# Patient Record
Sex: Female | Born: 1935 | Race: Black or African American | Hispanic: No | Smoking: Never smoker
Health system: Southern US, Community
[De-identification: ages and names within clinical notes are randomized; demographics above are authoritative.]

## PROBLEM LIST (undated history)

## (undated) DIAGNOSIS — I10 Essential (primary) hypertension: Secondary | ICD-10-CM

## (undated) DIAGNOSIS — B9681 Helicobacter pylori [H. pylori] as the cause of diseases classified elsewhere: Secondary | ICD-10-CM

## (undated) DIAGNOSIS — R569 Unspecified convulsions: Secondary | ICD-10-CM

## (undated) DIAGNOSIS — S72002A Fracture of unspecified part of neck of left femur, initial encounter for closed fracture: Secondary | ICD-10-CM

## (undated) DIAGNOSIS — K2970 Gastritis, unspecified, without bleeding: Secondary | ICD-10-CM

## (undated) DIAGNOSIS — K219 Gastro-esophageal reflux disease without esophagitis: Secondary | ICD-10-CM

## (undated) DIAGNOSIS — K5790 Diverticulosis of intestine, part unspecified, without perforation or abscess without bleeding: Secondary | ICD-10-CM

## (undated) DIAGNOSIS — G2581 Restless legs syndrome: Secondary | ICD-10-CM

## (undated) DIAGNOSIS — E785 Hyperlipidemia, unspecified: Secondary | ICD-10-CM

## (undated) DIAGNOSIS — Q394 Esophageal web: Secondary | ICD-10-CM

## (undated) HISTORY — DX: Essential (primary) hypertension: I10

## (undated) HISTORY — DX: Gastro-esophageal reflux disease without esophagitis: K21.9

## (undated) HISTORY — DX: Unspecified convulsions: R56.9

## (undated) HISTORY — DX: Diverticulosis of intestine, part unspecified, without perforation or abscess without bleeding: K57.90

## (undated) HISTORY — DX: Restless legs syndrome: G25.81

## (undated) HISTORY — DX: Hyperlipidemia, unspecified: E78.5

## (undated) HISTORY — DX: Esophageal web: Q39.4

## (undated) HISTORY — PX: ABDOMINAL HYSTERECTOMY: SHX81

## (undated) HISTORY — DX: Gastritis, unspecified, without bleeding: K29.70

## (undated) HISTORY — DX: Helicobacter pylori (H. pylori) as the cause of diseases classified elsewhere: B96.81

---

## 1964-10-08 HISTORY — PX: VESICOVAGINAL FISTULA CLOSURE W/ TAH: SUR271

## 2001-03-03 ENCOUNTER — Encounter: Payer: Self-pay | Admitting: Family Medicine

## 2001-03-03 ENCOUNTER — Ambulatory Visit (HOSPITAL_COMMUNITY): Admission: RE | Admit: 2001-03-03 | Discharge: 2001-03-03 | Payer: Self-pay | Admitting: Family Medicine

## 2001-03-06 ENCOUNTER — Other Ambulatory Visit: Admission: RE | Admit: 2001-03-06 | Discharge: 2001-03-06 | Payer: Self-pay | Admitting: Obstetrics and Gynecology

## 2002-03-06 ENCOUNTER — Encounter: Payer: Self-pay | Admitting: Internal Medicine

## 2002-03-06 ENCOUNTER — Ambulatory Visit (HOSPITAL_COMMUNITY): Admission: RE | Admit: 2002-03-06 | Discharge: 2002-03-06 | Payer: Self-pay | Admitting: Internal Medicine

## 2003-03-12 ENCOUNTER — Ambulatory Visit (HOSPITAL_COMMUNITY): Admission: RE | Admit: 2003-03-12 | Discharge: 2003-03-12 | Payer: Self-pay | Admitting: Internal Medicine

## 2003-03-12 ENCOUNTER — Encounter: Payer: Self-pay | Admitting: Internal Medicine

## 2004-02-02 ENCOUNTER — Ambulatory Visit (HOSPITAL_COMMUNITY): Admission: RE | Admit: 2004-02-02 | Discharge: 2004-02-02 | Payer: Self-pay | Admitting: Internal Medicine

## 2004-03-13 ENCOUNTER — Ambulatory Visit (HOSPITAL_COMMUNITY): Admission: RE | Admit: 2004-03-13 | Discharge: 2004-03-13 | Payer: Self-pay | Admitting: Internal Medicine

## 2005-04-20 ENCOUNTER — Ambulatory Visit (HOSPITAL_COMMUNITY): Admission: RE | Admit: 2005-04-20 | Discharge: 2005-04-20 | Payer: Self-pay | Admitting: Internal Medicine

## 2005-09-06 ENCOUNTER — Ambulatory Visit (HOSPITAL_COMMUNITY): Admission: RE | Admit: 2005-09-06 | Discharge: 2005-09-06 | Payer: Self-pay | Admitting: Internal Medicine

## 2006-04-26 ENCOUNTER — Ambulatory Visit (HOSPITAL_COMMUNITY): Admission: RE | Admit: 2006-04-26 | Discharge: 2006-04-26 | Payer: Self-pay | Admitting: Internal Medicine

## 2007-05-02 ENCOUNTER — Ambulatory Visit (HOSPITAL_COMMUNITY): Admission: RE | Admit: 2007-05-02 | Discharge: 2007-05-02 | Payer: Self-pay | Admitting: Internal Medicine

## 2007-10-09 ENCOUNTER — Encounter: Payer: Self-pay | Admitting: Family Medicine

## 2008-05-07 ENCOUNTER — Ambulatory Visit (HOSPITAL_COMMUNITY): Admission: RE | Admit: 2008-05-07 | Discharge: 2008-05-07 | Payer: Self-pay | Admitting: Internal Medicine

## 2008-10-08 DIAGNOSIS — R569 Unspecified convulsions: Secondary | ICD-10-CM

## 2008-10-08 DIAGNOSIS — B9681 Helicobacter pylori [H. pylori] as the cause of diseases classified elsewhere: Secondary | ICD-10-CM

## 2008-10-08 DIAGNOSIS — Q394 Esophageal web: Secondary | ICD-10-CM

## 2008-10-08 DIAGNOSIS — K2970 Gastritis, unspecified, without bleeding: Secondary | ICD-10-CM

## 2008-10-08 HISTORY — DX: Gastritis, unspecified, without bleeding: K29.70

## 2008-10-08 HISTORY — DX: Helicobacter pylori (H. pylori) as the cause of diseases classified elsewhere: B96.81

## 2008-10-08 HISTORY — DX: Unspecified convulsions: R56.9

## 2008-10-08 HISTORY — DX: Esophageal web: Q39.4

## 2008-10-13 ENCOUNTER — Emergency Department (HOSPITAL_COMMUNITY): Admission: EM | Admit: 2008-10-13 | Discharge: 2008-10-13 | Payer: Self-pay | Admitting: Emergency Medicine

## 2008-11-05 ENCOUNTER — Encounter (INDEPENDENT_AMBULATORY_CARE_PROVIDER_SITE_OTHER): Payer: Self-pay | Admitting: Internal Medicine

## 2008-11-05 ENCOUNTER — Ambulatory Visit (HOSPITAL_COMMUNITY): Admission: RE | Admit: 2008-11-05 | Discharge: 2008-11-05 | Payer: Self-pay | Admitting: Internal Medicine

## 2008-11-05 ENCOUNTER — Ambulatory Visit: Payer: Self-pay | Admitting: Cardiology

## 2008-11-08 ENCOUNTER — Ambulatory Visit (HOSPITAL_COMMUNITY): Admission: RE | Admit: 2008-11-08 | Discharge: 2008-11-08 | Payer: Self-pay | Admitting: Internal Medicine

## 2008-11-10 ENCOUNTER — Ambulatory Visit (HOSPITAL_COMMUNITY): Admission: RE | Admit: 2008-11-10 | Discharge: 2008-11-10 | Payer: Self-pay | Admitting: Preventative Medicine

## 2009-01-05 ENCOUNTER — Emergency Department (HOSPITAL_COMMUNITY): Admission: EM | Admit: 2009-01-05 | Discharge: 2009-01-05 | Payer: Self-pay | Admitting: Emergency Medicine

## 2009-01-24 ENCOUNTER — Emergency Department (HOSPITAL_COMMUNITY): Admission: EM | Admit: 2009-01-24 | Discharge: 2009-01-24 | Payer: Self-pay | Admitting: Emergency Medicine

## 2009-03-09 ENCOUNTER — Emergency Department (HOSPITAL_COMMUNITY): Admission: EM | Admit: 2009-03-09 | Discharge: 2009-03-09 | Payer: Self-pay | Admitting: Emergency Medicine

## 2009-03-10 ENCOUNTER — Encounter (INDEPENDENT_AMBULATORY_CARE_PROVIDER_SITE_OTHER): Payer: Self-pay | Admitting: *Deleted

## 2009-05-03 ENCOUNTER — Ambulatory Visit: Payer: Self-pay | Admitting: Gastroenterology

## 2009-05-08 HISTORY — PX: UPPER GASTROINTESTINAL ENDOSCOPY: SHX188

## 2009-05-13 ENCOUNTER — Encounter: Payer: Self-pay | Admitting: Gastroenterology

## 2009-05-13 ENCOUNTER — Ambulatory Visit: Payer: Self-pay | Admitting: Gastroenterology

## 2009-05-13 ENCOUNTER — Ambulatory Visit (HOSPITAL_COMMUNITY): Admission: RE | Admit: 2009-05-13 | Discharge: 2009-05-13 | Payer: Self-pay | Admitting: Gastroenterology

## 2009-05-25 ENCOUNTER — Encounter: Payer: Self-pay | Admitting: Family Medicine

## 2009-05-26 ENCOUNTER — Telehealth: Payer: Self-pay | Admitting: Gastroenterology

## 2009-06-01 ENCOUNTER — Encounter: Payer: Self-pay | Admitting: Family Medicine

## 2009-06-01 ENCOUNTER — Ambulatory Visit (HOSPITAL_COMMUNITY): Admission: RE | Admit: 2009-06-01 | Discharge: 2009-06-01 | Payer: Self-pay | Admitting: Internal Medicine

## 2009-06-06 ENCOUNTER — Telehealth (INDEPENDENT_AMBULATORY_CARE_PROVIDER_SITE_OTHER): Payer: Self-pay

## 2009-06-09 ENCOUNTER — Ambulatory Visit: Payer: Self-pay | Admitting: Family Medicine

## 2009-06-09 DIAGNOSIS — R5383 Other fatigue: Secondary | ICD-10-CM

## 2009-06-09 DIAGNOSIS — F418 Other specified anxiety disorders: Secondary | ICD-10-CM | POA: Insufficient documentation

## 2009-06-09 DIAGNOSIS — R5381 Other malaise: Secondary | ICD-10-CM | POA: Insufficient documentation

## 2009-06-09 DIAGNOSIS — E785 Hyperlipidemia, unspecified: Secondary | ICD-10-CM | POA: Insufficient documentation

## 2009-06-09 DIAGNOSIS — I10 Essential (primary) hypertension: Secondary | ICD-10-CM | POA: Insufficient documentation

## 2009-06-11 DIAGNOSIS — G40909 Epilepsy, unspecified, not intractable, without status epilepticus: Secondary | ICD-10-CM | POA: Insufficient documentation

## 2009-06-14 ENCOUNTER — Encounter: Payer: Self-pay | Admitting: Family Medicine

## 2009-06-14 LAB — CONVERTED CEMR LAB
ALT: 15 units/L (ref 0–35)
AST: 21 units/L (ref 0–37)
Albumin: 4.2 g/dL (ref 3.5–5.2)
BUN: 14 mg/dL (ref 6–23)
Basophils Absolute: 0 10*3/uL (ref 0.0–0.1)
Basophils Relative: 1 % (ref 0–1)
Bilirubin, Direct: 0.1 mg/dL (ref 0.0–0.3)
Creatinine, Ser: 0.67 mg/dL (ref 0.40–1.20)
Glucose, Bld: 93 mg/dL (ref 70–99)
HDL: 57 mg/dL (ref >39)
Indirect Bilirubin: 0.3 mg/dL (ref 0.0–0.9)
LDL Cholesterol: 135 mg/dL — ABNORMAL HIGH (ref 0–99)
Lymphs Abs: 2 10*3/uL (ref 0.7–4.0)
MCHC: 32 g/dL (ref 30.0–36.0)
Platelets: 210 10*3/uL (ref 150–400)
Potassium: 4.5 meq/L (ref 3.5–5.3)
RBC: 4.44 M/uL (ref 3.87–5.11)
RDW: 15 % (ref 11.5–15.5)
Total Bilirubin: 0.4 mg/dL (ref 0.3–1.2)
Total CHOL/HDL Ratio: 3.6
WBC: 5.1 10*3/uL (ref 4.0–10.5)

## 2009-06-16 ENCOUNTER — Telehealth: Payer: Self-pay | Admitting: Family Medicine

## 2009-07-05 ENCOUNTER — Encounter: Payer: Self-pay | Admitting: Family Medicine

## 2009-07-07 ENCOUNTER — Encounter: Payer: Self-pay | Admitting: Family Medicine

## 2009-07-08 HISTORY — PX: COLONOSCOPY: SHX174

## 2009-07-13 ENCOUNTER — Ambulatory Visit: Payer: Self-pay | Admitting: Gastroenterology

## 2009-07-13 DIAGNOSIS — K5909 Other constipation: Secondary | ICD-10-CM | POA: Insufficient documentation

## 2009-07-13 DIAGNOSIS — K2990 Gastroduodenitis, unspecified, without bleeding: Secondary | ICD-10-CM

## 2009-07-13 DIAGNOSIS — K2970 Gastritis, unspecified, without bleeding: Secondary | ICD-10-CM | POA: Insufficient documentation

## 2009-07-20 ENCOUNTER — Ambulatory Visit: Payer: Self-pay | Admitting: Family Medicine

## 2009-07-20 ENCOUNTER — Encounter: Payer: Self-pay | Admitting: Family Medicine

## 2009-07-20 ENCOUNTER — Other Ambulatory Visit: Admission: RE | Admit: 2009-07-20 | Discharge: 2009-07-20 | Payer: Self-pay | Admitting: Family Medicine

## 2009-07-21 ENCOUNTER — Encounter: Payer: Self-pay | Admitting: Family Medicine

## 2009-07-25 ENCOUNTER — Ambulatory Visit (HOSPITAL_COMMUNITY): Admission: RE | Admit: 2009-07-25 | Discharge: 2009-07-25 | Payer: Self-pay | Admitting: Gastroenterology

## 2009-07-25 ENCOUNTER — Ambulatory Visit: Payer: Self-pay | Admitting: Gastroenterology

## 2009-07-25 ENCOUNTER — Encounter: Payer: Self-pay | Admitting: Gastroenterology

## 2009-07-27 ENCOUNTER — Ambulatory Visit (HOSPITAL_COMMUNITY): Admission: RE | Admit: 2009-07-27 | Discharge: 2009-07-27 | Payer: Self-pay | Admitting: Family Medicine

## 2009-08-10 ENCOUNTER — Encounter: Payer: Self-pay | Admitting: Family Medicine

## 2009-10-21 ENCOUNTER — Ambulatory Visit: Payer: Self-pay | Admitting: Family Medicine

## 2009-10-21 DIAGNOSIS — L259 Unspecified contact dermatitis, unspecified cause: Secondary | ICD-10-CM | POA: Insufficient documentation

## 2009-11-23 ENCOUNTER — Ambulatory Visit: Payer: Self-pay | Admitting: Family Medicine

## 2009-11-28 ENCOUNTER — Encounter: Payer: Self-pay | Admitting: Family Medicine

## 2009-11-28 LAB — CONVERTED CEMR LAB
Albumin: 4.3 g/dL (ref 3.5–5.2)
Alkaline Phosphatase: 55 units/L (ref 39–117)
BUN: 16 mg/dL (ref 6–23)
Bilirubin, Direct: 0.1 mg/dL (ref 0.0–0.3)
CO2: 29 meq/L (ref 19–32)
LDL Cholesterol: 77 mg/dL (ref 0–99)
Potassium: 3.8 meq/L (ref 3.5–5.3)
Total Bilirubin: 0.4 mg/dL (ref 0.3–1.2)
Total CHOL/HDL Ratio: 2.5
Triglycerides: 38 mg/dL (ref <150)

## 2009-12-01 ENCOUNTER — Telehealth: Payer: Self-pay | Admitting: Family Medicine

## 2010-01-02 ENCOUNTER — Telehealth: Payer: Self-pay | Admitting: Family Medicine

## 2010-01-11 ENCOUNTER — Ambulatory Visit: Payer: Self-pay | Admitting: Gastroenterology

## 2010-01-13 DIAGNOSIS — K219 Gastro-esophageal reflux disease without esophagitis: Secondary | ICD-10-CM | POA: Insufficient documentation

## 2010-03-10 ENCOUNTER — Telehealth: Payer: Self-pay | Admitting: Family Medicine

## 2010-03-29 ENCOUNTER — Ambulatory Visit: Payer: Self-pay | Admitting: Family Medicine

## 2010-06-02 ENCOUNTER — Ambulatory Visit (HOSPITAL_COMMUNITY)
Admission: RE | Admit: 2010-06-02 | Discharge: 2010-06-02 | Payer: Self-pay | Source: Home / Self Care | Admitting: *Deleted

## 2010-06-05 ENCOUNTER — Telehealth: Payer: Self-pay | Admitting: Family Medicine

## 2010-06-06 ENCOUNTER — Telehealth: Payer: Self-pay | Admitting: Family Medicine

## 2010-06-14 ENCOUNTER — Ambulatory Visit: Payer: Self-pay | Admitting: Family Medicine

## 2010-06-22 ENCOUNTER — Ambulatory Visit: Payer: Self-pay | Admitting: Family Medicine

## 2010-07-05 ENCOUNTER — Telehealth: Payer: Self-pay | Admitting: Family Medicine

## 2010-08-01 ENCOUNTER — Telehealth: Payer: Self-pay | Admitting: Family Medicine

## 2010-08-02 ENCOUNTER — Telehealth: Payer: Self-pay | Admitting: Family Medicine

## 2010-08-03 ENCOUNTER — Telehealth: Payer: Self-pay | Admitting: Family Medicine

## 2010-08-03 ENCOUNTER — Ambulatory Visit: Payer: Self-pay | Admitting: Family Medicine

## 2010-08-03 DIAGNOSIS — F429 Obsessive-compulsive disorder, unspecified: Secondary | ICD-10-CM | POA: Insufficient documentation

## 2010-08-03 DIAGNOSIS — G4700 Insomnia, unspecified: Secondary | ICD-10-CM | POA: Insufficient documentation

## 2010-08-23 ENCOUNTER — Ambulatory Visit (HOSPITAL_COMMUNITY): Payer: Self-pay | Admitting: Psychiatry

## 2010-08-28 ENCOUNTER — Telehealth: Payer: Self-pay | Admitting: Family Medicine

## 2010-08-29 ENCOUNTER — Ambulatory Visit: Payer: Self-pay | Admitting: Family Medicine

## 2010-08-29 ENCOUNTER — Telehealth: Payer: Self-pay | Admitting: Family Medicine

## 2010-09-06 ENCOUNTER — Ambulatory Visit (HOSPITAL_COMMUNITY): Payer: Self-pay | Admitting: Psychiatry

## 2010-09-20 ENCOUNTER — Ambulatory Visit (HOSPITAL_COMMUNITY): Payer: Self-pay | Admitting: Psychiatry

## 2010-10-06 ENCOUNTER — Ambulatory Visit (HOSPITAL_COMMUNITY): Payer: Self-pay | Admitting: Psychiatry

## 2010-10-20 ENCOUNTER — Ambulatory Visit (HOSPITAL_COMMUNITY)
Admission: RE | Admit: 2010-10-20 | Discharge: 2010-10-20 | Payer: Self-pay | Source: Home / Self Care | Attending: Psychiatry | Admitting: Psychiatry

## 2010-10-25 ENCOUNTER — Telehealth: Payer: Self-pay | Admitting: Family Medicine

## 2010-10-25 ENCOUNTER — Ambulatory Visit
Admission: RE | Admit: 2010-10-25 | Discharge: 2010-10-25 | Payer: Self-pay | Source: Home / Self Care | Attending: Family Medicine | Admitting: Family Medicine

## 2010-10-30 ENCOUNTER — Encounter: Payer: Self-pay | Admitting: General Surgery

## 2010-11-03 ENCOUNTER — Ambulatory Visit (HOSPITAL_COMMUNITY)
Admission: RE | Admit: 2010-11-03 | Discharge: 2010-11-03 | Payer: Self-pay | Source: Home / Self Care | Attending: Psychiatry | Admitting: Psychiatry

## 2010-11-03 LAB — CONVERTED CEMR LAB
AST: 24 units/L (ref 0–37)
Alkaline Phosphatase: 67 units/L (ref 39–117)
Calcium: 9.4 mg/dL (ref 8.4–10.5)
Cholesterol: 146 mg/dL (ref 0–200)
Eosinophils Absolute: 0 10*3/uL (ref 0.0–0.7)
Hemoglobin: 12.8 g/dL (ref 12.0–15.0)
Indirect Bilirubin: 0.4 mg/dL (ref 0.0–0.9)
Lymphocytes Relative: 25 % (ref 12–46)
Lymphs Abs: 1.7 10*3/uL (ref 0.7–4.0)
MCHC: 32.6 g/dL (ref 30.0–36.0)
Monocytes Relative: 8 % (ref 3–12)
RDW: 14.4 % (ref 11.5–15.5)
Total CHOL/HDL Ratio: 2.2
WBC: 6.9 10*3/uL (ref 4.0–10.5)

## 2010-11-05 DIAGNOSIS — M25569 Pain in unspecified knee: Secondary | ICD-10-CM | POA: Insufficient documentation

## 2010-11-07 ENCOUNTER — Ambulatory Visit (HOSPITAL_COMMUNITY): Admit: 2010-11-07 | Payer: Self-pay | Admitting: Psychiatry

## 2010-11-07 NOTE — Letter (Signed)
Summary: consults  consults   Imported By: Lind Guest 02/22/2010 16:35:33  _____________________________________________________________________  External Attachment:    Type:   Image     Comment:   External Document

## 2010-11-07 NOTE — Progress Notes (Signed)
Summary: MED  Phone Note Call from Patient   Summary of Call: HAS THROWED HER [facility] MEDICINE IN THE [place] CALL BACK AT [phone] Initial call taken by: Lind Guest,  August 02, 2010 8:20 AM  Follow-up for Phone Call        patient states that when she called [place] she also called the drug store and they sent her some more medication out. Follow-up by: Curtis Sites,  August 02, 2010 11:26 AM  Additional Follow-up for Phone Call Additional follow up Details #1::        noted Additional Follow-up by: Adella Hare LPN,  August 02, 2010 11:32 AM

## 2010-11-07 NOTE — Assessment & Plan Note (Signed)
Summary: bp ck  Nurse Visit   Vital Signs:  Patient profile:   77 year old female Menstrual status:  hysterectomy Weight:      174.75 pounds BP sitting:   124 / 80  (left arm)  Vitals Entered By: Adella Hare LPN (August 29, 2010 9:27 AM)  Allergies: No Known Drug Allergies pt reassured of normal BP

## 2010-11-07 NOTE — Progress Notes (Signed)
Summary: refill  Phone Note Call from Patient   Summary of Call: needs a refill on xanax [facility] [phone] Initial call taken by: Rudene Anda,  June 05, 2010 8:35 AM  Follow-up for Phone Call        Rx Called In Follow-up by: Adella Hare LPN,  June 05, 2010 8:45 AM    Prescriptions: ALPRAZOLAM 0.25 MG TABS (ALPRAZOLAM) once daily as needed  #30 x 1   Entered by:   Adella Hare LPN   Authorized by:   Syliva Overman MD   Signed by:   Adella Hare LPN on 45/40/9811   Method used:   Printed then faxed to ...       [facility] (retail)       [address]       [place], [place]  [address]       Ph: [phone]       Fax: [phone]   RxID:   [id number]

## 2010-11-07 NOTE — Progress Notes (Signed)
Summary: lovastatin  Phone Note Call from Patient   Summary of Call: patient would like her Lovastatin 40 mg called into [place], she will be out of this medication today.  thanks Initial call taken by: Curtis Sites,  August 28, 2010 2:39 PM  Follow-up for Phone Call        Rx Called In Follow-up by: Adella Hare LPN,  August 29, 2010 2:44 PM

## 2010-11-07 NOTE — Assessment & Plan Note (Signed)
Summary: RASH   Vital Signs:  Patient profile:   77 year old female Menstrual status:  hysterectomy Height:      69 inches Weight:      180.50 pounds BMI:     26.75 O2 Sat:      97 % Pulse rate:   94 / minute Pulse rhythm:   regular Resp:     16 per minute BP sitting:   120 / 80 Cuff size:   regular  Vitals Entered By: Everitt Amber (October 21, 2009 9:50 AM)  Nutrition Counseling: Patient's BMI is greater than 25 and therefore counseled on weight management options. CC: noticed a red dry rash lastweek under her left arm, not itching but looks like its spreading   Primary Care Provider:  Lodema Hong, M.D.  CC:  noticed a red dry rash lastweek under her left arm and not itching but looks like its spreading.  History of Present Illness: Non  puritic rash under left arm for the pasrt 1 week. No change in deodorant or soap. No other skin problem noted No fever or chills. otherwise she has no complaints and reports that she has been doing really well.  Denies sinus pressure, nasal congestion , ear pain or sore throat. Denies chest congestion, or cough productive of sputum. Denies chest pain, palpitations, PND, orthopnea or leg swelling. Denies abdominal pain, nausea, vomitting, diarrhea or constipation. Denies change in bowel movements or bloody stool. Denies dysuria , frequency, incontinence or hesitancy. Reports chronic  joint pain, and  reduced mobility. Denies headaches, vertigo, seizures. Denies depression, anxiety or insomnia.improved on meds.    Current Medications (verified): 1)  Alprazolam 0.25 Mg Tabs (Alprazolam) .... Once Daily As Needed 2)  Diltiazem Hcl 120 Mg Tabs (Diltiazem Hcl) .... Once Daily 3)  Aspirin 81 Mg Tbec (Aspirin) .... Once Daily 4)  Vitamin D 50,000 Units .... Q Week 5)  Levetiracetam 500 Mg Tabs (Levetiracetam) .... Two Times A Day 6)  Omeprazole 20 Mg Tbec (Omeprazole) .... One By Mouth Daily 7)  Fluoxetine Hcl 10 Mg Caps (Fluoxetine Hcl) ....  Take 1 Capsule By Mouth Once A Day 8)  Lovastatin 40 Mg Tabs (Lovastatin) .... Take Two Tabs By Mouth Qhs 9)  Oscal 500/200 D-3 500-200 Mg-Unit Tabs (Calcium-Vitamin D) .... One Tab By Mouth Tid 10)  Multivitamins  Tabs (Multiple Vitamin) .... One Tab By Mouth Qd  Allergies (verified): No Known Drug Allergies  Review of Systems      See HPI MS:  Complains of joint pain and stiffness; ambulates with a cane. Neuro:  Denies falling down, headaches, seizures, and sensation of room spinning. Heme:  Denies abnormal bruising and bleeding. Allergy:  Denies hives or rash and sneezing.  Physical Exam  General:  Well-developed,well-nourished,in no acute distress; alert,appropriate and cooperative throughout examination HEENT: No facial asymmetry,  EOMI, No sinus tenderness, TM's Clear, oropharynx  pink and moist.   Chest: Clear to auscultation bilaterally.  CVS: S1, S2, No murmurs, No S3.   Abd: Soft, Nontender.  MS: decreased ROM spine, hips, shoulders and knees. Ambulates with cane Ext: No edema.   CNS: CN 2-12 intact,decreased  powe and  tonein lower ext  Skin: Intact,macular scaly rash in left axilla, hypopigmented Psych: Good eye contact, normal affect.  Memory intact, not anxious or depressed appearing.    Impression & Recommendations:  Problem # 1:  DERMATITIS (ICD-692.9) Assessment Comment Only betamethasone/clotrimazole twice daily, and fluconazole  and terbinaine prescribed  Problem # 2:  SEIZURE DISORDER (  ICD-780.39) Assessment: Improved  Her updated medication list for this problem includes:    Levetiracetam 500 Mg Tabs (Levetiracetam) .Marland Kitchen..Marland Kitchen Two times a day  Problem # 3:  DEPRESSION (ICD-311) Assessment: Improved  Her updated medication list for this problem includes:    Alprazolam 0.25 Mg Tabs (Alprazolam) ..... Once daily as needed    Fluoxetine Hcl 10 Mg Caps (Fluoxetine hcl) .Marland Kitchen... Take 1 capsule by mouth once a day  Problem # 4:  ESSENTIAL HYPERTENSION  (ICD-401.9) Assessment: Improved  Her updated medication list for this problem includes:    Diltiazem Hcl 120 Mg Tabs (Diltiazem hcl) ..... Once daily  BP today: 120/80 Prior BP: 140/80 (07/20/2009)  Labs Reviewed: K+: 4.5 (06/09/2009) Creat: : 0.67 (06/09/2009)   Chol: 205 (06/09/2009)   HDL: 57 (06/09/2009)   LDL: 135 (06/09/2009)   TG: 67 (06/09/2009)  Complete Medication List: 1)  Alprazolam 0.25 Mg Tabs (Alprazolam) .... Once daily as needed 2)  Diltiazem Hcl 120 Mg Tabs (Diltiazem hcl) .... Once daily 3)  Aspirin 81 Mg Tbec (Aspirin) .... Once daily 4)  Vitamin D 50,000 Units  .... Q week 5)  Levetiracetam 500 Mg Tabs (Levetiracetam) .... Two times a day 6)  Omeprazole 20 Mg Tbec (Omeprazole) .... One by mouth daily 7)  Fluoxetine Hcl 10 Mg Caps (Fluoxetine hcl) .... Take 1 capsule by mouth once a day 8)  Lovastatin 40 Mg Tabs (Lovastatin) .... Take two tabs by mouth qhs 9)  Oscal 500/200 D-3 500-200 Mg-unit Tabs (Calcium-vitamin d) .... One tab by mouth tid 10)  Multivitamins Tabs (Multiple vitamin) .... One tab by mouth qd 11)  Clotrimazole-betamethasone 1-0.05 % Crea (Clotrimazole-betamethasone) .... Apply to rash in left armpit twice daily for 2 weeks , then as needed 12)  Terbinafine Hcl 250 Mg Tabs (Terbinafine hcl) .... Take 1 tablet by mouth once a day 13)  Fluconazole 150 Mg Tabs (Fluconazole) .... Take 1 tablet by mouth once a day  Patient Instructions: 1)  F/U as before. 2)  You are being treated for ecczema and skin infection. Pls continue to wash with Lever 2000, andonly use the prescribed cream on the rash. 3)  Tablets are also sent in.  Prescriptions: FLUOXETINE HCL 10 MG CAPS (FLUOXETINE HCL) Take 1 capsule by mouth once a day  #30 x 3   Entered by:   Everitt Amber   Authorized by:   Syliva Overman MD   Signed by:   Everitt Amber on 10/21/2009   Method used:   Electronically to        [facility] (retail)       [address] [place], [place]  [address]       Ph: [phone]       Fax: [phone]   RxID:   [id number] FLUCONAZOLE 150 MG TABS (FLUCONAZOLE) Take 1 tablet by mouth once a day  #3 x 0   Entered by:   Everitt Amber   Authorized by:   Syliva Overman MD   Signed by:   Everitt Amber on 10/21/2009   Method used:   Electronically to        [facility] (retail)       [address] [place], [place]  [address]       Ph: [phone]       Fax:  [phone]   RxID:   [id number] TERBINAFINE HCL 250 MG TABS (TERBINAFINE HCL) Take 1 tablet by mouth once a day  #14 x 0   Entered by:   Everitt Amber   Authorized by:   Syliva Overman MD   Signed by:   Everitt Amber on 10/21/2009   Method used:   Electronically to        [facility] (retail)       [address]       [place], [place]  [address]       Ph: [phone]       Fax: [phone]   RxID:   [id number] CLOTRIMAZOLE-BETAMETHASONE 1-0.05 % CREA (CLOTRIMAZOLE-BETAMETHASONE) apply to rash in left armpit twice daily for 2 weeks , then as needed  #45gm x 0   Entered by:   Everitt Amber   Authorized by:   Syliva Overman MD   Signed by:   Everitt Amber on 10/21/2009   Method used:   Electronically to        [facility] (retail)       [address] [place], [place]  [address]       Ph: [phone]       Fax: [phone]   RxID:   [id number]

## 2010-11-07 NOTE — Assessment & Plan Note (Signed)
Summary: Constipation, Gastritis   Visit Type:  Follow-up Visit Primary Care Provider:  Lodema Hong, M.D.   Chief Complaint:  follow up- doing ok.  History of Present Illness: Taking Miralax and fiber. BMs: just about every day or every other day. Pt can eat anything she wants.   Current Medications (verified): 1)  Alprazolam 0.25 Mg Tabs (Alprazolam) .... Once Daily As Needed 2)  Diltiazem Hcl 120 Mg Tabs (Diltiazem Hcl) .... Once Daily 3)  Aspirin 81 Mg Tbec (Aspirin) .... Once Daily 4)  Vitamin D 50,000 Units .... Q Week 5)  Levetiracetam 500 Mg Tabs (Levetiracetam) .... Two Times A Day 6)  Omeprazole 20 Mg Tbec (Omeprazole) .... One By Mouth Daily 7)  Fluoxetine Hcl 10 Mg Caps (Fluoxetine Hcl) .... Take 1 Capsule By Mouth Once A Day 8)  Lovastatin 40 Mg Tabs (Lovastatin) .... Take Two Tabs By Mouth Qhs 9)  Oscal 500/200 D-3 500-200 Mg-Unit Tabs (Calcium-Vitamin D) .... One Tab By Mouth Tid 10)  Multivitamins  Tabs (Multiple Vitamin) .... One Tab By Mouth Qd  Allergies (verified): No Known Drug Allergies  Past History:  Past Medical History: Last updated: 07/13/2009 Hyperlipidemia   approx 2001 Hypertension  proir to 1990 Seizures: since 2010 after falling in January, was running into the house to turn off te stove, fell and hit head since  then she developed seizures, followed by Dr. Gerilyn Pilgrim for this Restless legs syndrome  since 1999 GERD EGD/dilation to 16 mm AUG 2010 H. PYLORI GASTRITIS 2010, RX: ABO for 10 days  Vital Signs:  Patient profile:   77 year old female Menstrual status:  hysterectomy Height:      69 inches Weight:      179 pounds BMI:     26.53 Temp:     98.0 degrees F oral Pulse rate:   72 / minute BP sitting:   138 / 78  (left arm) Cuff size:   regular  Vitals Entered By: Hendricks Limes LPN (January 11, 1609 2:44 PM)  Physical Exam  General:  Well developed, well nourished, no acute distress. Head:  Normocephalic and atraumatic. Lungs:  Clear  throughout to auscultation. Heart:  Regular rate and rhythm; no murmurs. Abdomen:  Soft, nontender and nondistended. Normal bowel sounds.  Impression & Recommendations:  Problem # 1:  CONSTIPATION (ICD-564.00) Assessment Improved  CONTINUE FIBER AND WATER. OPV in 12 mos.  Orders: Est. Patient Level II ([address])  Problem # 2:  GASTRITIS (ICD-535.50) Assessment: Improved Continue OMP.  CC: PCP

## 2010-11-07 NOTE — Progress Notes (Signed)
Summary: MEDICATION  Phone Note Call from Patient   Summary of Call: PATIENT CALLED IN REGARDING MEDICATION STATES THAT MEDICATION IS NOT READY PHARMACY IS [facility] NEED TO TAKE IT TONIGHT BEFORE BED TIME.Marland KitchenMEDICATION IS LOVASTATIN 40MG  TABS Initial call taken by: Eugenio Hoes,  August 28, 2010 3:26 PM  Follow-up for Phone Call        Rx Called In Follow-up by: Adella Hare LPN,  August 29, 2010 2:44 PM

## 2010-11-07 NOTE — Letter (Signed)
Summary: labs  labs   Imported By: Lind Guest 02/22/2010 16:36:21  _____________________________________________________________________  External Attachment:    Type:   Image     Comment:   External Document

## 2010-11-07 NOTE — Progress Notes (Signed)
Summary: MUTLI VITAMIN  Phone Note Call from Patient   Summary of Call: NEEDS HER MULITI VITAMIN SENT TO [facility] Initial call taken by: Lind Guest,  August 29, 2010 11:06 AM    Prescriptions: MULTIVITAMINS  TABS (MULTIPLE VITAMIN) one tab by mouth once daily.  #30 x 1   Entered by:   Adella Hare LPN   Authorized by:   Syliva Overman MD   Signed by:   Adella Hare LPN on 19/14/7829   Method used:   Electronically to        [facility] (retail)       [address] [place], [place]  [address]       Ph: [phone]       Fax: [phone]   RxID:   [id number]

## 2010-11-07 NOTE — Progress Notes (Signed)
Summary: MEDICINE  Phone Note Call from Patient   Summary of Call: TAKING PROZAC AND SICK READ THE PAPER FROM THE RX WHICH SAID WEAK CONFUSED  UNSTEADY  MEMORY LOSS DO NOTHING SHOULD SHE KEEP TAKING THEM OR WHAT APPT 11.2.11 CALL BACK AT [phone] Initial call taken by: Lind Guest,  August 01, 2010 2:40 PM  Follow-up for Phone Call        needs sooner appt to eval confusion this week work in the fluoxetine is not new, pls sched and let her know Follow-up by: Syliva Overman MD,  August 02, 2010 12:58 PM  Additional Follow-up for Phone Call Additional follow up Details #1::        SHE HAS TO CALL HER DAUGHTER AND WILL CALL BACK Additional Follow-up by: Lind Guest,  August 02, 2010 4:30 PM    Additional Follow-up for Phone Call Additional follow up Details #2::    noted Follow-up by: Syliva Overman MD,  August 02, 2010 4:32 PM

## 2010-11-07 NOTE — Progress Notes (Signed)
Summary: MEDS  Phone Note Call from Patient   Summary of Call: BEEN WITHOUT MEDS FOR 2 DAYS [facility]. XAXNAX AND BP PILLS [phone] Initial call taken by: Lind Guest,  January 02, 2010 8:56 AM  Follow-up for Phone Call        Sent to [facility] Follow-up by: Everitt Amber LPN,  January 02, 2010 9:34 AM    Prescriptions: DILTIAZEM HCL 120 MG TABS (DILTIAZEM HCL) once daily  #30 x 4   Entered by:   Everitt Amber LPN   Authorized by:   Syliva Overman MD   Signed by:   Everitt Amber LPN on 66/03/3015   Method used:   Printed then faxed to ...       [facility] (retail)       [address]       [place], [place]  [address]       Ph: [phone]       Fax: [phone]   RxID:   [id number] ALPRAZOLAM 0.25 MG TABS (ALPRAZOLAM) once daily as needed  #30 x 4   Entered by:   Everitt Amber LPN   Authorized by:   Syliva Overman MD   Signed by:   Everitt Amber LPN on 62/69/4854   Method used:   Printed then faxed to ...       [facility] (retail)       [address]       [place], [place]  [address]       Ph: [phone]       Fax: [phone]   RxID:   [id number]

## 2010-11-07 NOTE — Assessment & Plan Note (Signed)
Summary: office visit   Vital Signs:  Patient profile:   77 year old female Menstrual status:  hysterectomy Height:      69 inches Weight:      180.13 pounds BMI:     26.70 O2 Sat:      96 % Pulse rate:   73 / minute Pulse rhythm:   regular Resp:     16 per minute BP sitting:   120 / 74  (left arm) Cuff size:   regular  Vitals Entered By: Everitt Amber LPN (March 29, 2010 9:21 AM)  Nutrition Counseling: Patient's BMI is greater than 25 and therefore counseled on weight management options. CC: Follow up chronic problems   Primary Care Provider:  Lodema Hong, M.D.   CC:  Follow up chronic problems.  History of Present Illness: Reports  that she has been doing well. Denies recent fever or chills. Denies sinus pressure, nasal congestion , ear pain or sore throat. Denies chest congestion, or cough productive of sputum. Denies chest pain, palpitations, PND, orthopnea or leg swelling. Denies abdominal pain, nausea, vomitting, diarrhea or constipation. Denies change in bowel movements or bloody stool. Denies dysuria , frequency, incontinence or hesitancy. Chronic  joint pain, and  reduced mobility. Denies headaches, vertigo, seizures. Denies depression, anxiety or insomnia.Reports improvement on meds Denies  rash, lesions, or itch.     Current Medications (verified): 1)  Alprazolam 0.25 Mg Tabs (Alprazolam) .... Once Daily As Needed 2)  Diltiazem Hcl 120 Mg Tabs (Diltiazem Hcl) .... Once Daily 3)  Aspirin 81 Mg Tbec (Aspirin) .... Once Daily 4)  Vitamin D 50,000 Units .... Q Week 5)  Levetiracetam 500 Mg Tabs (Levetiracetam) .... Two Times A Day 6)  Omeprazole 20 Mg Tbec (Omeprazole) .... One By Mouth Daily 7)  Fluoxetine Hcl 10 Mg Caps (Fluoxetine Hcl) .... Take 1 Capsule By Mouth Once A Day 8)  Lovastatin 40 Mg Tabs (Lovastatin) .... Take Two Tabs By Mouth Qhs 9)  Multivitamins  Tabs (Multiple Vitamin) .... One Tab By Mouth Qd 10)  Oscal 500/200 D-3 500-200 Mg-Unit Tabs  (Calcium-Vitamin D) .... Take 1 Tablet By Mouth Three Times A Day  Allergies (verified): No Known Drug Allergies  Review of Systems      See HPI Eyes:  Denies blurring and discharge. Endo:  Denies cold intolerance, excessive hunger, excessive thirst, excessive urination, heat intolerance, polyuria, and weight change. Heme:  Denies abnormal bruising and enlarge lymph nodes. Allergy:  Denies hives or rash and itching eyes.  Physical Exam  General:  Well-developed,well-nourished,in no acute distress; alert,appropriate and cooperative throughout examination HEENT: No facial asymmetry,  EOMI, No sinus tenderness, TM's Clear, oropharynx  pink and moist.   Chest: Clear to auscultation bilaterally.  CVS: S1, S2, No murmurs, No S3.   Abd: Soft, Nontender.  MS: decreased  ROM spine, hips, shoulders and knees.  Ext: No edema.   CNS: CN 2-12 intact, power tone and sensation normal throughout.   Skin: Intact, no visible lesions or rashes.  Psych: Good eye contact, normal affect.  Memory intact, not anxious or depressed appearing.    Impression & Recommendations:  Problem # 1:  GERD (ICD-530.81) Assessment Comment Only  Her updated medication list for this problem includes:    Omeprazole 20 Mg Tbec (Omeprazole) ..... One by mouth daily  Problem # 2:  DEPRESSION (ICD-311) Assessment: Improved  Her updated medication list for this problem includes:    Alprazolam 0.25 Mg Tabs (Alprazolam) ..... Once daily as needed  Fluoxetine Hcl 10 Mg Caps (Fluoxetine hcl) .Marland Kitchen... Take 1 capsule by mouth once a day  Problem # 3:  ESSENTIAL HYPERTENSION (ICD-401.9) Assessment: Improved  Her updated medication list for this problem includes:    Diltiazem Hcl 120 Mg Tabs (Diltiazem hcl) ..... Once daily  Orders: T-Basic Metabolic Panel [phone])  BP today: 120/74 Prior BP: 138/78 (01/11/2010)  Labs Reviewed: K+: 3.8 (11/28/2009) Creat: : 0.85 (11/28/2009)   Chol: 143 (11/28/2009)   HDL: 58  (11/28/2009)   LDL: 77 (11/28/2009)   TG: 38 (11/28/2009)  Problem # 4:  HYPERLIPIDEMIA (ICD-272.4) Assessment: Improved  Her updated medication list for this problem includes:    Lovastatin 40 Mg Tabs (Lovastatin) .Marland Kitchen... Take two tabs by mouth qhs  Orders: T-Hepatic Function [id number]) T-Lipid Profile [phone])  Labs Reviewed: SGOT: 21 (11/28/2009)   SGPT: 16 (11/28/2009)   HDL:58 (11/28/2009), 57 (06/09/2009)  LDL:77 (11/28/2009), 135 (52/84/1324)  Chol:143 (11/28/2009), 205 (06/09/2009)  Trig:38 (11/28/2009), 67 (06/09/2009)  Complete Medication List: 1)  Alprazolam 0.25 Mg Tabs (Alprazolam) .... Once daily as needed 2)  Diltiazem Hcl 120 Mg Tabs (Diltiazem hcl) .... Once daily 3)  Aspirin 81 Mg Tbec (Aspirin) .... Once daily 4)  Vitamin D 50,000 Units  .... Q week 5)  Levetiracetam 500 Mg Tabs (Levetiracetam) .... Two times a day 6)  Omeprazole 20 Mg Tbec (Omeprazole) .... One by mouth daily 7)  Fluoxetine Hcl 10 Mg Caps (Fluoxetine hcl) .... Take 1 capsule by mouth once a day 8)  Lovastatin 40 Mg Tabs (Lovastatin) .... Take two tabs by mouth qhs 9)  Multivitamins Tabs (Multiple vitamin) .... One tab by mouth qd 10)  Oscal 500/200 D-3 500-200 Mg-unit Tabs (Calcium-vitamin d) .... Take 1 tablet by mouth three times a day  Other Orders: T-CBC w/Diff ([id number]) T-TSH ([id number])  Patient Instructions: 1)  Please schedule a follow-up appointment in 4 months.Rectal at that visit 2)  BMP prior to visit, ICD-9: 3)  Hepatic Panel prior to visit, ICD-9: 4)  Lipid Panel prior to visit, ICD-9:    fasting in 4 months 5)  TSH prior to visit, ICD-9: 6)  CBC w/ Diff prior to visit, ICD-9: 7)  [place] due in August pls sched in July. 8)  I am glad thT YOU ARE FEELING BETTER, NO MED CHANGES, KEEP WELL AND KEEP COOL. 9)  pLS CONSIDER SERIOUSLY GETTING THE FLU VACCINE IN THE fALL Prescriptions: LOVASTATIN 40 MG TABS (LOVASTATIN) Take two tabs by mouth qhs  #60 x 4   Entered by:    Everitt Amber LPN   Authorized by:   Syliva Overman MD   Signed by:   Everitt Amber LPN on 95/63/8756   Method used:   Electronically to        [facility] (retail)       [address] [place], [place]  [address]       Ph: [phone]       Fax: [phone]   RxID:   [id number] MULTIVITAMINS  TABS (MULTIPLE VITAMIN) one tab by mouth qd  #30 Each x 4   Entered by:   Everitt Amber LPN   Authorized by:   Syliva Overman MD   Signed by:   Everitt Amber LPN on 37/62/8315   Method used:   Electronically to        [facility] (retail)       [address]  [place], [place]  [address]       Ph: [phone]       Fax: [phone]   RxID:   [id number] FLUOXETINE HCL 10 MG CAPS (FLUOXETINE HCL) Take 1 capsule by mouth once a day  #30 x 4   Entered by:   Everitt Amber LPN   Authorized by:   Syliva Overman MD   Signed by:   Everitt Amber LPN on 09/32/3557   Method used:   Electronically to        [facility] (retail)       [address] [place], [place]  [address]       Ph: [phone]       Fax: [phone]   RxID:   [id number]

## 2010-11-07 NOTE — Progress Notes (Signed)
Summary: refillk  Phone Note Call from Patient   Summary of Call: needs to get a refill on mult vit. [facility] [phone] Initial call taken by: Rudene Anda,  March 10, 2010 9:17 AM    Prescriptions: MULTIVITAMINS  TABS (MULTIPLE VITAMIN) one tab by mouth qd  #30 x 5   Entered by:   Everitt Amber LPN   Authorized by:   Syliva Overman MD   Signed by:   Everitt Amber LPN on 45/40/9811   Method used:   Electronically to        [facility] (retail)       [address] [place], [place]  [address]       Ph: [phone]       Fax: [phone]   RxID:   [id number]

## 2010-11-07 NOTE — Assessment & Plan Note (Signed)
Summary: OV   Vital Signs:  Patient profile:   77 year old female Menstrual status:  hysterectomy Height:      69 inches Weight:      179.25 pounds BMI:     26.57 O2 Sat:      97 % on Room air Pulse rate:   64 / minute Pulse rhythm:   regular Resp:     16 per minute BP sitting:   130 / 80  (left arm)  Vitals Entered By: Mauricia Area CMA (August 03, 2010 10:08 AM)  Nutrition Counseling: Patient's BMI is greater than 25 and therefore counseled on weight management options.  O2 Flow:  Room air CC: Follow up. Feels tired and weak, believes her prozac is making her sick.   Primary Care Provider:  Lodema Cruz, M.D.   CC:  Follow up. Feels tired and weak and believes her prozac is making her sick..  History of Present Illness: Pt in today reportiong weakness, confusion, memory loss, poor sleep and difficulty with concentration, progessively worsening. She is also unsterady when she walks at times.These are all  printed as potential adverse effects  of r fluoxetine and she has concerns as to whehther she should d/c the drug. State she cries every day thanking God, she someimes feels sad, but not often, in other words her tears are reportedly of joy. She does have an OCD d/o which has her taking trash out constantly States the whole complex where she lives says she is crazy. She reports poor sleep.  Current Medications (verified): 1)  Alprazolam 0.25 Mg Tabs (Alprazolam) .... Once Daily As Needed 2)  Diltiazem Hcl 120 Mg Tabs (Diltiazem Hcl) .... Once Daily 3)  Aspirin 81 Mg Tbec (Aspirin) .... Once Daily 4)  Vitamin D 50,000 Units .... Q Week 5)  Levetiracetam 500 Mg Tabs (Levetiracetam) .... Two Times A Day 6)  Omeprazole 20 Mg Tbec (Omeprazole) .... One By Mouth Daily 7)  Fluoxetine Hcl 10 Mg Caps (Fluoxetine Hcl) .... Take 1 Capsule By Mouth Once A Day 8)  Lovastatin 40 Mg Tabs (Lovastatin) .... Take Two Tabs By Mouth At Bedtime. 9)  Multivitamins  Tabs (Multiple Vitamin) ....  One Tab By Mouth Once Daily. 10)  Oyster Shell Calcium/d 500-125 Mg-Unit Tabs (Calcium-Vitamin D) .Marland Kitchen.. 1 Tab By Mouth Three Times A Day  Allergies (verified): No Known Drug Allergies  Review of Systems      See HPI General:  Complains of fatigue, malaise, sleep disorder, and weakness. Eyes:  Denies discharge. ENT:  Denies nasal congestion, postnasal drainage, and sinus pressure. CV:  Denies chest pain or discomfort, palpitations, and swelling of feet. Resp:  Denies cough and sputum productive. GI:  Denies abdominal pain, constipation, diarrhea, nausea, and vomiting. GU:  Denies dysuria and urinary frequency. MS:  Complains of joint pain and stiffness. Neuro:  Complains of poor balance. Psych:  Complains of anxiety, depression, mental problems, and unusual visions or sounds. Endo:  Denies cold intolerance, excessive hunger, excessive thirst, and excessive urination. Heme:  Denies abnormal bruising and bleeding. Allergy:  Denies hives or rash and itching eyes.  Physical Exam  General:  Well-developed,well-nourished,in no acute distress; alert,appropriate and cooperative throughout examination HEENT: No facial asymmetry,  EOMI, No sinus tenderness, TM's Clear, oropharynx  pink and moist.   Chest: Clear to auscultation bilaterally.  CVS: S1, S2, No murmurs, No S3.   Abd: Soft, Nontender.  MS: Adequate ROM spine, hips, shoulders and knees.  Ext: No edema.   CNS:  CN 2-12 intact, power tone and sensation normal throughout.   Skin: Intact, no visible lesions or rashes.  Psych: Good eye contact, flat affect.  Memory loss, both anxious and  depressed appearing.    Impression & Recommendations:  Problem # 1:  OBSESSIVE-COMPULSIVE DISORDER (ICD-300.3) Assessment Deteriorated  Orders: Psychiatric Referral (Psych)  Problem # 2:  INSOMNIA (ICD-780.52) Assessment: Deteriorated  Her updated medication list for this problem includes:    Temazepam 15 Mg Caps (Temazepam) .Marland Kitchen... Take 1  capsule by mouth at bedtime for sleep  Orders: Medicare Electronic Prescription [phone])  Discussed sleep hygiene.   Problem # 3:  GERD (ICD-530.81) Assessment: Unchanged  Her updated medication list for this problem includes:    Omeprazole 20 Mg Tbec (Omeprazole) ..... One by mouth daily  Problem # 4:  DEPRESSION (ICD-311) Assessment: Deteriorated  The following medications were removed from the medication list:    Alprazolam 0.25 Mg Tabs (Alprazolam) ..... Once daily as needed Her updated medication list for this problem includes:    Fluoxetine Hcl 10 Mg Caps (Fluoxetine hcl) .Marland Kitchen... Take 1 capsule by mouth once a day  Orders: Psychiatric Referral (Psych)  Problem # 5:  ESSENTIAL HYPERTENSION (ICD-401.9) Assessment: Unchanged  Her updated medication list for this problem includes:    Diltiazem Hcl 120 Mg Tabs (Diltiazem hcl) ..... Once daily  BP today: 130/80 Prior BP: 144/90 (06/22/2010)  Labs Reviewed: K+: 3.8 (11/28/2009) Creat: : 0.85 (11/28/2009)   Chol: 143 (11/28/2009)   HDL: 58 (11/28/2009)   LDL: 77 (11/28/2009)   TG: 38 (11/28/2009)  Complete Medication List: 1)  Diltiazem Hcl 120 Mg Tabs (Diltiazem hcl) .... Once daily 2)  Aspirin 81 Mg Tbec (Aspirin) .... Once daily 3)  Vitamin D 50,000 Units  .... Q week 4)  Levetiracetam 500 Mg Tabs (Levetiracetam) .... Two times a day 5)  Omeprazole 20 Mg Tbec (Omeprazole) .... One by mouth daily 6)  Fluoxetine Hcl 10 Mg Caps (Fluoxetine hcl) .... Take 1 capsule by mouth once a day 7)  Lovastatin 40 Mg Tabs (Lovastatin) .... Take two tabs by mouth at bedtime. 8)  Multivitamins Tabs (Multiple vitamin) .... One tab by mouth once daily. 9)  Oyster Shell Calcium/d 500-125 Mg-unit Tabs (Calcium-vitamin d) .Marland Kitchen.. 1 tab by mouth three times a day 10)  Temazepam 15 Mg Caps (Temazepam) .... Take 1 capsule by mouth at bedtime for sleep  Patient Instructions: 1)  Please schedule a follow-up appointment in 2.5 months. 2)  I will  refer you to behavior health for assistance with your nerves as soon as possible , I will request a Wednesday appt , since your daughter is available that day. 3)  Continue fluoxetine. 4)  Stop xanax/alprazolam, instead start tamazepam for sleep. Prescriptions: FLUOXETINE HCL 10 MG CAPS (FLUOXETINE HCL) Take 1 capsule by mouth once a day  #30 x 4   Entered by:   Mauricia Area CMA   Authorized by:   Syliva Overman MD   Signed by:   Mauricia Area CMA on 08/03/2010   Method used:   Printed then faxed to ...       [facility] (retail)       [address]       [place], [place]  [address]       Ph: [phone]       Fax: [phone]   RxID:   [id number] LEVETIRACETAM 500 MG TABS (LEVETIRACETAM) two times a day  #  60 x 4   Entered by:   Mauricia Area CMA   Authorized by:   Syliva Overman MD   Signed by:   Mauricia Area CMA on 08/03/2010   Method used:   Printed then faxed to ...       [facility] (retail)       [address]       [place], [place]  [address]       Ph: [phone]       Fax: [phone]   RxID:   [id number] TEMAZEPAM 15 MG CAPS (TEMAZEPAM) Take 1 capsule by mouth at bedtime for sleep  #30 x 2   Entered and Authorized by:   Syliva Overman MD   Signed by:   Syliva Overman MD on 08/03/2010   Method used:   Printed then faxed to ...       [facility] (retail)       [address]       [place], [place]  [address]       Ph: [phone]       Fax: [phone]   RxID:   [id number]    Orders Added: 1)  Est. Patient Level IV [10175] 2)  Medicare Electronic Prescription [G8553] 3)  Psychiatric Referral [Psych]

## 2010-11-07 NOTE — Progress Notes (Signed)
Summary: refill  Phone Note Call from Patient   Summary of Call: pts blood pressure meds doesn't have a refill on it and she needs one. [facility]  [phone] Initial call taken by: Rudene Anda,  June 06, 2010 1:40 PM    Prescriptions: DILTIAZEM HCL 120 MG TABS (DILTIAZEM HCL) once daily  #30 x 2   Entered by:   Everitt Amber LPN   Authorized by:   Syliva Overman MD   Signed by:   Everitt Amber LPN on 95/62/1308   Method used:   Electronically to        [facility] (retail)       [address] [place], [place]  [address]       Ph: [phone]       Fax: [phone]   RxID:   [id number]

## 2010-11-07 NOTE — Progress Notes (Signed)
Summary: referral  Phone Note Call from Patient   Summary of Call: pt has appt with dr. Harlon Flor on 01/04/2010 2:30. pt notified  Initial call taken by: Rudene Anda,  December 01, 2009 2:18 PM

## 2010-11-07 NOTE — Letter (Signed)
Summary: medical release  medical release   Imported By: Lind Guest 02/22/2010 16:35:59  _____________________________________________________________________  External Attachment:    Type:   Image     Comment:   External Document

## 2010-11-07 NOTE — Assessment & Plan Note (Signed)
Summary: office visit   Vital Signs:  Patient profile:   77 year old female Menstrual status:  hysterectomy Height:      69 inches Weight:      176 pounds BMI:     26.08 O2 Sat:      97 % Pulse rate:   91 / minute Pulse rhythm:   regular Resp:     16 per minute BP sitting:   120 / 78  (left arm) Cuff size:   large  Vitals Entered By: Everitt Amber LPN (November 23, 2009 9:15 AM)  Nutrition Counseling: Patient's BMI is greater than 25 and therefore counseled on weight management options. CC: Follow up chronic problems Is Patient Diabetic? No Pain Assessment Patient in pain? no        Primary Care Provider:  Lodema Hong, M.D.  CC:  Follow up chronic problems.  History of Present Illness: Reports  that she is doing  doing well. Denies recent fever or chills. Denies sinus pressure, nasal congestion , ear pain or sore throat. Denies chest congestion, or cough productive of sputum. Denies chest pain, palpitations, PND, orthopnea or leg swelling. Denies abdominal pain, nausea, vomitting, diarrhea or constipation. Denies change in bowel movements or bloody stool. Denies dysuria , frequency, incontinence or hesitancy.  Denies headaches, vertigo, seizures. Denies depression, anxiety or insomnia. Denies  rash, lesions, or itch.Recent rash is improved and cleared up.     Current Medications (verified): 1)  Alprazolam 0.25 Mg Tabs (Alprazolam) .... Once Daily As Needed 2)  Diltiazem Hcl 120 Mg Tabs (Diltiazem Hcl) .... Once Daily 3)  Aspirin 81 Mg Tbec (Aspirin) .... Once Daily 4)  Vitamin D 50,000 Units .... Q Week 5)  Levetiracetam 500 Mg Tabs (Levetiracetam) .... Two Times A Day 6)  Omeprazole 20 Mg Tbec (Omeprazole) .... One By Mouth Daily 7)  Fluoxetine Hcl 10 Mg Caps (Fluoxetine Hcl) .... Take 1 Capsule By Mouth Once A Day 8)  Lovastatin 40 Mg Tabs (Lovastatin) .... Take Two Tabs By Mouth Qhs 9)  Oscal 500/200 D-3 500-200 Mg-Unit Tabs (Calcium-Vitamin D) .... One Tab By  Mouth Tid 10)  Multivitamins  Tabs (Multiple Vitamin) .... One Tab By Mouth Qd 11)  Clotrimazole-Betamethasone 1-0.05 % Crea (Clotrimazole-Betamethasone) .... Apply To Rash in Left Armpit Twice Daily For 2 Weeks , Then As Needed 12)  Terbinafine Hcl 250 Mg Tabs (Terbinafine Hcl) .... Take 1 Tablet By Mouth Once A Day 13)  Fluconazole 150 Mg Tabs (Fluconazole) .... Take 1 Tablet By Mouth Once A Day  Allergies (verified): No Known Drug Allergies  Review of Systems      See HPI Eyes:  Complains of vision loss-both eyes; pt needs appt for eye exam. Neuro:  Complains of poor balance; ambulates with a cane reportedly because of seizure history. Endo:  Denies cold intolerance, excessive hunger, excessive thirst, excessive urination, heat intolerance, polyuria, and weight change. Heme:  Denies abnormal bruising and bleeding. Allergy:  Denies hives or rash, seasonal allergies, and sneezing.  Physical Exam  General:  Well-developed,well-nourished,in no acute distress; alert,appropriate and cooperative throughout examination HEENT: No facial asymmetry,  EOMI, No sinus tenderness, TM's Clear, oropharynx  pink and moist.   Chest: Clear to auscultation bilaterally.  CVS: S1, S2, No murmurs, No S3.   Abd: Soft, Nontender.  MS: decreased ROM spine, hips, shoulders and knees. Ambulates with cane Ext: No edema.   CNS: CN 2-12 intact,decreased  powe and  tonein lower ext  Skin: Intact,no rash visible Psych: Good  eye contact, normal affect.  Memory intact, not anxious or depressed appearing.    Impression & Recommendations:  Problem # 1:  EXAMINATION OF EYES AND VISION (ICD-V72.0) Assessment Comment Only  Orders: Ophthalmology Referral (Ophthalmology)  Problem # 2:  DERMATITIS (ICD-692.9) Assessment: Improved  Problem # 3:  CONSTIPATION (ICD-564.00) Assessment: Improved uses miralax on avg every 3 days  Problem # 4:  ESSENTIAL HYPERTENSION (ICD-401.9) Assessment: Unchanged  Her updated  medication list for this problem includes:    Diltiazem Hcl 120 Mg Tabs (Diltiazem hcl) ..... Once daily  BP today: 120/78 Prior BP: 120/80 (10/21/2009)  Labs Reviewed: K+: 4.5 (06/09/2009) Creat: : 0.67 (06/09/2009)   Chol: 205 (06/09/2009)   HDL: 57 (06/09/2009)   LDL: 135 (06/09/2009)   TG: 67 (06/09/2009)  Problem # 5:  HYPERLIPIDEMIA (ICD-272.4) Assessment: Comment Only  Her updated medication list for this problem includes:    Lovastatin 40 Mg Tabs (Lovastatin) .Marland Kitchen... Take two tabs by mouth qhs  Labs Reviewed: SGOT: 21 (06/09/2009)   SGPT: 15 (06/09/2009)   HDL:57 (06/09/2009)  LDL:135 (06/09/2009)  Chol:205 (06/09/2009)  Trig:67 (06/09/2009)  Complete Medication List: 1)  Alprazolam 0.25 Mg Tabs (Alprazolam) .... Once daily as needed 2)  Diltiazem Hcl 120 Mg Tabs (Diltiazem hcl) .... Once daily 3)  Aspirin 81 Mg Tbec (Aspirin) .... Once daily 4)  Vitamin D 50,000 Units  .... Q week 5)  Levetiracetam 500 Mg Tabs (Levetiracetam) .... Two times a day 6)  Omeprazole 20 Mg Tbec (Omeprazole) .... One by mouth daily 7)  Fluoxetine Hcl 10 Mg Caps (Fluoxetine hcl) .... Take 1 capsule by mouth once a day 8)  Lovastatin 40 Mg Tabs (Lovastatin) .... Take two tabs by mouth qhs 9)  Oscal 500/200 D-3 500-200 Mg-unit Tabs (Calcium-vitamin d) .... One tab by mouth tid 10)  Multivitamins Tabs (Multiple vitamin) .... One tab by mouth qd 11)  Clotrimazole-betamethasone 1-0.05 % Crea (Clotrimazole-betamethasone) .... Apply to rash in left armpit twice daily for 2 weeks , then as needed 12)  Terbinafine Hcl 250 Mg Tabs (Terbinafine hcl) .... Take 1 tablet by mouth once a day 13)  Fluconazole 150 Mg Tabs (Fluconazole) .... Take 1 tablet by mouth once a day  Patient Instructions: 1)  Please schedule a follow-up appointment in 4 months. 2)  You are doing very well. 3)  No med changes at this time. 4)  I will refer you for an eye exam Prescriptions: LOVASTATIN 40 MG TABS (LOVASTATIN) Take two  tabs by mouth qhs  #60 x 4   Entered by:   Everitt Amber LPN   Authorized by:   Syliva Overman MD   Signed by:   Everitt Amber LPN on 16/07/9603   Method used:   Electronically to        [facility] (retail)       [address]       [place], [place]  [address]       Ph: [phone]       Fax: [phone]   RxID:   [id number]

## 2010-11-07 NOTE — Assessment & Plan Note (Signed)
Summary: office visit   Vital Signs:  Patient profile:   77 year old female Menstrual status:  hysterectomy Height:      69 inches Weight:      179.50 pounds BMI:     26.60 O2 Sat:      96 % Pulse rate:   74 / minute Resp:     16 per minute BP sitting:   144 / 90 Cuff size:   regular  Vitals Entered By: Everitt Amber LPN (June 22, 2010 1:53 PM) CC: has a sore knot under her left breast    Primary Provider:  Lodema Hong, M.D.   CC:  has a sore knot under her left breast .  History of Present Illness: Pt presents today for f/u of infected sebacous cyst.  She has completed antibiotic prescription.  She is uncertain if the area is getting better or not. No drainage.  Is cleaning the area every day with peroxide.  No fever or chills.  She has no other complaints or concerns today. Declines flu vac.   Current Medications (verified): 1)  Alprazolam 0.25 Mg Tabs (Alprazolam) .... Once Daily As Needed 2)  Diltiazem Hcl 120 Mg Tabs (Diltiazem Hcl) .... Once Daily 3)  Aspirin 81 Mg Tbec (Aspirin) .... Once Daily 4)  Vitamin D 50,000 Units .... Q Week 5)  Levetiracetam 500 Mg Tabs (Levetiracetam) .... Two Times A Day 6)  Omeprazole 20 Mg Tbec (Omeprazole) .... One By Mouth Daily 7)  Fluoxetine Hcl 10 Mg Caps (Fluoxetine Hcl) .... Take 1 Capsule By Mouth Once A Day 8)  Lovastatin 40 Mg Tabs (Lovastatin) .... Take Two Tabs By Mouth Qhs 9)  Multivitamins  Tabs (Multiple Vitamin) .... One Tab By Mouth Qd 10)  Oscal 500/200 D-3 500-200 Mg-Unit Tabs (Calcium-Vitamin D) .... Take 1 Tablet By Mouth Three Times A Day  Allergies (verified): No Known Drug Allergies  Review of Systems General:  Denies chills and fever.  Physical Exam  General:  Well-developed,well-nourished,in no acute distress; alert,appropriate and cooperative throughout examination Head:  Normocephalic and atraumatic without obvious abnormalities. No apparent alopecia or balding. Skin:  Lt inferior breast approx 6 mm  area of erythema with central opening.  White matter noted, and comedomal pore was easily expressed.  No purulent mucus or drainage.   Impression & Recommendations:  Problem # 1:  SEBACEOUS CYST, INFECTED (ICD-706.2) Assessment Improved  Complete Medication List: 1)  Alprazolam 0.25 Mg Tabs (Alprazolam) .... Once daily as needed 2)  Diltiazem Hcl 120 Mg Tabs (Diltiazem hcl) .... Once daily 3)  Aspirin 81 Mg Tbec (Aspirin) .... Once daily 4)  Vitamin D 50,000 Units  .... Q week 5)  Levetiracetam 500 Mg Tabs (Levetiracetam) .... Two times a day 6)  Omeprazole 20 Mg Tbec (Omeprazole) .... One by mouth daily 7)  Fluoxetine Hcl 10 Mg Caps (Fluoxetine hcl) .... Take 1 capsule by mouth once a day 8)  Lovastatin 40 Mg Tabs (Lovastatin) .... Take two tabs by mouth qhs 9)  Multivitamins Tabs (Multiple vitamin) .... One tab by mouth qd 10)  Oscal 500/200 D-3 500-200 Mg-unit Tabs (Calcium-vitamin d) .... Take 1 tablet by mouth three times a day  Patient Instructions: 1)  Keep your next appt with DrSimpson. 2)  Wash the area of the infected cyst on your Left breast once a day. 3)  Apply antibiotic ointment and band aid until healed. Prescriptions: OMEPRAZOLE 20 MG TBEC (OMEPRAZOLE) one by mouth daily  #30 x 3   Entered by:  Everitt Amber LPN   Authorized by:   Syliva Overman MD   Signed by:   Everitt Amber LPN on 40/98/1191   Method used:   Printed then faxed to ...       [facility] (retail)       [address]       [place], [place]  [address]       Ph: [phone]       Fax: [phone]   RxID:   [id number] OSCAL 500/200 D-3 500-200 MG-UNIT TABS (CALCIUM-VITAMIN D) Take 1 tablet by mouth three times a day  #90 x 3   Entered by:   Everitt Amber LPN   Authorized by:   Syliva Overman MD   Signed by:   Everitt Amber LPN on 44/12/4740   Method used:   Printed then faxed to ...       [facility] (retail)       [address]        [place], [place]  [address]       Ph: [phone]       Fax: [phone]   RxID:   [id number] DILTIAZEM HCL 120 MG TABS (DILTIAZEM HCL) once daily  #30 x 3   Entered by:   Everitt Amber LPN   Authorized by:   Syliva Overman MD   Signed by:   Everitt Amber LPN on 02/54/2706   Method used:   Printed then faxed to ...       [facility] (retail)       [address]       [place], [place]  [address]       Ph: [phone]       Fax: [phone]   RxID:   [id number] ALPRAZOLAM 0.25 MG TABS (ALPRAZOLAM) once daily as needed  #30 x 3   Entered by:   Everitt Amber LPN   Authorized by:   Syliva Overman MD   Signed by:   Everitt Amber LPN on 29/93/7169   Method used:   Printed then faxed to ...       [facility] (retail)       [address]       [place], [place]  [address]       Ph: [phone]       Fax: [phone]   RxID:   [id number]

## 2010-11-07 NOTE — Assessment & Plan Note (Signed)
Summary: OV   Vital Signs:  Patient profile:   77 year old female Menstrual status:  hysterectomy Height:      69 inches Weight:      179.75 pounds BMI:     26.64 O2 Sat:      97 % Pulse rate:   66 / minute Pulse rhythm:   regular Resp:     16 per minute BP sitting:   138 / 70  (left arm) Cuff size:   regular  Vitals Entered By: Everitt Amber LPN (June 14, 2010 10:48 AM)  Nutrition Counseling: Patient's BMI is greater than 25 and therefore counseled on weight management options. CC: Follow up chronic problems   Primary Care Provider:  Lodema Hong, M.D.   CC:  Follow up chronic problems.  History of Present Illness: Reports  that they are doing well. she was doing fairly well until several evenings ago when at her dumpster she was stung by a bewe. The daughter states she was unable to detect significant swelling or redness in the area, her mother continues to repeatedly go to the dumpster, she has an obssesion with emptying trash. She has alos noted a bump under her left breast which has been present for several days.  Denies recent fever or chills. Denies sinus pressure, nasal congestion , ear pain or sore throat. Denies chest congestion, or cough productive of sputum. Denies chest pain, palpitations, PND, orthopnea or leg swelling. Denies abdominal pain, nausea, vomitting, diarrhea or constipation. Denies change in bowel movements or bloody stool. Denies dysuria , frequency, incontinence or hesitancy. Denies  joint pain, swelling, or reduced mobility. Denies headaches, vertigo, seizures. Denies depression, anxiety or insomnia.    Current Medications (verified): 1)  Alprazolam 0.25 Mg Tabs (Alprazolam) .... Once Daily As Needed 2)  Diltiazem Hcl 120 Mg Tabs (Diltiazem Hcl) .... Once Daily 3)  Aspirin 81 Mg Tbec (Aspirin) .... Once Daily 4)  Vitamin D 50,000 Units .... Q Week 5)  Levetiracetam 500 Mg Tabs (Levetiracetam) .... Two Times A Day 6)  Omeprazole 20 Mg Tbec  (Omeprazole) .... One By Mouth Daily 7)  Fluoxetine Hcl 10 Mg Caps (Fluoxetine Hcl) .... Take 1 Capsule By Mouth Once A Day 8)  Lovastatin 40 Mg Tabs (Lovastatin) .... Take Two Tabs By Mouth Qhs 9)  Multivitamins  Tabs (Multiple Vitamin) .... One Tab By Mouth Qd 10)  Oscal 500/200 D-3 500-200 Mg-Unit Tabs (Calcium-Vitamin D) .... Take 1 Tablet By Mouth Three Times A Day  Allergies (verified): No Known Drug Allergies  Review of Systems      See HPI Eyes:  Denies discharge and red eye. MS:  Complains of joint pain and stiffness. Endo:  Denies excessive thirst and excessive urination. Heme:  Denies abnormal bruising and bleeding. Allergy:  Denies hives or rash and itching eyes.  Physical Exam  General:  Well-developed,well-nourished,in no acute distress; alert,appropriate and cooperative throughout examination HEENT: No facial asymmetry,  EOMI, No sinus tenderness, TM's Clear, oropharynx  pink and moist.   Chest: Clear to auscultation bilaterally.  CVS: S1, S2, No murmurs, No S3.   Abd: Soft, Nontender.  MS: decreased  ROM spine, hips, shoulders and knees.  Ext: No edema.   CNS: CN 2-12 intact, power tone and sensation normal throughout.   Skin:infected cyst which is draining under left breast Psych: Good eye contact, normal affect.  Memory intact, not anxious or depressed appearing.    Impression & Recommendations:  Problem # 1:  SEBACEOUS CYST, INFECTED (ICD-706.2)  Assessment Comment Only keflex prescribed  Problem # 2:  GERD (ICD-530.81) Assessment: Unchanged  Her updated medication list for this problem includes:    Omeprazole 20 Mg Tbec (Omeprazole) ..... One by mouth daily  Problem # 3:  SEIZURE DISORDER (ICD-780.39) Assessment: Improved  Her updated medication list for this problem includes:    Levetiracetam 500 Mg Tabs (Levetiracetam) .Marland Kitchen..Marland Kitchen Two times a day followed by neurology, no recent seoizures  Problem # 4:  DEPRESSION (ICD-311) Assessment:  Improved  Her updated medication list for this problem includes:    Alprazolam 0.25 Mg Tabs (Alprazolam) ..... Once daily as needed    Fluoxetine Hcl 10 Mg Caps (Fluoxetine hcl) .Marland Kitchen... Take 1 capsule by mouth once a day  Problem # 5:  ESSENTIAL HYPERTENSION (ICD-401.9) Assessment: Deteriorated  Her updated medication list for this problem includes:    Diltiazem Hcl 120 Mg Tabs (Diltiazem hcl) ..... Once daily  BP today: 138/70 Prior BP: 120/74 (03/29/2010)  Labs Reviewed: K+: 3.8 (11/28/2009) Creat: : 0.85 (11/28/2009)   Chol: 143 (11/28/2009)   HDL: 58 (11/28/2009)   LDL: 77 (11/28/2009)   TG: 38 (11/28/2009)  Complete Medication List: 1)  Alprazolam 0.25 Mg Tabs (Alprazolam) .... Once daily as needed 2)  Diltiazem Hcl 120 Mg Tabs (Diltiazem hcl) .... Once daily 3)  Aspirin 81 Mg Tbec (Aspirin) .... Once daily 4)  Vitamin D 50,000 Units  .... Q week 5)  Levetiracetam 500 Mg Tabs (Levetiracetam) .... Two times a day 6)  Omeprazole 20 Mg Tbec (Omeprazole) .... One by mouth daily 7)  Fluoxetine Hcl 10 Mg Caps (Fluoxetine hcl) .... Take 1 capsule by mouth once a day 8)  Lovastatin 40 Mg Tabs (Lovastatin) .... Take two tabs by mouth qhs 9)  Multivitamins Tabs (Multiple vitamin) .... One tab by mouth qd 10)  Oscal 500/200 D-3 500-200 Mg-unit Tabs (Calcium-vitamin d) .... Take 1 tablet by mouth three times a day 11)  Keflex 500 Mg Caps (Cephalexin) .... Take 1 capsule by mouth two times a day  Patient Instructions: 1)  Please  keep appt as before. 2)  You are being treaed for an infected cyst on your left breast. 3)  Pls keep clean salt water or peroxide 4)  cover with dry gauze and take all antibiotics prescribed. 5)  Pls call if not better next week. 6)  PLs do not go the dumpster   Prescriptions: OSCAL 500/200 D-3 500-200 MG-UNIT TABS (CALCIUM-VITAMIN D) Take 1 tablet by mouth three times a day  #90 x 3   Entered by:   Everitt Amber LPN   Authorized by:   Syliva Overman MD    Signed by:   Everitt Amber LPN on 16/07/9603   Method used:   Printed then faxed to ...       [facility]* (retail)       [address]       [place], [place]  [address]       Ph: [phone]       Fax: [phone]   RxID:   [id number] ALPRAZOLAM 0.25 MG TABS (ALPRAZOLAM) once daily as needed  #30 x 3   Entered by:   Everitt Amber LPN   Authorized by:   Syliva Overman MD   Signed by:   Everitt Amber LPN on 08/04/2535   Method used:   Printed then faxed to ...       [facility]* (retail)  [address]       [place], [place]  [address]       Ph: [phone]       Fax: [phone]   RxID:   [id number] OMEPRAZOLE 20 MG TBEC (OMEPRAZOLE) one by mouth daily  #30 x 4   Entered by:   Everitt Amber LPN   Authorized by:   Syliva Overman MD   Signed by:   Everitt Amber LPN on 16/10/930   Method used:   Printed then faxed to ...       [facility] (retail)       [address]       [place], [place]  [address]       Ph: [phone]       Fax: [phone]   RxID:   [id number] DILTIAZEM HCL 120 MG TABS (DILTIAZEM HCL) once daily  #30 x 3   Entered by:   Everitt Amber LPN   Authorized by:   Syliva Overman MD   Signed by:   Everitt Amber LPN on 54/62/7035   Method used:   Printed then faxed to ...       [facility] (retail)       [address]       [place], [place]  [address]       Ph: [phone]       Fax: [phone]   RxID:   [id number] KEFLEX 500 MG CAPS (CEPHALEXIN) Take 1 capsule by mouth two times a day  #14 x 0   Entered and Authorized by:   Syliva Overman MD   Signed by:   Syliva Overman MD on 06/14/2010   Method used:   Electronically to        [facility] (retail)       [address]       [place], [place]  [address]       Ph: [phone]       Fax: [phone]   RxID:    [id number]

## 2010-11-07 NOTE — Progress Notes (Signed)
Summary: refill  Phone Note Call from Patient   Summary of Call: needs a refill on blood pressure meds. [phone] Initial call taken by: Rudene Anda,  July 05, 2010 9:22 AM    Prescriptions: DILTIAZEM HCL 120 MG TABS (DILTIAZEM HCL) once daily  #30 x 0   Entered by:   Adella Hare LPN   Authorized by:   Syliva Overman MD   Signed by:   Adella Hare LPN on 45/40/9811   Method used:   Electronically to        [facility] (retail)       [address] [place], [place]  [address]       Ph: [phone]       Fax: [phone]   RxID:   [id number]

## 2010-11-07 NOTE — Progress Notes (Signed)
Summary: MEDICINES  Phone Note Call from Patient   Summary of Call: HER MEDICINES WERE NOT AT [facility]. Initial call taken by: Lind Guest,  August 03, 2010 12:04 PM  Follow-up for Phone Call        scripts were recieved per pharmacist, called patient, no answer Follow-up by: Adella Hare LPN,  August 03, 2010 3:52 PM

## 2010-11-07 NOTE — Progress Notes (Signed)
Summary: BP  Phone Note Call from Patient   Summary of Call: WANTS YOU TO KNOW HER BP IS 165/73 CK AT [facility]. ABOUT 15 MINS. AGO CALL BACK AT [phone] Initial call taken by: Lind Guest,  August 28, 2010 2:49 PM  Follow-up for Phone Call        nurse visit for bP check in am, if she develops headache or light headed , go to the ed pls give appt for nurse visit in am bP check as work in [employer]  Follow-up by: Syliva Overman MD,  August 28, 2010 3:28 PM  Additional Follow-up for Phone Call Additional follow up Details #1::        comin in the morning at 10:00 Additional Follow-up by: Lind Guest,  August 28, 2010 4:40 PM

## 2010-11-09 NOTE — Progress Notes (Signed)
Summary: bp medication  Phone Note Call from Patient   Summary of Call: patient called in and wanted to make sure she was suppose to start her new bp medication tonight.  I advised the patient to start taking the medication tonight per Asher Muir. Initial call taken by: Curtis Sites,  October 25, 2010 2:14 PM  Follow-up for Phone Call        this is accurate Follow-up by: Adella Hare LPN,  October 25, 2010 2:30 PM

## 2010-11-15 ENCOUNTER — Encounter (INDEPENDENT_AMBULATORY_CARE_PROVIDER_SITE_OTHER): Payer: 59 | Admitting: Psychiatry

## 2010-11-15 DIAGNOSIS — F329 Major depressive disorder, single episode, unspecified: Secondary | ICD-10-CM

## 2010-11-15 DIAGNOSIS — F411 Generalized anxiety disorder: Secondary | ICD-10-CM

## 2010-11-15 DIAGNOSIS — F3289 Other specified depressive episodes: Secondary | ICD-10-CM

## 2010-11-15 NOTE — Assessment & Plan Note (Signed)
Summary: f up   Vital Signs:  Patient profile:   77 year old female Menstrual status:  hysterectomy Height:      69 inches Weight:      176.75 pounds BMI:     26.20 O2 Sat:      98 % Pulse rate:   61 / minute Pulse rhythm:   regular Resp:     16 per minute BP sitting:   156 / 84  (left arm) Cuff size:   regular  Vitals Entered By: Everitt Amber LPN (October 25, 2010 10:35 AM)  Nutrition Counseling: Patient's BMI is greater than 25 and therefore counseled on weight management options. CC: Follow up, has been having pain in her left knee area, wants labs to check for diabetes   Primary Care Provider:  Lodema Hong, M.D.   CC:  Follow up, has been having pain in her left knee area, and wants labs to check for diabetes.  History of Present Illness: Reports  that tshe has been doing fairly well. Denies recent fever or chills. Denies sinus pressure, nasal congestion , ear pain or sore throat. Denies chest congestion, or cough productive of sputum. Denies chest pain, palpitations, PND, orthopnea or leg swelling. Denies abdominal pain, nausea, vomitting, diarrhea or constipation. Denies change in bowel movements or bloody stool. Denies dysuria , frequency, incontinence or hesitancy.  Denies headaches, vertigo, seizures.  Denies  rash, lesions, or itch.     Current Medications (verified): 1)  Diltiazem Hcl 120 Mg Tabs (Diltiazem Hcl) .... Once Daily 2)  Aspirin 81 Mg Tbec (Aspirin) .... Once Daily 3)  Vitamin D 50,000 Units .... Q Week 4)  Levetiracetam 500 Mg Tabs (Levetiracetam) .... Two Times A Day 5)  Omeprazole 20 Mg Tbec (Omeprazole) .... One By Mouth Daily 6)  Fluoxetine Hcl 10 Mg Caps (Fluoxetine Hcl) .... Take 1 Capsule By Mouth Once A Day 7)  Lovastatin 40 Mg Tabs (Lovastatin) .... Take Two Tabs By Mouth At Bedtime. 8)  Multivitamins  Tabs (Multiple Vitamin) .... One Tab By Mouth Once Daily. 9)  Oyster Shell Calcium/d 500-125 Mg-Unit Tabs (Calcium-Vitamin D) .Marland Kitchen.. 1 Tab  By Mouth Three Times A Day  Allergies (verified): No Known Drug Allergies  Review of Systems      See HPI General:  Complains of fatigue and sleep disorder. Eyes:  Complains of vision loss-both eyes. MS:  Complains of joint pain and stiffness; 2 week h/o left knee pain and stiffness,no recent trauma. Psych:  Complains of anxiety, depression, and mental problems; denies suicidal thoughts/plans, thoughts of violence, and unusual visions or sounds; well controlled on meds. Endo:  Denies cold intolerance, excessive hunger, excessive thirst, excessive urination, and heat intolerance. Heme:  Denies abnormal bruising and bleeding. Allergy:  Denies hives or rash and itching eyes.  Physical Exam  General:  Well-developed,well-nourished,in no acute distress; alert,appropriate and cooperative throughout examination HEENT: No facial asymmetry,  EOMI, No sinus tenderness, TM's Clear, oropharynx  pink and moist.   Chest: Clear to auscultation bilaterally.  CVS: S1, S2, No murmurs, No S3.   Abd: Soft, Nontender.  MS: Adequate ROM spine, hips, shoulders and reduced in left knee.  Ext: No edema.   CNS: CN 2-12 intact, power tone and sensation normal throughout.   Skin: Intact, no visible lesions or rashes.  Psych: Good eye contact, flat affect.  Memory loss, both anxious and  depressed appearing.    Impression & Recommendations:  Problem # 1:  OBSESSIVE-COMPULSIVE DISORDER (ICD-300.3) Assessment Improved  Problem #  2:  INSOMNIA (ICD-780.52) Assessment: Comment Only  The following medications were removed from the medication list:    Temazepam 15 Mg Caps (Temazepam) .Marland Kitchen... Take 1 capsule by mouth at bedtime for sleep  Discussed sleep hygiene.   Problem # 3:  DEPRESSION (ICD-311) Assessment: Improved  Her updated medication list for this problem includes:    Fluoxetine Hcl 10 Mg Caps (Fluoxetine hcl) .Marland Kitchen... Take 1 capsule by mouth once a day  Problem # 4:  ESSENTIAL HYPERTENSION  (ICD-401.9) Assessment: Deteriorated  Her updated medication list for this problem includes:    Diltiazem Hcl 120 Mg Tabs (Diltiazem hcl) ..... Once daily    Clonidine Hcl 0.1 Mg Tabs (Clonidine hcl) .Marland Kitchen... Take 1 tab by mouth at bedtime  Orders: Medicare Electronic Prescription [phone]) T-Basic Metabolic Panel [phone])  BP today: 156/84 Prior BP: 124/80 (08/29/2010)  Labs Reviewed: K+: 3.8 (11/28/2009) Creat: : 0.85 (11/28/2009)   Chol: 143 (11/28/2009)   HDL: 58 (11/28/2009)   LDL: 77 (11/28/2009)   TG: 38 (11/28/2009)  Problem # 5:  KNEE PAIN, LEFT (ICD-719.46) Assessment: Deteriorated  Her updated medication list for this problem includes:    Aspirin 81 Mg Tbec (Aspirin) ..... Once daily  Discussed strengthening exercises, use of ice or heat, and medications.   Problem # 6:  HYPERLIPIDEMIA (ICD-272.4) Assessment: Improved  Her updated medication list for this problem includes:    Lovastatin 40 Mg Tabs (Lovastatin) .Marland Kitchen... Take two tabs by mouth at bedtime.  Orders: T-Hepatic Function [phone] [id number]) T-Lipid Profile [phone])  Labs Reviewed: SGOT: 21 (11/28/2009)   SGPT: 16 (11/28/2009)   HDL:58 (11/28/2009), 57 (06/09/2009)  LDL:77 (11/28/2009), 135 (47/42/5956)  Chol:143 (11/28/2009), 205 (06/09/2009)  Trig:38 (11/28/2009), 67 (06/09/2009)  Complete Medication List: 1)  Diltiazem Hcl 120 Mg Tabs (Diltiazem hcl) .... Once daily 2)  Aspirin 81 Mg Tbec (Aspirin) .... Once daily 3)  Vitamin D 50,000 Units  .... Q week 4)  Levetiracetam 500 Mg Tabs (Levetiracetam) .... Two times a day 5)  Omeprazole 20 Mg Tbec (Omeprazole) .... One by mouth daily 6)  Fluoxetine Hcl 10 Mg Caps (Fluoxetine hcl) .... Take 1 capsule by mouth once a day 7)  Lovastatin 40 Mg Tabs (Lovastatin) .... Take two tabs by mouth at bedtime. 8)  Multivitamins Tabs (Multiple vitamin) .... One tab by mouth once daily. 9)  Oyster Shell Calcium/d 500-125 Mg-unit Tabs (Calcium-vitamin d) .Marland Kitchen.. 1 tab by  mouth three times a day 10)  Clonidine Hcl 0.1 Mg Tabs (Clonidine hcl) .... Take 1 tab by mouth at bedtime  Other Orders: T-CBC w/Diff ([id number]) T-TSH ([id number])  Patient Instructions: 1)  Please schedule a follow-up appointment in  4 to 6 weeks. 2)  your BP is high, you need to start a new med for this, take at bedtime. 3)  BMP prior to visit, ICD-9: 4)  Hepatic Panel prior to visit, ICD-9: 5)  Lipid Panel prior to visit, ICD-9:  fasting asap 6)  TSH prior to visit, ICD-9: 7)  CBC w/ Diff prior to visit, ICD-9: Prescriptions: OYSTER SHELL CALCIUM/D 500-125 MG-UNIT TABS (CALCIUM-VITAMIN D) 1 tab by mouth three times a day  #90 x 3   Entered by:   Adella Hare LPN   Authorized by:   Syliva Overman MD   Signed by:   Adella Hare LPN on 30/16/0109   Method used:   Electronically to        [facility]* (retail)       [address]  [place], [place]  [address]       Ph: [phone]       Fax: [phone]   RxID:   [id number] MULTIVITAMINS  TABS (MULTIPLE VITAMIN) one tab by mouth once daily.  #30 Each x 3   Entered by:   Adella Hare LPN   Authorized by:   Syliva Overman MD   Signed by:   Adella Hare LPN on 27/25/3664   Method used:   Electronically to        [facility] (retail)       [address]       [place], [place]  [address]       Ph: [phone]       Fax: [phone]   RxID:   [id number] LOVASTATIN 40 MG TABS (LOVASTATIN) Take two tabs by mouth at bedtime.  #60 Each x 3   Entered by:   Adella Hare LPN   Authorized by:   Syliva Overman MD   Signed by:   Adella Hare LPN on 35/57/3220   Method used:   Electronically to        [facility] (retail)       [address] [place], [place]  [address]       Ph: [phone]       Fax: [phone]   RxID:   [id number] OMEPRAZOLE 20 MG TBEC (OMEPRAZOLE) one by mouth daily  #30  x 3   Entered by:   Adella Hare LPN   Authorized by:   Syliva Overman MD   Signed by:   Adella Hare LPN on 00/93/8182   Method used:   Electronically to        [facility] (retail)       [address] [place], [place]  [address]       Ph: [phone]       Fax: [phone]   RxID:   [id number] DILTIAZEM HCL 120 MG TABS (DILTIAZEM HCL) once daily  #30 x 3   Entered by:   Adella Hare LPN   Authorized by:   Syliva Overman MD   Signed by:   Adella Hare LPN on 40/05/6760   Method used:   Electronically to        [facility] (retail)       [address] [place], [place]  [address]       Ph: [phone]       Fax: [phone]   RxID:   [id number] CLONIDINE HCL 0.1 MG TABS (CLONIDINE HCL) Take 1 tab by mouth at bedtime  #30 x 2   Entered and Authorized by:   Syliva Overman MD   Signed by:   Syliva Overman MD on 10/25/2010   Method used:   Electronically to        [facility] (retail)       [address]       [place], [place]  [address]       Ph: [phone]       Fax: [phone]   RxID:   [id number]    Orders Added:  1)  Est. Patient Level IV [[address]] 2)  Medicare Electronic Prescription [G8553] 3)  T-Basic Metabolic Panel [80048-22910] 4)  T-Hepatic Function [80076-22960] 5)  T-Lipid Profile [80061-22930] 6)  T-CBC w/Diff [60454-09811] 7)  T-TSH [[id number]]

## 2010-11-21 ENCOUNTER — Ambulatory Visit (INDEPENDENT_AMBULATORY_CARE_PROVIDER_SITE_OTHER): Payer: BC Managed Care – PPO | Admitting: Psychiatry

## 2010-11-21 DIAGNOSIS — F329 Major depressive disorder, single episode, unspecified: Secondary | ICD-10-CM

## 2010-11-21 DIAGNOSIS — F3289 Other specified depressive episodes: Secondary | ICD-10-CM

## 2010-11-22 ENCOUNTER — Ambulatory Visit: Payer: Self-pay | Admitting: Family Medicine

## 2010-11-29 ENCOUNTER — Encounter: Payer: Self-pay | Admitting: Family Medicine

## 2010-11-29 ENCOUNTER — Ambulatory Visit (INDEPENDENT_AMBULATORY_CARE_PROVIDER_SITE_OTHER): Payer: Medicare Other | Admitting: Family Medicine

## 2010-11-29 DIAGNOSIS — I10 Essential (primary) hypertension: Secondary | ICD-10-CM

## 2010-11-29 DIAGNOSIS — F3289 Other specified depressive episodes: Secondary | ICD-10-CM

## 2010-11-29 DIAGNOSIS — G4700 Insomnia, unspecified: Secondary | ICD-10-CM

## 2010-11-29 DIAGNOSIS — F329 Major depressive disorder, single episode, unspecified: Secondary | ICD-10-CM

## 2010-11-29 DIAGNOSIS — R569 Unspecified convulsions: Secondary | ICD-10-CM

## 2010-12-01 ENCOUNTER — Encounter: Payer: Self-pay | Admitting: Family Medicine

## 2010-12-01 LAB — CONVERTED CEMR LAB: Vit D, 25-Hydroxy: 62 ng/mL (ref 30–89)

## 2010-12-05 NOTE — Assessment & Plan Note (Signed)
Summary: F UP   Vital Signs:  Patient profile:   77 year old female Menstrual status:  hysterectomy Height:      69 inches Weight:      178.25 pounds BMI:     26.42 O2 Sat:      97 % Pulse rate:   61 / minute Pulse rhythm:   regular Resp:     16 per minute BP sitting:   130 / 74  (right arm) Cuff size:   regular  Vitals Entered By: Everitt Amber LPN (November 29, 2010 10:10 AM)  Nutrition Counseling: Patient's BMI is greater than 25 and therefore counseled on weight management options. CC: Follow up chronic problems, and to review labs   Primary Care Provider:  Lodema Hong, M.D.   CC:  Follow up chronic problems and and to review labs.  History of Present Illness: Reports  that she is doing fairly well, she has no new concerns. She has been to behavioral health and finds this useful Denies recent fever or chills. Denies sinus pressure, nasal congestion , ear pain or sore throat. Denies chest congestion, or cough productive of sputum. Denies chest pain, palpitations, PND, orthopnea or leg swelling. Denies abdominal pain, nausea, vomitting, diarrhea or constipation. Denies change in bowel movements or bloody stool. Denies dysuria , frequency, incontinence or hesitancy.  Denies headaches, vertigo, seizures.  Denies  rash, lesions, or itch.     Current Medications (verified): 1)  Diltiazem Hcl 120 Mg Tabs (Diltiazem Hcl) .... Once Daily 2)  Aspirin 81 Mg Tbec (Aspirin) .... Once Daily 3)  Vitamin D 50,000 Units .... Q Week 4)  Levetiracetam 500 Mg Tabs (Levetiracetam) .... Two Times A Day 5)  Omeprazole 20 Mg Tbec (Omeprazole) .... One By Mouth Daily 6)  Fluoxetine Hcl 10 Mg Caps (Fluoxetine Hcl) .... Take 1 Capsule By Mouth Once A Day 7)  Lovastatin 40 Mg Tabs (Lovastatin) .... Take Two Tabs By Mouth At Bedtime. 8)  Multivitamins  Tabs (Multiple Vitamin) .... One Tab By Mouth Once Daily. 9)  Oyster Shell Calcium/d 500-125 Mg-Unit Tabs (Calcium-Vitamin D) .Marland Kitchen.. 1 Tab By  Mouth Three Times A Day 10)  Clonidine Hcl 0.1 Mg Tabs (Clonidine Hcl) .... Take 1 Tab By Mouth At Bedtime  Allergies (verified): No Known Drug Allergies  Review of Systems      See HPI General:  Complains of fatigue. Eyes:  Denies discharge and eye pain. MS:  Complains of joint pain, low back pain, mid back pain, and stiffness. Neuro:  Complains of memory loss. Psych:  Complains of anxiety, depression, and mental problems; denies suicidal thoughts/plans, thoughts of violence, and unusual visions or sounds. Endo:  Denies cold intolerance, excessive hunger, excessive thirst, and heat intolerance. Heme:  Denies abnormal bruising and bleeding. Allergy:  Denies hives or rash and itching eyes.  Physical Exam  General:  Well-developed,well-nourished,in no acute distress; alert,appropriate and cooperative throughout examination HEENT: No facial asymmetry,  EOMI, No sinus tenderness, TM's Clear, oropharynx  pink and moist.   Chest: Clear to auscultation bilaterally.  CVS: S1, S2, No murmurs, No S3.   Abd: Soft, Nontender.  JS:[id number]  ROM spine, hips, shoulders and reduced in left knee.  Ext: No edema.   CNS: CN 2-12 intact, power tone and sensation normal throughout.   Skin: Intact, no visible lesions or rashes.  Psych: Good eye contact, normal  affect.  Memory loss, not anxious or  depressed appearing.    Impression & Recommendations:  Problem # 1:  OBSESSIVE-COMPULSIVE DISORDER (ICD-300.3) Assessment Improved pt encouraged to continue with therapist  Problem # 2:  SEIZURE DISORDER (ICD-780.39) Assessment: Unchanged  Her updated medication list for this problem includes:    Levetiracetam 500 Mg Tabs (Levetiracetam) .Marland Kitchen..Marland Kitchen Two times a day followed by neurology, no recent seizure activity  Problem # 3:  HYPERLIPIDEMIA (ICD-272.4) Assessment: Unchanged  Her updated medication list for this problem includes:    Lovastatin 40 Mg Tabs (Lovastatin) .Marland Kitchen... Take two tabs by mouth  at bedtime.  Labs Reviewed: SGOT: 24 (11/03/2010)   SGPT: 16 (11/03/2010)   HDL:65 (11/03/2010), 58 (11/28/2009)  LDL:73 (11/03/2010), 77 (11/28/2009)  Chol:146 (11/03/2010), 143 (11/28/2009)  Trig:38 (11/03/2010), 38 (11/28/2009)  Problem # 4:  ESSENTIAL HYPERTENSION (ICD-401.9) Assessment: Improved  Her updated medication list for this problem includes:    Diltiazem Hcl 120 Mg Tabs (Diltiazem hcl) ..... Once daily    Clonidine Hcl 0.1 Mg Tabs (Clonidine hcl) .Marland Kitchen... Take 1 tab by mouth at bedtime  Orders: Medicare Electronic Prescription ([phone])  BP today: 130/74 Prior BP: 156/84 (10/25/2010)  Labs Reviewed: K+: 3.9 (11/03/2010) Creat: : 0.78 (11/03/2010)   Chol: 146 (11/03/2010)   HDL: 65 (11/03/2010)   LDL: 73 (11/03/2010)   TG: 38 (11/03/2010)  Complete Medication List: 1)  Diltiazem Hcl 120 Mg Tabs (Diltiazem hcl) .... Once daily 2)  Aspirin 81 Mg Tbec (Aspirin) .... Once daily 3)  Levetiracetam 500 Mg Tabs (Levetiracetam) .... Two times a day 4)  Omeprazole 20 Mg Tbec (Omeprazole) .... One by mouth daily 5)  Fluoxetine Hcl 10 Mg Caps (Fluoxetine hcl) .... Take 1 capsule by mouth once a day 6)  Lovastatin 40 Mg Tabs (Lovastatin) .... Take two tabs by mouth at bedtime. 7)  Multivitamins Tabs (Multiple vitamin) .... One tab by mouth once daily. 8)  Oyster Shell Calcium/d 500-125 Mg-unit Tabs (Calcium-vitamin d) .Marland Kitchen.. 1 tab by mouth three times a day 9)  Clonidine Hcl 0.1 Mg Tabs (Clonidine hcl) .... Take 1 tab by mouth at bedtime  Other Orders: T-Vitamin D (25-Hydroxy) ([id number])  Patient Instructions: 1)  Please schedule a follow-up appointment in 4.5 months. 2)  Your labs are excellent , and so is your blood pressure, no med changes 3)  Vit D,  today Prescriptions: CLONIDINE HCL 0.1 MG TABS (CLONIDINE HCL) Take 1 tab by mouth at bedtime  #30 x 3   Entered by:   Adella Hare LPN   Authorized by:   Syliva Overman MD   Signed by:   Adella Hare LPN on 95/62/1308    Method used:   Electronically to        [facility] (retail)       [address] [place], [place]  [address]       Ph: [phone]       Fax: [phone]   RxID:   [id number]    Orders Added: 1)  Est. Patient Level IV [38756] 2)  Medicare Electronic Prescription [G8553] 3)  T-Vitamin D (25-Hydroxy) [phone]

## 2010-12-05 NOTE — Letter (Signed)
Summary: D/C MEDICINE  D/C MEDICINE   Imported By: Lind Guest 12/01/2010 15:55:45  _____________________________________________________________________  External Attachment:    Type:   Image     Comment:   External Document

## 2010-12-08 ENCOUNTER — Encounter (INDEPENDENT_AMBULATORY_CARE_PROVIDER_SITE_OTHER): Payer: Medicare Other | Admitting: Psychiatry

## 2010-12-08 DIAGNOSIS — F411 Generalized anxiety disorder: Secondary | ICD-10-CM

## 2010-12-08 DIAGNOSIS — F329 Major depressive disorder, single episode, unspecified: Secondary | ICD-10-CM

## 2010-12-08 DIAGNOSIS — F3289 Other specified depressive episodes: Secondary | ICD-10-CM

## 2010-12-29 NOTE — Progress Notes (Signed)
Alejandra Cruz, KNOUFF                ACCOUNT NO.:  [id number]  MEDICAL RECORD NO.:  [id number]           PATIENT TYPE:  A  LOCATION:  [facility]                           FACILITY:  [facility]  PHYSICIAN:  Mareo Portilla T. Avo Schlachter, M.D.   DATE OF BIRTH:  06/25/1933                                PROGRESS NOTE   HISTORY OF PRESENT ILLNESS: The patient is 77 year old African American retired female who was referred from primary care doctor, Dr. Lodema Hong for evaluation and treatment.  The patient has also seeing a therapist for past 3 months for chronic anxiety and depression.  The patient endorsed increased anxiety a few months ago with increased crying spells, social isolation and having hallucinations and paranoia.  She was started on Prozac by Dr. Lodema Hong which gradually had helped her a lot.  She is also seeing a therapist on a regular basis to deal with her chronic depression and feels therapy is working.  The patient is reluctant to change her medications or add any medication.  She is even thinking if some of the medication can be cut down.  She admitted her chronic issues are that one of her sons who lives in [place] does not talk to her.  She did not provide much detail about it, but reported sometimes she feels that he calls and talks to her.  The patient has reported no side effects of current medication.  She has been sleeping good.  Recently, there are no crying spells, anxiety or paranoia.  However, she feels sometimes unsafe living by herself, and at times when the wind blows, she feels somebody is knocking at the door.  She has enrolled herself in elderly program where every Wednesday police call to check her about her status.  She is thinking to inform the police to have more surveillance in her neighbor. She denies any recent paranoia, hallucinations, crying spells or anger.  PAST PSYCHIATRIC HISTORY: No inpatient psychiatric history or previous history of suicidal attempt or severe anger.   She just started taking Prozac in October by Dr. Lodema Hong 10 mg a day.  No history of psychosis or paranoid behavior in the past which requires inpatient treatment.  FAMILY HISTORY: The patient denies any history of family psychiatric illness.  PSYCHOSOCIAL HISTORY: The patient was born in [place] [facility] [place] and raised in [place].  She has four children from different relationship. She has been married once.  However, her marriage lasted only for 3 months.  She has nine grand children and seven great-grandchildren. Most of her family lives in [place].  She has one son who lives in [place], [place] of her other children lives in [place] in [place]. The patient denies any history of physical, sexual mental or verbal abuse by anyone.  MEDICAL HISTORY: 1. History of hypertension. 2. GERD. 3. Seizure disorder.  She sees Dr. Lodema Hong.  CURRENT MEDICATIONS: 1. Vitamin D 5000 units daily. 2. Clonidine 0.1 mg at bedtime. 3. Lovastatin 40 mg 2 at bedtime. 4. Diltiazem 120 mg daily. 5. Oyster shell daily. 6. Omeprazole 20 mg daily. 7. Keppra 500 mg twice a day. 8.  Aspirin daily. 9. Prozac 10 mg daily. 10.Multivitamin daily.  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  MENTAL STATUS EXAM: The patient is elderly woman but very pleasant, cooperative and maintaining good eye contact.  Her speech is soft and slow, but clear and coherent.  She denies any auditory hallucinations, suicidal thinking or homicidal thinking.  Her thought processes are logical, linear and goal-directed.  There is some poverty of thought content, but there were no paranoia or delusions noted at this time.  She is alert and oriented x3.  Her insight, judgment, impulse control were okay.  DIAGNOSIS: AXIS I:  Depressive disorder NOS. AXIS II:  Deferred. AXIS III:  See medical  history. AXIS IV:  Mild. AXIS V:  65.  PLAN: At this time, the patient is taking the Prozac 10 mg daily and seeking therapy from the counselor in this office and had improved in past few weeks.  The patient does not want to change any medication or add any medication and would like to continue the current regime and therapy.  I have explained the risks and benefits of medication in detail.  She will continue to follow up with Rema Fendt, our therapist for counseling, and seeking medication refills from Dr. Lodema Hong.  I have recommended her call us if she needs any help which she acknowledged.  We will not schedule any appointment for medication management at this time     [facility] T. Lolly Mustache, M.D.     STA/MEDQ  D:  11/21/2010  T:  11/21/2010  Job:  [id number]  Electronically Signed by Kathryne Sharper M.D. on 11/22/2010 06:07:51 PM

## 2011-01-15 LAB — COMPREHENSIVE METABOLIC PANEL
ALT: 15 U/L (ref 0–35)
Albumin: 3.7 g/dL (ref 3.5–5.2)
Alkaline Phosphatase: 74 U/L (ref 39–117)
Calcium: 8.3 mg/dL — ABNORMAL LOW (ref 8.4–10.5)
Creatinine, Ser: 0.73 mg/dL (ref 0.4–1.2)
GFR calc non Af Amer: >60 mL/min (ref >60)
Potassium: 3.2 mEq/L — ABNORMAL LOW (ref 3.5–5.1)
Sodium: 140 mEq/L (ref 135–145)
Total Protein: 7 g/dL (ref 6.0–8.3)

## 2011-01-15 LAB — CBC
HCT: 35 % — ABNORMAL LOW (ref 36.0–46.0)
MCV: 90 fL (ref 78.0–100.0)
RDW: 14.1 % (ref 11.5–15.5)
WBC: 4.9 10*3/uL (ref 4.0–10.5)

## 2011-01-15 LAB — DIFFERENTIAL
Basophils Absolute: 0 10*3/uL (ref 0.0–0.1)
Eosinophils Relative: 1 % (ref 0–5)
Neutrophils Relative %: 54 % (ref 43–77)

## 2011-01-17 ENCOUNTER — Encounter (INDEPENDENT_AMBULATORY_CARE_PROVIDER_SITE_OTHER): Payer: BC Managed Care – PPO | Admitting: Psychiatry

## 2011-01-17 DIAGNOSIS — F329 Major depressive disorder, single episode, unspecified: Secondary | ICD-10-CM

## 2011-01-17 DIAGNOSIS — F411 Generalized anxiety disorder: Secondary | ICD-10-CM

## 2011-01-17 DIAGNOSIS — F3289 Other specified depressive episodes: Secondary | ICD-10-CM

## 2011-01-19 ENCOUNTER — Encounter (HOSPITAL_COMMUNITY): Payer: BC Managed Care – PPO | Admitting: Psychiatry

## 2011-01-26 ENCOUNTER — Other Ambulatory Visit: Payer: Self-pay | Admitting: Family Medicine

## 2011-01-31 ENCOUNTER — Ambulatory Visit (INDEPENDENT_AMBULATORY_CARE_PROVIDER_SITE_OTHER): Payer: BC Managed Care – PPO | Admitting: Gastroenterology

## 2011-01-31 ENCOUNTER — Encounter: Payer: Self-pay | Admitting: Gastroenterology

## 2011-01-31 DIAGNOSIS — R1310 Dysphagia, unspecified: Secondary | ICD-10-CM

## 2011-01-31 DIAGNOSIS — Z860100 Personal history of colon polyps, unspecified: Secondary | ICD-10-CM

## 2011-01-31 DIAGNOSIS — K5900 Constipation, unspecified: Secondary | ICD-10-CM

## 2011-01-31 DIAGNOSIS — K219 Gastro-esophageal reflux disease without esophagitis: Secondary | ICD-10-CM

## 2011-01-31 NOTE — Assessment & Plan Note (Signed)
RESOLVED. EGD PRN.

## 2011-01-31 NOTE — Progress Notes (Signed)
  Subjective:    Patient ID: Alejandra Cruz, female    DOB: 1933-01-12, 77 y.o.   MRN: [id number] Pcp: SIMPSON, M.D.  HPI  No rectal bleeding. BMs: 3x/week, st 3x/day. Takes Miralax 3x/week. No diarrhea, problems swallowing, or heartburn/indigestion. Appetite pretty good. Doesn't like to cook as much as she used to.  Past Medical History  Diagnosis Date  . Hyperlipidemia   . Hypertension   . Seizures january 2010    Pt was running into the house to turn off the stove , fell and hit her head,. Since then she has developed seizure disorder   . Seizures     Dr. Gerilyn Pilgrim   . Restless legs syndrome   . GERD (gastroesophageal reflux disease)   . Gastritis, Helicobacter pylori 2010    Rx. ABO for 10 days   . Esophageal web 2010    DILATION 16 MM  . Diverticulosis OCT 2010 TCS    SIGMOID COLON     Review of Systems Apr 2011: 179 lbs     Objective:   Physical Exam  Vitals reviewed. Constitutional: She appears well-developed and well-nourished.  HENT:  Head: Normocephalic and atraumatic.  Cardiovascular: Normal rate and regular rhythm.   Pulmonary/Chest: Effort normal and breath sounds normal.  Abdominal: Soft. Bowel sounds are normal.  Neurological: She is alert.  Skin: Skin is warm and dry.  Psychiatric: Thought content normal.          Assessment & Plan:

## 2011-01-31 NOTE — Assessment & Plan Note (Signed)
CONTROLLED. CONTINUE OMP.

## 2011-01-31 NOTE — Progress Notes (Signed)
Cc to PCP 

## 2011-01-31 NOTE — Assessment & Plan Note (Signed)
MULTIPLE SIMPLE ADENOMAS removed. Discussed with pt and her daughter. All first degree relatives needs TCS at age 75. SCREENING TCS in 10-15 years if the benefits outweigh the risks.

## 2011-01-31 NOTE — Progress Notes (Signed)
Reminder in epic for pt to have a TCS in 10-15 yrs

## 2011-01-31 NOTE — Assessment & Plan Note (Signed)
RESOLVED. Continue MLX. OPV PRN.

## 2011-02-05 ENCOUNTER — Telehealth: Payer: Self-pay | Admitting: Family Medicine

## 2011-02-05 MED ORDER — PROMETHAZINE HCL 12.5 MG PO TABS: 12.5000 mg | ORAL_TABLET | Freq: Three times a day (TID) | ORAL | Status: AC | PRN

## 2011-02-05 MED ORDER — DIPHENOXYLATE-ATROPINE 2.5-0.025 MG PO TABS: 1.0000 | ORAL_TABLET | Freq: Four times a day (QID) | ORAL | Status: DC | PRN

## 2011-02-05 NOTE — Telephone Encounter (Signed)
meds sent, patient aware 

## 2011-02-05 NOTE — Telephone Encounter (Signed)
Advise and erx lomotil 1 four times daily for diareah as needed and phenergan 12.5 mg one every 8 hours as needed for nausea and vomiting #20 no refills on either, she may use otc lomotil instead of the imodium whichever she prefers. Advise the BRAT diet and small amt of fluid often to prevent dehydration pls  OK to offer zofran 4mg  IM in the office as a nurse visit also if able

## 2011-02-16 ENCOUNTER — Encounter (INDEPENDENT_AMBULATORY_CARE_PROVIDER_SITE_OTHER): Payer: Medicare Other | Admitting: Psychiatry

## 2011-02-16 DIAGNOSIS — F3289 Other specified depressive episodes: Secondary | ICD-10-CM

## 2011-02-16 DIAGNOSIS — F329 Major depressive disorder, single episode, unspecified: Secondary | ICD-10-CM

## 2011-02-16 DIAGNOSIS — F411 Generalized anxiety disorder: Secondary | ICD-10-CM

## 2011-02-20 NOTE — Op Note (Signed)
Alejandra Cruz, Alejandra Cruz                ACCOUNT NO.:  [id number]   MEDICAL RECORD NO.:  [id number]          PATIENT TYPE:  AMB   LOCATION:  DAY                           FACILITY:  [facility]   PHYSICIAN:  Kassie Mends, M.D.      DATE OF BIRTH:  03/15/1933   DATE OF PROCEDURE:  05/13/2009  DATE OF DISCHARGE:  05/13/2009                                PROCEDURE NOTE   REFERRING Carolyn Sylvia:  Tesfaye D. Felecia Shelling, MD   PROCEDURES:  Esophagogastroduodenoscopy with Savary dilation and cold  forceps biopsy of the gastric mucosa.   INDICATION FOR EXAM:  Ms. Renner is a 77 year old female who presents  with solid and pill dysphagia.  She also had weight loss.   FINDINGS:  1. Possible proximal cervical web.  No stricture, mass, erosion, or      ulceration seen in the distal esophagus.  Retroflexed view of the      GE junction shows a widely patent GE junction.  2. Patchy erythema from mid body of the stomach to the pylorus.      Biopsies obtained via cold forceps to evaluate for H. pylori      gastritis.  3. Normal duodenal bulb, ampulla and second portion of the duodenum.   DIAGNOSES:  1. 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  2. Mild gastritis.   RECOMMENDATIONS:  1. Will call Ms. Fuston with the results of her biopsies.  2. She already has a followup appointment to see me in 2 months.  We      will reassess the swallowing at that time.  If she continues to      have swallowing difficulty, then we will obtain a barium swallow      for evidence to evaluate for esophageal motility disorder or Zenker      diverticulum.  3. She may resume her previous diet.  She is given information on      gastritis.  4. No aspirin, NSAIDs or anticoagulation for 7 days.   MEDICATIONS:  1. Demerol 50 mg IV.  2. Versed 3 mg IV.   PROCEDURE TECHNIQUE:  Physical exam was performed.  Informed consent was  obtained from the patient after explaining the benefits, risks and  alternatives to the procedure.  The patient was connected to  the monitor  and placed in left lateral position.  Continuous oxygen was provided by  nasal cannula and IV medicine administered through an indwelling  cannula.  After administration of sedation, the patient's esophagus was  intubated and the scope was advanced under direct visualization to the  second portion of the duodenum.  The scope was removed slowly by  carefully examining the color, texture, anatomy and integrity of the  mucosa on the way out.  Prior to withdrawal of the scope, the Savary  guidewire was introduced.  The esophagus was dilated from 12.8 mm to 16  mm using the Savary dilators over the guidewire.  The guidewires was  passed with mild resistance.  The patient was recovered in endoscopy and  discharged home in satisfactory condition.   PATH:  H. PYLORI GASTRITIS. ABO  BID FOR 10 DAYS.      Kassie Mends, M.D.  Electronically Signed     SM/MEDQ  D:  05/13/2009  T:  05/14/2009  Job:  [id number]   cc:   Tesfaye D. Felecia Shelling, MD  Fax: [phone]

## 2011-02-27 ENCOUNTER — Other Ambulatory Visit: Payer: Self-pay | Admitting: Family Medicine

## 2011-03-08 ENCOUNTER — Other Ambulatory Visit: Payer: Self-pay | Admitting: Family Medicine

## 2011-03-19 ENCOUNTER — Telehealth: Payer: Self-pay | Admitting: Family Medicine

## 2011-03-19 ENCOUNTER — Other Ambulatory Visit: Payer: Self-pay

## 2011-03-19 ENCOUNTER — Other Ambulatory Visit: Payer: Self-pay | Admitting: Family Medicine

## 2011-03-19 MED ORDER — CALCIUM CARB-CHOLECALCIFEROL 500-125 MG-UNIT PO TABS: 1.0000 | ORAL_TABLET | Freq: Three times a day (TID) | ORAL | Status: DC

## 2011-03-19 NOTE — Telephone Encounter (Signed)
Med refilled at [facility]

## 2011-03-30 ENCOUNTER — Other Ambulatory Visit: Payer: Self-pay | Admitting: Family Medicine

## 2011-03-30 NOTE — Telephone Encounter (Signed)
Patient has meds, they had refills on them

## 2011-04-03 ENCOUNTER — Encounter: Payer: Self-pay | Admitting: Family Medicine

## 2011-04-04 ENCOUNTER — Encounter: Payer: Self-pay | Admitting: Family Medicine

## 2011-04-04 ENCOUNTER — Ambulatory Visit (INDEPENDENT_AMBULATORY_CARE_PROVIDER_SITE_OTHER): Payer: BC Managed Care – PPO | Admitting: Family Medicine

## 2011-04-04 VITALS — BP 130/80 | HR 54 | Resp 16 | Ht 67.0 in | Wt 170.1 lb

## 2011-04-04 DIAGNOSIS — F329 Major depressive disorder, single episode, unspecified: Secondary | ICD-10-CM

## 2011-04-04 DIAGNOSIS — R5381 Other malaise: Secondary | ICD-10-CM

## 2011-04-04 DIAGNOSIS — I10 Essential (primary) hypertension: Secondary | ICD-10-CM

## 2011-04-04 DIAGNOSIS — F3289 Other specified depressive episodes: Secondary | ICD-10-CM

## 2011-04-04 DIAGNOSIS — R569 Unspecified convulsions: Secondary | ICD-10-CM

## 2011-04-04 DIAGNOSIS — R5383 Other fatigue: Secondary | ICD-10-CM

## 2011-04-04 DIAGNOSIS — E785 Hyperlipidemia, unspecified: Secondary | ICD-10-CM

## 2011-04-04 MED ORDER — LOVASTATIN 40 MG PO TABS: ORAL_TABLET | ORAL | Status: DC

## 2011-04-04 MED ORDER — CLONIDINE HCL 0.1 MG PO TABS: 0.1000 mg | ORAL_TABLET | Freq: Every day | ORAL | Status: DC

## 2011-04-04 MED ORDER — FLUOXETINE HCL 10 MG PO CAPS: ORAL_CAPSULE | ORAL | Status: DC

## 2011-04-04 NOTE — Patient Instructions (Addendum)
F/u in 4 months  Stop the vitamin  D when you finish the current  Dose  Fasting chem 7, lipid and hepatic today  pls consider tetanus and pneumonia vaccines

## 2011-04-05 LAB — LIPID PANEL
LDL Cholesterol: 76 mg/dL (ref 0–99)
Triglycerides: 59 mg/dL (ref <150)

## 2011-04-05 LAB — BASIC METABOLIC PANEL
BUN: 17 mg/dL (ref 6–23)
Chloride: 101 mEq/L (ref 96–112)
Creat: 0.66 mg/dL (ref 0.50–1.10)
Glucose, Bld: 94 mg/dL (ref 70–99)

## 2011-04-05 LAB — HEPATIC FUNCTION PANEL
Alkaline Phosphatase: 61 U/L (ref 39–117)
Bilirubin, Direct: 0.1 mg/dL (ref 0.0–0.3)
Indirect Bilirubin: 0.4 mg/dL (ref 0.0–0.9)
Total Bilirubin: 0.5 mg/dL (ref 0.3–1.2)
Total Protein: 6.9 g/dL (ref 6.0–8.3)

## 2011-04-08 NOTE — Assessment & Plan Note (Signed)
Improved and controlled on medication, no change 

## 2011-04-08 NOTE — Assessment & Plan Note (Signed)
Controlled, no change in medication  

## 2011-04-08 NOTE — Progress Notes (Signed)
  Subjective:    Patient ID: Alejandra Cruz, female    DOB: Jun 02, 1933, 77 y.o.   MRN: [id number]  HPI The PT is here for follow up and re-evaluation of chronic medical conditions, medication management and review of recent lab and radiology data.  Preventive health is updated, specifically  Cancer screening,  Questions or concerns regarding consultations or procedures which the PT has had in the interim are  addressed. The PT denies any adverse reactions to current medications since the last visit.  There are no new concerns.  There are no specific complaints       Review of Systems Denies recent fever or chills. Denies sinus pressure, nasal congestion, ear pain or sore throat. Denies chest congestion, productive cough or wheezing. Denies chest pains, palpitations, paroxysmal nocturnal dyspnea, orthopnea and leg swelling Denies abdominal pain, nausea, vomiting,diarrhea or constipation.  Denies dysuria, frequency, hesitancy or incontinence. Chronic joint pain,and limitation in mobility. Denies headaches, seizure, numbness, or tingling. Denies depression, anxiety or insomnia. Denies skin break down or rash.        Objective:   Physical Exam Patient alert and oriented and in no Cardiopulmonary distress.  HEENT: No facial asymmetry, EOMI, no sinus tenderness, TM's clear, Oropharynx pink and moist.  Neck supple no adenopathy.  Chest: Clear to auscultation bilaterally.  CVS: S1, S2 no murmurs, no S3.  ABD: Soft non tender. Bowel sounds normal.  Ext: No edema  MS: decreased  ROM spine, shoulders, hips and knees.  Skin: Intact, no ulcerations or rash noted.  Psych: Good eye contact, flat affect. Memory loss, mild, not anxious or depressed appearing.  CNS: CN 2-12 intact, power, tone and sensation normal throughout.        Assessment & Plan:

## 2011-04-08 NOTE — Assessment & Plan Note (Signed)
Controlled, no recent seizure activity reportedly, followed by neurology

## 2011-04-09 ENCOUNTER — Telehealth: Payer: Self-pay | Admitting: Family Medicine

## 2011-04-09 MED ORDER — OYSTER SHELL CALCIUM/D 500-200 MG-UNIT PO TABS: 1.0000 | ORAL_TABLET | Freq: Three times a day (TID) | ORAL | Status: DC

## 2011-04-09 NOTE — Telephone Encounter (Signed)
Med sent as requested 

## 2011-04-10 ENCOUNTER — Telehealth: Payer: Self-pay | Admitting: Family Medicine

## 2011-04-10 NOTE — Telephone Encounter (Signed)
Advise healthy drinks are water and milk , and eat fruit, I am not sure whatr is in energy drinks and do not know why she should need them

## 2011-04-10 NOTE — Telephone Encounter (Signed)
Patient aware.

## 2011-04-12 ENCOUNTER — Telehealth: Payer: Self-pay | Admitting: Family Medicine

## 2011-04-12 MED ORDER — CLONIDINE HCL 0.1 MG PO TABS: 0.1000 mg | ORAL_TABLET | Freq: Every day | ORAL | Status: DC

## 2011-04-12 NOTE — Telephone Encounter (Signed)
Med refilled as requested

## 2011-04-21 ENCOUNTER — Other Ambulatory Visit: Payer: Self-pay | Admitting: Family Medicine

## 2011-04-21 DIAGNOSIS — Z9289 Personal history of other medical treatment: Secondary | ICD-10-CM

## 2011-06-07 ENCOUNTER — Ambulatory Visit (HOSPITAL_COMMUNITY): Payer: Medicare Other

## 2011-06-08 ENCOUNTER — Ambulatory Visit (HOSPITAL_COMMUNITY)
Admission: RE | Admit: 2011-06-08 | Discharge: 2011-06-08 | Disposition: A | Discharge status: Home / Self Care | Payer: Medicare Other | Source: Ambulatory Visit | Attending: Family Medicine | Admitting: Family Medicine

## 2011-06-08 DIAGNOSIS — Z1231 Encounter for screening mammogram for malignant neoplasm of breast: Secondary | ICD-10-CM | POA: Insufficient documentation

## 2011-06-08 DIAGNOSIS — Z9289 Personal history of other medical treatment: Secondary | ICD-10-CM

## 2011-06-28 ENCOUNTER — Other Ambulatory Visit: Payer: Self-pay | Admitting: Family Medicine

## 2011-07-16 ENCOUNTER — Other Ambulatory Visit: Payer: Self-pay | Admitting: Family Medicine

## 2011-08-03 ENCOUNTER — Encounter: Payer: Self-pay | Admitting: Family Medicine

## 2011-08-04 ENCOUNTER — Other Ambulatory Visit: Payer: Self-pay | Admitting: Family Medicine

## 2011-08-08 ENCOUNTER — Ambulatory Visit (INDEPENDENT_AMBULATORY_CARE_PROVIDER_SITE_OTHER): Payer: Medicare Other | Admitting: Family Medicine

## 2011-08-08 ENCOUNTER — Encounter: Payer: Self-pay | Admitting: Family Medicine

## 2011-08-08 DIAGNOSIS — R5381 Other malaise: Secondary | ICD-10-CM

## 2011-08-08 DIAGNOSIS — F3289 Other specified depressive episodes: Secondary | ICD-10-CM

## 2011-08-08 DIAGNOSIS — R569 Unspecified convulsions: Secondary | ICD-10-CM

## 2011-08-08 DIAGNOSIS — E785 Hyperlipidemia, unspecified: Secondary | ICD-10-CM

## 2011-08-08 DIAGNOSIS — I10 Essential (primary) hypertension: Secondary | ICD-10-CM

## 2011-08-08 DIAGNOSIS — M79609 Pain in unspecified limb: Secondary | ICD-10-CM

## 2011-08-08 DIAGNOSIS — F329 Major depressive disorder, single episode, unspecified: Secondary | ICD-10-CM

## 2011-08-08 DIAGNOSIS — K219 Gastro-esophageal reflux disease without esophagitis: Secondary | ICD-10-CM

## 2011-08-08 DIAGNOSIS — Z23 Encounter for immunization: Secondary | ICD-10-CM

## 2011-08-08 DIAGNOSIS — M79601 Pain in right arm: Secondary | ICD-10-CM | POA: Insufficient documentation

## 2011-08-08 MED ORDER — LEVETIRACETAM 500 MG PO TABS: ORAL_TABLET | ORAL | Status: DC

## 2011-08-08 MED ORDER — FLUOXETINE HCL 10 MG PO CAPS: ORAL_CAPSULE | ORAL | Status: DC

## 2011-08-08 MED ORDER — KETOROLAC TROMETHAMINE 60 MG/2ML IM SOLN
60.0000 mg | Freq: Once | INTRAMUSCULAR | Status: AC
  Administered 2011-08-08: 60 mg via INTRAMUSCULAR

## 2011-08-08 MED ORDER — CLONIDINE HCL 0.1 MG PO TABS: 0.1000 mg | ORAL_TABLET | Freq: Every day | ORAL | Status: DC

## 2011-08-08 MED ORDER — OYSTER SHELL CALCIUM/D 500-200 MG-UNIT PO TABS: ORAL_TABLET | ORAL | Status: DC

## 2011-08-08 NOTE — Assessment & Plan Note (Signed)
Controlled, no change in medication  

## 2011-08-08 NOTE — Patient Instructions (Addendum)
F/u mid Feb  Labs to be drawn first week in February fasting  Cbc, chem 7 , lipid, hepatic and tSH  Blood pressure is excellent.  Toradol injection today for right arm pain  tdaP today  You do need a pneumonia vaccine and flu vaccine, pls reconsider  Please be careful not to fall again. De clutter your house and remove rugs

## 2011-08-08 NOTE — Assessment & Plan Note (Signed)
S/p trauma, no visible bruise, toradol admnistered

## 2011-08-08 NOTE — Assessment & Plan Note (Signed)
Stable and followed by neurology 

## 2011-08-08 NOTE — Progress Notes (Signed)
  Subjective:    Patient ID: Alejandra Cruz, female    DOB: 1932-10-13, 77 y.o.   MRN: [id number]  HPI The PT is here for follow up and re-evaluation of chronic medical conditions, medication management and review of any available recent lab and radiology data.  Preventive health is updated, specifically  Cancer screening and Immunization.   Questions or concerns regarding consultations or procedures which the PT has had in the interim are  addressed. The PT denies any adverse reactions to current medications since the last visit.  Tripped on a rug in her home 2 weeks ago, sustained a bruise, still c/o right arm pain. Denies head trauma or laceration. Rug has been removed      Review of Systems See HPI Denies recent fever or chills. Denies sinus pressure, nasal congestion, ear pain or sore throat. Denies chest congestion, productive cough or wheezing. Denies chest pains, palpitations and leg swelling Denies abdominal pain, nausea, vomiting,diarrhea or constipation.   Denies dysuria, frequency, hesitancy or incontinence. Denies headaches, seizures, numbness, or tingling. Denies uncontrolled  Depression or  anxiety does have mild insomnia. Denies skin break down or rash.        Objective:   Physical Exam Patient alert and oriented and in no cardiopulmonary distress.  HEENT: No facial asymmetry, EOMI, no sinus tenderness,  oropharynx pink and moist.  Neck decreased ROM no adenopathy.  Chest: Clear to auscultation bilaterally.  CVS: S1, S2 no murmurs, no S3.  ABD: Soft non tender. Bowel sounds normal.  Ext: No edema  MS: Decreased  ROM spine, shoulders, hips and knees.Tender over right arm  Skin: Intact, no ulcerations or rash noted.  Psych: Good eye contact, flat  affect. Memory impaired  not anxious or depressed appearing.  CNS: CN 2-12 intact, power, tone and sensation normal throughout.        Assessment & Plan:

## 2011-08-14 ENCOUNTER — Telehealth: Payer: Self-pay | Admitting: Family Medicine

## 2011-08-17 NOTE — Telephone Encounter (Signed)
Already filled

## 2011-08-21 ENCOUNTER — Telehealth: Payer: Self-pay | Admitting: Family Medicine

## 2011-08-21 MED ORDER — OYSTER SHELL CALCIUM/D 500-200 MG-UNIT PO TABS: ORAL_TABLET | ORAL | Status: DC

## 2011-08-21 NOTE — Telephone Encounter (Signed)
Was sent on 10/31. Resent again

## 2011-10-25 ENCOUNTER — Other Ambulatory Visit: Payer: Self-pay | Admitting: Family Medicine

## 2011-11-01 ENCOUNTER — Other Ambulatory Visit: Payer: Self-pay | Admitting: Family Medicine

## 2011-11-16 LAB — BASIC METABOLIC PANEL
BUN: 27 mg/dL — ABNORMAL HIGH (ref 6–23)
CO2: 30 mEq/L (ref 19–32)
Chloride: 103 mEq/L (ref 96–112)
Glucose, Bld: 85 mg/dL (ref 70–99)
Potassium: 4.3 mEq/L (ref 3.5–5.3)
Sodium: 142 mEq/L (ref 135–145)

## 2011-11-16 LAB — HEPATIC FUNCTION PANEL
ALT: 16 U/L (ref 0–35)
Bilirubin, Direct: 0.1 mg/dL (ref 0.0–0.3)
Indirect Bilirubin: 0.4 mg/dL (ref 0.0–0.9)
Total Bilirubin: 0.5 mg/dL (ref 0.3–1.2)

## 2011-11-16 LAB — CBC WITH DIFFERENTIAL/PLATELET
Basophils Relative: 1 % (ref 0–1)
HCT: 39.4 % (ref 36.0–46.0)
Hemoglobin: 12.6 g/dL (ref 12.0–15.0)
Lymphocytes Relative: 41 % (ref 12–46)
Lymphs Abs: 1.9 10*3/uL (ref 0.7–4.0)
MCHC: 32 g/dL (ref 30.0–36.0)
Monocytes Absolute: 0.5 10*3/uL (ref 0.1–1.0)
Monocytes Relative: 11 % (ref 3–12)
Neutro Abs: 2.2 10*3/uL (ref 1.7–7.7)
Neutrophils Relative %: 47 % (ref 43–77)
RBC: 4.28 MIL/uL (ref 3.87–5.11)

## 2011-11-16 LAB — LIPID PANEL
Cholesterol: 147 mg/dL (ref 0–200)
LDL Cholesterol: 82 mg/dL (ref 0–99)
Total CHOL/HDL Ratio: 2.5 Ratio
VLDL: 5 mg/dL (ref 0–40)

## 2011-11-16 LAB — TSH: TSH: 1.041 u[IU]/mL (ref 0.350–4.500)

## 2011-11-28 ENCOUNTER — Encounter: Payer: Self-pay | Admitting: Family Medicine

## 2011-11-28 ENCOUNTER — Ambulatory Visit (INDEPENDENT_AMBULATORY_CARE_PROVIDER_SITE_OTHER): Payer: Medicare Other | Admitting: Family Medicine

## 2011-11-28 VITALS — BP 136/74 | HR 61 | Resp 18 | Ht 67.0 in | Wt 172.0 lb

## 2011-11-28 DIAGNOSIS — R0989 Other specified symptoms and signs involving the circulatory and respiratory systems: Secondary | ICD-10-CM

## 2011-11-28 DIAGNOSIS — G4700 Insomnia, unspecified: Secondary | ICD-10-CM

## 2011-11-28 DIAGNOSIS — K219 Gastro-esophageal reflux disease without esophagitis: Secondary | ICD-10-CM

## 2011-11-28 DIAGNOSIS — F329 Major depressive disorder, single episode, unspecified: Secondary | ICD-10-CM

## 2011-11-28 DIAGNOSIS — R569 Unspecified convulsions: Secondary | ICD-10-CM

## 2011-11-28 DIAGNOSIS — E785 Hyperlipidemia, unspecified: Secondary | ICD-10-CM

## 2011-11-28 DIAGNOSIS — F3289 Other specified depressive episodes: Secondary | ICD-10-CM

## 2011-11-28 DIAGNOSIS — I10 Essential (primary) hypertension: Secondary | ICD-10-CM

## 2011-11-28 MED ORDER — DILTIAZEM HCL ER COATED BEADS 120 MG PO CP24: 120.0000 mg | ORAL_CAPSULE | Freq: Every day | ORAL | Status: DC

## 2011-11-28 MED ORDER — LOVASTATIN 40 MG PO TABS: 40.0000 mg | ORAL_TABLET | Freq: Every day | ORAL | Status: DC

## 2011-11-28 MED ORDER — OMEPRAZOLE 20 MG PO CPDR: 20.0000 mg | DELAYED_RELEASE_CAPSULE | Freq: Every day | ORAL | Status: DC

## 2011-11-28 MED ORDER — ONE-DAILY MULTI VITAMINS PO TABS: 1.0000 | ORAL_TABLET | Freq: Every day | ORAL | Status: DC

## 2011-11-28 NOTE — Patient Instructions (Addendum)
F/u in 4.5 month  You are being referred for a carotid doppler, to make sure no blockage in your neck arteries.  I am going to let dr Gerilyn Pilgrim know you have had 2 spells of nearly passing out since December Labs are excellent , no med change.  Pls reconsider the pneumoniaa vaccine

## 2011-12-04 ENCOUNTER — Other Ambulatory Visit: Payer: Self-pay | Admitting: Family Medicine

## 2011-12-04 ENCOUNTER — Ambulatory Visit (HOSPITAL_COMMUNITY)
Admission: RE | Admit: 2011-12-04 | Discharge: 2011-12-04 | Disposition: A | Discharge status: Home / Self Care | Payer: Medicare Other | Source: Ambulatory Visit | Attending: Family Medicine | Admitting: Family Medicine

## 2011-12-04 DIAGNOSIS — R55 Syncope and collapse: Secondary | ICD-10-CM | POA: Insufficient documentation

## 2011-12-04 DIAGNOSIS — R0989 Other specified symptoms and signs involving the circulatory and respiratory systems: Secondary | ICD-10-CM

## 2011-12-04 DIAGNOSIS — I10 Essential (primary) hypertension: Secondary | ICD-10-CM | POA: Insufficient documentation

## 2011-12-08 NOTE — Assessment & Plan Note (Deleted)
Sleep hygiene reviewed, continue med 

## 2011-12-08 NOTE — Assessment & Plan Note (Signed)
Controlled, no change in medication  

## 2011-12-08 NOTE — Progress Notes (Signed)
  Subjective:    Patient ID: Alejandra Cruz, female    DOB: 1933-01-13, 78 y.o.   MRN: [id number]  HPI The PT is here for follow up and re-evaluation of chronic medical conditions, medication management and review of any available recent lab and radiology data.  Preventive health is updated, specifically  Cancer screening and Immunization.   Questions or concerns regarding consultations or procedures which the PT has had in the interim are  addressed. The PT denies any adverse reactions to current medications since the last visit.  Reports 2 recent episodes of near syncope, denies incontinence or jerking accompanying the episodes     Review of Systems See HPI Denies recent fever or chills. Denies sinus pressure, nasal congestion, ear pain or sore throat. Denies chest congestion, productive cough or wheezing. Denies chest pains, palpitations and leg swelling Denies abdominal pain, nausea, vomiting,diarrhea or constipation.   Denies dysuria, frequency, hesitancy or incontinence. Denies joint pain, swelling and limitation in mobility. Denies headaches, seizures, numbness, or tingling. Denies uncontrolled  depression, anxiety or insomnia. Denies skin break down or rash.        Objective:   Physical Exam Patient alert and oriented and in no cardiopulmonary distress.  HEENT: No facial asymmetry, EOMI, no sinus tenderness,  oropharynx pink and moist.  Neck decreased ROM, no adenopathy.bruits  Chest: Clear to auscultation bilaterally.  CVS: S1, S2 no murmurs, no S3.  ABD: Soft non tender. Bowel sounds normal.  Ext: No edema  JY:NWGNFAOZH ROM spine, shoulders, hips and knees.  Skin: Intact, no ulcerations or rash noted.  Psych: Good eye contact, blunted  affect. Memory loss, not anxious or depressed appearing.  CNS: CN 2-12 intact, power, tone and sensation normal throughout.        Assessment & Plan:

## 2011-12-08 NOTE — Assessment & Plan Note (Signed)
Reports recent near syncopal episodes, unsure if these are seizure equivalents, will defer to neurologist who she regularly  sees

## 2011-12-08 NOTE — Assessment & Plan Note (Signed)
Recent near syncope, check doppler

## 2011-12-19 ENCOUNTER — Ambulatory Visit (INDEPENDENT_AMBULATORY_CARE_PROVIDER_SITE_OTHER): Payer: Medicare Other | Admitting: Family Medicine

## 2011-12-19 ENCOUNTER — Encounter: Payer: Self-pay | Admitting: Family Medicine

## 2011-12-19 VITALS — BP 140/84 | HR 68 | Resp 16 | Ht 67.0 in | Wt 168.0 lb

## 2011-12-19 DIAGNOSIS — E785 Hyperlipidemia, unspecified: Secondary | ICD-10-CM

## 2011-12-19 DIAGNOSIS — R569 Unspecified convulsions: Secondary | ICD-10-CM

## 2011-12-19 DIAGNOSIS — F3289 Other specified depressive episodes: Secondary | ICD-10-CM

## 2011-12-19 DIAGNOSIS — F329 Major depressive disorder, single episode, unspecified: Secondary | ICD-10-CM

## 2011-12-19 DIAGNOSIS — B369 Superficial mycosis, unspecified: Secondary | ICD-10-CM

## 2011-12-19 DIAGNOSIS — I10 Essential (primary) hypertension: Secondary | ICD-10-CM

## 2011-12-19 DIAGNOSIS — IMO0002 Reserved for concepts with insufficient information to code with codable children: Secondary | ICD-10-CM | POA: Insufficient documentation

## 2011-12-19 MED ORDER — CEPHALEXIN 500 MG PO CAPS: 500.0000 mg | ORAL_CAPSULE | Freq: Two times a day (BID) | ORAL | Status: AC

## 2011-12-19 MED ORDER — CLOTRIMAZOLE-BETAMETHASONE 1-0.05 % EX CREA: TOPICAL_CREAM | Freq: Two times a day (BID) | CUTANEOUS | Status: DC

## 2011-12-19 NOTE — Patient Instructions (Signed)
F/u as before.  1 week of antibiotic is prescribed for the cyst in your right groin which you noticed 1 week ago. Stop covering with band aid please, and keep clean and dry.  A medicated cream is prescribed for twice daily use to the rash on your neck for 1 week , then as needed

## 2011-12-23 NOTE — Assessment & Plan Note (Signed)
Controlled, no change in medication  

## 2011-12-23 NOTE — Progress Notes (Signed)
  Subjective:    Patient ID: Alejandra Cruz, female    DOB: 12-06-1932, 78 y.o.   MRN: [id number]  HPI Pt in with a primary concern of a tender lesion in right groin which she noticed less than 1 week ago, no drainage form it, no fever or chills. She is also concerned about a pruritic rash on her abdomen which is spreading, has been there for about 2 weeks. Denies recent seizures, no falls appetitie is good Review of Systems See HPI Denies recent fever or chills. Denies sinus pressure, nasal congestion, ear pain or sore throat. Denies chest congestion, productive cough or wheezing. Denies chest pains, palpitations and leg swelling Denies abdominal pain, nausea, vomiting,diarrhea or constipation.   Denies dysuria, frequency, hesitancy or incontinence. Chronic  joint pain,  and limitation in mobility. Denies headaches, seizures, numbness, or tingling. Denies uncontrolled depression, anxiety or insomnia. .        Objective:   Physical Exam Patient alert and oriented and in no cardiopulmonary distress.  HEENT: No facial asymmetry, EOMI, no sinus tenderness,  oropharynx pink and moist.  Neck supple no adenopathy.  Chest: Clear to auscultation bilaterally.  CVS: S1, S2 no murmurs, no S3.  ABD: Soft non tender. Bowel sounds normal.  Ext: No edema  MS: Decreased ROM spine, shoulders, hips and knees.  Skin: hyperpigmented cystic lesion in right inguinal area, also hyperpigmented macular rash in right groin  Psych: Good eye contact,flaty  affect. Memory impaired not anxious or depressed appearing.  CNS: CN 2-12 intact, power, tone and sensation normal throughout.        Assessment & Plan:

## 2011-12-23 NOTE — Assessment & Plan Note (Signed)
Stable , followed by neurology, no recent seizure activity

## 2011-12-23 NOTE — Assessment & Plan Note (Signed)
MildlyTender right inguinal cyst, antibiotic prescribed

## 2011-12-23 NOTE — Assessment & Plan Note (Signed)
Fungal and yeast infection on trunk med prescribed for this

## 2011-12-28 ENCOUNTER — Other Ambulatory Visit: Payer: Self-pay | Admitting: Family Medicine

## 2011-12-31 ENCOUNTER — Telehealth: Payer: Self-pay | Admitting: Family Medicine

## 2011-12-31 NOTE — Telephone Encounter (Signed)
Concern addressed

## 2012-01-23 ENCOUNTER — Other Ambulatory Visit: Payer: Self-pay | Admitting: Family Medicine

## 2012-01-23 ENCOUNTER — Telehealth: Payer: Self-pay | Admitting: Family Medicine

## 2012-01-23 NOTE — Telephone Encounter (Signed)
Sent in today 

## 2012-03-29 ENCOUNTER — Other Ambulatory Visit: Payer: Self-pay | Admitting: Family Medicine

## 2012-04-04 ENCOUNTER — Emergency Department (HOSPITAL_COMMUNITY): Payer: Medicare Other

## 2012-04-04 ENCOUNTER — Inpatient Hospital Stay (HOSPITAL_COMMUNITY)
Admission: EM | Admit: 2012-04-04 | Discharge: 2012-04-08 | DRG: 481 | Disposition: A | Discharge status: Skilled Nursing Facility | Payer: Medicare Other | Attending: Orthopedic Surgery | Admitting: Orthopedic Surgery

## 2012-04-04 ENCOUNTER — Encounter (HOSPITAL_COMMUNITY): Payer: Self-pay | Admitting: *Deleted

## 2012-04-04 DIAGNOSIS — I10 Essential (primary) hypertension: Secondary | ICD-10-CM | POA: Diagnosis present

## 2012-04-04 DIAGNOSIS — S72001A Fracture of unspecified part of neck of right femur, initial encounter for closed fracture: Secondary | ICD-10-CM

## 2012-04-04 DIAGNOSIS — S72109A Unspecified trochanteric fracture of unspecified femur, initial encounter for closed fracture: Principal | ICD-10-CM | POA: Diagnosis present

## 2012-04-04 DIAGNOSIS — Z7982 Long term (current) use of aspirin: Secondary | ICD-10-CM

## 2012-04-04 DIAGNOSIS — W19XXXA Unspecified fall, initial encounter: Secondary | ICD-10-CM | POA: Diagnosis present

## 2012-04-04 DIAGNOSIS — G40909 Epilepsy, unspecified, not intractable, without status epilepticus: Secondary | ICD-10-CM | POA: Diagnosis present

## 2012-04-04 DIAGNOSIS — E785 Hyperlipidemia, unspecified: Secondary | ICD-10-CM | POA: Diagnosis present

## 2012-04-04 DIAGNOSIS — Y92009 Unspecified place in unspecified non-institutional (private) residence as the place of occurrence of the external cause: Secondary | ICD-10-CM

## 2012-04-04 DIAGNOSIS — Z79899 Other long term (current) drug therapy: Secondary | ICD-10-CM

## 2012-04-04 DIAGNOSIS — K219 Gastro-esophageal reflux disease without esophagitis: Secondary | ICD-10-CM | POA: Diagnosis present

## 2012-04-04 DIAGNOSIS — D62 Acute posthemorrhagic anemia: Secondary | ICD-10-CM | POA: Diagnosis not present

## 2012-04-04 DIAGNOSIS — S72009A Fracture of unspecified part of neck of unspecified femur, initial encounter for closed fracture: Secondary | ICD-10-CM

## 2012-04-04 DIAGNOSIS — G2581 Restless legs syndrome: Secondary | ICD-10-CM | POA: Diagnosis present

## 2012-04-04 LAB — URINALYSIS, ROUTINE W REFLEX MICROSCOPIC
Glucose, UA: NEGATIVE mg/dL
Leukocytes, UA: NEGATIVE
Nitrite: NEGATIVE
Specific Gravity, Urine: 1.01 (ref 1.005–1.030)
pH: 8.5 — ABNORMAL HIGH (ref 5.0–8.0)

## 2012-04-04 LAB — COMPREHENSIVE METABOLIC PANEL
AST: 23 U/L (ref 0–37)
Albumin: 3.6 g/dL (ref 3.5–5.2)
Alkaline Phosphatase: 69 U/L (ref 39–117)
Chloride: 104 mEq/L (ref 96–112)
Potassium: 4.1 mEq/L (ref 3.5–5.1)
Total Bilirubin: 0.4 mg/dL (ref 0.3–1.2)

## 2012-04-04 LAB — CBC
MCHC: 33.4 g/dL (ref 30.0–36.0)
Platelets: 188 10*3/uL (ref 150–400)
RDW: 14.2 % (ref 11.5–15.5)

## 2012-04-04 LAB — DIFFERENTIAL
Basophils Absolute: 0 10*3/uL (ref 0.0–0.1)
Basophils Relative: 0 % (ref 0–1)
Neutro Abs: 4.7 10*3/uL (ref 1.7–7.7)
Neutrophils Relative %: 66 % (ref 43–77)

## 2012-04-04 LAB — PROTIME-INR: INR: 1.18 (ref 0.00–1.49)

## 2012-04-04 MED ORDER — CALCIUM CARBONATE-VITAMIN D 500-200 MG-UNIT PO TABS
1.0000 | ORAL_TABLET | Freq: Every day | ORAL | Status: DC
  Administered 2012-04-05 – 2012-04-08 (×4): 1 via ORAL
  Filled 2012-04-04 (×6): qty 1

## 2012-04-04 MED ORDER — HYDROCODONE-ACETAMINOPHEN 5-325 MG PO TABS
1.0000 | ORAL_TABLET | Freq: Four times a day (QID) | ORAL | Status: DC | PRN
  Administered 2012-04-04: 1 via ORAL
  Filled 2012-04-04: qty 1

## 2012-04-04 MED ORDER — POTASSIUM CHLORIDE IN NACL 20-0.9 MEQ/L-% IV SOLN
INTRAVENOUS | Status: DC
  Administered 2012-04-04: 22:00:00 via INTRAVENOUS
  Filled 2012-04-04 (×4): qty 1000

## 2012-04-04 MED ORDER — ENOXAPARIN SODIUM 30 MG/0.3ML ~~LOC~~ SOLN
30.0000 mg | Freq: Two times a day (BID) | SUBCUTANEOUS | Status: DC
  Administered 2012-04-04: 30 mg via SUBCUTANEOUS
  Filled 2012-04-04: qty 0.3

## 2012-04-04 MED ORDER — DOCUSATE SODIUM 100 MG PO CAPS
100.0000 mg | ORAL_CAPSULE | Freq: Two times a day (BID) | ORAL | Status: DC
  Administered 2012-04-04 – 2012-04-05 (×2): 100 mg via ORAL
  Filled 2012-04-04 (×6): qty 1

## 2012-04-04 MED ORDER — MORPHINE SULFATE 2 MG/ML IJ SOLN: 0.5000 mg | INTRAMUSCULAR | Status: DC | PRN

## 2012-04-04 MED ORDER — MORPHINE SULFATE 2 MG/ML IJ SOLN
0.5000 mg | INTRAMUSCULAR | Status: DC | PRN
  Administered 2012-04-04: 0.5 mg via INTRAVENOUS
  Filled 2012-04-04: qty 1

## 2012-04-04 MED ORDER — PANTOPRAZOLE SODIUM 40 MG PO TBEC
40.0000 mg | DELAYED_RELEASE_TABLET | Freq: Every day | ORAL | Status: DC
  Administered 2012-04-04 – 2012-04-08 (×4): 40 mg via ORAL
  Filled 2012-04-04 (×6): qty 1

## 2012-04-04 MED ORDER — SIMVASTATIN 10 MG PO TABS: 5.0000 mg | ORAL_TABLET | Freq: Every day | ORAL | Status: DC

## 2012-04-04 MED ORDER — CLONIDINE HCL 0.1 MG PO TABS
0.1000 mg | ORAL_TABLET | Freq: Every day | ORAL | Status: DC
  Administered 2012-04-06 – 2012-04-08 (×3): 0.1 mg via ORAL
  Filled 2012-04-04 (×6): qty 1

## 2012-04-04 MED ORDER — CEFAZOLIN SODIUM-DEXTROSE 2-3 GM-% IV SOLR
2.0000 g | INTRAVENOUS | Status: AC
  Administered 2012-04-05: 2 g via INTRAVENOUS
  Filled 2012-04-04: qty 50

## 2012-04-04 MED ORDER — FLUOXETINE HCL 10 MG PO CAPS
10.0000 mg | ORAL_CAPSULE | Freq: Every day | ORAL | Status: DC
  Administered 2012-04-05 – 2012-04-08 (×4): 10 mg via ORAL
  Filled 2012-04-04 (×5): qty 1

## 2012-04-04 MED ORDER — SODIUM CHLORIDE 0.9 % IJ SOLN
INTRAMUSCULAR | Status: AC
  Filled 2012-04-04: qty 3

## 2012-04-04 MED ORDER — DILTIAZEM HCL ER COATED BEADS 120 MG PO CP24: 120.0000 mg | ORAL_CAPSULE | ORAL | Status: DC

## 2012-04-04 MED ORDER — METHOCARBAMOL 500 MG PO TABS: 500.0000 mg | ORAL_TABLET | Freq: Four times a day (QID) | ORAL | Status: DC | PRN

## 2012-04-04 MED ORDER — LEVETIRACETAM 500 MG PO TABS
500.0000 mg | ORAL_TABLET | Freq: Two times a day (BID) | ORAL | Status: DC
  Administered 2012-04-04 – 2012-04-08 (×8): 500 mg via ORAL
  Filled 2012-04-04 (×12): qty 1

## 2012-04-04 MED ORDER — SIMVASTATIN 20 MG PO TABS: 40.0000 mg | ORAL_TABLET | Freq: Every day | ORAL | Status: DC

## 2012-04-04 MED ORDER — ATORVASTATIN CALCIUM 10 MG PO TABS
5.0000 mg | ORAL_TABLET | Freq: Every day | ORAL | Status: DC
  Administered 2012-04-04 – 2012-04-07 (×3): 5 mg via ORAL
  Filled 2012-04-04 (×2): qty 0.5
  Filled 2012-04-04 (×3): qty 1

## 2012-04-04 MED ORDER — HYDROCODONE-ACETAMINOPHEN 5-325 MG PO TABS: 1.0000 | ORAL_TABLET | Freq: Four times a day (QID) | ORAL | Status: DC | PRN

## 2012-04-04 MED ORDER — METHOCARBAMOL 100 MG/ML IJ SOLN
500.0000 mg | Freq: Four times a day (QID) | INTRAVENOUS | Status: DC | PRN
  Filled 2012-04-04: qty 5

## 2012-04-04 MED ORDER — PANTOPRAZOLE SODIUM 40 MG PO TBEC: 40.0000 mg | DELAYED_RELEASE_TABLET | Freq: Every day | ORAL | Status: DC

## 2012-04-04 MED ORDER — CHLORHEXIDINE GLUCONATE 4 % EX LIQD
60.0000 mL | Freq: Once | CUTANEOUS | Status: DC
  Filled 2012-04-04: qty 60

## 2012-04-04 MED ORDER — CEFAZOLIN SODIUM-DEXTROSE 2-3 GM-% IV SOLR
2.0000 g | INTRAVENOUS | Status: DC
  Filled 2012-04-04: qty 50

## 2012-04-04 MED ORDER — DILTIAZEM HCL ER COATED BEADS 120 MG PO CP24
120.0000 mg | ORAL_CAPSULE | Freq: Every day | ORAL | Status: DC
  Administered 2012-04-06 – 2012-04-08 (×3): 120 mg via ORAL
  Filled 2012-04-04 (×6): qty 1

## 2012-04-04 NOTE — Progress Notes (Signed)
Subjective: Hip fracture    Objective: BP 136/62  Pulse 62  Temp 98.7 F (37.1 C) (Oral)  Resp 20  SpO2 100%   Vital signs in last 24 hours: Basename 04/04/12 1849  HGB 11.6*    Basename 04/04/12 1849  WBC 7.2  RBC 3.88  HCT 34.7*  PLT 188    Basename 04/04/12 1849  NA 143  K 4.1  CL 104  CO2 27  BUN 19  CREATININE 0.88  GLUCOSE 102*  CALCIUM 9.3    Basename 04/04/12 1849  LABPT --  INR 1.18     Assessment/Plan: Severe comminuted proximal femur/hip fracture (right)  Plan OTIF TOMORROW   Fuller Canada 04/04/2012, 8:47 PM

## 2012-04-04 NOTE — ED Provider Notes (Addendum)
History     CSN: [id number]  Arrival date & time 04/04/12  1645   First MD Initiated Contact with Patient 04/04/12 1655      Chief Complaint  Patient presents with  . Leg Pain  . Fall    (Consider location/radiation/quality/duration/timing/severity/associated sxs/prior treatment) HPI  Patient fell getting out of chair today. She has right lower extremity pain. She denies any other injury. EMS was called because she was unable to get up. EMS reports that they were able to get her up onto the stretcher in 5 minutes. She states that she caught her right leg caught underneath the chair and fell onto the right hip. She denies any other injury. She denies any numbness or tingling.  Past Medical History  Diagnosis Date  . Hyperlipidemia   . Hypertension   . Seizures january 2010    Pt was running into the house to turn off the stove , fell and hit her head,. Since then she has developed seizure disorder   . Seizures     Dr. Gerilyn Pilgrim   . Restless legs syndrome   . GERD (gastroesophageal reflux disease)   . Gastritis, Helicobacter pylori 2010    Rx. ABO for 10 days   . Esophageal web 2010    DILATION 16 MM  . Diverticulosis OCT 2010 TCS    SIGMOID COLON    Past Surgical History  Procedure Date  . Vesicovaginal fistula closure w/ tah 1966    bleeding   . Colonoscopy OCT 2010    TORTUOUS, Sml IH, Tics, MULTIPLE SIMPLE ADENOMAS (<6MM)  . Upper gastrointestinal endoscopy AUG 2010    SAVARY  . Abdominal hysterectomy     Family History  Problem Relation Age of Onset  . Cancer Mother     bladder   . Diabetes Mother   . Diabetes Father   . Emphysema Father   . GER disease Brother   . Colon cancer Neg Hx   . Colon polyps Neg Hx     History  Substance Use Topics  . Smoking status: Never Smoker   . Smokeless tobacco: Never Used  . Alcohol Use: No    OB History    Grav Para Term Preterm Abortions TAB SAB Ect Mult Living                  Review of Systems  All  other systems reviewed and are negative.    Allergies  Review of patient's allergies indicates no known allergies.  Home Medications   Current Outpatient Rx  Name Route Sig Dispense Refill  . ASPIRIN 81 MG PO TABS Oral Take 81 mg by mouth daily.      . OYSTER SHELL CALCIUM/D 500-200 MG-UNIT PO TABS  TAKE (1) TABLET BY MOUTH (3) TIMES DAILY WITH MEALS. 90 each 3  . CARDIZEM CD 120 MG PO CP24  TAKE ONE CAPSULE DAILY FOR BLOOD PRESSURE. 30 each 3  . CLONIDINE HCL 0.1 MG PO TABS  TAKE (1) TABLET BY MOUTH AT BEDTIME. 30 tablet 3  . CLOTRIMAZOLE-BETAMETHASONE 1-0.05 % EX CREA Topical Apply topically 2 (two) times daily. 45 g 1  . LEVETIRACETAM 500 MG PO TABS  Take 500 mg by mouth 2 (two) times daily. 60 tablet 4  . LOVASTATIN 40 MG PO TABS Oral Take 1 tablet (40 mg total) by mouth at bedtime. 60 tablet 3  . ONE-DAILY MULTI VITAMINS PO TABS Oral Take 1 tablet by mouth daily. 30 tablet 3  . PRILOSEC  20 MG PO CPDR  TAKE (1) CAPSULE BY MOUTH DAILY. 30 each 3  . PROZAC 10 MG PO CAPS  TAKE 1 CAPSULE BY MOUTH ONCE A DAY IN THE MORNING AS DIRECTED. 30 each 3    BP 136/62  Pulse 62  Temp 98.7 F (37.1 C) (Oral)  Resp 20  SpO2 100%  Physical Exam  Nursing note and vitals reviewed. Constitutional: She appears well-developed and well-nourished.  HENT:  Head: Normocephalic and atraumatic.  Eyes: Conjunctivae and EOM are normal. Pupils are equal, round, and reactive to light.  Neck: Normal range of motion. Neck supple.  Cardiovascular: Normal rate, regular rhythm, normal heart sounds and intact distal pulses.   Pulmonary/Chest: Effort normal and breath sounds normal.  Abdominal: Soft. Bowel sounds are normal.  Musculoskeletal: Normal range of motion.       Right lower extremity is shortened. There is some tenderness laterally over the hip. There is no tenderness appreciated over the knee or lower extremity. Dorsal pedalis pulses are 2+. She is intact neurologically. There is no sensation or  strength loss in the knee or ankle or toes appear  Neurological: She is alert.  Skin: Skin is warm and dry.  Psychiatric: She has a normal mood and affect. Thought content normal.    ED Course  Procedures (including critical care time)   Labs Reviewed  CBC  DIFFERENTIAL  COMPREHENSIVE METABOLIC PANEL  PROTIME-INR  APTT  TYPE AND SCREEN  URINALYSIS, ROUTINE W REFLEX MICROSCOPIC  URINE CULTURE   Dg Chest 1 View  04/04/2012  *RADIOLOGY REPORT*  Clinical Data: History of fall.  Confusion.  CHEST - 1 VIEW  Comparison: Chest x-K[facility] [place] 10/13/2008.  Findings: Lung volumes are normal.  No consolidative airspace disease.  No pleural effusions.  No pneumothorax.  No pulmonary nodule or mass noted.  Pulmonary vasculature and the cardiomediastinal silhouette are within normal limits. Atherosclerotic calcifications are noted within the arch of the aorta.  Degenerative changes of osteoarthritis are incidentally noted in the glenohumeral joints bilaterally.  IMPRESSION: 1. No radiographic evidence of acute cardiopulmonary disease. 2.  Atherosclerosis.  Original Report Authenticated By: Florencia Reasons, M.D.   Dg Hip Complete Right  04/04/2012  *RADIOLOGY REPORT*  Clinical Data: , fall  RIGHT HIP - COMPLETE 2+ VIEW  Comparison: None  Findings: Osseous demineralization. Symmetric mildly narrowed hip joints bilaterally. Displaced intertrochanteric fracture right femur with varus angulation. A large long oblique fragment and fluid which includes the lesser trochanter is displaced medially. No femoral head dislocation. Pelvis appears intact.  IMPRESSION: Displaced and angulated intertrochanteric fracture right femur as above.  Original Report Authenticated By: Lollie Marrow, M.D.   Dg Femur Right  04/04/2012  *RADIOLOGY REPORT*  Clinical Data: Hip pain, fall  RIGHT FEMUR - 2 VIEW  Comparison: Right hip radiographs 04/04/2012  Findings: Displaced intertrochanteric fracture proximal right femur, reported  separately. Remainder of right femur appears intact. Joint space narrowing right knee. No additional femoral fracture or dislocation is identified distally. Osseous demineralization.  IMPRESSION: Displaced intertrochanteric fracture right femur, please see dedicated right hip radiographs report. No additional right femoral abnormalities identified.  Original Report Authenticated By: Lollie Marrow, M.D.   Dg Tibia/fibula Right  04/04/2012  *RADIOLOGY REPORT*  Clinical Data: History of fall.  RIGHT TIBIA AND FIBULA - 2 VIEW  Comparison: No priors.  Findings: Two views of the right tibia and fibula demonstrate no acute displaced fracture.  Overlying soft tissues are unremarkable.  IMPRESSION: 1.  Negative for acute  displaced fracture.  Original Report Authenticated By: Florencia Reasons, M.D.     No diagnosis found.   Date: 04/04/2012  Rate: 67  Rhythm: normal sinus rhythm  QRS Axis: normal  Intervals: normal  ST/T Wave abnormalities: normal  Conduction Disutrbances: none  Narrative Interpretation: unremarkable      MDM   Right intertroch fracture seen on hip film.   Patient denies pain at this time.  Dr. Romeo Apple paged. Dr. Romeo Apple plans admission   Hilario Quarry, MD 04/04/12 1941  Hilario Quarry, MD 04/04/12 [id number]

## 2012-04-04 NOTE — ED Notes (Signed)
Pt states she fell while getting up from a chair. States pain to right lower leg "below my knee", per pt. NAD.

## 2012-04-04 NOTE — ED Notes (Signed)
Resting quietly, no complaints of pain.  Family at bedside.

## 2012-04-05 ENCOUNTER — Encounter (HOSPITAL_COMMUNITY): Payer: Self-pay | Admitting: Anesthesiology

## 2012-04-05 ENCOUNTER — Encounter (HOSPITAL_COMMUNITY)
Admission: EM | Disposition: A | Discharge status: Skilled Nursing Facility | Payer: Self-pay | Source: Home / Self Care | Attending: Orthopedic Surgery

## 2012-04-05 ENCOUNTER — Inpatient Hospital Stay (HOSPITAL_COMMUNITY): Payer: Medicare Other

## 2012-04-05 ENCOUNTER — Inpatient Hospital Stay (HOSPITAL_COMMUNITY): Payer: Medicare Other | Admitting: Anesthesiology

## 2012-04-05 HISTORY — PX: ORIF HIP FRACTURE: SHX2125

## 2012-04-05 LAB — PREPARE RBC (CROSSMATCH)

## 2012-04-05 LAB — ABO/RH: ABO/RH(D): O POS

## 2012-04-05 LAB — SURGICAL PCR SCREEN: Staphylococcus aureus: NEGATIVE

## 2012-04-05 SURGERY — OPEN REDUCTION INTERNAL FIXATION HIP
Anesthesia: General | Site: Hip | Laterality: Right | Wound class: Clean

## 2012-04-05 MED ORDER — ACETAMINOPHEN 10 MG/ML IV SOLN
INTRAVENOUS | Status: AC
  Administered 2012-04-05: 1000 mg via INTRAVENOUS
  Filled 2012-04-05: qty 100

## 2012-04-05 MED ORDER — ONDANSETRON HCL 4 MG PO TABS: 4.0000 mg | ORAL_TABLET | Freq: Four times a day (QID) | ORAL | Status: DC | PRN

## 2012-04-05 MED ORDER — BUPIVACAINE-EPINEPHRINE PF 0.5-1:200000 % IJ SOLN
INTRAMUSCULAR | Status: DC | PRN
  Administered 2012-04-05: 60 mL

## 2012-04-05 MED ORDER — ACETAMINOPHEN 650 MG RE SUPP: 650.0000 mg | Freq: Four times a day (QID) | RECTAL | Status: DC | PRN

## 2012-04-05 MED ORDER — MENTHOL 3 MG MT LOZG: 1.0000 | LOZENGE | OROMUCOSAL | Status: DC | PRN

## 2012-04-05 MED ORDER — PROPOFOL 10 MG/ML IV EMUL
INTRAVENOUS | Status: DC | PRN
  Administered 2012-04-05: 50 ug/kg/min via INTRAVENOUS

## 2012-04-05 MED ORDER — LIDOCAINE HCL (PF) 1 % IJ SOLN
INTRAMUSCULAR | Status: AC
  Filled 2012-04-05: qty 5

## 2012-04-05 MED ORDER — LIDOCAINE HCL (CARDIAC) 10 MG/ML IV SOLN
INTRAVENOUS | Status: DC | PRN
  Administered 2012-04-05: 50 mg via INTRAVENOUS

## 2012-04-05 MED ORDER — ONDANSETRON HCL 4 MG/2ML IJ SOLN
INTRAMUSCULAR | Status: AC
  Administered 2012-04-05: 4 mg via INTRAVENOUS
  Filled 2012-04-05: qty 2

## 2012-04-05 MED ORDER — METHOCARBAMOL 500 MG PO TABS
500.0000 mg | ORAL_TABLET | Freq: Four times a day (QID) | ORAL | Status: DC
  Administered 2012-04-05 – 2012-04-08 (×10): 500 mg via ORAL
  Filled 2012-04-05 (×10): qty 1

## 2012-04-05 MED ORDER — ONDANSETRON HCL 4 MG/2ML IJ SOLN
4.0000 mg | Freq: Once | INTRAMUSCULAR | Status: AC
  Administered 2012-04-05: 4 mg via INTRAVENOUS

## 2012-04-05 MED ORDER — FENTANYL CITRATE 0.05 MG/ML IJ SOLN
INTRAMUSCULAR | Status: AC
  Filled 2012-04-05: qty 2

## 2012-04-05 MED ORDER — SUCCINYLCHOLINE CHLORIDE 20 MG/ML IJ SOLN
INTRAMUSCULAR | Status: AC
  Filled 2012-04-05: qty 1

## 2012-04-05 MED ORDER — SUCCINYLCHOLINE CHLORIDE 20 MG/ML IJ SOLN
INTRAMUSCULAR | Status: DC | PRN
  Administered 2012-04-05: 100 mg via INTRAVENOUS

## 2012-04-05 MED ORDER — CEFAZOLIN SODIUM-DEXTROSE 2-3 GM-% IV SOLR
INTRAVENOUS | Status: AC
  Filled 2012-04-05: qty 50

## 2012-04-05 MED ORDER — METHOCARBAMOL 100 MG/ML IJ SOLN
500.0000 mg | Freq: Four times a day (QID) | INTRAVENOUS | Status: DC
  Filled 2012-04-05 (×16): qty 5

## 2012-04-05 MED ORDER — ACETAMINOPHEN 10 MG/ML IV SOLN
1000.0000 mg | Freq: Once | INTRAVENOUS | Status: AC
  Administered 2012-04-05: 1000 mg via INTRAVENOUS

## 2012-04-05 MED ORDER — NALOXONE HCL 0.4 MG/ML IJ SOLN
INTRAMUSCULAR | Status: DC | PRN
  Administered 2012-04-05 (×2): .04 ug via INTRAVENOUS

## 2012-04-05 MED ORDER — FENTANYL CITRATE 0.05 MG/ML IJ SOLN
INTRAMUSCULAR | Status: DC | PRN
  Administered 2012-04-05: 25 ug via INTRAVENOUS
  Administered 2012-04-05: 50 ug via INTRAVENOUS
  Administered 2012-04-05 (×2): 25 ug via INTRAVENOUS
  Administered 2012-04-05 (×3): 50 ug via INTRAVENOUS
  Administered 2012-04-05: 25 ug via INTRAVENOUS

## 2012-04-05 MED ORDER — ACETAMINOPHEN 325 MG PO TABS: 650.0000 mg | ORAL_TABLET | Freq: Four times a day (QID) | ORAL | Status: DC | PRN

## 2012-04-05 MED ORDER — DOCUSATE SODIUM 100 MG PO CAPS
100.0000 mg | ORAL_CAPSULE | Freq: Two times a day (BID) | ORAL | Status: DC
  Administered 2012-04-06 – 2012-04-08 (×5): 100 mg via ORAL
  Filled 2012-04-05 (×5): qty 1

## 2012-04-05 MED ORDER — BUPIVACAINE-EPINEPHRINE PF 0.5-1:200000 % IJ SOLN
INTRAMUSCULAR | Status: AC
  Filled 2012-04-05: qty 20

## 2012-04-05 MED ORDER — TRAMADOL HCL 50 MG PO TABS: 50.0000 mg | ORAL_TABLET | Freq: Once | ORAL | Status: DC

## 2012-04-05 MED ORDER — CEFAZOLIN SODIUM-DEXTROSE 2-3 GM-% IV SOLR
2.0000 g | Freq: Four times a day (QID) | INTRAVENOUS | Status: AC
  Administered 2012-04-06: 2 g via INTRAVENOUS
  Filled 2012-04-05 (×2): qty 50

## 2012-04-05 MED ORDER — PROPOFOL 10 MG/ML IV EMUL
INTRAVENOUS | Status: DC | PRN
  Administered 2012-04-05: 100 mg via INTRAVENOUS

## 2012-04-05 MED ORDER — CELECOXIB 100 MG PO CAPS: 400.0000 mg | ORAL_CAPSULE | Freq: Once | ORAL | Status: DC

## 2012-04-05 MED ORDER — METOCLOPRAMIDE HCL 10 MG PO TABS: 5.0000 mg | ORAL_TABLET | Freq: Three times a day (TID) | ORAL | Status: DC | PRN

## 2012-04-05 MED ORDER — ARTIFICIAL TEARS OP OINT
TOPICAL_OINTMENT | OPHTHALMIC | Status: AC
  Filled 2012-04-05: qty 3.5

## 2012-04-05 MED ORDER — TRAMADOL HCL 50 MG PO TABS
50.0000 mg | ORAL_TABLET | Freq: Four times a day (QID) | ORAL | Status: DC
  Administered 2012-04-05 – 2012-04-08 (×8): 50 mg via ORAL
  Filled 2012-04-05 (×9): qty 1

## 2012-04-05 MED ORDER — EPHEDRINE SULFATE 50 MG/ML IJ SOLN
INTRAMUSCULAR | Status: DC | PRN
  Administered 2012-04-05 (×4): 10 mg via INTRAVENOUS

## 2012-04-05 MED ORDER — METOCLOPRAMIDE HCL 5 MG/ML IJ SOLN: 5.0000 mg | Freq: Three times a day (TID) | INTRAMUSCULAR | Status: DC | PRN

## 2012-04-05 MED ORDER — PROPOFOL 10 MG/ML IV EMUL: INTRAVENOUS | Status: DC | PRN

## 2012-04-05 MED ORDER — LACTATED RINGERS IV SOLN
INTRAVENOUS | Status: DC | PRN
  Administered 2012-04-05 (×2): via INTRAVENOUS

## 2012-04-05 MED ORDER — ONDANSETRON HCL 4 MG/2ML IJ SOLN
INTRAMUSCULAR | Status: DC | PRN
  Administered 2012-04-05: 4 mg via INTRAVENOUS

## 2012-04-05 MED ORDER — METHOCARBAMOL 100 MG/ML IJ SOLN
500.0000 mg | Freq: Once | INTRAVENOUS | Status: AC
  Administered 2012-04-05: 500 mg via INTRAVENOUS
  Filled 2012-04-05: qty 5

## 2012-04-05 MED ORDER — SENNOSIDES-DOCUSATE SODIUM 8.6-50 MG PO TABS: 1.0000 | ORAL_TABLET | Freq: Every evening | ORAL | Status: DC | PRN

## 2012-04-05 MED ORDER — PROPOFOL 10 MG/ML IV EMUL
INTRAVENOUS | Status: AC
  Filled 2012-04-05: qty 20

## 2012-04-05 MED ORDER — NALOXONE HCL 0.4 MG/ML IJ SOLN
INTRAMUSCULAR | Status: AC
  Filled 2012-04-05: qty 1

## 2012-04-05 MED ORDER — GLYCOPYRROLATE 0.2 MG/ML IJ SOLN
INTRAMUSCULAR | Status: AC
  Filled 2012-04-05: qty 1

## 2012-04-05 MED ORDER — MIDAZOLAM HCL 5 MG/5ML IJ SOLN
INTRAMUSCULAR | Status: DC | PRN
  Administered 2012-04-05: 2 mg via INTRAVENOUS

## 2012-04-05 MED ORDER — BISACODYL 10 MG RE SUPP: 10.0000 mg | Freq: Every day | RECTAL | Status: DC | PRN

## 2012-04-05 MED ORDER — MIDAZOLAM HCL 2 MG/2ML IJ SOLN
INTRAMUSCULAR | Status: AC
  Filled 2012-04-05: qty 2

## 2012-04-05 MED ORDER — FLUMAZENIL 0.5 MG/5ML IV SOLN
INTRAVENOUS | Status: AC
  Filled 2012-04-05: qty 5

## 2012-04-05 MED ORDER — FLUMAZENIL 0.5 MG/5ML IV SOLN
INTRAVENOUS | Status: DC | PRN
  Administered 2012-04-05: 0.2 mg via INTRAVENOUS

## 2012-04-05 MED ORDER — CEFAZOLIN SODIUM 1-5 GM-% IV SOLN
INTRAVENOUS | Status: DC | PRN
  Administered 2012-04-05: 2 g via INTRAVENOUS

## 2012-04-05 MED ORDER — FLEET ENEMA 7-19 GM/118ML RE ENEM: 1.0000 | ENEMA | Freq: Once | RECTAL | Status: AC | PRN

## 2012-04-05 MED ORDER — CEFAZOLIN SODIUM 1 G IJ SOLR
INTRAMUSCULAR | Status: AC
  Filled 2012-04-05: qty 20

## 2012-04-05 MED ORDER — ENOXAPARIN SODIUM 40 MG/0.4ML ~~LOC~~ SOLN
40.0000 mg | SUBCUTANEOUS | Status: DC
  Filled 2012-04-05: qty 0.4

## 2012-04-05 MED ORDER — ACETAMINOPHEN 10 MG/ML IV SOLN
INTRAVENOUS | Status: AC
  Filled 2012-04-05: qty 200

## 2012-04-05 MED ORDER — CHLORHEXIDINE GLUCONATE 4 % EX LIQD
60.0000 mL | Freq: Once | CUTANEOUS | Status: AC
  Administered 2012-04-05: 4 via TOPICAL
  Filled 2012-04-05: qty 60

## 2012-04-05 MED ORDER — EPHEDRINE SULFATE 50 MG/ML IJ SOLN
INTRAMUSCULAR | Status: AC
  Filled 2012-04-05: qty 1

## 2012-04-05 MED ORDER — ALUM & MAG HYDROXIDE-SIMETH 200-200-20 MG/5ML PO SUSP: 30.0000 mL | ORAL | Status: DC | PRN

## 2012-04-05 MED ORDER — ACETAMINOPHEN 10 MG/ML IV SOLN
1000.0000 mg | Freq: Four times a day (QID) | INTRAVENOUS | Status: AC
  Administered 2012-04-05 – 2012-04-06 (×3): 1000 mg via INTRAVENOUS
  Filled 2012-04-05 (×3): qty 100

## 2012-04-05 MED ORDER — MORPHINE SULFATE 4 MG/ML IJ SOLN
4.0000 mg | INTRAMUSCULAR | Status: DC | PRN
  Filled 2012-04-05: qty 1

## 2012-04-05 MED ORDER — SODIUM CHLORIDE 0.9 % IR SOLN
Status: DC | PRN
  Administered 2012-04-05 (×2): 1000 mL

## 2012-04-05 MED ORDER — POTASSIUM CHLORIDE IN NACL 20-0.9 MEQ/L-% IV SOLN
INTRAVENOUS | Status: DC
  Administered 2012-04-05 – 2012-04-07 (×3): via INTRAVENOUS

## 2012-04-05 MED ORDER — PHENOL 1.4 % MT LIQD: 1.0000 | OROMUCOSAL | Status: DC | PRN

## 2012-04-05 MED ORDER — ONDANSETRON HCL 4 MG/2ML IJ SOLN: 4.0000 mg | Freq: Four times a day (QID) | INTRAMUSCULAR | Status: DC | PRN

## 2012-04-05 MED ORDER — ONDANSETRON HCL 4 MG/2ML IJ SOLN
INTRAMUSCULAR | Status: AC
  Filled 2012-04-05: qty 2

## 2012-04-05 MED ORDER — GLYCOPYRROLATE 0.2 MG/ML IJ SOLN
INTRAMUSCULAR | Status: DC | PRN
  Administered 2012-04-05: 0.2 mg via INTRAVENOUS

## 2012-04-05 SURGICAL SUPPLY — 65 items
BAG HAMPER (MISCELLANEOUS) ×3 IMPLANT
BANDAGE GAUZE ELAST BULKY 4 IN (GAUZE/BANDAGES/DRESSINGS) IMPLANT
BIT DRILL 6.5 DISP STRL (BIT) ×3 IMPLANT
BIT DRILL AO GAMMA 4.2X180 (BIT) ×3 IMPLANT
BIT DRILL AO GAMMA 4.2X300 (BIT) ×3 IMPLANT
BLADE SURG SZ10 CARB STEEL (BLADE) ×6 IMPLANT
BLADE SURG SZ20 CARB STEEL (BLADE) ×3 IMPLANT
CHLORAPREP W/TINT 26ML (MISCELLANEOUS) ×3 IMPLANT
CLOTH BEACON ORANGE TIMEOUT ST (SAFETY) ×3 IMPLANT
COVER LIGHT HANDLE STERIS (MISCELLANEOUS) ×6 IMPLANT
COVER MAYO STAND XLG (DRAPE) ×3 IMPLANT
DECANTER SPIKE VIAL GLASS SM (MISCELLANEOUS) ×6 IMPLANT
DRAPE STERI IOBAN 125X83 (DRAPES) ×3 IMPLANT
DRILL OMEGA BONE (BIT) ×3 IMPLANT
DRSG MEPILEX BORDER 4X12 (GAUZE/BANDAGES/DRESSINGS) ×3 IMPLANT
DRSG MEPILEX BORDER 4X8 (GAUZE/BANDAGES/DRESSINGS) ×3 IMPLANT
ELECT REM PT RETURN 9FT ADLT (ELECTROSURGICAL) ×3
ELECTRODE REM PT RTRN 9FT ADLT (ELECTROSURGICAL) ×2 IMPLANT
GAUZE XEROFORM 5X9 LF (GAUZE/BANDAGES/DRESSINGS) IMPLANT
GLOVE BIOGEL PI IND STRL 7.0 (GLOVE) ×6 IMPLANT
GLOVE BIOGEL PI INDICATOR 7.0 (GLOVE) ×3
GLOVE ECLIPSE 6.5 STRL STRAW (GLOVE) ×3 IMPLANT
GLOVE EXAM NITRILE MD LF STRL (GLOVE) ×3 IMPLANT
GLOVE OPTIFIT SS 6.5 STRL BRWN (GLOVE) ×3 IMPLANT
GLOVE SKINSENSE NS SZ8.0 LF (GLOVE) ×2
GLOVE SKINSENSE STRL SZ8.0 LF (GLOVE) ×4 IMPLANT
GLOVE SS N UNI LF 8.5 STRL (GLOVE) ×6 IMPLANT
GOWN STRL REIN XL XLG (GOWN DISPOSABLE) ×12 IMPLANT
GUIDE PIN CALIBRATED (PIN) ×3 IMPLANT
GUIDEROD T2 3X1000 (ROD) ×3 IMPLANT
INST SET MAJOR BONE (KITS) ×3 IMPLANT
K-WIRE  3.2X450M STR (WIRE) ×1
K-WIRE 3.2X450M STR (WIRE) ×2
KIT BLADEGUARD II DBL (SET/KITS/TRAYS/PACK) ×3 IMPLANT
KIT ROOM TURNOVER AP CYSTO (KITS) ×3 IMPLANT
KWIRE 3.2X450M STR (WIRE) ×2 IMPLANT
MANIFOLD NEPTUNE II (INSTRUMENTS) ×3 IMPLANT
MARKER SKIN DUAL TIP RULER LAB (MISCELLANEOUS) ×3 IMPLANT
NAIL GAMMA 3  125DEG 11X380MM (Nail) ×3 IMPLANT
NEEDLE HYPO 21X1.5 SAFETY (NEEDLE) ×6 IMPLANT
NEEDLE SPNL 18GX3.5 QUINCKE PK (NEEDLE) ×3 IMPLANT
NS IRRIG 1000ML POUR BTL (IV SOLUTION) ×6 IMPLANT
PACK BASIC III (CUSTOM PROCEDURE TRAY) ×1
PACK SRG BSC III STRL LF ECLPS (CUSTOM PROCEDURE TRAY) ×2 IMPLANT
PAD ABD 5X9 TENDERSORB (GAUZE/BANDAGES/DRESSINGS) IMPLANT
PAD ARMBOARD 7.5X6 YLW CONV (MISCELLANEOUS) ×3 IMPLANT
PENCIL HANDSWITCHING (ELECTRODE) ×3 IMPLANT
SCREW LAG GAMMA 3 TI 10.5X80MM (Screw) ×3 IMPLANT
SCREW LOCKING T2 F/T  5MMX45MM (Screw) ×2 IMPLANT
SCREW LOCKING T2 F/T 5MMX45MM (Screw) ×4 IMPLANT
SET BASIN LINEN APH (SET/KITS/TRAYS/PACK) ×3 IMPLANT
SPONGE GAUZE 4X4 12PLY (GAUZE/BANDAGES/DRESSINGS) IMPLANT
SPONGE LAP 18X18 X RAY DECT (DISPOSABLE) ×6 IMPLANT
STAPLER VISISTAT 35W (STAPLE) ×6 IMPLANT
SUT BRALON NAB BRD #1 30IN (SUTURE) ×3 IMPLANT
SUT ETHILON 3 0 FSL (SUTURE) ×3 IMPLANT
SUT MNCRL 0 VIOLET CTX 36 (SUTURE) ×4 IMPLANT
SUT MON AB 0 CT1 (SUTURE) IMPLANT
SUT MON AB 2-0 CT1 36 (SUTURE) ×6 IMPLANT
SUT MONOCRYL 0 CTX 36 (SUTURE) ×2
SYR 30ML LL (SYRINGE) ×3 IMPLANT
SYR BULB IRRIGATION 50ML (SYRINGE) ×6 IMPLANT
TAPE MEDIFIX FOAM 3 (GAUZE/BANDAGES/DRESSINGS) IMPLANT
YANKAUER SUCT 12FT TUBE ARGYLE (SUCTIONS) ×3 IMPLANT
YANKAUER SUCT BULB TIP 10FT TU (MISCELLANEOUS) IMPLANT

## 2012-04-05 NOTE — Anesthesia Postprocedure Evaluation (Signed)
  Anesthesia Post-op Note  Patient: Alejandra Cruz  Procedure(s) Performed: Procedure(s) (LRB): OPEN REDUCTION INTERNAL FIXATION HIP (Right)  Patient Location: PACU  Anesthesia Type: General  Level of Consciousness: sedated and patient cooperative  Airway and Oxygen Therapy: Patient Spontanous Breathing and Patient connected to face mask oxygen  Post-op Pain: none  Post-op Assessment: Post-op Vital signs reviewed, Patient's Cardiovascular Status Stable, Respiratory Function Stable, Patent Airway and No signs of Nausea or vomiting  Post-op Vital Signs: Reviewed and stable  Complications: No apparent anesthesia complications

## 2012-04-05 NOTE — Anesthesia Preprocedure Evaluation (Addendum)
Anesthesia Evaluation  Patient identified by MRN, date of birth, ID band Patient awake    Reviewed: Allergy & Precautions, H&P , NPO status , Patient's Chart, lab work & pertinent test results  History of Anesthesia Complications Negative for: history of anesthetic complications  Airway Mallampati: II TM Distance: >3 FB Neck ROM: full    Dental  (+) Edentulous Upper and Edentulous Lower   Pulmonary neg pulmonary ROS,          Cardiovascular hypertension, Pt. on medications     Neuro/Psych Seizures -, Well Controlled,  Depression    GI/Hepatic Neg liver ROS, GERD-  Controlled,Sips with meds at 0950 this am; Last food 10:30pm on 6/28   Endo/Other    Renal/GU      Musculoskeletal   Abdominal   Peds  Hematology   Anesthesia Other Findings   Reproductive/Obstetrics                          Anesthesia Physical Anesthesia Plan  ASA: II and Emergent  Anesthesia Plan: Spinal   Post-op Pain Management:    Induction:   Airway Management Planned: Simple Face Mask  Additional Equipment:   Intra-op Plan:   Post-operative Plan:   Informed Consent: I have reviewed the patients History and Physical, chart, labs and discussed the procedure including the risks, benefits and alternatives for the proposed anesthesia with the patient or authorized representative who has indicated his/her understanding and acceptance.   Dental advisory given  Plan Discussed with: Anesthesiologist and Surgeon  Anesthesia Plan Comments:         Anesthesia Quick Evaluation

## 2012-04-05 NOTE — Brief Op Note (Signed)
04/04/2012 - 04/05/2012  2:43 PM  PATIENT:  Alejandra Cruz  78 y.o. female  PRE-OPERATIVE DIAGNOSIS:  right PERITROCHANTERIC subtrochanteric fracture  POST-OPERATIVE DIAGNOSIS:  right PERITROCHANTERIC subtrochanteric fracture  PROCEDURE:  Procedure(s) (LRB): OPEN REDUCTION INTERNAL FIXATION HIP (Right)/ IM NAIL (GAMMA) RIGHT HIP   SURGEON:  Surgeon(s) and Role:    * Vickki Hearing, MD - Primary  PHYSICIAN ASSISTANT:   ASSISTANTS: CYNTHIA WRENN   ANESTHESIA:   general  EBL:  Total I/O In: 1300 [I.V.:1300] Out: 400 [Urine:100; Blood:300]  BLOOD ADMINISTERED:300 CC PRBC  DRAINS: none   LOCAL MEDICATIONS USED:  MARCAINE   , Amount: 60 ml and OTHER WITH EPI   SPECIMEN:  No Specimen  DISPOSITION OF SPECIMEN:  N/A  COUNTS:  YES  TOURNIQUET:  * No tourniquets in log *  DICTATION: .Dragon Dictation  PLAN OF CARE: Admit to inpatient   PATIENT DISPOSITION:  PACU - hemodynamically stable.   Delay start of Pharmacological VTE agent (>24hrs) due to surgical blood loss or risk of bleeding: yes

## 2012-04-05 NOTE — H&P (Signed)
Alejandra Cruz is an 78 y.o. female.   Chief Complaint: right hip pain  HPI:  Leg Pain  The incident occurred 12 to 24 hours ago. The incident occurred at home. The injury mechanism was a fall. The pain is present in the right hip and right thigh. The quality of the pain is described as aching and stabbing. The pain is at a severity of 10/10. The pain is severe. The pain has been constant since onset. Associated symptoms include an inability to bear weight and a loss of motion. Pertinent negatives include no loss of sensation, muscle weakness, numbness or tingling.  Fall Pertinent negatives include no fever, headaches, numbness or tingling.    Past Medical History  Diagnosis Date  . Hyperlipidemia   . Hypertension   . Seizures january 2010    Pt was running into the house to turn off the stove , fell and hit her head,. Since then she has developed seizure disorder   . Seizures     Dr. Gerilyn Pilgrim   . Restless legs syndrome   . GERD (gastroesophageal reflux disease)   . Gastritis, Helicobacter pylori 2010    Rx. ABO for 10 days   . Esophageal web 2010    DILATION 16 MM  . Diverticulosis OCT 2010 TCS    SIGMOID COLON    Past Surgical History  Procedure Date  . Vesicovaginal fistula closure w/ tah 1966    bleeding   . Colonoscopy OCT 2010    TORTUOUS, Sml IH, Tics, MULTIPLE SIMPLE ADENOMAS (<6MM)  . Upper gastrointestinal endoscopy AUG 2010    SAVARY  . Abdominal hysterectomy     Family History  Problem Relation Age of Onset  . Cancer Mother     bladder   . Diabetes Mother   . Diabetes Father   . Emphysema Father   . GER disease Brother   . Colon cancer Neg Hx   . Colon polyps Neg Hx    Social History:  reports that she has never smoked. She has never used smokeless tobacco. She reports that she does not drink alcohol or use illicit drugs.  Allergies: No Known Allergies  Medications Prior to Admission  Medication Sig Dispense Refill  . aspirin 81 MG tablet Take 81 mg  by mouth every morning.       . calcium-vitamin D (OSCAL WITH D) 500-200 MG-UNIT TABS TAKE (1) TABLET BY MOUTH (3) TIMES DAILY WITH MEALS.  90 each  3  . cloNIDine (CATAPRES) 0.1 MG tablet TAKE (1) TABLET BY MOUTH AT BEDTIME.  30 tablet  3  . diltiazem (CARDIZEM CD) 120 MG 24 hr capsule Take 120 mg by mouth every morning.       . levETIRAcetam (KEPPRA) 500 MG tablet Take 500 mg by mouth 2 (two) times daily.  60 tablet  4  . lovastatin (MEVACOR) 40 MG tablet Take 80 mg by mouth at bedtime.      . Multiple Vitamin (MULTIVITAMIN) tablet Take 1 tablet by mouth every morning.      Marland Kitchen omeprazole (PRILOSEC) 20 MG capsule       . PROZAC 10 MG capsule TAKE 1 CAPSULE BY MOUTH ONCE A DAY IN THE MORNING AS DIRECTED.  30 each  3    Results for orders placed during the hospital encounter of 04/04/12 (from the past 48 hour(s))  URINALYSIS, ROUTINE W REFLEX MICROSCOPIC     Status: Abnormal   Collection Time   04/04/12  6:16 PM  Component Value Range Comment   Color, Urine AMBER (*) YELLOW BIOCHEMICALS MAY BE AFFECTED BY COLOR   APPearance CLEAR  CLEAR    Specific Gravity, Urine 1.010  1.005 - 1.030    pH 8.5 (*) 5.0 - 8.0    Glucose, UA NEGATIVE  NEGATIVE mg/dL    Hgb urine dipstick NEGATIVE  NEGATIVE    Bilirubin Urine NEGATIVE  NEGATIVE    Ketones, ur TRACE (*) NEGATIVE mg/dL    Protein, ur NEGATIVE  NEGATIVE mg/dL    Urobilinogen, UA 1.0  0.0 - 1.0 mg/dL    Nitrite NEGATIVE  NEGATIVE    Leukocytes, UA NEGATIVE  NEGATIVE MICROSCOPIC NOT DONE ON URINES WITH NEGATIVE PROTEIN, BLOOD, LEUKOCYTES, NITRITE, OR GLUCOSE <1000 mg/dL.  CBC     Status: Abnormal   Collection Time   04/04/12  6:49 PM      Component Value Range Comment   WBC 7.2  4.0 - 10.5 K/uL    RBC 3.88  3.87 - 5.11 MIL/uL    Hemoglobin 11.6 (*) 12.0 - 15.0 g/dL    HCT 65.7 (*) 84.6 - 46.0 %    MCV 89.4  78.0 - 100.0 fL    MCH 29.9  26.0 - 34.0 pg    MCHC 33.4  30.0 - 36.0 g/dL    RDW 96.2  95.2 - 84.1 %    Platelets 188  150 - 400  K/uL   DIFFERENTIAL     Status: Normal   Collection Time   04/04/12  6:49 PM      Component Value Range Comment   Neutrophils Relative 66  43 - 77 %    Neutro Abs 4.7  1.7 - 7.7 K/uL    Lymphocytes Relative 25  12 - 46 %    Lymphs Abs 1.8  0.7 - 4.0 K/uL    Monocytes Relative 8  3 - 12 %    Monocytes Absolute 0.6  0.1 - 1.0 K/uL    Eosinophils Relative 0  0 - 5 %    Eosinophils Absolute 0.0  0.0 - 0.7 K/uL    Basophils Relative 0  0 - 1 %    Basophils Absolute 0.0  0.0 - 0.1 K/uL   COMPREHENSIVE METABOLIC PANEL     Status: Abnormal   Collection Time   04/04/12  6:49 PM      Component Value Range Comment   Sodium 143  135 - 145 mEq/L    Potassium 4.1  3.5 - 5.1 mEq/L    Chloride 104  96 - 112 mEq/L    CO2 27  19 - 32 mEq/L    Glucose, Bld 102 (*) 70 - 99 mg/dL    BUN 19  6 - 23 mg/dL    Creatinine, Ser 3.24  0.50 - 1.10 mg/dL    Calcium 9.3  8.4 - 40.1 mg/dL    Total Protein 6.8  6.0 - 8.3 g/dL    Albumin 3.6  3.5 - 5.2 g/dL    AST 23  0 - 37 U/L    ALT 13  0 - 35 U/L    Alkaline Phosphatase 69  39 - 117 U/L    Total Bilirubin 0.4  0.3 - 1.2 mg/dL    GFR calc non Af Amer 62 (*) >90 mL/min    GFR calc Af Amer 72 (*) >90 mL/min   PROTIME-INR     Status: Abnormal   Collection Time   04/04/12  6:49 PM  Component Value Range Comment   Prothrombin Time 15.3 (*) 11.6 - 15.2 seconds    INR 1.18  0.00 - 1.49   APTT     Status: Normal   Collection Time   04/04/12  6:49 PM      Component Value Range Comment   aPTT 34  24 - 37 seconds   TYPE AND SCREEN     Status: Normal   Collection Time   04/04/12  6:50 PM      Component Value Range Comment   ABO/RH(D) O POS      Antibody Screen NEG      Sample Expiration 04/07/2012     CALCIUM     Status: Normal   Collection Time   04/04/12  9:25 PM      Component Value Range Comment   Calcium 8.8  8.4 - 10.5 mg/dL   SURGICAL PCR SCREEN     Status: Normal   Collection Time   04/05/12 12:20 AM      Component Value Range Comment   MRSA,  PCR NEGATIVE  NEGATIVE    Staphylococcus aureus NEGATIVE  NEGATIVE    Dg Chest 1 View  04/04/2012  *RADIOLOGY REPORT*  Clinical Data: History of fall.  Confusion.  CHEST - 1 VIEW  Comparison: Chest x-ray 10/13/2008.  Findings: Lung volumes are normal.  No consolidative airspace disease.  No pleural effusions.  No pneumothorax.  No pulmonary nodule or mass noted.  Pulmonary vasculature and the cardiomediastinal silhouette are within normal limits. Atherosclerotic calcifications are noted within the arch of the aorta.  Degenerative changes of osteoarthritis are incidentally noted in the glenohumeral joints bilaterally.  IMPRESSION: 1. No radiographic evidence of acute cardiopulmonary disease. 2.  Atherosclerosis.  Original Report Authenticated By: Florencia Reasons, M.D.   Dg Hip Complete Right  04/04/2012  *RADIOLOGY REPORT*  Clinical Data: , fall  RIGHT HIP - COMPLETE 2+ VIEW  Comparison: None  Findings: Osseous demineralization. Symmetric mildly narrowed hip joints bilaterally. Displaced intertrochanteric fracture right femur with varus angulation. A large long oblique fragment and fluid which includes the lesser trochanter is displaced medially. No femoral head dislocation. Pelvis appears intact.  IMPRESSION: Displaced and angulated intertrochanteric fracture right femur as above.  Original Report Authenticated By: Lollie Marrow, M.D.   Dg Femur Right  04/04/2012  *RADIOLOGY REPORT*  Clinical Data: Hip pain, fall  RIGHT FEMUR - 2 VIEW  Comparison: Right hip radiographs 04/04/2012  Findings: Displaced intertrochanteric fracture proximal right femur, reported separately. Remainder of right femur appears intact. Joint space narrowing right knee. No additional femoral fracture or dislocation is identified distally. Osseous demineralization.  IMPRESSION: Displaced intertrochanteric fracture right femur, please see dedicated right hip radiographs report. No additional right femoral abnormalities identified.   Original Report Authenticated By: Lollie Marrow, M.D.   Dg Tibia/fibula Right  04/04/2012  *RADIOLOGY REPORT*  Clinical Data: History of fall.  RIGHT TIBIA AND FIBULA - 2 VIEW  Comparison: No priors.  Findings: Two views of the right tibia and fibula demonstrate no acute displaced fracture.  Overlying soft tissues are unremarkable.  IMPRESSION: 1.  Negative for acute displaced fracture.  Original Report Authenticated By: Florencia Reasons, M.D.    Review of Systems  Constitutional: Negative for fever.  Eyes: Negative for discharge.  Respiratory: Negative for cough.   Cardiovascular: Negative for chest pain.  Gastrointestinal: Negative for melena.  Genitourinary: Negative for dysuria.  Musculoskeletal: Positive for myalgias, joint pain and falls.  Skin: Negative for  rash.  Neurological: Positive for dizziness and focal weakness. Negative for tingling, seizures, numbness and headaches.       Patient reports no witnessed seizures   Endo/Heme/Allergies: Does not bruise/bleed easily.  Psychiatric/Behavioral: Negative for depression and memory loss. The patient does not have insomnia.     Blood pressure 103/64, pulse 59, temperature 98.1 F (36.7 C), temperature source Oral, resp. rate 18, height 5\' 9"  (1.753 m), weight 80.377 kg (177 lb 3.2 oz), SpO2 99.00%. Physical Exam  Constitutional: She is oriented to person, place, and time. She appears well-developed and well-nourished.  HENT:  Head: Normocephalic and atraumatic.       Larey Seat a few weeks back scar on forehead   Eyes: Pupils are equal, round, and reactive to light.  Neck: Normal range of motion.  Cardiovascular: Normal rate and intact distal pulses.   Respiratory: Effort normal.  GI: Soft. She exhibits no distension.  Musculoskeletal:       Right shoulder: Normal.       Left shoulder: Normal.       Right elbow: Normal.      Left elbow: Normal.       Right hip: She exhibits decreased range of motion, decreased strength,  tenderness, bony tenderness, swelling, crepitus and deformity.       Left hip: Normal. She exhibits normal range of motion, normal strength, no tenderness, no bony tenderness, no swelling, no crepitus and no deformity.       Right knee: Normal.       Left knee: Normal.       Right ankle: Normal.       Left ankle: Normal.       Right upper arm: Normal.       Left upper arm: Normal.       Right forearm: Normal.       Left forearm: Normal.       Right upper leg: Normal.       Left upper leg: Normal.       Right lower leg: Normal.       Left lower leg: Normal.       Right foot: Normal.       Left foot: Normal.  Neurological: She is alert and oriented to person, place, and time. She has normal reflexes. She displays normal reflexes. No cranial nerve deficit. She exhibits normal muscle tone. Coordination normal.  Skin: Skin is dry.  Psychiatric: She has a normal mood and affect. Her behavior is normal. Judgment and thought content normal.     Assessment/Plan Peritrochanteric fracture right hip severely comminuted   OTIF RIGHT HIP   RISKS OF SURGERY EXPLAINED + SEVERITY OF INJURY, POTENTIAL PROBLEMS NON HEALING, LLD, PE, DVT, BLEEDING  OTHER PROBLEMS AS WELL WHICH CAN NOT BE ANTICIPATED   Fuller Canada 04/05/2012, 10:52 AM

## 2012-04-05 NOTE — Anesthesia Procedure Notes (Addendum)
Spinal  Start time: 04/05/2012 11:55 AM Staffing CRNA/Resident: ANDRAZA, Omarr Hann L Preanesthetic Checklist Completed: patient identified, site marked, surgical consent, pre-op evaluation, timeout performed, IV checked, risks and benefits discussed and monitors and equipment checked Spinal Block Patient position: right lateral decubitus Prep: Betadine Patient monitoring: cardiac monitor, heart rate, continuous pulse ox and blood pressure Approach: right paramedian Location: L3-4 Injection technique: single-shot Needle Needle type: Spinocan  Needle gauge: 22 G Additional Notes Attempt x3 to place spinal without success.  Patient tolerated well.  Procedure Name: Intubation Date/Time: 04/05/2012 12:18 PM Performed by: Carolyne Littles, Anacristina Steffek L Pre-anesthesia Checklist: Patient identified, Patient being monitored, Timeout performed, Emergency Drugs available and Suction available Patient Re-evaluated:Patient Re-evaluated prior to inductionOxygen Delivery Method: Circle System Utilized Preoxygenation: Pre-oxygenation with 100% oxygen Intubation Type: IV induction, Cricoid Pressure applied and Rapid sequence Laryngoscope Size: 3 and Perfect Grade View: Grade I Tube type: Oral Tube size: 7.0 mm Number of attempts: 1 Airway Equipment and Method: stylet Placement Confirmation: ETT inserted through vocal cords under direct vision,  positive ETCO2 and breath sounds checked- equal and bilateral Secured at: 21 cm Tube secured with: Tape Dental Injury: Teeth and Oropharynx as per pre-operative assessment

## 2012-04-05 NOTE — Transfer of Care (Signed)
Immediate Anesthesia Transfer of Care Note  Patient: Alejandra Cruz  Procedure(s) Performed: Procedure(s) (LRB): OPEN REDUCTION INTERNAL FIXATION HIP (Right)  Patient Location: PACU  Anesthesia Type: General  Level of Consciousness: sedated and patient cooperative  Airway & Oxygen Therapy: Patient Spontanous Breathing and Patient connected to face mask oxygen  Post-op Assessment: Report given to PACU RN and Post -op Vital signs reviewed and stable  Post vital signs: Reviewed and stable  Complications: No apparent anesthesia complications

## 2012-04-05 NOTE — Clinical Social Work Psychosocial (Addendum)
     Clinical Social Work Department BRIEF PSYCHOSOCIAL ASSESSMENT 04/07/2012  Patient:  Alejandra Cruz, Alejandra Cruz     Account Number:  [id number]     Admit date:  04/04/2012  Clinical Social Worker:  Burnard Hawthorne  Date/Time:  04/05/2012 02:34 PM  Referred by:  Physician  Date Referred:  04/05/2012 Referred for  SNF Placement   Other Referral:   Interview type:  Family Other interview type:   Patient was in surgery    PSYCHOSOCIAL DATA Living Status:  ALONE Admitted from facility:   Level of care:   Primary support name:  Philomena Doheny  [phone] (c) Primary support relationship to patient:  CHILD, ADULT Degree of support available:   Strong, Supportive family    [id number] [place]  (c) [phone]    CURRENT CONCERNS Current Concerns  Post-Acute Placement   Other Concerns:   SNF placement will most likely be indicated;  PT will need to see and make recommendation    SOCIAL WORK ASSESSMENT / PLAN CSW refered to assist with patient with SNF placement. Patient was in surgery during this social worker's visit- however, her son and daughter were in room and were agreeable to discuss possible options for after care. Patient lives alone but family (children, grandchildren etc) are contantly in and out of the home.  Patient was fairly independent prior to hospitalization and is normally alert and oriented per daughter.  Briefly outlined possible d/c options including short term SNF vs home with home health and DME. Patient will require PT eval and recommendations and then CSW will follow up with patient and family regarding choice.   Assessment/plan status:  Psychosocial Support/Ongoing Assessment of Needs Other assessment/ plan:   SNF vs home with HH.  At this time-- daughter and son both feel that short term SNF will probably be indicated but will still need to talk to patient.   Information/referral to community resources:   SNF bed list provided  Home Health/DME option discussed  included process for provision of services    PATIENTS/FAMILYS RESPONSE TO PLAN OF CARE: Daughter and son were both appreciative of information provided and should patient need SNF placement- would like to request the [facility].  Family lives in [place], [address] and [address].  Once patient is consulted, then can initate bed search if she agrees to SNF placement. There is no current HCPOA.

## 2012-04-06 LAB — BASIC METABOLIC PANEL
CO2: 29 mEq/L (ref 19–32)
Chloride: 107 mEq/L (ref 96–112)
Sodium: 141 mEq/L (ref 135–145)

## 2012-04-06 LAB — CBC
Platelets: 119 10*3/uL — ABNORMAL LOW (ref 150–400)
RBC: 2.36 MIL/uL — ABNORMAL LOW (ref 3.87–5.11)
WBC: 9.5 10*3/uL (ref 4.0–10.5)

## 2012-04-06 LAB — URINE CULTURE

## 2012-04-06 MED ORDER — SODIUM CHLORIDE 0.9 % IJ SOLN
INTRAMUSCULAR | Status: AC
  Administered 2012-04-06: 10 mL
  Filled 2012-04-06: qty 3

## 2012-04-06 NOTE — Progress Notes (Signed)
Clinical Social Worker met with pt and pt's daughter to follow up regarding discharge plans.  Pt and pt's daughter would prefer short term rehab at discharge.  Pt and family identified [facility] as their first choice, but were agreeable to CSW faxing pt's information out to all facilities in [place].  [place] number received and FL2 complete and left for physicians signature.  Weekday CSW will follow up with bed offers, and will have additional information once physical therapy recommendations are made.    Tamala Julian, MSW, LCSW Clinical Social Worker Weekend Coverage

## 2012-04-06 NOTE — Addendum Note (Signed)
Addendum  created 04/06/12 1620 by Marolyn Hammock, CRNA   Modules edited:Notes Section

## 2012-04-06 NOTE — Progress Notes (Signed)
Subjective: 1 Day Post-Op Procedure(s) (LRB): OPEN REDUCTION INTERNAL FIXATION HIP (Right) Patient reports pain as mild.    Objective: Vital signs in last 24 hours: Temp:  [97 F (36.1 C)-98.1 F (36.7 C)] 97.8 F (36.6 C) (06/30 0531) Pulse Rate:  [59-85] 85  (06/30 0531) Resp:  [12-18] 18  (06/30 0531) BP: (103-132)/(45-72) 110/64 mmHg (06/30 0531) SpO2:  [0 %-100 %] 100 % (06/30 0531)  Intake/Output from previous day: 06/29 0701 - 06/30 0700 In: 2678.3 [I.V.:2678.3] Out: 1380 [Urine:1080; Blood:300] Intake/Output this shift:     Basename 04/06/12 0538 04/04/12 1849  HGB 7.0* 11.6*    Basename 04/06/12 0538 04/04/12 1849  WBC 9.5 7.2  RBC 2.36* 3.88  HCT 21.6* 34.7*  PLT 119* 188    Basename 04/06/12 0538 04/04/12 2125 04/04/12 1849  NA 141 -- 143  K 4.0 -- 4.1  CL 107 -- 104  CO2 29 -- 27  BUN 19 -- 19  CREATININE 0.72 -- 0.88  GLUCOSE 109* -- 102*  CALCIUM 7.9* 8.8 --    Basename 04/04/12 1849  LABPT --  INR 1.18    Neurologically intact Neurovascular intact Sensation intact distally Intact pulses distally Dorsiflexion/Plantar flexion intact Incision: dressing C/D/I  Assessment/Plan: 1 Day Post-Op Procedure(s) (LRB): OPEN REDUCTION INTERNAL FIXATION HIP (Right) Advance diet Transfuse 2 units rbc She has acute blood loss anemia from surgery  She will need transfusion 2 units  We can advance her diet as stated.  Start therapy tomorrow weightbearing as tolerated. Hold Lovenox secondary to hemoglobin of 7  Alejandra Cruz 04/06/2012, 7:51 AM

## 2012-04-06 NOTE — Op Note (Signed)
Details of procedure   date of procedure 04/05/2012  History 78 roll female fell at home fractured her right proximal femur/hip. She was admitted stabilized and prepped for surgery.  Indication for procedure comminuted proximal femur/hip fracture/peritrochanteric fracture right hip  Preop diagnosis peritrochanteric fracture right hip  Postop diagnosis same  Procedure open treatment internal fixation right peritrochanteric hip fracture with intramedullary nail device.  Surgeon Romeo Apple  Assisted by Cecile Sheerer  Anesthesia Gen. after failed attempt at spinal anesthetic  Blood loss estimated 300 cc  Operative findings after traction and closed reduction the fracture appeared to be a peritrochanteric femur fracture with 3 major fracture fragments and apex anterior angulation was reduced by traction.  The right hip was marked as a surgical site after confirmation with the patient and x-rays and physical examination and history. The patient was taken to the operating room and given 2 g of IV Ancef and attempted spinal anesthetic was made. This was unsuccessful the patient was intubated. On the fracture table the well left leg was placed on a padded well leg holder and the right leg was placed in traction device. The limb was then manipulated with traction and rotation until a stable reduction was obtained. This was confirmed by AP and lateral C-arm x-rays. The limb was then prepped and draped sterilely in the timeout was completed.  The incision was made over the greater trochanter extended proximally. Subcutaneous fat was divided in line with the skin incision. The fascia was then split in line with the skin incision exposing the greater trochanter. A curved awl was then placed in the proximal trochanter and confirmed to be in position with AP and lateral films. A guidewire was then passed down to the knee joint and was confirmed by x-ray. Serial reaming starting with a size 10, progressing to  an 11 and a 12 were then passed over the guidewire. Proximal portion was overreamed with the gamma nail proximal reamer. The guidewire was measured to be 380 mm. 125 380 mm nail was passed over the guidewire the guidewire was removed. Adjustments were made on the nail to get in proper position and this was confirmed by x-ray.  A lateral incision was made and taken down to bone a cannula was placed and per technique a guide pin was placed in the Center of the femoral head on AP and lateral x-ray. This was measured to be 80 mm. Triple reamer set for 80 mm and passed over the guidewire. The lag screw was placed over the guidewire the guidewire was removed. The set screw was placed proximally. It was set in dynamic mode.  We then turned our attention to the distal locking screws. One incision was made over the distal of the right leg in line with the sliding screw holes. Using the freehand technique 2 screws were placed in the locking distal holes and this was confirmed by x-ray. We then confirmed rotational position of the limb area  All wounds were then irrigated and closed as follows. The proximal wound was closed with 1 Bralon in the fascial layer and then 0 and 2-0 Monocryl in layered fashion. A lateral incision was closed with 2-0 Monocryl and staples. The distal incision was closed with 0 Monocryl and 2-0 Monocryl and staples. 60 cc of Marcaine with epinephrine was divided between the 3 wounds to assist with postop anesthesia  Sterile dressings were applied  The patient was taken off the fracture table and taken to the recovery room in stable condition  The postoperative plan is to use Lovenox and early mobilization along with mechanical compression devices for DVT prevention  Weightbearing status as tolerated

## 2012-04-06 NOTE — Anesthesia Postprocedure Evaluation (Signed)
  Anesthesia Post-op Note  Patient: Alejandra Cruz  Procedure(s) Performed: Procedure(s) (LRB): OPEN REDUCTION INTERNAL FIXATION HIP (Right)  Patient Location: Room 301  Anesthesia Type: General  Level of Consciousness: awake, alert , oriented and patient cooperative  Airway and Oxygen Therapy: Patient Spontanous Breathing and Patient connected to nasal cannula oxygen  Post-op Pain: mild  Post-op Assessment: Post-op Vital signs reviewed, Patient's Cardiovascular Status Stable, Respiratory Function Stable, Patent Airway and No signs of Nausea or vomiting  Post-op Vital Signs: Reviewed and stable  Complications: No apparent anesthesia complications

## 2012-04-07 LAB — TYPE AND SCREEN
ABO/RH(D): O POS
Antibody Screen: NEGATIVE
Unit division: 0
Unit division: 0

## 2012-04-07 LAB — BASIC METABOLIC PANEL
Chloride: 104 mEq/L (ref 96–112)
Creatinine, Ser: 0.63 mg/dL (ref 0.50–1.10)
GFR calc Af Amer: >90 mL/min (ref >90)
Potassium: 3.7 mEq/L (ref 3.5–5.1)
Sodium: 139 mEq/L (ref 135–145)

## 2012-04-07 LAB — CBC
Platelets: 102 10*3/uL — ABNORMAL LOW (ref 150–400)
RDW: 15.3 % (ref 11.5–15.5)
WBC: 9.9 10*3/uL (ref 4.0–10.5)

## 2012-04-07 NOTE — Progress Notes (Signed)
Subjective: 2 Days Post-Op Procedure(s) (LRB): OPEN REDUCTION INTERNAL FIXATION HIP (Right) Patient reports pain as mild.    Objective: Vital signs in last 24 hours: Temp:  [98 F (36.7 C)-100.5 F (38.1 C)] 100.5 F (38.1 C) (07/01 0407) Pulse Rate:  [85-103] 85  (07/01 0407) Resp:  [14-18] 18  (07/01 0407) BP: (93-125)/(58-77) 105/63 mmHg (07/01 0407) SpO2:  [95 %-100 %] 97 % (07/01 0407)  Intake/Output from previous day: 06/30 0701 - 07/01 0700 In: 1150 [P.O.:150; Blood:700; IV Piggyback:300] Out: 475 [Urine:475] Intake/Output this shift:     Basename 04/07/12 0507 04/06/12 0538 04/04/12 1849  HGB 8.2* 7.0* 11.6*    Basename 04/07/12 0507 04/06/12 0538  WBC 9.9 9.5  RBC 2.77* 2.36*  HCT 24.4* 21.6*  PLT 102* 119*    Basename 04/07/12 0507 04/06/12 0538  NA 139 141  K 3.7 4.0  CL 104 107  CO2 30 29  BUN 11 19  CREATININE 0.63 0.72  GLUCOSE 105* 109*  CALCIUM 8.0* 7.9*    Basename 04/04/12 1849  LABPT --  INR 1.18    Neurologically intact Neurovascular intact Sensation intact distally Intact pulses distally Dorsiflexion/Plantar flexion intact Incision: dressing C/D/I  Assessment/Plan: 2 Days Post-Op Procedure(s) (LRB): OPEN REDUCTION INTERNAL FIXATION HIP (Right) Advance diet Up with therapy D/C IV fluids  Fuller Canada 04/07/2012, 9:05 AM

## 2012-04-07 NOTE — Care Management Note (Signed)
    Page 1 of 1   04/08/2012     9:26:28 AM   CARE MANAGEMENT NOTE 04/08/2012  Patient:  Alejandra Cruz, Alejandra Cruz   Account Number:  [id number]  Date Initiated:  04/07/2012  Documentation initiated by:  Sharrie Rothman  Subjective/Objective Assessment:   Pt admitted from home with right hip fracture. Pt s/p OTIF. Pt is agreeable for SNF.     Action/Plan:   CSW is aware and will start [facility] bed search.   Anticipated DC Date:  04/08/2012   Anticipated DC Plan:  SKILLED NURSING FACILITY  In-house referral  Clinical Social Worker      DC Planning Services  CM consult      Choice offered to / List presented to:             Status of service:  Completed, signed off Medicare Important Message given?   (If response is "NO", the following Medicare IM given date fields will be blank) Date Medicare IM given:   Date Additional Medicare IM given:    Discharge Disposition:  SKILLED NURSING FACILITY  Per UR Regulation:    If discussed at Long Length of Stay Meetings, dates discussed:    Comments:  04/08/12 0925 Arlyss Queen, RN BSN CM Pt discharged to [facility] today. CSW will make discharge arrangements to the facility.  04/07/12 1028 Arlyss Queen, RN BSN CM

## 2012-04-07 NOTE — Clinical Social Work Note (Signed)
Spoke w son and gave him bed offer from [facility], [place] Placement Handbook and contact information for [place].  Advised son that family would need to sign admission paperwork for facility.  Son agreed that he and his sister would contact facility.    Clovis Cao Clinical Social Worker [phone])

## 2012-04-07 NOTE — Progress Notes (Signed)
UR chart review completed.  

## 2012-04-07 NOTE — Evaluation (Signed)
Physical Therapy Evaluation Patient Details Name: Alejandra Cruz MRN: [id number] DOB: [birth year] Today's Date: 04/07/2012 Time: 0825-0908 PT Time Calculation (min): 43 min  PT Assessment / Plan / Recommendation Clinical Impression  Pt with limitied mobility who will need skilled therapy in acute as well as SNF at discharge to maximize functional ability    PT Assessment  Patient needs continued PT services    Follow Up Recommendations  Skilled nursing facility    Barriers to Discharge  mobility      Equipment Recommendations  Defer to next venue    Recommendations for Other Services OT consult   Frequency Min 5X/week    Precautions / Restrictions Precautions Precautions: Fall Restrictions Weight Bearing Restrictions: Yes RLE Weight Bearing: Weight bearing as tolerated     Mobility  Bed Mobility Bed Mobility: Supine to Sit;Sitting - Scoot to [place] of Bed;Scooting to HOB Supine to Sit: 2: Max assist Sitting - Scoot to [place] of Bed: 2: Max assist Scooting to [facility]: 2: Max assist Transfers Transfers: Sit to Stand;Stand to [employer] Transfers Sit to Stand: 2: Max assist Stand to Sit: 4: Min assist Stand Pivot Transfers: 2: Max assist Ambulation/Gait Ambulation/Gait Assistance: Not tested (comment) Ambulation/Gait Assistance Details: Pt unable to stand I at this point will try ambulation later.    Exercises General Exercises - Lower Extremity Ankle Circles/Pumps: 10 reps;AAROM Quad Sets: AAROM;10 reps Gluteal Sets: AAROM;10 reps Hip ABduction/ADduction: AAROM;10 reps   PT Diagnosis: Difficulty walking;Generalized weakness  PT Problem List: Decreased strength;Decreased range of motion;Decreased activity tolerance;Decreased balance;Decreased mobility;Decreased safety awareness;Decreased coordination PT Treatment Interventions: Gait training;Therapeutic activities;Therapeutic exercise;Patient/family education   PT Goals Acute Rehab PT Goals PT Goal Formulation: With  patient Potential to Achieve Goals: Good Pt will go Supine/Side to Sit: with mod assist PT Goal: Supine/Side to Sit - Progress: Goal set today Pt will Sit at Edge of Bed: Independently;with no upper extremity support PT Goal: Sit at Edge Of Bed - Progress: Goal set today Pt will go Sit to Supine/Side: with mod assist PT Goal: Sit to Supine/Side - Progress: Goal set today Pt will go Sit to Stand: with mod assist PT Goal: Sit to Stand - Progress: Goal set today Pt will go Stand to Sit: with min assist PT Goal: Stand to Sit - Progress: Goal set today Pt will Transfer Bed to Chair/Chair to Bed: with mod assist PT Transfer Goal: Bed to Chair/Chair to Bed - Progress: Goal set today Pt will Ambulate: 1 - 15 feet;with rolling walker;with mod assist PT Goal: Ambulate - Progress: Goal set today Pt will Perform Home Exercise Program: with min assist PT Goal: Perform Home Exercise Program - Progress: Goal set today  Visit Information  Last PT Received On: 04/07/12 Assistance Needed: +1    Subjective Data  Subjective: Pt states that she usually uses a quad cane.  Pt does not have a walker Patient Stated Goal: To walk again   Prior Functioning  Home Living Lives With: Alone Available Help at Discharge: Available PRN/intermittently Type of Home: Apartment Home Access: Level entry Home Layout: One level Bathroom Shower/Tub: Engineer, manufacturing systems: Standard Home Adaptive Equipment: Quad cane Prior Function Level of Independence: Independent with assistive device(s) Able to Take Stairs?: No Driving: No Vocation: Retired Musician: No difficulties Dominant Hand: Right    Cognition  Overall Cognitive Status: Appears within functional limits for tasks assessed/performed Arousal/Alertness: Awake/alert Orientation Level: Oriented X4 / Intact Behavior During Session: [facility] for tasks performed    Extremity/Trunk  Assessment Right Lower Extremity Assessment RLE  ROM/Strength/Tone: Deficits RLE ROM/Strength/Tone Deficits: strength 2-/5; ROM limited by pain Left Lower Extremity Assessment LLE ROM/Strength/Tone: Deficits LLE ROM/Strength/Tone Deficits: limited due to pain/sitffness;  ROM wnl; strength 2+/5   Balance    End of Session PT - End of Session Equipment Utilized During Treatment: Gait belt Activity Tolerance: Patient tolerated treatment well Patient left: in bed;with call bell/phone within reach  GP     Lindaann Gradilla,CINDY 04/07/2012, 9:12 AM

## 2012-04-07 NOTE — Clinical Social Work Placement (Signed)
    Clinical Social Work Department CLINICAL SOCIAL WORK PLACEMENT NOTE 04/08/2012  Patient:  Alejandra Cruz, Alejandra Cruz  Account Number:  [id number] Admit date:  04/04/2012  Clinical Social Worker:  Santa Genera, CLINICAL SOCIAL WORKER  Date/time:  04/07/2012 02:30 PM  Clinical Social Work is seeking post-discharge placement for this patient at the following level of care:   SKILLED NURSING   (*CSW will update this form in Epic as items are completed)   04/07/2012  Patient/family provided with [facility] Department of Clinical Social Work's list of facilities offering this level of care within the geographic area requested by the patient (or if unable, by the patient's family).  04/07/2012  Patient/family informed of their freedom to choose among providers that offer the needed level of care, that participate in Medicare, Medicaid or managed care program needed by the patient, have an available bed and are willing to accept the patient.  04/07/2012  Patient/family informed of [facility]' ownership interest in [facility], as well as of the fact that they are under no obligation to receive care at this facility.  PASARR submitted to EDS on 04/06/2012 PASARR number received from EDS on 04/06/2012  FL2 transmitted to all facilities in geographic area requested by pt/family on  04/06/2012 FL2 transmitted to all facilities within larger geographic area on   Patient informed that his/her managed care company has contracts with or will negotiate with  certain facilities, including the following:   NA.  Family advised to discuss financial arrangements w [facility], as physician has stated that patient may need 3 - 4 months of rehab at [facility] before returning home.     Patient/family informed of bed offers received:  04/07/2012 Patient chooses bed at [facility] Physician recommends and patient chooses bed at    Patient to be transferred to [facility] on  04/08/2012 Patient to  be transferred to facility by [facility] staff via tunnel  The following physician request were entered in Epic:   Additional Comments: Family informed by VM to [facility].  Facility informed by VM to [facility].  Clovis Cao Clinical Social Worker [phone])

## 2012-04-07 NOTE — Clinical Social Work Note (Signed)
Kerri from [facility] confirmed that they will take patient at discharge.  Family and patient informed.    Clovis Cao Clinical Social Worker [phone])

## 2012-04-07 NOTE — Evaluation (Signed)
Occupational Therapy Evaluation Patient Details Name: Alejandra Cruz MRN: [id number] DOB: [birth year] Today's Date: 04/07/2012 Time: 9629-5284 OT Time Calculation (min): 35 min  OT Assessment / Plan / Recommendation Clinical Impression  Patient is a 78 y/o female presenting to acute OT with deficits below. Patient will benefit from acute OT to increase functiona performance with BADL.    OT Assessment  Patient needs continued OT Services    Follow Up Recommendations  Skilled nursing facility    Barriers to Discharge Decreased caregiver support     Equipment Recommendations  Defer to next venue       Frequency  Min 1X/week    Precautions / Restrictions Precautions Precautions: Posterior Hip;Fall   Pertinent Vitals/Pain Patient reports no pain upon arrival. Patient did experience pain with movement and bed mobility.    ADL  Grooming: Simulated;Minimal assistance Where Assessed - Grooming: Supported sitting Upper Body Bathing: Simulated;Minimal assistance Where Assessed - Upper Body Bathing: Supported sitting Lower Body Bathing: Simulated;+1 Total assistance Lower Body Bathing: Patient Percentage: 20% Where Assessed - Lower Body Bathing: Supported sit to stand Upper Body Dressing: Performed;Minimal assistance Where Assessed - Upper Body Dressing: Supported sitting Lower Body Dressing: Simulated;+2 Total assistance Lower Body Dressing: Patient Percentage: 0% Where Assessed - Lower Body Dressing: Supported sit to Pharmacist, hospital: Performed;+2 Total assistance (to recliner) Toilet Transfer: Patient Percentage: 10% Toilet Transfer Method: Sit to stand;Stand pivot Toileting - Architect and Hygiene: Simulated;+2 Total assistance Toileting - Architect and Hygiene: Patient Percentage: 0% Where Assessed - Toileting Clothing Manipulation and Hygiene: Standing Transfers/Ambulation Related to ADLs: Patient transfers at a Total Assist x2 level with RW.    OT Diagnosis: Generalized weakness;Acute pain  OT Problem List: Decreased strength;Decreased activity tolerance;Impaired balance (sitting and/or standing);Decreased safety awareness;Decreased knowledge of use of DME or AE;Decreased knowledge of precautions;Pain OT Treatment Interventions: Self-care/ADL training;Therapeutic exercise;Energy conservation;DME and/or AE instruction;Therapeutic activities;Balance training;Patient/family education   OT Goals Acute Rehab OT Goals OT Goal Formulation: With patient Time For Goal Achievement: 04/21/12 Potential to Achieve Goals: Fair ADL Goals Pt Will Perform Lower Body Bathing: with max assist;Sit to stand from bed;Supported;with adaptive equipment ADL Goal: Lower Body Bathing - Progress: Goal set today Pt Will Perform Lower Body Dressing: with max assist;Sit to stand from bed;Supported;with adaptive equipment ADL Goal: Lower Body Dressing - Progress: Goal set today Pt Will Transfer to Toilet: with max assist;Stand pivot transfer;with DME;3-in-1;Maintaining hip precautions;Grab bars ADL Goal: Toilet Transfer - Progress: Goal set today Pt Will Perform Toileting - Clothing Manipulation: with max assist;Sitting on 3-in-1 or toilet ADL Goal: Toileting - Clothing Manipulation - Progress: Goal set today Pt Will Perform Toileting - Hygiene: with max assist;Sit to stand from 3-in-1/toilet ADL Goal: Toileting - Hygiene - Progress: Goal set today Pt Will Perform Tub/Shower Transfer: Tub transfer;with max assist;Stand pivot transfer;Transfer tub bench;Grab bars;Maintaining hip precautions ADL Goal: Tub/Shower Transfer - Progress: Goal set today  Visit Information  Last OT Received On: 04/07/12 Assistance Needed: +2 PT/OT Co-Evaluation/Treatment: Yes    Subjective Data  Subjective: "I have no pain." Patient Stated Goal: To get stronger.   Prior Functioning  Home Living Lives With: Alone Available Help at Discharge: Family;Available  PRN/intermittently Type of Home: Apartment Home Access: Level entry Home Layout: One level Bathroom Shower/Tub: Engineer, manufacturing systems: Standard Home Adaptive Equipment: Quad cane Prior Function Level of Independence: Independent with assistive device(s) Able to Take Stairs?: No Driving: No Vocation: Retired Musician: No difficulties Dominant Hand: Right  Cognition  Overall Cognitive Status: Appears within functional limits for tasks assessed/performed Arousal/Alertness: Awake/alert Orientation Level: Appears intact for tasks assessed Behavior During Session: [facility] for tasks performed       Mobility Bed Mobility Bed Mobility: Rolling Left;Sitting - Scoot to Edge of Bed;Left Sidelying to Sit Rolling Left: 1: +1 Total assist;With rail Left Sidelying to Sit: 1: +1 Total assist;With rails;HOB elevated Sitting - Scoot to Edge of Bed: 1: +1 Total assist;With rail Transfers Transfers: Sit to Stand;Stand to Sit Sit to Stand: From bed;1: +2 Total assist Sit to Stand: Patient Percentage: 10% Stand to Sit: To chair/3-in-1;1: +2 Total assist Stand to Sit: Patient Percentage: 10% Details for Transfer Assistance: Patient required physical and verbal cues for hand placement and sequencing. Patient had difficulty weightbearing on right LE. Encouraged patient to weightbear with left LE.         End of Session OT - End of Session Equipment Utilized During Treatment: Gait belt Activity Tolerance: Patient limited by pain;Patient limited by fatigue Patient left: in chair;with call bell/phone within reach;with family/visitor present   Alejandra Cruz, Alejandra Cruz March 04/07/2012, 3:12 PM

## 2012-04-08 ENCOUNTER — Encounter (HOSPITAL_COMMUNITY): Payer: Self-pay | Admitting: Orthopedic Surgery

## 2012-04-08 ENCOUNTER — Inpatient Hospital Stay
Admission: RE | Admit: 2012-04-08 | Discharge: 2012-07-17 | Disposition: A | Discharge status: Home / Self Care | Payer: BC Managed Care – PPO | Source: Ambulatory Visit | Attending: Internal Medicine | Admitting: Internal Medicine

## 2012-04-08 DIAGNOSIS — I82409 Acute embolism and thrombosis of unspecified deep veins of unspecified lower extremity: Principal | ICD-10-CM

## 2012-04-08 DIAGNOSIS — S72009A Fracture of unspecified part of neck of unspecified femur, initial encounter for closed fracture: Secondary | ICD-10-CM

## 2012-04-08 DIAGNOSIS — T148XXA Other injury of unspecified body region, initial encounter: Secondary | ICD-10-CM

## 2012-04-08 DIAGNOSIS — M869 Osteomyelitis, unspecified: Secondary | ICD-10-CM

## 2012-04-08 LAB — CBC
HCT: 23.9 % — ABNORMAL LOW (ref 36.0–46.0)
Hemoglobin: 8.1 g/dL — ABNORMAL LOW (ref 12.0–15.0)
RDW: 15.2 % (ref 11.5–15.5)
WBC: 8.9 10*3/uL (ref 4.0–10.5)

## 2012-04-08 MED ORDER — TRAMADOL HCL 50 MG PO TABS: 50.0000 mg | ORAL_TABLET | Freq: Four times a day (QID) | ORAL | Status: AC

## 2012-04-08 MED ORDER — DILTIAZEM HCL ER COATED BEADS 120 MG PO CP24: 120.0000 mg | ORAL_CAPSULE | Freq: Every day | ORAL | Status: DC

## 2012-04-08 MED ORDER — LOVASTATIN 40 MG PO TABS: 40.0000 mg | ORAL_TABLET | Freq: Every day | ORAL | Status: DC

## 2012-04-08 MED ORDER — OMEPRAZOLE 20 MG PO CPDR: 20.0000 mg | DELAYED_RELEASE_CAPSULE | Freq: Every day | ORAL | Status: DC

## 2012-04-08 MED ORDER — ONE-DAILY MULTI VITAMINS PO TABS: 1.0000 | ORAL_TABLET | Freq: Every day | ORAL | Status: DC

## 2012-04-08 NOTE — Progress Notes (Signed)
UR chart review completed.  

## 2012-04-08 NOTE — Discharge Summary (Signed)
Physician Discharge Summary  Patient ID: Alejandra Cruz MRN: [id number] DOB/AGE: 05-17-1933 78 y.o.  Admit date: 04/04/2012 Discharge date: 04/08/2012  Admission Diagnoses: RIGHT HIP FRACTURE PERITROCHANTERIC   Discharge Diagnoses: SAME  POST OP ACUTE BLOOD LOSS ANEMIA  Active Problems:  * No active hospital problems. *    Discharged Condition: good  Hospital Course: UNREMARKABLE, PROGRESSED NORMALLY. REQUIRED 2 U OF PRBCS FOR HG OF 7.0 (PREOP 11.6). HG 8.0.  HOLD LOVENEOX   Consults: None  Significant Diagnostic Studies: labs:  CBC    Component Value Date/Time   WBC 8.9 04/08/2012 0527   RBC 2.70* 04/08/2012 0527   HGB 8.1* 04/08/2012 0527   HCT 23.9* 04/08/2012 0527   PLT 115* 04/08/2012 0527   MCV 88.5 04/08/2012 0527   MCH 30.0 04/08/2012 0527   MCHC 33.9 04/08/2012 0527   RDW 15.2 04/08/2012 0527   LYMPHSABS 1.8 04/04/2012 1849   MONOABS 0.6 04/04/2012 1849   EOSABS 0.0 04/04/2012 1849   BASOSABS 0.0 04/04/2012 1849     BMET    Component Value Date/Time   NA 139 04/07/2012 0507   K 3.7 04/07/2012 0507   CL 104 04/07/2012 0507   CO2 30 04/07/2012 0507   GLUCOSE 105* 04/07/2012 0507   BUN 11 04/07/2012 0507   CREATININE 0.63 04/07/2012 0507   CREATININE 0.77 11/15/2011 0907   CALCIUM 8.0* 04/07/2012 0507   GFRNONAA 85* 04/07/2012 0507   GFRAA >90 04/07/2012 0507      Treatments: surgery: OTIF RIGHT HIP LONG GAMMA NAIL   Discharge Exam: Blood pressure 158/81, pulse 93, temperature 99.2 F (37.3 C), temperature source Oral, resp. rate 20, height 5\' 9"  (1.753 m), weight 80.377 kg (177 lb 3.2 oz), SpO2 97.00%. Incision/Wound:CLEAN  Disposition: [facility]   Discharge Orders    Future Orders Please Complete By Expires   Diet - low sodium heart healthy      Call MD / Call 911      Comments:   If you experience chest pain or shortness of breath, CALL 911 and be transported to the hospital emergency room.  If you develope a fever above 101 F, pus (white drainage) or increased drainage  or redness at the wound, or calf pain, call your surgeon's office.   Constipation Prevention      Comments:   Drink plenty of fluids.  Prune juice may be helpful.  You may use a stool softener, such as Colace (over the counter) 100 mg twice a day.  Use MiraLax (over the counter) for constipation as needed.   Increase activity slowly as tolerated      Discharge instructions      Comments:   Orthopaedic Instructions for Gamma Nail   Right hip fracture   S/P LONG gamma nail   Weight bearing as tolerated   Remove staples in 8 days on July 10  F/U in 1 month for x-rays   HOLD LOVENOX 2ND TO HG 8.0     Medication List  As of 04/08/2012  8:11 AM   TAKE these medications         aspirin 81 MG tablet   Take 81 mg by mouth every morning.      calcium-vitamin D 500-200 MG-UNIT Tabs   Commonly known as: OSCAL WITH D   TAKE (1) TABLET BY MOUTH (3) TIMES DAILY WITH MEALS.      cloNIDine 0.1 MG tablet   Commonly known as: CATAPRES   TAKE (1) TABLET BY MOUTH  AT BEDTIME.      CARDIZEM CD 120 MG 24 hr capsule   Generic drug: diltiazem   Take 120 mg by mouth every morning.      diltiazem 120 MG 24 hr capsule   Commonly known as: CARDIZEM CD   Take 1 capsule (120 mg total) by mouth daily.      levETIRAcetam 500 MG tablet   Commonly known as: KEPPRA   Take 500 mg by mouth 2 (two) times daily.      lovastatin 40 MG tablet   Commonly known as: MEVACOR   Take 80 mg by mouth at bedtime.      lovastatin 40 MG tablet   Commonly known as: MEVACOR   Take 1 tablet (40 mg total) by mouth at bedtime.      multivitamin tablet   Take 1 tablet by mouth every morning.      multivitamin tablet   Take 1 tablet by mouth daily.      PRILOSEC 20 MG capsule   Generic drug: omeprazole      omeprazole 20 MG capsule   Commonly known as: PRILOSEC   Take 1 capsule (20 mg total) by mouth daily.      PROZAC 10 MG capsule   Generic drug: FLUoxetine   TAKE 1 CAPSULE BY MOUTH ONCE A DAY IN THE  MORNING AS DIRECTED.      traMADol 50 MG tablet   Commonly known as: ULTRAM   Take 1 tablet (50 mg total) by mouth every 6 (six) hours.           Follow-up Information    Follow up with Fuller Canada, MD. (1 MONTH )    Contact information:   [address] [place] [address] [phone]          Signed: Fuller Canada 04/08/2012, 8:11 AM

## 2012-04-08 NOTE — Progress Notes (Signed)
Report [facility]  

## 2012-04-08 NOTE — Clinical Social Work Note (Signed)
[facility] notified that patient is ready for discharge, left VM for Jeri Lager at [facility].  Called family and alerted them to discharge this AM.  Reviewed FL2 w RN Chales Abrahams and updated.  Prepared discharge packet.  CSW signing off unless further needs arise.  Clovis Cao Clinical Social Worker [phone])

## 2012-04-09 LAB — VITAMIN D 1,25 DIHYDROXY
Vitamin D 1, 25 (OH)2 Total: 34 pg/mL (ref 18–72)
Vitamin D3 1, 25 (OH)2: 21 pg/mL

## 2012-04-10 ENCOUNTER — Other Ambulatory Visit (HOSPITAL_BASED_OUTPATIENT_CLINIC_OR_DEPARTMENT_OTHER): Payer: Self-pay | Admitting: Internal Medicine

## 2012-04-10 ENCOUNTER — Ambulatory Visit (HOSPITAL_COMMUNITY)
Admission: RE | Admit: 2012-04-10 | Discharge: 2012-04-10 | Disposition: A | Discharge status: Home / Self Care | Payer: Medicare Other | Source: Ambulatory Visit | Attending: Internal Medicine | Admitting: Internal Medicine

## 2012-04-10 ENCOUNTER — Inpatient Hospital Stay (HOSPITAL_COMMUNITY)
Admission: RE | Admit: 2012-04-10 | Discharge: 2012-04-10 | Disposition: A | Discharge status: Home / Self Care | Payer: Medicare Other | Source: Ambulatory Visit | Attending: Internal Medicine | Admitting: Internal Medicine

## 2012-04-10 DIAGNOSIS — M25559 Pain in unspecified hip: Secondary | ICD-10-CM | POA: Insufficient documentation

## 2012-04-14 ENCOUNTER — Ambulatory Visit (HOSPITAL_COMMUNITY): Payer: Medicare Other | Attending: Internal Medicine

## 2012-04-14 DIAGNOSIS — M7989 Other specified soft tissue disorders: Secondary | ICD-10-CM | POA: Insufficient documentation

## 2012-04-14 DIAGNOSIS — Z9889 Other specified postprocedural states: Secondary | ICD-10-CM | POA: Insufficient documentation

## 2012-04-14 DIAGNOSIS — I824Y9 Acute embolism and thrombosis of unspecified deep veins of unspecified proximal lower extremity: Secondary | ICD-10-CM | POA: Insufficient documentation

## 2012-04-23 ENCOUNTER — Ambulatory Visit: Payer: BC Managed Care – PPO | Admitting: Family Medicine

## 2012-05-13 ENCOUNTER — Ambulatory Visit (HOSPITAL_COMMUNITY): Payer: Medicare Other | Attending: Internal Medicine

## 2012-05-13 DIAGNOSIS — Z4789 Encounter for other orthopedic aftercare: Secondary | ICD-10-CM | POA: Insufficient documentation

## 2012-05-13 DIAGNOSIS — M25559 Pain in unspecified hip: Secondary | ICD-10-CM | POA: Insufficient documentation

## 2012-05-14 ENCOUNTER — Encounter: Payer: Self-pay | Admitting: Orthopedic Surgery

## 2012-05-14 ENCOUNTER — Ambulatory Visit (INDEPENDENT_AMBULATORY_CARE_PROVIDER_SITE_OTHER): Payer: Medicare Other | Admitting: Orthopedic Surgery

## 2012-05-14 VITALS — BP 100/60 | Ht 69.0 in | Wt 177.0 lb

## 2012-05-14 DIAGNOSIS — S72143A Displaced intertrochanteric fracture of unspecified femur, initial encounter for closed fracture: Secondary | ICD-10-CM

## 2012-05-14 DIAGNOSIS — S72141A Displaced intertrochanteric fracture of right femur, initial encounter for closed fracture: Secondary | ICD-10-CM

## 2012-05-14 NOTE — Progress Notes (Signed)
Patient ID: Alejandra Cruz, female   DOB: 1933/03/12, 78 y.o.   MRN: [id number] Chief Complaint  Patient presents with  . Follow-up    follow up right hip gamma nail, DOS 04/05/12    BP 100/60  Ht 5\' 9"  (1.753 m)  Wt 177 lb (80.287 kg)  BMI 26.14 kg/m2  date of procedure 04/05/2012  History 78 yo female fell at home fractured her right proximal femur/hip. She was admitted stabilized and prepped for surgery.  Indication for procedure comminuted proximal femur/hip fracture/peritrochanteric fracture right hip  Preop diagnosis peritrochanteric fracture right hip  Postop diagnosis same  Procedure open treatment internal fixation right peritrochanteric hip fracture with intramedullary nail device.  Developed bed sore and heel sore   xrays show Stable fixation with partial fracture, healing.  Recommend x-ray in 6 weeks at the hospital. A followup visit in 7 weeks.

## 2012-07-01 ENCOUNTER — Ambulatory Visit (HOSPITAL_COMMUNITY)
Admit: 2012-07-01 | Discharge: 2012-07-01 | Disposition: A | Discharge status: Home / Self Care | Payer: Medicare Other | Source: Skilled Nursing Facility | Attending: Orthopedic Surgery | Admitting: Orthopedic Surgery

## 2012-07-01 ENCOUNTER — Ambulatory Visit (HOSPITAL_COMMUNITY): Payer: Medicare Other

## 2012-07-01 DIAGNOSIS — Z9889 Other specified postprocedural states: Secondary | ICD-10-CM | POA: Insufficient documentation

## 2012-07-01 DIAGNOSIS — M25559 Pain in unspecified hip: Secondary | ICD-10-CM | POA: Insufficient documentation

## 2012-07-02 ENCOUNTER — Ambulatory Visit (HOSPITAL_COMMUNITY)
Admit: 2012-07-02 | Discharge: 2012-07-02 | Disposition: A | Discharge status: Home / Self Care | Payer: Medicare Other | Source: Skilled Nursing Facility | Attending: Internal Medicine | Admitting: Internal Medicine

## 2012-07-02 ENCOUNTER — Encounter: Payer: Self-pay | Admitting: Orthopedic Surgery

## 2012-07-02 ENCOUNTER — Ambulatory Visit (INDEPENDENT_AMBULATORY_CARE_PROVIDER_SITE_OTHER): Payer: Medicare Other | Admitting: Orthopedic Surgery

## 2012-07-02 VITALS — Ht 69.0 in | Wt 177.0 lb

## 2012-07-02 DIAGNOSIS — S72141A Displaced intertrochanteric fracture of right femur, initial encounter for closed fracture: Secondary | ICD-10-CM | POA: Insufficient documentation

## 2012-07-02 DIAGNOSIS — M25579 Pain in unspecified ankle and joints of unspecified foot: Secondary | ICD-10-CM | POA: Insufficient documentation

## 2012-07-02 DIAGNOSIS — S72143A Displaced intertrochanteric fracture of unspecified femur, initial encounter for closed fracture: Secondary | ICD-10-CM

## 2012-07-02 NOTE — Progress Notes (Signed)
Patient ID: Alejandra Cruz, female   DOB: 05-31-1933, 78 y.o.   MRN: [id number] Chief Complaint  Patient presents with  . Follow-up    7 week recheck on right hip. Xrays were done at [facility].    Status post intertrochanteric fracture right hip treated with gamma nail  Date of surgery April 05 2012 Complaints: She developed a sacral pressure sore as well as a right heel pressure sore which are healing according to the patient's daughter  Ambulatory status weightbearing as tolerated with walker  X-ray shows healing of the fracture   Plan continue progressive activities as tolerated  Follow up as needed

## 2012-07-02 NOTE — Patient Instructions (Addendum)
activities as tolerated 

## 2012-07-25 ENCOUNTER — Encounter: Payer: Self-pay | Admitting: Family Medicine

## 2012-07-25 ENCOUNTER — Ambulatory Visit (INDEPENDENT_AMBULATORY_CARE_PROVIDER_SITE_OTHER): Payer: Medicare Other | Admitting: Family Medicine

## 2012-07-25 VITALS — BP 132/76 | HR 78 | Resp 18 | Ht 67.0 in | Wt 179.0 lb

## 2012-07-25 DIAGNOSIS — R569 Unspecified convulsions: Secondary | ICD-10-CM

## 2012-07-25 DIAGNOSIS — D649 Anemia, unspecified: Secondary | ICD-10-CM

## 2012-07-25 DIAGNOSIS — I10 Essential (primary) hypertension: Secondary | ICD-10-CM

## 2012-07-25 DIAGNOSIS — F3289 Other specified depressive episodes: Secondary | ICD-10-CM

## 2012-07-25 DIAGNOSIS — K219 Gastro-esophageal reflux disease without esophagitis: Secondary | ICD-10-CM

## 2012-07-25 DIAGNOSIS — F329 Major depressive disorder, single episode, unspecified: Secondary | ICD-10-CM

## 2012-07-25 DIAGNOSIS — E785 Hyperlipidemia, unspecified: Secondary | ICD-10-CM

## 2012-07-25 DIAGNOSIS — Z23 Encounter for immunization: Secondary | ICD-10-CM

## 2012-07-25 DIAGNOSIS — R5383 Other fatigue: Secondary | ICD-10-CM

## 2012-07-25 DIAGNOSIS — S72143A Displaced intertrochanteric fracture of unspecified femur, initial encounter for closed fracture: Secondary | ICD-10-CM

## 2012-07-25 DIAGNOSIS — S72141A Displaced intertrochanteric fracture of right femur, initial encounter for closed fracture: Secondary | ICD-10-CM

## 2012-07-25 DIAGNOSIS — R5381 Other malaise: Secondary | ICD-10-CM

## 2012-07-25 LAB — CBC
HCT: 37.9 % (ref 36.0–46.0)
MCHC: 33.2 g/dL (ref 30.0–36.0)
MCV: 83.7 fL (ref 78.0–100.0)
RDW: 17.3 % — ABNORMAL HIGH (ref 11.5–15.5)

## 2012-07-25 NOTE — Assessment & Plan Note (Signed)
Overall fairly well controlled, however, pt currently overwhelmed because of the fact that she is deemed no longer safe to live independently and would be better in an assisted living environ. She does understand the need, her daughter with her states she was already wandering outside since d/c home this past week

## 2012-07-25 NOTE — Progress Notes (Signed)
  Subjective:    Patient ID: Alejandra Cruz, female    DOB: [birth year], 78 y.o.   MRN: [id number]  HPI Pt in today for follow up. She unfortunately fractured her hip in June, but fortunately has recieved well following surgical repair, and skilled nursing facility f/u care for approx 3 month Has been home for 1 week. Already daughter has found her out in the yard, not deemed safe for her, and is more than ever convinced of the need for her Mom to be in a supervised living situation, and will look further into this. The conversation had already been started with both the pt and other siblings. Pt gets depressed appearing and teary when this is discussed, but is understanding of the situation   Review of Systems See HPI Denies recent fever or chills. Denies sinus pressure, nasal congestion, ear pain or sore throat. Denies chest congestion, productive cough or wheezing. Denies chest pains, palpitations and leg swelling Denies abdominal pain, nausea, vomiting,diarrhea or constipation.   Denies dysuria, frequency, hesitancy or incontinence. C/o  limitation in mobility.ambulates with a walker Denies headaches, seizures, numbness, or tingling. Denies uncontrolled depression, anxiety or insomnia. Denies skin break down or rash.        Objective:   Physical Exam Patient alert and oriented and in no cardiopulmonary distress.  HEENT: No facial asymmetry, EOMI, no sinus tenderness,  oropharynx pink and moist.  Neck supple no adenopathy.  Chest: Clear to auscultation bilaterally.  CVS: S1, S2 no murmurs, no S3.  ABD: Soft non tender. Bowel sounds normal.  Ext: No edema  MS: decreased  ROM spine, shoulders, hips and knees.  Skin: Intact, no ulcerations or rash noted.  Psych: Good eye contact, normal affect. Memory impaired not anxious mildly depressed appearing.  CNS: CN 2-12 intact, power, tone and sensation normal throughout.        Assessment & Plan:

## 2012-07-25 NOTE — Assessment & Plan Note (Signed)
Controlled, no change in medication DASH diet encouraged, as a part of hypertension management. The importance of attaining a healthy weight is also discussed.

## 2012-07-25 NOTE — Assessment & Plan Note (Signed)
Updated lab today, though pt had ensure his am. Hyperlipidemia:Low fat diet discussed and encouraged.

## 2012-07-25 NOTE — Assessment & Plan Note (Signed)
Ambulating with a  Walker home following 3 month SNF stay

## 2012-07-25 NOTE — Assessment & Plan Note (Signed)
Controlled, no change in medication  

## 2012-07-25 NOTE — Patient Instructions (Addendum)
Annual wellness with accompanying family member in end December or early January  Please call if you need me before  You need the shingles vaccine, check at your pharmacy and plan to get this in the near future if possible  Flu and pneumonia vaccines today   Lipid, cmp, cbc, iron anmd ferritin levels today  Please make every effort to keep safe in your home, and I really do recommend living in a long term facility, where your safety , is more assured. Family will still be as involved with you , love and care for you

## 2012-07-26 LAB — COMPREHENSIVE METABOLIC PANEL
ALT: 12 U/L (ref 0–35)
AST: 18 U/L (ref 0–37)
Calcium: 8.9 mg/dL (ref 8.4–10.5)
Chloride: 105 mEq/L (ref 96–112)
Creat: 0.71 mg/dL (ref 0.50–1.10)
Sodium: 143 mEq/L (ref 135–145)

## 2012-07-26 LAB — LIPID PANEL
LDL Cholesterol: 87 mg/dL (ref 0–99)
Triglycerides: 40 mg/dL (ref <150)
VLDL: 8 mg/dL (ref 0–40)

## 2012-07-26 LAB — IRON: Iron: 49 ug/dL (ref 42–145)

## 2012-08-01 ENCOUNTER — Telehealth: Payer: Self-pay | Admitting: Family Medicine

## 2012-08-01 ENCOUNTER — Encounter: Payer: Self-pay | Admitting: Family Medicine

## 2012-08-01 MED ORDER — CEPHALEXIN 500 MG PO CAPS: 500.0000 mg | ORAL_CAPSULE | Freq: Two times a day (BID) | ORAL | Status: AC

## 2012-08-01 NOTE — Telephone Encounter (Signed)
Order written for shoe

## 2012-08-01 NOTE — Telephone Encounter (Signed)
Car[facility] at [facility] called and states that the patient has had a wound on her right foot and wanted to know what you (Dr Lodema Hong) were going to do about it since she just came in for OV. I looked in the note and this wasn't addressed in the chart. PT has already called and got an order for a post op shoe so they could ambulate her since she couldn't wear any of her shoes due to the dressings. The [facility] nurse is going to continue to use silver on it until she hears back from [place]

## 2012-08-01 NOTE — Telephone Encounter (Signed)
Called and left message with [facility] to call me back in reference to pt wound, I see no recent documentation and PCP is not available

## 2012-08-01 NOTE — Telephone Encounter (Addendum)
Spoke with [facility] Carrie 500 [phone]. Patient had a sacral wound while she was in the nursing home and is now pinpoint size and she's just placing hydrocolloid dressing on it. She has stage II pressure ulcer on her right heel however it does have foul odor with minimal drainage. She denies any surrounding cellulitis of the foot. Her pain control is good. She is doing silver gel and calcium alginate dressings on it. I will add cephalexin and she will need to come in to have her foot evaluated

## 2012-08-01 NOTE — Telephone Encounter (Signed)
Enrique Sack from PT states they are having a hard time ambulating patient because her apt is small so they have to go outside and she has no shoe to fit over her right foot dressings. Wants order for post op shoe sent to [facility]

## 2012-08-04 ENCOUNTER — Telehealth: Payer: Self-pay | Admitting: Family Medicine

## 2012-08-04 ENCOUNTER — Other Ambulatory Visit: Payer: Self-pay | Admitting: Family Medicine

## 2012-08-04 ENCOUNTER — Ambulatory Visit: Payer: Medicare Other | Admitting: Family Medicine

## 2012-08-04 DIAGNOSIS — L97409 Non-pressure chronic ulcer of unspecified heel and midfoot with unspecified severity: Secondary | ICD-10-CM

## 2012-08-04 DIAGNOSIS — L98429 Non-pressure chronic ulcer of back with unspecified severity: Secondary | ICD-10-CM

## 2012-08-04 NOTE — Telephone Encounter (Signed)
Daughter is aware of the apointment for 10.30.13 @ 9:15 wound center in [place]

## 2012-08-04 NOTE — Telephone Encounter (Signed)
I spoke with pt's daughter Philomena Doheny. Please make appt for her mother to go to the wound center as soon as possible some time this week, re the heel wound and sacral ulcer, referral is entered, and daughter can be contacted at [phone]. I have decided against seeing her for what sounds like a significant wound, unable to get in today Please let me know if wound center cannot see her this week, i will s[pk with Dr Tanda Rockers

## 2012-08-04 NOTE — Telephone Encounter (Signed)
Please add pt to my schedule this pm if someone can bring her, if not today then another day this week pls re foot ulcer

## 2012-08-05 ENCOUNTER — Telehealth: Payer: Self-pay | Admitting: Family Medicine

## 2012-08-06 NOTE — Telephone Encounter (Signed)
Pt was referred to wound center. appt was made and pt is aware

## 2012-08-06 NOTE — Telephone Encounter (Signed)
Noted  

## 2012-08-11 ENCOUNTER — Other Ambulatory Visit: Payer: Self-pay | Admitting: Family Medicine

## 2012-08-11 ENCOUNTER — Telehealth: Payer: Self-pay | Admitting: Family Medicine

## 2012-08-11 NOTE — Telephone Encounter (Signed)
meds refilled that were sent over e-rx.

## 2012-08-15 ENCOUNTER — Other Ambulatory Visit: Payer: Self-pay | Admitting: Family Medicine

## 2012-08-18 ENCOUNTER — Telehealth: Payer: Self-pay | Admitting: Family Medicine

## 2012-08-18 ENCOUNTER — Other Ambulatory Visit: Payer: Self-pay | Admitting: Family Medicine

## 2012-08-18 ENCOUNTER — Other Ambulatory Visit: Payer: Self-pay

## 2012-08-18 MED ORDER — LOVASTATIN 40 MG PO TABS: 40.0000 mg | ORAL_TABLET | Freq: Every day | ORAL | Status: DC

## 2012-08-18 MED ORDER — CLONIDINE HCL 0.1 MG PO TABS: 0.1000 mg | ORAL_TABLET | Freq: Every day | ORAL | Status: DC

## 2012-08-18 NOTE — Telephone Encounter (Signed)
meds ordered

## 2012-08-25 ENCOUNTER — Telehealth: Payer: Self-pay | Admitting: Family Medicine

## 2012-08-25 NOTE — Telephone Encounter (Signed)
Patient stated that Dr. Romeo Apple instructed her to take medicine for 5 months.  She states that she started the medicine in July.  Just wanted to clarify that she should take it for that long.  I told her to continue the medicine as prescribed but if she has any questions to contact Dr. Romeo Apple.

## 2012-09-01 ENCOUNTER — Telehealth: Payer: Self-pay

## 2012-09-01 NOTE — Telephone Encounter (Signed)
OT called in to let us know pt fell fri but was not injured. Also pt reports being depressed and crying episodes when she's alone. Already on depression med but may need to be changed. Gave verbal order for social worker consult for them to evaluate and explore her options (family wants to put her in facility)

## 2012-09-08 ENCOUNTER — Other Ambulatory Visit: Payer: Self-pay

## 2012-09-08 MED ORDER — FLUOXETINE HCL 20 MG PO CAPS: 20.0000 mg | ORAL_CAPSULE | Freq: Every day | ORAL | Status: DC

## 2012-09-08 NOTE — Telephone Encounter (Signed)
Patient aware and med sent  

## 2012-09-08 NOTE — Telephone Encounter (Signed)
Please call pt and let her know I am aware that she has been feeling sad, remind her of her Dec 27 appt. Please let her know I would like to increase the fluoxetine from 10mg  to 20mg  daily till she comes in, if she agrees please enter and send in the change in dose to her pharmacyalso let them know Please advise her to STOP the 10mg  tabs, may be too confusing to explain on the phone

## 2012-09-16 ENCOUNTER — Telehealth: Payer: Self-pay | Admitting: Family Medicine

## 2012-09-16 NOTE — Telephone Encounter (Signed)
Called number back no answer

## 2012-09-17 NOTE — Telephone Encounter (Signed)
Spoke with pt and order extended.

## 2012-10-03 ENCOUNTER — Ambulatory Visit (INDEPENDENT_AMBULATORY_CARE_PROVIDER_SITE_OTHER): Payer: Medicare Other | Admitting: Family Medicine

## 2012-10-03 ENCOUNTER — Encounter: Payer: Self-pay | Admitting: Family Medicine

## 2012-10-03 VITALS — BP 120/70 | HR 75 | Resp 16 | Ht 67.0 in | Wt 187.0 lb

## 2012-10-03 DIAGNOSIS — F3289 Other specified depressive episodes: Secondary | ICD-10-CM

## 2012-10-03 DIAGNOSIS — I10 Essential (primary) hypertension: Secondary | ICD-10-CM

## 2012-10-03 DIAGNOSIS — Z86010 Personal history of colonic polyps: Secondary | ICD-10-CM

## 2012-10-03 DIAGNOSIS — R569 Unspecified convulsions: Secondary | ICD-10-CM

## 2012-10-03 DIAGNOSIS — E785 Hyperlipidemia, unspecified: Secondary | ICD-10-CM

## 2012-10-03 DIAGNOSIS — I82439 Acute embolism and thrombosis of unspecified popliteal vein: Secondary | ICD-10-CM

## 2012-10-03 DIAGNOSIS — K219 Gastro-esophageal reflux disease without esophagitis: Secondary | ICD-10-CM

## 2012-10-03 DIAGNOSIS — S0990XA Unspecified injury of head, initial encounter: Secondary | ICD-10-CM | POA: Insufficient documentation

## 2012-10-03 DIAGNOSIS — I824Y9 Acute embolism and thrombosis of unspecified deep veins of unspecified proximal lower extremity: Secondary | ICD-10-CM

## 2012-10-03 DIAGNOSIS — F329 Major depressive disorder, single episode, unspecified: Secondary | ICD-10-CM

## 2012-10-03 NOTE — Patient Instructions (Addendum)
F/u in 6 to 8 weeks. Please call if you need me before.  Stop xaralto, omeprazole, clonidine and omeprazole. Start baby aspirin 81mg  one daily Finish the iron tablets that you have , take one daily.  After you have finished the iron tablets , take one multivitamin one daily  You need to go to bed at  a set time , turn off all light and sound and lie in bed until you fall asleep  You are at high risk for falls having fallen several times in the past,and your gait is not steady. It is very  Important for you to limit walking to daytime, use sufficient lighting if you have to get up at night, and ensure that your home environment is safe, not much clutter, and no scatter rugs.  You did extremely well on memory evaluation  Please have your son sign so that if he calls with questions about your health we can speak with him.  You are encouraged to determine a health care power of attorney.  You will get information as to how to access labs and radiology reports  On the internet and you are encouraged to do so, my chart.  Wrap on the leg was too tight needs to be loose, just for protection  You are referred for a head scan

## 2012-10-03 NOTE — Progress Notes (Signed)
  Subjective:    Patient ID: Alejandra Cruz, female    DOB: 04/22/1933, 78 y.o.   MRN: [id number]  HPI Pt in with her son for follow up. Decision taken , based on specific requests from her son, not to pursue formally the annual wellness visit, but instead expressed concerns were addressed. Review of current med done. Several discontinued due to lack of perceived need. Question as to best living situation are a major issues, question asked re possible concussion injury due to repeated fall history.requests head CT scan states he has a photo of a cut in mid forehead sustained in the summer months. Concern voiced over ulcer pt obtained during nursing facility stay in the Summer, now comes with obviously tightly wrapped right foot where she has healing ulcer on heel. Has question as to possible dementia, pt has mini mental status at visit and scores very well. She does have OCD disorder however, and is known to have behavioral issues in terms of wandering issues outside of the house and also despite repeated warning to take things easy , will get up and move around at risk of falls. This has been an old issue and will not lessen in the near forseeable future. I use this fact to state my view that a controlled living environment is likely best for his Mother, he is still in contemplative mood about this. End of life issues  were briefly touched on, current health care power of attorney is his sister living in the area, mentioned ressuccitative efforts desired in the event of an arrest , however this was not expanded on at visit Review of Systems See HPI Denies recent fever or chills. Denies sinus pressure, nasal congestion, ear pain or sore throat. Denies chest congestion, productive cough or wheezing. Denies chest pains, palpitations and leg swelling Denies abdominal pain, nausea, vomiting,diarrhea or constipation.   Denies dysuria, frequency, hesitancy or incontinence. Unsteady gait with high fall  risk Denies headaches, seizures, numbness, or tingling. Denies depression, anxiety or insomnia.       Objective:   Physical Exam Patient alert and oriented and in no cardiopulmonary distress.  HEENT: No facial asymmetry, EOMI, no sinus tenderness,  oropharynx pink and moist.  Neck decre ased though adeqaute ROM no adenopathy.  Chest: Clear to auscultation bilaterally.  CVS: S1, S2 no murmurs, no S3.  ABD: Soft non tender. Bowel sounds normal.  Ext:  Edema of right leg where bandage has been applied too tightly  WU:[id number] ROM spine, shoulders, hips and knees.  Skin: nickel sized ulcer on left heel, no erythema or obvious drainage, examined by nurse, and re dressed  Psych: Good eye contact, normal affect. Memory intact not anxious or depressed appearing.  CNS: CN 2-12 intact, power,  normal throughout.       Assessment & Plan:

## 2012-10-05 DIAGNOSIS — I82439 Acute embolism and thrombosis of unspecified popliteal vein: Secondary | ICD-10-CM | POA: Insufficient documentation

## 2012-10-05 NOTE — Assessment & Plan Note (Signed)
H/o repeated falls, with known head trauma, scan of brain to assess for subdural bleed or intracranial pathology. Dementia screen showed only mild memory loss, with negative mini cog test

## 2012-10-05 NOTE — Assessment & Plan Note (Signed)
Over corrected, clonidine discontinued

## 2012-10-05 NOTE — Assessment & Plan Note (Signed)
D/c xaralto effective 10/03/2012

## 2012-10-05 NOTE — Assessment & Plan Note (Signed)
Controlled, no change in medication Hyperlipidemia:Low fat diet discussed and encouraged.  \ 

## 2012-10-05 NOTE — Assessment & Plan Note (Signed)
Denies symptoms, will discontinue omeprazole and see if becomes symptomatic

## 2012-10-05 NOTE — Assessment & Plan Note (Signed)
Controlled, no change in medication  

## 2012-10-05 NOTE — Assessment & Plan Note (Signed)
Followed by GI, Dr Fields 

## 2012-10-05 NOTE — Assessment & Plan Note (Signed)
Controlled, no change in medication Followed by neurology 

## 2012-10-13 ENCOUNTER — Telehealth: Payer: Self-pay

## 2012-10-13 ENCOUNTER — Other Ambulatory Visit: Payer: Self-pay | Admitting: Family Medicine

## 2012-10-13 MED ORDER — POLYETHYLENE GLYCOL 3350 17 GM/SCOOP PO POWD: ORAL | Status: DC

## 2012-10-13 NOTE — Telephone Encounter (Signed)
Wants to know if she can get a rx for miralax sent in. She used to get it from another Dr and she doesn't see them anymore. Wants to know if you can refill it for her to [place]

## 2012-10-13 NOTE — Telephone Encounter (Signed)
Med is sent in 

## 2012-10-13 NOTE — Telephone Encounter (Signed)
Spoke with patient and she states without the medicine she has a hard time moving her bowels.  States that she does not take daily but every other day.

## 2012-10-13 NOTE — Telephone Encounter (Signed)
pls verify and document that she c/o constipation or irregular bowel movements without the medication. IF she does have this complaint, let me know so I can send in. At last visit, a lot of time was spent simplifying and reducing med burden, so I need to be sure she needs this pls

## 2012-10-14 ENCOUNTER — Ambulatory Visit (HOSPITAL_COMMUNITY): Payer: Medicare Other

## 2012-10-14 DIAGNOSIS — I10 Essential (primary) hypertension: Secondary | ICD-10-CM

## 2012-10-14 DIAGNOSIS — L89609 Pressure ulcer of unspecified heel, unspecified stage: Secondary | ICD-10-CM

## 2012-10-14 DIAGNOSIS — S7290XD Unspecified fracture of unspecified femur, subsequent encounter for closed fracture with routine healing: Secondary | ICD-10-CM

## 2012-10-14 DIAGNOSIS — L8993 Pressure ulcer of unspecified site, stage 3: Secondary | ICD-10-CM

## 2012-10-14 DIAGNOSIS — R9389 Abnormal findings on diagnostic imaging of other specified body structures: Secondary | ICD-10-CM

## 2012-10-29 ENCOUNTER — Telehealth: Payer: Self-pay | Admitting: Family Medicine

## 2012-10-29 NOTE — Telephone Encounter (Signed)
Pt aware she is to take 1 of the 81 mg daily

## 2012-11-03 ENCOUNTER — Telehealth: Payer: Self-pay | Admitting: Family Medicine

## 2012-11-03 MED ORDER — DILTIAZEM HCL ER COATED BEADS 120 MG PO CP24: ORAL_CAPSULE | ORAL | Status: DC

## 2012-11-03 NOTE — Telephone Encounter (Signed)
Med refilled.

## 2012-11-25 ENCOUNTER — Other Ambulatory Visit: Payer: Self-pay | Admitting: Family Medicine

## 2012-11-26 ENCOUNTER — Encounter: Payer: Self-pay | Admitting: Family Medicine

## 2012-11-26 ENCOUNTER — Ambulatory Visit (INDEPENDENT_AMBULATORY_CARE_PROVIDER_SITE_OTHER): Payer: Medicare Other | Admitting: Family Medicine

## 2012-11-26 ENCOUNTER — Other Ambulatory Visit: Payer: Self-pay

## 2012-11-26 VITALS — BP 130/76 | HR 86 | Resp 18 | Ht 67.0 in | Wt 181.0 lb

## 2012-11-26 DIAGNOSIS — F3289 Other specified depressive episodes: Secondary | ICD-10-CM

## 2012-11-26 DIAGNOSIS — I10 Essential (primary) hypertension: Secondary | ICD-10-CM

## 2012-11-26 DIAGNOSIS — R569 Unspecified convulsions: Secondary | ICD-10-CM

## 2012-11-26 DIAGNOSIS — E785 Hyperlipidemia, unspecified: Secondary | ICD-10-CM

## 2012-11-26 DIAGNOSIS — B351 Tinea unguium: Secondary | ICD-10-CM | POA: Insufficient documentation

## 2012-11-26 DIAGNOSIS — F329 Major depressive disorder, single episode, unspecified: Secondary | ICD-10-CM

## 2012-11-26 MED ORDER — LOVASTATIN 40 MG PO TABS: ORAL_TABLET | ORAL | Status: DC

## 2012-11-26 MED ORDER — TERBINAFINE HCL 250 MG PO TABS: 250.0000 mg | ORAL_TABLET | Freq: Every day | ORAL | Status: DC

## 2012-11-26 NOTE — Patient Instructions (Addendum)
F/u in 3.5 month, please call if you need me before  New medication for fungal toenail infection.  Keep skin dry and clean.  Happy you are doing well.  Fasting lipid, cmp end April

## 2012-11-30 NOTE — Assessment & Plan Note (Signed)
Stable an controlled. Hyperlipidemia:Low fat diet discussed and encouraged.

## 2012-11-30 NOTE — Assessment & Plan Note (Signed)
Controlled, no change in medication  

## 2012-11-30 NOTE — Progress Notes (Signed)
  Subjective:    Patient ID: Alejandra Cruz, female    DOB: [birth year], 79 y.o.   MRN: [id number]  HPI The PT is here for follow up and re-evaluation of chronic medical conditions, medication management and review of any available recent lab and radiology data.  Preventive health is updated, specifically  Cancer screening and Immunization.   Questions or concerns regarding consultations or procedures which the PT has had in the interim are  Addressed.Contnues to be followed by th wound center for ulcer on heel which is improving staeadily The PT denies any adverse reactions to current medications since the last visit.  There are no new concerns. Daughter states that at times, her Mom still goes outside unattended which is an issue There are no specific complaints       Review of Systems See HPI Denies recent fever or chills. Denies sinus pressure, nasal congestion, ear pain or sore throat. Denies chest congestion, productive cough or wheezing. Denies chest pains, palpitations and leg swelling Denies abdominal pain, nausea, vomiting,diarrhea or constipation.   Denies dysuria, frequency, hesitancy or incontinence. Chronic  joint pain, and limitation in mobility. Denies headaches, seizures, numbness, or tingling. Denies uncontrolled  depression, anxiety or insomnia.         Objective:   Physical Exam  Patient alert and oriented and in no cardiopulmonary distress.  HEENT: No facial asymmetry, EOMI, no sinus tenderness,  oropharynx pink and moist.  Neck adequate ROM no adenopathy.  Chest: Clear to auscultation bilaterally.  CVS: S1, S2 no murmurs, no S3.  ABD: Soft non tender. Bowel sounds normal.  Ext: No edema  MS: decreased  ROM spine, shoulders, hips and knees.  Skin:severe onychomycosis and tine pedis. Healing hee ulcer, diameter appox 1.5 cm Psych: Good eye contact, blunted  affect. Memory intact not anxious or depressed appearing.  CNS: CN 2-12 intact, power, tone  and sensation normal throughout.       Assessment & Plan:

## 2012-11-30 NOTE — Assessment & Plan Note (Signed)
Controlled, no change in medication DASH diet and commitment to daily physical activity for a minimum of 30 minutes discussed and encouraged, as a part of hypertension management. The importance of attaining a healthy weight is also discussed.  

## 2012-11-30 NOTE — Assessment & Plan Note (Signed)
Oral med for 4 month

## 2012-11-30 NOTE — Assessment & Plan Note (Signed)
Controlled , treated by neurology 

## 2012-12-04 ENCOUNTER — Other Ambulatory Visit: Payer: Self-pay

## 2012-12-04 MED ORDER — FLUOXETINE HCL 20 MG PO CAPS: 20.0000 mg | ORAL_CAPSULE | Freq: Every day | ORAL | Status: DC

## 2012-12-11 ENCOUNTER — Telehealth: Payer: Self-pay | Admitting: Family Medicine

## 2012-12-11 ENCOUNTER — Other Ambulatory Visit: Payer: Self-pay

## 2012-12-11 MED ORDER — FLUOXETINE HCL 20 MG PO CAPS: 20.0000 mg | ORAL_CAPSULE | Freq: Every day | ORAL | Status: DC

## 2012-12-11 NOTE — Telephone Encounter (Signed)
Med resent 

## 2012-12-17 ENCOUNTER — Telehealth: Payer: Self-pay | Admitting: Family Medicine

## 2012-12-17 MED ORDER — LOVASTATIN 40 MG PO TABS: ORAL_TABLET | ORAL | Status: DC

## 2012-12-17 NOTE — Telephone Encounter (Signed)
Refill sent in

## 2012-12-24 ENCOUNTER — Telehealth: Payer: Self-pay | Admitting: Family Medicine

## 2012-12-25 NOTE — Telephone Encounter (Signed)
noted 

## 2013-01-07 ENCOUNTER — Telehealth: Payer: Self-pay | Admitting: Family Medicine

## 2013-01-07 MED ORDER — DILTIAZEM HCL ER COATED BEADS 120 MG PO CP24: ORAL_CAPSULE | ORAL | Status: DC

## 2013-01-07 NOTE — Telephone Encounter (Signed)
Med sent.

## 2013-01-20 ENCOUNTER — Other Ambulatory Visit: Payer: Self-pay | Admitting: Family Medicine

## 2013-01-20 ENCOUNTER — Telehealth: Payer: Self-pay | Admitting: Family Medicine

## 2013-01-20 MED ORDER — LEVETIRACETAM 500 MG PO TABS: ORAL_TABLET | ORAL | Status: DC

## 2013-01-20 NOTE — Telephone Encounter (Signed)
Med refilled.

## 2013-02-09 ENCOUNTER — Telehealth: Payer: Self-pay | Admitting: Family Medicine

## 2013-02-09 NOTE — Telephone Encounter (Signed)
Patient aware.

## 2013-02-16 LAB — COMPREHENSIVE METABOLIC PANEL
ALT: 12 U/L (ref 0–35)
AST: 18 U/L (ref 0–37)
Albumin: 4 g/dL (ref 3.5–5.2)
Alkaline Phosphatase: 104 U/L (ref 39–117)
Potassium: 4.6 mEq/L (ref 3.5–5.3)
Sodium: 140 mEq/L (ref 135–145)
Total Bilirubin: 0.5 mg/dL (ref 0.3–1.2)
Total Protein: 7.2 g/dL (ref 6.0–8.3)

## 2013-02-16 LAB — LIPID PANEL
HDL: 58 mg/dL (ref >39)
LDL Cholesterol: 70 mg/dL (ref 0–99)
Total CHOL/HDL Ratio: 2.3 Ratio

## 2013-03-11 ENCOUNTER — Ambulatory Visit (INDEPENDENT_AMBULATORY_CARE_PROVIDER_SITE_OTHER): Payer: Medicare Other | Admitting: Family Medicine

## 2013-03-11 ENCOUNTER — Other Ambulatory Visit: Payer: Self-pay | Admitting: Family Medicine

## 2013-03-11 ENCOUNTER — Other Ambulatory Visit: Payer: Self-pay

## 2013-03-11 ENCOUNTER — Encounter: Payer: Self-pay | Admitting: Family Medicine

## 2013-03-11 VITALS — BP 122/78 | HR 69 | Resp 16 | Ht 67.0 in | Wt 178.1 lb

## 2013-03-11 DIAGNOSIS — I10 Essential (primary) hypertension: Secondary | ICD-10-CM

## 2013-03-11 DIAGNOSIS — Z9181 History of falling: Secondary | ICD-10-CM

## 2013-03-11 DIAGNOSIS — R296 Repeated falls: Secondary | ICD-10-CM

## 2013-03-11 DIAGNOSIS — K5900 Constipation, unspecified: Secondary | ICD-10-CM

## 2013-03-11 DIAGNOSIS — F329 Major depressive disorder, single episode, unspecified: Secondary | ICD-10-CM

## 2013-03-11 DIAGNOSIS — E785 Hyperlipidemia, unspecified: Secondary | ICD-10-CM

## 2013-03-11 DIAGNOSIS — B351 Tinea unguium: Secondary | ICD-10-CM

## 2013-03-11 DIAGNOSIS — R569 Unspecified convulsions: Secondary | ICD-10-CM

## 2013-03-11 DIAGNOSIS — R0989 Other specified symptoms and signs involving the circulatory and respiratory systems: Secondary | ICD-10-CM

## 2013-03-11 DIAGNOSIS — F3289 Other specified depressive episodes: Secondary | ICD-10-CM

## 2013-03-11 MED ORDER — FLUOXETINE HCL 20 MG PO CAPS: 20.0000 mg | ORAL_CAPSULE | Freq: Every day | ORAL | Status: DC

## 2013-03-11 MED ORDER — LEVETIRACETAM 500 MG PO TABS: ORAL_TABLET | ORAL | Status: DC

## 2013-03-11 NOTE — Patient Instructions (Addendum)
F/u in October, call if you need me before.  Labs and blood pressure are excellent   You need to be safe from falling  Please look into senior citizens group

## 2013-03-11 NOTE — Progress Notes (Signed)
  Subjective:    Patient ID: Alejandra Cruz, female    DOB: 08-Jun-1933, 79 y.o.   MRN: [id number]  HPI The PT is here for follow up and re-evaluation of chronic medical conditions, medication management and review of any available recent lab and radiology data.  Preventive health is updated, specifically Immunization.   The PT denies any adverse reactions to current medications since the last visit.  Daughter states she is just learning that her Mom recently fell while trying to pick up something on the floor, bruising of the right knee is the only injury noted    Review of Systems    See HPI Denies recent fever or chills. Denies sinus pressure, nasal congestion, ear pain or sore throat. Denies chest congestion, productive cough or wheezing. Denies chest pains, palpitations and leg swelling Denies abdominal pain, nausea, vomiting,diarrhea or constipation.   Denies dysuria, frequency, hesitancy or incontinence. Chronic  joint pain,  and limitation in mobility. Denies headaches, seizures, numbness, or tingling. Denies uncontrolled  depression, anxiety or insomnia.Needs to return to seniors gr[facility] Denies skin break down or rash.     Objective:   Physical Exam  Patient alert and oriented and in no cardiopulmonary distress.  HEENT: No facial asymmetry, EOMI, no sinus tenderness,  oropharynx pink and moist.  Neck decreased ROM, no adenopathy.  Chest: Clear to auscultation bilaterally.  CVS: S1, S2 no murmurs, no S3.  ABD: Soft non tender. Bowel sounds normal.  Ext: No edema  MS: decreased  ROM spine, shoulders, hips and knees.  Skin: Intact, no ulcerations or rash noted.  Psych: Good eye contact, normal affect. Memory mildly impaired  not anxious or depressed appearing.  CNS: CN 2-12 intact, power,  normal throughout.       Assessment & Plan:

## 2013-03-15 DIAGNOSIS — R296 Repeated falls: Secondary | ICD-10-CM | POA: Insufficient documentation

## 2013-03-15 NOTE — Assessment & Plan Note (Signed)
Controlled, no change in medication High fiber iet rich in fruit and vegetable encouraged

## 2013-03-15 NOTE — Assessment & Plan Note (Signed)
Pt to complete 4 month course of oral med

## 2013-03-15 NOTE — Assessment & Plan Note (Signed)
Stable on kepra, no recent seizure history, followed by neurology

## 2013-03-15 NOTE — Assessment & Plan Note (Signed)
Controlled, no change in medication  

## 2013-03-15 NOTE — Assessment & Plan Note (Addendum)
Continued fall despite multiple warnings, while bending to pick up object. Safety again discussed with pt and her daughter, pt states she will stop trying to pick up objects from the floor as directed

## 2013-05-15 ENCOUNTER — Other Ambulatory Visit: Payer: Self-pay | Admitting: Family Medicine

## 2013-05-18 ENCOUNTER — Telehealth: Payer: Self-pay | Admitting: Family Medicine

## 2013-05-18 NOTE — Telephone Encounter (Signed)
meds refilled 8/8.  Patient should check with pharmacy.

## 2013-06-15 ENCOUNTER — Other Ambulatory Visit: Payer: Self-pay

## 2013-06-15 MED ORDER — LOVASTATIN 40 MG PO TABS: ORAL_TABLET | ORAL | Status: DC

## 2013-06-15 MED ORDER — FLUOXETINE HCL 20 MG PO CAPS: 20.0000 mg | ORAL_CAPSULE | Freq: Every day | ORAL | Status: DC

## 2013-06-23 ENCOUNTER — Other Ambulatory Visit: Payer: Self-pay

## 2013-06-23 ENCOUNTER — Telehealth: Payer: Self-pay | Admitting: Family Medicine

## 2013-06-23 MED ORDER — POLYETHYLENE GLYCOL 3350 17 GM/SCOOP PO POWD: ORAL | Status: DC

## 2013-06-23 NOTE — Telephone Encounter (Signed)
miralax refilled with note for pharmacy to deliver

## 2013-07-21 ENCOUNTER — Telehealth: Payer: Self-pay | Admitting: Family Medicine

## 2013-07-22 NOTE — Telephone Encounter (Signed)
Called and spoke with daughter who voiced that there were no concerns that she knew of.

## 2013-07-29 ENCOUNTER — Ambulatory Visit: Payer: BC Managed Care – PPO | Admitting: Family Medicine

## 2013-07-30 ENCOUNTER — Ambulatory Visit (INDEPENDENT_AMBULATORY_CARE_PROVIDER_SITE_OTHER): Payer: Medicare Other | Admitting: Family Medicine

## 2013-07-30 ENCOUNTER — Encounter: Payer: Self-pay | Admitting: Family Medicine

## 2013-07-30 ENCOUNTER — Encounter (INDEPENDENT_AMBULATORY_CARE_PROVIDER_SITE_OTHER): Payer: Self-pay

## 2013-07-30 ENCOUNTER — Ambulatory Visit: Payer: BC Managed Care – PPO | Admitting: Family Medicine

## 2013-07-30 VITALS — BP 120/80 | HR 82 | Resp 16 | Ht 67.0 in | Wt 174.4 lb

## 2013-07-30 DIAGNOSIS — F429 Obsessive-compulsive disorder, unspecified: Secondary | ICD-10-CM

## 2013-07-30 DIAGNOSIS — M25519 Pain in unspecified shoulder: Secondary | ICD-10-CM

## 2013-07-30 DIAGNOSIS — Z13820 Encounter for screening for osteoporosis: Secondary | ICD-10-CM

## 2013-07-30 DIAGNOSIS — F329 Major depressive disorder, single episode, unspecified: Secondary | ICD-10-CM

## 2013-07-30 DIAGNOSIS — E785 Hyperlipidemia, unspecified: Secondary | ICD-10-CM

## 2013-07-30 DIAGNOSIS — I10 Essential (primary) hypertension: Secondary | ICD-10-CM

## 2013-07-30 DIAGNOSIS — R5381 Other malaise: Secondary | ICD-10-CM

## 2013-07-30 DIAGNOSIS — F3289 Other specified depressive episodes: Secondary | ICD-10-CM

## 2013-07-30 DIAGNOSIS — Z23 Encounter for immunization: Secondary | ICD-10-CM

## 2013-07-30 MED ORDER — LEVETIRACETAM 500 MG PO TABS: ORAL_TABLET | ORAL | Status: DC

## 2013-07-30 MED ORDER — PREDNISONE 5 MG PO TABS: 5.0000 mg | ORAL_TABLET | Freq: Two times a day (BID) | ORAL | Status: AC

## 2013-07-30 MED ORDER — METHYLPREDNISOLONE ACETATE 80 MG/ML IJ SUSP
80.0000 mg | Freq: Once | INTRAMUSCULAR | Status: AC
  Administered 2013-07-30: 80 mg via INTRAMUSCULAR

## 2013-07-30 MED ORDER — DILTIAZEM HCL ER COATED BEADS 120 MG PO CP24: ORAL_CAPSULE | ORAL | Status: DC

## 2013-07-30 MED ORDER — KETOROLAC TROMETHAMINE 60 MG/2ML IM SOLN
60.0000 mg | Freq: Once | INTRAMUSCULAR | Status: AC
  Administered 2013-07-30: 60 mg via INTRAMUSCULAR

## 2013-07-30 NOTE — Patient Instructions (Addendum)
F/U with rectal in 4.5 month, call if you need me before  Flu vaccine today  Fasting lipid, cmp, CBC, vit D, CBC TSH as soon as possible   You are referred for dexa   Pls sched your mammogram, past due  Toradol 60mg  , and depo medrol 80 mg IM today for shoulder pain and prednisone sent in for 5 days  Daughter Philomena Doheny [phone] cell will call back re  Shoulder if pain and debility persist  Microwave when no one there and cook when people are present  Look into senior citizens activities , this will be god for you

## 2013-07-30 NOTE — Progress Notes (Signed)
  Subjective:    Patient ID: Alejandra Cruz, female    DOB: 03-17-1933, 79 y.o.   MRN: [id number]  HPI The PT is here for follow up and re-evaluation of chronic medical conditions, medication management and review of any available recent lab and radiology data.  Preventive health is updated, specifically  Cancer screening and Immunization.   The PT denies any adverse reactions to current medications since the last visit.  1 week h/o increased bilateral shoulder pain with limitation in mobility, left greater than , no recent trauma to the shoulders Still not going out during the daytime to group activities like the senior citizens group. No falls since her last visit. Needs dexa      Review of Systems See HPI Denies recent fever or chills. Denies sinus pressure, nasal congestion, ear pain or sore throat. Denies chest congestion, productive cough or wheezing. Denies chest pains, palpitations and leg swelling Denies abdominal pain, nausea, vomiting,diarrhea or constipation.   Denies dysuria, frequency, hesitancy or incontinence.  Denies headaches, seizures, numbness, or tingling. Denies uncontrolled  depression, anxiety or insomnia. Denies skin break down or rash.        Objective:   Physical Exam  Patient alert and oriented and in no cardiopulmonary distress.  HEENT: No facial asymmetry, EOMI, no sinus tenderness,  oropharynx pink and moist.  Neck decreased ROM , bilateral trapezius spasm no adenopathy.  Chest: Clear to auscultation bilaterally.  CVS: S1, S2 no murmurs, no S3.  ABD: Soft non tender. Bowel sounds normal.  Ext: No edema  FI:EPPIRJJOA ROM spine, shoulders, hips and knees.  Skin: Intact, no ulcerations or rash noted.  Psych: Good eye contact, blunted  affect. Memory intact not anxious or depressed appearing.  CNS: CN 2-12 intact, power, tone and sensation normal throughout.       Assessment & Plan:

## 2013-08-13 ENCOUNTER — Telehealth: Payer: Self-pay | Admitting: Family Medicine

## 2013-08-13 NOTE — Telephone Encounter (Signed)
No answer agian

## 2013-08-13 NOTE — Telephone Encounter (Signed)
Spoke with patient and she stated the bone denisty test that is scheduled for 11.7.2014 at 3:30 she needs to have it rescheduled so I did for 11.20.2014 at 3:30 spoke with her daughter and she stated that she did not know if her mother would be doing that and she would schedule this on her time. I have tried calling patient back several times and it has been busy or party not answering

## 2013-08-14 ENCOUNTER — Other Ambulatory Visit (HOSPITAL_COMMUNITY): Payer: Medicare Other

## 2013-08-14 NOTE — Telephone Encounter (Signed)
Patient stated that this appointment is fine

## 2013-08-27 ENCOUNTER — Ambulatory Visit (HOSPITAL_COMMUNITY)
Admission: RE | Admit: 2013-08-27 | Discharge: 2013-08-27 | Disposition: A | Discharge status: Home / Self Care | Payer: Medicare Other | Source: Ambulatory Visit | Attending: Family Medicine | Admitting: Family Medicine

## 2013-08-27 DIAGNOSIS — M069 Rheumatoid arthritis, unspecified: Secondary | ICD-10-CM | POA: Insufficient documentation

## 2013-08-27 DIAGNOSIS — Z780 Asymptomatic menopausal state: Secondary | ICD-10-CM | POA: Insufficient documentation

## 2013-08-27 DIAGNOSIS — M899 Disorder of bone, unspecified: Secondary | ICD-10-CM | POA: Insufficient documentation

## 2013-08-27 DIAGNOSIS — Z13820 Encounter for screening for osteoporosis: Secondary | ICD-10-CM

## 2013-08-30 ENCOUNTER — Other Ambulatory Visit: Payer: Self-pay | Admitting: Family Medicine

## 2013-08-31 ENCOUNTER — Other Ambulatory Visit: Payer: Self-pay

## 2013-08-31 MED ORDER — ALENDRONATE SODIUM 70 MG PO TABS: 70.0000 mg | ORAL_TABLET | ORAL | Status: DC

## 2013-09-07 ENCOUNTER — Telehealth: Payer: Self-pay | Admitting: Family Medicine

## 2013-09-07 NOTE — Telephone Encounter (Signed)
Please review all the things suggested for management of constipation with her, available on the computer pt ed screen, these are all oTc. Also ask how often she is having a BM, if more often than every 3 days this is NOT constipation, if stool hard, stool softener needed Encourage her top gibve med a bit n moore time. There is the alternative of annual Iv med thru endo, let her know this also

## 2013-09-07 NOTE — Telephone Encounter (Signed)
Please advise 

## 2013-09-08 NOTE — Telephone Encounter (Signed)
Spoke with patient as well as daughter and notified them of the proper way to take the bone builder also the importance of taking miralax daily and adequate water consumption.  Patient states that she will continue med.

## 2013-09-14 ENCOUNTER — Encounter: Payer: Self-pay | Admitting: Gastroenterology

## 2013-09-14 ENCOUNTER — Telehealth: Payer: Self-pay | Admitting: Family Medicine

## 2013-09-14 DIAGNOSIS — K5900 Constipation, unspecified: Secondary | ICD-10-CM

## 2013-09-14 NOTE — Telephone Encounter (Signed)
Please advise.  Gave advise for constipation management in previous call. Told to use stool softener twice daily.  Increase fiber.  Take Miralax daily.  Also to only take laxatives sparingly

## 2013-09-14 NOTE — Telephone Encounter (Signed)
pls call and let pt know she is being referred to her gI dOc Fields for help with the problem, I have entered the  referral

## 2013-09-14 NOTE — Telephone Encounter (Signed)
pls review that she is doing what is necessary, fiber and styool softener daily with laxativwe every 3 days is no BM. I will refer her to [facility] specialist for evaluatuion of the constipation pls let her know

## 2013-09-15 NOTE — Telephone Encounter (Signed)
Patient is aware also left message for her daughter that this was going to be done and if she has any questions to please call back

## 2013-09-26 NOTE — Assessment & Plan Note (Signed)
Controlled, no change in medication Hyperlipidemia:Low fat diet discussed and encouraged.  Updated lab for next visit

## 2013-09-26 NOTE — Assessment & Plan Note (Signed)
Stable on current medication.  

## 2013-09-26 NOTE — Assessment & Plan Note (Signed)
Controlled, no change in medication  

## 2013-09-26 NOTE — Assessment & Plan Note (Signed)
Controlled, no change in medication DASH diet and commitment to daily physical activity for a minimum of 30 minutes discussed and encouraged, as a part of hypertension management. The importance of attaining a healthy weight is also discussed.  

## 2013-09-26 NOTE — Assessment & Plan Note (Addendum)
Increased and uncontrolled pain x 1 week with limitation in movement.  anti inflammatories, if no improvement , needs ortho eval

## 2013-09-30 NOTE — Telephone Encounter (Signed)
Patient aware.

## 2013-10-15 ENCOUNTER — Ambulatory Visit: Payer: Medicare Other | Admitting: Gastroenterology

## 2013-10-19 ENCOUNTER — Telehealth: Payer: Self-pay | Admitting: Family Medicine

## 2013-10-19 NOTE — Telephone Encounter (Signed)
Pls ciontact pt , she needs fasting lipid, cmp , none since 02/2013. Lovastatin dose needs to be reduce to 20mg  due to potential interaction with her blood pressure med, pls explain and send in new lower dose. I will d/c current 40mg  dose, thanks Msg in from her pharmacy  about this let her know

## 2013-10-21 ENCOUNTER — Other Ambulatory Visit: Payer: Self-pay

## 2013-10-21 DIAGNOSIS — I10 Essential (primary) hypertension: Secondary | ICD-10-CM

## 2013-10-21 DIAGNOSIS — E785 Hyperlipidemia, unspecified: Secondary | ICD-10-CM

## 2013-10-21 MED ORDER — LOVASTATIN 20 MG PO TABS: 20.0000 mg | ORAL_TABLET | Freq: Every day | ORAL | Status: DC

## 2013-10-21 NOTE — Telephone Encounter (Signed)
Patient and son aware and will mail lab order and sent in new lower dose of lovastatin to her pharmacy

## 2013-10-21 NOTE — Telephone Encounter (Signed)
Called patient-line busy.

## 2013-11-06 ENCOUNTER — Other Ambulatory Visit: Payer: Self-pay | Admitting: Family Medicine

## 2013-11-06 DIAGNOSIS — Z139 Encounter for screening, unspecified: Secondary | ICD-10-CM

## 2013-11-06 LAB — LIPID PANEL
Cholesterol: 138 mg/dL (ref 0–200)
HDL: 51 mg/dL (ref >39)
LDL Cholesterol: 79 mg/dL (ref 0–99)
Total CHOL/HDL Ratio: 2.7 Ratio
Triglycerides: 40 mg/dL (ref <150)
VLDL: 8 mg/dL (ref 0–40)

## 2013-11-06 LAB — COMPREHENSIVE METABOLIC PANEL
ALT: 28 U/L (ref 0–35)
AST: 21 U/L (ref 0–37)
Albumin: 3.6 g/dL (ref 3.5–5.2)
Alkaline Phosphatase: 73 U/L (ref 39–117)
BUN: 15 mg/dL (ref 6–23)
CO2: 31 mEq/L (ref 19–32)
Calcium: 8.8 mg/dL (ref 8.4–10.5)
Chloride: 103 mEq/L (ref 96–112)
Creat: 0.67 mg/dL (ref 0.50–1.10)
Glucose, Bld: 85 mg/dL (ref 70–99)
Potassium: 4.2 mEq/L (ref 3.5–5.3)
Sodium: 141 mEq/L (ref 135–145)
Total Bilirubin: 0.4 mg/dL (ref 0.2–1.2)
Total Protein: 6.5 g/dL (ref 6.0–8.3)

## 2013-11-06 LAB — CBC
HCT: 37.2 % (ref 36.0–46.0)
Hemoglobin: 12.4 g/dL (ref 12.0–15.0)
MCH: 29.9 pg (ref 26.0–34.0)
MCHC: 33.3 g/dL (ref 30.0–36.0)
MCV: 89.6 fL (ref 78.0–100.0)
Platelets: 260 10*3/uL (ref 150–400)
RBC: 4.15 MIL/uL (ref 3.87–5.11)
RDW: 14.3 % (ref 11.5–15.5)
WBC: 4.6 10*3/uL (ref 4.0–10.5)

## 2013-11-07 LAB — VITAMIN D 25 HYDROXY (VIT D DEFICIENCY, FRACTURES): Vit D, 25-Hydroxy: 47 ng/mL (ref 30–89)

## 2013-11-07 LAB — TSH: TSH: 0.975 u[IU]/mL (ref 0.350–4.500)

## 2013-11-13 ENCOUNTER — Ambulatory Visit (HOSPITAL_COMMUNITY): Payer: Medicare Other

## 2013-11-14 ENCOUNTER — Other Ambulatory Visit: Payer: Self-pay | Admitting: Family Medicine

## 2013-12-16 ENCOUNTER — Other Ambulatory Visit: Payer: Self-pay | Admitting: Family Medicine

## 2013-12-17 ENCOUNTER — Encounter (INDEPENDENT_AMBULATORY_CARE_PROVIDER_SITE_OTHER): Payer: Self-pay

## 2013-12-17 ENCOUNTER — Ambulatory Visit (INDEPENDENT_AMBULATORY_CARE_PROVIDER_SITE_OTHER): Payer: Medicare Other | Admitting: Family Medicine

## 2013-12-17 ENCOUNTER — Encounter: Payer: Self-pay | Admitting: Family Medicine

## 2013-12-17 VITALS — BP 128/82 | HR 80 | Resp 16 | Ht 67.0 in | Wt 176.1 lb

## 2013-12-17 DIAGNOSIS — Z9181 History of falling: Secondary | ICD-10-CM

## 2013-12-17 DIAGNOSIS — F429 Obsessive-compulsive disorder, unspecified: Secondary | ICD-10-CM

## 2013-12-17 DIAGNOSIS — I10 Essential (primary) hypertension: Secondary | ICD-10-CM

## 2013-12-17 DIAGNOSIS — R569 Unspecified convulsions: Secondary | ICD-10-CM

## 2013-12-17 DIAGNOSIS — E785 Hyperlipidemia, unspecified: Secondary | ICD-10-CM

## 2013-12-17 DIAGNOSIS — R296 Repeated falls: Secondary | ICD-10-CM

## 2013-12-17 MED ORDER — FLUOXETINE HCL 20 MG PO CAPS: ORAL_CAPSULE | ORAL | Status: DC

## 2013-12-17 NOTE — Patient Instructions (Signed)
Annual physical exam in 4 month, call if you need me before.  Labs in January perfect, no med changes.  Blood pressure is at goal  Please reschedule mammogram   Fasting lipid, cmp in 4 month  Before next visit  Fall Prevention and Home Safety Falls cause injuries and can affect all age groups. It is possible to prevent falls.  HOW TO PREVENT FALLS  Wear shoes with rubber soles that do not have an opening for your toes.  Keep the inside and outside of your house well lit.  Use night lights throughout your home.  Remove clutter from floors.  Clean up floor spills.  Remove throw rugs or fasten them to the floor with carpet tape.  Do not place electrical cords across pathways.  Put grab bars by your tub, shower, and toilet. Do not use towel bars as grab bars.  Put handrails on both sides of the stairway. Fix loose handrails.  Do not climb on stools or stepladders, if possible.  Do not wax your floors.  Repair uneven or unsafe sidewalks, walkways, or stairs.  Keep items you use a lot within reach.  Be aware of pets.  Keep emergency numbers next to the telephone.  Put smoke detectors in your home and near bedrooms. Ask your doctor what other things you can do to prevent falls. Document Released: 07/21/2009 Document Revised: 03/25/2012 Document Reviewed: 12/25/2011 [facility] Patient Information 2014 [place], [place].

## 2013-12-20 NOTE — Assessment & Plan Note (Signed)
Non recent seizure activity , managed by neurology

## 2013-12-20 NOTE — Assessment & Plan Note (Signed)
Controlled, no change in medication DASH diet and commitment to daily physical activity for a minimum of 30 minutes discussed and encouraged, as a part of hypertension management. The importance of attaining a healthy weight is also discussed.  

## 2013-12-20 NOTE — Assessment & Plan Note (Signed)
Fall precautions discussed and info provided also

## 2013-12-20 NOTE — Progress Notes (Signed)
   Subjective:    Patient ID: Alejandra Cruz, female    DOB: 11-Jan-1933, 80 y.o.   MRN: [id number]  HPI The PT is here for follow up and re-evaluation of chronic medical conditions, medication management and review of any available recent lab and radiology data.  Preventive health is updated, specifically  Cancer screening and Immunization. Needs mammogram and will schedule The PT denies any adverse reactions to current medications since the last visit.  There are no new concerns. Denies any falls and reports she has been doing well There are no specific complaints       Review of Systems See HPI Denies recent fever or chills. Denies sinus pressure, nasal congestion, ear pain or sore throat. Denies chest congestion, productive cough or wheezing. Denies chest pains, palpitations and leg swelling Denies abdominal pain, nausea, vomiting,diarrhea or constipation.   Denies dysuria, frequency, hesitancy or incontinence. Chronic  joint pain, and limitation in mobility.Ambulates with a walker Denies headaches, seizures, numbness, or tingling. Denies depression, anxiety or insomnia. Denies skin break down or rash.         Objective:   Physical Exam BP 128/82  Pulse 80  Resp 16  Ht 5\' 7"  (1.702 m)  Wt 176 lb 1.9 oz (79.888 kg)  BMI 27.58 kg/m2  SpO2 97% Patient alert and oriented and in no cardiopulmonary distress.  HEENT: No facial asymmetry, EOMI, no sinus tenderness,  oropharynx pink and moist.  Neck supple no adenopathy.  Chest: Clear to auscultation bilaterally.  CVS: S1, S2 no murmurs, no S3.  ABD: Soft non tender. Bowel sounds normal.  Ext: No edema  MS: decreased ROM spine, shoulders, hips and knees.  Skin: Intact, no ulcerations or rash noted.  Psych: Good eye contact, normal affect. Memory intact not anxious or depressed appearing.  CNS: CN 2-12 intact, power, tone and sensation normal throughout.        Assessment & Plan:  ESSENTIAL  HYPERTENSION Controlled, no change in medication DASH diet and commitment to daily physical activity for a minimum of 30 minutes discussed and encouraged, as a part of hypertension management. The importance of attaining a healthy weight is also discussed.   Recurrent falls Fall precautions discussed and info provided also  HYPERLIPIDEMIA Controlled, no change in medication Hyperlipidemia:Low fat diet discussed and encouraged.    OBSESSIVE-COMPULSIVE DISORDER Stable , continue fluoxetine  SEIZURE DISORDER Non recent seizure activity , managed by neurology

## 2013-12-20 NOTE — Assessment & Plan Note (Signed)
Stable, continue fluoxetine 

## 2013-12-20 NOTE — Assessment & Plan Note (Signed)
Controlled, no change in medication Hyperlipidemia:Low fat diet discussed and encouraged.  \ 

## 2014-01-02 ENCOUNTER — Encounter (HOSPITAL_COMMUNITY): Payer: Self-pay | Admitting: Emergency Medicine

## 2014-01-02 ENCOUNTER — Emergency Department (HOSPITAL_COMMUNITY): Payer: Medicare Other

## 2014-01-02 ENCOUNTER — Inpatient Hospital Stay (HOSPITAL_COMMUNITY)
Admission: EM | Admit: 2014-01-02 | Discharge: 2014-01-04 | DRG: 603 | Disposition: A | Discharge status: Home Health | Payer: Medicare Other | Attending: Internal Medicine | Admitting: Internal Medicine

## 2014-01-02 DIAGNOSIS — L02419 Cutaneous abscess of limb, unspecified: Principal | ICD-10-CM | POA: Diagnosis present

## 2014-01-02 DIAGNOSIS — Z7982 Long term (current) use of aspirin: Secondary | ICD-10-CM

## 2014-01-02 DIAGNOSIS — G40909 Epilepsy, unspecified, not intractable, without status epilepticus: Secondary | ICD-10-CM | POA: Diagnosis present

## 2014-01-02 DIAGNOSIS — Z86718 Personal history of other venous thrombosis and embolism: Secondary | ICD-10-CM

## 2014-01-02 DIAGNOSIS — G2581 Restless legs syndrome: Secondary | ICD-10-CM | POA: Diagnosis present

## 2014-01-02 DIAGNOSIS — I82439 Acute embolism and thrombosis of unspecified popliteal vein: Secondary | ICD-10-CM

## 2014-01-02 DIAGNOSIS — L03115 Cellulitis of right lower limb: Secondary | ICD-10-CM | POA: Diagnosis present

## 2014-01-02 DIAGNOSIS — I824Y9 Acute embolism and thrombosis of unspecified deep veins of unspecified proximal lower extremity: Secondary | ICD-10-CM

## 2014-01-02 DIAGNOSIS — L03119 Cellulitis of unspecified part of limb: Secondary | ICD-10-CM | POA: Diagnosis present

## 2014-01-02 DIAGNOSIS — E785 Hyperlipidemia, unspecified: Secondary | ICD-10-CM | POA: Diagnosis present

## 2014-01-02 DIAGNOSIS — I10 Essential (primary) hypertension: Secondary | ICD-10-CM | POA: Diagnosis present

## 2014-01-02 DIAGNOSIS — K219 Gastro-esophageal reflux disease without esophagitis: Secondary | ICD-10-CM | POA: Diagnosis present

## 2014-01-02 DIAGNOSIS — Z833 Family history of diabetes mellitus: Secondary | ICD-10-CM

## 2014-01-02 LAB — CBC WITH DIFFERENTIAL/PLATELET
BASOS ABS: 0 10*3/uL (ref 0.0–0.1)
BASOS PCT: 0 % (ref 0–1)
Eosinophils Absolute: 0.1 10*3/uL (ref 0.0–0.7)
Eosinophils Relative: 1 % (ref 0–5)
HCT: 36.1 % (ref 36.0–46.0)
HEMOGLOBIN: 11.9 g/dL — AB (ref 12.0–15.0)
Lymphocytes Relative: 39 % (ref 12–46)
Lymphs Abs: 1.8 10*3/uL (ref 0.7–4.0)
MCH: 30.4 pg (ref 26.0–34.0)
MCHC: 33 g/dL (ref 30.0–36.0)
MCV: 92.1 fL (ref 78.0–100.0)
Monocytes Absolute: 0.6 10*3/uL (ref 0.1–1.0)
Monocytes Relative: 12 % (ref 3–12)
NEUTROS PCT: 48 % (ref 43–77)
Neutro Abs: 2.2 10*3/uL (ref 1.7–7.7)
Platelets: 153 10*3/uL (ref 150–400)
RBC: 3.92 MIL/uL (ref 3.87–5.11)
RDW: 15.3 % (ref 11.5–15.5)
WBC: 4.6 10*3/uL (ref 4.0–10.5)

## 2014-01-02 LAB — COMPREHENSIVE METABOLIC PANEL
ALBUMIN: 3.5 g/dL (ref 3.5–5.2)
ALT: 24 U/L (ref 0–35)
AST: 34 U/L (ref 0–37)
Alkaline Phosphatase: 87 U/L (ref 39–117)
BUN: 18 mg/dL (ref 6–23)
CO2: 32 mEq/L (ref 19–32)
Calcium: 9.1 mg/dL (ref 8.4–10.5)
Chloride: 100 mEq/L (ref 96–112)
Creatinine, Ser: 0.57 mg/dL (ref 0.50–1.10)
GFR calc Af Amer: >90 mL/min (ref >90)
GFR calc non Af Amer: 87 mL/min — ABNORMAL LOW (ref >90)
Glucose, Bld: 90 mg/dL (ref 70–99)
POTASSIUM: 4.7 meq/L (ref 3.7–5.3)
Sodium: 141 mEq/L (ref 137–147)
Total Bilirubin: 0.3 mg/dL (ref 0.3–1.2)
Total Protein: 7.2 g/dL (ref 6.0–8.3)

## 2014-01-02 LAB — D-DIMER, QUANTITATIVE (NOT AT ARMC): D DIMER QUANT: 1.94 ug{FEU}/mL — AB (ref 0.00–0.48)

## 2014-01-02 MED ORDER — PIPERACILLIN-TAZOBACTAM 3.375 G IVPB
3.3750 g | Freq: Once | INTRAVENOUS | Status: AC
  Administered 2014-01-02: 3.375 g via INTRAVENOUS
  Filled 2014-01-02: qty 50

## 2014-01-02 MED ORDER — PIPERACILLIN-TAZOBACTAM 3.375 G IVPB
3.3750 g | Freq: Three times a day (TID) | INTRAVENOUS | Status: DC
  Administered 2014-01-02 – 2014-01-04 (×5): 3.375 g via INTRAVENOUS
  Filled 2014-01-02 (×9): qty 50

## 2014-01-02 MED ORDER — ACETAMINOPHEN 325 MG PO TABS: 650.0000 mg | ORAL_TABLET | Freq: Four times a day (QID) | ORAL | Status: DC | PRN

## 2014-01-02 MED ORDER — SODIUM CHLORIDE 0.9 % IJ SOLN: 3.0000 mL | INTRAMUSCULAR | Status: DC | PRN

## 2014-01-02 MED ORDER — OYSTER SHELL CALCIUM/D 500-200 MG-UNIT PO TABS
1.0000 | ORAL_TABLET | Freq: Every day | ORAL | Status: DC
  Administered 2014-01-03 – 2014-01-04 (×2): 1 via ORAL
  Filled 2014-01-02 (×7): qty 1

## 2014-01-02 MED ORDER — ADULT MULTIVITAMIN W/MINERALS CH
1.0000 | ORAL_TABLET | Freq: Every day | ORAL | Status: DC
  Administered 2014-01-03 – 2014-01-04 (×2): 1 via ORAL
  Filled 2014-01-02 (×2): qty 1

## 2014-01-02 MED ORDER — SODIUM CHLORIDE 0.9 % IJ SOLN
3.0000 mL | Freq: Two times a day (BID) | INTRAMUSCULAR | Status: DC
  Administered 2014-01-02 – 2014-01-04 (×3): 3 mL via INTRAVENOUS

## 2014-01-02 MED ORDER — VANCOMYCIN HCL IN DEXTROSE 1-5 GM/200ML-% IV SOLN
1000.0000 mg | Freq: Two times a day (BID) | INTRAVENOUS | Status: DC
  Administered 2014-01-03 – 2014-01-04 (×3): 1000 mg via INTRAVENOUS
  Filled 2014-01-02 (×5): qty 200

## 2014-01-02 MED ORDER — ENOXAPARIN SODIUM 80 MG/0.8ML ~~LOC~~ SOLN
80.0000 mg | Freq: Once | SUBCUTANEOUS | Status: AC
  Administered 2014-01-02: 80 mg via SUBCUTANEOUS
  Filled 2014-01-02: qty 0.8

## 2014-01-02 MED ORDER — FERROUS SULFATE 325 (65 FE) MG PO TBEC
325.0000 mg | DELAYED_RELEASE_TABLET | Freq: Every day | ORAL | Status: DC
Start: 2014-01-03 — End: 2014-01-03
  Filled 2014-01-02 (×3): qty 1

## 2014-01-02 MED ORDER — ASPIRIN 81 MG PO TABS: 81.0000 mg | ORAL_TABLET | Freq: Every day | ORAL | Status: DC

## 2014-01-02 MED ORDER — LEVETIRACETAM 500 MG PO TABS
500.0000 mg | ORAL_TABLET | Freq: Two times a day (BID) | ORAL | Status: DC
  Administered 2014-01-02 – 2014-01-04 (×4): 500 mg via ORAL
  Filled 2014-01-02 (×8): qty 1

## 2014-01-02 MED ORDER — PIPERACILLIN-TAZOBACTAM 3.375 G IVPB
INTRAVENOUS | Status: AC
  Filled 2014-01-02: qty 100

## 2014-01-02 MED ORDER — DILTIAZEM HCL ER COATED BEADS 120 MG PO CP24
120.0000 mg | ORAL_CAPSULE | Freq: Every day | ORAL | Status: DC
  Administered 2014-01-03 – 2014-01-04 (×2): 120 mg via ORAL
  Filled 2014-01-02 (×4): qty 1

## 2014-01-02 MED ORDER — SIMVASTATIN 20 MG PO TABS
20.0000 mg | ORAL_TABLET | Freq: Every day | ORAL | Status: DC
  Administered 2014-01-02: 20 mg via ORAL
  Filled 2014-01-02 (×3): qty 1

## 2014-01-02 MED ORDER — VANCOMYCIN HCL IN DEXTROSE 1-5 GM/200ML-% IV SOLN
INTRAVENOUS | Status: AC
  Filled 2014-01-02: qty 200

## 2014-01-02 MED ORDER — ACETAMINOPHEN 650 MG RE SUPP: 650.0000 mg | Freq: Four times a day (QID) | RECTAL | Status: DC | PRN

## 2014-01-02 MED ORDER — SODIUM CHLORIDE 0.9 % IV SOLN: 250.0000 mL | INTRAVENOUS | Status: DC | PRN

## 2014-01-02 MED ORDER — VANCOMYCIN HCL IN DEXTROSE 1-5 GM/200ML-% IV SOLN
1000.0000 mg | Freq: Once | INTRAVENOUS | Status: AC
  Administered 2014-01-02: 1000 mg via INTRAVENOUS
  Filled 2014-01-02: qty 200

## 2014-01-02 MED ORDER — ONE-DAILY MULTI VITAMINS PO TABS: 1.0000 | ORAL_TABLET | Freq: Every day | ORAL | Status: DC

## 2014-01-02 MED ORDER — ASPIRIN EC 81 MG PO TBEC
81.0000 mg | DELAYED_RELEASE_TABLET | Freq: Every day | ORAL | Status: DC
  Administered 2014-01-03 – 2014-01-04 (×2): 81 mg via ORAL
  Filled 2014-01-02 (×5): qty 1

## 2014-01-02 MED ORDER — ENOXAPARIN SODIUM 80 MG/0.8ML ~~LOC~~ SOLN
1.0000 mg/kg | Freq: Two times a day (BID) | SUBCUTANEOUS | Status: DC
  Administered 2014-01-03: 80 mg via SUBCUTANEOUS
  Filled 2014-01-02 (×2): qty 0.8

## 2014-01-02 MED ORDER — FLUOXETINE HCL 20 MG PO CAPS
20.0000 mg | ORAL_CAPSULE | Freq: Every day | ORAL | Status: DC
  Administered 2014-01-03 – 2014-01-04 (×2): 20 mg via ORAL
  Filled 2014-01-02 (×5): qty 1

## 2014-01-02 NOTE — H&P (Signed)
[facility] History and Physical  Alejandra BruinJoyce A Pile WUJ:[id number] DOB: 12/17/1932 DOA: 01/02/2014  Referring physician: Devoria AlbeIva Knapp, MD PCP: Syliva OvermanMargaret Simpson, MD   Chief Complaint: Swelling of the Right Leg  HPI: Alejandra Cruz is a 80 y.o. female presents to the hospital for swelling and redness of the right leg. Patient states that she was doing fine at her baseline thinks she may have hit her shin on the bed about a week ago and now comes in with increased swelling and also with increased redness of her right leg. She has not had fevers has felt hot. She denies any chills. She has not had any syncope. She denies any h/o diabetes. She has had a prior history of DVT. She is currently not on anticoagulation. She has had a history of bed sores in the past. She denies any recent prolonged plane or car rides.   Review of Systems:  Constitutional:  No weight loss, night sweats, Fevers, chills, fatigue.  HEENT:  No headaches, Difficulty swallowing,Tooth/dental problems,Sore throat,  No sneezing, itching, ear ache, nasal congestion, post nasal drip,  Cardio-vascular:  No chest pain, Orthopnea, PND, ++swelling in lower extremities, anasarca, dizziness, palpitations  GI:  No heartburn, indigestion, abdominal pain, nausea, vomiting, diarrhea, change in bowel habits, loss of appetite  Resp:  No shortness of breath with exertion or at rest. No excess mucus, no productive cough, No non-productive cough, No coughing up of blood.No change in color of mucus.No wheezing.No chest wall deformity  Skin:  ++ erythema RLE.  GU:  no dysuria, change in color of urine, no urgency or frequency. No flank pain.  Musculoskeletal:  No joint pain or swelling. No decreased range of motion. No back pain.  Psych:  No change in mood or affect. No depression or anxiety. No memory loss.   Past Medical History  Diagnosis Date  . Hyperlipidemia   . Hypertension   . Seizures january 2010    Pt was running into the  house to turn off the stove , fell and hit her head,. Since then she has developed seizure disorder   . Seizures     Dr. Gerilyn [place]   . Restless legs syndrome   . GERD (gastroesophageal reflux disease)   . Gastritis, Helicobacter pylori 2010    Rx. ABO for 10 days   . Esophageal web 2010    DILATION 16 MM  . Diverticulosis OCT 2010 TCS    SIGMOID COLON   Past Surgical History  Procedure Laterality Date  . Vesicovaginal fistula closure w/ tah  1966    bleeding   . Colonoscopy  OCT 2010    TORTUOUS, Sml IH, Tics, MULTIPLE SIMPLE ADENOMAS (<6MM)  . Upper gastrointestinal endoscopy  AUG 2010    SAVARY  . Abdominal hysterectomy    . Orif hip fracture  04/05/2012    Procedure: OPEN REDUCTION INTERNAL FIXATION HIP;  Surgeon: Vickki HearingStanley E Harrison, MD;  Location: [facility];  Service: Orthopedics;  Laterality: Right;   Social History:  reports that she has never smoked. She has never used smokeless tobacco. She reports that she does not drink alcohol or use illicit drugs.  No Known Allergies  Family History  Problem Relation Age of Onset  . Cancer Mother     bladder   . Diabetes Mother   . Diabetes Father   . Emphysema Father   . GER disease Brother   . Colon cancer Neg Hx   . Colon polyps Neg Hx  Prior to Admission medications   Medication Sig Start Date End Date Taking? Authorizing Provider  aspirin 81 MG tablet Take 81 mg by mouth daily.   Yes Historical Provider, MD  calcium-vitamin D (OSCAL WITH D) 500-200 MG-UNIT TABS TAKE (1) TABLET BY MOUTH (3) TIMES DAILY WITH MEALS. 01/23/12  Yes Kerri Perches, MD  diltiazem (CARDIZEM CD) 120 MG 24 hr capsule TAKE ONE CAPSULE DAILY FOR BLOOD PRESSURE. 07/30/13  Yes Kerri Perches, MD  ferrous sulfate 325 (65 FE) MG EC tablet Take 325 mg by mouth daily with breakfast.   Yes Historical Provider, MD  FLUoxetine (PROZAC) 20 MG capsule TAKE 1 CAPSULE BY MOUTH ONCE A DAY. 12/17/13  Yes Kerri Perches, MD  levETIRAcetam (KEPPRA) 500 MG  tablet TAKE 1 TABLET BY MOUTH TWICE A DAY AS DIRECTED. 07/30/13  Yes Kerri Perches, MD  lovastatin (MEVACOR) 20 MG tablet Take 1 tablet (20 mg total) by mouth at bedtime. 10/21/13  Yes Kerri Perches, MD  Multiple Vitamin (MULTIVITAMIN) tablet Take 1 tablet by mouth daily.   Yes Historical Provider, MD   Physical Exam: Filed Vitals:   01/02/14 1805  BP: 152/86  Pulse: 58  Temp: 98 F (36.7 C)  Resp: 16    BP 152/86  Pulse 58  Temp(Src) 98 F (36.7 C) (Oral)  Resp 16  Ht 5\' 7"  (1.702 m)  Wt 79.833 kg (176 lb)  BMI 27.56 kg/m2  SpO2 100%  General:  Appears calm and comfortable Eyes: PERRL, normal lids, irises & conjunctiva ENT: grossly normal hearing, lips & tongue Neck: no LAD, masses or thyromegaly Cardiovascular: RRR, no m/r/g. ++RLE Edema and erythema Respiratory: CTA bilaterally, no w/r/r. Normal respiratory effort. Abdomen: soft, ntnd Skin: ++ redness and induration RLE Musculoskeletal: grossly normal tone BUE/BLE Psychiatric: grossly normal mood and affect, speech fluent and appropriate Neurologic: grossly non-focal.          Labs on Admission:  Basic Metabolic Panel:  Recent Labs Lab 01/02/14 1500  NA 141  K 4.7  CL 100  CO2 32  GLUCOSE 90  BUN 18  CREATININE 0.57  CALCIUM 9.1   Liver Function Tests:  Recent Labs Lab 01/02/14 1500  AST 34  ALT 24  ALKPHOS 87  BILITOT 0.3  PROT 7.2  ALBUMIN 3.5   No results found for this basename: LIPASE, AMYLASE,  in the last 168 hours No results found for this basename: AMMONIA,  in the last 168 hours CBC:  Recent Labs Lab 01/02/14 1500  WBC 4.6  NEUTROABS 2.2  HGB 11.9*  HCT 36.1  MCV 92.1  PLT 153   Cardiac Enzymes: No results found for this basename: CKTOTAL, CKMB, CKMBINDEX, TROPONINI,  in the last 168 hours  BNP (last 3 results) No results found for this basename: PROBNP,  in the last 8760 hours CBG: No results found for this basename: GLUCAP,  in the last 168  hours  Radiological Exams on Admission: Dg Tibia/fibula Right  01/02/2014   CLINICAL DATA:  Right leg and foot swelling.  EXAM: RIGHT TIBIA AND FIBULA - 2 VIEW  COMPARISON:  None.  FINDINGS: No fracture. No bone lesion. Bones are demineralized. Knee and ankle joints are normally aligned. There is diffuse subcutaneous soft tissue edema.  IMPRESSION: Diffuse soft tissue edema.  No fracture or bone lesion.   Electronically Signed   By: Amie Portland M.D.   On: 01/02/2014 16:06   Dg Foot Complete Right  01/02/2014   CLINICAL DATA:  Right leg and foot swelling.  EXAM: RIGHT FOOT COMPLETE - 3+ VIEW  COMPARISON:  None.  FINDINGS: No fracture. Joints are normally aligned. Bones are diffusely demineralized. There is a marginal erosion along the medial aspect of the distal great toe proximal phalanx. There are plantar and dorsal calcaneal spurs. Soft tissue edema is noted most evident in the ankle.  IMPRESSION: No fracture or dislocation.  No acute osseous abnormality.   Electronically Signed   By: Amie Portland M.D.   On: 01/02/2014 16:07     Assessment/Plan Principal Problem:   Cellulitis of leg without foot, right Active Problems:   ESSENTIAL HYPERTENSION   DVT of popliteal vein   Cellulitis of leg   1. Cellulitis of Right Leg -admit to floor for antibiotics -no blood cultures drawn in ED prior to abx -started on vancocin and zosyn  2. History of DVT -Ultrasound has been ordered to look for DVT -will start on lovenox therapeutic dosage  3. Hypertension -currently controlled -monitor closely   Code Status: full code (must indicate code status--if unknown or must be presumed, indicate so) Family Communication: daughter present in room (indicate person spoken with, if applicable, with phone number if by telephone) Disposition Plan: home (indicate anticipated LOS)  Time spent:  [facility] Pager [phone]

## 2014-01-02 NOTE — ED Notes (Signed)
Complain of pain and swelling in right lower leg

## 2014-01-02 NOTE — ED Provider Notes (Signed)
CSN: [id number]     Arrival date & time 01/02/14  1304 History  This chart was scribed for Ward GivensIva L Theo Reither, MD by Quintella ReichertMatthew Underwood, ED scribe.  This patient was seen in room APA14/APA14 and the patient's care was started at 2:35 PM.   Chief Complaint  Patient presents with  . Leg Swelling    The history is provided by the patient. No language interpreter was used.    HPI Comments: Alejandra Cruz is a 80 y.o. female who presents to the Emergency Department complaining of right lower leg swelling that began yesterday.  Pt's relative last saw her leg one week ago and states the leg did not appear swollen at all at that time.  Pt denies pain to the leg.  She denies fever, chills, CP, or SOB.  She denies prior h/o leg swelling.  She denies recent travel although she fractured a hip 2 years ago this July and now uses a walker although family states she does a lot of walking.  She denies any other h/o hospitalization.  She denies h/o DM.  She notes that she had a bed sore on her right ankle [facility] which was treated and has been healed for some time.  Pt lives at home with her son. Daughter is concerned that patient may have fallen and hurt her leg. She states the patient wouldn't tell her if she had fallen.  PCP is Syliva OvermanMargaret Simpson, MD    Past Medical History  Diagnosis Date  . Hyperlipidemia   . Hypertension   . Seizures january 2010    Pt was running into the house to turn off the stove , fell and hit her head,. Since then she has developed seizure disorder   . Seizures     Dr. Gerilyn [place]   . Restless legs syndrome   . GERD (gastroesophageal reflux disease)   . Gastritis, Helicobacter pylori 2010    Rx. ABO for 10 days   . Esophageal web 2010    DILATION 16 MM  . Diverticulosis OCT 2010 TCS    SIGMOID COLON    Past Surgical History  Procedure Laterality Date  . Vesicovaginal fistula closure w/ tah  1966    bleeding   . Colonoscopy  OCT 2010    TORTUOUS, Sml IH,  Tics, MULTIPLE SIMPLE ADENOMAS (<6MM)  . Upper gastrointestinal endoscopy  AUG 2010    SAVARY  . Abdominal hysterectomy    . Orif hip fracture  04/05/2012    Procedure: OPEN REDUCTION INTERNAL FIXATION HIP;  Surgeon: Vickki HearingStanley E Harrison, MD;  Location: [facility];  Service: Orthopedics;  Laterality: Right;    Family History  Problem Relation Age of Onset  . Cancer Mother     bladder   . Diabetes Mother   . Diabetes Father   . Emphysema Father   . GER disease Brother   . Colon cancer Neg Hx   . Colon polyps Neg Hx     History  Substance Use Topics  . Smoking status: Never Smoker   . Smokeless tobacco: Never Used  . Alcohol Use: No  lives at home Lives with her son Uses a walker, per daughter she is very ambulatory  OB History   Grav Para Term Preterm Abortions TAB SAB Ect Mult Living                   Review of Systems  Constitutional: Negative for fever and chills.  Respiratory:  Negative for shortness of breath.   Cardiovascular: Positive for leg swelling (right leg). Negative for chest pain.  All other systems reviewed and are negative.      Allergies  Review of patient's allergies indicates no known allergies.  Home Medications   Current Outpatient Rx  Name  Route  Sig  Dispense  Refill  . aspirin 81 MG tablet   Oral   Take 81 mg by mouth daily.         . calcium-vitamin D (OSCAL WITH D) 500-200 MG-UNIT TABS      TAKE (1) TABLET BY MOUTH (3) TIMES DAILY WITH MEALS.   90 each   3   . diltiazem (CARDIZEM CD) 120 MG 24 hr capsule      TAKE ONE CAPSULE DAILY FOR BLOOD PRESSURE.   30 capsule   5   . ferrous sulfate 325 (65 FE) MG EC tablet   Oral   Take 325 mg by mouth daily with breakfast.         . FLUoxetine (PROZAC) 20 MG capsule      TAKE 1 CAPSULE BY MOUTH ONCE A DAY.   30 capsule   4   . levETIRAcetam (KEPPRA) 500 MG tablet      TAKE 1 TABLET BY MOUTH TWICE A DAY AS DIRECTED.   60 tablet   5   . lovastatin (MEVACOR) 20 MG tablet    Oral   Take 1 tablet (20 mg total) by mouth at bedtime.   30 tablet   3     D/c lovastatin 40mg    . Multiple Vitamin (MULTIVITAMIN) tablet   Oral   Take 1 tablet by mouth daily.          BP 137/57  Pulse 60  Temp(Src) 97.9 F (36.6 C) (Oral)  Resp 12  SpO2 100%  Vital signs normal    Physical Exam  Nursing note and vitals reviewed. Constitutional: She is oriented to person, place, and time. She appears well-developed and well-nourished.  Non-toxic appearance. She does not appear ill. No distress.  HENT:  Head: Normocephalic and atraumatic.  Right Ear: External ear normal.  Left Ear: External ear normal.  Nose: Nose normal. No mucosal edema or rhinorrhea.  Mouth/Throat: Oropharynx is clear and moist and mucous membranes are normal. No dental abscesses or uvula swelling.  Eyes: Conjunctivae and EOM are normal. Pupils are equal, round, and reactive to light.  Neck: Normal range of motion and full passive range of motion without pain. Neck supple.  Cardiovascular: Normal rate, regular rhythm and normal heart sounds.  Exam reveals no gallop and no friction rub.   No murmur heard. Pulmonary/Chest: Effort normal and breath sounds normal. No respiratory distress. She has no wheezes. She has no rhonchi. She has no rales. She exhibits no tenderness and no crepitus.  Abdominal: Soft. Normal appearance and bowel sounds are normal. She exhibits no distension. There is no tenderness. There is no rebound and no guarding.  Musculoskeletal: Normal range of motion. She exhibits edema and tenderness.  Moves all extremities well.  Right leg diffusely swollen, with redness of the skin, warmth, and pitting edema  Neurological: She is alert and oriented to person, place, and time. She has normal strength. No cranial nerve deficit.  Skin: Skin is warm, dry and intact. No rash noted. No pallor.  No wounds on right foot, including sole  Psychiatric: She has a normal mood and affect. Her speech is  normal and behavior is normal. Her  mood appears not anxious.      ED Course  Procedures (including critical care time)  Medications  FLUoxetine (PROZAC) capsule 20 mg (not administered)  simvastatin (ZOCOR) tablet 20 mg (not administered)  diltiazem (CARDIZEM CD) 24 hr capsule 120 mg (not administered)  levETIRAcetam (KEPPRA) tablet 500 mg (not administered)  ferrous sulfate EC tablet 325 mg (not administered)  calcium-vitamin D (OSCAL WITH D) 500-200 MG-UNIT per tablet 1 tablet (not administered)  sodium chloride 0.9 % injection 3 mL (not administered)  sodium chloride 0.9 % injection 3 mL (not administered)  0.9 %  sodium chloride infusion (not administered)  acetaminophen (TYLENOL) tablet 650 mg (not administered)    Or  acetaminophen (TYLENOL) suppository 650 mg (not administered)  enoxaparin (LOVENOX) injection 1 mg/kg (not administered)  aspirin EC tablet 81 mg (not administered)  multivitamin with minerals tablet 1 tablet (not administered)  vancomycin (VANCOCIN) IVPB 1000 mg/200 mL premix (0 mg Intravenous Stopped 01/02/14 1739)  piperacillin-tazobactam (ZOSYN) IVPB 3.375 g (0 g Intravenous Stopped 01/02/14 1600)  enoxaparin (LOVENOX) injection 80 mg (80 mg Subcutaneous Given 01/02/14 1634)     DIAGNOSTIC STUDIES: Oxygen Saturation is 100% on room air, normal by my interpretation.    COORDINATION OF CARE: 2:44 PM-Discussed treatment plan which includes IV vancomycin, Zosyn, imaging, labs, and likely admission with pt at bedside and pt agreed to plan. Patient started on antibiotics for possible cellulitis.  4:18 PM-Informed pt that labs and imaging are unconcerning apart from elevated D-Dimer.  Discussed treatment plan which includes admission with pt and family at bedside and pt agreed to plan.  Patient was started on Lovenox. Unfortunately on the weekend we are unable to get Doppler ultrasounds except in the morning. They've already gone for the day.  16:28 Dr Welton Flakes, will  look at patient and decide if she needs to be admitted overnight.   17: 45 Patient states she's been admitted by Dr. Welton Flakes.     Results for orders placed during the hospital encounter of 01/02/14  CBC WITH DIFFERENTIAL      Result Value Ref Range   WBC 4.6  4.0 - 10.5 K/uL   RBC 3.92  3.87 - 5.11 MIL/uL   Hemoglobin 11.9 (*) 12.0 - 15.0 g/dL   HCT 04.5  40.9 - 81.1 %   MCV 92.1  78.0 - 100.0 fL   MCH 30.4  26.0 - 34.0 pg   MCHC 33.0  30.0 - 36.0 g/dL   RDW 91.4  78.2 - 95.6 %   Platelets 153  150 - 400 K/uL   Neutrophils Relative % 48  43 - 77 %   Neutro Abs 2.2  1.7 - 7.7 K/uL   Lymphocytes Relative 39  12 - 46 %   Lymphs Abs 1.8  0.7 - 4.0 K/uL   Monocytes Relative 12  3 - 12 %   Monocytes Absolute 0.6  0.1 - 1.0 K/uL   Eosinophils Relative 1  0 - 5 %   Eosinophils Absolute 0.1  0.0 - 0.7 K/uL   Basophils Relative 0  0 - 1 %   Basophils Absolute 0.0  0.0 - 0.1 K/uL  COMPREHENSIVE METABOLIC PANEL      Result Value Ref Range   Sodium 141  137 - 147 mEq/L   Potassium 4.7  3.7 - 5.3 mEq/L   Chloride 100  96 - 112 mEq/L   CO2 32  19 - 32 mEq/L   Glucose, Bld 90  70 - 99 mg/dL  BUN 18  6 - 23 mg/dL   Creatinine, Ser 1.61  0.50 - 1.10 mg/dL   Calcium 9.1  8.4 - 09.6 mg/dL   Total Protein 7.2  6.0 - 8.3 g/dL   Albumin 3.5  3.5 - 5.2 g/dL   AST 34  0 - 37 U/L   ALT 24  0 - 35 U/L   Alkaline Phosphatase 87  39 - 117 U/L   Total Bilirubin 0.3  0.3 - 1.2 mg/dL   GFR calc non Af Amer 87 (*) >90 mL/min   GFR calc Af Amer >90  >90 mL/min  D-DIMER, QUANTITATIVE      Result Value Ref Range   D-Dimer, Quant 1.94 (*) 0.00 - 0.48 ug/mL-FEU   Laboratory interpretation all normal except elevated d-dimer   Dg Tibia/fibula Right  01/02/2014   CLINICAL DATA:  Right leg and foot swelling.  EXAM: RIGHT TIBIA AND FIBULA - 2 VIEW  COMPARISON:  None.  FINDINGS: No fracture. No bone lesion. Bones are demineralized. Knee and ankle joints are normally aligned. There is diffuse subcutaneous  soft tissue edema.  IMPRESSION: Diffuse soft tissue edema.  No fracture or bone lesion.   Electronically Signed   By: Amie Portland M.D.   On: 01/02/2014 16:06    Dg Foot Complete Right  01/02/2014   CLINICAL DATA:  Right leg and foot swelling.  EXAM: RIGHT FOOT COMPLETE - 3+ VIEW  COMPARISON:  None.  FINDINGS: No fracture. Joints are normally aligned. Bones are diffusely demineralized. There is a marginal erosion along the medial aspect of the distal great toe proximal phalanx. There are plantar and dorsal calcaneal spurs. Soft tissue edema is noted most evident in the ankle.  IMPRESSION: No fracture or dislocation.  No acute osseous abnormality.   Electronically Signed   By: Amie Portland M.D.   On: 01/02/2014 16:07      MDM  patient presents with acute swelling with redness and warmth of her right lower extremity. She was started on treatment for possible cellulitis, however her d-dimer was elevated. There was a concern for DVT. Patient had no chest pain or shortness of breath so CT angiogram of the chest was not warranted. She was started on Lovenox as a precaution. She is being admitted for IV antibiotics and she will get Doppler ultrasound in the morning to see if she has DVT.    Final diagnoses:  Cellulitis of right lower leg   Plan admission  Devoria Albe, MD, FACEP   I personally performed the services described in this documentation, which was scribed in my presence. The recorded information has been reviewed and considered.  Devoria Albe, MD, FACEP    Ward Givens, MD 01/02/14 [phone]

## 2014-01-02 NOTE — Progress Notes (Signed)
ANTIBIOTIC CONSULT NOTE - INITIAL  Pharmacy Consult for Vancomycin and Zosyn Indication: cellulitis  No Known Allergies  Patient Measurements: Height: 5\' 7"  (170.2 cm) Weight: 176 lb (79.833 kg) IBW/kg (Calculated) : 61.6  Vital Signs: Temp: 98 F (36.7 C) (03/28 1805) Temp src: Oral (03/28 1805) BP: 152/86 mmHg (03/28 1805) Pulse Rate: 58 (03/28 1805) Intake/Output from previous day:   Intake/Output from this shift:    Labs:  Recent Labs  01/02/14 1500  WBC 4.6  HGB 11.9*  PLT 153  CREATININE 0.57   Estimated Creatinine Clearance: 63 ml/min (by C-G formula based on Cr of 0.57). No results found for this basename: VANCOTROUGH, VANCOPEAK, VANCORANDOM, GENTTROUGH, GENTPEAK, GENTRANDOM, TOBRATROUGH, TOBRAPEAK, TOBRARND, AMIKACINPEAK, AMIKACINTROU, AMIKACIN,  in the last 72 hours   Microbiology: No results found for this or any previous visit (from the past 720 hour(s)).  Medical History: Past Medical History  Diagnosis Date  . Hyperlipidemia   . Hypertension   . Seizures january 2010    Pt was running into the house to turn off the stove , fell and hit her head,. Since then she has developed seizure disorder   . Seizures     Dr. Gerilyn [place]   . Restless legs syndrome   . GERD (gastroesophageal reflux disease)   . Gastritis, Helicobacter pylori 2010    Rx. ABO for 10 days   . Esophageal web 2010    DILATION 16 MM  . Diverticulosis OCT 2010 TCS    SIGMOID COLON    Medications:  Scheduled:  . aspirin EC  81 mg Oral Daily  . calcium-vitamin D  1 tablet Oral Daily  . [START ON 01/03/2014] diltiazem  120 mg Oral Daily  . enoxaparin (LOVENOX) injection  1 mg/kg Subcutaneous Q12H  . [START ON 01/03/2014] ferrous sulfate  325 mg Oral Q breakfast  . FLUoxetine  20 mg Oral Daily  . levETIRAcetam  500 mg Oral BID  . multivitamin with minerals  1 tablet Oral Daily  . piperacillin-tazobactam (ZOSYN)  IV  3.375 g Intravenous Q8H  . simvastatin  20 mg Oral q1800  .  sodium chloride  3 mL Intravenous Q12H  . [START ON 01/03/2014] vancomycin  1,000 mg Intravenous Q12H   Assessment: 80yo female admitted with LE cellulitis.  Renal fxn is OK.   Estimated Creatinine Clearance: 63 ml/min (by C-G formula based on Cr of 0.57).  Vancomycin 3/28 >> Zosyn 3/28 >>  Goal of Therapy:  Vancomycin trough level 10-15 mcg/ml  Plan:  Vancomycin 1gm IV q12hrs Check trough at steady state Zosyn 3.375gm IV q8h, each dose over 4 hrs Monitor labs, renal fxn, and cultures  Valrie HartHall, Ikhlas Albo A 01/02/2014,6:15 PM

## 2014-01-03 ENCOUNTER — Observation Stay (HOSPITAL_COMMUNITY): Payer: Medicare Other

## 2014-01-03 DIAGNOSIS — L02419 Cutaneous abscess of limb, unspecified: Principal | ICD-10-CM

## 2014-01-03 DIAGNOSIS — I10 Essential (primary) hypertension: Secondary | ICD-10-CM

## 2014-01-03 DIAGNOSIS — L03119 Cellulitis of unspecified part of limb: Principal | ICD-10-CM

## 2014-01-03 LAB — COMPREHENSIVE METABOLIC PANEL
ALK PHOS: 69 U/L (ref 39–117)
ALT: 22 U/L (ref 0–35)
AST: 28 U/L (ref 0–37)
Albumin: 3 g/dL — ABNORMAL LOW (ref 3.5–5.2)
BUN: 15 mg/dL (ref 6–23)
CO2: 32 mEq/L (ref 19–32)
Calcium: 8.8 mg/dL (ref 8.4–10.5)
Chloride: 102 mEq/L (ref 96–112)
Creatinine, Ser: 0.83 mg/dL (ref 0.50–1.10)
GFR calc Af Amer: 76 mL/min — ABNORMAL LOW (ref >90)
GFR, EST NON AFRICAN AMERICAN: 66 mL/min — AB (ref >90)
Glucose, Bld: 104 mg/dL — ABNORMAL HIGH (ref 70–99)
Potassium: 3.8 mEq/L (ref 3.7–5.3)
Sodium: 141 mEq/L (ref 137–147)
Total Bilirubin: 0.5 mg/dL (ref 0.3–1.2)
Total Protein: 6.5 g/dL (ref 6.0–8.3)

## 2014-01-03 LAB — CBC
HCT: 33.6 % — ABNORMAL LOW (ref 36.0–46.0)
Hemoglobin: 11.1 g/dL — ABNORMAL LOW (ref 12.0–15.0)
MCH: 30.9 pg (ref 26.0–34.0)
MCHC: 33 g/dL (ref 30.0–36.0)
MCV: 93.6 fL (ref 78.0–100.0)
PLATELETS: 135 10*3/uL — AB (ref 150–400)
RBC: 3.59 MIL/uL — ABNORMAL LOW (ref 3.87–5.11)
RDW: 15.2 % (ref 11.5–15.5)
WBC: 4.2 10*3/uL (ref 4.0–10.5)

## 2014-01-03 LAB — PRO B NATRIURETIC PEPTIDE: Pro B Natriuretic peptide (BNP): 398.2 pg/mL (ref 0–450)

## 2014-01-03 LAB — PROTIME-INR
INR: 1.15 (ref 0.00–1.49)
PROTHROMBIN TIME: 14.5 s (ref 11.6–15.2)

## 2014-01-03 LAB — APTT: APTT: 37 s (ref 24–37)

## 2014-01-03 MED ORDER — FERROUS SULFATE 325 (65 FE) MG PO TABS
325.0000 mg | ORAL_TABLET | Freq: Every day | ORAL | Status: DC
  Administered 2014-01-03 – 2014-01-04 (×2): 325 mg via ORAL
  Filled 2014-01-03 (×2): qty 1

## 2014-01-03 MED ORDER — ENOXAPARIN SODIUM 40 MG/0.4ML ~~LOC~~ SOLN
40.0000 mg | SUBCUTANEOUS | Status: DC
  Administered 2014-01-03 – 2014-01-04 (×2): 40 mg via SUBCUTANEOUS
  Filled 2014-01-03 (×2): qty 0.4

## 2014-01-03 MED ORDER — ATORVASTATIN CALCIUM 10 MG PO TABS
10.0000 mg | ORAL_TABLET | Freq: Every day | ORAL | Status: DC
  Administered 2014-01-03: 10 mg via ORAL
  Filled 2014-01-03: qty 1

## 2014-01-03 NOTE — Progress Notes (Signed)
[facility] PROGRESS NOTE  Alejandra BruinJoyce A Cruz ZOX:[id number] DOB: 1932/11/03 DOA: 01/02/2014 PCP: Syliva OvermanMargaret Simpson, MD  Assessment/Plan: 1. Cellulitis of right lower extremity. Patient is currently on vancomycin and Zosyn. She is afebrile. We'll continue for one more day and likely transition to by mouth antibiotics tomorrow. Keep lower extremity elevated. 2. History of DVT. Lower extremity venous Dopplers do not indicate any underlying DVT at this time. Continue with prophylactic dose Lovenox. 3. Hypertension. Continue outpatient regimen 4. History of seizures. Continue Keppra  Code Status: full code Family Communication: Discussed with daughter over the phone Disposition Plan: discharge home once improved   Consultants:    Procedures:    Antibiotics:  Vancomycin 3/28  Zosyn 3/28  HPI/Subjective: No new complaints  Objective: Filed Vitals:   01/03/14 1300  BP: 132/72  Pulse: 62  Temp: 98.1 F (36.7 C)  Resp: 16    Intake/Output Summary (Last 24 hours) at 01/03/14 1442 Last data filed at 01/03/14 1300  Gross per 24 hour  Intake    120 ml  Output    500 ml  Net   -380 ml   Filed Weights   01/02/14 1736 01/03/14 0412  Weight: 79.833 kg (176 lb) 81.4 kg (179 lb 7.3 oz)    Exam:   General:  NAD  Cardiovascular: S1, S2 RRR  Respiratory: CTA B  Abdomen: soft, nt,nd, bs+  Musculoskeletal: RLE edema, nontender, no significant erythema.    Data Reviewed: Basic Metabolic Panel:  Recent Labs Lab 01/02/14 1500 01/03/14 0516  NA 141 141  K 4.7 3.8  CL 100 102  CO2 32 32  GLUCOSE 90 104*  BUN 18 15  CREATININE 0.57 0.83  CALCIUM 9.1 8.8   Liver Function Tests:  Recent Labs Lab 01/02/14 1500 01/03/14 0516  AST 34 28  ALT 24 22  ALKPHOS 87 69  BILITOT 0.3 0.5  PROT 7.2 6.5  ALBUMIN 3.5 3.0*   No results found for this basename: LIPASE, AMYLASE,  in the last 168 hours No results found for this basename: AMMONIA,  in the last 168  hours CBC:  Recent Labs Lab 01/02/14 1500 01/03/14 0516  WBC 4.6 4.2  NEUTROABS 2.2  --   HGB 11.9* 11.1*  HCT 36.1 33.6*  MCV 92.1 93.6  PLT 153 135*   Cardiac Enzymes: No results found for this basename: CKTOTAL, CKMB, CKMBINDEX, TROPONINI,  in the last 168 hours BNP (last 3 results)  Recent Labs  01/03/14 1052  PROBNP 398.2   CBG: No results found for this basename: GLUCAP,  in the last 168 hours  No results found for this or any previous visit (from the past 240 hour(s)).   Studies: Dg Tibia/fibula Right  01/02/2014   CLINICAL DATA:  Right leg and foot swelling.  EXAM: RIGHT TIBIA AND FIBULA - 2 VIEW  COMPARISON:  None.  FINDINGS: No fracture. No bone lesion. Bones are demineralized. Knee and ankle joints are normally aligned. There is diffuse subcutaneous soft tissue edema.  IMPRESSION: Diffuse soft tissue edema.  No fracture or bone lesion.   Electronically Signed   By: Amie Portlandavid  Ormond M.D.   On: 01/02/2014 16:06   [place] Venous Img Lower Unilateral Right  01/03/2014   CLINICAL DATA:  Right leg swelling and positive D-dimer.  EXAM: RIGHT LOWER EXTREMITY VENOUS DOPPLER ULTRASOUND  TECHNIQUE: Gray-scale sonography with graded compression, as well as color Doppler and duplex ultrasound, were performed to evaluate the deep venous system from the level of the common  femoral vein through the popliteal and proximal calf veins. Spectral Doppler was utilized to evaluate flow at rest and with distal augmentation maneuvers.  COMPARISON:  04/14/2012  FINDINGS: Thrombus within deep veins:  None visualized.  Normal compressibility, augmentation and color Doppler flow in the right common femoral vein, right femoral vein and right popliteal vein. The visualized right calf veins are patent. Visualized great saphenous vein is patent.  Other findings:  Right calf edema.  IMPRESSION: Negative for right lower extremity DVT.   Electronically Signed   By: Richarda Overlie M.D.   On: 01/03/2014 10:27   Dg Foot  Complete Right  01/02/2014   CLINICAL DATA:  Right leg and foot swelling.  EXAM: RIGHT FOOT COMPLETE - 3+ VIEW  COMPARISON:  None.  FINDINGS: No fracture. Joints are normally aligned. Bones are diffusely demineralized. There is a marginal erosion along the medial aspect of the distal great toe proximal phalanx. There are plantar and dorsal calcaneal spurs. Soft tissue edema is noted most evident in the ankle.  IMPRESSION: No fracture or dislocation.  No acute osseous abnormality.   Electronically Signed   By: Amie Portland M.D.   On: 01/02/2014 16:07    Scheduled Meds: . aspirin EC  81 mg Oral Daily  . atorvastatin  10 mg Oral q1800  . calcium-vitamin D  1 tablet Oral Daily  . diltiazem  120 mg Oral Daily  . enoxaparin (LOVENOX) injection  40 mg Subcutaneous Q24H  . ferrous sulfate  325 mg Oral Q breakfast  . FLUoxetine  20 mg Oral Daily  . levETIRAcetam  500 mg Oral BID  . multivitamin with minerals  1 tablet Oral Daily  . piperacillin-tazobactam (ZOSYN)  IV  3.375 g Intravenous Q8H  . sodium chloride  3 mL Intravenous Q12H  . vancomycin  1,000 mg Intravenous Q12H   Continuous Infusions:   Principal Problem:   Cellulitis of leg without foot, right Active Problems:   ESSENTIAL HYPERTENSION   DVT of popliteal vein   Cellulitis of leg    Time spent:    Caidin Heidenreich  [facility] Pager [phone]. If 7PM-7AM, please contact night-coverage at [email or web address], password [facility] 01/03/2014, 2:42 PM  LOS: 1 day

## 2014-01-04 LAB — CBC
HCT: 33.3 % — ABNORMAL LOW (ref 36.0–46.0)
HEMOGLOBIN: 11.2 g/dL — AB (ref 12.0–15.0)
MCH: 31.2 pg (ref 26.0–34.0)
MCHC: 33.6 g/dL (ref 30.0–36.0)
MCV: 92.8 fL (ref 78.0–100.0)
PLATELETS: 127 10*3/uL — AB (ref 150–400)
RBC: 3.59 MIL/uL — ABNORMAL LOW (ref 3.87–5.11)
RDW: 15.4 % (ref 11.5–15.5)
WBC: 4.6 10*3/uL (ref 4.0–10.5)

## 2014-01-04 LAB — BASIC METABOLIC PANEL
BUN: 17 mg/dL (ref 6–23)
CO2: 32 mEq/L (ref 19–32)
Calcium: 8.2 mg/dL — ABNORMAL LOW (ref 8.4–10.5)
Chloride: 105 mEq/L (ref 96–112)
Creatinine, Ser: 0.77 mg/dL (ref 0.50–1.10)
GFR, EST NON AFRICAN AMERICAN: 78 mL/min — AB (ref >90)
GLUCOSE: 91 mg/dL (ref 70–99)
POTASSIUM: 3.9 meq/L (ref 3.7–5.3)
Sodium: 144 mEq/L (ref 137–147)

## 2014-01-04 MED ORDER — SULFAMETHOXAZOLE-TMP DS 800-160 MG PO TABS: 1.0000 | ORAL_TABLET | Freq: Two times a day (BID) | ORAL | Status: DC

## 2014-01-04 NOTE — Care Management Note (Signed)
    Page 1 of 2   01/04/2014     1:12:43 PM   CARE MANAGEMENT NOTE 01/04/2014  Patient:  Alejandra Cruz,Alejandra Cruz   Account Number:  [id number]  Date Initiated:  01/04/2014  Documentation initiated by:  Sharrie RothmanBLACKWELL,Refael Fulop C  Subjective/Objective Assessment:   Pt admitted from home with cellulitis. Pt lives alone but currently has Cruz son that is staying with her and her daughter is in the home every day. Pt has Cruz walker for home use but states that she is fairly independent with ADL's. Family does     Action/Plan:   the cooking and cleaning. Pt also has Cruz bath seat and 3N1. Pt would like HH RN at discharge and chooses [facility] (per pts choice). Alroy BailiffLinda Lothian is aware and will collect the pts information from the chart. HH services to start within 48 hours   Anticipated DC Date:  01/04/2014   Anticipated DC Plan:  HOME W HOME HEALTH SERVICES      DC Planning Services  CM consult      [facility]   Choice offered to / List presented to:  C-1 Patient        HH arranged  HH-1 RN      [facility]   Status of service:  Completed, signed off Medicare Important Message given?  NA - LOS <3 / Initial given by admissions (If response is "NO", the following Medicare IM given date fields will be blank) Date Medicare IM given:   Date Additional Medicare IM given:    Discharge Disposition:  HOME W HOME HEALTH SERVICES  Per UR Regulation:    If discussed at Long Length of Stay Meetings, dates discussed:    Comments:  01/04/14 1310 Arlyss Queenammy Mihir Flanigan, RN BSN CM of discharge. Pt and pts nurse aware of discharge arrangements.

## 2014-01-04 NOTE — Progress Notes (Signed)
Instructions reviewed with patients grandson. Understanding verbalized. Patient being taken out via wheelchair for discharge home.

## 2014-01-04 NOTE — Discharge Instructions (Signed)
Cellulitis Cellulitis is an infection of the skin and the tissue beneath it. The infected area is usually red and tender. Cellulitis occurs most often in the arms and lower legs.  CAUSES  Cellulitis is caused by bacteria that enter the skin through cracks or cuts in the skin. The most common types of bacteria that cause cellulitis are Staphylococcus and Streptococcus. SYMPTOMS   Redness and warmth.  Swelling.  Tenderness or pain.  Fever. DIAGNOSIS  Your caregiver can usually determine what is wrong based on a physical exam. Blood tests may also be done. TREATMENT  Treatment usually involves taking an antibiotic medicine. HOME CARE INSTRUCTIONS   Take your antibiotics as directed. Finish them even if you start to feel better.  Keep the infected arm or leg elevated to reduce swelling.  Apply a warm cloth to the affected area up to 4 times per day to relieve pain.  Only take over-the-counter or prescription medicines for pain, discomfort, or fever as directed by your caregiver.  Keep all follow-up appointments as directed by your caregiver. SEEK MEDICAL CARE IF:   You notice red streaks coming from the infected area.  Your red area gets larger or turns dark in color.  Your bone or joint underneath the infected area becomes painful after the skin has healed.  Your infection returns in the same area or another area.  You notice a swollen bump in the infected area.  You develop new symptoms. SEEK IMMEDIATE MEDICAL CARE IF:   You have a fever.  You feel very sleepy.  You develop vomiting or diarrhea.  You have a general ill feeling (malaise) with muscle aches and pains. MAKE SURE YOU:   Understand these instructions.  Will watch your condition.  Will get help right away if you are not doing well or get worse. Document Released: 07/04/2005 Document Revised: 03/25/2012 Document Reviewed: 12/10/2011 Exit[employer] Patient Information 2014 [employer].  

## 2014-01-04 NOTE — Discharge Summary (Signed)
Physician Discharge Summary  Alejandra Cruz:[id number] DOB: Jun 14, 1933 DOA: 01/02/2014  PCP: Syliva Overman, MD  Admit date: 01/02/2014 Discharge date: 01/04/2014  Time spent: 40 minutes  Recommendations for Outpatient Follow-up:  1. Followup primary care physician one week  Discharge Diagnoses:  Principal Problem:   Cellulitis of leg without foot, right Active Problems:   ESSENTIAL HYPERTENSION   Cellulitis of leg   Discharge Condition: Improved  Diet recommendation: Low salt  Filed Weights   01/02/14 1736 01/03/14 0412 01/04/14 0529  Weight: 79.833 kg (176 lb) 81.4 kg (179 lb 7.3 oz) 82.3 kg (181 lb 7 oz)    History of present illness:  Alejandra Cruz is a 80 y.o. female presents to the hospital for swelling and redness of the right leg. Patient states that she was doing fine at her baseline thinks she may have hit her shin on the bed about a week ago and now comes in with increased swelling and also with increased redness of her right leg. She has not had fevers has felt hot. She denies any chills. She has not had any syncope. She denies any h/o diabetes. She has had a prior history of DVT. She is currently not on anticoagulation. She has had a history of bed sores in the past. She denies any recent prolonged plane or car rides.   Hospital Course:  This patient was admitted to the hospital with right lower shin cellulitis. Venous Dopplers were negative for underlying DVT. She was started on vancomycin and Zosyn. Erythema and swelling have started to improve. She is not febrile and has a normal WBC count. Antibiotics have been transitioned to Bactrim. She's been advised to keep her leg elevated. She is being discharged from the hospital today.  Procedures:    Consultations:    Discharge Exam: Filed Vitals:   01/04/14 0529  BP: 128/65  Pulse: 53  Temp: 98.3 F (36.8 C)  Resp: 18    General: NAd Cardiovascular: S1, S2 RRR Respiratory: cta b Extremities:  Right lower extremity still edematous, but improving. No erythema or warmth  Discharge Instructions  Discharge Orders   Future Appointments Provider Department Dept Phone   04/29/2014 10:00 AM Kerri Perches, MD [facility] [phone]   Patient should bring all necessary paperwork to be completed.  Arrive 15 minutes prior to the appointment.   Future Orders Complete By Expires   Call MD for:  redness, tenderness, or signs of infection (pain, swelling, redness, odor or green/yellow discharge around incision site)  As directed    Call MD for:  severe uncontrolled pain  As directed    Call MD for:  temperature >100.4  As directed    Diet - low sodium heart healthy  As directed    Face-to-face encounter (required for Medicare/Medicaid patients)  As directed    Comments:     I Yasmine Kilbourne certify that this patient is under my care and that I, or a nurse practitioner or physician's assistant working with me, had a face-to-face encounter that meets the physician face-to-face encounter requirements with this patient on 01/04/2014. The encounter with the patient was in whole, or in part for the following medical condition(s) which is the primary reason for home health care (List medical condition): admitted with cellulitis, would benefit from home health RN   Questions:     The encounter with the patient was in whole, or in part, for the following medical condition, which is the primary reason for home health  care:  cellulitis   I certify that, based on my findings, the following services are medically necessary home health services:  Nursing   My clinical findings support the need for the above services:  Unable to leave home safely without assistance and/or assistive device   Further, I certify that my clinical findings support that this patient is homebound due to:  Unable to leave home safely without assistance   Reason for Medically Necessary Home Health Services:  Skilled  Nursing- Skilled Assessment/Observation   Home Health  As directed    Questions:     To provide the following care/treatments:  RN   Increase activity slowly  As directed        Medication List         aspirin 81 MG tablet  Take 81 mg by mouth daily.     calcium-vitamin D 500-200 MG-UNIT Tabs  Commonly known as:  OSCAL WITH D  TAKE (1) TABLET BY MOUTH (3) TIMES DAILY WITH MEALS.     diltiazem 120 MG 24 hr capsule  Commonly known as:  CARDIZEM CD  TAKE ONE CAPSULE DAILY FOR BLOOD PRESSURE.     ferrous sulfate 325 (65 FE) MG EC tablet  Take 325 mg by mouth daily with breakfast.     FLUoxetine 20 MG capsule  Commonly known as:  PROZAC  TAKE 1 CAPSULE BY MOUTH ONCE A DAY.     levETIRAcetam 500 MG tablet  Commonly known as:  KEPPRA  TAKE 1 TABLET BY MOUTH TWICE A DAY AS DIRECTED.     lovastatin 20 MG tablet  Commonly known as:  MEVACOR  Take 1 tablet (20 mg total) by mouth at bedtime.     multivitamin tablet  Take 1 tablet by mouth daily.     sulfamethoxazole-trimethoprim 800-160 MG per tablet  Commonly known as:  BACTRIM DS  Take 1 tablet by mouth 2 (two) times daily.       No Known Allergies     Follow-up Information   Follow up with [facility].   Contact information:   [address] [place] [address] [phone]       Follow up with Syliva Overman, MD. Schedule an appointment as soon as possible for a visit in 1 week.   Specialty:  Family Medicine   Contact information:   [address], [address] [place] [address] [phone]        The results of significant diagnostics from this hospitalization (including imaging, microbiology, ancillary and laboratory) are listed below for reference.    Significant Diagnostic Studies: Dg Tibia/fibula Right  01/02/2014   CLINICAL DATA:  Right leg and foot swelling.  EXAM: RIGHT TIBIA AND FIBULA - 2 VIEW  COMPARISON:  None.  FINDINGS: No fracture. No bone lesion. Bones are  demineralized. Knee and ankle joints are normally aligned. There is diffuse subcutaneous soft tissue edema.  IMPRESSION: Diffuse soft tissue edema.  No fracture or bone lesion.   Electronically Signed   By: Amie Portland M.D.   On: 01/02/2014 16:06   [place] Venous Img Lower Unilateral Right  01/03/2014   CLINICAL DATA:  Right leg swelling and positive D-dimer.  EXAM: RIGHT LOWER EXTREMITY VENOUS DOPPLER ULTRASOUND  TECHNIQUE: Gray-scale sonography with graded compression, as well as color Doppler and duplex ultrasound, were performed to evaluate the deep venous system from the level of the common femoral vein through the popliteal and proximal calf veins. Spectral Doppler was utilized to  evaluate flow at rest and with distal augmentation maneuvers.  COMPARISON:  04/14/2012  FINDINGS: Thrombus within deep veins:  None visualized.  Normal compressibility, augmentation and color Doppler flow in the right common femoral vein, right femoral vein and right popliteal vein. The visualized right calf veins are patent. Visualized great saphenous vein is patent.  Other findings:  Right calf edema.  IMPRESSION: Negative for right lower extremity DVT.   Electronically Signed   By: Richarda OverlieAdam  Henn M.D.   On: 01/03/2014 10:27   Dg Foot Complete Right  01/02/2014   CLINICAL DATA:  Right leg and foot swelling.  EXAM: RIGHT FOOT COMPLETE - 3+ VIEW  COMPARISON:  None.  FINDINGS: No fracture. Joints are normally aligned. Bones are diffusely demineralized. There is a marginal erosion along the medial aspect of the distal great toe proximal phalanx. There are plantar and dorsal calcaneal spurs. Soft tissue edema is noted most evident in the ankle.  IMPRESSION: No fracture or dislocation.  No acute osseous abnormality.   Electronically Signed   By: Amie Portlandavid  Ormond M.D.   On: 01/02/2014 16:07    Microbiology: No results found for this or any previous visit (from the past 240 hour(s)).   Labs: Basic Metabolic Panel:  Recent Labs Lab  01/02/14 1500 01/03/14 0516 01/04/14 0454  NA 141 141 144  K 4.7 3.8 3.9  CL 100 102 105  CO2 32 32 32  GLUCOSE 90 104* 91  BUN 18 15 17   CREATININE 0.57 0.83 0.77  CALCIUM 9.1 8.8 8.2*   Liver Function Tests:  Recent Labs Lab 01/02/14 1500 01/03/14 0516  AST 34 28  ALT 24 22  ALKPHOS 87 69  BILITOT 0.3 0.5  PROT 7.2 6.5  ALBUMIN 3.5 3.0*   No results found for this basename: LIPASE, AMYLASE,  in the last 168 hours No results found for this basename: AMMONIA,  in the last 168 hours CBC:  Recent Labs Lab 01/02/14 1500 01/03/14 0516 01/04/14 0454  WBC 4.6 4.2 4.6  NEUTROABS 2.2  --   --   HGB 11.9* 11.1* 11.2*  HCT 36.1 33.6* 33.3*  MCV 92.1 93.6 92.8  PLT 153 135* 127*   Cardiac Enzymes: No results found for this basename: CKTOTAL, CKMB, CKMBINDEX, TROPONINI,  in the last 168 hours BNP: BNP (last 3 results)  Recent Labs  01/03/14 1052  PROBNP 398.2   CBG: No results found for this basename: GLUCAP,  in the last 168 hours     Signed:  Gennaro Lizotte  [facility] 01/04/2014, 7:30 PM

## 2014-01-04 NOTE — Progress Notes (Signed)
IV removed. Discharge instructions reviewed with patient. Will go over with grandson when he arrives. Patient ready for discharge home.

## 2014-01-12 ENCOUNTER — Encounter: Payer: Self-pay | Admitting: Family Medicine

## 2014-01-12 ENCOUNTER — Ambulatory Visit (INDEPENDENT_AMBULATORY_CARE_PROVIDER_SITE_OTHER): Payer: Medicare Other | Admitting: Family Medicine

## 2014-01-12 VITALS — BP 138/78 | HR 69 | Resp 16 | Wt 175.0 lb

## 2014-01-12 DIAGNOSIS — I10 Essential (primary) hypertension: Secondary | ICD-10-CM

## 2014-01-12 DIAGNOSIS — M7989 Other specified soft tissue disorders: Secondary | ICD-10-CM

## 2014-01-12 DIAGNOSIS — L03119 Cellulitis of unspecified part of limb: Secondary | ICD-10-CM

## 2014-01-12 DIAGNOSIS — Z9181 History of falling: Secondary | ICD-10-CM

## 2014-01-12 DIAGNOSIS — R569 Unspecified convulsions: Secondary | ICD-10-CM

## 2014-01-12 DIAGNOSIS — R296 Repeated falls: Secondary | ICD-10-CM

## 2014-01-12 DIAGNOSIS — F429 Obsessive-compulsive disorder, unspecified: Secondary | ICD-10-CM

## 2014-01-12 DIAGNOSIS — L02419 Cutaneous abscess of limb, unspecified: Secondary | ICD-10-CM

## 2014-01-12 DIAGNOSIS — E785 Hyperlipidemia, unspecified: Secondary | ICD-10-CM

## 2014-01-12 DIAGNOSIS — Z09 Encounter for follow-up examination after completed treatment for conditions other than malignant neoplasm: Secondary | ICD-10-CM

## 2014-01-12 MED ORDER — DILTIAZEM HCL ER COATED BEADS 120 MG PO CP24: ORAL_CAPSULE | ORAL | Status: DC

## 2014-01-12 MED ORDER — LOVASTATIN 20 MG PO TABS: 20.0000 mg | ORAL_TABLET | Freq: Every day | ORAL | Status: DC

## 2014-01-12 NOTE — Patient Instructions (Signed)
Annual wellness as before  No evidence of skin infection, keep legs elevated as much as you are able to.  Be careful not to fall  No changes in medication  Fall Prevention and Home Safety Falls cause injuries and can affect all age groups. It is possible to prevent falls.  HOW TO PREVENT FALLS  Wear shoes with rubber soles that do not have an opening for your toes.  Keep the inside and outside of your house well lit.  Use night lights throughout your home.  Remove clutter from floors.  Clean up floor spills.  Remove throw rugs or fasten them to the floor with carpet tape.  Do not place electrical cords across pathways.  Put grab bars by your tub, shower, and toilet. Do not use towel bars as grab bars.  Put handrails on both sides of the stairway. Fix loose handrails.  Do not climb on stools or stepladders, if possible.  Do not wax your floors.  Repair uneven or unsafe sidewalks, walkways, or stairs.  Keep items you use a lot within reach.  Be aware of pets.  Keep emergency numbers next to the telephone.  Put smoke detectors in your home and near bedrooms. Ask your doctor what other things you can do to prevent falls. Document Released: 07/21/2009 Document Revised: 03/25/2012 Document Reviewed: 12/25/2011 [facility] Patient Information 2014 [place], [place].

## 2014-01-14 DIAGNOSIS — L03119 Cellulitis of unspecified part of limb: Secondary | ICD-10-CM

## 2014-01-14 DIAGNOSIS — L02419 Cutaneous abscess of limb, unspecified: Secondary | ICD-10-CM

## 2014-01-14 DIAGNOSIS — R569 Unspecified convulsions: Secondary | ICD-10-CM

## 2014-01-14 DIAGNOSIS — I10 Essential (primary) hypertension: Secondary | ICD-10-CM

## 2014-01-14 DIAGNOSIS — G2581 Restless legs syndrome: Secondary | ICD-10-CM

## 2014-01-14 DIAGNOSIS — Z4800 Encounter for change or removal of nonsurgical wound dressing: Secondary | ICD-10-CM

## 2014-01-25 ENCOUNTER — Telehealth: Payer: Self-pay

## 2014-01-25 DIAGNOSIS — E785 Hyperlipidemia, unspecified: Secondary | ICD-10-CM

## 2014-01-25 DIAGNOSIS — I10 Essential (primary) hypertension: Secondary | ICD-10-CM

## 2014-01-26 NOTE — Telephone Encounter (Signed)
Noted and order mailed to patient's address with approx date.

## 2014-01-26 NOTE — Telephone Encounter (Signed)
Needs fasting lipid and hepatic

## 2014-01-26 NOTE — Telephone Encounter (Signed)
Please advise if any labs are needed before awv in July.  Last labs done in March

## 2014-01-26 NOTE — Addendum Note (Signed)
Addended by: Kandis FantasiaSLADE, Dashawna Delbridge B on: 01/26/2014 09:00 AM   Modules accepted: Orders

## 2014-02-15 IMAGING — CR DG OS CALCIS 2+V*R*
2 series · 2 of 2 positions shown · non-contrast
Comparison: None.

CLINICAL DATA: Right heel wound

RIGHT OS CALCIS - 2+ VIEW

[view not recorded (1 of 2)]
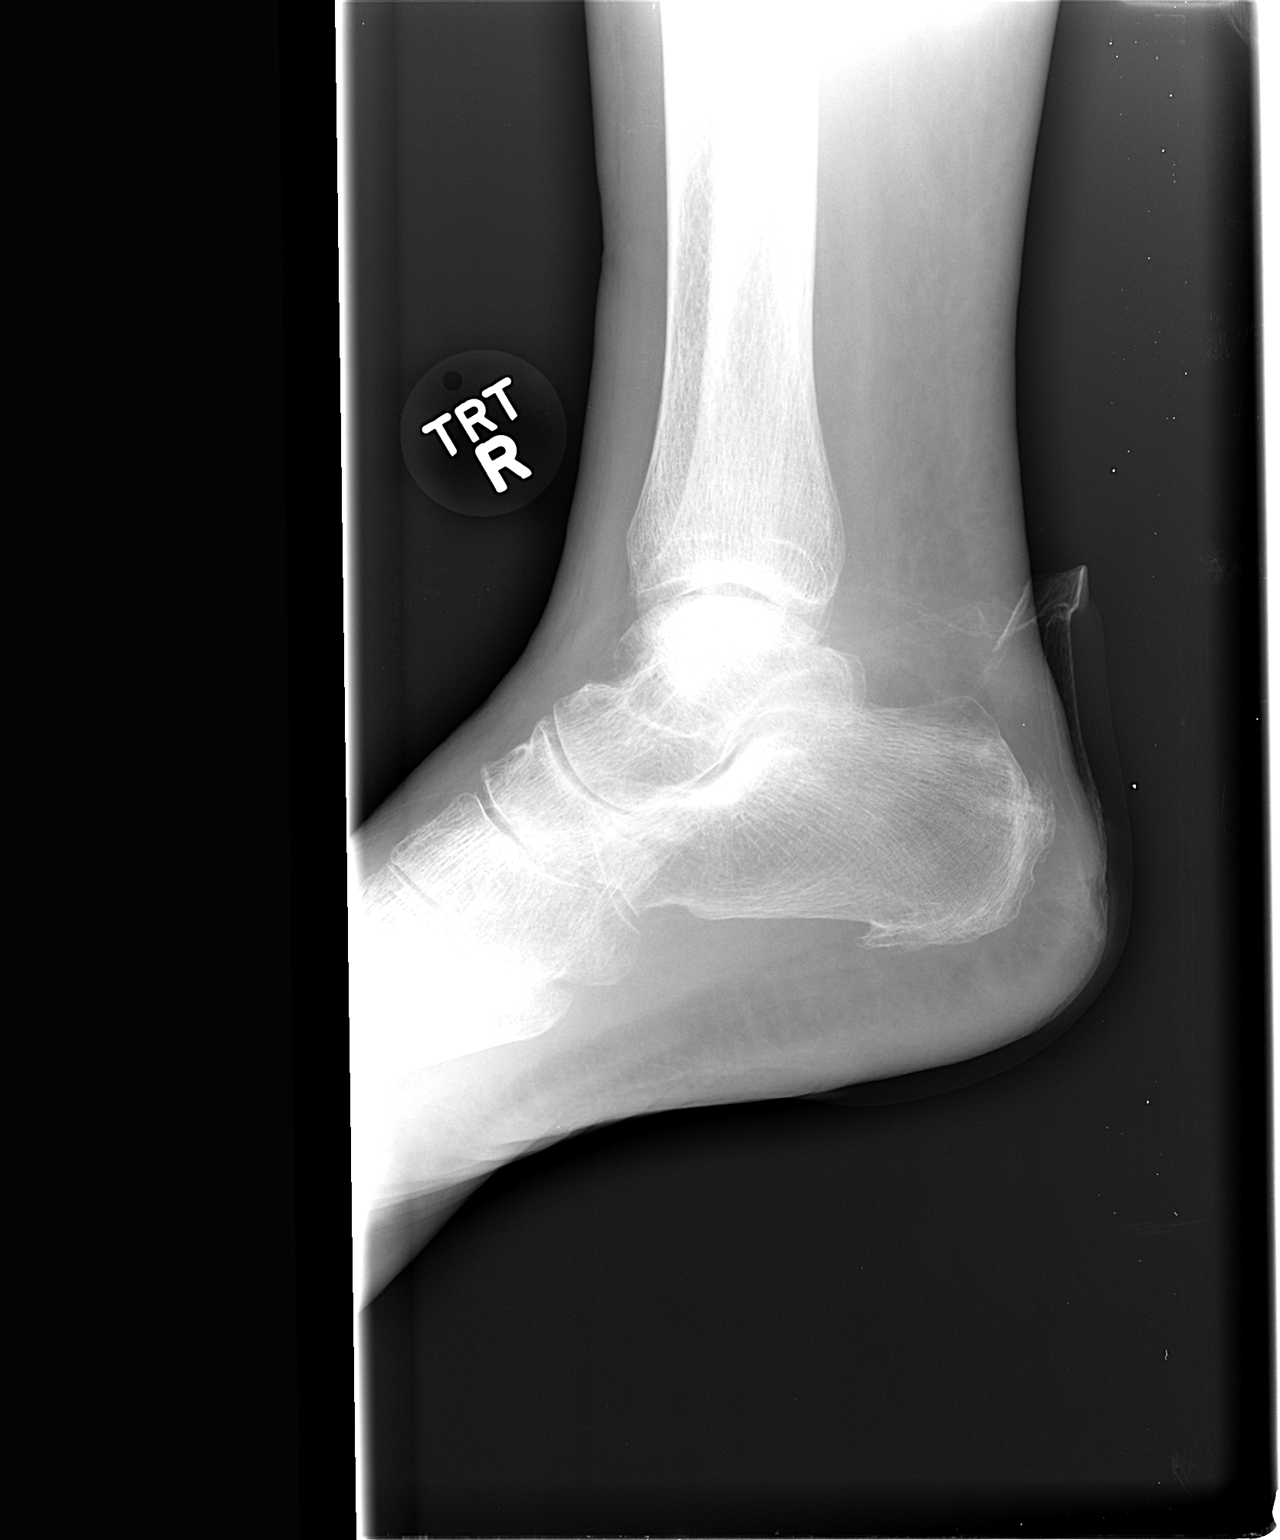

[view not recorded (2 of 2)]
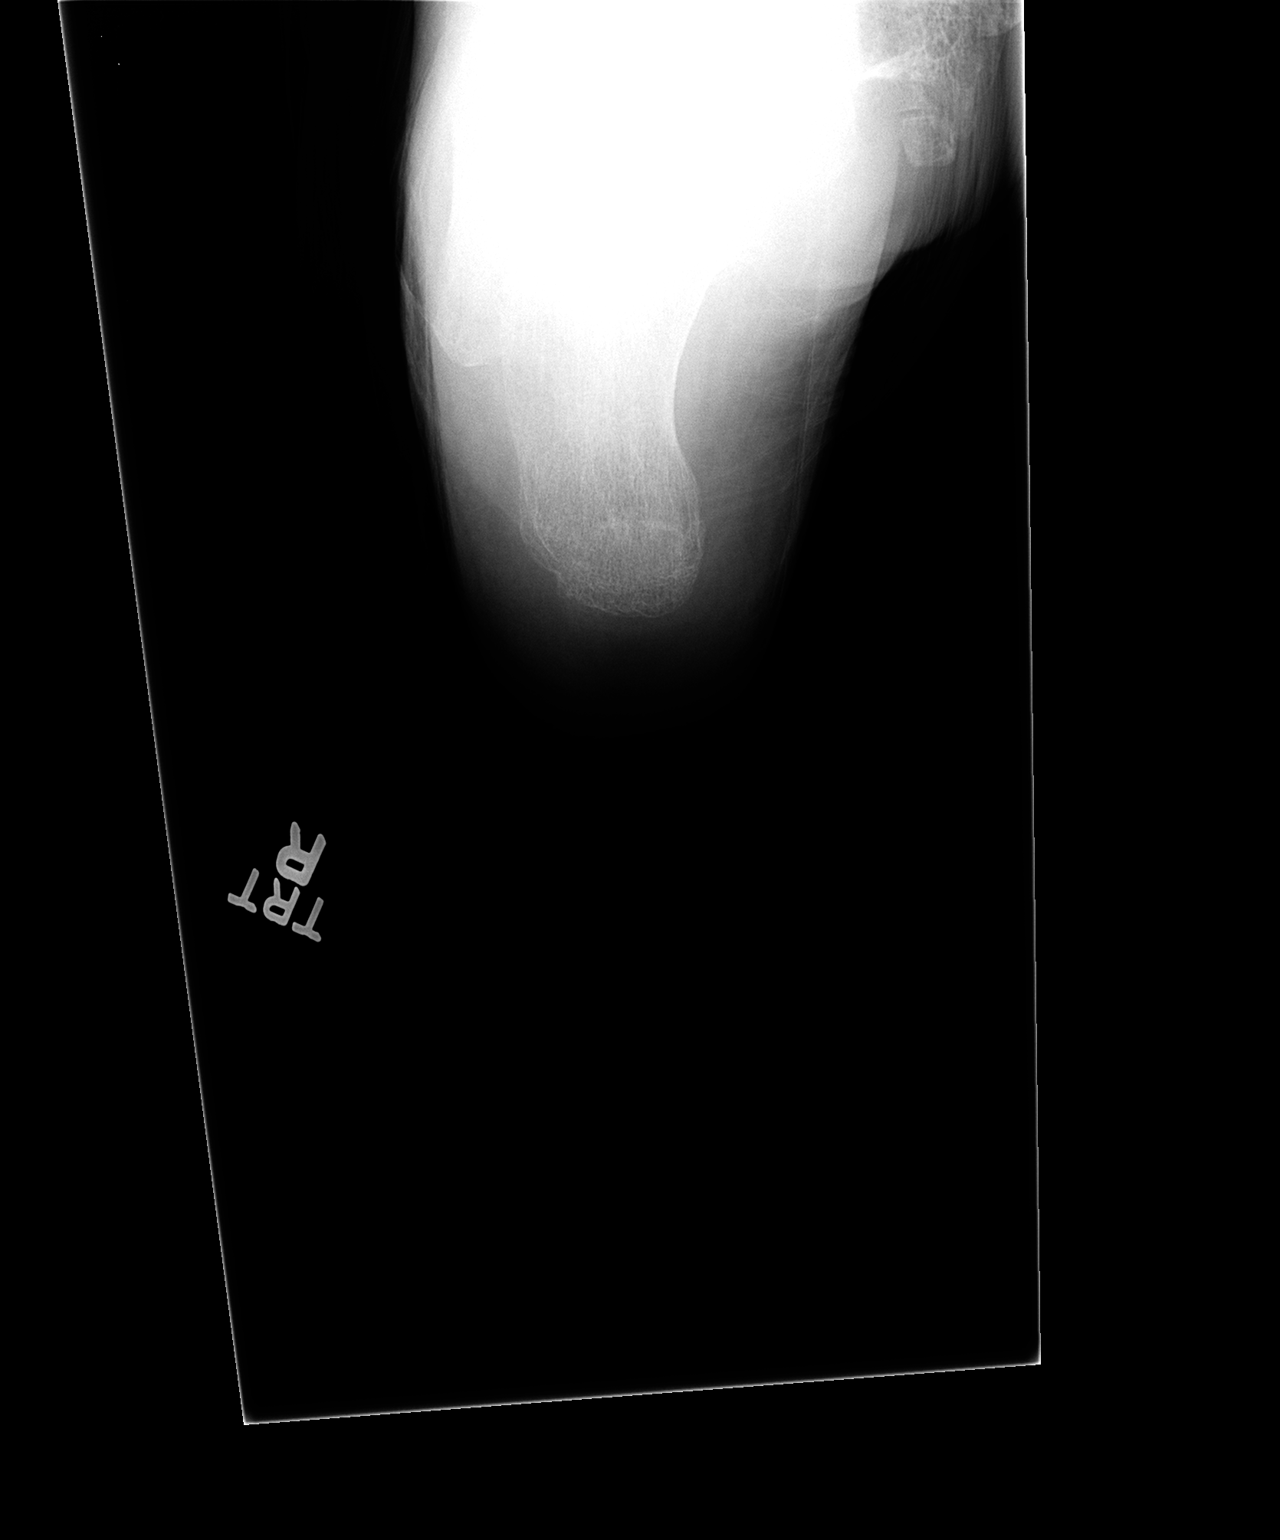

[2 of 2 positions shown; findings below may reference images not displayed]

FINDINGS: No acute fracture is identified.  Soft tissue defect is
seen although no definitive bony erosion to suggest osteomyelitis
is noted.
IMPRESSION: No definitive osteomyelitis.  A soft tissue defect is seen.  If
clinically indicated an MRI of the foot or three-phase bone scan
would be more sensitive for osteomyelitis.

## 2014-02-26 ENCOUNTER — Other Ambulatory Visit: Payer: Self-pay | Admitting: Family Medicine

## 2014-02-26 DIAGNOSIS — Z1231 Encounter for screening mammogram for malignant neoplasm of breast: Secondary | ICD-10-CM

## 2014-03-20 DIAGNOSIS — Z09 Encounter for follow-up examination after completed treatment for conditions other than malignant neoplasm: Secondary | ICD-10-CM | POA: Insufficient documentation

## 2014-03-20 NOTE — Assessment & Plan Note (Signed)
Controlled, no change in medication  

## 2014-03-20 NOTE — Progress Notes (Signed)
   Subjective:    Patient ID: Alejandra Cruz, female    DOB: Jun 24, 1933, 80 y.o.   MRN: [id number]  HPI Pt hospitalized from 3/28 to 3/30 with cellulitis of right leg, and is in for f/u The swelling and redness are much improved, though still present, she denies any associated pain , fever or chills. Just needs to keep leg elevated to assist healing. Acute illness developed after direct trauma to her shin at home Prior to this had been well   Review of Systems See HPI Denies recent fever or chills. Denies sinus pressure, nasal congestion, ear pain or sore throat. Denies chest congestion, productive cough or wheezing. Denies chest pains, palpitations and leg swelling Denies abdominal pain, nausea, vomiting,diarrhea or constipation.   Denies dysuria, frequency, hesitancy does have some  incontinence. C/o chronic  joint pain, swelling and limitation in mobility. Denies headaches, denies any recent  seizures, g. Denies uncontrolled  depression, anxiety or insomnia.       Objective:   Physical Exam BP 138/78  Pulse 69  Resp 16  Wt 175 lb (79.379 kg)  SpO2 100% Patient alert and oriented and in no cardiopulmonary distress.  HEENT: No facial asymmetry, EOMI,   oropharynx pink and moist.  Neck decreased though adequate ROM, no JVD, no mass.  Chest: Clear to auscultation bilaterally.  CVS: S1, S2 no murmurs, no S3.  ABD: Soft non tender.   Ext:  Bilateral  Edema, left greter than right and is one plus  MS: decreased e ROM spine, shoulders, hips and knees.  Skin: Intact,erythema noted on right leg max diameter approx 6 cm, non tender   Psych: Good eye contact, normal affect. Memory intact not anxious or depressed appearing.  CNS: CN 2-12 intact, power,  normal throughout.no focal deficits noted.    Assesment and Plan:    Cellulitis of leg Marked improvement in infections. Counseled to elevate affected foot, and to avoid trauma as much as [possible  Recurrent  falls High fall risk is established unfortunately Reduction in fall risk and safety in the home discussed , and literature  provided on the discharge instruction sheet as well.   OBSESSIVE-COMPULSIVE DISORDER Controlled, no change in medication   Hospital discharge follow-up Clinically improved as far as celluitis is concerned. Pt to complete entire antibiotc course prescribed a s out pt  SEIZURE DISORDER Controlled, no change in medication   HYPERLIPIDEMIA Controlled, no change in medication Hyperlipidemia:Low fat diet discussed and encouraged.  Updated lab needed at/ before next visit.   ESSENTIAL HYPERTENSION Controlled, no change in medication

## 2014-03-20 NOTE — Assessment & Plan Note (Signed)
Controlled, no change in medication Hyperlipidemia:Low fat diet discussed and encouraged.  Updated lab needed at/ before next visit.  

## 2014-03-20 NOTE — Assessment & Plan Note (Signed)
High fall risk is established unfortunately Reduction in fall risk and safety in the home discussed , and literature  provided on the discharge instruction sheet as well.

## 2014-03-20 NOTE — Assessment & Plan Note (Signed)
Marked improvement in infections. Counseled to elevate affected foot, and to avoid trauma as much as [possible

## 2014-03-20 NOTE — Assessment & Plan Note (Signed)
Clinically improved as far as celluitis is concerned. Pt to complete entire antibiotc course prescribed a s out pt

## 2014-04-24 ENCOUNTER — Other Ambulatory Visit: Payer: Self-pay | Admitting: Family Medicine

## 2014-04-24 LAB — LIPID PANEL
Cholesterol: 162 mg/dL (ref 0–200)
HDL: 56 mg/dL (ref >39)
LDL CALC: 100 mg/dL — AB (ref 0–99)
Total CHOL/HDL Ratio: 2.9 Ratio
Triglycerides: 31 mg/dL (ref <150)
VLDL: 6 mg/dL (ref 0–40)

## 2014-04-24 LAB — COMPREHENSIVE METABOLIC PANEL
ALBUMIN: 3.8 g/dL (ref 3.5–5.2)
ALK PHOS: 67 U/L (ref 39–117)
ALT: 11 U/L (ref 0–35)
AST: 20 U/L (ref 0–37)
BUN: 25 mg/dL — ABNORMAL HIGH (ref 6–23)
CO2: 30 mEq/L (ref 19–32)
Calcium: 9 mg/dL (ref 8.4–10.5)
Chloride: 105 mEq/L (ref 96–112)
Creat: 0.78 mg/dL (ref 0.50–1.10)
GLUCOSE: 89 mg/dL (ref 70–99)
POTASSIUM: 4.1 meq/L (ref 3.5–5.3)
Sodium: 143 mEq/L (ref 135–145)
TOTAL PROTEIN: 7.1 g/dL (ref 6.0–8.3)
Total Bilirubin: 0.5 mg/dL (ref 0.2–1.2)

## 2014-04-26 ENCOUNTER — Ambulatory Visit (HOSPITAL_COMMUNITY)
Admission: RE | Admit: 2014-04-26 | Discharge: 2014-04-26 | Disposition: A | Discharge status: Home / Self Care | Payer: Medicare Other | Source: Ambulatory Visit | Attending: Family Medicine | Admitting: Family Medicine

## 2014-04-26 DIAGNOSIS — Z1231 Encounter for screening mammogram for malignant neoplasm of breast: Secondary | ICD-10-CM | POA: Insufficient documentation

## 2014-04-29 ENCOUNTER — Ambulatory Visit (INDEPENDENT_AMBULATORY_CARE_PROVIDER_SITE_OTHER): Payer: Medicare Other | Admitting: Family Medicine

## 2014-04-29 ENCOUNTER — Encounter: Payer: Self-pay | Admitting: Family Medicine

## 2014-04-29 VITALS — BP 128/80 | HR 87 | Resp 16 | Wt 169.4 lb

## 2014-04-29 DIAGNOSIS — H547 Unspecified visual loss: Secondary | ICD-10-CM | POA: Insufficient documentation

## 2014-04-29 DIAGNOSIS — Z0000 Encounter for general adult medical examination without abnormal findings: Secondary | ICD-10-CM | POA: Insufficient documentation

## 2014-04-29 DIAGNOSIS — Z9181 History of falling: Secondary | ICD-10-CM

## 2014-04-29 DIAGNOSIS — Z23 Encounter for immunization: Secondary | ICD-10-CM

## 2014-04-29 DIAGNOSIS — F3289 Other specified depressive episodes: Secondary | ICD-10-CM

## 2014-04-29 DIAGNOSIS — R296 Repeated falls: Secondary | ICD-10-CM

## 2014-04-29 DIAGNOSIS — F329 Major depressive disorder, single episode, unspecified: Secondary | ICD-10-CM

## 2014-04-29 MED ORDER — FLUOXETINE HCL 20 MG PO CAPS: ORAL_CAPSULE | ORAL | Status: DC

## 2014-04-29 MED ORDER — LOVASTATIN 20 MG PO TABS: 20.0000 mg | ORAL_TABLET | Freq: Every day | ORAL | Status: DC

## 2014-04-29 MED ORDER — LEVETIRACETAM 500 MG PO TABS: ORAL_TABLET | ORAL | Status: DC

## 2014-04-29 MED ORDER — DILTIAZEM HCL ER COATED BEADS 120 MG PO CP24: ORAL_CAPSULE | ORAL | Status: DC

## 2014-04-29 MED ORDER — MIRTAZAPINE 7.5 MG PO TABS: 7.5000 mg | ORAL_TABLET | Freq: Every day | ORAL | Status: DC

## 2014-04-29 NOTE — Assessment & Plan Note (Signed)
prenvar administered at visit

## 2014-04-29 NOTE — Assessment & Plan Note (Signed)
Pt not suicidal or homicidal, remeron added for bedtime use , does have main c/o poor sleep also, she is to continue fluoxetine which she has been maintained on for several years

## 2014-04-29 NOTE — Assessment & Plan Note (Signed)
Significant impairment in vision , referral made to opthalmologist

## 2014-04-29 NOTE — Assessment & Plan Note (Signed)
Home safety discussed at visit, pt has h/o multiple falls

## 2014-04-29 NOTE — Patient Instructions (Addendum)
F/u early November, call if you need me before  Prevnar today  New medication to help with sleep and depression, remeron start taking at bedtime   PLEASE ask your family to help you to make sure that you DO take all of the medicationms that are prescribed on a regular basis  Blood work and blood pressure are excellent  You are referred for eye exam to Dr Nile RiggsShapiro

## 2014-04-29 NOTE — Progress Notes (Signed)
Subjective:    Patient ID: Alejandra Cruz, female    DOB: 21-Jan-1933, 80 y.o.   MRN: [id number]  HPI  Preventive Screening-Counseling & Management   Patient present here today for a Medicare annual wellness visit.   Current Problems (verified)   Medications Prior to Visit Allergies (verified)   PAST HISTORY  Family History-Verified  Social History Divorced and retired. Has 4 children. Currently lives alone. Son sometimes stays at night.     Risk Factors  Current exercise habits: Walks a lot during the day while at home but no other activity  Dietary issues discussed: Eats a lot of fruits (mainly grapes) Eats from meals on wheels. Rarely eats fried foods.    Cardiac risk factors: HTN, Hyperlipidemia   Depression Screen  (Note: if answer to either of the following is "Yes", a more complete depression screening is indicated)   Over the past two weeks, have you felt down, depressed or hopeless? Yes, feels depressed a lot   Over the past two weeks, have you felt little interest or pleasure in doing things? No  Have you lost interest or pleasure in daily life? No  Do you often feel hopeless? No  Do you cry easily over simple problems? Yes, states she cries a lot and she doesn't sleep at night maybe 2-3 hours   Activities of Daily Living  In your present state of health, do you have any difficulty performing the following activities?  Driving?: Doesn't drive anymore Managing money?: children supervise bill payment Feeding yourself?:No Getting from bed to chair?:No Climbing a flight of stairs?: Tries to avoid steps. Can do a few steps at a time if she has a railing to hold to  Preparing food and eating?:No Bathing or showering?:yes- family members assist her  Getting dressed?:No Getting to the toilet?:No Using the toilet?:No Moving around from place to place?: No  Fall Risk Assessment In the past year have you fallen or had a near fall?: 1 fall but there was no  injury Are you currently taking any medications that make you dizzy?:No   Hearing Difficulties: No Do you often ask people to speak up or repeat themselves?:yes- refuses to get hearing aids Do you experience ringing or noises in your ears?:No Do you have difficulty understanding soft or whispered voices?:yes  Cognitive Testing  Alert? Yes Normal Appearance?Yes  Oriented to person? Yes Place? Yes  Time? Yes  Displays appropriate judgment?Yes  Can read the correct time from a watch face? yes Are you having problems remembering things? Yes. States she has a hard time with this. MMSE today is 28, which is excellent  Advanced Directives have been discussed with the patient?Yes, full code, but this needs to be discussed with children present, pt became agitated when discussion was started   List the Names of Other Physician/Practitioners you currently use:  Dr Gerilyn Pilgrimoonquah (Neurologist) Dr Romeo AppleHarrison (Ortho) Dr Tanda RockersNichols (Wound clinic)   Indicate any recent Medical Services you may have received from other than [facility] providers in the past year (date may be approximate).   Assessment:    Annual Wellness Exam   Plan:    DEPRESSION Pt not suicidal or homicidal, remeron added for bedtime use , does have main c/o poor sleep also, she is to continue fluoxetine which she has been maintained on for several years  Reduced vision Significant impairment in vision , referral made to opthalmologist  Recurrent falls Home safety discussed at visit, pt has h/o multiple falls  Medicare annual  wellness visit, subsequent Annual exam as documented. Counseling done  re healthy lifestyle involving commitment to daily physical activity as able, espescialy chair exercises, heart healthy diet, and maintaining healthy weight.The importance of adequate sleep also discussed. Regular seat belt use is also discussed.  Immunization and cancer screening needs are specifically addressed at this visit.   Need for  pneumococcal vaccine prenvar administered at visit      Patient Instructions (the written plan) was given to the patient.  Medicare Attestation  I have personally reviewed:  The patient's medical and social history  Their use of alcohol, tobacco or illicit drugs  Their current medications and supplements  The patient's functional ability including ADLs,fall risks, home safety risks, cognitive, and hearing and visual impairment  Diet and physical activities  Evidence for depression or mood disorders  The patient's weight, height, BMI, and visual acuity have been recorded in the chart. I have made referrals, counseling, and provided education to the patient based on review of the above and I have provided the patient with a written personalized care plan for preventive services.     Review of Systems     Objective:   Physical Exam        Assessment & Plan:

## 2014-04-29 NOTE — Assessment & Plan Note (Signed)
Annual exam as documented. Counseling done  re healthy lifestyle involving commitment to daily physical activity as able, espescialy chair exercises, heart healthy diet, and maintaining healthy weight.The importance of adequate sleep also discussed. Regular seat belt use is also discussed.  Immunization and cancer screening needs are specifically addressed at this visit.

## 2014-08-18 ENCOUNTER — Ambulatory Visit (INDEPENDENT_AMBULATORY_CARE_PROVIDER_SITE_OTHER): Payer: Medicare Other

## 2014-08-18 ENCOUNTER — Encounter (INDEPENDENT_AMBULATORY_CARE_PROVIDER_SITE_OTHER): Payer: Self-pay

## 2014-08-18 ENCOUNTER — Encounter: Payer: Self-pay | Admitting: Family Medicine

## 2014-08-18 ENCOUNTER — Ambulatory Visit (INDEPENDENT_AMBULATORY_CARE_PROVIDER_SITE_OTHER): Payer: Medicare Other | Admitting: Family Medicine

## 2014-08-18 VITALS — BP 130/80 | HR 93 | Resp 16 | Ht 67.0 in | Wt 170.4 lb

## 2014-08-18 DIAGNOSIS — F42 Obsessive-compulsive disorder: Secondary | ICD-10-CM

## 2014-08-18 DIAGNOSIS — Z23 Encounter for immunization: Secondary | ICD-10-CM

## 2014-08-18 DIAGNOSIS — E559 Vitamin D deficiency, unspecified: Secondary | ICD-10-CM

## 2014-08-18 DIAGNOSIS — R296 Repeated falls: Secondary | ICD-10-CM

## 2014-08-18 DIAGNOSIS — F418 Other specified anxiety disorders: Secondary | ICD-10-CM

## 2014-08-18 DIAGNOSIS — F429 Obsessive-compulsive disorder, unspecified: Secondary | ICD-10-CM

## 2014-08-18 DIAGNOSIS — R569 Unspecified convulsions: Secondary | ICD-10-CM

## 2014-08-18 DIAGNOSIS — E785 Hyperlipidemia, unspecified: Secondary | ICD-10-CM

## 2014-08-18 DIAGNOSIS — I10 Essential (primary) hypertension: Secondary | ICD-10-CM

## 2014-08-18 MED ORDER — ALENDRONATE SODIUM 70 MG PO TABS: 70.0000 mg | ORAL_TABLET | ORAL | Status: DC

## 2014-08-18 NOTE — Patient Instructions (Signed)
F/u early Feb , call if you need me before  Flu vaccine today   Fasting lipid, cmp , TSH, CBC, vit DJan 30 or after     Be careful not to fall  New for  Bone health is once weekly fosamax

## 2014-08-18 NOTE — Progress Notes (Signed)
   Subjective:    Patient ID: Alejandra Cruz, female    DOB: [birth year], 80 y.o.   MRN: [id number]  HPI The PT is here for follow up and re-evaluation of chronic medical conditions, medication management and review of any available recent lab and radiology data.  Preventive health is updated, specifically   Immunization.   Questions or concerns regarding consultations or procedures which the PT has had in the interim are  addressed. The PT denies any adverse reactions to current medications since the last visit.  Has fallen twice i past 4 months, and continues to take trash out despite multiple attempts to have her stop   Review of Systems See HPI Denies recent fever or chills. Denies sinus pressure, nasal congestion, ear pain or sore throat. Denies chest congestion, productive cough or wheezing. Denies chest pains, palpitations and leg swelling Denies abdominal pain, nausea, vomiting,diarrhea or constipation.   Denies dysuria, frequency, hesitancy or incontinence.  Denies uncontrolled  headaches, seizures, numbness, or tingling. Denies uncontrolled  depression, anxiety or insomnia.Was afraid to take medication for sleep , so she never did Denies skin break down or rash.        Objective:   Physical Exam BP 130/80 mmHg  Pulse 93  Resp 16  Ht 5\' 7"  (1.702 m)  Wt 170 lb 6.4 oz (77.293 kg)  BMI 26.68 kg/m2  SpO2 99% Patient alert and oriented and in no cardiopulmonary distress.  HEENT: No facial asymmetry, EOMI,   oropharynx pink and moist.  Neck decreased ROM, no JVD, no mass.  Chest: Clear to auscultation bilaterally.  CVS: S1, S2 no murmurs, no S3.Regular rate.  ABD: Soft non tender.   Ext: No edema  MS: decreased ROM spine, shoulders, hips and knees.  Skin: Intact, no ulcerations or rash noted.  Psych: Good eye contact, normal affect. Memory intact not anxious or depressed appearing.  CNS: CN 2-12 intact, power,  normal throughout.no focal deficits  noted.        Assessment & Plan:  Essential hypertension Controlled, no change in medication   Depression with anxiety Controlled, no change in medication   Obsessive-compulsive disorder Uncontrolled due to eprsistent potentially harmful behavior repeatedly goes ot taking trash with recurrent falls, pt aware that she will need placement in the future if this behavior continues  Recurrent falls Has had at least 2  Falls in past 4 months, both could have been pevented, if pt was compliant with safe behavior guidelines, she is to star weekly fosamax, has had a hip fracture, will see if tolerant, has reported past intolerance, daughter who is with her will supervise/ monitor  med fpor tolerance  Convulsions Controlled on current med, no seizure activity witnessed for over 1 year Continue management through neurology  Need for prophylactic vaccination and inoculation against influenza Vaccine administered at visit.

## 2014-08-21 DIAGNOSIS — Z23 Encounter for immunization: Secondary | ICD-10-CM | POA: Insufficient documentation

## 2014-08-21 NOTE — Assessment & Plan Note (Signed)
Controlled on current med, no seizure activity witnessed for over 1 year Continue management through neurology

## 2014-08-21 NOTE — Assessment & Plan Note (Addendum)
Has had at least 2  Falls in past 4 months, both could have been pevented, if pt was compliant with safe behavior guidelines, she is to star weekly fosamax, has had a hip fracture, will see if tolerant, has reported past intolerance, daughter who is with her will supervise/ monitor  med fpor tolerance

## 2014-08-21 NOTE — Assessment & Plan Note (Signed)
Uncontrolled due to eprsistent potentially harmful behavior repeatedly goes ot taking trash with recurrent falls, pt aware that she will need placement in the future if this behavior continues

## 2014-08-21 NOTE — Assessment & Plan Note (Signed)
Vaccine administered at visit.  

## 2014-08-21 NOTE — Assessment & Plan Note (Signed)
Controlled, no change in medication  

## 2014-09-01 ENCOUNTER — Ambulatory Visit: Payer: Medicare Other | Admitting: Family Medicine

## 2014-09-07 ENCOUNTER — Telehealth: Payer: Self-pay | Admitting: *Deleted

## 2014-09-07 NOTE — Telephone Encounter (Signed)
Pt called about a RX. Please advise

## 2014-09-08 NOTE — Telephone Encounter (Signed)
Wants an rx for rolling walker sent to [place].

## 2014-09-08 NOTE — Telephone Encounter (Signed)
Script signed, pls plxs fax and let her know

## 2014-09-13 ENCOUNTER — Telehealth: Payer: Self-pay

## 2014-09-13 NOTE — Telephone Encounter (Signed)
Clarified order for walker and resent

## 2014-11-09 LAB — COMPREHENSIVE METABOLIC PANEL
ALBUMIN: 3.9 g/dL (ref 3.5–5.2)
ALT: 14 U/L (ref 0–35)
AST: 21 U/L (ref 0–37)
Alkaline Phosphatase: 72 U/L (ref 39–117)
BUN: 27 mg/dL — ABNORMAL HIGH (ref 6–23)
CHLORIDE: 105 meq/L (ref 96–112)
CO2: 32 mEq/L (ref 19–32)
CREATININE: 0.61 mg/dL (ref 0.50–1.10)
Calcium: 9.4 mg/dL (ref 8.4–10.5)
GLUCOSE: 83 mg/dL (ref 70–99)
POTASSIUM: 3.8 meq/L (ref 3.5–5.3)
SODIUM: 145 meq/L (ref 135–145)
Total Bilirubin: 0.5 mg/dL (ref 0.2–1.2)
Total Protein: 7 g/dL (ref 6.0–8.3)

## 2014-11-09 LAB — LIPID PANEL
Cholesterol: 153 mg/dL (ref 0–200)
HDL: 62 mg/dL (ref >39)
LDL Cholesterol: 86 mg/dL (ref 0–99)
Total CHOL/HDL Ratio: 2.5 Ratio
Triglycerides: 25 mg/dL (ref <150)
VLDL: 5 mg/dL (ref 0–40)

## 2014-11-09 LAB — CBC
HCT: 38.7 % (ref 36.0–46.0)
Hemoglobin: 12.7 g/dL (ref 12.0–15.0)
MCH: 30.3 pg (ref 26.0–34.0)
MCHC: 32.8 g/dL (ref 30.0–36.0)
MCV: 92.4 fL (ref 78.0–100.0)
Platelets: 180 10*3/uL (ref 150–400)
RBC: 4.19 MIL/uL (ref 3.87–5.11)
RDW: 14.3 % (ref 11.5–15.5)
WBC: 5 10*3/uL (ref 4.0–10.5)

## 2014-11-09 LAB — VITAMIN D 25 HYDROXY (VIT D DEFICIENCY, FRACTURES): Vit D, 25-Hydroxy: 53 ng/mL (ref 30–100)

## 2014-11-09 LAB — TSH: TSH: 0.899 u[IU]/mL (ref 0.350–4.500)

## 2014-11-12 ENCOUNTER — Ambulatory Visit (INDEPENDENT_AMBULATORY_CARE_PROVIDER_SITE_OTHER): Payer: Medicare Other | Admitting: Family Medicine

## 2014-11-12 ENCOUNTER — Encounter: Payer: Self-pay | Admitting: Family Medicine

## 2014-11-12 ENCOUNTER — Other Ambulatory Visit: Payer: Self-pay | Admitting: Family Medicine

## 2014-11-12 VITALS — BP 128/76 | HR 76 | Resp 18 | Ht 67.0 in | Wt 174.1 lb

## 2014-11-12 DIAGNOSIS — R569 Unspecified convulsions: Secondary | ICD-10-CM

## 2014-11-12 DIAGNOSIS — F418 Other specified anxiety disorders: Secondary | ICD-10-CM

## 2014-11-12 DIAGNOSIS — F429 Obsessive-compulsive disorder, unspecified: Secondary | ICD-10-CM

## 2014-11-12 DIAGNOSIS — Z9181 History of falling: Secondary | ICD-10-CM | POA: Insufficient documentation

## 2014-11-12 DIAGNOSIS — E785 Hyperlipidemia, unspecified: Secondary | ICD-10-CM

## 2014-11-12 DIAGNOSIS — R296 Repeated falls: Secondary | ICD-10-CM

## 2014-11-12 DIAGNOSIS — R2681 Unsteadiness on feet: Secondary | ICD-10-CM

## 2014-11-12 DIAGNOSIS — F42 Obsessive-compulsive disorder: Secondary | ICD-10-CM

## 2014-11-12 DIAGNOSIS — I10 Essential (primary) hypertension: Secondary | ICD-10-CM

## 2014-11-12 MED ORDER — DILTIAZEM HCL ER COATED BEADS 120 MG PO CP24
ORAL_CAPSULE | ORAL | Status: DC
Start: 2014-11-12 — End: 2015-08-31

## 2014-11-12 MED ORDER — ALENDRONATE SODIUM 70 MG PO TABS: 70.0000 mg | ORAL_TABLET | ORAL | Status: DC

## 2014-11-12 NOTE — Patient Instructions (Signed)
Annual physical exam in 4.5 month, call if you need me before  Excellent labs and exam   No changes in medication  You are referred to PT to help with balance and safety  PLEASE get ZOSTAVAX at your pharmacy if able

## 2014-11-12 NOTE — Progress Notes (Signed)
   Subjective:    Patient ID: Alejandra Cruz, female    DOB: 12/04/1932, 81 y.o.   MRN: [id number]  HPI  The PT is here for follow up and re-evaluation of chronic medical conditions, medication management and review of any available recent lab and radiology data.  Preventive health is updated, specifically  Immunization.Needs zostavax   Questions or concerns regarding consultations or procedures which the PT has had in the interim are  addressed. The PT denies any adverse reactions to current medications since the last visit.  There are no new concerns.  There are no specific complaints      Review of Systems See HPI Denies recent fever or chills. Denies sinus pressure, nasal congestion, ear pain or sore throat. Denies chest congestion, productive cough or wheezing. Denies chest pains, palpitations and leg swelling Denies abdominal pain, nausea, vomiting,diarrhea or constipation.   Denies dysuria, frequency, hesitancy or incontinence. C/o  limitation in mobility.Relies on walker for safe ambulation Denies headaches, seizures, numbness, or tingling. Denies depression, anxiety or insomnia. Denies skin break down or rash.        Objective:   Physical Exam BP 128/76 mmHg  Pulse 76  Resp 18  Ht 5\' 7"  (1.702 m)  Wt 174 lb 1.9 oz (78.98 kg)  BMI 27.26 kg/m2  SpO2 100% Patient alert and oriented and in no cardiopulmonary distress.  HEENT: No facial asymmetry, EOMI,   oropharynx pink and moist.  Neck supple no JVD, no mass.  Chest: Clear to auscultation bilaterally.  CVS: S1, S2 no murmurs, no S3.Regular rate.  ABD: Soft non tender.   Ext: No edema  AO:ZHYQMVHQIS:Decreased  ROM spine, shoulders, hips and knees.  Skin: Intact, no ulcerations or rash noted.  Psych: Good eye contact, normal affect. Memory intact not anxious or depressed appearing.  CNS: CN 2-12 intact, power,  normal throughout.no focal deficits noted.        Assessment & Plan:  Depression with  anxiety Controlled, no change in medication    Hyperlipemia Controlled, no change in medication Hyperlipidemia:Low fat diet discussed and encouraged.     Obsessive-compulsive disorder Controlled, no change in medication    Essential hypertension Controlled, no change in medication    Convulsions Controlled, no change in medication Followed by neurology   Recurrent falls Refer for out pt PT for 6 weeks to improve gait and reduce fall risk Had one fall in the bathroom since past 5 weeks Home safety reviewed

## 2014-11-13 NOTE — Assessment & Plan Note (Signed)
Controlled, no change in medication  

## 2014-11-13 NOTE — Assessment & Plan Note (Signed)
Controlled, no change in medication Followed by neurology 

## 2014-11-13 NOTE — Assessment & Plan Note (Signed)
Controlled, no change in medication Hyperlipidemia:Low fat diet discussed and encouraged.  \ 

## 2014-11-13 NOTE — Assessment & Plan Note (Addendum)
Refer for out pt PT for 6 weeks to improve gait and reduce fall risk Had one fall in the bathroom since past 5 weeks Home safety reviewed

## 2014-11-18 ENCOUNTER — Encounter (HOSPITAL_COMMUNITY): Payer: Self-pay | Admitting: Emergency Medicine

## 2014-11-18 ENCOUNTER — Inpatient Hospital Stay (HOSPITAL_COMMUNITY)
Admission: EM | Admit: 2014-11-18 | Discharge: 2014-11-22 | DRG: 470 | Disposition: A | Discharge status: Skilled Nursing Facility | Payer: Medicare Other | Attending: Internal Medicine | Admitting: Internal Medicine

## 2014-11-18 ENCOUNTER — Emergency Department (HOSPITAL_COMMUNITY): Payer: Medicare Other

## 2014-11-18 DIAGNOSIS — R3129 Other microscopic hematuria: Secondary | ICD-10-CM | POA: Diagnosis present

## 2014-11-18 DIAGNOSIS — R312 Other microscopic hematuria: Secondary | ICD-10-CM | POA: Diagnosis present

## 2014-11-18 DIAGNOSIS — Z09 Encounter for follow-up examination after completed treatment for conditions other than malignant neoplasm: Secondary | ICD-10-CM

## 2014-11-18 DIAGNOSIS — G2581 Restless legs syndrome: Secondary | ICD-10-CM | POA: Diagnosis present

## 2014-11-18 DIAGNOSIS — K219 Gastro-esophageal reflux disease without esophagitis: Secondary | ICD-10-CM | POA: Diagnosis present

## 2014-11-18 DIAGNOSIS — Y92092 Bedroom in other non-institutional residence as the place of occurrence of the external cause: Secondary | ICD-10-CM

## 2014-11-18 DIAGNOSIS — E785 Hyperlipidemia, unspecified: Secondary | ICD-10-CM | POA: Diagnosis present

## 2014-11-18 DIAGNOSIS — I10 Essential (primary) hypertension: Secondary | ICD-10-CM | POA: Diagnosis present

## 2014-11-18 DIAGNOSIS — Z8052 Family history of malignant neoplasm of bladder: Secondary | ICD-10-CM | POA: Diagnosis not present

## 2014-11-18 DIAGNOSIS — Z825 Family history of asthma and other chronic lower respiratory diseases: Secondary | ICD-10-CM | POA: Diagnosis not present

## 2014-11-18 DIAGNOSIS — W010XXA Fall on same level from slipping, tripping and stumbling without subsequent striking against object, initial encounter: Secondary | ICD-10-CM | POA: Diagnosis present

## 2014-11-18 DIAGNOSIS — Z90710 Acquired absence of both cervix and uterus: Secondary | ICD-10-CM

## 2014-11-18 DIAGNOSIS — Z833 Family history of diabetes mellitus: Secondary | ICD-10-CM | POA: Diagnosis not present

## 2014-11-18 DIAGNOSIS — D72829 Elevated white blood cell count, unspecified: Secondary | ICD-10-CM

## 2014-11-18 DIAGNOSIS — S72002A Fracture of unspecified part of neck of left femur, initial encounter for closed fracture: Principal | ICD-10-CM | POA: Diagnosis present

## 2014-11-18 DIAGNOSIS — M1712 Unilateral primary osteoarthritis, left knee: Secondary | ICD-10-CM | POA: Diagnosis present

## 2014-11-18 DIAGNOSIS — G40909 Epilepsy, unspecified, not intractable, without status epilepticus: Secondary | ICD-10-CM

## 2014-11-18 DIAGNOSIS — F418 Other specified anxiety disorders: Secondary | ICD-10-CM | POA: Diagnosis present

## 2014-11-18 DIAGNOSIS — S72009A Fracture of unspecified part of neck of unspecified femur, initial encounter for closed fracture: Secondary | ICD-10-CM | POA: Diagnosis present

## 2014-11-18 DIAGNOSIS — W19XXXA Unspecified fall, initial encounter: Secondary | ICD-10-CM

## 2014-11-18 DIAGNOSIS — M25552 Pain in left hip: Secondary | ICD-10-CM | POA: Diagnosis present

## 2014-11-18 DIAGNOSIS — Z01818 Encounter for other preprocedural examination: Secondary | ICD-10-CM

## 2014-11-18 HISTORY — DX: Fracture of unspecified part of neck of left femur, initial encounter for closed fracture: S72.002A

## 2014-11-18 LAB — BASIC METABOLIC PANEL
Anion gap: 3 — ABNORMAL LOW (ref 5–15)
BUN: 17 mg/dL (ref 6–23)
CHLORIDE: 104 mmol/L (ref 96–112)
CO2: 32 mmol/L (ref 19–32)
Calcium: 8.4 mg/dL (ref 8.4–10.5)
Creatinine, Ser: 0.6 mg/dL (ref 0.50–1.10)
GFR calc non Af Amer: 84 mL/min — ABNORMAL LOW (ref >90)
Glucose, Bld: 99 mg/dL (ref 70–99)
Potassium: 4.2 mmol/L (ref 3.5–5.1)
Sodium: 139 mmol/L (ref 135–145)

## 2014-11-18 LAB — CBC WITH DIFFERENTIAL/PLATELET
BASOS PCT: 0 % (ref 0–1)
Basophils Absolute: 0 10*3/uL (ref 0.0–0.1)
Eosinophils Absolute: 0 10*3/uL (ref 0.0–0.7)
Eosinophils Relative: 0 % (ref 0–5)
HCT: 37.8 % (ref 36.0–46.0)
Hemoglobin: 12.3 g/dL (ref 12.0–15.0)
Lymphocytes Relative: 14 % (ref 12–46)
Lymphs Abs: 0.9 10*3/uL (ref 0.7–4.0)
MCH: 30.8 pg (ref 26.0–34.0)
MCHC: 32.5 g/dL (ref 30.0–36.0)
MCV: 94.5 fL (ref 78.0–100.0)
MONO ABS: 0.5 10*3/uL (ref 0.1–1.0)
Monocytes Relative: 8 % (ref 3–12)
NEUTROS PCT: 78 % — AB (ref 43–77)
Neutro Abs: 5.1 10*3/uL (ref 1.7–7.7)
Platelets: 168 10*3/uL (ref 150–400)
RBC: 4 MIL/uL (ref 3.87–5.11)
RDW: 14.4 % (ref 11.5–15.5)
WBC: 6.6 10*3/uL (ref 4.0–10.5)

## 2014-11-18 LAB — URINALYSIS, ROUTINE W REFLEX MICROSCOPIC
Bilirubin Urine: NEGATIVE
Glucose, UA: NEGATIVE mg/dL
KETONES UR: NEGATIVE mg/dL
LEUKOCYTES UA: NEGATIVE
Nitrite: NEGATIVE
Protein, ur: NEGATIVE mg/dL
Specific Gravity, Urine: 1.015 (ref 1.005–1.030)
Urobilinogen, UA: 0.2 mg/dL (ref 0.0–1.0)
pH: 7.5 (ref 5.0–8.0)

## 2014-11-18 LAB — PROTIME-INR
INR: 1.06 (ref 0.00–1.49)
Prothrombin Time: 14 seconds (ref 11.6–15.2)

## 2014-11-18 LAB — URINE MICROSCOPIC-ADD ON

## 2014-11-18 MED ORDER — ONDANSETRON HCL 4 MG/2ML IJ SOLN
4.0000 mg | Freq: Once | INTRAMUSCULAR | Status: AC
  Administered 2014-11-18: 4 mg via INTRAVENOUS
  Filled 2014-11-18: qty 2

## 2014-11-18 MED ORDER — FLEET ENEMA 7-19 GM/118ML RE ENEM: 1.0000 | ENEMA | Freq: Once | RECTAL | Status: AC | PRN

## 2014-11-18 MED ORDER — CEFAZOLIN SODIUM-DEXTROSE 2-3 GM-% IV SOLR
2.0000 g | INTRAVENOUS | Status: AC
  Administered 2014-11-19: 2 g via INTRAVENOUS
  Filled 2014-11-18: qty 50

## 2014-11-18 MED ORDER — ONDANSETRON HCL 4 MG/2ML IJ SOLN: 4.0000 mg | Freq: Four times a day (QID) | INTRAMUSCULAR | Status: DC | PRN

## 2014-11-18 MED ORDER — FLUOXETINE HCL 20 MG PO CAPS
20.0000 mg | ORAL_CAPSULE | Freq: Every day | ORAL | Status: DC
  Administered 2014-11-18 – 2014-11-22 (×4): 20 mg via ORAL
  Filled 2014-11-18 (×4): qty 1

## 2014-11-18 MED ORDER — DILTIAZEM HCL ER COATED BEADS 120 MG PO CP24
120.0000 mg | ORAL_CAPSULE | Freq: Every day | ORAL | Status: DC
  Administered 2014-11-18 – 2014-11-22 (×4): 120 mg via ORAL
  Filled 2014-11-18 (×4): qty 1

## 2014-11-18 MED ORDER — SODIUM CHLORIDE 0.9 % IV SOLN: Freq: Once | INTRAVENOUS | Status: DC

## 2014-11-18 MED ORDER — ALUM & MAG HYDROXIDE-SIMETH 200-200-20 MG/5ML PO SUSP: 30.0000 mL | Freq: Four times a day (QID) | ORAL | Status: DC | PRN

## 2014-11-18 MED ORDER — ACETAMINOPHEN 650 MG RE SUPP: 650.0000 mg | Freq: Four times a day (QID) | RECTAL | Status: DC | PRN

## 2014-11-18 MED ORDER — ONDANSETRON HCL 4 MG PO TABS: 4.0000 mg | ORAL_TABLET | Freq: Four times a day (QID) | ORAL | Status: DC | PRN

## 2014-11-18 MED ORDER — ONDANSETRON HCL 4 MG/2ML IJ SOLN
4.0000 mg | Freq: Once | INTRAMUSCULAR | Status: AC
  Administered 2014-11-18: 4 mg via INTRAVENOUS

## 2014-11-18 MED ORDER — MORPHINE SULFATE 2 MG/ML IJ SOLN
6.0000 mg | Freq: Once | INTRAMUSCULAR | Status: AC
  Administered 2014-11-18: 6 mg via INTRAVENOUS

## 2014-11-18 MED ORDER — DOCUSATE SODIUM 100 MG PO CAPS
100.0000 mg | ORAL_CAPSULE | Freq: Two times a day (BID) | ORAL | Status: DC
  Administered 2014-11-18 – 2014-11-21 (×7): 100 mg via ORAL
  Filled 2014-11-18 (×7): qty 1

## 2014-11-18 MED ORDER — MORPHINE SULFATE 2 MG/ML IJ SOLN: 2.0000 mg | INTRAMUSCULAR | Status: DC | PRN

## 2014-11-18 MED ORDER — MORPHINE SULFATE 2 MG/ML IJ SOLN
2.0000 mg | Freq: Once | INTRAMUSCULAR | Status: AC
  Administered 2014-11-18: 2 mg via INTRAVENOUS
  Filled 2014-11-18: qty 1

## 2014-11-18 MED ORDER — LEVETIRACETAM 500 MG PO TABS
500.0000 mg | ORAL_TABLET | Freq: Two times a day (BID) | ORAL | Status: DC
  Administered 2014-11-18 (×2): 500 mg via ORAL
  Filled 2014-11-18 (×2): qty 1

## 2014-11-18 MED ORDER — TRAZODONE HCL 50 MG PO TABS
25.0000 mg | ORAL_TABLET | Freq: Every evening | ORAL | Status: DC | PRN
  Filled 2014-11-18: qty 1

## 2014-11-18 MED ORDER — ACETAMINOPHEN 325 MG PO TABS: 650.0000 mg | ORAL_TABLET | Freq: Four times a day (QID) | ORAL | Status: DC | PRN

## 2014-11-18 MED ORDER — MORPHINE SULFATE 4 MG/ML IJ SOLN
6.0000 mg | Freq: Once | INTRAMUSCULAR | Status: DC
  Filled 2014-11-18: qty 2

## 2014-11-18 MED ORDER — ONDANSETRON HCL 4 MG/2ML IJ SOLN
INTRAMUSCULAR | Status: AC
  Filled 2014-11-18: qty 2

## 2014-11-18 MED ORDER — SODIUM CHLORIDE 0.9 % IV SOLN
INTRAVENOUS | Status: DC
  Administered 2014-11-18: 13:00:00 via INTRAVENOUS

## 2014-11-18 MED ORDER — CHLORHEXIDINE GLUCONATE CLOTH 2 % EX PADS
1.0000 | MEDICATED_PAD | CUTANEOUS | Status: AC
  Administered 2014-11-19: 1 via TOPICAL

## 2014-11-18 MED ORDER — BISACODYL 10 MG RE SUPP: 10.0000 mg | Freq: Every day | RECTAL | Status: DC | PRN

## 2014-11-18 MED ORDER — HYDROCODONE-ACETAMINOPHEN 5-325 MG PO TABS: 1.0000 | ORAL_TABLET | ORAL | Status: DC | PRN

## 2014-11-18 NOTE — ED Notes (Signed)
Foley cath inserted by Deanna Mabe/Dimitrious Micciche Jeanella CrazePierce

## 2014-11-18 NOTE — ED Notes (Signed)
Type and screen as been drawn, I spoke with the lab to ensure that two units rbcs will be on hold for patient if needed during surgery, per Dr. Mort SawyersHarrison's order. Patient signed blood consent. Will send to floor.   Patient and her son stated they preferred to wait and speak with Dr. Romeo AppleHarrison in detail regarding her pending surgical procedure before signing surgical consent. Will send unsigned form to floor.  Report given to receiving nurse, Rena on Dept 300, all questions answered.

## 2014-11-18 NOTE — ED Provider Notes (Signed)
CSN: [id number]     Arrival date & time 11/18/14  [id number] History   First MD Initiated Contact with Patient 11/18/14 [phone]     Chief Complaint  Patient presents with  . Knee Pain     (Consider location/radiation/quality/duration/timing/severity/associated sxs/prior Treatment) HPI Comments: Pt comes in with c/o knee pain after a fall this morning. Pt states that she stood out of bed and her feet got tangled in the walker. Denies loc of dizziness with the fall. Pt states that he can't put weight on her left leg because of knee pain. Denies numbness or weakness. No previous injury to that leg  The history is provided by the patient. No language interpreter was used.    Past Medical History  Diagnosis Date  . Hyperlipidemia   . Hypertension   . Seizures january 2010    Pt was running into the house to turn off the stove , fell and hit her head,. Since then she has developed seizure disorder   . Seizures     Dr. Gerilyn Pilgrimoonquah   . Restless legs syndrome   . GERD (gastroesophageal reflux disease)   . Gastritis, Helicobacter pylori 2010    Rx. ABO for 10 days   . Esophageal web 2010    DILATION 16 MM  . Diverticulosis OCT 2010 TCS    SIGMOID COLON   Past Surgical History  Procedure Laterality Date  . Vesicovaginal fistula closure w/ tah  1966    bleeding   . Colonoscopy  OCT 2010    TORTUOUS, Sml IH, Tics, MULTIPLE SIMPLE ADENOMAS (<6MM)  . Upper gastrointestinal endoscopy  AUG 2010    SAVARY  . Abdominal hysterectomy    . Orif hip fracture  04/05/2012    Procedure: OPEN REDUCTION INTERNAL FIXATION HIP;  Surgeon: Vickki HearingStanley E Harrison, MD;  Location: [facility];  Service: Orthopedics;  Laterality: Right;   Family History  Problem Relation Age of Onset  . Cancer Mother     bladder   . Diabetes Mother   . Diabetes Father   . Emphysema Father   . GER disease Brother   . Colon cancer Neg Hx   . Colon polyps Neg Hx    History  Substance Use Topics  . Smoking status: Never Smoker   .  Smokeless tobacco: Never Used  . Alcohol Use: No   OB History    No data available     Review of Systems  All other systems reviewed and are negative.     Allergies  Review of patient's allergies indicates no known allergies.  Home Medications   Prior to Admission medications   Medication Sig Start Date End Date Taking? Authorizing Provider  alendronate (FOSAMAX) 70 MG tablet Take 1 tablet (70 mg total) by mouth every 7 (seven) days. Take with a full glass of water on an empty stomach. 11/12/14   Kerri PerchesMargaret E Simpson, MD  aspirin 81 MG tablet Take 81 mg by mouth daily.    Historical Provider, MD  calcium-vitamin D (OSCAL WITH D) 500-200 MG-UNIT TABS TAKE (1) TABLET BY MOUTH (3) TIMES DAILY WITH MEALS. 01/23/12   Kerri PerchesMargaret E Simpson, MD  diltiazem (CARDIZEM CD) 120 MG 24 hr capsule TAKE ONE CAPSULE DAILY FOR BLOOD PRESSURE. 11/12/14   Kerri PerchesMargaret E Simpson, MD  FLUoxetine (PROZAC) 20 MG capsule TAKE 1 CAPSULE BY MOUTH ONCE A DAY. 11/12/14   Kerri PerchesMargaret E Simpson, MD  levETIRAcetam (KEPPRA) 500 MG tablet TAKE 1 TABLET BY MOUTH TWICE A DAY AS DIRECTED.  04/29/14   Kerri Perches, MD  lovastatin (MEVACOR) 20 MG tablet Take 1 tablet (20 mg total) by mouth at bedtime. 04/29/14   Kerri Perches, MD  Multiple Vitamin (MULTIVITAMIN) tablet Take 1 tablet by mouth daily.    Historical Provider, MD   BP 159/75 mmHg  Pulse 62  Temp(Src) 97.7 F (36.5 C) (Oral)  Resp 20  Ht  (1.702 m)  Wt 174 lb (78.926 kg)  BMI 27.25 kg/m2  SpO2 96% Physical Exam  Constitutional: She is oriented to person, place, and time. She appears well-developed and well-nourished.  HENT:  Head: Normocephalic and atraumatic.  Eyes: Conjunctivae and EOM are normal. Pupils are equal, round, and reactive to light.  Neck: Normal range of motion. Neck supple.  Cardiovascular: Normal rate and regular rhythm.   Pulmonary/Chest: Effort normal.  Abdominal: Soft. Bowel sounds are normal. There is no tenderness.   Musculoskeletal:  Left left is shortened. Pt tender in the lateral left hip and the left knee. Pt unable to flex or fully extend leg. Pulses intact  Neurological: She is alert and oriented to person, place, and time.  Skin: Skin is warm and dry.  Psychiatric: She has a normal mood and affect.  Nursing note and vitals reviewed.   ED Course  Procedures (including critical care time) Labs Review Labs Reviewed - No data to display  Imaging Review Dg Chest [facility] 1 View  11/18/2014   CLINICAL DATA:  Knee pain secondary to a fall this morning. Pre operative respiratory exam. Acute left hip fracture.  EXAM: PORTABLE CHEST - 1 VIEW  COMPARISON:  04/04/2012  FINDINGS: Heart size and pulmonary vascularity are normal and the lungs are clear. No acute osseous abnormality. Severe glenohumeral joint arthritis bilaterally.  IMPRESSION: No acute abnormalities.   Electronically Signed   By: Francene Boyers M.D.   On: 11/18/2014 10:17   Dg Knee Complete 4 Views Left  11/18/2014   CLINICAL DATA:  Acute left knee pain after fall at home today. Initial encounter.  EXAM: LEFT KNEE - COMPLETE 4+ VIEW  COMPARISON:  None.  FINDINGS: There is no evidence of fracture, dislocation, or joint effusion. Severe narrowing of medial joint space with osteophyte formation is noted. Mild narrowing is noted laterally, with moderate narrowing of patellofemoral space. Soft tissues are unremarkable.  IMPRESSION: Severe degenerative joint disease is noted. No acute abnormality seen in the left knee.   Electronically Signed   By: Lupita Raider, M.D.   On: 11/18/2014 09:29   Dg Hip Unilat With Pelvis 2-3 Views Left  11/18/2014   CLINICAL DATA:  Fall at home with left knee pain. Initial encounter.  EXAM: LEFT HIP (WITH PELVIS) 2-3 VIEWS  COMPARISON:  None.  FINDINGS: There is a left femoral neck fracture with varus angulation. The left femoral head remains located. No evidence of acute pelvic ring fracture.  Remote intertrochanteric right  femur fracture status post ORIF. There is prominent heterotopic ossification contiguous with the distracted lesser trochanter fragment.  Marked osteopenia.  IMPRESSION: Displaced left femoral neck fracture.   Electronically Signed   By: Marnee Spring M.D.   On: 11/18/2014 09:29     EKG Interpretation   Date/Time:  Thursday November 18 2014 09:37:03 EST Ventricular Rate:  73 PR Interval:  168 QRS Duration: 100 QT Interval:  440 QTC Calculation: 485 R Axis:   -3 Text Interpretation:  Sinus rhythm No significant change was found  Confirmed by Manus Gunning  MD, STEPHEN [phone]) on  11/18/2014 9:40:48 AM      MDM   Final diagnoses:  Fall  Pre-op evaluation  Femoral neck fracture, left, closed, initial encounter    DR. Romeo Apple to see pt on floor. hospitalist to admit. Pre op labs ekg and xray done.    Teressa Lower, NP 11/18/14 1105  Glynn Octave, MD 11/18/14 [phone]

## 2014-11-18 NOTE — Consult Note (Addendum)
Dr. Ardyth HarpsHernandez  Left femoral neck fracture  Patient had a right gamma nail placed in 2013. She was walking with her walker tripped and fell fractured her left hip. Complains of isolated left hip and groin pain nonradiating located over the left hip and groin area with swelling decreased range of motion painful range of motion and inability to ambulate  System review complains of forgetfulness  Multiple joint aches  All others negative   Medical history as follows Past Medical History  Diagnosis Date  . Hyperlipidemia   . Hypertension   . Seizures january 2010    Pt was running into the house to turn off the stove , fell and hit her head,. Since then she has developed seizure disorder   . Seizures     Dr. Gerilyn Pilgrimoonquah   . Restless legs syndrome   . GERD (gastroesophageal reflux disease)   . Gastritis, Helicobacter pylori 2010    Rx. ABO for 10 days   . Esophageal web 2010    DILATION 16 MM  . Diverticulosis OCT 2010 TCS    SIGMOID COLON  . Hip fracture, left     2016   Past Surgical History  Procedure Laterality Date  . Vesicovaginal fistula closure w/ tah  1966    bleeding   . Colonoscopy  OCT 2010    TORTUOUS, Sml IH, Tics, MULTIPLE SIMPLE ADENOMAS (<6MM)  . Upper gastrointestinal endoscopy  AUG 2010    SAVARY  . Abdominal hysterectomy    . Orif hip fracture  04/05/2012    Procedure: OPEN REDUCTION INTERNAL FIXATION HIP;  Surgeon: Vickki HearingStanley E Louna Rothgeb, MD;  Location: [facility];  Service: Orthopedics;  Laterality: Right;    BP 110/51 mmHg  Pulse 74  Temp(Src) 98.7 F (37.1 C) (Oral)  Resp 18  Ht 5\' 7"  (1.702 m)  Wt 179 lb 14.3 oz (81.6 kg)  BMI 28.17 kg/m2  SpO2 93%   Physical Exam  Constitutional: She is well-developed, well-nourished, and in no distress. No distress.  HENT:  Head: Normocephalic.  Eyes: Right eye exhibits no discharge. Left eye exhibits no discharge.  Neck: Neck supple. No JVD present. No tracheal deviation present. No thyromegaly present.   Cardiovascular: Normal rate and intact distal pulses.   Pulmonary/Chest: No respiratory distress.  Abdominal: She exhibits no distension.  Musculoskeletal:  Upper extremities normal  Right lower extremity is out to length. Incision over the right hip healed nicely. No tenderness noted proximally. Muscle tone normal. Neurovascular exam intact joints reduced without subluxation atrophy or tremor    Lymphadenopathy:    She has no cervical adenopathy.  Neurological: She is alert.  Skin: Skin is warm and dry. She is not diaphoretic.  Psychiatric: Mood, affect and judgment normal.  Vitals reviewed.   Pain and tenderness left thigh and hip , normal muscle tone, knee reduced. Ankle reduced. Range of motion deferred pain.  Left femoral neck fracture  Discussed treatment options with patient and her son  Recommend partial hip replacement  Left partial hip replacement tomorrow.

## 2014-11-18 NOTE — H&P (Signed)
[facility] History and Physical  Alejandra Cruz ZOX:[id number] DOB: Dec 04, 1932 DOA: 11/18/2014  Referring physician:  PCP: Syliva Overman, MD   Chief Complaint: left hip pain  HPI: Alejandra Cruz is a very pleasant 81 y.o. female with a past medical history that includes hyperlipidemia, hypertension, seizures, GERD presents to the emergency department with the chief complaint of left hip pain. Initial evaluation in the emergency department reveals left femoral neck fracture as well as microscopic hematuria.  Information obtained from the patient. She reports awakening about 5:30 this morning she got up to go the bathroom using her walker per her norm and she "lost her balance and fell". She denies any dizziness syncope loss of consciousness. She denies any chest pain palpitation shortness of breath. She states she was unable to get up due to the pain she experienced in her left hip. She used her medical alert and EMS arrived.  He reports being in her usual state of health i.e. no fever chills cough nausea vomiting diarrhea constipation. She denies any dysuria hematuria frequency or urgency. She reports her last bowel movement was yesterday and was normal in color and consistency.  Workup in the emergency department includes basic metabolic panel is unremarkable, complete blood count is unremarkable, urinalysis with you squama cell epithelial cells, RBCs too numerous to count otherwise unremarkable, chest x-ray without cardiopulmonary process, left knee x-ray Severe degenerative joint disease is noted. No acute abnormality seen in the left knee, x-ray of unilateral hip with pelvis revealing left femoral neck fracture and EKG with normal sinus rhythm.  Time of my exam she is hemodynamically stable afebrile and not hypoxic. In the emergency department she is given morphine for pain and gentle IV fluids   Review of Systems:  10 point review of systems completed and all systems are negative  except as indicated in the history of present illness  Past Medical History  Diagnosis Date  . Hyperlipidemia   . Hypertension   . Seizures january 2010    Pt was running into the house to turn off the stove , fell and hit her head,. Since then she has developed seizure disorder   . Seizures     Dr. Gerilyn Pilgrim   . Restless legs syndrome   . GERD (gastroesophageal reflux disease)   . Gastritis, Helicobacter pylori 2010    Rx. ABO for 10 days   . Esophageal web 2010    DILATION 16 MM  . Diverticulosis OCT 2010 [facility]    SIGMOID COLON  . Hip fracture, left     2016   Past Surgical History  Procedure Laterality Date  . Vesicovaginal fistula closure w/ tah  1966    bleeding   . Colonoscopy  OCT 2010    TORTUOUS, Sml IH, Tics, MULTIPLE SIMPLE ADENOMAS (<6MM)  . Upper gastrointestinal endoscopy  AUG 2010    SAVARY  . Abdominal hysterectomy    . Orif hip fracture  04/05/2012    Procedure: OPEN REDUCTION INTERNAL FIXATION HIP;  Surgeon: Vickki Hearing, MD;  Location: [facility];  Service: Orthopedics;  Laterality: Right;   Social History:  reports that she has never smoked. She has never used smokeless tobacco. She reports that she does not drink alcohol or use illicit drugs. He lives alone she uses a walker for ambulation she is independent ADL's No Known Allergies  Family History  Problem Relation Age of Onset  . Cancer Mother     bladder   . Diabetes Mother   .  Diabetes Father   . Emphysema Father   . GER disease Brother   . Colon cancer Neg Hx   . Colon polyps Neg Hx      Prior to Admission medications   Medication Sig Start Date End Date Taking? Authorizing Provider  alendronate (FOSAMAX) 70 MG tablet Take 1 tablet (70 mg total) by mouth every 7 (seven) days. Take with a full glass of water on an empty stomach. Patient taking differently: Take 70 mg by mouth every 7 (seven) days. Takes on Sundays. 11/12/14  Yes Kerri PerchesMargaret E Simpson, MD  aspirin 81 MG tablet Take 81 mg by mouth  daily.   Yes Historical Provider, MD  calcium-vitamin D (OSCAL WITH D) 500-200 MG-UNIT TABS TAKE (1) TABLET BY MOUTH (3) TIMES DAILY WITH MEALS. 01/23/12  Yes Kerri PerchesMargaret E Simpson, MD  diltiazem (CARDIZEM CD) 120 MG 24 hr capsule TAKE ONE CAPSULE DAILY FOR BLOOD PRESSURE. Patient taking differently: Take 120 mg by mouth daily.  11/12/14  Yes Kerri PerchesMargaret E Simpson, MD  FLUoxetine (PROZAC) 20 MG capsule TAKE 1 CAPSULE BY MOUTH ONCE A DAY. 11/12/14  Yes Kerri PerchesMargaret E Simpson, MD  levETIRAcetam (KEPPRA) 500 MG tablet TAKE 1 TABLET BY MOUTH TWICE A DAY AS DIRECTED. Patient taking differently: Take 500 mg by mouth 2 (two) times daily.  04/29/14  Yes Kerri PerchesMargaret E Simpson, MD  lovastatin (MEVACOR) 20 MG tablet Take 1 tablet (20 mg total) by mouth at bedtime. 04/29/14  Yes Kerri PerchesMargaret E Simpson, MD  Multiple Vitamin (MULTIVITAMIN) tablet Take 1 tablet by mouth daily.   Yes Historical Provider, MD   Physical Exam: Filed Vitals:   11/18/14 0900 11/18/14 0930 11/18/14 1000 11/18/14 1030  BP: 148/72 134/67 138/75 131/61  Pulse: 64 67  71  Temp:      TempSrc:      Resp: 11 21 11 16   Height:      Weight:      SpO2: 95% 93%  92%    Wt Readings from Last 3 Encounters:  11/18/14 78.926 kg (174 lb)  11/12/14 78.98 kg (174 lb 1.9 oz)  08/18/14 77.293 kg (170 lb 6.4 oz)    General:  Appears calm and somewhat uncomfortable Eyes: PERRL, normal lids, irises & conjunctiva ENT: grossly normal hearing, lips & tongue, no teeth Neck: no LAD, masses or thyromegaly Cardiovascular: RRR, +murmur. No LE edema.  Respiratory: CTA bilaterally, no w/r/r. Normal respiratory effort. Abdomen: soft, ntnd positive bowel sounds no guarding Skin: no rash or induration seen on limited exam Musculoskeletal: Left leg somewhat shorter than right and externally rotated, decreased range of motion to hip and knee due to pain, pedal pulses present and palpable, other joints without erythema or swelling Psychiatric: grossly normal mood and affect,  speech fluent and appropriate Neurologic: grossly non-focal. Alert and oriented 3 speech clear           Labs on Admission:  Basic Metabolic Panel:  Recent Labs Lab 11/18/14 0936  NA 139  K 4.2  CL 104  CO2 32  GLUCOSE 99  BUN 17  CREATININE 0.60  CALCIUM 8.4   Liver Function Tests: No results for input(s): AST, ALT, ALKPHOS, BILITOT, PROT, ALBUMIN in the last 168 hours. No results for input(s): LIPASE, AMYLASE in the last 168 hours. No results for input(s): AMMONIA in the last 168 hours. CBC:  Recent Labs Lab 11/18/14 0936  WBC 6.6  NEUTROABS 5.1  HGB 12.3  HCT 37.8  MCV 94.5  PLT 168   Cardiac Enzymes:  No results for input(s): CKTOTAL, CKMB, CKMBINDEX, TROPONINI in the last 168 hours.  BNP (last 3 results) No results for input(s): BNP in the last 8760 hours.  ProBNP (last 3 results)  Recent Labs  01/03/14 1052  PROBNP 398.2    CBG: No results for input(s): GLUCAP in the last 168 hours.  Radiological Exams on Admission: Dg Chest Port 1 View  11/18/2014   CLINICAL DATA:  Knee pain secondary to a fall this morning. Pre operative respiratory exam. Acute left hip fracture.  EXAM: PORTABLE CHEST - 1 VIEW  COMPARISON:  04/04/2012  FINDINGS: Heart size and pulmonary vascularity are normal and the lungs are clear. No acute osseous abnormality. Severe glenohumeral joint arthritis bilaterally.  IMPRESSION: No acute abnormalities.   Electronically Signed   By: Francene Boyers M.D.   On: 11/18/2014 10:17   Dg Knee Complete 4 Views Left  11/18/2014   CLINICAL DATA:  Acute left knee pain after fall at home today. Initial encounter.  EXAM: LEFT KNEE - COMPLETE 4+ VIEW  COMPARISON:  None.  FINDINGS: There is no evidence of fracture, dislocation, or joint effusion. Severe narrowing of medial joint space with osteophyte formation is noted. Mild narrowing is noted laterally, with moderate narrowing of patellofemoral space. Soft tissues are unremarkable.  IMPRESSION: Severe  degenerative joint disease is noted. No acute abnormality seen in the left knee.   Electronically Signed   By: Lupita Raider, M.D.   On: 11/18/2014 09:29   Dg Hip Unilat With Pelvis 2-3 Views Left  11/18/2014   CLINICAL DATA:  Fall at home with left knee pain. Initial encounter.  EXAM: LEFT HIP (WITH PELVIS) 2-3 VIEWS  COMPARISON:  None.  FINDINGS: There is a left femoral neck fracture with varus angulation. The left femoral head remains located. No evidence of acute pelvic ring fracture.  Remote intertrochanteric right femur fracture status post ORIF. There is prominent heterotopic ossification contiguous with the distracted lesser trochanter fragment.  Marked osteopenia.  IMPRESSION: Displaced left femoral neck fracture.   Electronically Signed   By: Marnee Spring M.D.   On: 11/18/2014 09:29    EKG: Independently reviewed normal sinus rhythm  Assessment/Plan Principal Problem:   Hip fracture: Left femoral. Will admit to MedSurg. Orthopedics will consult. Pain management. Will keep her nothing by mouth until seen by orthopedics in case she needs surgery and he can be scheduled today. Hold Lovenox use SCDs.  Active Problems: Essential hypertension: Controlled. Will continue home Cardizem.    Hyperlipemia: Obtain been panel continue statin    Depression with anxiety: Stable at baseline. Continue home medications      Convulsions: She reports no seizures in many years. On Keppra will continue.   Dr Romeo Apple per ED  Code Status: full DVT Prophylaxis: Family Communication: son Disposition Plan: snf  Time spent: 60 minutes  [facility] Pager [phone]

## 2014-11-18 NOTE — ED Notes (Signed)
Patient brought in by EMS; states patient from home, fell this morning at 0500. Patient complaining of left knee pain. Some swelling noted to left knee. Denies back/neck pain.

## 2014-11-19 ENCOUNTER — Encounter (HOSPITAL_COMMUNITY): Payer: Self-pay | Admitting: *Deleted

## 2014-11-19 ENCOUNTER — Encounter (HOSPITAL_COMMUNITY)
Admission: EM | Disposition: A | Discharge status: Skilled Nursing Facility | Payer: Self-pay | Source: Home / Self Care | Attending: Internal Medicine

## 2014-11-19 ENCOUNTER — Inpatient Hospital Stay (HOSPITAL_COMMUNITY): Payer: Medicare Other | Admitting: Anesthesiology

## 2014-11-19 ENCOUNTER — Inpatient Hospital Stay (HOSPITAL_COMMUNITY): Payer: Medicare Other

## 2014-11-19 DIAGNOSIS — I10 Essential (primary) hypertension: Secondary | ICD-10-CM

## 2014-11-19 DIAGNOSIS — S72009A Fracture of unspecified part of neck of unspecified femur, initial encounter for closed fracture: Secondary | ICD-10-CM | POA: Diagnosis present

## 2014-11-19 DIAGNOSIS — R569 Unspecified convulsions: Secondary | ICD-10-CM

## 2014-11-19 DIAGNOSIS — S72002D Fracture of unspecified part of neck of left femur, subsequent encounter for closed fracture with routine healing: Secondary | ICD-10-CM

## 2014-11-19 HISTORY — PX: HIP ARTHROPLASTY: SHX981

## 2014-11-19 LAB — SURGICAL PCR SCREEN
MRSA, PCR: NEGATIVE
STAPHYLOCOCCUS AUREUS: NEGATIVE

## 2014-11-19 LAB — URINALYSIS, ROUTINE W REFLEX MICROSCOPIC
Bilirubin Urine: NEGATIVE
GLUCOSE, UA: NEGATIVE mg/dL
Ketones, ur: NEGATIVE mg/dL
Nitrite: NEGATIVE
Protein, ur: NEGATIVE mg/dL
SPECIFIC GRAVITY, URINE: 1.02 (ref 1.005–1.030)
UROBILINOGEN UA: 0.2 mg/dL (ref 0.0–1.0)
pH: 6 (ref 5.0–8.0)

## 2014-11-19 LAB — URINE MICROSCOPIC-ADD ON

## 2014-11-19 SURGERY — HEMIARTHROPLASTY, HIP, DIRECT ANTERIOR APPROACH, FOR FRACTURE
Anesthesia: Spinal | Site: Hip | Laterality: Left

## 2014-11-19 MED ORDER — MIRTAZAPINE 15 MG PO TABS
7.5000 mg | ORAL_TABLET | Freq: Every day | ORAL | Status: DC
  Administered 2014-11-19 – 2014-11-21 (×3): 7.5 mg via ORAL
  Filled 2014-11-19 (×3): qty 1

## 2014-11-19 MED ORDER — PROPOFOL INFUSION 10 MG/ML OPTIME
INTRAVENOUS | Status: DC | PRN
  Administered 2014-11-19: 20 ug/kg/min via INTRAVENOUS
  Administered 2014-11-19: 25 ug/kg/min via INTRAVENOUS

## 2014-11-19 MED ORDER — ASPIRIN EC 325 MG PO TBEC
325.0000 mg | DELAYED_RELEASE_TABLET | Freq: Every day | ORAL | Status: DC
  Administered 2014-11-20 – 2014-11-21 (×2): 325 mg via ORAL
  Filled 2014-11-19 (×3): qty 1

## 2014-11-19 MED ORDER — ONDANSETRON HCL 4 MG PO TABS: 4.0000 mg | ORAL_TABLET | Freq: Four times a day (QID) | ORAL | Status: DC | PRN

## 2014-11-19 MED ORDER — SODIUM CHLORIDE 0.9 % IJ SOLN
INTRAMUSCULAR | Status: AC
  Filled 2014-11-19: qty 20

## 2014-11-19 MED ORDER — BUPIVACAINE IN DEXTROSE 0.75-8.25 % IT SOLN
INTRATHECAL | Status: AC
  Filled 2014-11-19: qty 2

## 2014-11-19 MED ORDER — EPHEDRINE SULFATE 50 MG/ML IJ SOLN
INTRAMUSCULAR | Status: DC | PRN
  Administered 2014-11-19 (×2): 5 mg via INTRAVENOUS
  Administered 2014-11-19 (×3): 10 mg via INTRAVENOUS

## 2014-11-19 MED ORDER — MAGNESIUM CITRATE PO SOLN: 1.0000 | Freq: Once | ORAL | Status: AC | PRN

## 2014-11-19 MED ORDER — SODIUM CHLORIDE 0.9 % IR SOLN
Status: DC | PRN
  Administered 2014-11-19 (×2): 1000 mL

## 2014-11-19 MED ORDER — BUPIVACAINE-EPINEPHRINE (PF) 0.5% -1:200000 IJ SOLN
INTRAMUSCULAR | Status: AC
  Filled 2014-11-19: qty 60

## 2014-11-19 MED ORDER — FENTANYL CITRATE 0.05 MG/ML IJ SOLN
INTRAMUSCULAR | Status: DC | PRN
  Administered 2014-11-19: 25 ug via INTRAVENOUS
  Administered 2014-11-19 (×2): 12.5 ug via INTRAVENOUS
  Administered 2014-11-19: 25 ug via INTRAVENOUS

## 2014-11-19 MED ORDER — ACETAMINOPHEN 650 MG RE SUPP: 650.0000 mg | Freq: Four times a day (QID) | RECTAL | Status: DC | PRN

## 2014-11-19 MED ORDER — ONDANSETRON HCL 4 MG/2ML IJ SOLN
4.0000 mg | Freq: Once | INTRAMUSCULAR | Status: AC
  Administered 2014-11-19: 4 mg via INTRAVENOUS

## 2014-11-19 MED ORDER — METHOCARBAMOL 500 MG PO TABS: 500.0000 mg | ORAL_TABLET | Freq: Four times a day (QID) | ORAL | Status: DC | PRN

## 2014-11-19 MED ORDER — FENTANYL CITRATE 0.05 MG/ML IJ SOLN
INTRAMUSCULAR | Status: AC
Start: 2014-11-19 — End: 2014-11-19
  Filled 2014-11-19: qty 2

## 2014-11-19 MED ORDER — MIDAZOLAM HCL 2 MG/2ML IJ SOLN
INTRAMUSCULAR | Status: AC
  Filled 2014-11-19: qty 2

## 2014-11-19 MED ORDER — FENTANYL CITRATE 0.05 MG/ML IJ SOLN
INTRAMUSCULAR | Status: AC
  Filled 2014-11-19: qty 2

## 2014-11-19 MED ORDER — BUPIVACAINE-EPINEPHRINE (PF) 0.5% -1:200000 IJ SOLN
INTRAMUSCULAR | Status: DC | PRN
  Administered 2014-11-19: 60 mL

## 2014-11-19 MED ORDER — FENTANYL CITRATE 0.05 MG/ML IJ SOLN
25.0000 ug | INTRAMUSCULAR | Status: AC
  Administered 2014-11-19 (×2): 25 ug via INTRAVENOUS

## 2014-11-19 MED ORDER — PHENYLEPHRINE HCL 10 MG/ML IJ SOLN
INTRAMUSCULAR | Status: AC
  Filled 2014-11-19: qty 1

## 2014-11-19 MED ORDER — ACETAMINOPHEN 325 MG PO TABS
650.0000 mg | ORAL_TABLET | Freq: Four times a day (QID) | ORAL | Status: DC | PRN
  Administered 2014-11-19 – 2014-11-22 (×5): 650 mg via ORAL
  Filled 2014-11-19 (×5): qty 2

## 2014-11-19 MED ORDER — METOCLOPRAMIDE HCL 5 MG/ML IJ SOLN: 5.0000 mg | Freq: Three times a day (TID) | INTRAMUSCULAR | Status: DC | PRN

## 2014-11-19 MED ORDER — EPHEDRINE SULFATE 50 MG/ML IJ SOLN
INTRAMUSCULAR | Status: AC
  Filled 2014-11-19: qty 1

## 2014-11-19 MED ORDER — CEFAZOLIN SODIUM-DEXTROSE 2-3 GM-% IV SOLR
2.0000 g | Freq: Four times a day (QID) | INTRAVENOUS | Status: AC
  Administered 2014-11-19 (×2): 2 g via INTRAVENOUS
  Filled 2014-11-19 (×2): qty 50

## 2014-11-19 MED ORDER — ONDANSETRON HCL 4 MG/2ML IJ SOLN: 4.0000 mg | Freq: Once | INTRAMUSCULAR | Status: DC | PRN

## 2014-11-19 MED ORDER — SODIUM CHLORIDE 0.9 % IV SOLN
INTRAVENOUS | Status: DC
  Administered 2014-11-19: 23:00:00 via INTRAVENOUS
  Administered 2014-11-19: 1000 mL via INTRAVENOUS
  Administered 2014-11-21: 01:00:00 via INTRAVENOUS

## 2014-11-19 MED ORDER — PHENOL 1.4 % MT LIQD: 1.0000 | OROMUCOSAL | Status: DC | PRN

## 2014-11-19 MED ORDER — HYDROCODONE-ACETAMINOPHEN 5-325 MG PO TABS
1.0000 | ORAL_TABLET | Freq: Four times a day (QID) | ORAL | Status: DC | PRN
  Administered 2014-11-19 – 2014-11-20 (×2): 1 via ORAL
  Filled 2014-11-19 (×2): qty 1

## 2014-11-19 MED ORDER — METHOCARBAMOL 1000 MG/10ML IJ SOLN
500.0000 mg | Freq: Four times a day (QID) | INTRAVENOUS | Status: DC | PRN
  Filled 2014-11-19: qty 5

## 2014-11-19 MED ORDER — LACTATED RINGERS IV SOLN
INTRAVENOUS | Status: DC
  Administered 2014-11-19 (×2): via INTRAVENOUS

## 2014-11-19 MED ORDER — MORPHINE SULFATE 2 MG/ML IJ SOLN: 0.5000 mg | INTRAMUSCULAR | Status: DC | PRN

## 2014-11-19 MED ORDER — MENTHOL 3 MG MT LOZG: 1.0000 | LOZENGE | OROMUCOSAL | Status: DC | PRN

## 2014-11-19 MED ORDER — POLYETHYLENE GLYCOL 3350 17 G PO PACK
17.0000 g | PACK | Freq: Every day | ORAL | Status: DC
  Administered 2014-11-19 – 2014-11-21 (×3): 17 g via ORAL
  Filled 2014-11-19 (×4): qty 1

## 2014-11-19 MED ORDER — PROPOFOL 10 MG/ML IV BOLUS
INTRAVENOUS | Status: AC
  Filled 2014-11-19: qty 20

## 2014-11-19 MED ORDER — METOCLOPRAMIDE HCL 10 MG PO TABS: 5.0000 mg | ORAL_TABLET | Freq: Three times a day (TID) | ORAL | Status: DC | PRN

## 2014-11-19 MED ORDER — ONDANSETRON HCL 4 MG/2ML IJ SOLN
INTRAMUSCULAR | Status: AC
  Filled 2014-11-19: qty 2

## 2014-11-19 MED ORDER — MIDAZOLAM HCL 2 MG/2ML IJ SOLN
1.0000 mg | INTRAMUSCULAR | Status: DC | PRN
  Administered 2014-11-19: 2 mg via INTRAVENOUS

## 2014-11-19 MED ORDER — BUPIVACAINE IN DEXTROSE 0.75-8.25 % IT SOLN
INTRATHECAL | Status: DC | PRN
  Administered 2014-11-19: 15 mg via INTRATHECAL

## 2014-11-19 MED ORDER — BISACODYL 10 MG RE SUPP
10.0000 mg | Freq: Every day | RECTAL | Status: DC | PRN
  Filled 2014-11-19: qty 1

## 2014-11-19 MED ORDER — ONDANSETRON HCL 4 MG/2ML IJ SOLN: 4.0000 mg | Freq: Four times a day (QID) | INTRAMUSCULAR | Status: DC | PRN

## 2014-11-19 MED ORDER — FENTANYL CITRATE 0.05 MG/ML IJ SOLN: 25.0000 ug | INTRAMUSCULAR | Status: DC | PRN

## 2014-11-19 SURGICAL SUPPLY — 57 items
BAG HAMPER (MISCELLANEOUS) ×2 IMPLANT
BIT DRILL 2.8X128 (BIT) ×2 IMPLANT
BLADE 10 SAFETY STRL DISP (BLADE) IMPLANT
BLADE HEX COATED 2.75 (ELECTRODE) ×2 IMPLANT
BLADE SAGITTAL 25.0X1.27X90 (BLADE) ×2 IMPLANT
BRUSH FEMORAL CANAL (MISCELLANEOUS) IMPLANT
CAPT HIP HEMI 1 ×2 IMPLANT
CHLORAPREP W/TINT 26ML (MISCELLANEOUS) ×4 IMPLANT
CLOTH BEACON ORANGE TIMEOUT ST (SAFETY) ×2 IMPLANT
COVER LIGHT HANDLE STERIS (MISCELLANEOUS) ×4 IMPLANT
COVER PROBE W GEL 5X96 (DRAPES) ×2 IMPLANT
DECANTER SPIKE VIAL GLASS SM (MISCELLANEOUS) ×4 IMPLANT
DRAPE HIP W/POCKET STRL (DRAPE) ×2 IMPLANT
DRESSING ALLEVYN LIFE SACRUM (GAUZE/BANDAGES/DRESSINGS) ×2 IMPLANT
DRSG MEPILEX BORDER 4X12 (GAUZE/BANDAGES/DRESSINGS) ×2 IMPLANT
ELECT REM PT RETURN 9FT ADLT (ELECTROSURGICAL) ×2
ELECTRODE REM PT RTRN 9FT ADLT (ELECTROSURGICAL) ×1 IMPLANT
EVACUATOR 3/16  PVC DRAIN (DRAIN)
EVACUATOR 3/16 PVC DRAIN (DRAIN) IMPLANT
FACESHIELD LNG OPTICON STERILE (SAFETY) IMPLANT
GLOVE SKINSENSE NS SZ8.0 LF (GLOVE) ×2
GLOVE SKINSENSE STRL SZ8.0 LF (GLOVE) ×2 IMPLANT
GLOVE SS N UNI LF 8.5 STRL (GLOVE) ×2 IMPLANT
GOWN STRL REUS W/TWL LRG LVL3 (GOWN DISPOSABLE) ×6 IMPLANT
GOWN STRL REUS W/TWL XL LVL3 (GOWN DISPOSABLE) ×2 IMPLANT
INST SET MAJOR BONE (KITS) ×2 IMPLANT
KIT BLADEGUARD II DBL (SET/KITS/TRAYS/PACK) ×2 IMPLANT
KIT ROOM TURNOVER APOR (KITS) ×2 IMPLANT
MANIFOLD NEPTUNE II (INSTRUMENTS) ×2 IMPLANT
MARKER SKIN DUAL TIP RULER LAB (MISCELLANEOUS) ×2 IMPLANT
NEEDLE HYPO 21X1.5 SAFETY (NEEDLE) ×2 IMPLANT
NS IRRIG 1000ML POUR BTL (IV SOLUTION) ×4 IMPLANT
PACK TOTAL JOINT (CUSTOM PROCEDURE TRAY) ×2 IMPLANT
PAD ARMBOARD 7.5X6 YLW CONV (MISCELLANEOUS) ×2 IMPLANT
PASSER SUT SWANSON 36MM LOOP (INSTRUMENTS) IMPLANT
PILLOW HIP ABDUCTION LRG (ORTHOPEDIC SUPPLIES) IMPLANT
PILLOW HIP ABDUCTION MED (ORTHOPEDIC SUPPLIES) ×2 IMPLANT
PIN STMN 9X.142 IN (PIN) ×2 IMPLANT
SET BASIN LINEN APH (SET/KITS/TRAYS/PACK) ×2 IMPLANT
SPONGE LAP 4X18 X RAY DECT (DISPOSABLE) ×4 IMPLANT
STAPLER VISISTAT 35W (STAPLE) ×2 IMPLANT
STEINMANN PIN ×4 IMPLANT
SUT BRALON NAB BRD #1 30IN (SUTURE) ×2 IMPLANT
SUT ETHIBOND 5 LR DA (SUTURE) ×4 IMPLANT
SUT MNCRL 0 VIOLET CTX 36 (SUTURE) ×1 IMPLANT
SUT MON AB 2-0 CT1 36 (SUTURE) IMPLANT
SUT MONOCRYL 0 CTX 36 (SUTURE) ×1
SUT VIC AB 1 CT1 27 (SUTURE) ×2
SUT VIC AB 1 CT1 27XBRD ANTBC (SUTURE) ×2 IMPLANT
SYR 30ML LL (SYRINGE) ×2 IMPLANT
SYR BULB IRRIGATION 50ML (SYRINGE) ×2 IMPLANT
TOWER CARTRIDGE SMART MIX (DISPOSABLE) IMPLANT
TRAY FOLEY CATH 16FR SILVER (SET/KITS/TRAYS/PACK) IMPLANT
WATER STERILE IRR 1000ML POUR (IV SOLUTION) ×2 IMPLANT
YANKAUER SUCT 12FT TUBE ARGYLE (SUCTIONS) ×2 IMPLANT
YANKAUER SUCT BULB TIP 10FT TU (MISCELLANEOUS) ×2 IMPLANT
YANKAUER SUCT BULB TIP NO VENT (SUCTIONS) ×2 IMPLANT

## 2014-11-19 NOTE — Care Management Utilization Note (Signed)
UR completed 

## 2014-11-19 NOTE — Clinical Social Work Psychosocial (Signed)
Clinical Social Work Department BRIEF PSYCHOSOCIAL ASSESSMENT 11/19/2014  Patient:  Alejandra Cruz, Alejandra Cruz     Account Number:  [id number]     Admit date:  11/18/2014  Clinical Social Worker:  Legrand Como  Date/Time:  11/19/2014 02:29 PM  Referred by:  CSW  Date Referred:  11/19/2014 Referred for  SNF Placement   Other Referral:   Interview type:  Family Other interview type:   Son, Alejandra Cruz  Daughter, Alejandra Cruz    PSYCHOSOCIAL DATA Living Status:  ALONE Admitted from facility:   Level of care:   Primary support name:  Becca Bayne and Alejandra Cruz Primary support relationship to patient:  CHILD, ADULT Degree of support available:   Patient's children are very supportive.    CURRENT CONCERNS Current Concerns  Post-Acute Placement   Other Concerns:    SOCIAL WORK ASSESSMENT / PLAN CSW met with patient and children, Alejandra Cruz and Alejandra Cruz.  Patient was not awake.  Patient's son and daughter provided the history.  Alejandra Cruz indicated that his mother resides alone and ambulates with a walker. Alejandra Cruz indicated that she assist patient with full baths but that patient is able to wash herself up.  She indicated that patient does not require assistance with toileting.  Alejandra Cruz indicated that patient's children assist her with meal preparation and that  patient receives meals on wheels.  Both children indicated that they visit with patient often.  Alejandra Cruz indicated that they are aware that patient will likely need SNF placement for rehab following her hip surgery.  He indicated that patient has been at [facility] before for rehab and that would be their first choice.  He indicated that [place] wanted [place] the [place] and gave permission for patient send clinicals to [facility], [place], [facility] and [facility]. CSW provided family with SNF list.  Alejandra Cruz indicated that patient often makes poor choices with ambulation and will go out  to the dumpster without her walker, will go up the cement stairs at her apartment to visit neighbors without assistance.  He stated that based on this the family believes that after rehab patient may need ALF.  He requested a listing of ALF facilities in [place] and [place].  CSW provided Alejandra Cruz with an ALF listing for both [facility] and [employer].   Assessment/plan status:  Information/Referral to [employer] Other assessment/ plan:   Information/referral to community resources:    PATIENT'S/FAMILY'S RESPONSE TO PLAN OF CARE: Patient's children agreeable to SNF placement.  They report that patient has been in [facility] in the past and that she was aware that she would have to go to SNF again for rehab. They report that patient gives inconsistent responses when they discuss ALF placement.    Alejandra Cruz, Alejandra Cruz

## 2014-11-19 NOTE — Care Management Note (Addendum)
    Page 1 of 1   11/22/2014     10:51:50 AM CARE MANAGEMENT NOTE 11/22/2014  Patient:  Alejandra Cruz,[name]   Account Number:  [id number]  Date Initiated:  11/19/2014  Documentation initiated by:  Kathyrn SheriffHILDRESS,JESSICA  Subjective/Objective Assessment:   Pt admitted from home with fx hip. Pt had surgery for hip repair today. PT eval pending, anticipate need for placement at SNF for rehab. CSW aware and will arrange for placement pending PT eval. Will continue to follow.     Action/Plan:   Anticipated DC Date:  11/19/2014   Anticipated DC Plan:  SKILLED NURSING FACILITY  In-house referral  Clinical Social Worker      DC Planning Services  CM consult      Choice offered to / List presented to:             Status of service:  In process, will continue to follow Medicare Important Message given?  YES (If response is "NO", the following Medicare IM given date fields will be blank) Date Medicare IM given:  11/19/2014 Medicare IM given by:  Kathyrn SheriffHILDRESS,JESSICA Date Additional Medicare IM given:  11/22/2014 Additional Medicare IM given by:  Sharrie RothmanAMMY C Verley Pariseau  Discharge Disposition:  SKILLED NURSING FACILITY  Per UR Regulation:  Reviewed for med. necessity/level of care/duration of stay  If discussed at Long Length of Stay Meetings, dates discussed:    Comments:  11/22/14 1050 Arlyss Queenammy Claressa Hughley, RN BSN CM Pt discharged to [facility] today. CSW to arrange discharge to facility.  11/19/2014 1330 Kathyrn SheriffJessica Childress, RN, MSN, [facility]

## 2014-11-19 NOTE — Anesthesia Procedure Notes (Signed)
Spinal Patient location during procedure: OR Start time: 11/19/2014 8:04 AM Staffing Anesthesiologist: Laurene FootmanGONZALEZ, LUIS Performed by: anesthesiologist  Preanesthetic Checklist Completed: patient identified, site marked, surgical consent, pre-op evaluation, timeout performed, IV checked, risks and benefits discussed and monitors and equipment checked Spinal Block Patient position: left lateral decubitus Prep: Betadine Patient monitoring: heart rate, cardiac monitor, continuous pulse ox and blood pressure Approach: left paramedian Location: L2-3 Injection technique: single-shot Needle Needle type: Spinocan  Needle gauge: 22 G Assessment Sensory level: T6 Additional Notes Slow,clear CSF       [id number]     06/2015

## 2014-11-19 NOTE — Brief Op Note (Addendum)
11/18/2014 - 11/19/2014  10:01 AM  PATIENT:  Alejandra Cruz  81 y.o. female  PRE-OPERATIVE DIAGNOSIS:  left femoral neck fracture  POST-OPERATIVE DIAGNOSIS:  left femoral neck fracture  PROCEDURE:  Procedure(s): LEFT PARTIAL HIP REPLACEMENT (Left)   [place] basic size 7 femoral stem size 52 head size +5 bipolar neck Depuy  Assisted by Rolla NationBetty Ashley  Spinal anesthetic  EBL 150  Complications none  Operative findings completely displaced left femoral neck fracture normal acetabular cartilage.  SURGEON:  Surgeon(s) and Role:    * Vickki HearingStanley E Bailey Kolbe, MD - Primary   EBL:  Total I/O In: 1300 [I.V.:1300] Out: 250 [Urine:100; Blood:150]  BLOOD ADMINISTERED:none  DRAINS: none   LOCAL MEDICATIONS USED:  MARCAINE    and Amount: 60 ml  SPECIMEN:  No Specimen  DISPOSITION OF SPECIMEN:  N/A  COUNTS:  YES  TOURNIQUET:  * No tourniquets in log *  DICTATION: .Dragon Dictation  PLAN OF CARE: Admit to inpatient   PATIENT DISPOSITION:  PACU - hemodynamically stable.   Delay start of Pharmacological VTE agent (>24hrs) due to surgical blood loss or risk of bleeding: yes

## 2014-11-19 NOTE — Progress Notes (Signed)
Patient started on a clear liquid diet and tolerated it well.  AEB no evidence of choking, aspiration, nausea, or vomiting diet elevated to heart healthy.  Will continue to monitor her.

## 2014-11-19 NOTE — Progress Notes (Signed)
Notified MD of patient's increased temperature before surgery. Surgery called back and was okay with temperature.Notified day surgery, patient ready for surgery.  Will monitor patient.

## 2014-11-19 NOTE — Anesthesia Preprocedure Evaluation (Signed)
Anesthesia Evaluation  Patient identified by MRN, date of birth, ID band Patient awake    Reviewed: Allergy & Precautions, H&P , NPO status , Patient's Chart, lab work & pertinent test results  History of Anesthesia Complications Negative for: history of anesthetic complications  Airway Mallampati: II  TM Distance: >3 FB Neck ROM: full    Dental  (+) Edentulous Upper, Edentulous Lower   Pulmonary neg pulmonary ROS,  breath sounds clear to auscultation        Cardiovascular hypertension, Pt. on medications DVT Rhythm:Regular Rate:Normal     Neuro/Psych Seizures -, Well Controlled,  PSYCHIATRIC DISORDERS Depression    GI/Hepatic Neg liver ROS, GERD-  Controlled,Sips with meds at 0950 this am; Last food 10:30pm on 6/28   Endo/Other    Renal/GU      Musculoskeletal   Abdominal   Peds  Hematology   Anesthesia Other Findings   Reproductive/Obstetrics                             Anesthesia Physical Anesthesia Plan  ASA: III  Anesthesia Plan: Spinal   Post-op Pain Management:    Induction:   Airway Management Planned: Simple Face Mask  Additional Equipment:   Intra-op Plan:   Post-operative Plan:   Informed Consent: I have reviewed the patients History and Physical, chart, labs and discussed the procedure including the risks, benefits and alternatives for the proposed anesthesia with the patient or authorized representative who has indicated his/her understanding and acceptance.   Dental advisory given  Plan Discussed with: Anesthesiologist and Surgeon  Anesthesia Plan Comments:         Anesthesia Quick Evaluation

## 2014-11-19 NOTE — Transfer of Care (Signed)
Immediate Anesthesia Transfer of Care Note  Patient: Alejandra Cruz  Procedure(s) Performed: Procedure(s): LEFT PARTIAL HIP REPLACEMENT (Left)  Patient Location: PACU  Anesthesia Type:Spinal  Level of Consciousness: awake and patient cooperative  Airway & Oxygen Therapy: Patient Spontanous Breathing and Patient connected to face mask oxygen  Post-op Assessment: Report given to RN and Post -op Vital signs reviewed and stable  Post vital signs: Reviewed and stable  Last Vitals:  Filed Vitals:   11/19/14 0730  BP: 124/71  Pulse:   Temp:   Resp: 18    Complications: No apparent anesthesia complications

## 2014-11-19 NOTE — Progress Notes (Signed)
Incentive spirometer used by pt. Pt reached 1000. Oxygen saturation is at 100%. Pt alert and oriented and was turned from right side to supine position. Set up trapeze bar for pt overhead. Ice bag is placed to the left hip and heels are elevated. No drainage observed at post-operative site. Pt temp is 100.3, given Tylenol. Pt resting in bed comfortably

## 2014-11-19 NOTE — Anesthesia Postprocedure Evaluation (Signed)
  Anesthesia Post-op Note  Patient: Alejandra Cruz  Procedure(s) Performed: Procedure(s): LEFT PARTIAL HIP REPLACEMENT (Left)  Patient Location: PACU  Anesthesia Type:Spinal  Level of Consciousness: awake, alert , oriented and patient cooperative  Airway and Oxygen Therapy: Patient Spontanous Breathing  Post-op Pain: 3 /10, mild  Post-op Assessment: Post-op Vital signs reviewed, Patient's Cardiovascular Status Stable, Respiratory Function Stable, Patent Airway, Pain level controlled and No headache  Post-op Vital Signs: Reviewed and stable  Last Vitals:  Filed Vitals:   11/19/14 0730  BP: 124/71  Pulse:   Temp:   Resp: 18    Complications: No apparent anesthesia complications

## 2014-11-19 NOTE — Progress Notes (Signed)
[facility] PROGRESS NOTE  Alejandra Cruz ZOX:[id number] DOB: [birth year] DOA: 11/18/2014 PCP: Syliva OvermanMargaret Simpson, MD  Assessment/Plan: Principal Problem:  Hip fracture: Left femoral. S/P left partial hip replacement 11/19/14. Pain managed.   Active Problems: Essential hypertension: Controlled. Will continue home Cardizem.    Hyperlipemia: lipid panel with triglyceride 25, HDL 62, LDL 86. Continue home med   Depression with anxiety: Stable at baseline. Continue home medications   Convulsions: She reports no seizures in many years. On Keppra will continue.   Code Status: full Family Communication: son at bedside Disposition Plan: snf    Consultants:  ortho  Procedures:  Left hip surger  Antibiotics:  Ancef perioperatively  HPI/Subjective: Awake eating soup. Reports "no pain"  Objective: Filed Vitals:   11/19/14 1145  BP: 114/65  Pulse: 90  Temp: 97.8 F (36.6 C)  Resp: 18    Intake/Output Summary (Last 24 hours) at 11/19/14 1307 Last data filed at 11/19/14 1124  Gross per 24 hour  Intake   1540 ml  Output   1490 ml  Net     50 ml   Filed Weights   11/18/14 0816 11/18/14 1230  Weight: 78.926 kg (174 lb) 81.6 kg (179 lb 14.3 oz)    Exam:   General:  Appears calm comfortable  Cardiovascular: S1 and S2. +murmur no LE edema  Respiratory: normal effort BS clear bilaterally no wheeze  Abdomen: soft non-distended non-tender   Musculoskeletal: left hip with dressing dry and intact. Toes left foot warm and dry   Data Reviewed: Basic Metabolic Panel:  Recent Labs Lab 11/18/14 0936  NA 139  K 4.2  CL 104  CO2 32  GLUCOSE 99  BUN 17  CREATININE 0.60  CALCIUM 8.4   Liver Function Tests: No results for input(s): AST, ALT, ALKPHOS, BILITOT, PROT, ALBUMIN in the last 168 hours. No results for input(s): LIPASE, AMYLASE in the last 168 hours. No results for input(s): AMMONIA in the last 168 hours. CBC:  Recent Labs Lab 11/18/14 0936   WBC 6.6  NEUTROABS 5.1  HGB 12.3  HCT 37.8  MCV 94.5  PLT 168   Cardiac Enzymes: No results for input(s): CKTOTAL, CKMB, CKMBINDEX, TROPONINI in the last 168 hours. BNP (last 3 results) No results for input(s): BNP in the last 8760 hours.  ProBNP (last 3 results)  Recent Labs  01/03/14 1052  PROBNP 398.2    CBG: No results for input(s): GLUCAP in the last 168 hours.  Recent Results (from the past 240 hour(s))  Surgical pcr screen     Status: None   Collection Time: 11/19/14 12:01 AM  Result Value Ref Range Status   MRSA, PCR NEGATIVE NEGATIVE Final   Staphylococcus aureus NEGATIVE NEGATIVE Final    Comment:        The Xpert SA Assay (FDA approved for NASAL specimens in patients over 79 years of age), is one component of a comprehensive surveillance program.  Test performance has been validated by [facility] for patients greater than or equal to 79 year old. It is not intended to diagnose infection nor to guide or monitor treatment.      Studies: Pelvis Portable  11/19/2014   CLINICAL DATA:  Left total hip replacement  EXAM: PORTABLE PELVIS 1-2 VIEWS  COMPARISON:  None.  FINDINGS: Bipolar left hip hemiarthroplasty. The prosthesis is located and there is no periprosthetic fracture. The tip of the femoral stem is noted to be eccentric within the femoral diaphysis.  Remote intertrochanteric, and  possibly subtrochanteric, right femur fracture with complete healing. There is prominent heterotopic ossification about the lesser trochanteric fragment, but not bridging the hip joint. Visible portions of the associated prosthesis are intact. The visible pelvis is intact.  IMPRESSION: No acute findings related to new left hip arthroplasty.   Electronically Signed   By: Marnee Spring M.D.   On: 11/19/2014 11:59   Dg Chest Port 1 View  11/18/2014   CLINICAL DATA:  Knee pain secondary to a fall this morning. Pre operative respiratory exam. Acute left hip fracture.  EXAM:  PORTABLE CHEST - 1 VIEW  COMPARISON:  04/04/2012  FINDINGS: Heart size and pulmonary vascularity are normal and the lungs are clear. No acute osseous abnormality. Severe glenohumeral joint arthritis bilaterally.  IMPRESSION: No acute abnormalities.   Electronically Signed   By: Francene Boyers M.D.   On: 11/18/2014 10:17   Dg Knee Complete 4 Views Left  11/18/2014   CLINICAL DATA:  Acute left knee pain after fall at home today. Initial encounter.  EXAM: LEFT KNEE - COMPLETE 4+ VIEW  COMPARISON:  None.  FINDINGS: There is no evidence of fracture, dislocation, or joint effusion. Severe narrowing of medial joint space with osteophyte formation is noted. Mild narrowing is noted laterally, with moderate narrowing of patellofemoral space. Soft tissues are unremarkable.  IMPRESSION: Severe degenerative joint disease is noted. No acute abnormality seen in the left knee.   Electronically Signed   By: Lupita Raider, M.D.   On: 11/18/2014 09:29   Dg Hip Unilat With Pelvis 2-3 Views Left  11/18/2014   CLINICAL DATA:  Fall at home with left knee pain. Initial encounter.  EXAM: LEFT HIP (WITH PELVIS) 2-3 VIEWS  COMPARISON:  None.  FINDINGS: There is a left femoral neck fracture with varus angulation. The left femoral head remains located. No evidence of acute pelvic ring fracture.  Remote intertrochanteric right femur fracture status post ORIF. There is prominent heterotopic ossification contiguous with the distracted lesser trochanter fragment.  Marked osteopenia.  IMPRESSION: Displaced left femoral neck fracture.   Electronically Signed   By: Marnee Spring M.D.   On: 11/18/2014 09:29    Scheduled Meds: . [START ON 11/20/2014] aspirin EC  325 mg Oral Q breakfast  .  ceFAZolin (ANCEF) IV  2 g Intravenous Q6H  . diltiazem  120 mg Oral Daily  . docusate sodium  100 mg Oral BID  . FLUoxetine  20 mg Oral Daily  . mirtazapine  7.5 mg Oral QHS  . polyethylene glycol  17 g Oral Daily   Continuous Infusions: . sodium  chloride      Principal Problem:   Hip fracture Active Problems:   Hyperlipemia   Depression with anxiety   Essential hypertension   Convulsions   Hematuria, microscopic   Femoral neck fracture    Time spent: 35 minutes    [facility]  [facility] Pager [phone]. If 7PM-7AM, please contact night-coverage at [email or web address], password [facility] 11/19/2014, 1:07 PM  LOS: 1 day

## 2014-11-19 NOTE — Op Note (Signed)
11/18/2014 - 11/19/2014  10:01 AM  PATIENT:  Alejandra Cruz  81 y.o. female  PRE-OPERATIVE DIAGNOSIS:  left femoral neck fracture  POST-OPERATIVE DIAGNOSIS:  left femoral neck fracture  PROCEDURE:  Procedure(s): LEFT PARTIAL HIP REPLACEMENT (Left)   Implants [place] basic size 7 femoral stem size 52 head size +5 bipolar neck Depuy  Assisted by [place] NationBetty Ashley  Spinal anesthetic  EBL 150  Complications none  Operative findings completely displaced left femoral neck fracture normal acetabular cartilage.  SURGEON:  Surgeon(s) and Role:    * Vickki HearingStanley E Kjerstin Abrigo, MD - Primary   EBL:  Total I/O In: 1300 [I.V.:1300] Out: 250 [Urine:100; Blood:150]  BLOOD ADMINISTERED:none  DRAINS: none   LOCAL MEDICATIONS USED:  MARCAINE    and Amount: 60 ml  SPECIMEN:  No Specimen  DISPOSITION OF SPECIMEN:  N/A  COUNTS:  YES  TOURNIQUET:  * No tourniquets in log *  DICTATION: .Dragon Dictation  PLAN OF CARE: Admit to inpatient   PATIENT DISPOSITION:  PACU - hemodynamically stable.   Delay start of Pharmacological VTE agent (>24hrs) due to surgical blood loss or risk of bleeding: yes   Details of procedure  The patient was identified in the preoperative holding area as Alejandra Cruz. Her left hip was confirmed as a surgical site marked.  Patient was taken to the operating room for spinal anesthetic. She was placed in lateral decubitus position with appropriate axillary roll. She was held in position with the Stulberg hip positioner with the left side up  After sterile prep and drape timeout was completed  Prep was done with ChloraPrep  The incision was made over the greater trochanter extended proximally and distally and then deepened through the subcutaneous tennis tissue down to the fascia.  The fascia was split in line with the skin incision exposing the abductor muscles. The anterior half of the abductors were removed from the greater trochanter cannot with subperiosteal  dissection and tagged and retracted proximally  This included the gluteus medius minimus. A retractor was used to hold these tendons proximally. A capsulectomy was performed and the hip was dislocated. The head measured 52 mm. The acetabular was irrigated debrided was removed and then inspected. Acetabular cartilage was normal  The hip was dislocated anteriorly. A starter canal finder was used to open up the proximal portion of the canal followed by long canal finder and box osteotome.  Sequential broaching was performed up to a size 7 which fit nicely.  Trial reduction was performed with a +5 head and a 52 outer head with a 7 stem. The hip was stable with these components. We perform shuck test leg length test internal/external rotation and sleep position testing and there was no evidence of dislocation. The hip was reduced was dislocated with traction and a bone hook. Final irrigation was performed stop the deep tissues were injected with Marcaine with epinephrine. Implants were placed the hip was reduced again and range of motion test were repeated and matched trial reduction.  The abductors were closed with #5 Ethibond suture with a nice firm closure.  The fascia was closed with #5 Ethibond suture. Subcutaneous tissue was closed with 0 Monocryl suture and the skin was reapproximated with skin staples. We injected a second 30 mL of Marcaine with epinephrine beneath the fascial layer to assisted postop anesthesia  The patient was placed on the regular bed leg length final check was made there was no leg length discrepancy abduction pillow was placed the patient was taken  recovery in stable condition

## 2014-11-20 LAB — BASIC METABOLIC PANEL
Anion gap: 4 — ABNORMAL LOW (ref 5–15)
BUN: 13 mg/dL (ref 6–23)
CHLORIDE: 104 mmol/L (ref 96–112)
CO2: 33 mmol/L — ABNORMAL HIGH (ref 19–32)
Calcium: 8.1 mg/dL — ABNORMAL LOW (ref 8.4–10.5)
Creatinine, Ser: 0.64 mg/dL (ref 0.50–1.10)
GFR, EST NON AFRICAN AMERICAN: 83 mL/min — AB (ref >90)
GLUCOSE: 106 mg/dL — AB (ref 70–99)
Potassium: 4.1 mmol/L (ref 3.5–5.1)
Sodium: 141 mmol/L (ref 135–145)

## 2014-11-20 LAB — CBC
HCT: 30.1 % — ABNORMAL LOW (ref 36.0–46.0)
Hemoglobin: 9.8 g/dL — ABNORMAL LOW (ref 12.0–15.0)
MCH: 30.8 pg (ref 26.0–34.0)
MCHC: 32.6 g/dL (ref 30.0–36.0)
MCV: 94.7 fL (ref 78.0–100.0)
Platelets: 101 10*3/uL — ABNORMAL LOW (ref 150–400)
RBC: 3.18 MIL/uL — AB (ref 3.87–5.11)
RDW: 14.7 % (ref 11.5–15.5)
WBC: 7 10*3/uL (ref 4.0–10.5)

## 2014-11-20 MED ORDER — SODIUM CHLORIDE 0.9 % IV BOLUS (SEPSIS)
250.0000 mL | Freq: Once | INTRAVENOUS | Status: AC
  Administered 2014-11-20: 250 mL via INTRAVENOUS

## 2014-11-20 MED ORDER — PRAVASTATIN SODIUM 10 MG PO TABS
20.0000 mg | ORAL_TABLET | Freq: Every day | ORAL | Status: DC
  Administered 2014-11-20 – 2014-11-21 (×2): 20 mg via ORAL
  Filled 2014-11-20 (×2): qty 2

## 2014-11-20 NOTE — Progress Notes (Signed)
[facility] PROGRESS NOTE  Alejandra Cruz ZOX:[id number] DOB: Dec 28, 1932 DOA: 11/18/2014 PCP: Alejandra Overman, MD  Assessment/Plan: Left hip Fracture -S/p repair 2/12. -Pain is well controlled at present.  HTN -Well controlled. -Continue home meds  Hyperlipidemia -Restart lovastatin.  Depression -Stable. -Continue home meds.  Seizure Disorder -Continue keppra. -Has been years since her last seizure.  Code Status: Full Code Family Communication: Discussed with daughter at bedside  Disposition Plan: Awaiting SNF   Consultants:  Ortho   Antibiotics:  None   Subjective: No complaints  Objective: Filed Vitals:   11/20/14 0205 11/20/14 0353 11/20/14 0521 11/20/14 0800  BP: 133/62  125/54   Pulse: 73  87   Temp: 98.4 F (36.9 C)  99.2 F (37.3 C)   TempSrc: Oral  Oral   Resp: Height:      Weight:   81.421 kg (179 lb 8 oz)   SpO2: 100% 97% 90% 90%    Intake/Output Summary (Last 24 hours) at 11/20/14 1352 Last data filed at 11/20/14 0920  Gross per 24 hour  Intake      0 ml  Output   2400 ml  Net  -2400 ml   Filed Weights   11/18/14 0816 11/18/14 1230 11/20/14 0521  Weight: 78.926 kg (174 lb) 81.6 kg (179 lb 14.3 oz) 81.421 kg (179 lb 8 oz)    Exam:   General:  AA Ox3  Cardiovascular: RRR  Respiratory: CTA B  Abdomen: S/NT/ND/+BS  Extremities: no C/C/E   Neurologic:  Non-focal  Data Reviewed: Basic Metabolic Panel:  Recent Labs Lab 11/18/14 0936 11/20/14 0627  NA 139 141  K 4.2 4.1  CL 104 104  CO2 32 33*  GLUCOSE 99 106*  BUN 17 13  CREATININE 0.60 0.64  CALCIUM 8.4 8.1*   Liver Function Tests: No results for input(s): AST, ALT, ALKPHOS, BILITOT, PROT, ALBUMIN in the last 168 hours. No results for input(s): LIPASE, AMYLASE in the last 168 hours. No results for input(s): AMMONIA in the last 168 hours. CBC:  Recent Labs Lab 11/18/14 0936 11/20/14 0627  WBC 6.6 7.0  NEUTROABS 5.1  --   HGB  12.3 9.8*  HCT 37.8 30.1*  MCV 94.5 94.7  PLT 168 101*   Cardiac Enzymes: No results for input(s): CKTOTAL, CKMB, CKMBINDEX, TROPONINI in the last 168 hours. BNP (last 3 results) No results for input(s): BNP in the last 8760 hours.  ProBNP (last 3 results)  Recent Labs  01/03/14 1052  PROBNP 398.2    CBG: No results for input(s): GLUCAP in the last 168 hours.  Recent Results (from the past 240 hour(s))  Surgical pcr screen     Status: None   Collection Time: 11/19/14 12:01 AM  Result Value Ref Range Status   MRSA, PCR NEGATIVE NEGATIVE Final   Staphylococcus aureus NEGATIVE NEGATIVE Final    Comment:        The Xpert SA Assay (FDA approved for NASAL specimens in patients over 31 years of age), is one component of a comprehensive surveillance program.  Test performance has been validated by [facility] for patients greater than or equal to 69 year old. It is not intended to diagnose infection nor to guide or monitor treatment.      Studies: Pelvis Portable  11/19/2014   CLINICAL DATA:  Left total hip replacement  EXAM: PORTABLE PELVIS 1-2 VIEWS  COMPARISON:  None.  FINDINGS: Bipolar left hip hemiarthroplasty. The prosthesis is  located and there is no periprosthetic fracture. The tip of the femoral stem is noted to be eccentric within the femoral diaphysis.  Remote intertrochanteric, and possibly subtrochanteric, right femur fracture with complete healing. There is prominent heterotopic ossification about the lesser trochanteric fragment, but not bridging the hip joint. Visible portions of the associated prosthesis are intact. The visible pelvis is intact.  IMPRESSION: No acute findings related to new left hip arthroplasty.   Electronically Signed   By: Alejandra SpringJonathon  Watts M.D.   On: 11/19/2014 11:59    Scheduled Meds: . aspirin EC  325 mg Oral Q breakfast  . diltiazem  120 mg Oral Daily  . docusate sodium  100 mg Oral BID  . FLUoxetine  20 mg Oral Daily  . mirtazapine   7.5 mg Oral QHS  . polyethylene glycol  17 g Oral Daily   Continuous Infusions: . sodium chloride 100 mL/hr at 11/19/14 2304    Principal Problem:   Hip fracture Active Problems:   Hyperlipemia   Depression with anxiety   Essential hypertension   Convulsions   Hematuria, microscopic   Femoral neck fracture    Time spent: 25 minutes. Greater than 50% of this time was spent in direct contact with the patient coordinating care.    Alejandra Cruz  [facility] Pager [phone]  If 7PM-7AM, please contact night-coverage at [email or web address], password [email or web address] 11/20/2014, 1:52 PM  LOS: 2 days

## 2014-11-20 NOTE — Evaluation (Signed)
Physical Therapy Evaluation Patient Details Name: Alejandra Cruz MRN: [id number] DOB: 04-13-1933 Today's Date: 11/20/2014   History of Present Illness  [name] is a 81 yo female who fell at home on 11/17/2014 sustaining a Lt femoral fx .  On 11/19/2014 she underwent a partial hip replacement and is now being referred to PT to improve her mobilty.   Clinical Impression  [name] is a cooperative pt.  Daughter is present at time of treatment and is agreeable to SNF.  [name] is able to follow commands and should progress to previous functional ability although I expect progression to be slow.     Follow Up Recommendations SNF    Equipment Recommendations    none for this therapisty   Recommendations for Other Services   OT    Precautions / Restrictions Precautions Precautions: Fall Restrictions Weight Bearing Restrictions: No      Mobility  Bed Mobility Overal bed mobility: Needs Assistance Bed Mobility: Supine to Sit;Sit to Supine     Supine to sit: Max assist Sit to supine: Mod assist      Transfers Overall transfer level: Needs assistance Equipment used: Rolling walker (2 wheeled) Transfers: Sit to/from Stand;Lateral/Scoot Transfers Sit to Stand: Max assist        Lateral/Scoot Transfers: Mod assist                  Pertinent Vitals/Pain Pain Assessment: Faces Pain Score: 4  Pain Descriptors / Indicators: Aching Pain Intervention(s): Limited activity within patient's tolerance;Repositioned    Home Living Family/patient expects to be discharged to:: Skilled nursing facility   Available Help at Discharge: Family Type of Home: Apartment Home Access: Level entry     Home Layout: One level Home Equipment: Cane - quad      Prior Function Level of Independence: Independent with assistive device(s)                  Extremity/Trunk Assessment   Upper Extremity Assessment: Defer to OT evaluation           Lower Extremity  Assessment: Generalized weakness;RLE deficits/detail;LLE deficits/detail RLE Deficits / Details: strength generally3-/5       Communication   Communication: No difficulties  Cognition Arousal/Alertness: Awake/alert Behavior During Therapy: WFL for tasks assessed/performed Overall Cognitive Status: Within Functional Limits for tasks assessed                      General Comments      Exercises Total Joint Exercises Ankle Circles/Pumps: Both;10 reps Quad Sets: Both;10 reps Heel Slides: Both;AAROM;10 reps      Assessment/Plan    PT Assessment Patient needs continued PT services  PT Diagnosis Difficulty walking;Generalized weakness;Acute pain   PT Problem List Decreased strength;Decreased range of motion;Decreased activity tolerance;Decreased balance;Decreased mobility;Pain  PT Treatment Interventions Gait training;Therapeutic exercise   PT Goals (Current goals can be found in the Care Plan section)      Frequency Min 6X/week           End of Session Equipment Utilized During Treatment: Gait belt Activity Tolerance: Patient tolerated treatment well Patient left: in bed;with call bell/phone within reach;with family/visitor present           Time: 1120-1150 PT Time Calculation (min) (ACUTE ONLY): 30 min   Charges:   PT Evaluation $Initial PT Evaluation Tier I: 1 Procedure PT Treatments $Therapeutic Activity: 8-22 mins        RUSSELL,CINDY 11/20/2014, 11:55 AM

## 2014-11-20 NOTE — Progress Notes (Signed)
Patient ID: Alejandra Cruz, female   DOB: 1933-02-03, 81 y.o.   MRN: [id number]  POD # 1 left bipolar partial hip replacement  VS BP 125/54 mmHg  Pulse 87  Temp(Src) 99.2 F (37.3 C) (Oral)  Resp 20  Ht 5\' 7"  (1.702 m)  Wt 179 lb 8 oz (81.421 kg)  BMI 28.11 kg/m2  SpO2 90%   LABS  Hemoglobin & Hematocrit     Component Value Date/Time   HGB 9.8* 11/20/2014 0627   HCT 30.1* 11/20/2014 0627      DRESSING scant draining  Neuro-vasculo-motor status of operative limb normal  A/P stable Start PT

## 2014-11-20 NOTE — Social Work (Signed)
[facility] bed [facility] [place]. CSW made multiple attemptes to contact family to inform them of this bed offer.  CSW will try again.  Beverly Sessionsywan J Aylyn Wenzler MSW, LCSW [phone]

## 2014-11-20 NOTE — Progress Notes (Signed)
Patient was hypotensive. MD notified and 250 mL bolus was given. Will continue to monitor patient. Patient is stable at this time.

## 2014-11-20 NOTE — Plan of Care (Signed)
Spoke with Tywan the Child psychotherapistocial Worker on for this weekend about the MD wanting to possibly discharge the patient to SNF if we could find a bed available.  Tywan stated that he would call out to [place] since [place] is what the  Family prefers.  Tywan called me back and stated that a bed [facility] [place] gave a bed offer and would be willing to accept tomorrow.  He stated that the patient would need the an d/c summary ASAP so he could fax this to the facility in the am. He stated that he would call the family to see if they would except the offer.  He wanted to speak with the patient but I told him that she was confused. He stated that he would call the daughter Alfredia.  I notified Dr. Ardyth HarpsHernandez of the plan and she verbalized understanding.

## 2014-11-20 NOTE — Addendum Note (Signed)
Addendum  created 11/20/14 1636 by Despina Hiddenobert J Emmersyn Kratzke, CRNA   Modules edited: Notes Section   Notes Section:  File: [id number]

## 2014-11-20 NOTE — Anesthesia Postprocedure Evaluation (Signed)
  Anesthesia Post-op Note  Patient: Alejandra Cruz  Procedure(s) Performed: Procedure(s): LEFT PARTIAL HIP REPLACEMENT (Left)  Patient Location: room 314  Anesthesia Type:Spinal  Level of Consciousness: awake, alert , oriented and patient cooperative  Airway and Oxygen Therapy: Patient Spontanous Breathing and Patient connected to nasal cannula oxygen  Post-op Pain: 3 /10, mild  Post-op Assessment: Post-op Vital signs reviewed, Patient's Cardiovascular Status Stable, Respiratory Function Stable, Patent Airway, No signs of Nausea or vomiting and No headache  Post-op Vital Signs: Reviewed and stable  Last Vitals:  Filed Vitals:   11/20/14 1600  BP:   Pulse:   Temp:   Resp: 18    Complications: No apparent anesthesia complications

## 2014-11-21 LAB — BASIC METABOLIC PANEL
Anion gap: 5 (ref 5–15)
BUN: 14 mg/dL (ref 6–23)
CHLORIDE: 106 mmol/L (ref 96–112)
CO2: 29 mmol/L (ref 19–32)
Calcium: 7.8 mg/dL — ABNORMAL LOW (ref 8.4–10.5)
Creatinine, Ser: 0.5 mg/dL (ref 0.50–1.10)
GFR calc Af Amer: >90 mL/min (ref >90)
GFR calc non Af Amer: 90 mL/min — ABNORMAL LOW (ref >90)
Glucose, Bld: 95 mg/dL (ref 70–99)
Potassium: 3.7 mmol/L (ref 3.5–5.1)
Sodium: 140 mmol/L (ref 135–145)

## 2014-11-21 LAB — CBC
HCT: 29.2 % — ABNORMAL LOW (ref 36.0–46.0)
HEMOGLOBIN: 9.7 g/dL — AB (ref 12.0–15.0)
MCH: 31.2 pg (ref 26.0–34.0)
MCHC: 33.2 g/dL (ref 30.0–36.0)
MCV: 93.9 fL (ref 78.0–100.0)
Platelets: 111 10*3/uL — ABNORMAL LOW (ref 150–400)
RBC: 3.11 MIL/uL — ABNORMAL LOW (ref 3.87–5.11)
RDW: 14.8 % (ref 11.5–15.5)
WBC: 6.8 10*3/uL (ref 4.0–10.5)

## 2014-11-21 LAB — TYPE AND SCREEN
ABO/RH(D): O POS
ANTIBODY SCREEN: NEGATIVE
UNIT DIVISION: 0
Unit division: 0

## 2014-11-21 MED ORDER — ASPIRIN 325 MG PO TBEC: 325.0000 mg | DELAYED_RELEASE_TABLET | Freq: Every day | ORAL | Status: DC

## 2014-11-21 MED ORDER — ACETAMINOPHEN 325 MG PO TABS: 650.0000 mg | ORAL_TABLET | Freq: Four times a day (QID) | ORAL | Status: DC | PRN

## 2014-11-21 MED ORDER — PRAVASTATIN SODIUM 20 MG PO TABS: 20.0000 mg | ORAL_TABLET | Freq: Every day | ORAL | Status: DC

## 2014-11-21 MED ORDER — HYDROCODONE-ACETAMINOPHEN 5-325 MG PO TABS: 1.0000 | ORAL_TABLET | Freq: Four times a day (QID) | ORAL | Status: DC | PRN

## 2014-11-21 NOTE — Progress Notes (Signed)
Notified Dr. Ardyth HarpsHernandez of the patients BP 108/58 and Hr 106 inquired if the patient should take her extended release Cardizem today.  MD stated that it was okay for the patient to have the medication.  I will continue to monitor her.

## 2014-11-21 NOTE — Progress Notes (Signed)
Tried to recall CSW to see what the result of the [facility] bed.  No answer. Message left.

## 2014-11-21 NOTE — Plan of Care (Signed)
Notified Tywan Child psychotherapistocial Worker that the patients son is here in the room and I had updated the contact information so that he could notify him.  Voiced to him at this time the D/C was not available but I would call him when it was available.  He stated that he would call the patients son after he rounded on a couple patients.

## 2014-11-21 NOTE — Progress Notes (Signed)
Notified Tywan and Dr. Ardyth HarpsHernandez due to concerns voiced by the patients son about her discharge.  He stated that he wanted the patient to go [facility] because this is where his mom had been before and was comfortable with her being transferred there.  After talking with Tywan he stated that the information was faxed/sent to [facility] and they did not accept the patient and that they do not have to give reason why they did not be accepted.  After talking with Dr. Ardyth HarpsHernandez she confirmed what Tywan said about not having to give a reason why they did/sid not accept the patient.  She went on to say that she discussed with Herbert SetaHeather on Friday that the [facility] wanted [facility], but they did not have any beds available so that they had to fax out other facilities to see if they could get a bed offer.  I voiced what Tywan and Dr. Ardyth HarpsHernandez said about the situation, but I would call Dr. Ardyth HarpsHernandez back once more.  This was because the patients son Gaspar Garbelfred felt like his mother was being forced out of the hospital, this is what he voiced to me.  After talking again with Dr. Ardyth HarpsHernandez she restated what we talked about and stated that I should have the social worker to call the son to discuss the transfer process with him.  I verbalized understanding a re notified Tywan and asked him to call the patients son directly.  The cell number was given and he stated that he would notify Gaspar GarbeAlfred in 10 minutes.  As at this time Dr. Ardyth HarpsHernandez says it is okay to discharge the patient and Tywan stated they are waiting on the patient at the facility.

## 2014-11-21 NOTE — Social Work (Signed)
CSW has been in communication with family about bed offers [facility] both [facility]. Family stated that they [facility].  CSW explained to the family that [facility] has not offered a bed but because patient is medically cleared for discharged and has an appropriate placement that they will be made responsible for expenses of additional days in the hospital. Patient's family continuing to discuss and decide. LCSW has had 2 conversations with patient's daughter and attempted to call patient's son and left message for a return call.  CSW will continue to follow. Beverly Sessionsywan J Leyli Kevorkian MSW, LCSW [phone]

## 2014-11-21 NOTE — Progress Notes (Signed)
Attempted to notify Ty[facility] SW about the patients son concerns.  He stated that now that he has went and talked with his sister they prefer his mom to go to [facility].  He stated that he went over to the facility and talked with staff and saw the room she would be in and talked to the therapist and feel like they would be more comfortable with her being there so she can have easier access for family and church family.  A message was left to the cell phone for tywan to call due to call going to voicemail. I also notified Dr. Ardyth HarpsHernandez that the patient son stated that he may file a grievance due to he feeling like the patient is being forced out and not ready for discharge he stated that his mom just had surgery on Friday and Dr. Romeo AppleHarrison told them it would at least be Monday.  Dr. Ardyth HarpsHernandez notified Tywan and stated to me that according to SW that [facility] would have to be renotified due to the previous refusal of the bed offer. He wanted me to give Gaspar Garbelfred his cell number to him since had attempted to call with no answer.  Pt stated his phone was dead.   The SW cell number was given to Gaspar GarbeAlfred and I asked him to call him.  I will try to re notify SW.

## 2014-11-21 NOTE — Discharge Summary (Signed)
Physician Discharge Summary  Alejandra Cruz WJX:[id number] DOB: 09-04-1933 DOA: 11/18/2014  PCP: Syliva Overman, MD  Admit date: 11/18/2014 Discharge date: 11/21/2014  Time spent: 45 minutes  Recommendations for Outpatient Follow-up:  -Will be discharged to SNF today for ST rehab following hip fracture. -Follow up with Dr. Romeo Apple as scheduled by him.   Discharge Diagnoses:  Principal Problem:   Hip fracture Active Problems:   Hyperlipemia   Depression with anxiety   Essential hypertension   Convulsions   Hematuria, microscopic   Femoral neck fracture   Discharge Condition: Stable and improved  Filed Weights   11/18/14 1230 11/20/14 0521 11/21/14 0500  Weight: 81.6 kg (179 lb 14.3 oz) 81.421 kg (179 lb 8 oz) 87.998 kg (194 lb)    History of present illness:  Alejandra Cruz is a very pleasant 81 y.o. female with a past medical history that includes hyperlipidemia, hypertension, seizures, GERD presents to the emergency department with the chief complaint of left hip pain. Initial evaluation in the emergency department reveals left femoral neck fracture as well as microscopic hematuria.  Information obtained from the patient. She reports awakening about 5:30 this morning she got up to go the bathroom using her walker per her norm and she "lost her balance and fell". She denies any dizziness syncope loss of consciousness. She denies any chest pain palpitation shortness of breath. She states she was unable to get up due to the pain she experienced in her left hip. She used her medical alert and EMS arrived.  He reports being in her usual state of health i.e. no fever chills cough nausea vomiting diarrhea constipation. She denies any dysuria hematuria frequency or urgency. She reports her last bowel movement was yesterday and was normal in color and consistency.  Workup in the emergency department includes basic metabolic panel is unremarkable, complete blood count is  unremarkable, urinalysis with you squama cell epithelial cells, RBCs too numerous to count otherwise unremarkable, chest x-ray without cardiopulmonary process, left knee x-ray Severe degenerative joint disease is noted. No acute abnormality seen in the left knee, x-ray of unilateral hip with pelvis revealing left femoral neck fracture and EKG with normal sinus rhythm.  Time of my exam she is hemodynamically stable afebrile and not hypoxic. In the emergency department she is given morphine for pain and gentle IV fluids   Hospital Course:   Left hip Fracture -S/p repair 2/12. -Pain is well controlled at present.  HTN -Well controlled. -Continue home meds  Hyperlipidemia -Restart lovastatin.  Depression -Stable. -Continue home meds.  Seizure Disorder -Continue keppra. -Has been years since her last seizure.  Procedures:  Left hip repair 2/12   Consultations:  Ortho, Dr. Romeo Apple  Discharge Instructions  Discharge Instructions    Increase activity slowly    Complete by:  As directed             Medication List    STOP taking these medications        aspirin 81 MG tablet  Replaced by:  aspirin 325 MG EC tablet      TAKE these medications        acetaminophen 325 MG tablet  Commonly known as:  TYLENOL  Take 2 tablets (650 mg total) by mouth every 6 (six) hours as needed for mild pain (or Fever >/= 101).     alendronate 70 MG tablet  Commonly known as:  FOSAMAX  Take 1 tablet (70 mg total) by mouth every 7 (seven)  days. Take with a full glass of water on an empty stomach.     aspirin 325 MG EC tablet  Take 1 tablet (325 mg total) by mouth daily with breakfast.     calcium-vitamin D 500-200 MG-UNIT Tabs  Commonly known as:  OSCAL WITH D  TAKE (1) TABLET BY MOUTH (3) TIMES DAILY WITH MEALS.     diltiazem 120 MG 24 hr capsule  Commonly known as:  CARDIZEM CD  TAKE ONE CAPSULE DAILY FOR BLOOD PRESSURE.     FLUoxetine 20 MG capsule  Commonly known as:   PROZAC  TAKE 1 CAPSULE BY MOUTH ONCE A DAY.     HYDROcodone-acetaminophen 5-325 MG per tablet  Commonly known as:  NORCO/VICODIN  Take 1-2 tablets by mouth every 6 (six) hours as needed for moderate pain.     levETIRAcetam 500 MG tablet  Commonly known as:  KEPPRA  TAKE 1 TABLET BY MOUTH TWICE A DAY AS DIRECTED.     lovastatin 20 MG tablet  Commonly known as:  MEVACOR  Take 1 tablet (20 mg total) by mouth at bedtime.     mirtazapine 15 MG tablet  Commonly known as:  REMERON  Take 7.5 mg by mouth at bedtime.     multivitamin tablet  Take 1 tablet by mouth daily.     pravastatin 20 MG tablet  Commonly known as:  PRAVACHOL  Take 1 tablet (20 mg total) by mouth daily at 6 PM.       No Known Allergies    The results of significant diagnostics from this hospitalization (including imaging, microbiology, ancillary and laboratory) are listed below for reference.    Significant Diagnostic Studies: Pelvis Portable  11/19/2014   CLINICAL DATA:  Left total hip replacement  EXAM: PORTABLE PELVIS 1-2 VIEWS  COMPARISON:  None.  FINDINGS: Bipolar left hip hemiarthroplasty. The prosthesis is located and there is no periprosthetic fracture. The tip of the femoral stem is noted to be eccentric within the femoral diaphysis.  Remote intertrochanteric, and possibly subtrochanteric, right femur fracture with complete healing. There is prominent heterotopic ossification about the lesser trochanteric fragment, but not bridging the hip joint. Visible portions of the associated prosthesis are intact. The visible pelvis is intact.  IMPRESSION: No acute findings related to new left hip arthroplasty.   Electronically Signed   By: Marnee SpringJonathon  Watts M.D.   On: 11/19/2014 11:59   Dg Chest Port 1 View  11/18/2014   CLINICAL DATA:  Knee pain secondary to a fall this morning. Pre operative respiratory exam. Acute left hip fracture.  EXAM: PORTABLE CHEST - 1 VIEW  COMPARISON:  04/04/2012  FINDINGS: Heart size and  pulmonary vascularity are normal and the lungs are clear. No acute osseous abnormality. Severe glenohumeral joint arthritis bilaterally.  IMPRESSION: No acute abnormalities.   Electronically Signed   By: Francene BoyersJames  Maxwell M.D.   On: 11/18/2014 10:17   Dg Knee Complete 4 Views Left  11/18/2014   CLINICAL DATA:  Acute left knee pain after fall at home today. Initial encounter.  EXAM: LEFT KNEE - COMPLETE 4+ VIEW  COMPARISON:  None.  FINDINGS: There is no evidence of fracture, dislocation, or joint effusion. Severe narrowing of medial joint space with osteophyte formation is noted. Mild narrowing is noted laterally, with moderate narrowing of patellofemoral space. Soft tissues are unremarkable.  IMPRESSION: Severe degenerative joint disease is noted. No acute abnormality seen in the left knee.   Electronically Signed   By: Lupita RaiderJames  Green Jr,  M.D.   On: 11/18/2014 09:29   Dg Hip Unilat With Pelvis 2-3 Views Left  11/18/2014   CLINICAL DATA:  Fall at home with left knee pain. Initial encounter.  EXAM: LEFT HIP (WITH PELVIS) 2-3 VIEWS  COMPARISON:  None.  FINDINGS: There is a left femoral neck fracture with varus angulation. The left femoral head remains located. No evidence of acute pelvic ring fracture.  Remote intertrochanteric right femur fracture status post ORIF. There is prominent heterotopic ossification contiguous with the distracted lesser trochanter fragment.  Marked osteopenia.  IMPRESSION: Displaced left femoral neck fracture.   Electronically Signed   By: Marnee Spring M.D.   On: 11/18/2014 09:29    Microbiology: Recent Results (from the past 240 hour(s))  Surgical pcr screen     Status: None   Collection Time: 11/19/14 12:01 AM  Result Value Ref Range Status   MRSA, PCR NEGATIVE NEGATIVE Final   Staphylococcus aureus NEGATIVE NEGATIVE Final    Comment:        The Xpert SA Assay (FDA approved for NASAL specimens in patients over 32 years of age), is one component of a comprehensive  surveillance program.  Test performance has been validated by [facility] for patients greater than or equal to 65 year old. It is not intended to diagnose infection nor to guide or monitor treatment.      Labs: Basic Metabolic Panel:  Recent Labs Lab 11/18/14 0936 11/20/14 0627 11/21/14 0614  NA 139 141 140  K 4.2 4.1 3.7  CL 104 104 106  CO2 32 33* 29  GLUCOSE 99 106* 95  BUN CREATININE 0.60 0.64 0.50  CALCIUM 8.4 8.1* 7.8*   Liver Function Tests: No results for input(s): AST, ALT, ALKPHOS, BILITOT, PROT, ALBUMIN in the last 168 hours. No results for input(s): LIPASE, AMYLASE in the last 168 hours. No results for input(s): AMMONIA in the last 168 hours. CBC:  Recent Labs Lab 11/18/14 0936 11/20/14 0627 11/21/14 0614  WBC 6.6 7.0 6.8  NEUTROABS 5.1  --   --   HGB 12.3 9.8* 9.7*  HCT 37.8 30.1* 29.2*  MCV 94.5 94.7 93.9  PLT 168 101* 111*   Cardiac Enzymes: No results for input(s): CKTOTAL, CKMB, CKMBINDEX, TROPONINI in the last 168 hours. BNP: BNP (last 3 results) No results for input(s): BNP in the last 8760 hours.  ProBNP (last 3 results)  Recent Labs  01/03/14 1052  PROBNP 398.2    CBG: No results for input(s): GLUCAP in the last 168 hours.     SignedChaya Jan  [facility] Pager: [phone] 11/21/2014, 12:18 PM

## 2014-11-21 NOTE — Social Work (Signed)
CSW contacted [facility] once family decided that they wanted that facility. Because it was after 4pm, facility was not able to accept patient due to lack of availability of medications.  [facility] had extended a bed offer earlier in the day that the family had previously declined.  [facility] still had [facility], but family did not [facility]. CSW called patient's son, Mr. Alejandra Cruz, and explained that once patient is dc'd the patient may be responsible for additional days in the hospital and insurance may not cover it.  Son stated this did not make since patient has insurance. CSW encouraged family to contact insurance provider themselves. CSW spoke with RN about greivance form for family to sign to decline discharge from hospital.    Weekday CSW will follow until discharge.  Alejandra Cruz MSW, LCSW [phone]

## 2014-11-22 ENCOUNTER — Inpatient Hospital Stay
Admission: RE | Admit: 2014-11-22 | Discharge: 2022-10-29 | Disposition: A | Discharge status: Skilled Nursing Facility | Payer: Medicare Other | Source: Ambulatory Visit | Attending: Internal Medicine | Admitting: Internal Medicine

## 2014-11-22 ENCOUNTER — Inpatient Hospital Stay (HOSPITAL_COMMUNITY): Payer: Medicare Other

## 2014-11-22 ENCOUNTER — Encounter (HOSPITAL_COMMUNITY): Payer: Self-pay | Admitting: Orthopedic Surgery

## 2014-11-22 DIAGNOSIS — N63 Unspecified lump in unspecified breast: Secondary | ICD-10-CM

## 2014-11-22 DIAGNOSIS — M79602 Pain in left arm: Secondary | ICD-10-CM

## 2014-11-22 DIAGNOSIS — IMO0002 Reserved for concepts with insufficient information to code with codable children: Secondary | ICD-10-CM

## 2014-11-22 DIAGNOSIS — R1084 Generalized abdominal pain: Secondary | ICD-10-CM

## 2014-11-22 DIAGNOSIS — R52 Pain, unspecified: Secondary | ICD-10-CM

## 2014-11-22 DIAGNOSIS — M79642 Pain in left hand: Secondary | ICD-10-CM

## 2014-11-22 DIAGNOSIS — R609 Edema, unspecified: Secondary | ICD-10-CM

## 2014-11-22 DIAGNOSIS — R229 Localized swelling, mass and lump, unspecified: Secondary | ICD-10-CM

## 2014-11-22 DIAGNOSIS — W19XXXA Unspecified fall, initial encounter: Principal | ICD-10-CM

## 2014-11-22 DIAGNOSIS — M79671 Pain in right foot: Secondary | ICD-10-CM

## 2014-11-22 DIAGNOSIS — I82402 Acute embolism and thrombosis of unspecified deep veins of left lower extremity: Secondary | ICD-10-CM

## 2014-11-22 DIAGNOSIS — Z1231 Encounter for screening mammogram for malignant neoplasm of breast: Secondary | ICD-10-CM

## 2014-11-22 LAB — CBC
HEMATOCRIT: 28.2 % — AB (ref 36.0–46.0)
Hemoglobin: 9.3 g/dL — ABNORMAL LOW (ref 12.0–15.0)
MCH: 30.9 pg (ref 26.0–34.0)
MCHC: 33 g/dL (ref 30.0–36.0)
MCV: 93.7 fL (ref 78.0–100.0)
Platelets: 132 10*3/uL — ABNORMAL LOW (ref 150–400)
RBC: 3.01 MIL/uL — ABNORMAL LOW (ref 3.87–5.11)
RDW: 14.9 % (ref 11.5–15.5)
WBC: 7.1 10*3/uL (ref 4.0–10.5)

## 2014-11-22 NOTE — Progress Notes (Signed)
UR chart review completed.  

## 2014-11-22 NOTE — Progress Notes (Signed)
Dr. Ardyth HarpsHernandez made aware of the Chest xray report that was available.

## 2014-11-22 NOTE — Progress Notes (Signed)
[facility] facility approx. 11:00.  Alejandra Cruz verbalized understanding.  The patient wanted to eat lunch prior to transport so I agreed.  The patient was then transported approx 3 pm via bed [facility] with packet and belongings.  The patient was in stable condition at transfer.  Alejandra Cruz was dressed in her clothes.

## 2014-11-22 NOTE — Clinical Social Work Placement (Signed)
Clinical Social Work Department CLINICAL SOCIAL WORK PLACEMENT NOTE 11/22/2014  Patient:  Guerry BruinMILLER,Sabrinia A  Account Number:  [id number] Admit date:  11/18/2014  Clinical Social Worker:  Derenda FennelKARA Kairen Hallinan, LCSW  Date/time:  11/22/2014 10:40 AM  Clinical Social Work is seeking post-discharge placement for this patient at the following level of care:   SKILLED NURSING   (*CSW will update this form in Epic as items are completed)   11/22/2014  Patient/family provided with [facility] Department of Clinical Social Work's list of facilities offering this level of care within the geographic area requested by the patient (or if unable, by the patient's family).  11/22/2014  Patient/family informed of their freedom to choose among providers that offer the needed level of care, that participate in Medicare, Medicaid or managed care program needed by the patient, have an available bed and are willing to accept the patient.  11/22/2014  Patient/family informed of [facility]' ownership interest in [facility], as well as of the fact that they are under no obligation to receive care at this facility.  PASARR submitted to EDS on  PASARR number received on   FL2 transmitted to all facilities in geographic area requested by pt/family on  11/22/2014 FL2 transmitted to all facilities within larger geographic area on   Patient informed that his/her managed care company has contracts with or will negotiate with  certain facilities, including the following:     Patient/family informed of bed offers received:  11/22/2014 [facility] Physician recommends and patient chooses bed at    Patient to be transferred [facility] on  11/22/2014 Patient to be transferred to facility by RN Patient and family notified of transfer on 11/22/2014 Name of family member notified:  Gaspar Garbelfred- son  The following physician request were entered in Epic:   Additional  Comments: Pt has existing pasarr number.  Derenda FennelKara Lando Alcalde, [place]CS[place] [phone]

## 2014-11-22 NOTE — Progress Notes (Addendum)
Left hip bipolar 11/19/2014  Depuy [place] Basic fracture stem   Remove staples pod 14  WBAT   F/U 1 MONTH   325MG  ASPIRIN X 1 MONTH

## 2014-11-22 NOTE — Clinical Social Work Note (Addendum)
CSW followed up with pt's son, Gaspar Garbelfred with [facility] which was [facility] this morning. He accepts and facility notified. Pt also agreeable. No changes from yesterday's d/c summary per MD. Pt to transfer with RN.  Derenda FennelKara Jammie Troup, [place] [phone]

## 2014-11-24 ENCOUNTER — Non-Acute Institutional Stay (SKILLED_NURSING_FACILITY): Payer: Medicare Other | Admitting: Internal Medicine

## 2014-11-24 DIAGNOSIS — R296 Repeated falls: Secondary | ICD-10-CM

## 2014-11-24 DIAGNOSIS — M810 Age-related osteoporosis without current pathological fracture: Secondary | ICD-10-CM

## 2014-11-24 DIAGNOSIS — E785 Hyperlipidemia, unspecified: Secondary | ICD-10-CM

## 2014-11-24 DIAGNOSIS — S72002D Fracture of unspecified part of neck of left femur, subsequent encounter for closed fracture with routine healing: Secondary | ICD-10-CM

## 2014-11-26 ENCOUNTER — Other Ambulatory Visit: Payer: Self-pay

## 2014-11-26 MED ORDER — HYDROCODONE-ACETAMINOPHEN 5-325 MG PO TABS: ORAL_TABLET | ORAL | Status: DC

## 2014-11-29 ENCOUNTER — Encounter (HOSPITAL_COMMUNITY)
Admission: RE | Admit: 2014-11-29 | Discharge: 2014-11-29 | Disposition: A | Discharge status: Home / Self Care | Payer: Medicare Other | Source: Skilled Nursing Facility | Attending: Internal Medicine | Admitting: Internal Medicine

## 2014-11-29 LAB — COMPREHENSIVE METABOLIC PANEL
ALBUMIN: 2.2 g/dL — AB (ref 3.5–5.2)
ALT: 33 U/L (ref 0–35)
AST: 29 U/L (ref 0–37)
Alkaline Phosphatase: 66 U/L (ref 39–117)
Anion gap: 5 (ref 5–15)
BILIRUBIN TOTAL: 0.4 mg/dL (ref 0.3–1.2)
BUN: 20 mg/dL (ref 6–23)
CALCIUM: 7.7 mg/dL — AB (ref 8.4–10.5)
CO2: 31 mmol/L (ref 19–32)
Chloride: 105 mmol/L (ref 96–112)
Creatinine, Ser: 0.63 mg/dL (ref 0.50–1.10)
GFR calc Af Amer: >90 mL/min (ref >90)
GFR, EST NON AFRICAN AMERICAN: 83 mL/min — AB (ref >90)
Glucose, Bld: 90 mg/dL (ref 70–99)
Potassium: 4 mmol/L (ref 3.5–5.1)
Sodium: 141 mmol/L (ref 135–145)
Total Protein: 5.1 g/dL — ABNORMAL LOW (ref 6.0–8.3)

## 2014-11-29 LAB — CBC
HCT: 24.6 % — ABNORMAL LOW (ref 36.0–46.0)
HEMOGLOBIN: 8 g/dL — AB (ref 12.0–15.0)
MCH: 30.9 pg (ref 26.0–34.0)
MCHC: 32.5 g/dL (ref 30.0–36.0)
MCV: 95 fL (ref 78.0–100.0)
Platelets: 349 10*3/uL (ref 150–400)
RBC: 2.59 MIL/uL — AB (ref 3.87–5.11)
RDW: 15.5 % (ref 11.5–15.5)
WBC: 6.4 10*3/uL (ref 4.0–10.5)

## 2014-11-29 NOTE — Progress Notes (Addendum)
Patient ID: Alejandra Cruz, female   DOB: 08/01/1933, 81 y.o.   MRN: [id number]                HISTORY & PHYSICAL  DATE:  11/24/2014            FACILITY: [facility]     LEVEL OF CARE:   SNF   CHIEF COMPLAINT:  Admission to SNF, post stay at [facility], 11/18/2014 through 11/21/2014.    HISTORY OF PRESENT ILLNESS:   This is a patient who apparently lives in her own apartment.  She has stairs to go into the apartment.  She got up in the middle of the night, did not use her walker, lost her balance and fell.  She suffered a left hip fracture.  She underwent a left partial hip replacement by Dr. Romeo Apple on 11/18/2014.  She tolerated the procedure fairly well.  I do not see any particular postoperative issues listed.    Her hemoglobin was 12.3 on admission, 9.3 on discharge.  Chemistry panels were normal.  She is not a diabetic.    Per discussing things with the patient and also per review of Dr. Anthony Sar notes in [facility], it would appear that this patient has a history of multiple falls.  There is a note from Dr. Lodema Hong referring to this issue on 11/12/2014.  She was referred to Physical Therapy.  The patient tells me that she may have had as many as 20 falls since the beginning of January.  She is not able to give me any sense of premorbid issues including no palpitations, no syncope, no chest pain.  She simply loses her balance and falls.  She does not describe vertigo.  She has a history of seizures, but has not had a seizure for many years.  She is supposed to be using a walker but, once again, did not use this on the way to the bathroom the night she fell.    PAST MEDICAL HISTORY/PROBLEM LIST:                                Hyperlipidemia.    Hypertension.    Seizure disorder.    Restless legs syndrome.    Gastroesophageal reflux disease.    History of Helicobacter pylori, treated in 2010.    History of an esophageal web, dilated in 2010.    History of  diverticulosis.    Osteoporosis.      Left hip fracture, previously noted.     PAST SURGICAL HISTORY:                 Vesicovaginal fistula closure in 1966.    Colonoscopy in October 2010.    Endoscopy in 2010.    Abdominal hysterectomy.    ORIF in June 2013.     Left hip partial arthroplasty on 11/19/2014.      CURRENT MEDICATIONS:  Discharge medications include:         Enteric-coated aspirin 325 daily with breakfast.     Tylenol 650 q.6 p.r.n.       Fosamax 70 weekly.       Diltiazem 120 q.d.    Prozac 20 q.d.       Keppra 500 b.i.d.       Mevacor 20 q.d.      Remeron 7.5 q.d.         Pravachol 20 mg q.d.  Multivitamin q.d.      SOCIAL HISTORY:                   HOUSING:  The patient tells me that she lives on her own.   She lives in some form of complex.  Apparently has 15 stairs to get in.   FUNCTIONAL STATUS:  She is supposed to be using a walker.  Her functional status is unclear.     FAMILY HISTORY:                     MOTHER:  Cancer and diabetes in her mother.   Deceased.     FATHER:  Diabetes in her father.   Deceased.     SIBLINGS:  She has one brother who is alive with Parkinson's disease.     REVIEW OF SYSTEMS:            CHEST/RESPIRATORY:  No shortness of breath.   CARDIAC:  No chest pain.  No palpitations.  No syncope or presyncope.   GI:  Has problems with constipation.    GU:  No dysuria.    MUSCULOSKELETAL:  She has pain in her left hip.   NEUROLOGICAL:  She has gait problems.  I am not exactly sure what this looks like.  She denies sensory loss or focal weakness.    PHYSICAL EXAMINATION:   VITAL SIGNS:   O2 SATURATIONS:  95% on room air.    BLOOD PRESSURE:  170/52.    This was noted at 94/56 last night.   PULSE:  90.     RESPIRATIONS:  18.     TEMPERATURE:  98.2.     GENERAL APPEARANCE:   The patient is depressed.  Somewhat flat, sad-looking face.    HEENT:     MOUTH/THROAT:   She is edentulous.   There is a coating on  her tongue.  I do not believe this is thrush.   CHEST/RESPIRATORY:  Crackles in the right lower lobe greater than left.  There is no wheezing.  Work of breathing appears to be normal.   CARDIOVASCULAR:   CARDIAC:  Heart sounds are normal.  Benign-sounding 1/6 systolic murmur.  There are no carotid bruits.   GASTROINTESTINAL:   LIVER/SPLEEN/KIDNEYS:   No liver, no spleen.  No tenderness.   GENITOURINARY:    BLADDER:  Not enlarged or tender.  There is no CVA tenderness.   CIRCULATION:   EDEMA/VARICOSITIES:  No edema.    VASCULAR:   ARTERIAL:  Peripheral pulses are palpable in the foot.    SKIN:   INSPECTION:  Her left hip incision looks fine.   There is no evidence of infection here.   NEUROLOGICAL:   MOTOR/TONE:  She has no pronator drift.  There is increased tone in the left arm greater than right.  There may be some cogwheeling in the right arm.  Tone is increased in the right leg.      DEEP TENDON REFLEXES:  Reflexes are normal in the upper extremities, 1+ at the left knee jerk, a flicker at the right.  Both ankle jerks are unobtainable.  Toes are downgoing.  SENSATION/STRENGTH:  She has significant weakness in the right hip flexor at 3+/5.   PSYCHIATRIC:   MENTAL STATUS:  I found the patient to be somewhat vague.  She states she is depressed.  Would not spontaneously generate much information for me.    ASSESSMENT/PLAN:  Status post left hip fracture with a partial left hip replacement.  I believe she has a history of a left hip pinning.  In fact, she may have been in the building in 2013.    Apparently a profound history of falling.  If it is true that she has had 20 falls since the beginning of January, this is truly a medical urgency.  Looking at her at the bedside, there are some elements of increased tone, perhaps early parkinsonism.  I would like another look at this.    Osteoporosis.  Together with #2 makes her a very high fracture risk, even for  recurrent fractures.    She will need to be checked for orthostatic hypotension when it is possible to do this.    MRI from 2010 showing microvascular ischemia in the white matter.    Depression.  The patient states she has chronic depression.  This has been significant in the past.  She tells me that she has been on an antidepressant for years and, indeed, she is on Prozac 20 q.d.    I make it a general rule to allow for some degree of situational depression here, given her fracture, which I think she is aware is an independence-threatening/home-threatening event.    Hyperlipidemia.  Comes to us on two statins, Mevacor and Pravachol.  This will need to be adjusted if it was not picked up on admission.    Hypertension.  We will follow while she is here.    Inadequate DVT prophylaxis.  This patient is at high risk.  I do not think an adult-strength aspirin is going to cover this.  I cannot see whether the patient was actually on aspirin before this admission.  I am leaning towards Xarelto, in spite of the fall risk.  I have started her on 10 mg a day.  I have stopped the aspirin.    She will need orthostatic blood pressures checked.  Lab work will be checked next week.  I will review her when I am next in the building.

## 2014-11-30 ENCOUNTER — Non-Acute Institutional Stay (SKILLED_NURSING_FACILITY): Payer: Medicare Other | Admitting: Internal Medicine

## 2014-11-30 ENCOUNTER — Encounter: Payer: Self-pay | Admitting: Internal Medicine

## 2014-11-30 DIAGNOSIS — D509 Iron deficiency anemia, unspecified: Secondary | ICD-10-CM | POA: Insufficient documentation

## 2014-11-30 DIAGNOSIS — S72002D Fracture of unspecified part of neck of left femur, subsequent encounter for closed fracture with routine healing: Secondary | ICD-10-CM

## 2014-11-30 NOTE — Progress Notes (Signed)
Patient ID: Alejandra Cruz, female   DOB: 1932-11-01, 81 y.o.   MRN: [id number]   FACILITY: [facility]     LEVEL OF CARE:   SNF  This is an acute visit   CHIEF COMPLAINT:  Acute visit secondary to anemia.    HISTORY OF PRESENT ILLNESS:   This is a patient who apparently lives in her own apartment.  She has stairs to go into the apartment.  She got up in the middle of the night, did not use her walker, lost her balance and fell.  She suffered a left hip fracture.  She underwent a left partial hip replacement by Dr. Romeo Apple on 11/18/2014.  She tolerated the procedure fairly well.  I do not see any particular postoperative issues listed.    Her hemoglobin was 12.3 on admission, 9.3 on discharge.  Chemistry panels were normal.  She is not a diabetic Updated lab work done on February 22 shows the hemoglobin is now 8.0 this is normocytic at 95.0.  Other laboratories appear to be fairly unremarkable her albumin however is 2.2.        PAST MEDICAL HISTORY/PROBLEM LIST:                                Hyperlipidemia.    Hypertension.    Seizure disorder.    Restless legs syndrome.    Gastroesophageal reflux disease.    History of Helicobacter pylori, treated in 2010.    History of an esophageal web, dilated in 2010.    History of diverticulosis.    Osteoporosis.      Left hip fracture, previously noted.     PAST SURGICAL HISTORY:                 Vesicovaginal fistula closure in 1966.    Colonoscopy in October 2010.    Endoscopy in 2010.    Abdominal hysterectomy.    ORIF in June 2013.     Left hip partial arthroplasty on 11/19/2014.      CURRENT MEDICATIONS:  Discharge medications include:         Xarelto 10 mg by mouth daily.     Tylenol 650 q.6 p.r.n.       Fosamax 70 weekly.       Diltiazem 120 q.d.    Prozac 20 q.d.       Keppra 500 b.i.d.       Mevacor 20 q.d.      Remeron 7.5 q.d.         Pravachol 20 mg q.d.          Multivitamin q.d.       SOCIAL HISTORY:                   HOUSING:  The patient she lives on her own.   She lives in some form of complex.  Apparently has 15 stairs to get in.   FUNCTIONAL STATUS:  She is supposed to be using a walker.  Her functional status is unclear.     FAMILY HISTORY:                     MOTHER:  Cancer and diabetes in her mother.   Deceased.     FATHER:  Diabetes in her father.   Deceased.     SIBLINGS:  She has one brother who is alive with Parkinson's disease.  REVIEW OF SYSTEMS In general no complaints of fever or chills Skin-surgical site appears to be healing unremarkably staples in place no sign of infection:            CHEST/RESPIRATORY:  No shortness of breath.   CARDIAC:  No chest pain.  No palpitations.  No syncope or presyncope.   GI:  Has problems with constipation. 1 on iron    GU:  No dysuria.    MUSCULOSKELETAL:  She has pain in her left hip.   NEUROLOGICAL:  She has gait problems. .  She denies sensory loss or focal weakness.    PHYSICAL EXAMINATION:   VITAL SIGNS:    Temperature 98.1 pulse 75 respirations 18 blood pressure 123/63.     GENERAL APPEARANCE:   Pleasant elderly female in no distress    HEENT:     MOUTH/THROAT:   She is edentulous.     CHEST/RESPIRATORY:  clear to auscultation.  There is no wheezing.  Work of breathing appears to be normal.   CARDIOVASCULAR:   CARDIAC:  Heart sounds are normal.  Benign-sounding 1/6 systolic murmur.  There are no carotid bruits--regular rate and rhythm.   GASTROINTESTINAL:    Abdomen is soft nontender with positive bowel sounds    Rectal-I did do a digital exam to test for occult bleeding this did guaiac negative     EDEMA/VARICOSITIES:  No edema.    VASCULAR:   ARTERIAL:  Peripheral pulses are palpable in the foot.    SKIN:   INSPECTION:  Her left hip incision looks fine.   There is no evidence of infection here staples are in place.   Musculoskeletal-is able to pivot on transfers but has significant lower  extremity weakness I did not note any deformities.  Labs.  11/29/2014.  WBC 6.4 hemoglobin 8.0 platelets 349.  Sodium 141 potassium 4 BUN 20 creatinine 0.63.  Albumin 2.2 otherwise liver function tests within normal limits    Assessment plan.  #1-anemia-her hemoglobin has trended down this is likely postop blood loss anemia we will start her on iron 325 mg-twice a day-Will also add MiraLAX for concerns of constipation. Also will continue quite stools 3  Update CBC first laboratory Day next week  #2-history of left hip fracture with repair this appears to be stable her anticoagulation has been switched to Xarelto-clinically this appears stable although appears she will need extensive therapy with a history weakness and recurrent falls.  [id number]  She is on Fosamax as well for suspected osteoporosis.  [id number]     ASSESSMENT/PLAN:                           Status post left hip fracture with a partial left hip replacement.  I believe she has a history of a left hip pinning.  In fact, she may have been in the building in 2013.    Apparently a profound history of falling.  If it is true that she has had 20 falls since the beginning of January, this is truly a medical urgency.  Looking at her at the bedside, there are some elements of increased tone, perhaps early parkinsonism.  I would like another look at this.    Osteoporosis.  Together with #2 makes her a very high fracture risk, even for recurrent fractures.    She will need to be checked for orthostatic hypotension when it is possible to do this.    MRI from 2010 showing  microvascular ischemia in the white matter.    Depression.  The patient states she has chronic depression.  This has been significant in the past.  She tells me that she has been on an antidepressant for years and, indeed, she is on Prozac 20 q.d.    I make it a general rule to allow for some degree of situational depression here, given her fracture, which  I think she is aware is an independence-threatening/home-threatening event.    Hyperlipidemia.  Comes to [place] on two statins, Mevacor and Pravachol.  This will need to be adjusted if it was not picked up on admission.    Hypertension.  We will follow while she is here.    Inadequate DVT prophylaxis.  This patient is at high risk.  I do not think an adult-strength aspirin is going to cover this.  I cannot see whether the patient was actually on aspirin before this admission.  I am leaning towards Xarelto, in spite of the fall risk.  I have started her on 10 mg a day.  I have stopped the aspirin.    Marland Kitchen

## 2014-12-02 ENCOUNTER — Other Ambulatory Visit (HOSPITAL_COMMUNITY)
Admission: RE | Admit: 2014-12-02 | Discharge: 2014-12-02 | Disposition: A | Discharge status: Home / Self Care | Payer: Medicare Other | Source: Skilled Nursing Facility | Attending: Internal Medicine | Admitting: Internal Medicine

## 2014-12-02 LAB — OCCULT BLOOD X 1 CARD TO LAB, STOOL: Fecal Occult Bld: NEGATIVE

## 2014-12-03 ENCOUNTER — Inpatient Hospital Stay (HOSPITAL_COMMUNITY): Payer: Medicare Other | Attending: Internal Medicine

## 2014-12-06 ENCOUNTER — Encounter (HOSPITAL_COMMUNITY)
Admission: RE | Admit: 2014-12-06 | Discharge: 2014-12-06 | Disposition: A | Discharge status: Home / Self Care | Payer: Medicare Other | Source: Skilled Nursing Facility | Attending: Internal Medicine | Admitting: Internal Medicine

## 2014-12-06 LAB — CBC WITH DIFFERENTIAL/PLATELET
BASOS ABS: 0 10*3/uL (ref 0.0–0.1)
BASOS PCT: 0 % (ref 0–1)
Eosinophils Absolute: 0.2 10*3/uL (ref 0.0–0.7)
Eosinophils Relative: 3 % (ref 0–5)
HEMATOCRIT: 25.1 % — AB (ref 36.0–46.0)
HEMOGLOBIN: 7.9 g/dL — AB (ref 12.0–15.0)
Lymphocytes Relative: 22 % (ref 12–46)
Lymphs Abs: 1.5 10*3/uL (ref 0.7–4.0)
MCH: 29.3 pg (ref 26.0–34.0)
MCHC: 31.5 g/dL (ref 30.0–36.0)
MCV: 93 fL (ref 78.0–100.0)
MONOS PCT: 24 % — AB (ref 3–12)
Monocytes Absolute: 1.6 10*3/uL — ABNORMAL HIGH (ref 0.1–1.0)
NEUTROS ABS: 3.5 10*3/uL (ref 1.7–7.7)
NEUTROS PCT: 51 % (ref 43–77)
PLATELETS: 447 10*3/uL — AB (ref 150–400)
RBC: 2.7 MIL/uL — ABNORMAL LOW (ref 3.87–5.11)
RDW: 15.3 % (ref 11.5–15.5)
WBC: 6.8 10*3/uL (ref 4.0–10.5)

## 2014-12-07 ENCOUNTER — Non-Acute Institutional Stay (SKILLED_NURSING_FACILITY): Payer: Medicare Other | Admitting: Internal Medicine

## 2014-12-07 ENCOUNTER — Encounter: Payer: Self-pay | Admitting: Internal Medicine

## 2014-12-07 DIAGNOSIS — D509 Iron deficiency anemia, unspecified: Secondary | ICD-10-CM | POA: Diagnosis not present

## 2014-12-07 NOTE — Progress Notes (Signed)
Patient ID: Alejandra Cruz, female   DOB: [birth year], 81 y.o.   MRN: [id number]   ACILITY: [facility]     LEVEL OF CARE:   SNF  This is an acute visit   CHIEF COMPLAINT:  Acute visit follow-up anemia.    HISTORY OF PRESENT ILLNESS:   This is a patient who apparently lives in her own apartment.  She has stairs to go into the apartment.  She got up in the middle of the night, did not use her walker, lost her balance and fell.  She suffered a left hip fracture.  She underwent a left partial hip replacement by Dr. Romeo AppleHarrison on 11/18/2014.  She tolerated the procedure fairly well.  I do not see any particular postoperative issues listed.    Her hemoglobin was 12.3 on admission, 9.3 on discharge.  Chemistry panels were normal.  She is not a diabetic Updated lab work done on February 22 showed the hemoglobin is now 8.0 this was normocytic at 95.0.  When I saw her last week I did test for occult blood which was negative-a subsequent test is also been negative.  She has been started on iron as of last week-.  Updated lab done 2-29-16hows hemoglobin is 7.9 this continues to be normocytic at 93.0.  .        PAST MEDICAL HISTORY/PROBLEM LIST:                                Hyperlipidemia.    Hypertension.    Seizure disorder.    Restless legs syndrome.    Gastroesophageal reflux disease.    History of Helicobacter pylori, treated in 2010.    History of an esophageal web, dilated in 2010.    History of diverticulosis.    Osteoporosis.      Left hip fracture, previously noted.     PAST SURGICAL HISTORY:                 Vesicovaginal fistula closure in 1966.    Colonoscopy in October 2010.    Endoscopy in 2010.    Abdominal hysterectomy.    ORIF in June 2013.     Left hip partial arthroplasty on 11/19/2014.      CURRENT MEDICATIONS:  Discharge medications include:         Xarelto 10 mg by mouth daily.     Tylenol 650 q.6 p.r.n.       Fosamax 70 weekly.        Diltiazem 120 q.d.    Prozac 20 q.d.       Keppra 500 b.i.d.       Mevacor 20 q.d.      Remeron 7.5 q.d.         Pravachol 20 mg q.d.          Multivitamin q.d  Iron 325 mg twice a day.      SOCIAL HISTORY:                   HOUSING:  The patient she lives on her own.   She lives in some form of complex.  Apparently has 15 stairs to get in.   FUNCTIONAL STATUS:  She is supposed to be using a walker.  Her functional status is unclear.     FAMILY HISTORY:  MOTHER:  Cancer and diabetes in her mother.   Deceased.     FATHER:  Diabetes in her father.   Deceased.     SIBLINGS:  She has one brother who is alive with Parkinson's disease.     REVIEW OF SYSTEMS In general no complaints of fever or chills Skin-surgical site appears to be healing unremarkably staples in place no sign of infection:            CHEST/RESPIRATORY:  No shortness of breath.   CARDIAC:  No chest pain.  No palpitations.  No syncope or presyncope.   GI:  Has problems with constipation. 1 on iron    GU:  No dysuria.    MUSCULOSKELETAL:  She has pain in her left hip.   NEUROLOGICAL:  She has gait problems. .  She denies sensory loss or focal weakness.    PHYSICAL EXAMINATION:   VITAL SIGNS:    Denture 97.9 pulse 76 respirations 20 blood pressure 106/60.      GENERAL APPEARANCE:   Pleasant elderly female in no distress    HEENT:     MOUTH/THROAT:   She is edentulous.     CHEST/RESPIRATORY:  clear to auscultation.  There is no wheezing.  Work of breathing appears to be normal.   CARDIOVASCULAR:   CARDIAC:  Heart sounds are normal.  Benign-sounding 1/6 systolic murmur.  There are no carotid bruits--regular rate and rhythm.   GASTROINTESTINAL:    Abdomen is soft nontender with positive bowel sounds    Left hip-surgical site appears to be well healed there is dried scaling--no acute tenderness      EDEMA/VARICOSITIES: No increased edema from baseline    VASCULAR:   ARTERIAL:   Peripheral pulses are palpable in the foot.     .  Labs.  12/06/2014.  WBC 6.8 hemoglobin 7.9 platelets 447  11/29/2014.  WBC 6.4 hemoglobin 8.0 platelets 349.  Sodium 141 potassium 4 BUN 20 creatinine 0.63.  Albumin 2.2 otherwise liver function tests within normal limits   Assessment and plan.  #1-anemia-her hemoglobin had beentrending down-this was thought to be postop blood loss anemia most likely-she is on iron-hemoglobin appears stable although was not rally rising would like to give this a bit more time-so far guaic stools have been negative-this will need updating next week This was discussed with Dr. Leanord Hawking via phone.  [id number]

## 2014-12-13 ENCOUNTER — Encounter (HOSPITAL_COMMUNITY)
Admission: RE | Admit: 2014-12-13 | Discharge: 2014-12-13 | Disposition: A | Discharge status: Home / Self Care | Payer: Medicare Other | Source: Skilled Nursing Facility | Attending: Internal Medicine | Admitting: Internal Medicine

## 2014-12-13 LAB — CBC WITH DIFFERENTIAL/PLATELET
BASOS ABS: 0 10*3/uL (ref 0.0–0.1)
Basophils Relative: 1 % (ref 0–1)
Eosinophils Absolute: 0.4 10*3/uL (ref 0.0–0.7)
Eosinophils Relative: 6 % — ABNORMAL HIGH (ref 0–5)
HCT: 26.7 % — ABNORMAL LOW (ref 36.0–46.0)
Hemoglobin: 8.3 g/dL — ABNORMAL LOW (ref 12.0–15.0)
LYMPHS ABS: 2 10*3/uL (ref 0.7–4.0)
Lymphocytes Relative: 32 % (ref 12–46)
MCH: 29 pg (ref 26.0–34.0)
MCHC: 31.1 g/dL (ref 30.0–36.0)
MCV: 93.4 fL (ref 78.0–100.0)
Monocytes Absolute: 1 10*3/uL (ref 0.1–1.0)
Monocytes Relative: 16 % — ABNORMAL HIGH (ref 3–12)
Neutro Abs: 2.9 10*3/uL (ref 1.7–7.7)
Neutrophils Relative %: 46 % (ref 43–77)
Platelets: 359 10*3/uL (ref 150–400)
RBC: 2.86 MIL/uL — AB (ref 3.87–5.11)
RDW: 15.9 % — AB (ref 11.5–15.5)
WBC: 6.2 10*3/uL (ref 4.0–10.5)

## 2014-12-21 ENCOUNTER — Encounter: Payer: Self-pay | Admitting: Orthopedic Surgery

## 2014-12-21 ENCOUNTER — Ambulatory Visit (INDEPENDENT_AMBULATORY_CARE_PROVIDER_SITE_OTHER): Payer: Self-pay | Admitting: Orthopedic Surgery

## 2014-12-21 VITALS — BP 115/58 | Ht 67.0 in | Wt 191.4 lb

## 2014-12-21 DIAGNOSIS — S72002D Fracture of unspecified part of neck of left femur, subsequent encounter for closed fracture with routine healing: Secondary | ICD-10-CM

## 2014-12-21 NOTE — Progress Notes (Signed)
Chief Complaint  Patient presents with  . Follow-up    1 month follow up Left bipolar hip, DOS 11/19/14    The patient is doing well except she's had 2 falls since I saw her last she had x-rays done of her right gamma nail which is a long locked nail in her left bipolar both implants are intact. She is a little bit anemic and the medical doctors are handling that. She's had loss of confidence in ability to walk since she has fallen twice  No abnormal findings on clinical exam she has painless range of motion in both hips. She has some mild peripheral edema which is chronic.  Recommend continued physical therapy and a follow-up in 6 weeks.

## 2014-12-22 NOTE — Addendum Note (Signed)
Addended by: Adella HareBOOTHE, Keelin Sheridan B on: 12/22/2014 03:59 PM   Modules accepted: Orders, Medications

## 2015-01-10 ENCOUNTER — Other Ambulatory Visit: Payer: Self-pay | Admitting: *Deleted

## 2015-01-10 MED ORDER — HYDROCODONE-ACETAMINOPHEN 5-325 MG PO TABS: ORAL_TABLET | ORAL | Status: DC

## 2015-01-10 NOTE — Telephone Encounter (Signed)
[facility] 

## 2015-01-10 NOTE — Telephone Encounter (Signed)
[facility] 

## 2015-02-03 ENCOUNTER — Ambulatory Visit (INDEPENDENT_AMBULATORY_CARE_PROVIDER_SITE_OTHER): Payer: Self-pay | Admitting: Orthopedic Surgery

## 2015-02-03 ENCOUNTER — Encounter: Payer: Self-pay | Admitting: Orthopedic Surgery

## 2015-02-03 VITALS — BP 154/85 | Ht 67.0 in | Wt 191.4 lb

## 2015-02-03 DIAGNOSIS — S72002D Fracture of unspecified part of neck of left femur, subsequent encounter for closed fracture with routine healing: Secondary | ICD-10-CM

## 2015-02-03 DIAGNOSIS — Z96649 Presence of unspecified artificial hip joint: Secondary | ICD-10-CM

## 2015-02-03 DIAGNOSIS — Z9660 Presence of unspecified orthopedic joint implant: Secondary | ICD-10-CM

## 2015-02-03 NOTE — Progress Notes (Signed)
Patient ID: Alejandra Cruz, female   DOB: August 25, 1933, 81 y.o.   MRN: [id number] Chief Complaint  Patient presents with  . Follow-up    6 week follow up Left bipolar hip, DOS 11/19/14    Encounter Diagnoses  Name Primary?  . Fracture of femoral neck, left, closed, with routine healing, subsequent encounter Yes  . S/P hip replacement     BP 154/85 mmHg  Ht 5\' 7"  (1.702 m)  Wt 191 lb 6.4 oz (86.818 kg)  BMI 29.97 kg/m2  No issues at this point with the hip replacement; hip flexion 125. She walks every other day with therapy. She is to advance as tolerated weightbearing as tolerated

## 2015-02-18 ENCOUNTER — Non-Acute Institutional Stay (SKILLED_NURSING_FACILITY): Payer: Medicare Other | Admitting: Internal Medicine

## 2015-02-18 ENCOUNTER — Encounter: Payer: Self-pay | Admitting: Internal Medicine

## 2015-02-18 DIAGNOSIS — R569 Unspecified convulsions: Secondary | ICD-10-CM

## 2015-02-18 DIAGNOSIS — I10 Essential (primary) hypertension: Secondary | ICD-10-CM | POA: Diagnosis not present

## 2015-02-18 DIAGNOSIS — S72002S Fracture of unspecified part of neck of left femur, sequela: Secondary | ICD-10-CM | POA: Diagnosis not present

## 2015-02-18 DIAGNOSIS — D509 Iron deficiency anemia, unspecified: Secondary | ICD-10-CM | POA: Diagnosis not present

## 2015-02-18 NOTE — Progress Notes (Signed)
Patient ID: Alejandra Cruz, female   DOB: [birth year], 81 y.o.   MRN: [id number]              FACILITY: [facility]     LEVEL OF CARE:   SNF  This is a routine visit   CHIEF COMPLAINT:  Medical management of chronic medical conditions including history of left hip fracture status post repair-hypertension-osteoporosis-seizure disorder-anemia-depression.    HISTORY OF PRESENT ILLNESS:   This is a patient who apparently lives in her own apartment.  She has stairs to go into the apartment.  She got up in the middle of the night, did not use her walker, lost her balance and fell.  She suffered a left hip fracture.  She underwent a left partial hip replacement by Dr. Romeo AppleHarrison on 11/18/2014.  She tolerated the procedure fairly well.  I do not see any particular postoperative issues listed.    Her hemoglobin was 12.3 on admission, 9.3 on discharge.  Chemistry panels were normal.  She is not a diabetic Updated lab work showed hemoglobin has dipped down to the 8-7 .9 range-she was started on iron and hemoglobin appears to be trending up most recently 8.3 this will need to be rechecked.  Her other medical issues appear to be quite stable she does have a history of hypertension is on diltiazem recent blood pressures 141/60 900/64 138/76 I do not see consistent low or elevated systolics.  She continues on calcium and Fosamax for suspected osteoporosis with concern for any increased fracture risk.  She continues on Keppra for seizure disorder this has not really been initiated during her stay here.         PAST MEDICAL HISTORY/PROBLEM LIST:                                Hyperlipidemia.    Hypertension.    Seizure disorder.    Restless legs syndrome.    Gastroesophageal reflux disease.    History of Helicobacter pylori, treated in 2010.    History of an esophageal web, dilated in 2010.    History of diverticulosis.    Osteoporosis.      Left hip fracture, previously noted.      PAST SURGICAL HISTORY:                 Vesicovaginal fistula closure in 1966.    Colonoscopy in October 2010.    Endoscopy in 2010.    Abdominal hysterectomy.    ORIF in June 2013.     Left hip partial arthroplasty on 11/19/2014.      CURRENT MEDICATIONS:  Discharge medications include:         Aspirin 81 mg daily.     Tylenol 650 q.6 p.r.n.       Fosamax 70 weekly.       Diltiazem 120 q.d.    Prozac 20 q.d.       Keppra 500 b.i.d.           Remeron 7.5 q.d.         Pravachol 20 mg q.d.          Multivitamin q.d.      SOCIAL HISTORY:                   HOUSING:  The patient she lives on her own.   She lives in some form of complex.  Apparently has 15 stairs  to get in.   FUNCTIONAL STATUS: Probably has used a walker in the past     FAMILY HISTORY:                     MOTHER:  Cancer and diabetes in her mother.   Deceased.     FATHER:  Diabetes in her father.   Deceased.     SIBLINGS:  She has one brother who is alive with Parkinson's disease.     REVIEW OF SYSTEMS In general no complaints of fever or chills Skin-no complaints  The surgical site  healed unremarkably            CHEST/RESPIRATORY:  No shortness of breath.   CARDIAC:  No chest pain.  No palpitations.  No syncope or presyncope.   GI: Has not reported any abdominal discomfort constipation issues nausea or vomiting    GU:  No dysuria.    MUSCULOSKELETAL: .--Does not complain of joint pain this evening   NEUROLOGICAL: --Does not complain of dizziness headache or syncopal-type episodes.    PHYSICAL EXAMINATION:   VITAL SIGNS:    Temperature 98.4 pulse 90 respirations 20 blood pressures as noted above weight is 196.8     GENERAL APPEARANCE:   Pleasant elderly female in no distress --resting comfortably in bed  Her skin is warm and dry surgical site left hip appears to be healed unremarkably there is a well-healed scar   HEENT: She has prescription lenses visual acuity appears grossly intact      MOUTH/THROAT:   She is edentulous.     CHEST/RESPIRATORY:  clear to auscultation.  There is no wheezing.  Work of breathing appears to be normal.   CARDIOVASCULAR:   CARDIAC:  Heart sounds are normal.  Benign-sounding 1/6 systolic murmur.  T-regular rate and rhythm.   GASTROINTESTINAL:    Abdomen is soft nontender with positive bowel sounds      EDEMA/VARICOSITIES:  No edema.    VASCULAR:   ARTERIAL:  Peripheral pulses are palpable in the foot.    SKIN:   INSPECTION:  Her left hip incision looks fine.   There is no evidence of infection here --there is a well-healed surgical scar   Musculoskeletal-is able to move all extremities 4 I did not note any deformities strength appears to be intact limited exam since patient is bed-apparently she has  Been  using a walker at times  Neurologic is grossly intact her speech is clear   .  Labs.  12/13/2014.  WBC 6.2 hemoglobin 8.3 platelets 359.  12/06/2014.  WBC 6.8 hemoglobin 7.9 platelets 447.  11/29/2014.  Sodium 141 potassium 4 BUN 20 creatinine 0.63.  Albumin 2.2 otherwise liver function tests within normal limits  11/29/2014.  WBC 6.4 hemoglobin 8.0 platelets 349.  Sodium 141 potassium 4 BUN 20 creatinine 0.63.  Albumin 2.2 otherwise liver function tests within normal limits    Assessment plan.  #1-anemia-her hemoglobin appears to be trending up she is on iron Will need to recheck this she possibly may not need iron anymore if HGB has returned to baseline again will await lab results   #2-history of left hip fracture with repair this appears to be stable she has seen orthopedics and is weightbearing as tolerated-  She is on Fosamax as well for suspected osteoporosis--.         Hyperlipidemia.  --Continues on a statin recent liver function tests were within normal lasts will update this as well as a lipid  panel  .    Hypertension.  At this point appears stable she is on diltiazem.    I  History of  depression apparently this has been significant the past but has not really been an issue here she is on Prozac appears to be doing quite well with this.    I note she also is on Remeron.    History of seizure disorder there been none to my knowledge during her stay here she continues on Keppra.    [id number]  .

## 2015-02-21 ENCOUNTER — Encounter (HOSPITAL_COMMUNITY)
Admission: RE | Admit: 2015-02-21 | Discharge: 2015-02-21 | Disposition: A | Discharge status: Home / Self Care | Payer: Medicare Other | Source: Skilled Nursing Facility | Attending: Internal Medicine | Admitting: Internal Medicine

## 2015-02-21 DIAGNOSIS — E785 Hyperlipidemia, unspecified: Secondary | ICD-10-CM | POA: Diagnosis not present

## 2015-02-21 DIAGNOSIS — I10 Essential (primary) hypertension: Secondary | ICD-10-CM | POA: Insufficient documentation

## 2015-02-21 LAB — CBC
HCT: 33.3 % — ABNORMAL LOW (ref 36.0–46.0)
Hemoglobin: 10.5 g/dL — ABNORMAL LOW (ref 12.0–15.0)
MCH: 27.9 pg (ref 26.0–34.0)
MCHC: 31.5 g/dL (ref 30.0–36.0)
MCV: 88.3 fL (ref 78.0–100.0)
Platelets: 180 K/uL (ref 150–400)
RBC: 3.77 MIL/uL — ABNORMAL LOW (ref 3.87–5.11)
RDW: 17.4 % — ABNORMAL HIGH (ref 11.5–15.5)
WBC: 3.9 K/uL — ABNORMAL LOW (ref 4.0–10.5)

## 2015-02-21 LAB — BASIC METABOLIC PANEL WITH GFR
Anion gap: 6 (ref 5–15)
BUN: 18 mg/dL (ref 6–20)
CO2: 31 mmol/L (ref 22–32)
Calcium: 7.9 mg/dL — ABNORMAL LOW (ref 8.9–10.3)
Chloride: 108 mmol/L (ref 101–111)
Creatinine, Ser: 0.58 mg/dL (ref 0.44–1.00)
GFR calc Af Amer: >60 mL/min
GFR calc non Af Amer: >60 mL/min
Glucose, Bld: 87 mg/dL (ref 65–99)
Potassium: 3.8 mmol/L (ref 3.5–5.1)
Sodium: 145 mmol/L (ref 135–145)

## 2015-02-21 LAB — LIPID PANEL
CHOL/HDL RATIO: 2.8 ratio
Cholesterol: 123 mg/dL (ref 0–200)
HDL: 44 mg/dL (ref >40)
LDL CALC: 71 mg/dL (ref 0–99)
Triglycerides: 38 mg/dL (ref <150)
VLDL: 8 mg/dL (ref 0–40)

## 2015-03-07 ENCOUNTER — Non-Acute Institutional Stay (SKILLED_NURSING_FACILITY): Payer: Medicare Other | Admitting: Internal Medicine

## 2015-03-07 DIAGNOSIS — M7989 Other specified soft tissue disorders: Secondary | ICD-10-CM

## 2015-03-07 NOTE — Progress Notes (Signed)
Patient ID: Alejandra Cruz, female   DOB: 08-Mar-1933, 81 y.o.   MRN: [id number] Facility [facility]: [facility] Chief complaint left arm swelling. History; this is a patient who is been in the building sincerely part of this year. She had fallen and underwent a left partial hip replacement that this was in February 2016. Vision tells me that she woke up yesterday with edema in her hand making it difficult for her to make a fist. She thinks she may have "slept on it closed  CBC Latest Ref Rng 02/21/2015 12/13/2014 12/06/2014  WBC 4.0 - 10.5 K/uL 3.9(L) 6.2 6.8  Hemoglobin 12.0 - 15.0 g/dL 10.5(L) 8.3(L) 7.9(L)  Hematocrit 36.0 - 46.0 % 33.3(L) 26.7(L) 25.1(L)  Platelets 150 - 400 K/uL 180 359 447(H)   Lab Results  Component Value Date   CREATININE 0.58 02/21/2015   CREATININE 0.63 11/29/2014   CREATININE 0.50 11/21/2014   Review of systems Respiratory not complaining of shortness of breath Cardiac no complaints of chest pain Musculoskeletal concerned about the swelling in her left hand but she does not complain of pain.  Physical examination Respiratory clear entry bilaterally Cardiac heart sounds are normal no murmurs no increase in jugular venous pressure. Left arm and hand. Peripheral pulses aren't palpable. She has no active synovitis. There is some nonpitting swelling in the dorsal aspect of her left hand there is no tenderness. She is able to make it a make a fist albeit slowly. The edema does not appear to go upper arm I see no evidence of a DVT  Impression/plan #1 edema of the left hand. I don't see an obvious cause for this and it may be as the patient states that this is a compressive phenomenon either in bed or in her wheelchair. I'll try to have him have the arm elevated.

## 2015-03-09 ENCOUNTER — Non-Acute Institutional Stay (SKILLED_NURSING_FACILITY): Payer: Medicare Other | Admitting: Internal Medicine

## 2015-03-09 ENCOUNTER — Encounter: Payer: Self-pay | Admitting: Internal Medicine

## 2015-03-09 DIAGNOSIS — R609 Edema, unspecified: Secondary | ICD-10-CM | POA: Diagnosis not present

## 2015-03-09 DIAGNOSIS — R600 Localized edema: Secondary | ICD-10-CM | POA: Insufficient documentation

## 2015-03-09 NOTE — Progress Notes (Signed)
Patient ID: Alejandra Cruz, female   DOB: 1933-01-19, 81 y.o.   MRN: [id number]   ,         Facility [facility]: [facility]  Chief complaint left arm swelling. Follow up  HPI--; this is a patient who has been here since February of this year. She had fallen and underwent a left partial hip replacement that this was in February 2016. Apparently she woke up over the weekend with edema in her hand making it difficult for her to make a fist. She thinks she may have "slept on it closed Dr. Leanord Hawkingobson did assess it on Monday-and wrote orders to elevate the hand as much as possible apparently this has been done although still somewhat unclear if this is done on a consistent basis The edema is persisting I actually saw last night-as well as today-possibly slightly increased today compared to last night although the edema is not dramatic.  She does complain of some pain in her hand and actually shoulder area-the edema appears mainly confined to the dorsal aspect of her hand her radial pulse is intact Cap refill intact she is able to make a fist  CBC Latest Ref Rng 02/21/2015 12/13/2014 12/06/2014  WBC 4.0 - 10.5 K/uL 3.9(L) 6.2 6.8  Hemoglobin 12.0 - 15.0 g/dL 10.5(L) 8.3(L) 7.9(L)  Hematocrit 36.0 - 46.0 % 33.3(L) 26.7(L) 25.1(L)  Platelets 150 - 400 K/uL 180 359 447(H)   Lab Results  Component Value Date   CREATININE 0.58 02/21/2015   CREATININE 0.63 11/29/2014   CREATININE 0.50 11/21/2014    [facility] social history reviewed per admission note on 11/24/2014.  [facility] she does receive Vicodin every 6 hours when necessary pain  Review of systems Respiratory not complaining of shortness of breath Cardiac no complaints of chest pain Musculoskeletal concerned about the swelling in her left hand complaints of some pain here at times.  Neurologic does not complain of dizziness headache or numbness of the hand.      Physical examination Respiratory clear entry  bilaterally Cardiac heart sounds are normal no murmurs no increase in jugular venous pressure. Left arm and hand. Peripheral pulses are palpable. She has no active synovitis could not really appreciate  acute tenderness palpation of the joints or fingers. There is some nonpitting swelling in the dorsal aspect of her left hand there is some mild generalized tenderness. She is able to make it a make a fist . I could not really see significant increased edema of the right arm compared to left.     Impression/plan #1 edema of the left hand. Since he edema is persisting and there is some tenderness will order an x-ray-also a venous Doppler to rule out any DVT although I suspect likelihood of this is quite low.  Elevation of the hand will have to be encouraged strongly.  Also will obtain a CBC with differential BMP sedimentation rate and rheumatoid factor for follow-up.  [id number]

## 2015-03-10 ENCOUNTER — Ambulatory Visit (HOSPITAL_COMMUNITY)
Admit: 2015-03-10 | Discharge: 2015-03-10 | Disposition: A | Discharge status: Home / Self Care | Payer: Medicare Other | Source: Ambulatory Visit | Attending: Internal Medicine | Admitting: Internal Medicine

## 2015-03-10 ENCOUNTER — Encounter (HOSPITAL_COMMUNITY)
Admission: AD | Admit: 2015-03-10 | Discharge: 2015-03-10 | Disposition: A | Discharge status: Home / Self Care | Payer: Medicare Other | Source: Skilled Nursing Facility | Attending: Internal Medicine | Admitting: Internal Medicine

## 2015-03-10 ENCOUNTER — Ambulatory Visit (HOSPITAL_COMMUNITY): Payer: Medicare Other

## 2015-03-10 DIAGNOSIS — R609 Edema, unspecified: Secondary | ICD-10-CM | POA: Insufficient documentation

## 2015-03-10 DIAGNOSIS — M79602 Pain in left arm: Secondary | ICD-10-CM | POA: Diagnosis not present

## 2015-03-10 DIAGNOSIS — I10 Essential (primary) hypertension: Secondary | ICD-10-CM | POA: Insufficient documentation

## 2015-03-10 LAB — BASIC METABOLIC PANEL
BUN: 21 mg/dL — AB (ref 6–20)
CO2: 33 mmol/L — ABNORMAL HIGH (ref 22–32)
Calcium: 8.4 mg/dL — ABNORMAL LOW (ref 8.9–10.3)
Chloride: 106 mmol/L (ref 101–111)
Creatinine, Ser: 0.73 mg/dL (ref 0.44–1.00)
GFR calc Af Amer: >60 mL/min (ref >60)
GLUCOSE: 87 mg/dL (ref 65–99)
Potassium: 4 mmol/L (ref 3.5–5.1)
SODIUM: 141 mmol/L (ref 135–145)

## 2015-03-10 LAB — CBC WITH DIFFERENTIAL/PLATELET
BASOS PCT: 1 % (ref 0–1)
Basophils Absolute: 0 10*3/uL (ref 0.0–0.1)
EOS PCT: 3 % (ref 0–5)
Eosinophils Absolute: 0.1 10*3/uL (ref 0.0–0.7)
HEMATOCRIT: 36.3 % (ref 36.0–46.0)
HEMOGLOBIN: 11.6 g/dL — AB (ref 12.0–15.0)
LYMPHS ABS: 2.1 10*3/uL (ref 0.7–4.0)
Lymphocytes Relative: 49 % — ABNORMAL HIGH (ref 12–46)
MCH: 27.7 pg (ref 26.0–34.0)
MCHC: 32 g/dL (ref 30.0–36.0)
MCV: 86.6 fL (ref 78.0–100.0)
Monocytes Absolute: 0.4 10*3/uL (ref 0.1–1.0)
Monocytes Relative: 9 % (ref 3–12)
Neutro Abs: 1.7 10*3/uL (ref 1.7–7.7)
Neutrophils Relative %: 38 % — ABNORMAL LOW (ref 43–77)
Platelets: 173 10*3/uL (ref 150–400)
RBC: 4.19 MIL/uL (ref 3.87–5.11)
RDW: 17.7 % — ABNORMAL HIGH (ref 11.5–15.5)
WBC: 4.4 10*3/uL (ref 4.0–10.5)

## 2015-03-10 LAB — SEDIMENTATION RATE: Sed Rate: 10 mm/hr (ref 0–22)

## 2015-03-11 LAB — RHEUMATOID FACTOR: Rhuematoid fact SerPl-aCnc: 22.8 IU/mL — ABNORMAL HIGH (ref 0.0–13.9)

## 2015-04-01 ENCOUNTER — Encounter: Payer: Self-pay | Admitting: Internal Medicine

## 2015-04-01 ENCOUNTER — Non-Acute Institutional Stay (SKILLED_NURSING_FACILITY): Payer: Medicare Other | Admitting: Internal Medicine

## 2015-04-01 DIAGNOSIS — I10 Essential (primary) hypertension: Secondary | ICD-10-CM | POA: Diagnosis not present

## 2015-04-01 DIAGNOSIS — S72141D Displaced intertrochanteric fracture of right femur, subsequent encounter for closed fracture with routine healing: Secondary | ICD-10-CM | POA: Diagnosis not present

## 2015-04-01 DIAGNOSIS — R569 Unspecified convulsions: Secondary | ICD-10-CM | POA: Diagnosis not present

## 2015-04-01 DIAGNOSIS — D509 Iron deficiency anemia, unspecified: Secondary | ICD-10-CM

## 2015-04-01 NOTE — Progress Notes (Signed)
Patient ID: Alejandra Cruz, female   DOB: [birth year], 81 y.o.   MRN: [id number]      FACILITY: [facility]     LEVEL OF CARE:   SNF  This is a routine visit   CHIEF COMPLAINT:  Medical management of chronic medical conditions including history of left hip fracture status post repair-hypertension-osteoporosis-seizure disorder-anemia-depression.    HISTORY OF PRESENT ILLNESS:   This is a patient who apparently lives in her own apartment.  She has stairs to go into the apartment.  She got up in the middle of the night, did not use her walker, lost her balance and fell.  She suffered a left hip fracture.  She underwent a left partial hip replacement by Dr. Aline Brochure on 11/18/2014.  She tolerated the procedure fairly well.  I do not see any particular postoperative issues listed.    Her hemoglobin was 12.3 on admission, 9.3 on discharge.  Chemistry panels were normal.  She is not a diabetic Updated lab work showed hemoglobin has dipped down to the 8-7 .9 range-she was started on iron and hemoglobin appears to be trending up most recently 11.6 on 03/10/2015  Her other medical issues appear to be quite stable she does have a history of hypertension is on diltiazem but this appears to be stable most recent blood pressure 131/69.  She continues on calcium and Fosamax for suspected osteoporosis with concern for any increased fracture risk.  She continues on Keppra for seizure disorder this has not really been an issue during her stay here  She is been seen most recently for some edema of her left arm-workup has been essentially negative x-ray was negative for any acute process venous Doppler was negative lab work appeared to be unremarkable area  This has improved somewhat but still has some mild edema of the left arm this may be dependent related.         PAST MEDICAL HISTORY/PROBLEM LIST:                                Hyperlipidemia.    Hypertension.    Seizure disorder.     Restless legs syndrome.    Gastroesophageal reflux disease.    History of Helicobacter pylori, treated in 2010.    History of an esophageal web, dilated in 2010.    History of diverticulosis.    Osteoporosis.      Left hip fracture, previously noted.     PAST SURGICAL HISTORY:                 Vesicovaginal fistula closure in 1966.    Colonoscopy in October 2010.    Endoscopy in 2010.    Abdominal hysterectomy.    ORIF in June 2013.     Left hip partial arthroplasty on 11/19/2014.      CURRENT MEDICATIONS:  Discharge medications include:         Aspirin 81 mg daily.     Tylenol 650 q.6 p.r.n.       Fosamax 70 weekly.       Diltiazem 120 q.d.    Prozac 20 q.d.       Keppra 500 b.i.d.           Remeron 7.5 q.d.         Pravachol 20 mg q.d.          Multivitamin q.d.      SOCIAL HISTORY:  HOUSING:  The patient she lives on her own.   She lives in some form of complex.  Apparently has 15 stairs to get in.   FUNCTIONAL STATUS: Probably has used a walker in the past     FAMILY HISTORY:                     MOTHER:  Cancer and diabetes in her mother.   Deceased.     FATHER:  Diabetes in her father.   Deceased.     SIBLINGS:  She has one brother who is alive with Parkinson's disease.     REVIEW OF SYSTEMS In general no complaints of fever or chills Skin-no complaints of rashes or itching  The surgical site  healed unremarkably            CHEST/RESPIRATORY:  No shortness of breath.   CARDIAC:  No chest pain.  No palpitations.  No syncope or presyncope.   GI: Has not reported any abdominal discomfort constipation issues nausea or vomiting    GU:  No dysuria.    MUSCULOSKELETAL: .--Does not complain of joint pain this evening   NEUROLOGICAL: --Does not complain of dizziness headache or syncopal-type episodes.    PHYSICAL EXAMINATION:   VITAL SIGNS:    Temperature 98.2 pulse 60 respirations 18 blood pressure 131/69 weight is 203.4 this  again about 7 pounds over the past 2 months I suspect this is appetite related     GENERAL APPEARANCE:   Pleasant elderly female in no distress --sitting comfortably in her wheelchair  Her skin is warm and dry    HEENT: She has prescription lenses visual acuity appears grossly intact    MOUTH/THROAT:   She is edentulous his membranes are moist oropharynx is clear.     CHEST/RESPIRATORY:  clear to auscultation.  There is no wheezing.  Work of breathing appears to be normal.   CARDIOVASCULAR:   CARDIAC:  Heart sounds are normal.  Benign-sounding 1/6 systolic murmur.  -regular rate and rhythm.--No significant lower extremity edema   GASTROINTESTINAL:    Abdomen is soft nontender with positive bowel sounds      EDEMA/VARICOSITIES:  No edema.          Musculoskeletal-is able to move all extremities 4 --I did not note any deformities strength appears to be intact-largely ambulates in a wheelchair she does use a walker at times   Neurologic is grossly intact her speech is clear-no lateralizing findings   .  Labs.  03/10/2015.  Sodium 141 potassium 4 BUN 21 creatinine 0.73.  CBC 4.4 hemoglobin 11.6 platelets 173.  ESR-10.  Rheumatoid factor XXII.8  12/13/2014.  WBC 6.2 hemoglobin 8.3 platelets 359.  12/06/2014.  WBC 6.8 hemoglobin 7.9 platelets 447.  11/29/2014.  Sodium 141 potassium 4 BUN 20 creatinine 0.63.  Albumin 2.2 otherwise liver function tests within normal limits  11/29/2014.  WBC 6.4 hemoglobin 8.0 platelets 349.  Sodium 141 potassium 4 BUN 20 creatinine 0.63.  Albumin 2.2 otherwise liver function tests within normal limits    Assessment plan.  #1-anemia-her hemoglobin has trended up and stabilized most recently 11.6 she is on iron    #2-history of left hip fracture with repair this appears to be stable she has seen orthopedics and is weightbearing as tolerated-  She is on Fosamax as well for suspected osteoporosis--.          Hyperlipidemia.  --Continues on a statin recent liver function tests were within normal limits--as per panel  May 2016 showed cholesterol 123 LDL 71 HDL 44 triglycerides of 38  .    Hypertension.  At this point appears stable she is on diltiazem. BMP has been unremarkable   I  History of depression apparently this has been significant the past but has not really been an issue here she is on Paxil appears to be doing quite well with this.    I note she also is on Remeron. As well--this is at daily at bedtime    History of seizure disorder there been none to my knowledge during her stay here she continues on Keppra.  Weight gain apparently she has gained about 18 pounds the past 2 months appears about 7 pounds the last several weeks-I suspect this is appetite related I do not see evidence of edema any shortness of breath clinically appears to be stable apparently  is eating quite well--and snacks often.        [id number]  .

## 2015-04-05 ENCOUNTER — Other Ambulatory Visit: Payer: Self-pay

## 2015-04-05 MED ORDER — HYDROCODONE-ACETAMINOPHEN 5-325 MG PO TABS: ORAL_TABLET | ORAL | Status: DC

## 2015-04-05 NOTE — Telephone Encounter (Signed)
RX faxed to [facility] @ [phone]. Phone number [phone]  

## 2015-04-08 ENCOUNTER — Non-Acute Institutional Stay (SKILLED_NURSING_FACILITY): Payer: Medicare Other | Admitting: Internal Medicine

## 2015-04-08 DIAGNOSIS — R001 Bradycardia, unspecified: Secondary | ICD-10-CM | POA: Diagnosis not present

## 2015-04-08 DIAGNOSIS — I10 Essential (primary) hypertension: Secondary | ICD-10-CM | POA: Diagnosis not present

## 2015-04-08 DIAGNOSIS — R229 Localized swelling, mass and lump, unspecified: Secondary | ICD-10-CM | POA: Diagnosis not present

## 2015-04-09 ENCOUNTER — Encounter (HOSPITAL_COMMUNITY)
Admission: RE | Admit: 2015-04-09 | Discharge: 2015-04-09 | Disposition: A | Discharge status: Home / Self Care | Payer: Medicare Other | Source: Skilled Nursing Facility | Attending: Internal Medicine | Admitting: Internal Medicine

## 2015-04-09 DIAGNOSIS — I10 Essential (primary) hypertension: Secondary | ICD-10-CM | POA: Diagnosis not present

## 2015-04-09 LAB — CBC WITH DIFFERENTIAL/PLATELET
Basophils Absolute: 0 10*3/uL (ref 0.0–0.1)
Basophils Relative: 1 % (ref 0–1)
Eosinophils Absolute: 0.1 10*3/uL (ref 0.0–0.7)
Eosinophils Relative: 4 % (ref 0–5)
HCT: 33.8 % — ABNORMAL LOW (ref 36.0–46.0)
Hemoglobin: 10.9 g/dL — ABNORMAL LOW (ref 12.0–15.0)
LYMPHS ABS: 2 10*3/uL (ref 0.7–4.0)
Lymphocytes Relative: 49 % — ABNORMAL HIGH (ref 12–46)
MCH: 28.6 pg (ref 26.0–34.0)
MCHC: 32.2 g/dL (ref 30.0–36.0)
MCV: 88.7 fL (ref 78.0–100.0)
Monocytes Absolute: 0.5 10*3/uL (ref 0.1–1.0)
Monocytes Relative: 11 % (ref 3–12)
NEUTROS PCT: 35 % — AB (ref 43–77)
Neutro Abs: 1.4 10*3/uL — ABNORMAL LOW (ref 1.7–7.7)
Platelets: 156 10*3/uL (ref 150–400)
RBC: 3.81 MIL/uL — ABNORMAL LOW (ref 3.87–5.11)
RDW: 16.9 % — ABNORMAL HIGH (ref 11.5–15.5)
WBC: 4 10*3/uL (ref 4.0–10.5)

## 2015-04-09 LAB — BASIC METABOLIC PANEL
ANION GAP: 6 (ref 5–15)
BUN: 19 mg/dL (ref 6–20)
CHLORIDE: 104 mmol/L (ref 101–111)
CO2: 33 mmol/L — ABNORMAL HIGH (ref 22–32)
CREATININE: 0.78 mg/dL (ref 0.44–1.00)
Calcium: 8.4 mg/dL — ABNORMAL LOW (ref 8.9–10.3)
GFR calc non Af Amer: >60 mL/min (ref >60)
Glucose, Bld: 82 mg/dL (ref 65–99)
POTASSIUM: 4 mmol/L (ref 3.5–5.1)
Sodium: 143 mmol/L (ref 135–145)

## 2015-04-10 ENCOUNTER — Encounter: Payer: Self-pay | Admitting: Internal Medicine

## 2015-04-10 DIAGNOSIS — R001 Bradycardia, unspecified: Secondary | ICD-10-CM | POA: Insufficient documentation

## 2015-04-10 NOTE — Progress Notes (Signed)
Patient ID: Alejandra Cruz, female   DOB: 1933-09-04, 81 y.o.   MRN: [id number]        date is 04/08/2015  FACILITY: [facility]     LEVEL OF CARE:   SNF  This is an acute visit   CHIEF COMPLAINT:    Acute visit follow-up  Lumps on leg ??-- bradycardia.    HISTORY OF PRESENT ILLNESS:     patient is a pleasant elderly resident who is here for rehabilitation after sustaining a left hip fracture that was surgically repaired.   nursing is left a note about some lumps lower legs -that appear to have resolved today.  She does not complain of any pain here  -there is been no recent trauma.    Her other medical issues appear to be quite stable she does have a history of hypertension is on diltiazem -- I just noted incidentally on exam this evening that her pulse was 50 -she is asymptomatic no complaints of dizziness weakness syncopal-type feelings chest pain or palpitations.            PAST MEDICAL HISTORY/PROBLEM LIST:                                Hyperlipidemia.    Hypertension.    Seizure disorder.    Restless legs syndrome.    Gastroesophageal reflux disease.    History of Helicobacter pylori, treated in 2010.    History of an esophageal web, dilated in 2010.    History of diverticulosis.    Osteoporosis.      Left hip fracture, previously noted.     PAST SURGICAL HISTORY:                 Vesicovaginal fistula closure in 1966.    Colonoscopy in October 2010.    Endoscopy in 2010.    Abdominal hysterectomy.    ORIF in June 2013.     Left hip partial arthroplasty on 11/19/2014.      CURRENT MEDICATIONS:  Discharge medications include:         Aspirin 81 mg daily.     Tylenol 650 q.6 p.r.n.       Fosamax 70 weekly.       Diltiazem 120 q.d.    Prozac 20 q.d.       Keppra 500 b.i.d.           Remeron 7.5 q.d.         Pravachol 20 mg q.d.          Multivitamin q.d.      SOCIAL HISTORY:                   HOUSING:  The patient  she lives on her own.   She lives in some form of complex.  Apparently has 15 stairs to get in.   FUNCTIONAL STATUS: Probably has used a walker in the past     FAMILY HISTORY:                     MOTHER:  Cancer and diabetes in her mother.   Deceased.     FATHER:  Diabetes in her father.   Deceased.     SIBLINGS:  She has one brother who is alive with Parkinson's disease.     REVIEW OF SYSTEMS In general no complaints of fever or chills Skin-no complaints of rashes  or itching does not appear to have significant lumps noted on her lower legs this evening             CHEST/RESPIRATORY:  No shortness of breath.   CARDIAC:  No chest pain.  No palpitations.  No syncope or presyncope.   GI: Has not reported any abdominal discomfort constipation issues nausea or vomiting    GU:  No dysuria.    MUSCULOSKELETAL: .--Does not complain of joint pain this evening   NEUROLOGICAL: --Does not complain of dizziness headache or syncopal-type episodes.    PHYSICAL EXAMINATION:   VITAL SIGNS:      she is afebrile pulse of 50 respirations of 16 blood pressure 137/75     GENERAL APPEARANCE:   Pleasant elderly female in no distress --sitting comfortably in her wheelchair  Her skin is warm and dry    HEENT: She has prescription lenses visual acuity appears grossly intact    MOUTH/THROAT:   She is edentulous his membranes are moist oropharynx is clear.     CHEST/RESPIRATORY:  clear to auscultation.  There is no wheezing.  Work of breathing appears to be normal.   CARDIOVASCULAR:   CARDIAC:  Heart sounds are normal.  Benign-sounding 1/6 systolic murmur.  -regular rate and rhythm.--minimal lower extremity edema   GASTROINTESTINAL:    Abdomen is soft nontender with positive bowel sounds              Musculoskeletal-is able to move all extremities 4 --I did not note any deformities strength appears to be intact-largely ambulates in a wheelchair she does use a walker at times  there was not really any  pain to palpation of her legs bilaterally I could not really appreciate any significant lumps of her lower legs bilaterally no erythema increased warmth or tenderness   Neurologic is grossly intact her speech is clear-no lateralizing findings   .  Labs.  03/10/2015.  Sodium 141 potassium 4 BUN 21 creatinine 0.73.  CBC 4.4 hemoglobin 11.6 platelets 173.  ESR-10.  Rheumatoid factor XXII.8  12/13/2014.  WBC 6.2 hemoglobin 8.3 platelets 359.  12/06/2014.  WBC 6.8 hemoglobin 7.9 platelets 447.  11/29/2014.  Sodium 141 potassium 4 BUN 20 creatinine 0.63.  Albumin 2.2 otherwise liver function tests within normal limits  11/29/2014.  WBC 6.4 hemoglobin 8.0 platelets 349.  Sodium 141 potassium 4 BUN 20 creatinine 0.63.  Albumin 2.2 otherwise liver function tests within normal limits    Assessment plan.   #1- questionable lumps on legs -dermatitis unspecified?-I do not really see that on exam today this will have to be monitored for any reoccurrence however physical exam appear to be benign.   #2-bradycardia-I suspect this is due to the Cardizem 120 mg extended release-she is on this for hypertension at this point blood pressure appears relatively controlled- it appears she is on the lowest dose of Cardizem-will write an order to hold this for now monitor vital signs every shift notified for Jeanne Ivan pulse less than 50 or greater than 132 or systolic blood pressure greater than 170.   Again this will have to be watched closely although clinically she appears to be doing well.  Also would like to update a metabolic panel and CBC tomorrow to make sure there are no electrolyte abnormalities contributing to  The bradycardia although I suspect this is more medication related  #3-anemia-her hemoglobin has trended up and stabilized most recently 11.6 she is on iron will update this    #4-history of left hip  fracture with repair this appears to be stable she has seen orthopedics and  is weightbearing as tolerated-  She is on Fosamax as well for suspected osteoporosis--.   [id number]           [id number]  .

## 2015-04-19 ENCOUNTER — Telehealth: Payer: Self-pay | Admitting: *Deleted

## 2015-04-19 NOTE — Telephone Encounter (Signed)
noted 

## 2015-04-19 NOTE — Telephone Encounter (Signed)
Pt called and canceled for appt for 05/04/15 because she is in a nursing home now and pt wanted to let Dr. Lodema HongSimpson know that [facility] and has been there since the end of February.

## 2015-04-28 ENCOUNTER — Encounter: Payer: Medicare Other | Admitting: Family Medicine

## 2015-05-02 ENCOUNTER — Ambulatory Visit (HOSPITAL_COMMUNITY): Payer: Medicare Other | Attending: Family Medicine

## 2015-05-02 DIAGNOSIS — Z1231 Encounter for screening mammogram for malignant neoplasm of breast: Secondary | ICD-10-CM | POA: Insufficient documentation

## 2015-05-11 ENCOUNTER — Non-Acute Institutional Stay (SKILLED_NURSING_FACILITY): Payer: Medicare Other | Admitting: Internal Medicine

## 2015-05-11 ENCOUNTER — Encounter: Payer: Self-pay | Admitting: Internal Medicine

## 2015-05-11 DIAGNOSIS — D509 Iron deficiency anemia, unspecified: Secondary | ICD-10-CM | POA: Diagnosis not present

## 2015-05-11 DIAGNOSIS — R059 Cough, unspecified: Secondary | ICD-10-CM

## 2015-05-11 DIAGNOSIS — I10 Essential (primary) hypertension: Secondary | ICD-10-CM

## 2015-05-11 DIAGNOSIS — S72002D Fracture of unspecified part of neck of left femur, subsequent encounter for closed fracture with routine healing: Secondary | ICD-10-CM

## 2015-05-11 DIAGNOSIS — R05 Cough: Secondary | ICD-10-CM

## 2015-05-12 ENCOUNTER — Encounter (HOSPITAL_COMMUNITY)
Admission: AD | Admit: 2015-05-12 | Discharge: 2015-05-12 | Disposition: A | Discharge status: Home / Self Care | Payer: Medicare Other | Source: Skilled Nursing Facility | Attending: Internal Medicine | Admitting: Internal Medicine

## 2015-05-12 ENCOUNTER — Ambulatory Visit (INDEPENDENT_AMBULATORY_CARE_PROVIDER_SITE_OTHER): Payer: Medicare Other | Admitting: Orthopedic Surgery

## 2015-05-12 VITALS — BP 125/67 | Ht 67.0 in | Wt 191.0 lb

## 2015-05-12 DIAGNOSIS — Z9660 Presence of unspecified orthopedic joint implant: Secondary | ICD-10-CM

## 2015-05-12 DIAGNOSIS — Z86718 Personal history of other venous thrombosis and embolism: Secondary | ICD-10-CM | POA: Insufficient documentation

## 2015-05-12 DIAGNOSIS — E785 Hyperlipidemia, unspecified: Secondary | ICD-10-CM | POA: Diagnosis not present

## 2015-05-12 DIAGNOSIS — S72002D Fracture of unspecified part of neck of left femur, subsequent encounter for closed fracture with routine healing: Secondary | ICD-10-CM | POA: Diagnosis not present

## 2015-05-12 DIAGNOSIS — Z96649 Presence of unspecified artificial hip joint: Secondary | ICD-10-CM

## 2015-05-12 LAB — CBC WITH DIFFERENTIAL/PLATELET
Basophils Absolute: 0 10*3/uL (ref 0.0–0.1)
Basophils Relative: 1 % (ref 0–1)
Eosinophils Absolute: 0.1 10*3/uL (ref 0.0–0.7)
Eosinophils Relative: 3 % (ref 0–5)
HCT: 35 % — ABNORMAL LOW (ref 36.0–46.0)
Hemoglobin: 11.1 g/dL — ABNORMAL LOW (ref 12.0–15.0)
LYMPHS PCT: 48 % — AB (ref 12–46)
Lymphs Abs: 2.1 10*3/uL (ref 0.7–4.0)
MCH: 29 pg (ref 26.0–34.0)
MCHC: 31.7 g/dL (ref 30.0–36.0)
MCV: 91.4 fL (ref 78.0–100.0)
MONO ABS: 1 10*3/uL (ref 0.1–1.0)
MONOS PCT: 22 % — AB (ref 3–12)
Neutro Abs: 1.1 10*3/uL — ABNORMAL LOW (ref 1.7–7.7)
Neutrophils Relative %: 26 % — ABNORMAL LOW (ref 43–77)
PLATELETS: 164 10*3/uL (ref 150–400)
RBC: 3.83 MIL/uL — AB (ref 3.87–5.11)
RDW: 16.1 % — AB (ref 11.5–15.5)
WBC: 4.3 10*3/uL (ref 4.0–10.5)

## 2015-05-12 LAB — COMPREHENSIVE METABOLIC PANEL
ALT: 18 U/L (ref 14–54)
AST: 23 U/L (ref 15–41)
Albumin: 3.2 g/dL — ABNORMAL LOW (ref 3.5–5.0)
Alkaline Phosphatase: 52 U/L (ref 38–126)
Anion gap: 8 (ref 5–15)
BILIRUBIN TOTAL: 0.5 mg/dL (ref 0.3–1.2)
BUN: 20 mg/dL (ref 6–20)
CO2: 33 mmol/L — AB (ref 22–32)
CREATININE: 0.72 mg/dL (ref 0.44–1.00)
Calcium: 8.3 mg/dL — ABNORMAL LOW (ref 8.9–10.3)
Chloride: 104 mmol/L (ref 101–111)
GLUCOSE: 80 mg/dL (ref 65–99)
Potassium: 4 mmol/L (ref 3.5–5.1)
Sodium: 145 mmol/L (ref 135–145)
Total Protein: 6.3 g/dL — ABNORMAL LOW (ref 6.5–8.1)

## 2015-05-12 NOTE — Patient Instructions (Signed)
Gait training  Walker weight bearing as tolerated  Discharge when appropriate  Follow up as needed

## 2015-05-12 NOTE — Progress Notes (Signed)
Patient ID: Alejandra Cruz, female   DOB: Mar 12, 1933, 81 y.o.   MRN: [id number]  Follow up visit  Chief Complaint  Patient presents with  . Follow-up    3 month follow up left bipolar hip, DOS 11/19/14    BP 125/67 mmHg  Ht  (1.702 m)  Wt 191 lb (86.637 kg)  BMI 29.91 kg/m2  Encounter Diagnoses  Name Primary?  . Fracture of femoral neck, left, closed, with routine healing, subsequent encounter Yes  . S/P hip replacement     6 months  Status post left bipolar hip doing well hip motion is excellent 125 of flexion with no pain patient is ambulatory with a walker and assistance  Patient is discharge of release from care can be discharged home when appropriate

## 2015-05-13 DIAGNOSIS — E785 Hyperlipidemia, unspecified: Secondary | ICD-10-CM | POA: Diagnosis not present

## 2015-05-13 LAB — CBC WITH DIFFERENTIAL/PLATELET
Basophils Absolute: 0 10*3/uL (ref 0.0–0.1)
Basophils Relative: 1 % (ref 0–1)
EOS PCT: 3 % (ref 0–5)
Eosinophils Absolute: 0.1 10*3/uL (ref 0.0–0.7)
HCT: 34.6 % — ABNORMAL LOW (ref 36.0–46.0)
Hemoglobin: 11.1 g/dL — ABNORMAL LOW (ref 12.0–15.0)
LYMPHS ABS: 1.9 10*3/uL (ref 0.7–4.0)
LYMPHS PCT: 44 % (ref 12–46)
MCH: 29.4 pg (ref 26.0–34.0)
MCHC: 32.1 g/dL (ref 30.0–36.0)
MCV: 91.5 fL (ref 78.0–100.0)
Monocytes Absolute: 0.7 10*3/uL (ref 0.1–1.0)
Monocytes Relative: 16 % — ABNORMAL HIGH (ref 3–12)
NEUTROS ABS: 1.5 10*3/uL — AB (ref 1.7–7.7)
Neutrophils Relative %: 36 % — ABNORMAL LOW (ref 43–77)
Platelets: 159 10*3/uL (ref 150–400)
RBC: 3.78 MIL/uL — AB (ref 3.87–5.11)
RDW: 16 % — ABNORMAL HIGH (ref 11.5–15.5)
WBC: 4.3 10*3/uL (ref 4.0–10.5)

## 2015-05-13 LAB — COMPREHENSIVE METABOLIC PANEL
ALK PHOS: 59 U/L (ref 38–126)
ALT: 16 U/L (ref 14–54)
AST: 23 U/L (ref 15–41)
Albumin: 3.2 g/dL — ABNORMAL LOW (ref 3.5–5.0)
Anion gap: 8 (ref 5–15)
BUN: 19 mg/dL (ref 6–20)
CO2: 31 mmol/L (ref 22–32)
Calcium: 7.9 mg/dL — ABNORMAL LOW (ref 8.9–10.3)
Chloride: 103 mmol/L (ref 101–111)
Creatinine, Ser: 0.66 mg/dL (ref 0.44–1.00)
GFR calc Af Amer: >60 mL/min (ref >60)
GFR calc non Af Amer: >60 mL/min (ref >60)
Glucose, Bld: 85 mg/dL (ref 65–99)
Potassium: 4 mmol/L (ref 3.5–5.1)
Sodium: 142 mmol/L (ref 135–145)
Total Bilirubin: 0.5 mg/dL (ref 0.3–1.2)
Total Protein: 6.1 g/dL — ABNORMAL LOW (ref 6.5–8.1)

## 2015-06-07 NOTE — Progress Notes (Signed)
Patient ID: Alejandra Cruz, female   DOB: [birth year], 81 y.o.   MRN: [id number]         date is 05/11/2015  FACILITY: [facility]     LEVEL OF CARE:   SNF  This is an acute visit   CHIEF COMPLAINT:    Medical management of chronic issues including history of left hip fracture-hypertension-osteoporosis-seizure disorder.    HISTORY OF PRESENT ILLNESS:     patient is a pleasant elderly resident who is here for rehabilitation after sustaining a left hip fracture that was surgically repaired.       Her other medical issues appear to be quite stable she does have a history of hypertension is on diltiazem -- It appears she has bradycardic pulses at times in the 50s she is on a low-dose of diltiazem-she is asymptomatic does not complain of dizziness headache or syncopal-type feelings or palpitations.  She also has a history seizure disorder there been none recently she is on Keppra.  For osteoporosis continues on Fosamax as well as calcium.  She also has a history of iron deficiency anemia she is on iron hemoglobin have been stable in the past we will need to recheck this.  Apparently earlier this week there are complaints of cough and congestion however she says this is improved today she does not complain of shortness of breath fever or chills            PAST MEDICAL HISTORY/PROBLEM LIST:                                Hyperlipidemia.    Hypertension.    Seizure disorder.    Restless legs syndrome.    Gastroesophageal reflux disease.    History of Helicobacter pylori, treated in 2010.    History of an esophageal web, dilated in 2010.    History of diverticulosis.    Osteoporosis.      Left hip fracture, previously noted.     PAST SURGICAL HISTORY:                 Vesicovaginal fistula closure in 1966.    Colonoscopy in October 2010.    Endoscopy in 2010.    Abdominal hysterectomy.    ORIF in June 2013.     Left hip partial arthroplasty  on 11/19/2014.      CURRENT MEDICATIONS:  Discharge medications include:         Aspirin 81 mg daily.     Tylenol 650 q.6 p.r.n.       Fosamax 70 weekly.       Diltiazem 120 q.d.    Prozac 20 q.d.       Keppra 500 b.i.d.           Remeron 7.5 q.d.         Pravachol 20 mg q.d.          Multivitamin q.d.  Iron 325 mg twice a day      SOCIAL HISTORY:                   HOUSING:  The patient she lives on her own.   She lives in some form of complex.  Apparently has 15 stairs to get in.   FUNCTIONAL STATUS: Probably has used a walker in the past     FAMILY HISTORY:  MOTHER:  Cancer and diabetes in her mother.   Deceased.     FATHER:  Diabetes in her father.   Deceased.     SIBLINGS:  She has one brother who is alive with Parkinson's disease.     REVIEW OF SYSTEMS In general no complaints of fever or chills Skin-no complaints of rashes or itching  has some fleshy lumps on her lower extremities that,come and go             CHEST/RESPIRATORY:  No shortness of breath. Has a cough but says is much better today  CARDIAC:  No chest pain.  No palpitations.  No syncope or presyncope.   GI: Has not reported any abdominal discomfort constipation issues nausea or vomiting    GU:  No dysuria.    MUSCULOSKELETAL: .--Does not complain of joint pain this evening   NEUROLOGICAL: --Does not complain of dizziness headache or syncopal-type episodes.    PHYSICAL EXAMINATION:   VITAL SIGNS:   She is afebrile pulse of 558respirations 20 blood pressure 100/60 as see variable blood pressures ranging 100/60 152/62 and142/69 128/67      GENERAL APPEARANCE:   Pleasant elderly female in no distress --sitting comfortably in her wheelchair  Her skin is warm and dry    HEENT: She has prescription lenses visual acuity appears grossly intact    MOUTH/THROAT:   She is edentulous his membranes are moist oropharynx is clear.     CHEST/RESPIRATORY:  clear to auscultation.  There is no  wheezing.  Work of breathing appears to be normal.   CARDIOVASCULAR:   CARDIAC:  Heart sounds are normal.  Benign-sounding 1/6 systolic murmur.  -regular rate and rhythm.--minimal lower extremity edema   GASTROINTESTINAL:    Abdomen is soft nontender with positive bowel sounds              Musculoskeletal-is able to move all extremities 4 --I did not note any deformities strength appears to be intact-largely ambulates in a wheelchair she does use a walker at times     Neurologic is grossly intact her speech is clear-no lateralizing findings   .  Labs.  04/09/2015.  Sodium 143 potassium 4 BUN 19 creatinine 0.78.  WBC 4.0 hemoglobin 10.9 platelets 156.    03/10/2015.  Sodium 141 potassium 4 BUN 21 creatinine 0.73.  CBC 4.4 hemoglobin 11.6 platelets 173.  ESR-10.  Rheumatoid factor XXII.8  12/13/2014.  WBC 6.2 hemoglobin 8.3 platelets 359.  12/06/2014.  WBC 6.8 hemoglobin 7.9 platelets 447.  11/29/2014.  Sodium 141 potassium 4 BUN 20 creatinine 0.63.  Albumin 2.2 otherwise liver function tests within normal limits  11/29/2014.  WBC 6.4 hemoglobin 8.0 platelets 349.  Sodium 141 potassium 4 BUN 20 creatinine 0.63.  Albumin 2.2 otherwise liver function tests within normal limits    Assessment plan.  #1 cough-this appears to be improved will order Robitussin every 6 hours when necessary for 5 days and monitor with vital signs pulse ox 48 hours long for review he keep an eye on her clinically as well as see what her blood pressures run   #2-bradycardia-I suspect this is due to the Cardizem 120 mg extended release-she is on this for hypertension at this point blood pressure appears relatively controlled- it appears she is on the lowest dose of Cardizem- She is asymptomatic at this point monitor.     #3-anemia-her hemoglobin has stabilized most recently100.9 she is on iron will update this    #4-history of left hip fracture with  repair this appears  to be stable she has seen orthopedics and is weightbearing as tolerated-she is on Fosamax as well as calcium supplementation.  #5 history seizure disorder there been no seizures for quite some time to my knowledge she is on Keppra.    #6 hyperlipidemia she is on a statin cluster panel in May was unremarkable with cholesterol 123 LDL 71 will update liver function tests  #7 history depression this is stable she is on Remeron   [id number]          .

## 2015-06-21 ENCOUNTER — Non-Acute Institutional Stay (SKILLED_NURSING_FACILITY): Payer: Medicare Other | Admitting: Internal Medicine

## 2015-06-21 ENCOUNTER — Encounter: Payer: Self-pay | Admitting: Internal Medicine

## 2015-06-21 DIAGNOSIS — D509 Iron deficiency anemia, unspecified: Secondary | ICD-10-CM

## 2015-06-21 DIAGNOSIS — M153 Secondary multiple arthritis: Secondary | ICD-10-CM | POA: Diagnosis not present

## 2015-06-21 DIAGNOSIS — E785 Hyperlipidemia, unspecified: Secondary | ICD-10-CM | POA: Diagnosis not present

## 2015-06-21 DIAGNOSIS — I10 Essential (primary) hypertension: Secondary | ICD-10-CM

## 2015-06-21 DIAGNOSIS — IMO0002 Reserved for concepts with insufficient information to code with codable children: Secondary | ICD-10-CM | POA: Insufficient documentation

## 2015-06-21 DIAGNOSIS — R001 Bradycardia, unspecified: Secondary | ICD-10-CM | POA: Diagnosis not present

## 2015-06-21 DIAGNOSIS — M1710 Unilateral primary osteoarthritis, unspecified knee: Secondary | ICD-10-CM | POA: Insufficient documentation

## 2015-06-21 NOTE — Progress Notes (Signed)
Patient ID: Alejandra Cruz, female   DOB: 11-Sep-1933, 81 y.o.   MRN: [id number]           FACILITY: [facility]     LEVEL OF CARE:   SNF  This is a routine visit   CHIEF COMPLAINT:    Medical management of chronic issues including history of left hip fracture-hypertension-osteoporosis-seizure disorder.    HISTORY OF PRESENT ILLNESS:     patient is a pleasant elderly resident who is here for rehabilitation after sustaining a left hip fracture that was surgically repaired.       Her other medical issues appear to be quite stable she does have a history of hypertension is on diltiazem -- It appears she has bradycardic pulses at times in the 50s she iwas on a low-dose of diltiazem-she is asymptomatic does not complain of dizziness headache or syncopal-type feelings or palpitations--her diltiazem essentially as been discontinued nonetheless pulses appear to be stable largely in the 70s blood pressures also appear to be stable recently 135/77-108/59-133/73.  She also has a history seizure disorder there been none recently she is on Keppra.  For osteoporosis continues on Fosamax as well as calcium.  She also has a history of iron deficiency anemia she is on iron hemoglobin have been stable in the past we will need to recheck this.   She is complaining that she feels her left hand when she flexes her fingers feels somewhat more stiff-she feels this may be gradual arthritis which I would tend to agree with-feel she would benefit from a physical therapy evaluation she did not complain of numbness or tingling She says this actually has been chronic since she's arrived in the facility Recent x-ray showed basically mild osteoarthritic changes            PAST MEDICAL HISTORY/PROBLEM LIST:                                Hyperlipidemia.    Hypertension.    Seizure disorder.    Restless legs syndrome.    Gastroesophageal reflux disease.    History of Helicobacter  pylori, treated in 2010.    History of an esophageal web, dilated in 2010.    History of diverticulosis.    Osteoporosis.      Left hip fracture, previously noted.     PAST SURGICAL HISTORY:                 Vesicovaginal fistula closure in 1966.    Colonoscopy in October 2010.    Endoscopy in 2010.    Abdominal hysterectomy.    ORIF in June 2013.     Left hip partial arthroplasty on 11/19/2014.      CURRENT MEDICATIONS:  Discharge medications include:         Aspirin 81 mg daily.     Tylenol 650 q.6 p.r.n.       Fosamax 70 weekly.        Prozac 20 q.d.       Keppra 500 b.i.d.           Remeron 7.5 q.d.         Pravachol 20 mg q.d.          Multivitamin q.d.  Iron 325 mg twice a day   Calcium with vitamin D 1250 mg-200 units 3 times a day     SOCIAL HISTORY:  HOUSING:  The patient she lives on her own.   She lives in some form of complex.  Apparently has 15 stairs to get in.   FUNCTIONAL STATUS: Probably has used a walker in the past     FAMILY HISTORY:                     MOTHER:  Cancer and diabetes in her mother.   Deceased.     FATHER:  Diabetes in her father.   Deceased.     SIBLINGS:  She has one brother who is alive with Parkinson's disease.     REVIEW OF SYSTEMS In general no complaints of fever or chills Skin-no complaints of rashes or itching  has some fleshy lumps on her lower extremities that,come and go             CHEST/RESPIRATORY:  No shortness of breath. Or cough CARDIAC:  No chest pain.  No palpitations.  No syncope or presyncope.   GI: Has not reported any abdominal discomfort constipation issues nausea or vomiting    GU:  No dysuria.    MUSCULOSKELETAL: .-other than feeling that her left fingers feel increasingly stiff when she grips does not have any complaints NEUROLOGICAL: --Does not complain of dizziness headache or syncopal-type episodes.    PHYSICAL EXAMINATION:   VITAL SIGNS:   She is afebrile pulse 72  respirations 20 blood pressure 135/77 weight is 217.2      GENERAL APPEARANCE:   Pleasant elderly female in no distress --sitting comfortably in her wheelchair  Her skin is warm and dry    HEENT: She has prescription lenses visual acuity appears grossly intact    MOUTH/THROAT:   She is edentulous his membranes are moist oropharynx is clear.     CHEST/RESPIRATORY:  clear to auscultation.  There is no wheezing.  Work of breathing appears to be normal.   CARDIOVASCULAR:   CARDIAC:  Heart sounds are normal.  Benign-sounding 1/6 systolic murmur.  -regular rate and rhythm.--minimal lower extremity edema   GASTROINTESTINAL:    Abdomen is soft nontender with positive bowel sounds              Musculoskeletal-is able to move all extremities 4 --I did not note any deformities strength appears to be intact-largely ambulates in a wheelchair she does use a walker at times  her  grip strength on left versus the right appears relatively equal she says she just feels when she grips on the left that it feels more stiff I did not note any deformities or tenderness to her fingers radial pulses intact there is no edema  or erythema     Neurologic is grossly intact her speech is clear-no lateralizing findings   .  Labs.  05/13/2015.  WBC 4.3 hemoglobin 11.1 platelets 159.  Sodium 142 potassium 4 BUN 19 creatinine 0.66-of note calcium 7.9.  Albumin 3.2 otherwise liver function tests within normal limits    04/09/2015.  Sodium 143 potassium 4 BUN 19 creatinine 0.78.  WBC 4.0 hemoglobin 10.9 platelets 156.    03/10/2015.  Sodium 141 potassium 4 BUN 21 creatinine 0.73.  CBC 4.4 hemoglobin 11.6 platelets 173.  ESR-10.  Rheumatoid factor XXII.8  12/13/2014.  WBC 6.2 hemoglobin 8.3 platelets 359.  12/06/2014.  WBC 6.8 hemoglobin 7.9 platelets 447.  11/29/2014.  Sodium 141 potassium 4 BUN 20 creatinine 0.63.  Albumin 2.2 otherwise liver function tests within normal  limits  11/29/2014.  WBC 6.4 hemoglobin 8.0 platelets  349.  Sodium 141 potassium 4 BUN 20 creatinine 0.63.  Albumin 2.2 otherwise liver function tests within normal limits    Assessment plan.  #1 left hand fingers osteo arthritis-she does not appear to complain of acute pain with this-however she would like to feel bit stronger-will order physical therapy to evaluation to see if this could be of some benefit    #2-bradycardia-I suspect this was due to the Cardizem 120 mg extended release-she was on this for hypertension at this point blood pressure appears relatively controlled- pulses appear to be stable at this point monitor She s no longer receiving Cardizem     #3-anemia-her hemoglobin has stabilized most recently11.1 last month she is on iron will update this    #4-history of left hip fracture with repair this appears to be stable she has seen orthopedics and is weightbearing as tolerated-she is on Fosamax as well as calcium supplementation.  #5 history seizure disorder there been no seizures for quite some time to my knowledge she is on Keppra.    #6 hyperlipidemia she is on a statin clipid panel in May was unremarkable with cholesterol 123 LDL 71 her function tests have been within normal limits  #7 history depression this is stable she is on Remeron  #8-hypertension-as noted above this appears to be stable she is now off Cardizem nonetheless pulses blood pressures appear to be well controlled--we'll update a BMP  #9-appears she has somewhat low calcium she is now on calcium supplementation with vitamin D again will update a BMP for updated values   [id number]          .

## 2015-06-22 ENCOUNTER — Encounter (HOSPITAL_COMMUNITY)
Admission: AD | Admit: 2015-06-22 | Discharge: 2015-06-22 | Disposition: A | Discharge status: Home / Self Care | Payer: Medicare Other | Source: Skilled Nursing Facility | Attending: Internal Medicine | Admitting: Internal Medicine

## 2015-06-22 DIAGNOSIS — I10 Essential (primary) hypertension: Secondary | ICD-10-CM | POA: Diagnosis not present

## 2015-06-22 LAB — BASIC METABOLIC PANEL
ANION GAP: 5 (ref 5–15)
BUN: 20 mg/dL (ref 6–20)
CO2: 33 mmol/L — ABNORMAL HIGH (ref 22–32)
Calcium: 8.3 mg/dL — ABNORMAL LOW (ref 8.9–10.3)
Chloride: 105 mmol/L (ref 101–111)
Creatinine, Ser: 0.72 mg/dL (ref 0.44–1.00)
GFR calc Af Amer: >60 mL/min (ref >60)
Glucose, Bld: 86 mg/dL (ref 65–99)
POTASSIUM: 4.3 mmol/L (ref 3.5–5.1)
SODIUM: 143 mmol/L (ref 135–145)

## 2015-08-08 ENCOUNTER — Other Ambulatory Visit: Payer: Self-pay | Admitting: *Deleted

## 2015-08-08 MED ORDER — HYDROCODONE-ACETAMINOPHEN 5-325 MG PO TABS: ORAL_TABLET | ORAL | Status: DC

## 2015-08-08 NOTE — Telephone Encounter (Signed)
[facility] 

## 2015-08-31 ENCOUNTER — Encounter: Payer: Self-pay | Admitting: Internal Medicine

## 2015-08-31 ENCOUNTER — Non-Acute Institutional Stay (SKILLED_NURSING_FACILITY): Payer: Medicare Other | Admitting: Internal Medicine

## 2015-08-31 DIAGNOSIS — R001 Bradycardia, unspecified: Secondary | ICD-10-CM

## 2015-08-31 DIAGNOSIS — R569 Unspecified convulsions: Secondary | ICD-10-CM | POA: Diagnosis not present

## 2015-08-31 DIAGNOSIS — F418 Other specified anxiety disorders: Secondary | ICD-10-CM

## 2015-08-31 DIAGNOSIS — R600 Localized edema: Secondary | ICD-10-CM

## 2015-08-31 DIAGNOSIS — I10 Essential (primary) hypertension: Secondary | ICD-10-CM | POA: Diagnosis not present

## 2015-08-31 DIAGNOSIS — M1710 Unilateral primary osteoarthritis, unspecified knee: Secondary | ICD-10-CM

## 2015-08-31 DIAGNOSIS — M179 Osteoarthritis of knee, unspecified: Secondary | ICD-10-CM | POA: Diagnosis not present

## 2015-08-31 DIAGNOSIS — IMO0002 Reserved for concepts with insufficient information to code with codable children: Secondary | ICD-10-CM

## 2015-08-31 NOTE — Progress Notes (Signed)
Patient ID: Alejandra Cruz, female   DOB: 1933/03/23, 81 y.o.   MRN: [id number]            FACILITY: [facility]     LEVEL OF CARE:   SNF  This is a routine visit   CHIEF COMPLAINT:    Medical management of chronic issues including history of left hip fracture-hypertension-osteoporosis-seizure disorder.    HISTORY OF PRESENT ILLNESS:     patient is a pleasant elderly resident who is here for rehabilitation after sustaining a left hip fracture that was surgically repaired.       Her other medical issues appear to be quite stable she does have a history of hypertension is on diltiazem -- It appears she has bradycardic pulses at times in the 50s she iwas on a low-dose of diltiazem-she is asymptomatic does not complain of dizziness headache or syncopal-type feelings or palpitations--her  Diltiazem has been discontinued appears blood pressures are stable most recently 126/61-occasionally she still has pulses in the 50s but she is asymptomatic  She also has a history seizure disorder there been none recently she is on Keppra.  For osteoporosis continues on Fosamax as well as calcium.--She is complaining of some  She  lower leg pain in her right lower extremity pain-she does not complain of any trauma several--previously she did complain of this as well x-rays were negative as well as a venous Doppler  also has a history of iron deficiency anemia she is on iron hemoglobin have been stable in the past we will need to recheck this--most recent hemoglobin 11.1.               PAST MEDICAL HISTORY/PROBLEM LIST:                                Hyperlipidemia.    Hypertension.    Seizure disorder.    Restless legs syndrome.    Gastroesophageal reflux disease.    History of Helicobacter pylori, treated in 2010.    History of an esophageal web, dilated in 2010.    History of diverticulosis.    Osteoporosis.      Left hip fracture, previously noted.      PAST SURGICAL HISTORY:                 Vesicovaginal fistula closure in 1966.    Colonoscopy in October 2010.    Endoscopy in 2010.    Abdominal hysterectomy.    ORIF in June 2013.     Left hip partial arthroplasty on 11/19/2014.      CURRENT MEDICATIONS:  Discharge medications include:         Aspirin 81 mg daily.     Tylenol 650 q.6 p.r.n.       Fosamax 70 weekly.        Prozac 20 q.d.       Keppra 500 b.i.d.           Remeron 7.5 q.d.         Pravachol 20 mg q.d.          Multivitamin q.d.  Iron 325 mg twice a day   Calcium with vitamin D 1250 mg-200 units 3 times a day     SOCIAL HISTORY:                   HOUSING:  The patient she lives on her own.   She  lives in some form of complex.  Apparently has 15 stairs to get in.   FUNCTIONAL STATUS: Probably has used a walker in the past     FAMILY HISTORY:                     MOTHER:  Cancer and diabetes in her mother.   Deceased.     FATHER:  Diabetes in her father.   Deceased.     SIBLINGS:  She has one brother who is alive with Parkinson's disease.     REVIEW OF SYSTEMS In general no complaints of fever or chills Skin-no complaints of rashes or itching  has some fleshy lumps on her lower extremities that,come and go             CHEST/RESPIRATORY:  No shortness of breath. Or cough CARDIAC:  No chest pain.  No palpitations.  No syncope or presyncope.   GI: Has not reported any abdominal discomfort constipation issues nausea or vomiting    GU:  No dysuria.    MUSCULOSKELETAL:  Complain somewhat of right lower leg pain otherwise no complaints  NEUROLOGICAL: --Does not complain of dizziness headache or syncopal-type episodes.    PHYSICAL EXAMINATION:   VITAL SIGNS:   Temperature 97.3 pulse 60 respirations 18 blood pressure 126/61 weight 231.8 this appears again of about 10 pounds over several months-there may be some scale variation here I suspect as well as patient has a good appetite      GENERAL  APPEARANCE:   Pleasant elderly female in no distress --sitting comfortably in her wheelchair  Her skin is warm and dry    HEENT: She has prescription lenses visual acuity appears grossly intact    MOUTH/THROAT:   She is edentulous his membranes are moist oropharynx is clear.     CHEST/RESPIRATORY:  clear to auscultation.  There is no wheezing.  Work of breathing appears to be normal.   CARDIOVASCULAR:   CARDIAC:  Heart sounds are normal.  Benign-sounding 1/6 systolic murmur.  -regular rate and rhythm.--minimal lower extremity edema   GASTROINTESTINAL:    Abdomen is soft nontender with positive bowel sounds              Musculoskeletal-is able to move all extremities 4 --I did not note any deformities strength appears to be intact-largely ambulates in a wheelchair she does use a walker at times  There is very minimal tenderness to her right lower leg I  do not note any deformities there is some slight edema pedal pulse is slightly reduced      Neurologic is grossly intact her speech is clear-no lateralizing findings   .  Labs  06/22/2015.  Sodium 143 potassium 4.3 BUN 20 creatinine 0.7 to.  05/13/2015.  WBC 4.3 hemoglobin 11.1 platelets 159.  Sodium 142 potassium 4 BUN 19 creatinine 0.66-of note calcium 7.9.  Albumin 3.2 otherwise liver function tests within normal limits    04/09/2015.  Sodium 143 potassium 4 BUN 19 creatinine 0.78.  WBC 4.0 hemoglobin 10.9 platelets 156.    03/10/2015.  Sodium 141 potassium 4 BUN 21 creatinine 0.73.  CBC 4.4 hemoglobin 11.6 platelets 173.  ESR-10.  Rheumatoid factor XXII.8  12/13/2014.  WBC 6.2 hemoglobin 8.3 platelets 359.  12/06/2014.  WBC 6.8 hemoglobin 7.9 platelets 447.  11/29/2014.  Sodium 141 potassium 4 BUN 20 creatinine 0.63.  Albumin 2.2 otherwise liver function tests within normal limits  11/29/2014.  WBC 6.4 hemoglobin 8.0 platelets 349.  Sodium 141  potassium 4 BUN 20 creatinine  0.63.  Albumin 2.2 otherwise liver function tests within normal limits    Assessment plan.  #1  Right lower leg and foot discomfort-will update an x-ray as well as venous Doppler-this appears to be stable and somewhat chronic she does have Vicodin as needed for pain      #2-bradycardia-I suspect this was due to the Cardizem 120 mg extended release-she was on this for hypertension at this point blood pressure appears relatively controlled- pulses appear to be stable at this point monitor She s no longer receiving Cardizem     #3-anemia-her hemoglobin has stabilized most recently11.1 she is on iron will update this    #4-history of left hip fracture with repair this appears to be stable she has seen orthopedics and is weightbearing as tolerated-she is on Fosamax as well as calcium supplementation.  #5 history seizure disorder there been no seizures for quite some time to my knowledge she is on Keppra.    #6 hyperlipidemia she is on a statin clipid panel in May was unremarkable with cholesterol 123 LDL 71 her function tests have been within normal limits--will update liver function tests as well as lipid panel   #7 history depression this is stable she is on Remeron  #8-hypertension-as noted above this appears to be stable she is now off Cardizem  #9-appears she has somewhat low calcium she is now on calcium supplementation with vitamin D calcium 8.3 on September 14 this appears to be going up will update this    [id number]          .

## 2015-09-01 DIAGNOSIS — R600 Localized edema: Secondary | ICD-10-CM | POA: Insufficient documentation

## 2015-09-02 ENCOUNTER — Encounter (HOSPITAL_COMMUNITY)
Admission: AD | Admit: 2015-09-02 | Discharge: 2015-09-02 | Disposition: A | Discharge status: Home / Self Care | Payer: Medicare Other | Source: Skilled Nursing Facility | Attending: Internal Medicine | Admitting: Internal Medicine

## 2015-09-02 ENCOUNTER — Ambulatory Visit (HOSPITAL_COMMUNITY): Payer: Medicare Other | Attending: Internal Medicine

## 2015-09-02 DIAGNOSIS — M79671 Pain in right foot: Secondary | ICD-10-CM | POA: Insufficient documentation

## 2015-09-02 DIAGNOSIS — M8580 Other specified disorders of bone density and structure, unspecified site: Secondary | ICD-10-CM | POA: Diagnosis not present

## 2015-09-02 DIAGNOSIS — E785 Hyperlipidemia, unspecified: Secondary | ICD-10-CM | POA: Insufficient documentation

## 2015-09-02 DIAGNOSIS — M7989 Other specified soft tissue disorders: Secondary | ICD-10-CM | POA: Diagnosis not present

## 2015-09-02 DIAGNOSIS — I10 Essential (primary) hypertension: Secondary | ICD-10-CM | POA: Insufficient documentation

## 2015-09-02 DIAGNOSIS — M7731 Calcaneal spur, right foot: Secondary | ICD-10-CM | POA: Insufficient documentation

## 2015-09-02 DIAGNOSIS — M79604 Pain in right leg: Secondary | ICD-10-CM | POA: Insufficient documentation

## 2015-09-02 LAB — COMPREHENSIVE METABOLIC PANEL
ALBUMIN: 3.3 g/dL — AB (ref 3.5–5.0)
ALK PHOS: 80 U/L (ref 38–126)
ALT: 19 U/L (ref 14–54)
AST: 25 U/L (ref 15–41)
Anion gap: 6 (ref 5–15)
BILIRUBIN TOTAL: 0.5 mg/dL (ref 0.3–1.2)
BUN: 21 mg/dL — AB (ref 6–20)
CO2: 29 mmol/L (ref 22–32)
Calcium: 8.2 mg/dL — ABNORMAL LOW (ref 8.9–10.3)
Chloride: 109 mmol/L (ref 101–111)
Creatinine, Ser: 0.7 mg/dL (ref 0.44–1.00)
GFR calc Af Amer: >60 mL/min (ref >60)
GFR calc non Af Amer: >60 mL/min (ref >60)
GLUCOSE: 92 mg/dL (ref 65–99)
POTASSIUM: 4.2 mmol/L (ref 3.5–5.1)
Sodium: 144 mmol/L (ref 135–145)
TOTAL PROTEIN: 6.5 g/dL (ref 6.5–8.1)

## 2015-09-02 LAB — LIPID PANEL
Cholesterol: 112 mg/dL (ref 0–200)
HDL: 40 mg/dL — ABNORMAL LOW (ref >40)
LDL Cholesterol: 63 mg/dL (ref 0–99)
Total CHOL/HDL Ratio: 2.8 RATIO
Triglycerides: 44 mg/dL (ref <150)
VLDL: 9 mg/dL (ref 0–40)

## 2015-09-02 LAB — CBC WITH DIFFERENTIAL/PLATELET
BASOS ABS: 0 10*3/uL (ref 0.0–0.1)
Basophils Relative: 1 %
EOS PCT: 3 %
Eosinophils Absolute: 0.1 10*3/uL (ref 0.0–0.7)
HCT: 36.3 % (ref 36.0–46.0)
Hemoglobin: 11.9 g/dL — ABNORMAL LOW (ref 12.0–15.0)
LYMPHS ABS: 2.2 10*3/uL (ref 0.7–4.0)
LYMPHS PCT: 48 %
MCH: 29.8 pg (ref 26.0–34.0)
MCHC: 32.8 g/dL (ref 30.0–36.0)
MCV: 91 fL (ref 78.0–100.0)
MONO ABS: 0.5 10*3/uL (ref 0.1–1.0)
Monocytes Relative: 12 %
Neutro Abs: 1.7 10*3/uL (ref 1.7–7.7)
Neutrophils Relative %: 36 %
PLATELETS: 172 10*3/uL (ref 150–400)
RBC: 3.99 MIL/uL (ref 3.87–5.11)
RDW: 14.6 % (ref 11.5–15.5)
WBC: 4.6 10*3/uL (ref 4.0–10.5)

## 2015-09-02 LAB — SEDIMENTATION RATE: SED RATE: 32 mm/h — AB (ref 0–22)

## 2015-09-03 LAB — RHEUMATOID FACTOR: Rhuematoid fact SerPl-aCnc: 20.7 IU/mL — ABNORMAL HIGH (ref 0.0–13.9)

## 2015-09-07 ENCOUNTER — Ambulatory Visit (HOSPITAL_COMMUNITY)
Admit: 2015-09-07 | Discharge: 2015-09-07 | Disposition: A | Discharge status: Home / Self Care | Payer: Medicare Other | Source: Skilled Nursing Facility | Attending: Internal Medicine | Admitting: Internal Medicine

## 2015-09-07 ENCOUNTER — Ambulatory Visit (HOSPITAL_COMMUNITY): Payer: Medicare Other

## 2015-09-07 DIAGNOSIS — R600 Localized edema: Secondary | ICD-10-CM | POA: Diagnosis not present

## 2015-09-07 DIAGNOSIS — M79661 Pain in right lower leg: Secondary | ICD-10-CM | POA: Diagnosis not present

## 2015-09-21 ENCOUNTER — Encounter (HOSPITAL_COMMUNITY)
Admission: AD | Admit: 2015-09-21 | Discharge: 2015-09-21 | Disposition: A | Discharge status: Home / Self Care | Payer: Medicare Other | Source: Skilled Nursing Facility | Attending: Internal Medicine | Admitting: Internal Medicine

## 2015-09-21 DIAGNOSIS — I110 Hypertensive heart disease with heart failure: Secondary | ICD-10-CM | POA: Diagnosis not present

## 2015-09-21 DIAGNOSIS — E785 Hyperlipidemia, unspecified: Secondary | ICD-10-CM | POA: Diagnosis not present

## 2015-09-21 DIAGNOSIS — K219 Gastro-esophageal reflux disease without esophagitis: Secondary | ICD-10-CM | POA: Insufficient documentation

## 2015-09-21 DIAGNOSIS — N390 Urinary tract infection, site not specified: Secondary | ICD-10-CM | POA: Diagnosis not present

## 2015-09-21 DIAGNOSIS — I10 Essential (primary) hypertension: Secondary | ICD-10-CM | POA: Diagnosis present

## 2015-09-21 LAB — CBC
HEMATOCRIT: 36.5 % (ref 36.0–46.0)
Hemoglobin: 11.6 g/dL — ABNORMAL LOW (ref 12.0–15.0)
MCH: 29.1 pg (ref 26.0–34.0)
MCHC: 31.8 g/dL (ref 30.0–36.0)
MCV: 91.7 fL (ref 78.0–100.0)
PLATELETS: 142 10*3/uL — AB (ref 150–400)
RBC: 3.98 MIL/uL (ref 3.87–5.11)
RDW: 15.2 % (ref 11.5–15.5)
WBC: 6.1 10*3/uL (ref 4.0–10.5)

## 2015-10-05 ENCOUNTER — Ambulatory Visit (HOSPITAL_COMMUNITY)
Admission: RE | Admit: 2015-10-05 | Discharge: 2015-10-05 | Disposition: A | Discharge status: Home / Self Care | Payer: Medicare Other | Source: Ambulatory Visit | Attending: Internal Medicine | Admitting: Internal Medicine

## 2015-10-05 ENCOUNTER — Encounter (HOSPITAL_COMMUNITY)
Admission: AD | Admit: 2015-10-05 | Discharge: 2015-10-05 | Disposition: A | Discharge status: Home / Self Care | Payer: Medicare Other | Source: Skilled Nursing Facility | Attending: Internal Medicine | Admitting: Internal Medicine

## 2015-10-05 ENCOUNTER — Non-Acute Institutional Stay (SKILLED_NURSING_FACILITY): Payer: Medicare Other | Admitting: Internal Medicine

## 2015-10-05 DIAGNOSIS — R1084 Generalized abdominal pain: Secondary | ICD-10-CM | POA: Insufficient documentation

## 2015-10-05 DIAGNOSIS — D72829 Elevated white blood cell count, unspecified: Secondary | ICD-10-CM | POA: Diagnosis not present

## 2015-10-05 DIAGNOSIS — E785 Hyperlipidemia, unspecified: Secondary | ICD-10-CM | POA: Diagnosis not present

## 2015-10-05 LAB — URINALYSIS, ROUTINE W REFLEX MICROSCOPIC
GLUCOSE, UA: 100 mg/dL — AB
HGB URINE DIPSTICK: NEGATIVE
Leukocytes, UA: NEGATIVE
Nitrite: POSITIVE — AB
PROTEIN: 100 mg/dL — AB
Specific Gravity, Urine: >1.03 — ABNORMAL HIGH (ref 1.005–1.030)
pH: 5 (ref 5.0–8.0)

## 2015-10-05 LAB — URINE MICROSCOPIC-ADD ON
BACTERIA UA: NONE SEEN
RBC / HPF: NONE SEEN RBC/hpf (ref 0–5)
SQUAMOUS EPITHELIAL / LPF: NONE SEEN
WBC, UA: NONE SEEN WBC/hpf (ref 0–5)

## 2015-10-05 LAB — CBC
HCT: 31.6 % — ABNORMAL LOW (ref 36.0–46.0)
Hemoglobin: 10.5 g/dL — ABNORMAL LOW (ref 12.0–15.0)
MCH: 30.2 pg (ref 26.0–34.0)
MCHC: 33.2 g/dL (ref 30.0–36.0)
MCV: 90.8 fL (ref 78.0–100.0)
PLATELETS: 212 10*3/uL (ref 150–400)
RBC: 3.48 MIL/uL — ABNORMAL LOW (ref 3.87–5.11)
RDW: 15.5 % (ref 11.5–15.5)
WBC: 13.9 10*3/uL — ABNORMAL HIGH (ref 4.0–10.5)

## 2015-10-06 ENCOUNTER — Encounter (HOSPITAL_COMMUNITY)
Admission: AD | Admit: 2015-10-06 | Discharge: 2015-10-06 | Disposition: A | Discharge status: Home / Self Care | Payer: Medicare Other | Source: Skilled Nursing Facility | Attending: Internal Medicine | Admitting: Internal Medicine

## 2015-10-06 DIAGNOSIS — E785 Hyperlipidemia, unspecified: Secondary | ICD-10-CM | POA: Diagnosis not present

## 2015-10-06 LAB — COMPREHENSIVE METABOLIC PANEL
ALT: 43 U/L (ref 14–54)
ANION GAP: 5 (ref 5–15)
AST: 21 U/L (ref 15–41)
Albumin: 2.6 g/dL — ABNORMAL LOW (ref 3.5–5.0)
Alkaline Phosphatase: 104 U/L (ref 38–126)
BILIRUBIN TOTAL: 1.1 mg/dL (ref 0.3–1.2)
BUN: 20 mg/dL (ref 6–20)
CO2: 32 mmol/L (ref 22–32)
Calcium: 7.6 mg/dL — ABNORMAL LOW (ref 8.9–10.3)
Chloride: 104 mmol/L (ref 101–111)
Creatinine, Ser: 0.64 mg/dL (ref 0.44–1.00)
Glucose, Bld: 99 mg/dL (ref 65–99)
POTASSIUM: 3.3 mmol/L — AB (ref 3.5–5.1)
Sodium: 141 mmol/L (ref 135–145)
TOTAL PROTEIN: 6.3 g/dL — AB (ref 6.5–8.1)

## 2015-10-06 LAB — CBC WITH DIFFERENTIAL/PLATELET
BASOS ABS: 0 10*3/uL (ref 0.0–0.1)
Basophils Relative: 0 %
EOS PCT: 1 %
Eosinophils Absolute: 0.1 10*3/uL (ref 0.0–0.7)
HCT: 29.4 % — ABNORMAL LOW (ref 36.0–46.0)
Hemoglobin: 10 g/dL — ABNORMAL LOW (ref 12.0–15.0)
LYMPHS PCT: 16 %
Lymphs Abs: 1.9 10*3/uL (ref 0.7–4.0)
MCH: 31 pg (ref 26.0–34.0)
MCHC: 34 g/dL (ref 30.0–36.0)
MCV: 91 fL (ref 78.0–100.0)
Monocytes Absolute: 2.1 10*3/uL — ABNORMAL HIGH (ref 0.1–1.0)
Monocytes Relative: 18 %
NEUTROS ABS: 7.7 10*3/uL (ref 1.7–7.7)
NEUTROS PCT: 65 %
PLATELETS: 213 10*3/uL (ref 150–400)
RBC: 3.23 MIL/uL — AB (ref 3.87–5.11)
RDW: 15.6 % — ABNORMAL HIGH (ref 11.5–15.5)
WBC: 11.9 10*3/uL — AB (ref 4.0–10.5)

## 2015-10-07 ENCOUNTER — Non-Acute Institutional Stay (SKILLED_NURSING_FACILITY): Payer: Medicare Other | Admitting: Internal Medicine

## 2015-10-07 ENCOUNTER — Encounter: Payer: Self-pay | Admitting: Internal Medicine

## 2015-10-07 DIAGNOSIS — R569 Unspecified convulsions: Secondary | ICD-10-CM | POA: Diagnosis not present

## 2015-10-07 DIAGNOSIS — I10 Essential (primary) hypertension: Secondary | ICD-10-CM | POA: Diagnosis not present

## 2015-10-07 DIAGNOSIS — N390 Urinary tract infection, site not specified: Secondary | ICD-10-CM | POA: Insufficient documentation

## 2015-10-07 DIAGNOSIS — R5381 Other malaise: Secondary | ICD-10-CM | POA: Insufficient documentation

## 2015-10-07 DIAGNOSIS — D509 Iron deficiency anemia, unspecified: Secondary | ICD-10-CM | POA: Diagnosis not present

## 2015-10-07 DIAGNOSIS — D72829 Elevated white blood cell count, unspecified: Secondary | ICD-10-CM | POA: Diagnosis not present

## 2015-10-07 DIAGNOSIS — IMO0002 Reserved for concepts with insufficient information to code with codable children: Secondary | ICD-10-CM

## 2015-10-07 DIAGNOSIS — M179 Osteoarthritis of knee, unspecified: Secondary | ICD-10-CM | POA: Diagnosis not present

## 2015-10-07 DIAGNOSIS — M1710 Unilateral primary osteoarthritis, unspecified knee: Secondary | ICD-10-CM

## 2015-10-07 LAB — URINE CULTURE

## 2015-10-07 NOTE — Progress Notes (Signed)
Patient ID: Alejandra Cruz, female   DOB: 1932/11/12, 81 y.o.   MRN: [id number]             FACILITY: [facility]     LEVEL OF CARE:   SNF  This is a routine visit   CHIEF COMPLAINT:    Medical management of chronic issues including history of left hip fracture-hypertension-osteoporosis-seizure disorder.    HISTORY OF PRESENT ILLNESS:     patient is a pleasant elderly resident who is here for rehabilitation after sustaining a left hip fracture that was surgically repaired  She was seen earlier this week by Dr. Dellia Nims secondary to an elevated white count of 13.9 t as well as abdominal discomfort.and malaise   she is no longer complaining of abdominal discomfort-an x-ray of her abdomen showed possible constipation otherwise no acute process.  She currently has no complaints--says she is feeling much better  She was started on ciprofloxacin empirically for possible UTI-culture appears to show multiple pathogens none predominant.  .       Her other medical issues appear to be quite stable she does have a history of hypertension is on diltiazem --recent blood pressures 134/63-130/64.   It appears she has bradycardic pulses at times in the 50s she iwas on a low-dose of diltiazem-she is asymptomatic does not complain of dizziness headache or syncopal-type feelings or palpitations--her  Diltiazem has been discontinued appears blood pressures are stable recent blood pressures have been in the 70s and 80s  She also has a history seizure disorder there been none recently she is on Keppra.  For osteoporosis continues on Fosamax as well as calcium.-Calcium is currently on hold secondary to her being on Cipro-   updated metabolic panel does show mild hypokalemia at 3.3--she is not on a diuretic   also has a history of iron deficiency anemia she is on iron hemoglobin have been stable in the past also recently 10.5 on lab done on December 28.                PAST MEDICAL HISTORY/PROBLEM LIST:                                Hyperlipidemia.    Hypertension.    Seizure disorder.    Restless legs syndrome.    Gastroesophageal reflux disease.    History of Helicobacter pylori, treated in 2010.    History of an esophageal web, dilated in 2010.    History of diverticulosis.    Osteoporosis.      Left hip fracture, previously noted.     PAST SURGICAL HISTORY:                 Vesicovaginal fistula closure in 1966.    Colonoscopy in October 2010.    Endoscopy in 2010.    Abdominal hysterectomy.    ORIF in June 2013.     Left hip partial arthroplasty on 11/19/2014.      CURRENT MEDICATIONS:   medications include:         Aspirin 81 mg daily.     Tylenol 650 q.6 p.r.n.       Fosamax 70 weekly.        Prozac 20 q.d.       Keppra 500 b.i.d.           Remeron 7.5 q.d.         Pravachol 20 mg q.d.  Multivitamin q.d.  Iron 325 mg twice a day   Calcium with vitamin D 1250 mg-200 units 3 times a day  She is also completing a short course of Cipro     SOCIAL HISTORY:                   HOUSING:  The patient she lives on her own.   She lives in some form of complex.  Apparently has 15 stairs to get in.   FUNCTIONAL STATUS: Probably has used a walker in the past     FAMILY HISTORY:                     MOTHER:  Cancer and diabetes in her mother.   Deceased.     FATHER:  Diabetes in her father.   Deceased.     SIBLINGS:  She has one brother who is alive with Parkinson's disease.     REVIEW OF SYSTEMS In general no complaints of fever or chills Skin-no complaints of rashes or itching               CHEST/RESPIRATORY:  No shortness of breath. Or cough CARDIAC:  No chest pain.  No palpitations.  No syncope or presyncope.   GI:  denies any abdominal pain nausea vomiting diarrhea or acute constipation  GU:  No dysuria.    MUSCULOSKELETAL:  Has complained of leg pain in the past but is not complaining of that  today  NEUROLOGICAL: --Does not complain of dizziness headache or syncopal-type episodes.    PHYSICAL EXAMINATION:   VITAL SIGNS:    Temperature is 97.3 pulse 77 respirations 20 blood pressure 134/63 weight is stable at 230.8 she had some weight gain previously but this appears to have stabilized over the last month or 2      GENERAL APPEARANCE:   Pleasant elderly female in no distress --sitting comfortably in her wheelchair  Her skin is warm and dry    HEENT: She has prescription lenses visual acuity appears grossly intact    MOUTH/THROAT:   She is edentulous his membranes are moist oropharynx is clear.     CHEST/RESPIRATORY:  clear to auscultation.  There is no wheezing.  Work of breathing appears to be normal.   CARDIOVASCULAR:   CARDIAC:  Heart sounds are normal.  Benign-sounding 1/6 systolic murmur.  -regular rate and rhythm.--minimal lower extremity edema   GASTROINTESTINAL:    Abdomen is soft nontender with positive bowel sounds              Musculoskeletal-is able to move all extremities 4 --I did not note any deformities strength appears to be intact-largely ambulates in a wheelchair she does use a walker at times        Neurologic is grossly intact her speech is clear-no lateralizing findings  Psych she is pleasant and appropriate apparently there are some mild cognitive deficits   .  Labs  The summer 28 2016.  WBC 13.9 hemoglobin 10.5 platelets 212.  10/06/2015.  Sodium 141 potassium 3.3 BUN 20 creatinine 0.64.  Albumin 2.6 otherwise liver function tests within normal limits  06/22/2015.  Sodium 143 potassium 4.3 BUN 20 creatinine 0.7 2.  05/13/2015.  WBC 4.3 hemoglobin 11.1 platelets 159.  Sodium 142 potassium 4 BUN 19 creatinine 0.66-of note calcium 7.9.  Albumin 3.2 otherwise liver function tests within normal limits    04/09/2015.  Sodium 143 potassium 4 BUN 19 creatinine 0.78.  WBC 4.0 hemoglobin 10.9  platelets  156.    03/10/2015.  Sodium 141 potassium 4 BUN 21 creatinine 0.73.  CBC 4.4 hemoglobin 11.6 platelets 173.  ESR-10.  Rheumatoid factor XXII.8  12/13/2014.  WBC 6.2 hemoglobin 8.3 platelets 359.  12/06/2014.  WBC 6.8 hemoglobin 7.9 platelets 447.  11/29/2014.  Sodium 141 potassium 4 BUN 20 creatinine 0.63.  Albumin 2.2 otherwise liver function tests within normal limits  11/29/2014.  WBC 6.4 hemoglobin 8.0 platelets 349.  Sodium 141 potassium 4 BUN 20 creatinine 0.63.  Albumin 2.2 otherwise liver function tests within normal limits    Assessment plan.  #1   Abdominal pain-this appears to have been transitory resolved x-ray did not really show anything acute-she denies any pain at this time-apparently she ate a decent breakfast and tolerated this okay-she did have an elevated white count however we will update this tomorrow-at this point will continue the Cipro empirically since she appears to be doing better-.  Urinalysis and culture  show multiple pathogens none predominant          #2-anemia-her hemoglobin appears relatively stable at 10.5 she is on iron     #3-history of left hip fracture with repair this appears to be stable she has seen orthopedics and is weightbearing as tolerated-she is on Fosamax as well as calcium supplementation.  #4 history seizure disorder there been no seizures for quite some time to my knowledge she is on Keppra.    #5 hyperlipidemia she is on a statin clipid panel in May was unremarkable with cholesterol 123 LDL 71 her function tests have been within normal limits--will update liver function tests as well as lipid panel   #6 history depression this is stable she is on Remeron  #7-hypertension-as noted above this appears to be stable she is now off Cardizem  #8 mild hypokalemia-will supplement this with 20 mEq of potassium for 3 days-she is not on a diuretic-will update a metabolic panel next week to keep an eye on  this.  #9 hyperlipidemia last lipid panel 09/02/2015 showed LDL of 63 cholesterol of 112 triglycerides of 44 HDL 40 at this point will monitor --recent  liver function tests have been unremarkable  #10 history of low calcium-her calcium appears to run in the high sevens low eights-most recently 7.6 on lab done earlier this week-she will need to have her calcium restarted once she is off the Cipro-will update a BMP as well  I also note on lab done last month her sedimentation rate was mildly elevated at 32-will update this as well         [id number]          .

## 2015-10-08 ENCOUNTER — Other Ambulatory Visit (HOSPITAL_COMMUNITY)
Admission: RE | Admit: 2015-10-08 | Discharge: 2015-10-08 | Disposition: A | Discharge status: Home / Self Care | Payer: Medicare Other | Source: Skilled Nursing Facility | Attending: Internal Medicine | Admitting: Internal Medicine

## 2015-10-08 DIAGNOSIS — I10 Essential (primary) hypertension: Secondary | ICD-10-CM | POA: Insufficient documentation

## 2015-10-08 LAB — CBC WITH DIFFERENTIAL/PLATELET
Basophils Absolute: 0 10*3/uL (ref 0.0–0.1)
Basophils Relative: 1 %
EOS ABS: 0.1 10*3/uL (ref 0.0–0.7)
Eosinophils Relative: 2 %
HEMATOCRIT: 31.2 % — AB (ref 36.0–46.0)
HEMOGLOBIN: 10.2 g/dL — AB (ref 12.0–15.0)
LYMPHS ABS: 1.5 10*3/uL (ref 0.7–4.0)
LYMPHS PCT: 19 %
MCH: 29.9 pg (ref 26.0–34.0)
MCHC: 32.7 g/dL (ref 30.0–36.0)
MCV: 91.5 fL (ref 78.0–100.0)
Monocytes Absolute: 1.2 10*3/uL — ABNORMAL HIGH (ref 0.1–1.0)
Monocytes Relative: 15 %
NEUTROS ABS: 4.8 10*3/uL (ref 1.7–7.7)
NEUTROS PCT: 63 %
Platelets: 277 10*3/uL (ref 150–400)
RBC: 3.41 MIL/uL — AB (ref 3.87–5.11)
RDW: 15.4 % (ref 11.5–15.5)
WBC: 7.6 10*3/uL (ref 4.0–10.5)

## 2015-10-08 LAB — SEDIMENTATION RATE: SED RATE: 97 mm/h — AB (ref 0–22)

## 2015-10-08 LAB — BASIC METABOLIC PANEL
ANION GAP: 5 (ref 5–15)
BUN: 18 mg/dL (ref 6–20)
CHLORIDE: 102 mmol/L (ref 101–111)
CO2: 32 mmol/L (ref 22–32)
CREATININE: 0.68 mg/dL (ref 0.44–1.00)
Calcium: 7.9 mg/dL — ABNORMAL LOW (ref 8.9–10.3)
GFR calc non Af Amer: >60 mL/min (ref >60)
Glucose, Bld: 91 mg/dL (ref 65–99)
POTASSIUM: 3.9 mmol/L (ref 3.5–5.1)
SODIUM: 139 mmol/L (ref 135–145)

## 2015-10-10 NOTE — Progress Notes (Addendum)
Patient ID: Alejandra Cruz, female   DOB: 05-16-1933, 82 y.o.   MRN: [id number]                PROGRESS NOTE  DATE:  10/05/2015         FACILITY: [facility]                  LEVEL OF CARE:   SNF   Acute Visit              CHIEF COMPLAINT:  Elevated white count, ?mild delirium.      HISTORY OF PRESENT ILLNESS:  This is a patient who has been in the building since the early part of this year.  She had fallen and underwent a left partial hip replacement.  She has not left the building.    She had a CBC done this morning, I think in follow-up of her hemoglobin.  This showed a hemoglobin of 10.5.  Her hemoglobin two weeks ago was 11.6.  Indices were normochromic, normocytic.  Unexpectedly, however, her white count is up to 13.9.  The nurse who showed me this thinks she has a UTI based on the fact that she seems more confused.    PAST MEDICAL HISTORY/PROBLEM LIST:  Past Medical History  Diagnosis Date  . Hyperlipidemia   . Hypertension   . Seizures (HCC) january 2010    Pt was running into the house to turn off the stove , fell and hit her head,. Since then she has developed seizure disorder   . Seizures (HCC)     Dr. Gerilyn Pilgrim   . Restless legs syndrome   . GERD (gastroesophageal reflux disease)   . Gastritis, Helicobacter pylori 2010    Rx. ABO for 10 days   . Esophageal web 2010    DILATION 16 MM  . Diverticulosis OCT 2010 TCS    SIGMOID COLON  . Hip fracture, left (HCC)     2016        PAST SURGICAL HISTORY:   Past Surgical History  Procedure Laterality Date  . Vesicovaginal fistula closure w/ tah  1966    bleeding   . Colonoscopy  OCT 2010    TORTUOUS, Sml IH, Tics, MULTIPLE SIMPLE ADENOMAS (<6MM)  . Upper gastrointestinal endoscopy  AUG 2010    SAVARY  . Abdominal hysterectomy    . Orif hip fracture  04/05/2012    Procedure: OPEN REDUCTION INTERNAL FIXATION HIP;  Surgeon: Vickki Hearing, MD;  Location: [facility];  Service: Orthopedics;  Laterality:  Right;  . Hip arthroplasty Left 11/19/2014    Procedure: LEFT PARTIAL HIP REPLACEMENT;  Surgeon: Vickki Hearing, MD;  Location: [facility];  Service: Orthopedics;  Laterality: Left;       CURRENT MEDICATIONS:  Medication list is reviewed.              Iron 325 p.o. b.i.d.     MiraLAX 17 g daily.    ASA 81 q.d.      Fluoxetine 20 mg daily.    Fosamax 70 q.weekly.     Hydrocodone 5/325, 1 p.o. q.6 hours p.r.n.     Keppra 500 mg twice a day.    Lovastatin 20 q.d.      Remeron 7.5 at bedtime.    REVIEW OF SYSTEMS:    CHEST/RESPIRATORY:  No cough.  No sputum.    CARDIAC:  No chest pain.   GI:  She is not spontaneously complaining of abdominal pain.  GU:  She does not spontaneously complain of dysuria.     PHYSICAL EXAMINATION:   GENERAL APPEARANCE:  The patient is lying in bed, in no distress.   CHEST/RESPIRATORY:  Clear air entry bilaterally.    CARDIOVASCULAR:   CARDIAC:  Heart sounds are normal.  Perhaps mild volume contraction.     GASTROINTESTINAL:   ABDOMEN:  Not distended.   Bowel sounds are positive.  She is diffusely tender, most markedly over the bladder and the right upper quadrant.  This is very difficult to quantitate and is not exactly reproducible.  She has no CVA tenderness.     GENITOURINARY:   BLADDER:  Again, suprapubic tenderness noted.      ASSESSMENT/PLAN:                   Leukocytosis.  This could be a UTI.  She did have suprapubic tenderness and, according to the nurse, does seem "more off" than usual.  She will need a urine, lab work, two views of the abdomen.  I will ask them to monitor her oral intake.  She may need IV fluids.

## 2015-10-14 ENCOUNTER — Encounter (HOSPITAL_COMMUNITY)
Admission: AD | Admit: 2015-10-14 | Discharge: 2015-10-14 | Disposition: A | Discharge status: Home / Self Care | Payer: Medicare Other | Source: Skilled Nursing Facility | Attending: Internal Medicine | Admitting: Internal Medicine

## 2015-10-14 DIAGNOSIS — I10 Essential (primary) hypertension: Secondary | ICD-10-CM | POA: Insufficient documentation

## 2016-01-31 ENCOUNTER — Encounter: Payer: Self-pay | Admitting: Internal Medicine

## 2016-01-31 ENCOUNTER — Non-Acute Institutional Stay (SKILLED_NURSING_FACILITY): Payer: Medicare Other | Admitting: Internal Medicine

## 2016-01-31 DIAGNOSIS — D509 Iron deficiency anemia, unspecified: Secondary | ICD-10-CM | POA: Diagnosis not present

## 2016-01-31 DIAGNOSIS — R569 Unspecified convulsions: Secondary | ICD-10-CM | POA: Diagnosis not present

## 2016-01-31 DIAGNOSIS — I10 Essential (primary) hypertension: Secondary | ICD-10-CM | POA: Diagnosis not present

## 2016-01-31 DIAGNOSIS — S72002D Fracture of unspecified part of neck of left femur, subsequent encounter for closed fracture with routine healing: Secondary | ICD-10-CM | POA: Diagnosis not present

## 2016-01-31 NOTE — Progress Notes (Signed)
Patient ID: Alejandra Cruz, female   DOB: 1933-02-18, 82 y.o.   MRN: [id number]  Location:  [facility]   Place of Service:  SNF [phone])   Alejandra Overman, MD  Patient Care Team: Kerri Perches, MD as PCP - General West Bali, MD (Gastroenterology)  Extended Emergency Contact Information Primary Emergency Contact: Alejandra Cruz Address: [address]          [place], [place] [address] [place] Home Phone: [phone] Mobile Phone: [phone] Relation: Daughter Secondary Emergency Contact: Vivien Presto [place] of [place] Mobile Phone: [phone] Relation: Son  Goals of care: Advanced Directive information Advanced Directives 01/31/2016  Does patient have an advance directive? Yes  Type of Advance Directive Living will  Does patient want to make changes to advanced directive? No - Patient declined  Copy of advanced directive(s) in chart? Yes  Would patient like information on creating an advanced directive? -     Chief Complaint  Patient presents with  . Acute Visit   Status Cruz fall with no reinjury.  Medical management of chronic medical issues including history of left hip fracture-hypertension-osteoporosis-seizure disorder  HPI:  Pt is a 82 y.o. female seen today for an acute visit for a fall the bathroom with no apparent injury she was found sitting on her buttocks-she states she was trying to get on the toilet apparently from her wheelchair and did not make it she denies hitting her head she denies having any hip pain leg pain or head pain she denies any loss of consciousness.  Patient has been quite stable with her other medical issues.  She came here initially for rehabilitation after sustaining a left hip fracture that was surgically repaired.  She has a history of hypertension   recent blood pressures appear to be stable 138/80 101/52.  In the past she had some bradycardia that was asymptomatic she was on  low-dose diltiazem this was discontinued-nonetheless blood pressures appear to be stable as noted above-  She also has a history seizure disorder but this has not really been an issue during her stay here she is on Keppra.  Regards to the left hip fracture she does ambulate at times with therapy with a walker otherwise uses a wheelchair-she continues on Fosamax as well as calcium for suspected osteoporosis.  She also has a history of iron deficiency anemia continues on iron twice a day--hemoglobin of 10.2 on lab done in late December appears to be relatively baseline.  At one point she had been gaining fairly significant amount await current weight 235.6 however appears to be relatively baseline this has moderated.  Currently she has no complaints again she did have the fall but has no complaints about this-continues ambulate about in her wheelchair this afternoon   Past Medical History  Diagnosis Date  . Hyperlipidemia   . Hypertension   . Seizures (HCC) january 2010    Pt was running into the house to turn off the stove , fell and hit her head,. Since then she has developed seizure disorder   . Seizures (HCC)     Dr. Gerilyn Pilgrim   . Restless legs syndrome   . GERD (gastroesophageal reflux disease)   . Gastritis, Helicobacter pylori 2010    Rx. ABO for 10 days   . Esophageal web 2010    DILATION 16 MM  . Diverticulosis OCT 2010 TCS    SIGMOID COLON  . Hip fracture, left (HCC)     2016  Past Surgical History  Procedure Laterality Date  . Vesicovaginal fistula closure w/ tah  1966    bleeding   . Colonoscopy  OCT 2010    TORTUOUS, Sml IH, Tics, MULTIPLE SIMPLE ADENOMAS (<6MM)  . Upper gastrointestinal endoscopy  AUG 2010    SAVARY  . Abdominal hysterectomy    . Orif hip fracture  04/05/2012    Procedure: OPEN REDUCTION INTERNAL FIXATION HIP;  Surgeon: Vickki Hearing, MD;  Location: [facility];  Service: Orthopedics;  Laterality: Right;  . Hip arthroplasty Left 11/19/2014     Procedure: LEFT PARTIAL HIP REPLACEMENT;  Surgeon: Vickki Hearing, MD;  Location: [facility];  Service: Orthopedics;  Laterality: Left;    No Known Allergies  Occasion.  Tylenol 650 mg every 6 hours when necessary.  Multivitamin daily.  Calcium carbonate 500-200 milligrams 3 times a day.  Fosamax 70 mg every weekly.  Vicodin 5-3 25 mg 2 tablets for severe pain every 6 hours-one tablet every 6 hours when necessary moderate pain.  Keppra 500 mg twice a day.  Lovastatin 20 mg daily at bedtime.  Phenergan 12.5 mg every 6 hours when necessary.  Prozac 20 mg daily.       Review of Systems   In general does not complaining of her fever chills weight appears to be relatively stable.  Skin does not complain of rashes or itching or wounds.  Head ears eyes nose mouth and throat-does not complain of any visual changes sore throat or nasal discharge.  Restrain no complaint shortness breath or cough.  Cardiac no chest pain no increased edema from baseline.  GI he is not complaining of any abdominal discomfort nausea vomiting diarrhea or constipation.  GU no dysuria.  Muscle skeletal status Cruz fall this afternoon but is not complaining of any leg hip arm pain or head pain.  Neurologic again does not complain of any dizziness syncopal-type feelings does not report any dizziness or syncopal-type feelings before her fall.  Psych does have a history of depression but this appears to be stable on Prozac.    Immunization History  Administered Date(s) Administered  . Influenza Split 07/25/2012  . Influenza,inj,Quad PF,36+ Mos 07/30/2013, 08/18/2014  . Pneumococcal Conjugate-13 04/29/2014  . Pneumococcal Polysaccharide-23 07/25/2012  . Tdap 08/08/2011   Pertinent  Health Maintenance Due  Topic Date Due  . INFLUENZA VACCINE  05/08/2016  . DEXA SCAN  Completed  . PNA vac Low Risk Adult  Completed   Fall Risk  04/29/2014 07/30/2013  Falls in the past year? Yes Yes  Number  falls in past yr: 1 2 or more  Injury with Fall? No -  Risk for fall due to : - Impaired balance/gait;Impaired mobility;History of fall(s)   Functional Status Survey:    Filed Vitals:   01/31/16 1450  BP: 138/80  Pulse: 57  Temp: 98.2 F (36.8 C)  TempSrc: Oral  Resp: 20  Height:  (1.702 m)  Weight: 191 lb (86.637 kg)   Body mass index is 29.91 kg/(m^2). Physical Exam   In general this is a pleasant elderly resident in no distress sitting comfortably in her wheelchair.  Her skin is warm and dry do not note any wounds rashes or abrasions.  Eyes pupils appear reactive to light sclera and conjunctiva are clear.  Oropharynx clear mucous membranes moist tongue is midline.  Chest is clear to auscultation there is no labored breathing.  Heart is regular rate and rhythm without murmur gallop or rub I did  get a pulse in the 60s today-there is a slight systolic murmur 1 out of 6-continues with fairly minimal lower extremity edema bilaterally.  Her abdomen soft somewhat obese nontender with pulse the bowel sounds.  Musculoskeletal does move all her extremities at baseline I did not note any deformities she does have lower extremity weakness which is not new is able to flex and extend at the hip without any pain I did not note any deformities grip strength appears to be intact bilaterally essentially baseline exam.  Neurologic is grossly intact no lateralizing findings her speech is clear.  In psych she is pleasant and appropriate with some mild cognitive deficits which are not new but she is pleasantly appropriate.    Labs reviewed:  Recent Labs  09/02/15 0725 10/06/15 0630 10/08/15 0800  NA 144 141 139  K 4.2 3.3* 3.9  CL 109 104 102  CO2 29 32 32  GLUCOSE 92 99 91  BUN 21* 20 18  CREATININE 0.70 0.64 0.68  CALCIUM 8.2* 7.6* 7.9*    Recent Labs  05/13/15 0630 09/02/15 0725 10/06/15 0630  AST 23 25 21   ALT 16 19 43  ALKPHOS 59 80 104  BILITOT 0.5 0.5 1.1    PROT 6.1* 6.5 6.3*  ALBUMIN 3.2* 3.3* 2.6*    Recent Labs  09/02/15 0725  10/05/15 0645 10/06/15 0630 10/08/15 0800  WBC 4.6  < > 13.9* 11.9* 7.6  NEUTROABS 1.7  --   --  7.7 4.8  HGB 11.9*  < > 10.5* 10.0* 10.2*  HCT 36.3  < > 31.6* 29.4* 31.2*  MCV 91.0  < > 90.8 91.0 91.5  PLT 172  < > 212 213 277  < > = values in this interval not displayed. Lab Results  Component Value Date   TSH 0.899 11/08/2014   No results found for: HGBA1C Lab Results  Component Value Date   CHOL 112 09/02/2015   HDL 40* 09/02/2015   LDLCALC 63 09/02/2015   TRIG 44 09/02/2015   CHOLHDL 2.8 09/02/2015    Significant Diagnostic Results in last 30 days:    Assessment/Plan  #1-history of fall-with no apparent injury physical exam was quite benign at this point will monitor not complaining of any arm or hip or leg pain-.  #2-history of left hip fracture with repair-again this appears to be stable not complaining of any hip pain the day but this will have to be watched she is on Fosamax as well as calcium supplementation with a history of osteoporosis.  #3 history seizure disorder this has been quite stable for considerable amount of time she is on Keppra.  #4 history hyperlipidemia she continues on a statin lipid panel in November showed a cholesterol 112 LDL 63 Will update this also will update her liver function tests.  #5 history of depression she is on Prozac this appears to be stable.  #6 history hypertension again this appears stable despite being off the diltiazem.  #7 history a low calcium most recent calcium 7.9 again she is on supplementation here will update a metabolic panel.  #8 history of iron deficiency anemia she is on iron Will update a CBC in December hemoglobin was 10.2 which appears relatively stable.    [id number]

## 2016-01-31 NOTE — Progress Notes (Signed)
Patient ID: Alejandra Cruz, female   DOB: 04/26/1933, 82 y.o.   MRN: [id number]

## 2016-02-01 ENCOUNTER — Encounter (HOSPITAL_COMMUNITY)
Admission: RE | Admit: 2016-02-01 | Discharge: 2016-02-01 | Disposition: A | Discharge status: Home / Self Care | Payer: Medicare Other | Source: Skilled Nursing Facility | Attending: Internal Medicine | Admitting: Internal Medicine

## 2016-02-01 DIAGNOSIS — M1990 Unspecified osteoarthritis, unspecified site: Secondary | ICD-10-CM | POA: Insufficient documentation

## 2016-02-01 DIAGNOSIS — K219 Gastro-esophageal reflux disease without esophagitis: Secondary | ICD-10-CM | POA: Insufficient documentation

## 2016-02-01 DIAGNOSIS — I10 Essential (primary) hypertension: Secondary | ICD-10-CM | POA: Insufficient documentation

## 2016-02-01 DIAGNOSIS — F429 Obsessive-compulsive disorder, unspecified: Secondary | ICD-10-CM | POA: Diagnosis present

## 2016-02-01 DIAGNOSIS — Z86718 Personal history of other venous thrombosis and embolism: Secondary | ICD-10-CM | POA: Diagnosis present

## 2016-02-01 LAB — CBC WITH DIFFERENTIAL/PLATELET
Basophils Absolute: 0 10*3/uL (ref 0.0–0.1)
Basophils Relative: 0 %
EOS PCT: 3 %
Eosinophils Absolute: 0.1 10*3/uL (ref 0.0–0.7)
HCT: 37.6 % (ref 36.0–46.0)
Hemoglobin: 12.3 g/dL (ref 12.0–15.0)
LYMPHS ABS: 2.2 10*3/uL (ref 0.7–4.0)
LYMPHS PCT: 45 %
MCH: 29.7 pg (ref 26.0–34.0)
MCHC: 32.7 g/dL (ref 30.0–36.0)
MCV: 90.8 fL (ref 78.0–100.0)
MONO ABS: 0.6 10*3/uL (ref 0.1–1.0)
MONOS PCT: 13 %
Neutro Abs: 1.9 10*3/uL (ref 1.7–7.7)
Neutrophils Relative %: 39 %
PLATELETS: 179 10*3/uL (ref 150–400)
RBC: 4.14 MIL/uL (ref 3.87–5.11)
RDW: 15.1 % (ref 11.5–15.5)
WBC: 4.9 10*3/uL (ref 4.0–10.5)

## 2016-02-01 LAB — COMPREHENSIVE METABOLIC PANEL
ALK PHOS: 115 U/L (ref 38–126)
ALT: 21 U/L (ref 14–54)
AST: 22 U/L (ref 15–41)
Albumin: 3.4 g/dL — ABNORMAL LOW (ref 3.5–5.0)
Anion gap: 8 (ref 5–15)
BILIRUBIN TOTAL: 0.5 mg/dL (ref 0.3–1.2)
BUN: 20 mg/dL (ref 6–20)
CALCIUM: 8.6 mg/dL — AB (ref 8.9–10.3)
CO2: 31 mmol/L (ref 22–32)
CREATININE: 0.88 mg/dL (ref 0.44–1.00)
Chloride: 103 mmol/L (ref 101–111)
GFR calc non Af Amer: >60 mL/min (ref >60)
Glucose, Bld: 88 mg/dL (ref 65–99)
Potassium: 3.9 mmol/L (ref 3.5–5.1)
SODIUM: 142 mmol/L (ref 135–145)
Total Protein: 6.6 g/dL (ref 6.5–8.1)

## 2016-02-01 LAB — LIPID PANEL
Cholesterol: 129 mg/dL (ref 0–200)
HDL: 40 mg/dL — ABNORMAL LOW (ref >40)
LDL CALC: 81 mg/dL (ref 0–99)
TRIGLYCERIDES: 42 mg/dL (ref <150)
Total CHOL/HDL Ratio: 3.2 RATIO
VLDL: 8 mg/dL (ref 0–40)

## 2016-03-06 ENCOUNTER — Non-Acute Institutional Stay (SKILLED_NURSING_FACILITY): Payer: Medicare Other | Admitting: Internal Medicine

## 2016-03-06 ENCOUNTER — Encounter: Payer: Self-pay | Admitting: Internal Medicine

## 2016-03-06 DIAGNOSIS — R569 Unspecified convulsions: Secondary | ICD-10-CM

## 2016-03-06 DIAGNOSIS — R600 Localized edema: Secondary | ICD-10-CM

## 2016-03-06 DIAGNOSIS — I10 Essential (primary) hypertension: Secondary | ICD-10-CM | POA: Diagnosis not present

## 2016-03-06 DIAGNOSIS — D509 Iron deficiency anemia, unspecified: Secondary | ICD-10-CM | POA: Diagnosis not present

## 2016-03-06 NOTE — Progress Notes (Signed)
Location:  [facility] Room Number: 145/D Place of Service:  SNF [phone]) Provider:  Valerie Roys, MD  Patient Care Team: Kerri Perches, MD as PCP - General [place] Bali, MD (Gastroenterology)  Extended Emergency Contact Information Primary Emergency Contact: [facility] Address: [address]          [name], [place] [address] [place] Home Phone: [phone] Mobile Phone: [phone] Relation: Daughter Secondary Emergency Contact: Vivien Presto [place] Mobile Phone: [phone] Relation: Son  Code Status:Full Code Goals of care: Advanced Directive information Advanced Directives 03/06/2016  Does patient have an advance directive? Yes  Type of Advance Directive -  Does patient want to make changes to advanced directive? No - Patient declined  Copy of advanced directive(s) in chart? Yes     Chief Complaint  Patient presents with  . Acute Visit    Patients c/o right leg swelling    HPI:  Pt is a 82 y.o. female seen today for an acute visit forComplaints of right leg swelling-she does not report any trauma here for increased pain says she just knows it is somewhat more swollen.  Per chart review it appears this is not really a new complaint fact venous Doppler back in November 2016 was negative for any DVT.  Her other medical issues appear to be quite stable she does have a history of hypertension-at one point she was on diltiazem but this was discontinued secondary to continued bradycardic readings-bradycardia appears to have largely resolved recent blood pressure 153/83 we will have to keep an eye on this will order blood pressure checks daily to see consistency here. Updated one today was satisfactory at 116/70  She does have a history of a left hip fracture with repair of this has stabilized not complaining of any hip pain for an extended period of time.  She is on Fosamax as  well as calcium supplementation with an apparent history of osteoporosis.  She is also on Keppra for history seizure disorder and I don't believe there been any seizure since she has been in the facility.  She also has a history hyperlipidemia LDL was 81 on lipid panel done approximately a month ago on April 26.  She also has a history of iron deficiency anemia but this showed significant improvement with a hemoglobin of 12.3 on lab done also on April 26.        Past Medical History  Diagnosis Date  . Hyperlipidemia   . Hypertension   . Seizures (HCC) january 2010    Pt was running into the house to turn off the stove , fell and hit her head,. Since then she has developed seizure disorder   . Seizures (HCC)     Dr. Gerilyn Pilgrim   . Restless legs syndrome   . GERD (gastroesophageal reflux disease)   . Gastritis, Helicobacter pylori 2010    Rx. ABO for 10 days   . Esophageal web 2010    DILATION 16 MM  . Diverticulosis OCT 2010 TCS    SIGMOID COLON  . Hip fracture, left (HCC)     2016   Past Surgical History  Procedure Laterality Date  . Vesicovaginal fistula closure w/ tah  1966    bleeding   . Colonoscopy  OCT 2010    TORTUOUS, Sml IH, Tics, MULTIPLE SIMPLE ADENOMAS (<6MM)  . Upper gastrointestinal endoscopy  AUG 2010    SAVARY  . Abdominal hysterectomy    .  Orif hip fracture  04/05/2012    Procedure: OPEN REDUCTION INTERNAL FIXATION HIP;  Surgeon: Vickki Hearing, MD;  Location: [facility];  Service: Orthopedics;  Laterality: Right;  . Hip arthroplasty Left 11/19/2014    Procedure: LEFT PARTIAL HIP REPLACEMENT;  Surgeon: Vickki Hearing, MD;  Location: [facility];  Service: Orthopedics;  Laterality: Left;    No Known Allergies  Current Outpatient Prescriptions on File Prior to Visit  Medication Sig Dispense Refill  . acetaminophen (TYLENOL) 325 MG tablet Take 2 tablets (650 mg total) by mouth every 6 (six) hours as needed for mild pain (or Fever >/= 101).    Marland Kitchen alendronate  (FOSAMAX) 70 MG tablet Take with full glass of water once a day on Sunday    . aspirin 81 MG tablet Take 81 mg by mouth daily.    . calcium-vitamin D (OSCAL WITH D) 500-200 MG-UNIT TABS TAKE (1) TABLET BY MOUTH (3) TIMES DAILY WITH MEALS. 90 each 3  . ferrous sulfate 325 (65 FE) MG tablet Take 325 mg by mouth 2 (two) times daily with a meal. At least one before or after calcium    . FLUoxetine (PROZAC) 20 MG capsule TAKE 1 CAPSULE BY MOUTH ONCE A DAY. 30 capsule 5  . HYDROcodone-acetaminophen (NORCO/VICODIN) 5-325 MG tablet Take 2 tablets by mouth every 6 (six) hours as needed for moderate pain or severe pain.    Marland Kitchen HYDROcodone-acetaminophen (NORCO/VICODIN) 5-325 MG tablet Take 1 tablet by mouth every 6 (six) hours as needed for moderate pain.    Marland Kitchen levETIRAcetam (KEPPRA) 500 MG tablet TAKE 1 TABLET BY MOUTH TWICE A DAY AS DIRECTED. 60 tablet 5  . lovastatin (MEVACOR) 20 MG tablet Take 1 tablet (20 mg total) by mouth at bedtime. 30 tablet 5  . Multiple Vitamin (MULTIVITAMIN) tablet Take 1 tablet by mouth daily.    . polyethylene glycol (MIRALAX / GLYCOLAX) packet Take 17 g by mouth daily. Hold for diarrhea    . ZINC OXIDE EX Apply topically. Apply zinc oxide every shift prn apply to left and right sides under the breast where the bra rest     No current facility-administered medications on file prior to visit.     Review of Systems   In general does not complain of any fever or chills.  Skin does not complain of rashes or itching.  Head ears eyes nose mouth and throat no complaints of visual changes sore throat.  Respiratory is not complaining of any shortness breath or cough.  Cardiac no chest pain again has had some increased edema at times right lower leg.  GI not complaining of any nausea vomiting diarrhea or constipation.  GU does not complain of dysuria.  Muscle skeletal is not really complaining of any joint pain or leg pain.  Neurologic does not complain of dizziness headache  or numbness.  Insight history depression that appears to be well controlled she is on Prozac  Immunization History  Administered Date(s) Administered  . Influenza Split 07/25/2012  . Influenza,inj,Quad PF,36+ Mos 07/30/2013, 08/18/2014  . Pneumococcal Conjugate-13 04/29/2014  . Pneumococcal Polysaccharide-23 07/25/2012  . Tdap 08/08/2011   Pertinent  Health Maintenance Due  Topic Date Due  . INFLUENZA VACCINE  05/08/2016  . DEXA SCAN  Completed  . PNA vac Low Risk Adult  Completed   Fall Risk  04/29/2014 07/30/2013  Falls in the past year? Yes Yes  Number falls in past yr: 1 2 or more  Injury with Fall? No -  Risk for fall due to : - Impaired balance/gait;Impaired mobility;History of fall(s)   Functional Status Survey:    Filed Vitals:   03/06/16 1253  BP: 153/83  Pulse: 67  Temp: 97.9 F (36.6 C)  TempSrc: Oral  Resp: 20  Height: 5\' 7"  (1.702 m)  Weight: 238 lb 12.8 oz (108.319 kg)  Of note update blood pressure is 116/70 Body mass index is 37.39 kg/(m^2). Physical Exam   In general is a pleasantly female in no distress sitting comfortably in her wheelchair.  Her skin is warm and dry.  Eyes she has prescription lenses pupils appear reactive to light sclera and conjunctiva appear clear.  Her oropharynx is clear mucous membranes moist.  Chest is clear to auscultation there is no labored breathing.  Heart is regular rate and rhythm without murmur gallop or rub pulse in the 60s today-possibly a slight systolic murmur 1 out of 6-she appears to have minimal pedal edema with positive pedal pulses-there is some mild increase in size of her right shin area compared to her left leg this is not erythematous it is not tender.  Abdomen is obese soft nontender positive bowel sounds.  Muscle skeletal ambulates in a wheelchair moves all her extremities 4 she has good strength bilaterally other than arthritic changes I do not see any abnormalities.  Neurologic is grossly  intact no lateralizing findings her speech is clear.  Psych she continues to be pleasant and appropriate smiling-has some mild cognitive deficits but is doing quite well with supportive care in this facility  Labs reviewed:  02/01/2016.  WBC 4.9 hemoglobin 12.3 platelets 179.  Sodium 142 potassium 3.9 BUN 20 creatinine 0.88.  Calcium 8.6-albumin 3.4-otherwise liver function tests within normal limits.  Cholesterol 129 triglycerides 42 HDL 40 LDL 81.    Recent Labs  10/06/15 0630 10/08/15 0800 02/01/16 0750  NA 141 139 142  K 3.3* 3.9 3.9  CL 104 102 103  CO2 32 32 31  GLUCOSE 99 91 88  BUN 20 18 20   CREATININE 0.64 0.68 0.88  CALCIUM 7.6* 7.9* 8.6*    Recent Labs  09/02/15 0725 10/06/15 0630 02/01/16 0750  AST 25 21 22   ALT 19 43 21  ALKPHOS 80 104 115  BILITOT 0.5 1.1 0.5  PROT 6.5 6.3* 6.6  ALBUMIN 3.3* 2.6* 3.4*    Recent Labs  10/06/15 0630 10/08/15 0800 02/01/16 0750  WBC 11.9* 7.6 4.9  NEUTROABS 7.7 4.8 1.9  HGB 10.0* 10.2* 12.3  HCT 29.4* 31.2* 37.6  MCV 91.0 91.5 90.8  PLT 213 277 179   Lab Results  Component Value Date   TSH 0.899 11/08/2014   No results found for: HGBA1C Lab Results  Component Value Date   CHOL 129 02/01/2016   HDL 40* 02/01/2016   LDLCALC 81 02/01/2016   TRIG 42 02/01/2016   CHOLHDL 3.2 02/01/2016    Significant Diagnostic Results in last 30 days:  No results found.  Assessment/Plan  #1 right leg edema-as noted above.  Doppler done back in November 2016 did not show any DVT-we'll redo this however to confirm-I suspect this could be somewhat dependent related I have encourage elevation patient does ambulate a large part of the day in her wheelchair suspect this may be contributing to it as well-I do not see any sign of infection tenderness or warmth  #2 question hypertension updated blood pressure appears satisfactory at 116/70-will order blood pressure checks daily with a log for review to keep an eye on  this  also will check her pulses daily these appear to have moderated since the diltiazem r was discontinued  #3-history seizure disorder again this has been stable for a considerable period of time she is on Keppra.  #4 hyperlipidemia this appears stable with an LDL of 81 on lab done on April 26 liver function tests were essentially unremarkable.  #5 history depression this is stable on Prozac.  #6 history of low calcium this has trended up on most recent lab at 8.6 continues on supplementation Will update a metabolic panel.  #7 history of iron deficiency anemia continues on iron hemoglobin show stability at 12.3 we will update this as well.  Number 8-- history of left hip fracture with repair-this appears to be stable she is on flax max as well as calcium supplementation with a history of osteoporosis  [id number].        London SheerLuster, Sally C, [place]MA [phone]

## 2016-03-07 ENCOUNTER — Encounter (HOSPITAL_COMMUNITY)
Admission: RE | Admit: 2016-03-07 | Discharge: 2016-03-07 | Disposition: A | Discharge status: Home / Self Care | Payer: Medicare Other | Source: Skilled Nursing Facility | Attending: Internal Medicine | Admitting: Internal Medicine

## 2016-03-07 DIAGNOSIS — Z86718 Personal history of other venous thrombosis and embolism: Secondary | ICD-10-CM | POA: Diagnosis present

## 2016-03-07 DIAGNOSIS — K219 Gastro-esophageal reflux disease without esophagitis: Secondary | ICD-10-CM | POA: Diagnosis present

## 2016-03-07 DIAGNOSIS — M1990 Unspecified osteoarthritis, unspecified site: Secondary | ICD-10-CM | POA: Insufficient documentation

## 2016-03-07 DIAGNOSIS — F429 Obsessive-compulsive disorder, unspecified: Secondary | ICD-10-CM | POA: Diagnosis present

## 2016-03-07 DIAGNOSIS — I10 Essential (primary) hypertension: Secondary | ICD-10-CM | POA: Diagnosis not present

## 2016-03-07 LAB — BASIC METABOLIC PANEL
ANION GAP: 7 (ref 5–15)
BUN: 19 mg/dL (ref 6–20)
CALCIUM: 8.6 mg/dL — AB (ref 8.9–10.3)
CHLORIDE: 102 mmol/L (ref 101–111)
CO2: 32 mmol/L (ref 22–32)
Creatinine, Ser: 0.71 mg/dL (ref 0.44–1.00)
GFR calc Af Amer: >60 mL/min (ref >60)
GFR calc non Af Amer: >60 mL/min (ref >60)
GLUCOSE: 87 mg/dL (ref 65–99)
POTASSIUM: 4 mmol/L (ref 3.5–5.1)
Sodium: 141 mmol/L (ref 135–145)

## 2016-03-07 LAB — CBC
HEMATOCRIT: 41 % (ref 36.0–46.0)
HEMOGLOBIN: 13.1 g/dL (ref 12.0–15.0)
MCH: 29.9 pg (ref 26.0–34.0)
MCHC: 32 g/dL (ref 30.0–36.0)
MCV: 93.6 fL (ref 78.0–100.0)
Platelets: 184 10*3/uL (ref 150–400)
RBC: 4.38 MIL/uL (ref 3.87–5.11)
RDW: 15.3 % (ref 11.5–15.5)
WBC: 6 10*3/uL (ref 4.0–10.5)

## 2016-03-16 ENCOUNTER — Other Ambulatory Visit: Payer: Self-pay

## 2016-03-16 MED ORDER — HYDROCODONE-ACETAMINOPHEN 5-325 MG PO TABS: ORAL_TABLET | ORAL | Status: DC

## 2016-03-16 NOTE — Telephone Encounter (Signed)
RX faxed to [facility] @ [phone]. Phone number [phone]  

## 2016-04-12 ENCOUNTER — Non-Acute Institutional Stay (SKILLED_NURSING_FACILITY): Payer: Medicare Other | Admitting: Internal Medicine

## 2016-04-12 ENCOUNTER — Encounter: Payer: Self-pay | Admitting: Internal Medicine

## 2016-04-12 DIAGNOSIS — G40909 Epilepsy, unspecified, not intractable, without status epilepticus: Secondary | ICD-10-CM

## 2016-04-12 DIAGNOSIS — M810 Age-related osteoporosis without current pathological fracture: Secondary | ICD-10-CM | POA: Diagnosis not present

## 2016-04-12 DIAGNOSIS — E785 Hyperlipidemia, unspecified: Secondary | ICD-10-CM | POA: Diagnosis not present

## 2016-04-12 DIAGNOSIS — I10 Essential (primary) hypertension: Secondary | ICD-10-CM | POA: Diagnosis not present

## 2016-04-12 NOTE — Assessment & Plan Note (Signed)
No change in therapy as no seizures reported

## 2016-04-12 NOTE — Patient Instructions (Addendum)
New orders for [employer] entry. HCTZ 12.5 mg qd TSH

## 2016-04-12 NOTE — Progress Notes (Signed)
    Facility Location: [facility] Room Number: 145/D   This is a nursing facility follow up of chronic medical diagnoses  Interim medical record and care since last [facility] visit was updated with review of diagnostic studies and change in clinical status since last visit were documented.  Note: only family and social history pertinent to this assessment are included.  HPI: The patient has history of dyslipidemia, hypertension, seizure disorder, GERD, osteoporosis, anemia and diverticulosis. 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 She has had significant hypocalcemia with calcium as low as 7.6. In May calcium was stable at 8.6.  HCT was 29.4 in December of last year; CBC was normal on 03/07/16. She is on alendronate for osteoporosis. The last TSH on record was 0.899 on 11/08/14. She is on no antihypertensives at this time. Blood pressure has ranged from 129/53-153/83. Renal function was normal in May. She is on lovastatin for dyslipidemia. Lipids were at goal on 4/26 except for HDL of 40. Liver function tests were normal at that time. No seizures reported on Keppra.   Comprehensive review of systems is totally negative except for intermittent early awakening w/o reason. Constitutional: No fever,significant weight change, fatigue  Eyes: No redness, discharge, pain, vision change ENT/mouth: No nasal congestion,  purulent discharge, earache,change in hearing ,sore throat  Cardiovascular: No chest pain, palpitations,paroxysmal nocturnal dyspnea, claudication, edema  Respiratory: No cough, sputum production,hemoptysis, DOE , significant snoring,apnea  Gastrointestinal: No heartburn,dysphagia,abdominal pain, nausea / vomiting,rectal bleeding, melena,change in bowels Genitourinary: No dysuria,hematuria, pyuria,  incontinence, nocturia Musculoskeletal: No joint stiffness, joint swelling, weakness,pain Dermatologic: No rash, pruritus, change in  appearance of skin Neurologic: No dizziness,headache,syncope, seizures, numbness , tingling Psychiatric: No significant anxiety , depression, insomnia, anorexia Endocrine: No change in hair/skin/ nails, excessive thirst, excessive hunger, excessive urination  Hematologic/lymphatic: No significant bruising, lymphadenopathy,abnormal bleeding Allergy/immunology: No itchy/ watery eyes, significant sneezing, urticaria, angioedema  Physical exam:  Pertinent or positive findings:Edentulous. Slow rhythm. Trace - 1/2+ ankle edema. General appearance:Adequately nourished; no acute distress , increased work of breathing is present.   Lymphatic: No lymphadenopathy about the head, neck, axilla . Eyes: No conjunctival inflammation or lid edema is present. There is no scleral icterus. Ears:  External ear exam shows no significant lesions or deformities.   Nose:  External nasal examination shows no deformity or inflammation. Nasal mucosa are pink and moist without lesions ,exudates Oral exam: lips and gums are healthy appearing.There is no oropharyngeal erythema or exudate . Neck:  No thyromegaly, masses, tenderness noted.    Heart:  regular rhythm. S1 and S2 normal without gallop, murmur, click, rub .  Lungs:Chest clear to auscultation without wheezes, rhonchi,rales , rubs. Abdomen:Bowel sounds are normal. Abdomen is soft and nontender with no organomegaly, hernias,masses. GU: deferred as previously addressed. Extremities:  No cyanosis, clubbing  Neurologic exam : Strength equal  in upper & lower extremities Balance,Rhomberg,finger to nose testing could not be completed due to clinical state Deep tendon reflexes are equal but 1/2+ in knees Skin: Warm & dry w/o tenting. No significant lesions or rash.    See summary under each active problem in the Problem List with associated updated therapeutic plan

## 2016-04-12 NOTE — Assessment & Plan Note (Signed)
Other than HDL of 40, lipids at goal on low-dose statin. Liver function tests normal. No change

## 2016-04-12 NOTE — Assessment & Plan Note (Addendum)
Add HCTZ 12.5 mg as the pressure is occasionally greater than 140/90 and she has edema

## 2016-04-12 NOTE — Assessment & Plan Note (Signed)
Recheck TSH 

## 2016-04-13 ENCOUNTER — Encounter (HOSPITAL_COMMUNITY)
Admission: AD | Admit: 2016-04-13 | Discharge: 2016-04-13 | Disposition: A | Discharge status: Home / Self Care | Payer: Medicare Other | Source: Skilled Nursing Facility | Attending: Internal Medicine | Admitting: Internal Medicine

## 2016-04-13 DIAGNOSIS — K219 Gastro-esophageal reflux disease without esophagitis: Secondary | ICD-10-CM | POA: Insufficient documentation

## 2016-04-13 DIAGNOSIS — M1990 Unspecified osteoarthritis, unspecified site: Secondary | ICD-10-CM | POA: Diagnosis present

## 2016-04-13 DIAGNOSIS — F429 Obsessive-compulsive disorder, unspecified: Secondary | ICD-10-CM | POA: Insufficient documentation

## 2016-04-13 DIAGNOSIS — I10 Essential (primary) hypertension: Secondary | ICD-10-CM | POA: Diagnosis not present

## 2016-04-13 DIAGNOSIS — Z86718 Personal history of other venous thrombosis and embolism: Secondary | ICD-10-CM | POA: Diagnosis present

## 2016-04-13 LAB — URINALYSIS, ROUTINE W REFLEX MICROSCOPIC
Bilirubin Urine: NEGATIVE
Glucose, UA: NEGATIVE mg/dL
HGB URINE DIPSTICK: NEGATIVE
Ketones, ur: NEGATIVE mg/dL
Leukocytes, UA: NEGATIVE
Nitrite: NEGATIVE
Protein, ur: NEGATIVE mg/dL
SPECIFIC GRAVITY, URINE: 1.02 (ref 1.005–1.030)
pH: 6 (ref 5.0–8.0)

## 2016-04-13 LAB — TSH: TSH: 1.11 u[IU]/mL (ref 0.350–4.500)

## 2016-04-15 LAB — URINE CULTURE

## 2016-05-07 ENCOUNTER — Non-Acute Institutional Stay (SKILLED_NURSING_FACILITY): Payer: Medicare Other | Admitting: Internal Medicine

## 2016-05-07 ENCOUNTER — Encounter: Payer: Self-pay | Admitting: Internal Medicine

## 2016-05-07 DIAGNOSIS — E785 Hyperlipidemia, unspecified: Secondary | ICD-10-CM

## 2016-05-07 DIAGNOSIS — R059 Cough, unspecified: Secondary | ICD-10-CM

## 2016-05-07 DIAGNOSIS — M179 Osteoarthritis of knee, unspecified: Secondary | ICD-10-CM | POA: Diagnosis not present

## 2016-05-07 DIAGNOSIS — I10 Essential (primary) hypertension: Secondary | ICD-10-CM | POA: Diagnosis not present

## 2016-05-07 DIAGNOSIS — R05 Cough: Secondary | ICD-10-CM | POA: Diagnosis not present

## 2016-05-07 DIAGNOSIS — M1710 Unilateral primary osteoarthritis, unspecified knee: Secondary | ICD-10-CM

## 2016-05-07 DIAGNOSIS — G40909 Epilepsy, unspecified, not intractable, without status epilepticus: Secondary | ICD-10-CM

## 2016-05-07 DIAGNOSIS — F418 Other specified anxiety disorders: Secondary | ICD-10-CM

## 2016-05-07 DIAGNOSIS — IMO0002 Reserved for concepts with insufficient information to code with codable children: Secondary | ICD-10-CM

## 2016-05-07 NOTE — Progress Notes (Signed)
Location:   [facility] Room Number: 145/D Place of Service:  SNF [phone]) Provider:  Valerie Roys, MD  Patient Care Team: Kerri Perches, MD as PCP - General [place] Bali, MD (Gastroenterology)  Extended Emergency Contact Information Primary Emergency Contact: [facility] Address: [address]          [name], [place] [address] [place] Home Phone: [phone] Mobile Phone: [phone] Relation: Daughter Secondary Emergency Contact: Vivien Presto [place] of [place] Mobile Phone: [phone] Relation: Son  Code Status: Full Code  Goals of care: Advanced Directive information Advanced Directives 05/07/2016  Does patient have an advance directive? Yes  Type of Advance Directive (No Data)  Does patient want to make changes to advanced directive? No - Patient declined  Copy of advanced directive(s) in chart? Yes  Would patient like information on creating an advanced directive? -  Pre-existing out of facility DNR order (yellow form or pink MOST form) -     Chief Complaint  Patient presents with  . Acute Visit    Cough and Wheezing  Medical management of chronic medical conditions including osteoporosis hypertension seizure disorder anemia hyperlipidemia  HPI:  Pt is a 82 y.o. female seen today for an acute visit for  Complaints of cough and occasional wheezing apparently this been going on over the weekend.  Also medical management of chronic medical conditions as noted above.  In regards to cough and wheezing she does not complain of any shortness of breath apparently the wheezing is transitory-the cough is nonproductive.  Vital signs are stable she has been afebrile O2 sats ration is in the mid 90s on room air.  Regards to her chronic medical conditions this appears to be fairly stable Dr. Alwyn Ren did recently see her and started her on hydrochlorothiazide secondary to some elevated systolics  at times this appears to be stable I see recent blood pressure 156/90 however baseline appears to be lower at 139/72-131/71-128/74  He does have a history seizure disorder but this has been stable for an extended period of time she is on Keppra.  Regards to anemia she is on iron and this appears stable as well with hemoglobin of 13.1 on lab done earlier this month.  Currently other than the cough she has no complaints  Past Medical History:  Diagnosis Date  . Diverticulosis OCT 2010 TCS   SIGMOID COLON  . Esophageal web 2010   DILATION 16 MM  . Gastritis, Helicobacter pylori 2010   Rx. ABO for 10 days   . GERD (gastroesophageal reflux disease)   . Hip fracture, left (HCC)    2016  . Hyperlipidemia   . Hypertension   . Restless legs syndrome   . Seizures [facility]) January 2010   Post head trauma in fall .sees Dr Gerilyn Pilgrim   Past Surgical History:  Procedure Laterality Date  . ABDOMINAL HYSTERECTOMY    . COLONOSCOPY  OCT 2010   TORTUOUS, Sml IH, Tics, MULTIPLE SIMPLE ADENOMAS (<6MM)  . HIP ARTHROPLASTY Left 11/19/2014   Procedure: LEFT PARTIAL HIP REPLACEMENT;  Surgeon: Vickki Hearing, MD;  Location: [facility];  Service: Orthopedics;  Laterality: Left;  . ORIF HIP FRACTURE  04/05/2012   Procedure: OPEN REDUCTION INTERNAL FIXATION HIP;  Surgeon: Vickki Hearing, MD;  Location: [facility];  Service: Orthopedics;  Laterality: Right;  . UPPER GASTROINTESTINAL ENDOSCOPY  AUG 2010   SAVARY  . VESICOVAGINAL FISTULA CLOSURE W/ TAH  1966  bleeding     No Known Allergies  Current Outpatient Prescriptions on File Prior to Visit  Medication Sig Dispense Refill  . acetaminophen (TYLENOL) 325 MG tablet Take 2 tablets (650 mg total) by mouth every 6 (six) hours as needed for mild pain (or Fever >/= 101).    Marland Kitchen alendronate (FOSAMAX) 70 MG tablet Take with full glass of water once a day on Sunday    . aspirin 81 MG tablet Take 81 mg by mouth daily.    . calcium-vitamin D (OSCAL WITH D) 500-200  MG-UNIT TABS TAKE (1) TABLET BY MOUTH (3) TIMES DAILY WITH MEALS. 90 each 3  . ferrous sulfate 325 (65 FE) MG tablet Take 325 mg by mouth 2 (two) times daily with a meal. At least one before or after calcium    . FLUoxetine (PROZAC) 20 MG capsule TAKE 1 CAPSULE BY MOUTH ONCE A DAY. 30 capsule 5  . HYDROcodone-acetaminophen (NORCO/VICODIN) 5-325 MG tablet 1 by mouth every 6 hours as needed for moderate pain, 2 by mouth every 6 hours as needed for severe pain DO NOT EXCEED 4GM OF APAP IN 24 HOURS FROM ALL SOURCES 240 tablet 0  . levETIRAcetam (KEPPRA) 500 MG tablet TAKE 1 TABLET BY MOUTH TWICE A DAY AS DIRECTED. 60 tablet 5  . lovastatin (MEVACOR) 20 MG tablet Take 1 tablet (20 mg total) by mouth at bedtime. 30 tablet 5  . Multiple Vitamin (MULTIVITAMIN) tablet Take 1 tablet by mouth daily.    . polyethylene glycol (MIRALAX / GLYCOLAX) packet Take 17 g by mouth daily. Hold for diarrhea    . ZINC OXIDE EX Apply topically. Apply zinc oxide every shift prn apply to left and right sides under the breast where the bra rest     No current facility-administered medications on file prior to visit.     Review of Systems   In general does not complain of any fever or chills.  Skin does not complain of rashes or itching.  Head ears eyes nose mouth and throat no complaints of visual changes sore throat.  Respiratory is not complaining of any wetness of breath but is complaining of a nonproductive cough and occasional wheezing  Cardiac no chest pain again has had some increased edema at times right lower leg.  GI not complaining of any nausea vomiting diarrhea or constipation.  GU does not complain of dysuria.  Muscle skeletal is not really complaining of any joint pain or leg pain.  Neurologic does not complain of dizziness headache or numbness.  Psych- history depression that appears to be well controlled she is on Prozac   Immunization History  Administered Date(s) Administered  .  Influenza Split 07/25/2012  . Influenza,inj,Quad PF,36+ Mos 07/30/2013, 08/18/2014  . PPD Test 12/06/2014  . Pneumococcal Conjugate-13 04/29/2014  . Pneumococcal Polysaccharide-23 07/25/2012  . Tdap 08/08/2011   Pertinent  Health Maintenance Due  Topic Date Due  . INFLUENZA VACCINE  05/08/2016  . DEXA SCAN  Completed  . PNA vac Low Risk Adult  Completed   Fall Risk  04/29/2014 07/30/2013  Falls in the past year? Yes Yes  Number falls in past yr: 1 2 or more  Injury with Fall? No -  Risk for fall due to : - Impaired balance/gait;Impaired mobility;History of fall(s)   Functional Status Survey:    Vitals:   05/07/16 1417  BP: (!) 150/92  Pulse: 89  Resp: 20  Temp: 98.2 F (36.8 C)  TempSrc: Oral  SpO2: 96%  Weight: 244 lb (110.7 kg)  Height: 5\' 7"  (1.702 m)  Of note recent blood pressures as noted above Body mass index is 38.22 kg/m. Physical Exam  .In general is a pleasantly female in no distress sitting comfortably in her wheelchair.  Her skin is warm and dry.  Eyes she has prescription lenses pupils appear reactive to light sclera and conjunctiva appear clear.  Her oropharynx is clear mucous membranes moist.  Chest is clear to auscultation there is no labored breathing.  Heart is regular rate and rhythm without murmur gallop or rub-continues with venous stasis changes mild lower extremity edema which appears to be relatively baseline    Abdomen is obese soft nontender positive bowel sounds.  Muscle skeletal ambulates in a wheelchair moves all her extremities 4 she has good strength bilaterally other than arthritic changes I do not see any abnormalities.  Neurologic is grossly intact no lateralizing findings her speech is clear.  Psych she continues to be pleasant and appropriate smiling-has some mild cognitive deficits but is doing quite well with supportive care in this facility nursing has not noted any issues    .   Labs  reviewed:  Recent Labs  10/08/15 0800 02/01/16 0750 03/07/16 0710  NA 139 142 141  K 3.9 3.9 4.0  CL 102 103 102  CO2 32 31 32  GLUCOSE 91 88 87  BUN 18 20 19   CREATININE 0.68 0.88 0.71  CALCIUM 7.9* 8.6* 8.6*    Recent Labs  09/02/15 0725 10/06/15 0630 02/01/16 0750  AST 25 21 22   ALT 19 43 21  ALKPHOS 80 104 115  BILITOT 0.5 1.1 0.5  PROT 6.5 6.3* 6.6  ALBUMIN 3.3* 2.6* 3.4*    Recent Labs  10/06/15 0630 10/08/15 0800 02/01/16 0750 03/07/16 0710  WBC 11.9* 7.6 4.9 6.0  NEUTROABS 7.7 4.8 1.9  --   HGB 10.0* 10.2* 12.3 13.1  HCT 29.4* 31.2* 37.6 41.0  MCV 91.0 91.5 90.8 93.6  PLT 213 277 179 184   Lab Results  Component Value Date   TSH 1.110 04/13/2016   No results found for: HGBA1C Lab Results  Component Value Date   CHOL 129 02/01/2016   HDL 40 (L) 02/01/2016   LDLCALC 81 02/01/2016   TRIG 42 02/01/2016   CHOLHDL 3.2 02/01/2016    Significant Diagnostic Results in last 30 days:  No results found.  Assessment/Plan . Cough-will order Mucinex 600 mg twice a day for 7 days as well as a 2 view chest x-ray-also monitor oxygen saturations with vital signs every shift for 72 hours.  #2 hypertension this appears relatively stable she is now on low-dose hydrochlorothiazide recent systolics appear to be largely in the 120 to 130s with occasional spikes above that but not consistent.  Since she is on hydrochlorothiazide will update her metabolic panel to keep an eye on her renal function and electrolytes.  #3 seizure disorder as stated above this is been stable for considerable amount of time she is on Keppra.  #4 history of osteoporosis with history of left hip fracture-she is on Fosamax as well as Os-Cal this appears to be stable.  #5 anemia thought to have an element of iron deficiency hemoglobin on recent lab but 03/07/2016 was 13.1 this appears to be stable as well will update   #6 depression she is on Prozac this has been stable for extended  period of time as well.  #7 hyperlipidemia she is on a statin recent liver function tests  appear to be unremarkable-cholesterol panel was stable back in April 2017 HDL was 40 cluster 129 LDL was 81.  Number a history of pain at times will complain of leg pain I suspect this is arthritic-she is on Vicodin this appears stable she is not complaining of pain this afternoon.  Addendum-I was called about the results of the chest x-ray which has not shown any acute process per nursing-nursing does report they have heard some wheezing however--and I have added duo nebs every 6 hours as needed when necessary.  [id number] note greater than 35 minutes spent assessing patient-discussing her status with nursing staff in person and by phone-reviewing her chart-reviewing her labs-and coordinating and formulating a plan of care for numerous diagnoses-of note greater than 50% of time spent coordinating plan of care.       London Sheer, [place] [phone]

## 2016-05-08 ENCOUNTER — Encounter (HOSPITAL_COMMUNITY)
Admission: RE | Admit: 2016-05-08 | Discharge: 2016-05-08 | Disposition: A | Discharge status: Home / Self Care | Payer: Medicare Other | Source: Skilled Nursing Facility | Attending: Internal Medicine | Admitting: Internal Medicine

## 2016-05-08 DIAGNOSIS — M1990 Unspecified osteoarthritis, unspecified site: Secondary | ICD-10-CM | POA: Diagnosis present

## 2016-05-08 DIAGNOSIS — F429 Obsessive-compulsive disorder, unspecified: Secondary | ICD-10-CM | POA: Diagnosis present

## 2016-05-08 DIAGNOSIS — I10 Essential (primary) hypertension: Secondary | ICD-10-CM | POA: Diagnosis not present

## 2016-05-08 DIAGNOSIS — K219 Gastro-esophageal reflux disease without esophagitis: Secondary | ICD-10-CM | POA: Diagnosis present

## 2016-05-08 LAB — CBC WITH DIFFERENTIAL/PLATELET
Basophils Absolute: 0 10*3/uL (ref 0.0–0.1)
Basophils Relative: 1 %
Eosinophils Absolute: 0.2 10*3/uL (ref 0.0–0.7)
Eosinophils Relative: 3 %
HEMATOCRIT: 37.9 % (ref 36.0–46.0)
Hemoglobin: 12.2 g/dL (ref 12.0–15.0)
LYMPHS ABS: 2.1 10*3/uL (ref 0.7–4.0)
LYMPHS PCT: 36 %
MCH: 30.3 pg (ref 26.0–34.0)
MCHC: 32.2 g/dL (ref 30.0–36.0)
MCV: 94.3 fL (ref 78.0–100.0)
MONO ABS: 0.6 10*3/uL (ref 0.1–1.0)
MONOS PCT: 11 %
NEUTROS ABS: 3 10*3/uL (ref 1.7–7.7)
Neutrophils Relative %: 49 %
Platelets: 169 10*3/uL (ref 150–400)
RBC: 4.02 MIL/uL (ref 3.87–5.11)
RDW: 14.1 % (ref 11.5–15.5)
WBC: 6 10*3/uL (ref 4.0–10.5)

## 2016-05-08 LAB — COMPREHENSIVE METABOLIC PANEL
ALT: 27 U/L (ref 14–54)
ANION GAP: 5 (ref 5–15)
AST: 32 U/L (ref 15–41)
Albumin: 3.4 g/dL — ABNORMAL LOW (ref 3.5–5.0)
Alkaline Phosphatase: 73 U/L (ref 38–126)
BILIRUBIN TOTAL: 0.5 mg/dL (ref 0.3–1.2)
BUN: 25 mg/dL — ABNORMAL HIGH (ref 6–20)
CALCIUM: 8.8 mg/dL — AB (ref 8.9–10.3)
CO2: 36 mmol/L — ABNORMAL HIGH (ref 22–32)
Chloride: 103 mmol/L (ref 101–111)
Creatinine, Ser: 0.74 mg/dL (ref 0.44–1.00)
GFR calc Af Amer: >60 mL/min (ref >60)
Glucose, Bld: 92 mg/dL (ref 65–99)
POTASSIUM: 4 mmol/L (ref 3.5–5.1)
Sodium: 144 mmol/L (ref 135–145)
TOTAL PROTEIN: 7 g/dL (ref 6.5–8.1)

## 2016-05-21 ENCOUNTER — Encounter: Payer: Self-pay | Admitting: Internal Medicine

## 2016-05-21 ENCOUNTER — Non-Acute Institutional Stay (SKILLED_NURSING_FACILITY): Payer: Medicare Other | Admitting: Internal Medicine

## 2016-05-21 DIAGNOSIS — R05 Cough: Secondary | ICD-10-CM | POA: Diagnosis not present

## 2016-05-21 DIAGNOSIS — R059 Cough, unspecified: Secondary | ICD-10-CM

## 2016-05-21 DIAGNOSIS — R600 Localized edema: Secondary | ICD-10-CM | POA: Diagnosis not present

## 2016-05-21 NOTE — Progress Notes (Signed)
Location:   [facility] Room Number: 145/D Place of Service:  SNF (31) Provider:  Full Code  Alejandra OvermanMargaret Simpson, MD  Patient Care Team: Alejandra PerchesMargaret E Simpson, MD as PCP - General Alejandra BaliSandi L Fields, MD (Gastroenterology)  Extended Emergency Contact Information Primary Emergency Contact: [facility] Address: [address]          [name], [place] [id number] [place] Home Phone: [phone] Mobile Phone: [phone] Relation: Daughter Secondary Emergency Contact: Alejandra PrestoMiller,Alejandra  [place] Mobile Phone: [phone] Relation: Son  Code Status:  Full Code Goals of care: Advanced Directive information Advanced Directives 05/21/2016  Does patient have an advance directive? Yes  Type of Advance Directive (No Data)  Does patient want to make changes to advanced directive? No - Patient declined  Copy of advanced directive(s) in chart? Yes  Would patient like information on creating an advanced directive? -  Pre-existing out of facility DNR order (yellow form or pink MOST form) -     Chief complaint acute visit follow-up cough-  right --lower extremity edema  HPI:  Pt is a [age] y.o. female seen today Follow-up of cough.  Prostate 2 weeks ago she did have significant cough we did order a chest x-ray which did not show any acute process.  However I was called later that day and said the patient did have some wheezing and we did start nebulizers in addition to the Mucinex that I had already ordered.  Today this appears essentially resolved she is not complaining of any increased shortness of breath I did not hear any wheezing on exam which is encouraging.  I know she has fairly significant right lower extremity edema however-I note we did do a venous Doppler on May 31 which did not show any right leg DVT she is not really complaining of any pain or tenderness with this area    Past Medical History:  Diagnosis Date  .  Diverticulosis OCT 2010 TCS   SIGMOID COLON  . Esophageal web 2010   DILATION 16 MM  . Gastritis, Helicobacter pylori 2010   Rx. ABO for 10 days   . GERD (gastroesophageal reflux disease)   . Hip fracture, left (HCC)    2016  . Hyperlipidemia   . Hypertension   . Restless legs syndrome   . Seizures [facility](HCC) January 2010   Post head trauma in fall .[place]   Past Surgical History:  Procedure Laterality Date  . ABDOMINAL HYSTERECTOMY    . COLONOSCOPY  OCT 2010   TORTUOUS, Sml IH, Tics, MULTIPLE SIMPLE ADENOMAS (<6MM)  . HIP ARTHROPLASTY Left 11/19/2014   Procedure: LEFT PARTIAL HIP REPLACEMENT;  Surgeon: Vickki HearingStanley E Harrison, MD;  Location: [facility];  Service: Orthopedics;  Laterality: Left;  . ORIF HIP FRACTURE  04/05/2012   Procedure: OPEN REDUCTION INTERNAL FIXATION HIP;  Surgeon: Vickki HearingStanley E Harrison, MD;  Location: [facility];  Service: Orthopedics;  Laterality: Right;  . UPPER GASTROINTESTINAL ENDOSCOPY  AUG 2010   SAVARY  . VESICOVAGINAL FISTULA CLOSURE W/ TAH  1966   bleeding     No Known Allergies  Current Outpatient Prescriptions on File Prior to Visit  Medication Sig Dispense Refill  . acetaminophen (TYLENOL) 325 MG tablet Take 2 tablets (650 mg total) by mouth every 6 (six) hours as needed for mild pain (or Fever >/= 101).    Marland Kitchen. alendronate (FOSAMAX) 70 MG tablet Take with full glass of water once a day on Sunday    .  aspirin 81 MG tablet Take 81 mg by mouth daily.    . calcium-vitamin D (OSCAL WITH D) 500-200 MG-UNIT TABS TAKE (1) TABLET BY MOUTH (3) TIMES DAILY WITH MEALS. 90 each 3  . ferrous sulfate 325 (65 FE) MG tablet Take 325 mg by mouth 2 (two) times daily with a meal. At least one before or after calcium    . FLUoxetine (PROZAC) 20 MG capsule TAKE 1 CAPSULE BY MOUTH ONCE A DAY. 30 capsule 5  . hydrochlorothiazide (HYDRODIURIL) 12.5 MG tablet Take 12.5 mg by mouth daily.    Marland Kitchen HYDROcodone-acetaminophen (NORCO/VICODIN) 5-325 MG tablet 1 by mouth every 6 hours as  needed for moderate pain, 2 by mouth every 6 hours as needed for severe pain DO NOT EXCEED 4GM OF APAP IN 24 HOURS FROM ALL SOURCES 240 tablet 0  . levETIRAcetam (KEPPRA) 500 MG tablet TAKE 1 TABLET BY MOUTH TWICE A DAY AS DIRECTED. 60 tablet 5  . lovastatin (MEVACOR) 20 MG tablet Take 1 tablet (20 mg total) by mouth at bedtime. 30 tablet 5  . Multiple Vitamin (MULTIVITAMIN) tablet Take 1 tablet by mouth daily.    . polyethylene glycol (MIRALAX / GLYCOLAX) packet Take 17 g by mouth daily. Hold for diarrhea    . ZINC OXIDE EX Apply topically. Apply zinc oxide every shift prn apply to left and right sides under the breast where the bra rest     No current facility-administered medications on file prior to visit.      Review of Systems  I  In general does not complain of any fever or chills.  Skin does not complain of rashes or itching.  Head ears eyes nose mouth and throat no complaints of visual changes sore throat.  Respiratory is not complaining\of any shortness of breath or cough  Cardiac no chest pain again has had some increased edema at times right lower leg. This appears to have increased again on exam this evening  GI not complaining of any nausea vomiting diarrhea or constipation.  GU does not complain of dysuria.  Muscle skeletal is not really complaining of any joint pain or leg pain.  Neurologic does not complain of dizziness headache or numbness.  Psych- history depression that appears to be well controlled she is on Prozac        Immunization History  Administered Date(s) Administered  . Influenza Split 07/25/2012  . Influenza,inj,Quad PF,36+ Mos 07/30/2013, 08/18/2014  . PPD Test 12/06/2014  . Pneumococcal Conjugate-13 04/29/2014  . Pneumococcal Polysaccharide-23 07/25/2012  . Tdap 08/08/2011   Pertinent  Health Maintenance Due  Topic Date Due  . INFLUENZA VACCINE  09/07/2016 (Originally 05/08/2016)  . DEXA SCAN  Completed  . PNA vac Low  Risk Adult  Completed   Fall Risk  04/29/2014 07/30/2013  Falls in the past year? Yes Yes  Number falls in past yr: 1 2 or more  Injury with Fall? No -  Risk for fall due to : - Impaired balance/gait;Impaired mobility;History of fall(s)   Functional Status Survey:    Vitals:   05/21/16 1506  BP: 138/73  Pulse: 79  Resp: (!) 96  Temp: 98.3 F (36.8 C)  TempSrc: Oral  Weight: 251 lb 6.4 oz (114 kg)   Body mass index is 39.37 kg/m. Physical Exam In general is a pleasantly female in no distress sitting comfortably in her wheelchair.  Her skin is warm and dry.  Eyes she has prescription lenses  Visual acuity appears grossly intact.  Marland Kitchen  Oropharynx was difficult to assess since patient has been eating and still has food in her mouth  Chest is clear to auscultation there is no labored breathing.  Heart is regular rate and rhythm without murmur gallop or rub-continues with venous stasis changes but appears to have increased right lower extremity edema tonight I was a 2+ pedal pulses somewhat difficult to palpate I do not note any tenderness or erythema or significant warmth however   Abdomen is obese soft nontender positive bowel sounds.  Muscle skeletal ambulates in a wheelchair moves all her extremities 4 she has good strength bilaterally other than arthritic changes I do not see any abnormalities.  Neurologic is grossly intact no lateralizing findings her speech is clear.  Psych she continues to be pleasant and appropriate smiling-has some mild cognitive deficits but is doing quite well with supportive care  Labs reviewed:  Recent Labs  02/01/16 0750 03/07/16 0710 05/08/16 0731  NA 142 141 144  K 3.9 4.0 4.0  CL 103 102 103  CO2 31 32 36*  GLUCOSE 88 87 92  BUN 20 19 25*  CREATININE 0.88 0.71 0.74  CALCIUM 8.6* 8.6* 8.8*    Recent Labs  10/06/15 0630 02/01/16 0750 05/08/16 0731  AST 21 22 32  ALT 43 21 27  ALKPHOS 104 115 73  BILITOT 1.1 0.5  0.5  PROT 6.3* 6.6 7.0  ALBUMIN 2.6* 3.4* 3.4*    Recent Labs  10/08/15 0800 02/01/16 0750 03/07/16 0710 05/08/16 0731  WBC 7.6 4.9 6.0 6.0  NEUTROABS 4.8 1.9  --  3.0  HGB 10.2* 12.3 13.1 12.2  HCT 31.2* 37.6 41.0 37.9  MCV 91.5 90.8 93.6 94.3  PLT 277 179 184 169   Lab Results  Component Value Date   TSH 1.110 04/13/2016   No results found for: HGBA1C Lab Results  Component Value Date   CHOL 129 02/01/2016   HDL 40 (L) 02/01/2016   LDLCALC 81 02/01/2016   TRIG 42 02/01/2016   CHOLHDL 3.2 02/01/2016    Significant Diagnostic Results in last 30 days:  No results found.  Assessment/Plan #1-cough-this appears to have resolved status post Mucinex did receive nebulizers for wheezing but I do not see any evidence of that this evening at this point will monitor.  #2 right lower extremity edema as noted above we have done a Doppler earlier this year but the edema has fairly significant here tonight would like to order another Doppler just to make sure that we are not dealing with a DVT here she is not really complaining of any pain here or tenderness but it is fairly significant edema.  [id number]-of note greater than 25 minutes spent assessing patient-reviewing her chart-her labs-her diagnostic studies-and coordinating plan of care

## 2016-05-31 ENCOUNTER — Other Ambulatory Visit: Payer: Self-pay

## 2016-05-31 MED ORDER — HYDROCODONE-ACETAMINOPHEN 5-325 MG PO TABS: ORAL_TABLET | ORAL | 0 refills | Status: DC

## 2016-05-31 NOTE — Telephone Encounter (Signed)
RX faxed to [facility] @ [phone]. Phone number [phone]  

## 2016-07-03 IMAGING — DX DG HIP (WITH OR WITHOUT PELVIS) 2-3V*L*
3 series · 3 of 3 positions shown · non-contrast
Comparison: None.

CLINICAL DATA: Fall at home with left knee pain. Initial encounter.

EXAM:
LEFT HIP (WITH PELVIS) 2-3 VIEWS

[pelvis ap]
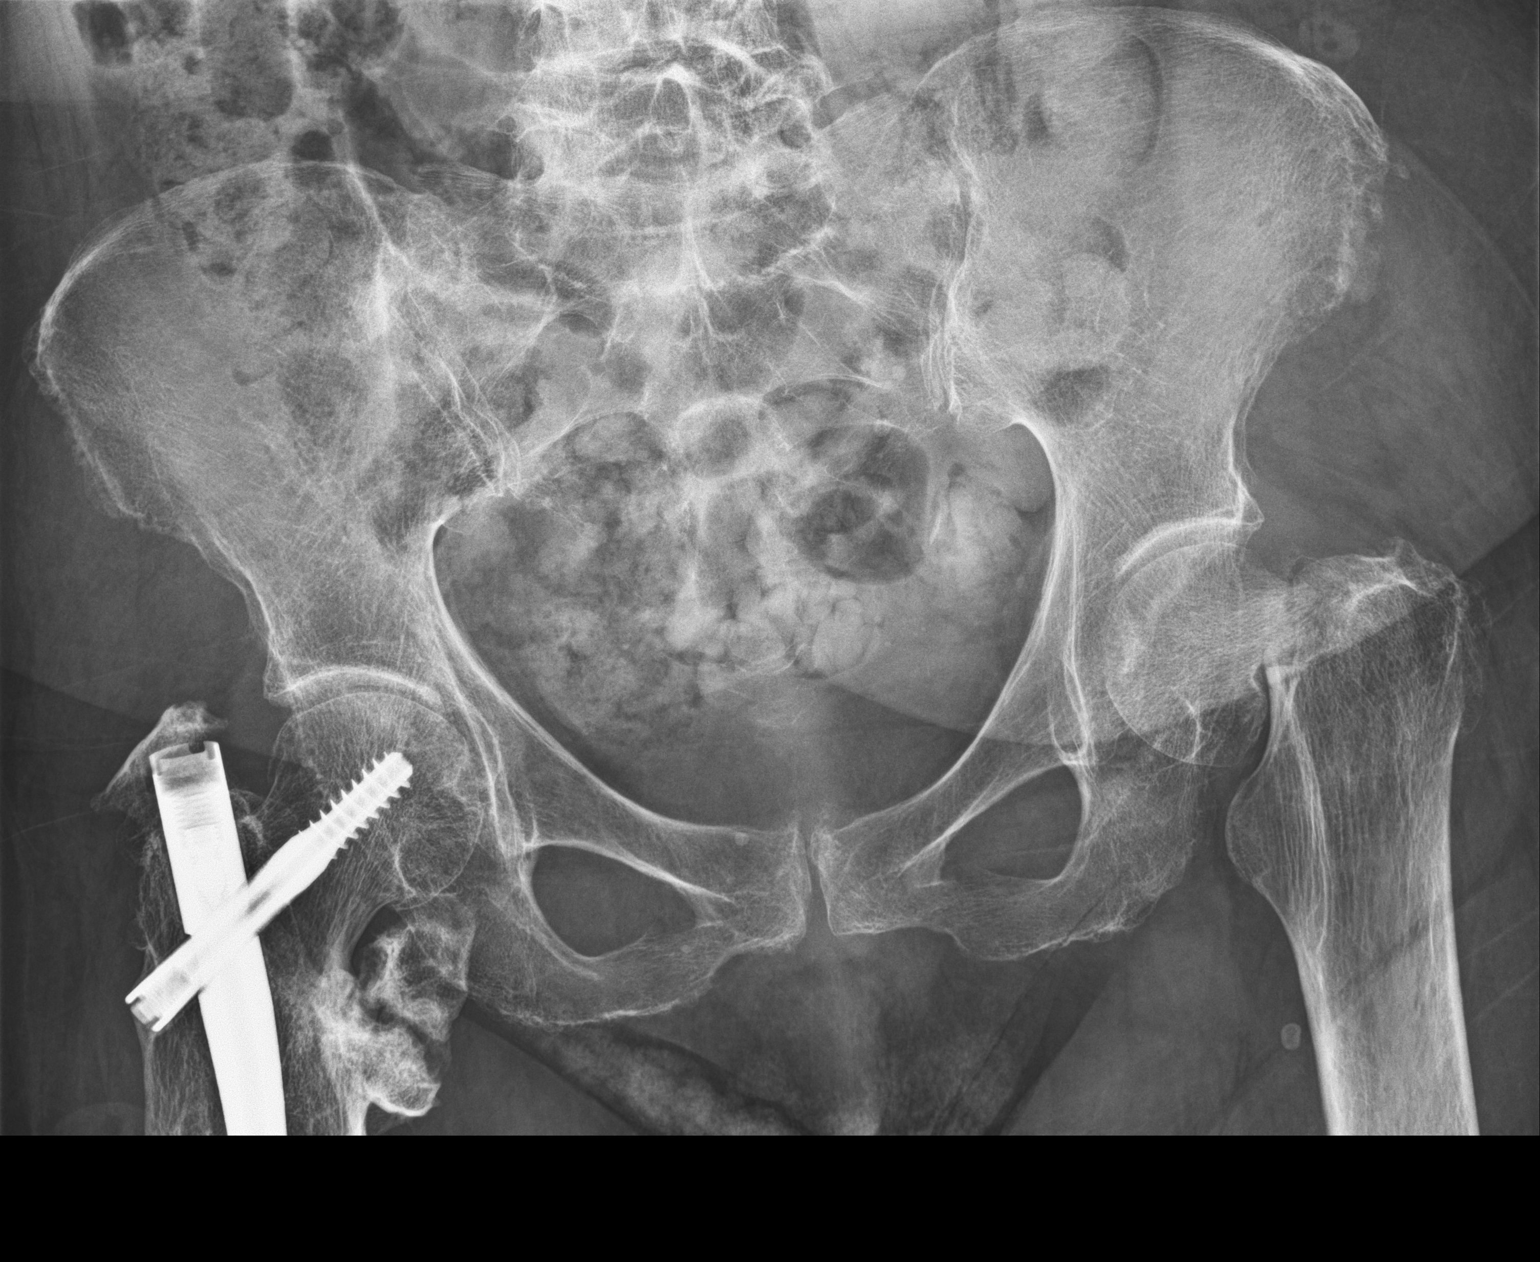

[hip ap]
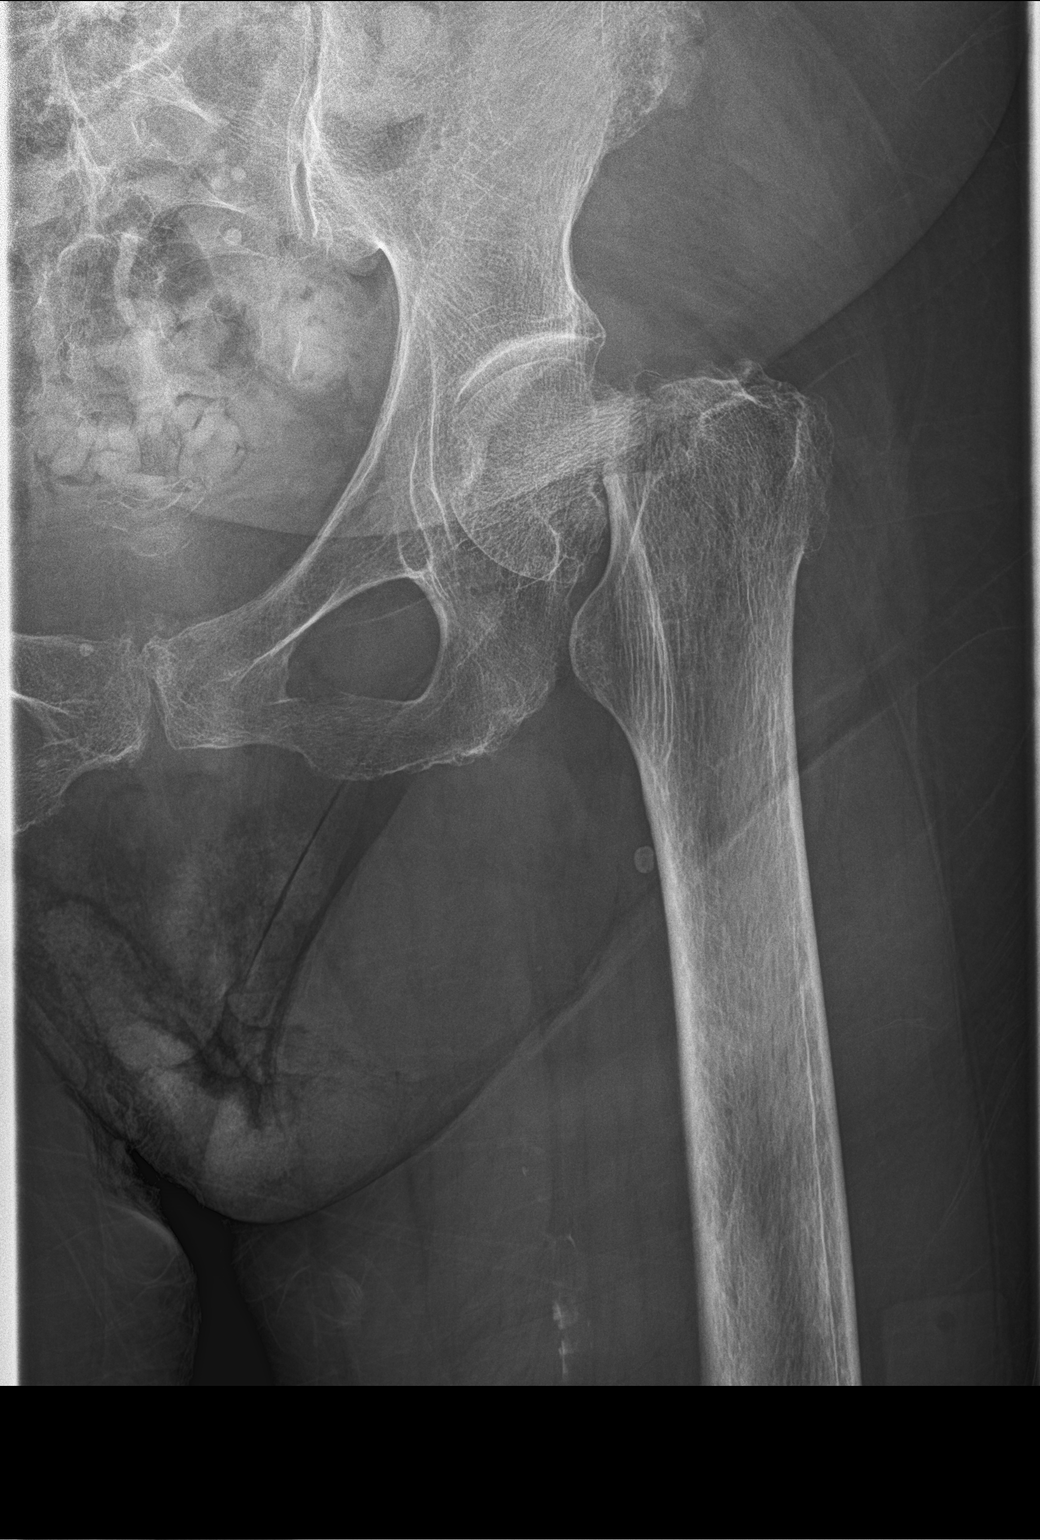

[hip lat]
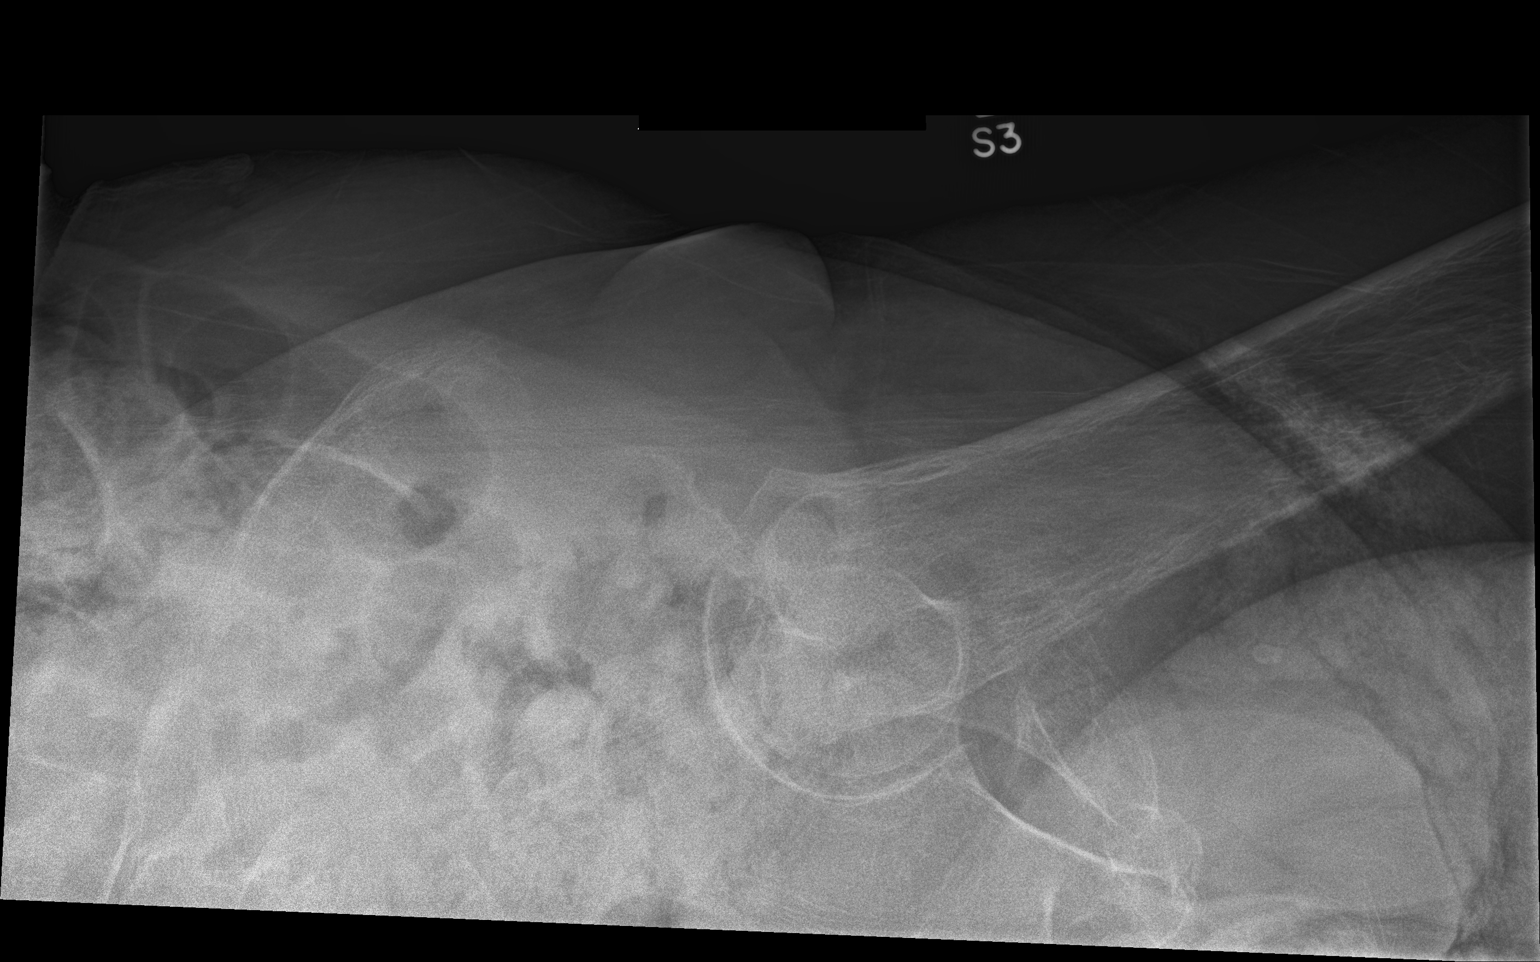

[3 of 3 positions shown; findings below may reference images not displayed]

FINDINGS: There is a left femoral neck fracture with varus angulation. The
left femoral head remains located. No evidence of acute pelvic ring
fracture.

Remote intertrochanteric right femur fracture status post ORIF.
There is prominent heterotopic ossification contiguous with the
distracted lesser trochanter fragment.

Marked osteopenia.
IMPRESSION: Displaced left femoral neck fracture.

## 2016-07-04 IMAGING — CR DG PORTABLE PELVIS
1 series · 1 of 1 positions shown · non-contrast
Comparison: None.

CLINICAL DATA: Left total hip replacement

EXAM:
PORTABLE PELVIS 1-2 VIEWS

[ap]
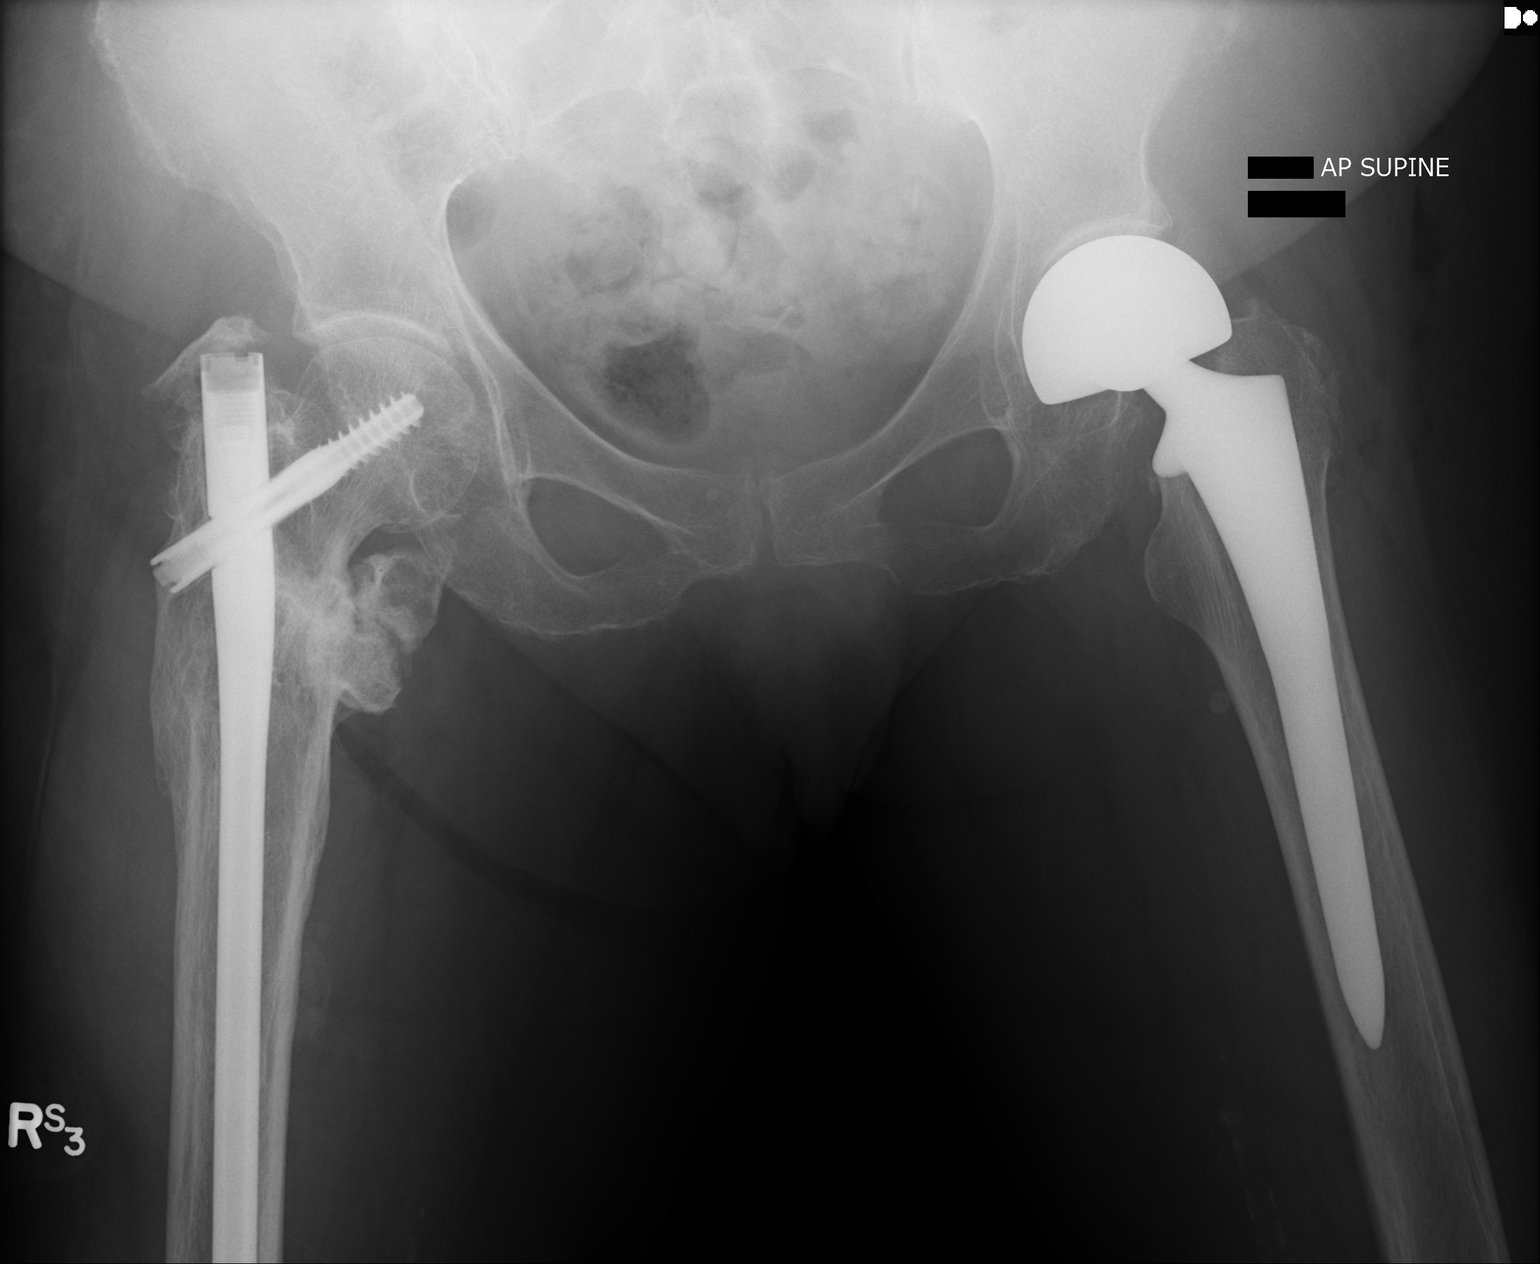

[1 of 1 positions shown; findings below may reference images not displayed]

FINDINGS: Bipolar left hip hemiarthroplasty. The prosthesis is located and
there is no periprosthetic fracture. The tip of the femoral stem is
noted to be eccentric within the femoral diaphysis.

Remote intertrochanteric, and possibly subtrochanteric, right femur
fracture with complete healing. There is prominent heterotopic
ossification about the lesser trochanteric fragment, but not
bridging the hip joint. Visible portions of the associated
prosthesis are intact. The visible pelvis is intact.
IMPRESSION: No acute findings related to new left hip arthroplasty.

## 2016-07-05 ENCOUNTER — Encounter: Payer: Self-pay | Admitting: Internal Medicine

## 2016-07-05 ENCOUNTER — Non-Acute Institutional Stay (SKILLED_NURSING_FACILITY): Payer: Medicare Other | Admitting: Internal Medicine

## 2016-07-05 DIAGNOSIS — R296 Repeated falls: Secondary | ICD-10-CM | POA: Diagnosis not present

## 2016-07-05 DIAGNOSIS — E785 Hyperlipidemia, unspecified: Secondary | ICD-10-CM

## 2016-07-05 DIAGNOSIS — M810 Age-related osteoporosis without current pathological fracture: Secondary | ICD-10-CM

## 2016-07-05 DIAGNOSIS — I10 Essential (primary) hypertension: Secondary | ICD-10-CM

## 2016-07-05 DIAGNOSIS — F418 Other specified anxiety disorders: Secondary | ICD-10-CM

## 2016-07-05 DIAGNOSIS — G40909 Epilepsy, unspecified, not intractable, without status epilepticus: Secondary | ICD-10-CM

## 2016-07-05 DIAGNOSIS — R600 Localized edema: Secondary | ICD-10-CM | POA: Diagnosis not present

## 2016-07-05 DIAGNOSIS — Z9181 History of falling: Secondary | ICD-10-CM

## 2016-07-05 NOTE — Progress Notes (Signed)
Location:   [facility] Room Number: 145/D Place of Service:  SNF (31) Provider:  Reymundo Poll, MD  Patient Care Team: Kerri Perches, MD as PCP - General West Bali, MD (Gastroenterology)  Extended Emergency Contact Information Primary Emergency Contact: [facility] Address: [address]          [name], [place] [address] [place] Home Phone: [phone] Mobile Phone: [phone] Relation: Daughter Secondary Emergency Contact: Vivien Presto [place] of [place] Mobile Phone: [phone] Relation: Son  Code Status:  Full Code Goals of care: Advanced Directive information Advanced Directives 07/05/2016  Does patient have an advance directive? Yes  Type of Advance Directive (No Data)  Does patient want to make changes to advanced directive? No - Patient declined  Copy of advanced directive(s) in chart? Yes  Would patient like information on creating an advanced directive? -  Pre-existing out of facility DNR order (yellow form or pink MOST form) -     Chief Complaint  Patient presents with  . Medical Management of Chronic Issues    Routine Visit    HPI:  Pt is a 82 y.o. female seen today for medical management of chronic diseases. Patient is long term resident of [facility].  She had some chronic swelling of her Right leg. Her Doppler twice with last one in 08/17 was negative. She also had some Cough which got resolved with nebulizer and Mucinex. Patient is doing well otherwise.  She continues to have edema right more then left. Also has some trouble sleeping at night. Pain seems to be well controlled.  Past Medical History:  Diagnosis Date  . Diverticulosis OCT 2010 TCS   SIGMOID COLON  . Esophageal web 2010   DILATION 16 MM  . Gastritis, Helicobacter pylori 2010   Rx. ABO for 10 days   . GERD (gastroesophageal reflux disease)   . Hip fracture, left (HCC)    2016  . Hyperlipidemia   .  Hypertension   . Restless legs syndrome   . Seizures [facility]) January 2010   Post head trauma in fall .sees Dr Gerilyn Pilgrim   Past Surgical History:  Procedure Laterality Date  . ABDOMINAL HYSTERECTOMY    . COLONOSCOPY  OCT 2010   TORTUOUS, Sml IH, Tics, MULTIPLE SIMPLE ADENOMAS (<6MM)  . HIP ARTHROPLASTY Left 11/19/2014   Procedure: LEFT PARTIAL HIP REPLACEMENT;  Surgeon: Vickki Hearing, MD;  Location: [facility];  Service: Orthopedics;  Laterality: Left;  . ORIF HIP FRACTURE  04/05/2012   Procedure: OPEN REDUCTION INTERNAL FIXATION HIP;  Surgeon: Vickki Hearing, MD;  Location: [facility];  Service: Orthopedics;  Laterality: Right;  . UPPER GASTROINTESTINAL ENDOSCOPY  AUG 2010   SAVARY  . VESICOVAGINAL FISTULA CLOSURE W/ TAH  1966   bleeding     No Known Allergies  Current Outpatient Prescriptions on File Prior to Visit  Medication Sig Dispense Refill  . acetaminophen (TYLENOL) 325 MG tablet Take 2 tablets (650 mg total) by mouth every 6 (six) hours as needed for mild pain (or Fever >/= 101).    Marland Kitchen alendronate (FOSAMAX) 70 MG tablet Take with full glass of water once a day on Sunday    . aspirin 81 MG tablet Take 81 mg by mouth daily.    . calcium-vitamin D (OSCAL WITH D) 500-200 MG-UNIT TABS TAKE (1) TABLET BY MOUTH (3) TIMES DAILY WITH MEALS. 90 each 3  . ferrous sulfate 325 (65 FE) MG tablet Take 325  mg by mouth 2 (two) times daily with a meal. At least one before or after calcium    . FLUoxetine (PROZAC) 20 MG capsule TAKE 1 CAPSULE BY MOUTH ONCE A DAY. 30 capsule 5  . hydrochlorothiazide (HYDRODIURIL) 12.5 MG tablet Take 12.5 mg by mouth daily.    Marland Kitchen. HYDROcodone-acetaminophen (NORCO/VICODIN) 5-325 MG tablet 1 by mouth every 6 hours as needed for moderate pain, 2 by mouth every 6 hours as needed for severe pain DO NOT EXCEED 3GM OF APAP IN 24 HOURS FROM ALL SOURCES 240 tablet 0  . levETIRAcetam (KEPPRA) 500 MG tablet TAKE 1 TABLET BY MOUTH TWICE A DAY AS DIRECTED. 60 tablet 5  . lovastatin  (MEVACOR) 20 MG tablet Take 1 tablet (20 mg total) by mouth at bedtime. 30 tablet 5  . Multiple Vitamin (MULTIVITAMIN) tablet Take 1 tablet by mouth daily.    . polyethylene glycol (MIRALAX / GLYCOLAX) packet Take 17 g by mouth daily. Hold for diarrhea    . ZINC OXIDE EX Apply topically. Apply zinc oxide every shift prn apply to left and right sides under the breast where the bra rest     No current facility-administered medications on file prior to visit.     Review of Systems  Constitutional: Negative for activity change, appetite change, chills, diaphoresis, fatigue and fever.  HENT: Negative.   Respiratory: Negative for apnea, cough, choking and shortness of breath.   Cardiovascular: Positive for leg swelling. Negative for chest pain and palpitations.  Gastrointestinal: Negative for abdominal distention, abdominal pain, anal bleeding, blood in stool, constipation, diarrhea and nausea.  Psychiatric/Behavioral: Positive for sleep disturbance. Negative for agitation, behavioral problems and confusion.    Immunization History  Administered Date(s) Administered  . Influenza Split 07/25/2012  . Influenza,inj,Quad PF,36+ Mos 07/30/2013, 08/18/2014  . PPD Test 12/06/2014  . Pneumococcal Conjugate-13 04/29/2014  . Pneumococcal Polysaccharide-23 07/25/2012  . Tdap 08/08/2011   Pertinent  Health Maintenance Due  Topic Date Due  . INFLUENZA VACCINE  09/07/2016 (Originally 05/08/2016)  . DEXA SCAN  Completed  . PNA vac Low Risk Adult  Completed   Fall Risk  04/29/2014 07/30/2013  Falls in the past year? Yes Yes  Number falls in past yr: 1 2 or more  Injury with Fall? No -  Risk for fall due to : - Impaired balance/gait;Impaired mobility;History of fall(s)   Functional Status Survey:    Vitals:   07/05/16 1541  BP: (!) 142/86  Pulse: 78  Resp: 20  Temp: 98.4 F (36.9 C)  TempSrc: Oral   There is no height or weight on file to calculate BMI. Physical Exam  Constitutional: She is  oriented to person, place, and time. She appears well-developed and well-nourished.  HENT:  Head: Normocephalic.  Mouth/Throat: Oropharynx is clear and moist.  Cardiovascular: Normal rate, regular rhythm and normal heart sounds.   Pulmonary/Chest: Effort normal and breath sounds normal. No respiratory distress. She has no wheezes. She has no rales.  Abdominal: Soft. Bowel sounds are normal. She exhibits no distension. There is no tenderness. There is no rebound.  Musculoskeletal:  B/L edema right more then left.  Neurological: She is alert and oriented to person, place, and time.  Good strength in All extremities.    Labs reviewed:  Recent Labs  02/01/16 0750 03/07/16 0710 05/08/16 0731  NA 142 141 144  K 3.9 4.0 4.0  CL 103 102 103  CO2 31 32 36*  GLUCOSE 88 87 92  BUN 20 19  25*  CREATININE 0.88 0.71 0.74  CALCIUM 8.6* 8.6* 8.8*    Recent Labs  10/06/15 0630 02/01/16 0750 05/08/16 0731  AST 21 22 32  ALT 43 21 27  ALKPHOS 104 115 73  BILITOT 1.1 0.5 0.5  PROT 6.3* 6.6 7.0  ALBUMIN 2.6* 3.4* 3.4*    Recent Labs  10/08/15 0800 02/01/16 0750 03/07/16 0710 05/08/16 0731  WBC 7.6 4.9 6.0 6.0  NEUTROABS 4.8 1.9  --  3.0  HGB 10.2* 12.3 13.1 12.2  HCT 31.2* 37.6 41.0 37.9  MCV 91.5 90.8 93.6 94.3  PLT 277 179 184 169   Lab Results  Component Value Date   TSH 1.110 04/13/2016   No results found for: HGBA1C Lab Results  Component Value Date   CHOL 129 02/01/2016   HDL 40 (L) 02/01/2016   LDLCALC 81 02/01/2016   TRIG 42 02/01/2016   CHOLHDL 3.2 02/01/2016    Significant Diagnostic Results in last 30 days:  No results found.  Assessment/Plan  Essential hypertension BP has been well controlled on HCTZ and Lovastatin Continue same dose. No change.  Osteoporosis, senile Patient os on Chronic Fosamax therapy. I could not find since how long. Patient did not know. It seems more likely since 2014. Should consider repeating Bone scan and reevaluate how  long she needs to be on it. Serum Calcium Normal  Bilateral lower extremity edema Edema is in Both legs more likely Chronic Venous stasis. Right is more then left probably due to her h/o right hip fracture.  At high risk for falls Patient well aware to call for help when she needs to get up from her wheel chair.  Depression with anxiety Stable on Prozac  Hyperlipemia Last LDL was 81 in 04/17 COntinuelovastatin  Seizure disorder  Stable om Levitiracetam   .   Family/ staff Communication:   Labs/tests ordered:

## 2016-08-01 ENCOUNTER — Other Ambulatory Visit: Payer: Self-pay | Admitting: *Deleted

## 2016-08-01 MED ORDER — HYDROCODONE-ACETAMINOPHEN 5-325 MG PO TABS: ORAL_TABLET | ORAL | 0 refills | Status: DC

## 2016-08-01 NOTE — Telephone Encounter (Signed)
[facility] #[phone] Fax: [phone]   

## 2016-08-02 ENCOUNTER — Encounter: Payer: Self-pay | Admitting: Internal Medicine

## 2016-08-02 ENCOUNTER — Non-Acute Institutional Stay (SKILLED_NURSING_FACILITY): Payer: Medicare Other | Admitting: Internal Medicine

## 2016-08-02 DIAGNOSIS — R600 Localized edema: Secondary | ICD-10-CM | POA: Diagnosis not present

## 2016-08-02 DIAGNOSIS — F418 Other specified anxiety disorders: Secondary | ICD-10-CM

## 2016-08-02 DIAGNOSIS — I10 Essential (primary) hypertension: Secondary | ICD-10-CM | POA: Diagnosis not present

## 2016-08-02 DIAGNOSIS — G40909 Epilepsy, unspecified, not intractable, without status epilepticus: Secondary | ICD-10-CM

## 2016-08-02 NOTE — Progress Notes (Signed)
Location:   [facility] Room Number: 145/D Place of Service:  SNF (31) Provider:  Valerie RoysArlo,Hideko Esselman  Margaret Simpson, MD  Patient Care Team: Kerri PerchesMargaret E Simpson, MD as PCP - General West BaliSandi L Fields, MD (Gastroenterology)  Extended Emergency Contact Information Primary Emergency Contact: [facility]Alfreda Address: [address]          [name], [place] [address] [place] Home Phone: [phone] Mobile Phone: [phone] Relation: Daughter Secondary Emergency Contact: Vivien PrestoMiller,Alfred  [place] Mobile Phone: [phone] Relation: Son  Code Status:  Full Code Goals of care: Advanced Directive information Advanced Directives 08/02/2016  Does patient have an advance directive? Yes  Type of Advance Directive (No Data)  Does patient want to make changes to advanced directive? No - Patient declined  Copy of advanced directive(s) in chart? Yes  Would patient like information on creating an advanced directive? -  Pre-existing out of facility DNR order (yellow form or pink MOST form) -     Chief Complaint  Patient presents with  . Medical Management of Chronic Issues    Routine Visit  For medical management of hypertension-seizure disorder-depression-anemia-chronic edema  HPI:  Pt is a 82 y.o. female seen today for medical management of chronic diseases. She continues to have a period of stability nursing does not report any acute issues  Currently she is sitting in her wheelchair comfortably continues to be bright alert talkative   She does have a history of right lower extremity edema Dopplers have been negative in the past this does not appear to be increased from baseline.  She does have a history seizure disorder on Keppra this has been stable for an extended period of time.  Her blood pressure also appears to be well controlled on hydrochlorothiazide recent blood pressures 132/67-117/56   Past Medical History:    Diagnosis Date  . Diverticulosis OCT 2010 TCS   SIGMOID COLON  . Esophageal web 2010   DILATION 16 MM  . Gastritis, Helicobacter pylori 2010   Rx. ABO for 10 days   . GERD (gastroesophageal reflux disease)   . Hip fracture, left (HCC)    2016  . Hyperlipidemia   . Hypertension   . Restless legs syndrome   . Seizures [facility](HCC) January 2010   Post head trauma in fall .sees Dr [place]ilyn [place]   Past Surgical History:  Procedure Laterality Date  . ABDOMINAL HYSTERECTOMY    . COLONOSCOPY  OCT 2010   TORTUOUS, Sml IH, Tics, MULTIPLE SIMPLE ADENOMAS (<6MM)  . HIP ARTHROPLASTY Left 11/19/2014   Procedure: LEFT PARTIAL HIP REPLACEMENT;  Surgeon: Vickki HearingStanley E Harrison, MD;  Location: [facility];  Service: Orthopedics;  Laterality: Left;  . ORIF HIP FRACTURE  04/05/2012   Procedure: OPEN REDUCTION INTERNAL FIXATION HIP;  Surgeon: Vickki HearingStanley E Harrison, MD;  Location: [facility];  Service: Orthopedics;  Laterality: Right;  . UPPER GASTROINTESTINAL ENDOSCOPY  AUG 2010   SAVARY  . VESICOVAGINAL FISTULA CLOSURE W/ TAH  1966   bleeding     No Known Allergies  Current Outpatient Prescriptions on File Prior to Visit  Medication Sig Dispense Refill  . acetaminophen (TYLENOL) 325 MG tablet Take 2 tablets (650 mg total) by mouth every 6 (six) hours as needed for mild pain (or Fever >/= 101).    Marland Kitchen. alendronate (FOSAMAX) 70 MG tablet Take with full glass of water once a day on Sunday    . aspirin 81 MG tablet Take 81 mg by mouth daily.    .Marland Kitchen  calcium-vitamin D (OSCAL WITH D) 500-200 MG-UNIT TABS TAKE (1) TABLET BY MOUTH (3) TIMES DAILY WITH MEALS. 90 each 3  . ferrous sulfate 325 (65 FE) MG tablet Take 325 mg by mouth 2 (two) times daily with a meal. At least one before or after calcium    . FLUoxetine (PROZAC) 20 MG capsule TAKE 1 CAPSULE BY MOUTH ONCE A DAY. 30 capsule 5  . hydrochlorothiazide (HYDRODIURIL) 12.5 MG tablet Take 12.5 mg by mouth daily.    Marland Kitchen HYDROcodone-acetaminophen (NORCO/VICODIN) 5-325 MG tablet Take one  tablet by mouth every 6 hours as needed for moderate pain or two tablets by mouth every 6 hours as needed for severe pain. Max APAP 3gm/24 hours from all sources 240 tablet 0  . levETIRAcetam (KEPPRA) 500 MG tablet TAKE 1 TABLET BY MOUTH TWICE A DAY AS DIRECTED. 60 tablet 5  . lovastatin (MEVACOR) 20 MG tablet Take 1 tablet (20 mg total) by mouth at bedtime. 30 tablet 5  . Multiple Vitamin (MULTIVITAMIN) tablet Take 1 tablet by mouth daily.    . polyethylene glycol (MIRALAX / GLYCOLAX) packet Take 17 g by mouth daily. Hold for diarrhea    . ZINC OXIDE EX Apply topically. Apply zinc oxide every shift prn apply to left and right sides under the breast where the bra rest     No current facility-administered medications on file prior to visit.     Review of Systems   In general does not complain of any fever or chills.  Skin does not complain of rashes or itching.  Head ears eyes nose mouth and throat no complaints of visual changes sore throat.  Respiratory is not complaining\of any shortness of breath or cough  Cardiac no chest pain again has had some increased edema at times right lower leg.  venous Dopplers have been negative for any DVT  GI not complaining of any nausea vomiting diarrhea or constipation.  GU does not complain of dysuria.  Muscle skeletal is not really complaining of any joint pain or leg pain. At times has complained of this in the past but this has been stable for some time  Neurologic does not complain of dizziness headache or numbness.  Psych-history depression that appears to be well controlled she is on Prozac     Immunization History  Administered Date(s) Administered  . Influenza Split 07/25/2012  . Influenza,inj,Quad PF,36+ Mos 07/30/2013, 08/18/2014  . Influenza-Unspecified 07/06/2016  . PPD Test 12/06/2014  . Pneumococcal Conjugate-13 04/29/2014  . Pneumococcal Polysaccharide-23 07/25/2012  . Tdap 08/08/2011   Pertinent  Health  Maintenance Due  Topic Date Due  . INFLUENZA VACCINE  Completed  . DEXA SCAN  Completed  . PNA vac Low Risk Adult  Completed   Fall Risk  04/29/2014 07/30/2013  Falls in the past year? Yes Yes  Number falls in past yr: 1 2 or more  Injury with Fall? No -  Risk for fall due to : - Impaired balance/gait;Impaired mobility;History of fall(s)   Functional Status Survey:    Vitals:   07/29/16 1539  BP: (!) 117/56  Pulse: 72  Resp: 20  Temp: 97.8 F (36.6 C)  TempSrc: Oral  SpO2: 96%  Weight is 255.6 pounds  Physical Exam   In general is a pleasantly female in no distress sitting comfortably in her wheelchair.  Her skin is warm and dry.  Eyes she has prescription lenses  Visual acuity appears grossly intact.  . Oropharynx was difficult to assess since patient  has been eating and still has food in her mouth  Chest is clear to auscultation there is no labored breathing.  Heart is regular rate and rhythm without murmur gallop or rub-continues with venous stasis changes With baseline edema of her lower extremities right greater than left there is no tenderness or warmth or increased erythema from baseline   Abdomen is obese soft nontender positive bowel sounds.  Muscle skeletal ambulates in a wheelchair moves all her extremities 4 she has good strength bilaterally other than arthritic changes I do not see any abnormalities.  Neurologic is grossly intact no lateralizing findings her speech is clear.  Psych she continues to be pleasant and appropriate smiling-has some mild cognitive deficits but is doing quite well with supportive care  Labs reviewed:  Recent Labs  02/01/16 0750 03/07/16 0710 05/08/16 0731  NA 142 141 144  K 3.9 4.0 4.0  CL 103 102 103  CO2 31 32 36*  GLUCOSE 88 87 92  BUN 20 19 25*  CREATININE 0.88 0.71 0.74  CALCIUM 8.6* 8.6* 8.8*    Recent Labs  10/06/15 0630 02/01/16 0750 05/08/16 0731  AST 21 22 32  ALT 43 21 27  ALKPHOS  104 115 73  BILITOT 1.1 0.5 0.5  PROT 6.3* 6.6 7.0  ALBUMIN 2.6* 3.4* 3.4*    Recent Labs  10/08/15 0800 02/01/16 0750 03/07/16 0710 05/08/16 0731  WBC 7.6 4.9 6.0 6.0  NEUTROABS 4.8 1.9  --  3.0  HGB 10.2* 12.3 13.1 12.2  HCT 31.2* 37.6 41.0 37.9  MCV 91.5 90.8 93.6 94.3  PLT 277 179 184 169   Lab Results  Component Value Date   TSH 1.110 04/13/2016   No results found for: HGBA1C Lab Results  Component Value Date   CHOL 129 02/01/2016   HDL 40 (L) 02/01/2016   LDLCALC 81 02/01/2016   TRIG 42 02/01/2016   CHOLHDL 3.2 02/01/2016    Significant Diagnostic Results in last 30 days:  No results found.  Assessment/Plan    #1 hypertension this appears stable on hydrochlorothiazide we will update a metabolic panel.  #2 seizure disorder has been stable she is on Keppra.  #3 hyperlipidemia this appears well controlled lipid panel back in April 2017 showed LDL of 81 HDL 40 cholesterol total 129 she is on lovastatin 20 mg a day-recommendation to check this yearly.  #4 history of osteoporosis she is on Fosamax as well as calcium supplementation.  #5 history of anemia this appears stable with a hemoglobin of 12.2 on lab done in August will update this she is on iron twice a day.  #6 history of chronic edema this appears stable against Dopplers have been negative in the past for the increased right leg edema.  #7 history of left hip fracture again she is on calcium supplementation as well as Fosamax.  #8 history of depression this appears quite stable Prozac.  [id number]

## 2016-08-03 ENCOUNTER — Encounter (HOSPITAL_COMMUNITY)
Admission: RE | Admit: 2016-08-03 | Discharge: 2016-08-03 | Disposition: A | Discharge status: Home / Self Care | Payer: Medicare Other | Source: Skilled Nursing Facility | Attending: Internal Medicine | Admitting: Internal Medicine

## 2016-08-03 DIAGNOSIS — I10 Essential (primary) hypertension: Secondary | ICD-10-CM | POA: Insufficient documentation

## 2016-08-03 LAB — COMPREHENSIVE METABOLIC PANEL
ALK PHOS: 111 U/L (ref 38–126)
ALT: 39 U/L (ref 14–54)
AST: 27 U/L (ref 15–41)
Albumin: 3.9 g/dL (ref 3.5–5.0)
Anion gap: 5 (ref 5–15)
BUN: 21 mg/dL — AB (ref 6–20)
CALCIUM: 8.8 mg/dL — AB (ref 8.9–10.3)
CO2: 32 mmol/L (ref 22–32)
CREATININE: 0.81 mg/dL (ref 0.44–1.00)
Chloride: 101 mmol/L (ref 101–111)
GFR calc non Af Amer: >60 mL/min (ref >60)
Glucose, Bld: 95 mg/dL (ref 65–99)
Potassium: 3.8 mmol/L (ref 3.5–5.1)
Sodium: 138 mmol/L (ref 135–145)
Total Bilirubin: 0.8 mg/dL (ref 0.3–1.2)
Total Protein: 7.8 g/dL (ref 6.5–8.1)

## 2016-08-03 LAB — CBC
HCT: 41.5 % (ref 36.0–46.0)
HEMOGLOBIN: 13.5 g/dL (ref 12.0–15.0)
MCH: 30.1 pg (ref 26.0–34.0)
MCHC: 32.5 g/dL (ref 30.0–36.0)
MCV: 92.6 fL (ref 78.0–100.0)
Platelets: 210 10*3/uL (ref 150–400)
RBC: 4.48 MIL/uL (ref 3.87–5.11)
RDW: 14.5 % (ref 11.5–15.5)
WBC: 6.3 10*3/uL (ref 4.0–10.5)

## 2016-08-21 ENCOUNTER — Non-Acute Institutional Stay (SKILLED_NURSING_FACILITY): Payer: Medicare Other | Admitting: Internal Medicine

## 2016-08-21 ENCOUNTER — Encounter: Payer: Self-pay | Admitting: Internal Medicine

## 2016-08-21 DIAGNOSIS — M25519 Pain in unspecified shoulder: Secondary | ICD-10-CM

## 2016-08-21 DIAGNOSIS — R05 Cough: Secondary | ICD-10-CM | POA: Diagnosis not present

## 2016-08-21 DIAGNOSIS — J209 Acute bronchitis, unspecified: Secondary | ICD-10-CM

## 2016-08-21 DIAGNOSIS — R600 Localized edema: Secondary | ICD-10-CM

## 2016-08-21 DIAGNOSIS — R059 Cough, unspecified: Secondary | ICD-10-CM

## 2016-08-21 NOTE — Progress Notes (Signed)
Location:   [facility] Room Number: 145/D Place of Service:  SNF (31) Provider:  Reymundo PollAnjali,Shaquisha Wynn  Margaret Simpson, MD  Patient Care Team: Kerri PerchesMargaret E Simpson, MD as PCP - General West BaliSandi L Fields, MD (Gastroenterology)  Extended Emergency Contact Information Primary Emergency Contact: [facility] Address: [address]          [name], [place] [address] [place] Home Phone: [phone] Mobile Phone: [phone] Relation: Daughter Secondary Emergency Contact: Vivien PrestoMiller,Alfred  [place] Mobile Phone: [phone] Relation: Son  Code Status:  Full Code Goals of care: Advanced Directive information Advanced Directives 08/21/2016  Does patient have an advance directive? Yes  Type of Advance Directive (No Data)  Does patient want to make changes to advanced directive? No - Patient declined  Copy of advanced directive(s) in chart? Yes  Would patient like information on creating an advanced directive? -  Pre-existing out of facility DNR order (yellow form or pink MOST form) -     Chief Complaint  Patient presents with  . Acute Visit    Wheezing    HPI:  Pt is a 82 y.o. female seen today for an acute visit for cough and wheezing. Patient has h/o Hypertension, Osteoporosis, Chronic LE edema, Depression and seizure disorder.   Patient says she woke up this morning and was coughing and noticed wheezing. Cough is  Mostly dry. She is not SOB or having any fever . No Chest pain.  Patient is also c/o her right shoulder hurting due to Arhritis. She got Tylenol for pain and that seems to help. She continues to also c/o right leg swelling. Previous w/u has been negative for any DVT.   Past Medical History:  Diagnosis Date  . Diverticulosis OCT 2010 TCS   SIGMOID COLON  . Esophageal web 2010   DILATION 16 MM  . Gastritis, Helicobacter pylori 2010   Rx. ABO for 10 days   . GERD (gastroesophageal reflux disease)   . Hip  fracture, left (HCC)    2016  . Hyperlipidemia   . Hypertension   . Restless legs syndrome   . Seizures [facility](HCC) January 2010   Post head trauma in fall .sees Dr Gerilyn [place]   Past Surgical History:  Procedure Laterality Date  . ABDOMINAL HYSTERECTOMY    . COLONOSCOPY  OCT 2010   TORTUOUS, Sml IH, Tics, MULTIPLE SIMPLE ADENOMAS (<6MM)  . HIP ARTHROPLASTY Left 11/19/2014   Procedure: LEFT PARTIAL HIP REPLACEMENT;  Surgeon: Vickki HearingStanley E Harrison, MD;  Location: [facility];  Service: Orthopedics;  Laterality: Left;  . ORIF HIP FRACTURE  04/05/2012   Procedure: OPEN REDUCTION INTERNAL FIXATION HIP;  Surgeon: Vickki HearingStanley E Harrison, MD;  Location: [facility];  Service: Orthopedics;  Laterality: Right;  . UPPER GASTROINTESTINAL ENDOSCOPY  AUG 2010   SAVARY  . VESICOVAGINAL FISTULA CLOSURE W/ TAH  1966   bleeding     No Known Allergies  Review of Systems  Constitutional: Negative for activity change, appetite change, chills, diaphoresis, fatigue, fever and unexpected weight change.  HENT: Negative for congestion, ear discharge, ear pain, postnasal drip, rhinorrhea, sinus pain, sinus pressure and sneezing.   Respiratory: Positive for cough and wheezing. Negative for apnea, choking, chest tightness, shortness of breath and stridor.   Cardiovascular: Positive for leg swelling. Negative for chest pain and palpitations.  Gastrointestinal: Negative for abdominal distention, abdominal pain, constipation, diarrhea, nausea, rectal pain and vomiting.  Musculoskeletal: Positive for arthralgias, back pain, joint swelling and myalgias.  Current Outpatient Prescriptions on File Prior to Visit  Medication Sig Dispense Refill  . acetaminophen (TYLENOL) 325 MG tablet Take 2 tablets (650 mg total) by mouth every 6 (six) hours as needed for mild pain (or Fever >/= 101).    Marland Kitchen. alendronate (FOSAMAX) 70 MG tablet Take with full glass of water once a day on Sunday    . aspirin 81 MG tablet Take 81 mg by mouth daily.    .  calcium-vitamin D (OSCAL WITH D) 500-200 MG-UNIT TABS TAKE (1) TABLET BY MOUTH (3) TIMES DAILY WITH MEALS. 90 each 3  . ferrous sulfate 325 (65 FE) MG tablet Take 325 mg by mouth daily with breakfast. At least one before or after calcium    . FLUoxetine (PROZAC) 20 MG capsule TAKE 1 CAPSULE BY MOUTH ONCE A DAY. 30 capsule 5  . hydrochlorothiazide (HYDRODIURIL) 12.5 MG tablet Take 12.5 mg by mouth daily.    Marland Kitchen. HYDROcodone-acetaminophen (NORCO/VICODIN) 5-325 MG tablet Take one tablet by mouth every 6 hours as needed for moderate pain or two tablets by mouth every 6 hours as needed for severe pain. Max APAP 3gm/24 hours from all sources 240 tablet 0  . levETIRAcetam (KEPPRA) 500 MG tablet TAKE 1 TABLET BY MOUTH TWICE A DAY AS DIRECTED. 60 tablet 5  . lovastatin (MEVACOR) 20 MG tablet Take 1 tablet (20 mg total) by mouth at bedtime. 30 tablet 5  . Multiple Vitamin (MULTIVITAMIN) tablet Take 1 tablet by mouth daily.    . polyethylene glycol (MIRALAX / GLYCOLAX) packet Take 17 g by mouth daily. Hold for diarrhea    . ZINC OXIDE EX Apply topically. Apply zinc oxide every shift prn apply to left and right sides under the breast where the bra rest     No current facility-administered medications on file prior to visit.     Immunization History  Administered Date(s) Administered  . Influenza Split 07/25/2012  . Influenza,inj,Quad PF,36+ Mos 07/30/2013, 08/18/2014  . Influenza-Unspecified 07/06/2016  . PPD Test 12/06/2014  . Pneumococcal Conjugate-13 04/29/2014  . Pneumococcal Polysaccharide-23 07/25/2012  . Tdap 08/08/2011   Pertinent  Health Maintenance Due  Topic Date Due  . INFLUENZA VACCINE  Completed  . DEXA SCAN  Completed  . PNA vac Low Risk Adult  Completed   Fall Risk  04/29/2014 07/30/2013  Falls in the past year? Yes Yes  Number falls in past yr: 1 2 or more  Injury with Fall? No -  Risk for fall due to : - Impaired balance/gait;Impaired mobility;History of fall(s)   Functional  Status Survey:    Vitals:   08/21/16 0932  BP: (!) 153/86  Pulse: 75  Resp: 20   There is no height or weight on file to calculate BMI. Physical Exam  Constitutional: She appears well-developed and well-nourished.  HENT:  Head: Normocephalic.  Nose: No mucosal edema or rhinorrhea.  Mouth/Throat: Oropharynx is clear and moist. No oropharyngeal exudate.  No sinus tenderness  Cardiovascular: Normal rate, regular rhythm and normal heart sounds.   Pulmonary/Chest: Breath sounds normal. No respiratory distress. She has no rales. She exhibits no tenderness.  Some expiratory Wheezing.  Abdominal: Soft. Bowel sounds are normal. She exhibits no distension. There is no tenderness. There is no rebound.  Musculoskeletal: She exhibits edema.  Arthritis in both hands. All the joints moving well. Patient cannot raise her Arms above shoulder due to pain.    Labs reviewed:  Recent Labs  03/07/16 0710 05/08/16 0731 08/03/16 0730  NA  141 144 138  K 4.0 4.0 3.8  CL 102 103 101  CO2 32 36* 32  GLUCOSE 87 92 95  BUN 19 25* 21*  CREATININE 0.71 0.74 0.81  CALCIUM 8.6* 8.8* 8.8*    Recent Labs  02/01/16 0750 05/08/16 0731 08/03/16 0730  AST 22 32 27  ALT 21 27 39  ALKPHOS 115 73 111  BILITOT 0.5 0.5 0.8  PROT 6.6 7.0 7.8  ALBUMIN 3.4* 3.4* 3.9    Recent Labs  10/08/15 0800 02/01/16 0750 03/07/16 0710 05/08/16 0731 08/03/16 0730  WBC 7.6 4.9 6.0 6.0 6.3  NEUTROABS 4.8 1.9  --  3.0  --   HGB 10.2* 12.3 13.1 12.2 13.5  HCT 31.2* 37.6 41.0 37.9 41.5  MCV 91.5 90.8 93.6 94.3 92.6  PLT 277 179 184 169 210   Lab Results  Component Value Date   TSH 1.110 04/13/2016   No results found for: HGBA1C Lab Results  Component Value Date   CHOL 129 02/01/2016   HDL 40 (L) 02/01/2016   LDLCALC 81 02/01/2016   TRIG 42 02/01/2016   CHOLHDL 3.2 02/01/2016    Significant Diagnostic Results in last 30 days:  No results found.  Assessment/Plan  Acute bronchitis  Patient  afebrile and seem in no respiratory distress.  Will start patient on Duo nebs Mucinex PRN.  Continue to monitor POX and Vitals.  Arthritis Continue Tylenol PRN.  LE edema Will try some ted hose. Patient is agreeable.  Family/ staff Communication:   Labs/tests ordered:

## 2016-09-11 ENCOUNTER — Encounter: Payer: Self-pay | Admitting: Internal Medicine

## 2016-09-11 ENCOUNTER — Non-Acute Institutional Stay (SKILLED_NURSING_FACILITY): Payer: Medicare Other | Admitting: Internal Medicine

## 2016-09-11 DIAGNOSIS — J449 Chronic obstructive pulmonary disease, unspecified: Secondary | ICD-10-CM

## 2016-09-11 DIAGNOSIS — F5101 Primary insomnia: Secondary | ICD-10-CM | POA: Diagnosis not present

## 2016-09-11 DIAGNOSIS — G40909 Epilepsy, unspecified, not intractable, without status epilepticus: Secondary | ICD-10-CM | POA: Diagnosis not present

## 2016-09-11 DIAGNOSIS — I10 Essential (primary) hypertension: Secondary | ICD-10-CM | POA: Diagnosis not present

## 2016-09-11 DIAGNOSIS — J4489 Other specified chronic obstructive pulmonary disease: Secondary | ICD-10-CM

## 2016-09-11 DIAGNOSIS — K5909 Other constipation: Secondary | ICD-10-CM | POA: Diagnosis not present

## 2016-09-11 DIAGNOSIS — E785 Hyperlipidemia, unspecified: Secondary | ICD-10-CM

## 2016-09-11 NOTE — Progress Notes (Signed)
Location:   [facility] Room Number: 145/D Place of Service:  SNF [phone]) Provider:  Reymundo Poll, MD  Patient Care Team: Kerri Perches, MD as PCP - General [place] Bali, MD (Gastroenterology)  Extended Emergency Contact Information Primary Emergency Contact: [facility] Address: [address]          [name], [place] [address] [place] Home Phone: [phone] Mobile Phone: [phone] Relation: Daughter Secondary Emergency Contact: Vivien Presto [place] Mobile Phone: [phone] Relation: Son  Code Status:  Full Code Goals of care: Advanced Directive information Advanced Directives 09/11/2016  Does Patient Have a Medical Advance Directive? Yes  Type of Advance Directive (No Data)  Does patient want to make changes to medical advance directive? No - Patient declined  Copy of Healthcare Power of Attorney in Chart? -  Would patient like information on creating a medical advance directive? No - Patient declined  Pre-existing out of facility DNR order (yellow form or pink MOST form) -     Chief Complaint  Patient presents with  . Acute Visit    Wheezing  . Medical Management of Chronic Issues    Routine Visit    HPI:  Pt is a 82 y.o. female seen today for medical management of chronic diseases.  Patient has H/O Hypertension, Seizure disorder, Anemia, Chronic venous stasis,and arthritis. Patient has been stable in past visit. But has this chronic c/o Cough and wheezing in the morning. She got Nebs this morning and her wheezing is better. Her cough is mostly dry.She denies any chest pain, fever, or SOB.  Patient today also wants something to help her sleep. She says she can't sleep past 11 and stays up all night.  She also c/o Constipation and to strain to move her bowels..   Past Medical History:  Diagnosis Date  . Diverticulosis OCT 2010 TCS   SIGMOID COLON  . Esophageal web  2010   DILATION 16 MM  . Gastritis, Helicobacter pylori 2010   Rx. ABO for 10 days   . GERD (gastroesophageal reflux disease)   . Hip fracture, left (HCC)    2016  . Hyperlipidemia   . Hypertension   . Restless legs syndrome   . Seizures [facility]) January 2010   Post head trauma in fall .sees Dr Gerilyn Pilgrim   Past Surgical History:  Procedure Laterality Date  . ABDOMINAL HYSTERECTOMY    . COLONOSCOPY  OCT 2010   TORTUOUS, Sml IH, Tics, MULTIPLE SIMPLE ADENOMAS (<6MM)  . HIP ARTHROPLASTY Left 11/19/2014   Procedure: LEFT PARTIAL HIP REPLACEMENT;  Surgeon: Vickki Hearing, MD;  Location: [facility];  Service: Orthopedics;  Laterality: Left;  . ORIF HIP FRACTURE  04/05/2012   Procedure: OPEN REDUCTION INTERNAL FIXATION HIP;  Surgeon: Vickki Hearing, MD;  Location: [facility];  Service: Orthopedics;  Laterality: Right;  . UPPER GASTROINTESTINAL ENDOSCOPY  AUG 2010   SAVARY  . VESICOVAGINAL FISTULA CLOSURE W/ TAH  1966   bleeding     No Known Allergies  Current Outpatient Prescriptions on File Prior to Visit  Medication Sig Dispense Refill  . acetaminophen (TYLENOL) 325 MG tablet Take 2 tablets (650 mg total) by mouth every 6 (six) hours as needed for mild pain (or Fever >/= 101).    Marland Kitchen alendronate (FOSAMAX) 70 MG tablet Take with full glass of water once a day on Monday    . aspirin 81 MG tablet Take 81 mg by mouth daily.    Marland Kitchen  calcium-vitamin D (OSCAL WITH D) 500-200 MG-UNIT TABS TAKE (1) TABLET BY MOUTH (3) TIMES DAILY WITH MEALS. 90 each 3  . ferrous sulfate 325 (65 FE) MG tablet Take 325 mg by mouth daily with breakfast. At least one before or after calcium    . FLUoxetine (PROZAC) 20 MG capsule TAKE 1 CAPSULE BY MOUTH ONCE A DAY. 30 capsule 5  . hydrochlorothiazide (HYDRODIURIL) 12.5 MG tablet Take 12.5 mg by mouth daily.    Marland Kitchen. HYDROcodone-acetaminophen (NORCO/VICODIN) 5-325 MG tablet Take one tablet by mouth every 6 hours as needed for moderate pain or two tablets by mouth every 6 hours  as needed for severe pain. Max APAP 3gm/24 hours from all sources 240 tablet 0  . levETIRAcetam (KEPPRA) 500 MG tablet TAKE 1 TABLET BY MOUTH TWICE A DAY AS DIRECTED. 60 tablet 5  . lovastatin (MEVACOR) 20 MG tablet Take 1 tablet (20 mg total) by mouth at bedtime. 30 tablet 5  . Multiple Vitamin (MULTIVITAMIN) tablet Take 1 tablet by mouth daily.    . polyethylene glycol (MIRALAX / GLYCOLAX) packet Take 17 g by mouth daily. Hold for diarrhea    . ZINC OXIDE EX Apply topically. Apply zinc oxide every shift prn apply to left and right sides under the breast where the bra rest     No current facility-administered medications on file prior to visit.      Review of Systems  Constitutional: Negative for chills, diaphoresis, fatigue and fever.  HENT: Positive for congestion, postnasal drip and rhinorrhea. Negative for sinus pain.   Respiratory: Positive for cough and wheezing. Negative for apnea and shortness of breath.   Cardiovascular: Positive for leg swelling. Negative for chest pain and palpitations.  Gastrointestinal: Positive for constipation. Negative for abdominal distention, abdominal pain and nausea.  Genitourinary: Negative for dysuria and flank pain.  Musculoskeletal: Positive for arthralgias.  Neurological: Negative for dizziness, weakness and light-headedness.  Psychiatric/Behavioral: Positive for sleep disturbance. Negative for confusion and hallucinations. The patient is not nervous/anxious.     Immunization History  Administered Date(s) Administered  . Influenza Split 07/25/2012  . Influenza,inj,Quad PF,36+ Mos 07/30/2013, 08/18/2014  . Influenza-Unspecified 07/06/2016  . PPD Test 12/06/2014  . Pneumococcal Conjugate-13 04/29/2014  . Pneumococcal Polysaccharide-23 07/25/2012  . Tdap 08/08/2011   Pertinent  Health Maintenance Due  Topic Date Due  . INFLUENZA VACCINE  Completed  . DEXA SCAN  Completed  . PNA vac Low Risk Adult  Completed   Fall Risk  04/29/2014  07/30/2013  Falls in the past year? Yes Yes  Number falls in past yr: 1 2 or more  Injury with Fall? No -  Risk for fall due to : - Impaired balance/gait;Impaired mobility;History of fall(s)   Functional Status Survey:    Vitals:   09/11/16 0917  BP: (!) 136/94  Pulse: 88  Resp: 18  Temp: 98.1 F (36.7 C)  TempSrc: Oral   There is no height or weight on file to calculate BMI. Physical Exam  Constitutional: She appears well-developed and well-nourished.  HENT:  Head: Normocephalic.  Mouth/Throat: Oropharynx is clear and moist.  Cardiovascular: Normal rate, regular rhythm and normal heart sounds.   No murmur heard. Pulmonary/Chest: Effort normal and breath sounds normal. No respiratory distress. She has no wheezes. She has no rales.  Abdominal: Soft. Bowel sounds are normal. She exhibits no distension. There is no tenderness. There is no rebound and no guarding.  Musculoskeletal:  Had ted hoses on and edema was mild.  Neurological:  She is alert.  Psychiatric: She has a normal mood and affect. Her behavior is normal. Thought content normal.    Labs reviewed:  Recent Labs  03/07/16 0710 05/08/16 0731 08/03/16 0730  NA 141 144 138  K 4.0 4.0 3.8  CL 102 103 101  CO2 32 36* 32  GLUCOSE 87 92 95  BUN 19 25* 21*  CREATININE 0.71 0.74 0.81  CALCIUM 8.6* 8.8* 8.8*    Recent Labs  02/01/16 0750 05/08/16 0731 08/03/16 0730  AST 22 32 27  ALT 21 27 39  ALKPHOS 115 73 111  BILITOT 0.5 0.5 0.8  PROT 6.6 7.0 7.8  ALBUMIN 3.4* 3.4* 3.9    Recent Labs  10/08/15 0800 02/01/16 0750 03/07/16 0710 05/08/16 0731 08/03/16 0730  WBC 7.6 4.9 6.0 6.0 6.3  NEUTROABS 4.8 1.9  --  3.0  --   HGB 10.2* 12.3 13.1 12.2 13.5  HCT 31.2* 37.6 41.0 37.9 41.5  MCV 91.5 90.8 93.6 94.3 92.6  PLT 277 179 184 169 210   Lab Results  Component Value Date   TSH 1.110 04/13/2016   No results found for: HGBA1C Lab Results  Component Value Date   CHOL 129 02/01/2016   HDL 40 (L)  02/01/2016   LDLCALC 81 02/01/2016   TRIG 42 02/01/2016   CHOLHDL 3.2 02/01/2016    Significant Diagnostic Results in last 30 days:  No results found.  Assessment/Plan  Chronic Bronchitis  Patient has had this dry cough and wheezing in the morning for now more then 3 weeks.  Usually relieved with Nebs treatment. Will start patient on Spiriva. She is not interested in any PFTs right now. Will see if symptoms improve. Also will start on Zantac to rule out GERD as contributing factor.  Hypertension  BP controlled. She is tolerating low dose of HCTZ   Seizure disorder Controlled on Keppra.  Constipation Patient  will start her on colace.  Insomnia Will start her on Melatonin prn.     Family/ staff Communication:   Labs/tests ordered:

## 2016-09-17 ENCOUNTER — Encounter: Payer: Self-pay | Admitting: Internal Medicine

## 2016-09-17 ENCOUNTER — Non-Acute Institutional Stay (SKILLED_NURSING_FACILITY): Payer: Medicare Other | Admitting: Internal Medicine

## 2016-09-17 DIAGNOSIS — M25511 Pain in right shoulder: Secondary | ICD-10-CM | POA: Diagnosis not present

## 2016-09-17 NOTE — Progress Notes (Signed)
Location:   [facility] Room Number: 145/D Place of Service:  SNF [phone](31) Provider:  Valerie RoysArlo Lassen  Margaret Simpson, MD  Patient Care Team: Kerri PerchesMargaret E Simpson, MD as PCP - General West BaliSandi L Fields, MD (Gastroenterology)  Extended Emergency Contact Information Primary Emergency Contact: [facility] Address: [address]          [name], [place] [id number] [place] Home Phone: [phone] Mobile Phone: [phone] Relation: Daughter Secondary Emergency Contact: Vivien PrestoMiller,Alfred  [place] Mobile Phone: [phone] Relation: Son  Code Status:  Full Code Goals of care: Advanced Directive information Advanced Directives 09/17/2016  Does Patient Have a Medical Advance Directive? Yes  Type of Advance Directive (No Data)  Does patient want to make changes to medical advance directive? No - Patient declined  Copy of Healthcare Power of Attorney in Chart? -  Would patient like information on creating a medical advance directive? No - Patient declined  Pre-existing out of facility DNR order (yellow form or pink MOST form) -     Chief Complaint  Patient presents with  . Acute Visit    Right Shoulder Pain    HPI:  Pt is a 82 y.o. female seen today for an acute visit for Complaints of right shoulder pain-she says she's had this for several days-she denies any trauma.  She previously is complaining of left shoulder discomfort x-rays did not show any acute process other than significant degenerative changes.  She does have an order for Norco as needed for pain but apparently is not asking for this much.  She does have a history of osteoporosis continues on Fosamax as well as calcium     Past Medical History:  Diagnosis Date  . Diverticulosis OCT 2010 TCS   SIGMOID COLON  . Esophageal web 2010   DILATION 16 MM  . Gastritis, Helicobacter pylori 2010   Rx. ABO for 10 days   . GERD (gastroesophageal reflux disease)     . Hip fracture, left (HCC)    2016  . Hyperlipidemia   . Hypertension   . Restless legs syndrome   . Seizures [facility](HCC) January 2010   Post head trauma in fall .sees Dr [place]n [place]   Past Surgical History:  Procedure Laterality Date  . ABDOMINAL HYSTERECTOMY    . COLONOSCOPY  OCT 2010   TORTUOUS, Sml IH, Tics, MULTIPLE SIMPLE ADENOMAS (<6MM)  . HIP ARTHROPLASTY Left 11/19/2014   Procedure: LEFT PARTIAL HIP REPLACEMENT;  Surgeon: Vickki HearingStanley E Harrison, MD;  Location: [facility];  Service: Orthopedics;  Laterality: Left;  . ORIF HIP FRACTURE  04/05/2012   Procedure: OPEN REDUCTION INTERNAL FIXATION HIP;  Surgeon: Vickki HearingStanley E Harrison, MD;  Location: [facility];  Service: Orthopedics;  Laterality: Right;  . UPPER GASTROINTESTINAL ENDOSCOPY  AUG 2010   SAVARY  . VESICOVAGINAL FISTULA CLOSURE W/ TAH  1966   bleeding     No Known Allergies  Current Outpatient Prescriptions on File Prior to Visit  Medication Sig Dispense Refill  . acetaminophen (TYLENOL) 325 MG tablet Take 2 tablets (650 mg total) by mouth every 6 (six) hours as needed for mild pain (or Fever >/= 101).    Marland Kitchen. alendronate (FOSAMAX) 70 MG tablet Take with full glass of water once a day on Monday    . aspirin 81 MG tablet Take 81 mg by mouth daily.    . calcium-vitamin D (OSCAL WITH D) 500-200 MG-UNIT TABS TAKE (1) TABLET BY MOUTH (3) TIMES DAILY WITH MEALS.  90 each 3  . ferrous sulfate 325 (65 FE) MG tablet Take 325 mg by mouth daily with breakfast. At least one before or after calcium    . FLUoxetine (PROZAC) 20 MG capsule TAKE 1 CAPSULE BY MOUTH ONCE A DAY. 30 capsule 5  . hydrochlorothiazide (HYDRODIURIL) 12.5 MG tablet Take 12.5 mg by mouth daily.    Marland Kitchen. HYDROcodone-acetaminophen (NORCO/VICODIN) 5-325 MG tablet Take one tablet by mouth every 6 hours as needed for moderate pain or two tablets by mouth every 6 hours as needed for severe pain. Max APAP 3gm/24 hours from all sources 240 tablet 0  . ipratropium-albuterol (DUONEB) 0.5-2.5 (3) MG/3ML  SOLN Give 1 vial inhalation twice a day for wheezing or SOB    . levETIRAcetam (KEPPRA) 500 MG tablet TAKE 1 TABLET BY MOUTH TWICE A DAY AS DIRECTED. 60 tablet 5  . lovastatin (MEVACOR) 20 MG tablet Take 1 tablet (20 mg total) by mouth at bedtime. 30 tablet 5  . Multiple Vitamin (MULTIVITAMIN) tablet Take 1 tablet by mouth daily.    . polyethylene glycol (MIRALAX / GLYCOLAX) packet Take 17 g by mouth daily. Hold for diarrhea    . ZINC OXIDE EX Apply topically. Apply zinc oxide every shift prn apply to left and right sides under the breast where the bra rest     No current facility-administered medications on file prior to visit.     Review of Systems  General no complaints of fever or chills.  Skin does not complain of rashes or itching.  Head ears eyes nose mouth and throat does not complaining currently of rhinorrhea or congestion apparently has complained of this in the past.  Respiratory is not complaining cough or shortness of breath today.  GI is not complaining of abdominal discomfort.  GU does not complaining of dysuria.  Musculoskeletal does complain of right shoulder discomfort-says at times it makes it difficult to use her right arm when she is trying to eat.  Neurologic does not complain of dizziness headache or numbness however.  Psych is not complaining currently of any overt depression or anxiety   Immunization History  Administered Date(s) Administered  . Influenza Split 07/25/2012  . Influenza,inj,Quad PF,36+ Mos 07/30/2013, 08/18/2014  . Influenza-Unspecified 07/06/2016  . PPD Test 12/06/2014  . Pneumococcal Conjugate-13 04/29/2014  . Pneumococcal Polysaccharide-23 07/25/2012  . Tdap 08/08/2011   Pertinent  Health Maintenance Due  Topic Date Due  . INFLUENZA VACCINE  Completed  . DEXA SCAN  Completed  . PNA vac Low Risk Adult  Completed   Fall Risk  04/29/2014 07/30/2013  Falls in the past year? Yes Yes  Number falls in past yr: 1 2 or more  Injury  with Fall? No -  Risk for fall due to : - Impaired balance/gait;Impaired mobility;History of fall(s)   Functional Status Survey:     She is afebrile pulse of 70 respirations 18 blood pressure 132/68 Physical Exam  Gen. this a pleasant LE female in no distress sitting comfortably in her wheelchair.  Her skin is warm and dry.  Eyes pupils appear reactive to light she has prescription lenses is well daily appears grossly intact.  Oropharynx clear mucous membranes moist.  Chest is clear to auscultation there is no labored breathing.  Heart is regular rate and rhythm with somewhat distant heart sounds she has trace to 1+ lower extremity edema she has TED hose applied.  Abdomen is obese soft nontender with positive bowel sounds.  Muscle skeletal does have baseline  lower extremity weakness upper extremity grip strength appears intact bilaterally she does have limited range of motion of the shoulders bilaterally there is some tenderness to palpation of her upper right shoulder area I did not note any erythema or deformity there is not tenderness to palpation of her neck area or arm area  She is able to flex and extend at the elbow with it appears baseline strength and range of motion  Neurologic is grossly intact her speech is clear touch sensation is intact.  Psych she does have normal mood and affect pleasant and appropriate.     Labs reviewed:  Recent Labs  03/07/16 0710 05/08/16 0731 08/03/16 0730  NA 141 144 138  K 4.0 4.0 3.8  CL 102 103 101  CO2 32 36* 32  GLUCOSE 87 92 95  BUN 19 25* 21*  CREATININE 0.71 0.74 0.81  CALCIUM 8.6* 8.8* 8.8*    Recent Labs  02/01/16 0750 05/08/16 0731 08/03/16 0730  AST 22 32 27  ALT 21 27 39  ALKPHOS 115 73 111  BILITOT 0.5 0.5 0.8  PROT 6.6 7.0 7.8  ALBUMIN 3.4* 3.4* 3.9    Recent Labs  10/08/15 0800 02/01/16 0750 03/07/16 0710 05/08/16 0731 08/03/16 0730  WBC 7.6 4.9 6.0 6.0 6.3  NEUTROABS 4.8 1.9  --  3.0  --     HGB 10.2* 12.3 13.1 12.2 13.5  HCT 31.2* 37.6 41.0 37.9 41.5  MCV 91.5 90.8 93.6 94.3 92.6  PLT 277 179 184 169 210   Lab Results  Component Value Date   TSH 1.110 04/13/2016   No results found for: HGBA1C Lab Results  Component Value Date   CHOL 129 02/01/2016   HDL 40 (L) 02/01/2016   LDLCALC 81 02/01/2016   TRIG 42 02/01/2016   CHOLHDL 3.2 02/01/2016    Significant Diagnostic Results in last 30 days:  No results found.  Assessment/Plan #1 right shoulder discomfort-will obtain an x-ray of the area-she does have Norco as needed for pain I did advise her if she's having significant discomfort to ask for this and I discussed this with nursing as well.  She does have history of osteoporosis continues on Fosamax as well as calcium  [id number].   London Sheer, CMA [phone]

## 2016-09-20 ENCOUNTER — Other Ambulatory Visit: Payer: Self-pay | Admitting: *Deleted

## 2016-09-20 MED ORDER — HYDROCODONE-ACETAMINOPHEN 5-325 MG PO TABS: ORAL_TABLET | ORAL | 0 refills | Status: DC

## 2016-09-20 NOTE — Telephone Encounter (Signed)
[facility] #[phone] Fax: [phone]   

## 2016-10-10 ENCOUNTER — Encounter: Payer: Self-pay | Admitting: Internal Medicine

## 2016-10-10 ENCOUNTER — Non-Acute Institutional Stay (SKILLED_NURSING_FACILITY): Payer: Medicare Other | Admitting: Internal Medicine

## 2016-10-10 DIAGNOSIS — L84 Corns and callosities: Secondary | ICD-10-CM

## 2016-10-10 NOTE — Progress Notes (Signed)
Location:   [facility] Room Number: 145/D Place of Service:  SNF [phone]) Provider:  Valerie Roys, MD  Patient Care Team: Kerri Perches, MD as PCP - General [place] Bali, MD (Gastroenterology)  Extended Emergency Contact Information Primary Emergency Contact: [facility] Address: [address]          [name], [place] [address] [place] Home Phone: [phone] Mobile Phone: [phone] Relation: Daughter Secondary Emergency Contact: Vivien Presto [place] Mobile Phone: [phone] Relation: Son  Code Status:  Full Code Goals of care: Advanced Directive information Advanced Directives 10/10/2016  Does Patient Have a Medical Advance Directive? Yes  Type of Advance Directive (No Data)  Does patient want to make changes to medical advance directive? No - Patient declined  Copy of Healthcare Power of Attorney in Chart? -  Would patient like information on creating a medical advance directive? No - Patient declined  Pre-existing out of facility DNR order (yellow form or pink MOST form) -     Chief Complaint  Patient presents with  . Acute Visit    right second toe  Discomfort  HPI:  Pt is a 83 y.o. female seen today for an acute visit for discomfort of her right second toe.  She states she feels the pain when she puts on shoes.  She denies any trauma to the area-no stubbing of her toe.  I did evaluate the toe and she does have a large callous on the top of the mid right toe-I do not see any erythema drainage or surrounding erythema I do not see a callus elsewhere on her foot     Past Medical History:  Diagnosis Date  . Diverticulosis OCT 2010 TCS   SIGMOID COLON  . Esophageal web 2010   DILATION 16 MM  . Gastritis, Helicobacter pylori 2010   Rx. ABO for 10 days   . GERD (gastroesophageal reflux disease)   . Hip fracture, left (HCC)    2016  . Hyperlipidemia   .  Hypertension   . Restless legs syndrome   . Seizures [facility] January 2010   Post head trauma in fall .sees Dr Gerilyn Pilgrim   Past Surgical History:  Procedure Laterality Date  . ABDOMINAL HYSTERECTOMY    . COLONOSCOPY  OCT 2010   TORTUOUS, Sml IH, Tics, MULTIPLE SIMPLE ADENOMAS (<6MM)  . HIP ARTHROPLASTY Left 11/19/2014   Procedure: LEFT PARTIAL HIP REPLACEMENT;  Surgeon: Vickki Hearing, MD;  Location: [facility];  Service: Orthopedics;  Laterality: Left;  . ORIF HIP FRACTURE  04/05/2012   Procedure: OPEN REDUCTION INTERNAL FIXATION HIP;  Surgeon: Vickki Hearing, MD;  Location: [facility];  Service: Orthopedics;  Laterality: Right;  . UPPER GASTROINTESTINAL ENDOSCOPY  AUG 2010   SAVARY  . VESICOVAGINAL FISTULA CLOSURE W/ TAH  1966   bleeding     No Known Allergies  Current Outpatient Prescriptions on File Prior to Visit  Medication Sig Dispense Refill  . acetaminophen (TYLENOL) 325 MG tablet Take 2 tablets (650 mg total) by mouth every 6 (six) hours as needed for mild pain (or Fever >/= 101).    Marland Kitchen alendronate (FOSAMAX) 70 MG tablet Take with full glass of water once a day on Monday    . aspirin 81 MG tablet Take 81 mg by mouth daily.    . calcium-vitamin D (OSCAL WITH D) 500-200 MG-UNIT TABS TAKE (1) TABLET BY MOUTH (3) TIMES DAILY WITH MEALS. 90 each  3  . docusate sodium (COLACE) 100 MG capsule Take 200 mg by mouth daily.    . ferrous sulfate 325 (65 FE) MG tablet Take 325 mg by mouth daily with breakfast. At least one before or after calcium    . FLUoxetine (PROZAC) 20 MG capsule TAKE 1 CAPSULE BY MOUTH ONCE A DAY. 30 capsule 5  . hydrochlorothiazide (HYDRODIURIL) 12.5 MG tablet Take 12.5 mg by mouth daily.    Marland Kitchen HYDROcodone-acetaminophen (NORCO/VICODIN) 5-325 MG tablet Take one tablet by mouth every 6 hours as needed for moderate pain or two tablets by mouth every 6 hours as needed for severe pain. Max APAP 3gm/24 hours from all sources 240 tablet 0  . ipratropium-albuterol (DUONEB)  0.5-2.5 (3) MG/3ML SOLN Give 1 vial inhalation twice a day for wheezing or SOB    . levETIRAcetam (KEPPRA) 500 MG tablet TAKE 1 TABLET BY MOUTH TWICE A DAY AS DIRECTED. 60 tablet 5  . lovastatin (MEVACOR) 20 MG tablet Take 1 tablet (20 mg total) by mouth at bedtime. 30 tablet 5  . Melatonin 5 MG TABS Take 5 mg by mouth at bedtime.    . Multiple Vitamin (MULTIVITAMIN) tablet Take 1 tablet by mouth daily.    . polyethylene glycol (MIRALAX / GLYCOLAX) packet Take 17 g by mouth daily. Hold for diarrhea    . tiotropium (SPIRIVA) 18 MCG inhalation capsule Give 2 puffs once a day    . ZINC OXIDE EX Apply topically. Apply zinc oxide every shift prn apply to left and right sides under the breast where the bra rest     No current facility-administered medications on file prior to visit.      Review of Systems   Notable for right great toe pain  Immunization History  Administered Date(s) Administered  . Influenza Split 07/25/2012  . Influenza,inj,Quad PF,36+ Mos 07/30/2013, 08/18/2014  . Influenza-Unspecified 07/06/2016  . PPD Test 12/06/2014  . Pneumococcal Conjugate-13 04/29/2014  . Pneumococcal Polysaccharide-23 07/25/2012  . Tdap 08/08/2011   Pertinent  Health Maintenance Due  Topic Date Due  . INFLUENZA VACCINE  Completed  . DEXA SCAN  Completed  . PNA vac Low Risk Adult  Completed   Fall Risk  04/29/2014 07/30/2013  Falls in the past year? Yes Yes  Number falls in past yr: 1 2 or more  Injury with Fall? No -  Risk for fall due to : - Impaired balance/gait;Impaired mobility;History of fall(s)   Functional Status Survey:    There were no vitals filed for this visit.    Physical Exam   Right great toe-as noted above she does have what appears to be a callous protruding on the top of her mid right2ndt toe--I do not see any drainage bleeding surrounding erythema or sign of cellulitis-her other toes do not appear to have any acute abnormality.    Labs reviewed:  Recent  Labs  03/07/16 0710 05/08/16 0731 08/03/16 0730  NA 141 144 138  K 4.0 4.0 3.8  CL 102 103 101  CO2 32 36* 32  GLUCOSE 87 92 95  BUN 19 25* 21*  CREATININE 0.71 0.74 0.81  CALCIUM 8.6* 8.8* 8.8*    Recent Labs  02/01/16 0750 05/08/16 0731 08/03/16 0730  AST 22 32 27  ALT 21 27 39  ALKPHOS 115 73 111  BILITOT 0.5 0.5 0.8  PROT 6.6 7.0 7.8  ALBUMIN 3.4* 3.4* 3.9    Recent Labs  02/01/16 0750 03/07/16 0710 05/08/16 0731 08/03/16 0730  WBC  4.9 6.0 6.0 6.3  NEUTROABS 1.9  --  3.0  --   HGB 12.3 13.1 12.2 13.5  HCT 37.6 41.0 37.9 41.5  MCV 90.8 93.6 94.3 92.6  PLT 179 184 169 210   Lab Results  Component Value Date   TSH 1.110 04/13/2016   No results found for: HGBA1C Lab Results  Component Value Date   CHOL 129 02/01/2016   HDL 40 (L) 02/01/2016   LDLCALC 81 02/01/2016   TRIG 42 02/01/2016   CHOLHDL 3.2 02/01/2016    Significant Diagnostic Results in last 30 days:  No results found.  Assessment/Plan #1-callous--right second toe-I did speak with nursing staff about obtaining a podiatry consult-also about any possible padding protection for the area until she is evaluated by podiatry and they will be following up on this  [id number].      London SheerLuster, Sally C, [place] [phone]

## 2016-10-16 ENCOUNTER — Encounter: Payer: Self-pay | Admitting: Internal Medicine

## 2016-10-16 ENCOUNTER — Non-Acute Institutional Stay (SKILLED_NURSING_FACILITY): Payer: Medicare Other | Admitting: Internal Medicine

## 2016-10-16 DIAGNOSIS — G40909 Epilepsy, unspecified, not intractable, without status epilepticus: Secondary | ICD-10-CM | POA: Diagnosis not present

## 2016-10-16 DIAGNOSIS — M810 Age-related osteoporosis without current pathological fracture: Secondary | ICD-10-CM | POA: Diagnosis not present

## 2016-10-16 DIAGNOSIS — M19011 Primary osteoarthritis, right shoulder: Secondary | ICD-10-CM | POA: Diagnosis not present

## 2016-10-16 DIAGNOSIS — I10 Essential (primary) hypertension: Secondary | ICD-10-CM

## 2016-10-16 DIAGNOSIS — F418 Other specified anxiety disorders: Secondary | ICD-10-CM

## 2016-10-16 DIAGNOSIS — M19012 Primary osteoarthritis, left shoulder: Secondary | ICD-10-CM

## 2016-10-16 NOTE — Progress Notes (Signed)
Location:   [facility] Room Number: 145/D Place of Service:  SNF (31) Provider:  Valerie RoysArlo,Dawana Asper  Margaret Simpson, MD  Patient Care Team: Kerri PerchesMargaret E Simpson, MD as PCP - General [place] BaliSandi L Fields, MD (Gastroenterology)  Extended Emergency Contact Information Primary Emergency Contact: [facility],Alfreda Address: [address]          [name], [place] [address] [place] Home Phone: [phone] Mobile Phone: [phone] Relation: Daughter Secondary Emergency Contact: Vivien PrestoMiller,Alfred  [place] Mobile Phone: [phone] Relation: Son  Code Status:  Full Code Goals of care: Advanced Directive information Advanced Directives 10/16/2016  Does Patient Have a Medical Advance Directive? Yes  Type of Advance Directive (No Data)  Does patient want to make changes to medical advance directive? No - Patient declined  Copy of Healthcare Power of Attorney in Chart? -  Would patient like information on creating a medical advance directive? No - Patient declined  Pre-existing out of facility DNR order (yellow form or pink MOST form) -     Chief Complaint  Patient presents with  . Medical Management of Chronic Issues    Routine Visit   Medical management of chronic medical conditions including hypertension-seizure disorder-anemia-chronic venous stasis-osteoarthritis.   HPI:  Pt is a 83 y.o. female seen today for medical management of chronic diseases.  As noted above.  She appears to be stable I did see her recently for a painful warm callus on her right second toe podiatry consult is pending she says it is less painful now it is being protected.  Also seen recently for complaints of right shoulder pain-x-ray showed degenerative changes.  She is now receiving hydrocodone when necessary and apparently this is helping she is also on Tylenol arthritis once a day as well as receiving Biofreeze.  Currently she is sitting in her  wheelchair comfortably and has no acute complaints.     Past Medical History:  Diagnosis Date  . Diverticulosis OCT 2010 TCS   SIGMOID COLON  . Esophageal web 2010   DILATION 16 MM  . Gastritis, Helicobacter pylori 2010   Rx. ABO for 10 days   . GERD (gastroesophageal reflux disease)   . Hip fracture, left (HCC)    2016  . Hyperlipidemia   . Hypertension   . Restless legs syndrome   . Seizures [facility](HCC) January 2010   Post head trauma in fall .sees Dr Gerilyn Pilgrimoonquah   Past Surgical History:  Procedure Laterality Date  . ABDOMINAL HYSTERECTOMY    . COLONOSCOPY  OCT 2010   TORTUOUS, Sml IH, Tics, MULTIPLE SIMPLE ADENOMAS (<6MM)  . HIP ARTHROPLASTY Left 11/19/2014   Procedure: LEFT PARTIAL HIP REPLACEMENT;  Surgeon: Vickki HearingStanley E Harrison, MD;  Location: [facility];  Service: Orthopedics;  Laterality: Left;  . ORIF HIP FRACTURE  04/05/2012   Procedure: OPEN REDUCTION INTERNAL FIXATION HIP;  Surgeon: Vickki HearingStanley E Harrison, MD;  Location: [facility];  Service: Orthopedics;  Laterality: Right;  . UPPER GASTROINTESTINAL ENDOSCOPY  AUG 2010   SAVARY  . VESICOVAGINAL FISTULA CLOSURE W/ TAH  1966   bleeding     No Known Allergies  Current Outpatient Prescriptions on File Prior to Visit  Medication Sig Dispense Refill  . acetaminophen (TYLENOL) 325 MG tablet Take 2 tablets (650 mg total) by mouth every 6 (six) hours as needed for mild pain (or Fever >/= 101).    Marland Kitchen. alendronate (FOSAMAX) 70 MG tablet Take with full glass of water once a day on Monday    .  aspirin 81 MG tablet Take 81 mg by mouth daily.    . calcium-vitamin D (OSCAL WITH D) 500-200 MG-UNIT TABS TAKE (1) TABLET BY MOUTH (3) TIMES DAILY WITH MEALS. 90 each 3  . docusate sodium (COLACE) 100 MG capsule Take 200 mg by mouth daily.    . ferrous sulfate 325 (65 FE) MG tablet Take 325 mg by mouth daily with breakfast. At least one before or after calcium    . FLUoxetine (PROZAC) 20 MG capsule TAKE 1 CAPSULE BY MOUTH ONCE A DAY. 30 capsule 5  .  hydrochlorothiazide (HYDRODIURIL) 12.5 MG tablet Take 12.5 mg by mouth daily.    Marland Kitchen HYDROcodone-acetaminophen (NORCO/VICODIN) 5-325 MG tablet Take one tablet by mouth every 6 hours as needed for moderate pain or two tablets by mouth every 6 hours as needed for severe pain. Max APAP 3gm/24 hours from all sources 240 tablet 0  . ipratropium-albuterol (DUONEB) 0.5-2.5 (3) MG/3ML SOLN Give 1 vial inhalation twice a day for wheezing or SOB    . levETIRAcetam (KEPPRA) 500 MG tablet TAKE 1 TABLET BY MOUTH TWICE A DAY AS DIRECTED. 60 tablet 5  . lovastatin (MEVACOR) 20 MG tablet Take 1 tablet (20 mg total) by mouth at bedtime. 30 tablet 5  . Melatonin 5 MG TABS Take 5 mg by mouth at bedtime.    . Menthol, Topical Analgesic, (BIOFREEZE) 4 % GEL Apply to right shoulder twice a day    . Multiple Vitamin (MULTIVITAMIN) tablet Take 1 tablet by mouth daily.    . polyethylene glycol (MIRALAX / GLYCOLAX) packet Take 17 g by mouth daily. Hold for diarrhea    . tiotropium (SPIRIVA) 18 MCG inhalation capsule Give 2 puffs once a day    . ZINC OXIDE EX Apply topically. Apply zinc oxide every shift prn apply to left and right sides under the breast where the bra rest     No current facility-administered medications on file prior to visit.     Review of Systems   In general does not complain of any fever or chills.  Skin does not complain of rashes or itching.  Head ears eyes nose mouth and throat no complaints of visual changes sore throat.  Respiratory is not complaining\of any shortness of breath or cough  Cardiac no chest pain again has had some increased edema at times right lower leg. venous Dopplers have been negative for any DVT  GI not complaining of any nausea vomiting diarrhea or constipation.  GU does not complain of dysuria.  Muscle skeletal is not really complaining of any joint pain or leg pain.  when necessary hydrocodone appears to be helping.  Does not really complain of right  second toe pain this afternoon  Neurologic does not complain of dizziness headache or numbness.  Psych-history depression that appears to be well controlled she is on Prozac    Immunization History  Administered Date(s) Administered  . Influenza Split 07/25/2012  . Influenza,inj,Quad PF,36+ Mos 07/30/2013, 08/18/2014  . Influenza-Unspecified 07/06/2016  . PPD Test 12/06/2014  . Pneumococcal Conjugate-13 04/29/2014  . Pneumococcal Polysaccharide-23 07/25/2012  . Tdap 08/08/2011   Pertinent  Health Maintenance Due  Topic Date Due  . INFLUENZA VACCINE  Completed  . DEXA SCAN  Completed  . PNA vac Low Risk Adult  Completed   Fall Risk  04/29/2014 07/30/2013  Falls in the past year? Yes Yes  Number falls in past yr: 1 2 or more  Injury with Fall? No -  Risk for  fall due to : - Impaired balance/gait;Impaired mobility;History of fall(s)   Functional Status Survey:    Vitals:   10/16/16 1346  BP: (!) 128/58  Pulse: 74  Resp: 20  Temp: 98 F (36.7 C)  TempSrc: Oral  Weight is 259 pounds this is up about 4 pounds since October  Physical Exam   In general is a pleasantly female in no distress sitting comfortably in her wheelchair.  Her skin is warm and dry.  Eyes she has prescription lenses  Visual acuity appears grossly intact.  . Oropharynx is clear mucous membranes moist  Chest is clear to auscultation there is no labored breathing.--Somewhat shallow air entry  Heart is regular rate and rhythm without murmur gallop or rub-continues with venous stasis changes With baseline edema of her lower extremities right greater than left--she has compression hose on bilaterally   Abdomen is obese soft nontender positive bowel sounds.  Muscle skeletal ambulates in a wheelchair moves all her extremities 4 she has good strength bilaterally other than arthritic changes I do not see any abnormalities.  Currently she has wrapping around her right second toe where  the corned callous was  Neurologic is grossly intact no lateralizing findings her speech is clear.  Psych she continues to be pleasant and appropriate smiling-has some mild cognitive deficits but is doing quite well with supportive care  Labs reviewed:  Labs reviewed:  Recent Labs  03/07/16 0710 05/08/16 0731 08/03/16 0730  NA 141 144 138  K 4.0 4.0 3.8  CL 102 103 101  CO2 32 36* 32  GLUCOSE 87 92 95  BUN 19 25* 21*  CREATININE 0.71 0.74 0.81  CALCIUM 8.6* 8.8* 8.8*    Recent Labs  02/01/16 0750 05/08/16 0731 08/03/16 0730  AST 22 32 27  ALT 21 27 39  ALKPHOS 115 73 111  BILITOT 0.5 0.5 0.8  PROT 6.6 7.0 7.8  ALBUMIN 3.4* 3.4* 3.9    Recent Labs  02/01/16 0750 03/07/16 0710 05/08/16 0731 08/03/16 0730  WBC 4.9 6.0 6.0 6.3  NEUTROABS 1.9  --  3.0  --   HGB 12.3 13.1 12.2 13.5  HCT 37.6 41.0 37.9 41.5  MCV 90.8 93.6 94.3 92.6  PLT 179 184 169 210   Lab Results  Component Value Date   TSH 1.110 04/13/2016   No results found for: HGBA1C Lab Results  Component Value Date   CHOL 129 02/01/2016   HDL 40 (L) 02/01/2016   LDLCALC 81 02/01/2016   TRIG 42 02/01/2016   CHOLHDL 3.2 02/01/2016    Significant Diagnostic Results in last 30 days:  No results found.  Assessment/Plan   #1 anemia-this appears stable with a hemoglobin of 13.5 on lab done October 2017 she is on iron Will update this.  #2 history of osteoporosis with history of left hip fracture in the past this appears stable she is on Fosamax as well as calcium.  #3 history of osteoarthritic pain especially nurse shoulders appears that when necessary hydrocodone is helping-she also is on Tylenol arthritis once a day routinely also receives topical bio freeze.  #4 history of hypertension this appears stable on hydrochlorothiazide most recent blood pressure--128/58.--We will check a BMP  #5 seizure disorder this is been stable for an extended period of time she is on Keppra.  #6 at times  history of chest congestion cough she is on duo nebs as needed continues on Spiriva as well as Spiriva appears to be helping.  #7 history depression  she is on Prozac this appears to be stable.  Number 8   history of insomnia she's been started on melatonin apparently this is helping as well.  #8 history of callous right second toe podiatry consult is pending area is currently wrapped this appears to be helping with any discomfort  #9 history of hyperlipidemia lipid panel in April 2017 showed LDL 81 HDL 40-she is on lovastatin recommendation previously to check this yearly.  #10 history of constipation she is on MiraLAX apparently this is not an issue currently.  #11 history of chronic edema this appears relatively baseline on exam today Dopplers have been negative in the past for any DVT in the right lower extremity which is chronically somewhat larger than the left leg-we will continue to monitor.  [id number]

## 2016-10-17 ENCOUNTER — Encounter (HOSPITAL_COMMUNITY)
Admission: AD | Admit: 2016-10-17 | Discharge: 2016-10-17 | Disposition: A | Discharge status: Home / Self Care | Payer: Medicare Other | Source: Skilled Nursing Facility | Attending: Internal Medicine | Admitting: Internal Medicine

## 2016-10-17 DIAGNOSIS — I10 Essential (primary) hypertension: Secondary | ICD-10-CM | POA: Diagnosis present

## 2016-10-17 DIAGNOSIS — M1990 Unspecified osteoarthritis, unspecified site: Secondary | ICD-10-CM | POA: Diagnosis present

## 2016-10-17 DIAGNOSIS — F3289 Other specified depressive episodes: Secondary | ICD-10-CM | POA: Diagnosis present

## 2016-10-17 DIAGNOSIS — E785 Hyperlipidemia, unspecified: Secondary | ICD-10-CM | POA: Insufficient documentation

## 2016-10-17 DIAGNOSIS — K219 Gastro-esophageal reflux disease without esophagitis: Secondary | ICD-10-CM | POA: Insufficient documentation

## 2016-10-17 LAB — CBC WITH DIFFERENTIAL/PLATELET
Basophils Absolute: 0 10*3/uL (ref 0.0–0.1)
Basophils Relative: 0 %
Eosinophils Absolute: 0.1 10*3/uL (ref 0.0–0.7)
Eosinophils Relative: 3 %
HEMATOCRIT: 38.2 % (ref 36.0–46.0)
Hemoglobin: 12.7 g/dL (ref 12.0–15.0)
Lymphocytes Relative: 43 %
Lymphs Abs: 2 10*3/uL (ref 0.7–4.0)
MCH: 30.7 pg (ref 26.0–34.0)
MCHC: 33.2 g/dL (ref 30.0–36.0)
MCV: 92.3 fL (ref 78.0–100.0)
Monocytes Absolute: 0.5 10*3/uL (ref 0.1–1.0)
Monocytes Relative: 10 %
Neutro Abs: 2.1 10*3/uL (ref 1.7–7.7)
Neutrophils Relative %: 44 %
Platelets: 163 10*3/uL (ref 150–400)
RBC: 4.14 MIL/uL (ref 3.87–5.11)
RDW: 14.5 % (ref 11.5–15.5)
WBC: 4.7 10*3/uL (ref 4.0–10.5)

## 2016-10-17 LAB — BASIC METABOLIC PANEL
ANION GAP: 7 (ref 5–15)
BUN: 19 mg/dL (ref 6–20)
CALCIUM: 8.5 mg/dL — AB (ref 8.9–10.3)
CO2: 32 mmol/L (ref 22–32)
Chloride: 101 mmol/L (ref 101–111)
Creatinine, Ser: 0.8 mg/dL (ref 0.44–1.00)
GFR calc Af Amer: >60 mL/min (ref >60)
GFR calc non Af Amer: >60 mL/min (ref >60)
Glucose, Bld: 95 mg/dL (ref 65–99)
POTASSIUM: 3.8 mmol/L (ref 3.5–5.1)
Sodium: 140 mmol/L (ref 135–145)

## 2016-12-11 ENCOUNTER — Encounter: Payer: Self-pay | Admitting: Sports Medicine

## 2016-12-11 ENCOUNTER — Ambulatory Visit (INDEPENDENT_AMBULATORY_CARE_PROVIDER_SITE_OTHER): Payer: Medicare Other | Admitting: Sports Medicine

## 2016-12-11 VITALS — BP 119/67 | HR 82

## 2016-12-11 DIAGNOSIS — M79674 Pain in right toe(s): Secondary | ICD-10-CM

## 2016-12-11 DIAGNOSIS — M2040 Other hammer toe(s) (acquired), unspecified foot: Secondary | ICD-10-CM

## 2016-12-11 DIAGNOSIS — L84 Corns and callosities: Secondary | ICD-10-CM | POA: Diagnosis not present

## 2016-12-11 NOTE — Progress Notes (Signed)
Subjective: Alejandra Cruz is a 83 y.o. female patient who presents to office for evaluation of Right foot pain secondary to callus skin. Patient complains of pain at the lesion present Right foot at the 2nd toe and at heel with history of decubitus ulceration. Patient has tried nothing [facility] Patient denies any other pedal complaints.   Patient Active Problem List   Diagnosis Date Noted  . Osteoporosis, senile 04/12/2016  . Malaise 10/07/2015  . Leg edema, right 09/01/2015  . Osteoarthrosis involving lower leg 06/21/2015  . Bradycardia 04/10/2015  . Iron deficiency anemia 11/30/2014  . Femoral neck fracture (HCC) 11/19/2014  . At high risk for falls 11/12/2014  . Need for prophylactic vaccination and inoculation against influenza 08/21/2014  . Reduced vision 04/29/2014  . Medicare annual wellness visit, subsequent 04/29/2014  . Need for pneumococcal vaccine 04/29/2014  . Pain in joint, shoulder region 07/30/2013  . Recurrent falls 03/15/2013  . DVT of popliteal vein (HCC) 10/05/2012  . Head trauma 10/03/2012  . Closed intertrochanteric fracture of right hip (HCC) 07/02/2012  . Carotid bruit 11/28/2011  . Dysphagia 01/31/2011  . Personal history of colonic polyps 01/31/2011  . Obsessive-compulsive disorder 08/03/2010  . Seizure disorder (HCC) 06/11/2009  . Hyperlipemia 06/09/2009  . Depression with anxiety 06/09/2009  . Essential hypertension 06/09/2009    Current Outpatient Prescriptions on File Prior to Visit  Medication Sig Dispense Refill  . acetaminophen (TYLENOL) 325 MG tablet Take 2 tablets (650 mg total) by mouth every 6 (six) hours as needed for mild pain (or Fever >/= 101).    Marland Kitchen. alendronate (FOSAMAX) 70 MG tablet Take with full glass of water once a day on Monday    . aspirin 81 MG tablet Take 81 mg by mouth daily.    . calcium-vitamin D (OSCAL WITH D) 500-200 MG-UNIT TABS TAKE (1) TABLET BY MOUTH (3) TIMES DAILY WITH MEALS. 90 each 3  .  docusate sodium (COLACE) 100 MG capsule Take 200 mg by mouth daily.    . ferrous sulfate 325 (65 FE) MG tablet Take 325 mg by mouth daily with breakfast. At least one before or after calcium    . FLUoxetine (PROZAC) 20 MG capsule TAKE 1 CAPSULE BY MOUTH ONCE A DAY. 30 capsule 5  . hydrochlorothiazide (HYDRODIURIL) 12.5 MG tablet Take 12.5 mg by mouth daily.    Marland Kitchen. HYDROcodone-acetaminophen (NORCO/VICODIN) 5-325 MG tablet Take one tablet by mouth every 6 hours as needed for moderate pain or two tablets by mouth every 6 hours as needed for severe pain. Max APAP 3gm/24 hours from all sources 240 tablet 0  . ipratropium-albuterol (DUONEB) 0.5-2.5 (3) MG/3ML SOLN Give 1 vial inhalation twice a day for wheezing or SOB    . levETIRAcetam (KEPPRA) 500 MG tablet TAKE 1 TABLET BY MOUTH TWICE A DAY AS DIRECTED. 60 tablet 5  . lovastatin (MEVACOR) 20 MG tablet Take 1 tablet (20 mg total) by mouth at bedtime. 30 tablet 5  . Melatonin 5 MG TABS Take 5 mg by mouth at bedtime.    . Menthol, Topical Analgesic, (BIOFREEZE) 4 % GEL Apply to right shoulder twice a day    . Multiple Vitamin (MULTIVITAMIN) tablet Take 1 tablet by mouth daily.    . polyethylene glycol (MIRALAX / GLYCOLAX) packet Take 17 g by mouth daily. Hold for diarrhea    . tiotropium (SPIRIVA) 18 MCG inhalation capsule Give 2 puffs once a day    . ZINC OXIDE EX  Apply topically. Apply zinc oxide every shift prn apply to left and right sides under the breast where the bra rest     No current facility-administered medications on file prior to visit.     No Known Allergies  Objective:  General: Alert and oriented x3 in no acute distress  Dermatology: Keratotic lesion present 2nd PIPJ dorsal with skin lines transversing the lesion, pain is present with direct pressure to the lesion with a central nucleated core noted,  Pre-ulcerative callus right posterior heel with no signs of infection, no webspace macerations, no ecchymosis bilateral, all nails x 10  are mildly thick and elongated.   Vascular: Dorsalis Pedis and Posterior Tibial pedal pulses 1/4, Capillary Fill Time 3 seconds, + scant pedal hair growth bilateral, no edema bilateral lower extremities, Temperature gradient within normal limits.  Neurology: Gross sensation intact via light touch bilateral.  Musculoskeletal: Mild tenderness with palpation at the keratotic lesion site on Right, Bunion and hammertoes, Muscular strength 4/5 in all groups without pain on range of motion.   Assessment and Plan: Problem List Items Addressed This Visit    None    Visit Diagnoses    Callus of foot    -  Primary   Callus of heel       Pre-ulcerative calluses       Toe pain, right       Hammer toe, unspecified laterality          -Complete examination performed -Discussed treatment options -Parred keratoic lesion using a chisel blade at right 2nd toe; gave toe protector -Encouraged daily skin emollients; Recommend Amlactin -Advised heel offloading to prevent re-ulceration right heel -Patient to return to office as needed or sooner if condition worsens.  Asencion Islam, DPM

## 2016-12-31 ENCOUNTER — Encounter: Payer: Self-pay | Admitting: Internal Medicine

## 2016-12-31 ENCOUNTER — Non-Acute Institutional Stay (SKILLED_NURSING_FACILITY): Payer: Medicare Other | Admitting: Internal Medicine

## 2016-12-31 DIAGNOSIS — I10 Essential (primary) hypertension: Secondary | ICD-10-CM

## 2016-12-31 DIAGNOSIS — M810 Age-related osteoporosis without current pathological fracture: Secondary | ICD-10-CM | POA: Diagnosis not present

## 2016-12-31 DIAGNOSIS — F418 Other specified anxiety disorders: Secondary | ICD-10-CM

## 2016-12-31 DIAGNOSIS — E785 Hyperlipidemia, unspecified: Secondary | ICD-10-CM | POA: Diagnosis not present

## 2016-12-31 NOTE — Progress Notes (Signed)
Location:   [facility] Room Number: 145/D Place of Service:  SNF [phone]) Provider:  Reymundo Poll, MD  Patient Care Team: Kerri Perches, MD as PCP - General West Bali, MD (Gastroenterology)  Extended Emergency Contact Information Primary Emergency Contact: [facility] Address: [address]          [name], [place] [address] [place] Home Phone: [phone] Mobile Phone: [phone] Relation: Daughter Secondary Emergency Contact: Vivien Presto [place] of [place] Mobile Phone: [phone] Relation: Son  Code Status:  Full Code Goals of care: Advanced Directive information Advanced Directives 12/31/2016  Does Patient Have a Medical Advance Directive? Yes  Type of Advance Directive (No Data)  Does patient want to make changes to medical advance directive? No - Patient declined  Copy of Healthcare Power of Attorney in Chart? -  Would patient like information on creating a medical advance directive? No - Patient declined  Pre-existing out of facility DNR order (yellow form or pink MOST form) -     Chief Complaint  Patient presents with  . Medical Management of Chronic Issues    Routine Visit    HPI:  Pt is a 83 y.o. female seen today for medical management of chronic diseases.  Patient has h/o Hypertension, Seizure disorder, Anemia, Chronic Venous stasis, and Arthritis, Chronic Bronchitis, Osteoporosis s/p Femoral neck fracture And depression  Patient is doing well. She did see Podiatrist for lesion on right foot. It was debrided and Daily Emollients were recommended. She use to have cough and Wheezing in the morning which has responded to Spiriva and she says she is not having cough anymore. Her insomnia is better too.And appetite is good. Weight is stable at 259 lbs. Her Prozac was recently increased to 30 mg. Her mood seems stable.    Past Medical History:  Diagnosis Date  .  Diverticulosis OCT 2010 TCS   SIGMOID COLON  . Esophageal web 2010   DILATION 16 MM  . Gastritis, Helicobacter pylori 2010   Rx. ABO for 10 days   . GERD (gastroesophageal reflux disease)   . Hip fracture, left (HCC)    2016  . Hyperlipidemia   . Hypertension   . Restless legs syndrome   . Seizures [facility]) January 2010   Post head trauma in fall .sees Dr Gerilyn Pilgrim   Past Surgical History:  Procedure Laterality Date  . ABDOMINAL HYSTERECTOMY    . COLONOSCOPY  OCT 2010   TORTUOUS, Sml IH, Tics, MULTIPLE SIMPLE ADENOMAS (<6MM)  . HIP ARTHROPLASTY Left 11/19/2014   Procedure: LEFT PARTIAL HIP REPLACEMENT;  Surgeon: Vickki Hearing, MD;  Location: [facility];  Service: Orthopedics;  Laterality: Left;  . ORIF HIP FRACTURE  04/05/2012   Procedure: OPEN REDUCTION INTERNAL FIXATION HIP;  Surgeon: Vickki Hearing, MD;  Location: [facility];  Service: Orthopedics;  Laterality: Right;  . UPPER GASTROINTESTINAL ENDOSCOPY  AUG 2010   SAVARY  . VESICOVAGINAL FISTULA CLOSURE W/ TAH  1966   bleeding     No Known Allergies  Current Outpatient Prescriptions on File Prior to Visit  Medication Sig Dispense Refill  . acetaminophen (TYLENOL) 325 MG tablet Take 2 tablets (650 mg total) by mouth every 6 (six) hours as needed for mild pain (or Fever >/= 101).    Marland Kitchen alendronate (FOSAMAX) 70 MG tablet Take with full glass of water once a day on Monday    . aspirin 81 MG tablet Take 81 mg by mouth  daily.    . calcium-vitamin D (OSCAL WITH D) 500-200 MG-UNIT TABS TAKE (1) TABLET BY MOUTH (3) TIMES DAILY WITH MEALS. 90 each 3  . docusate sodium (COLACE) 100 MG capsule Take 200 mg by mouth daily.    . ferrous sulfate 325 (65 FE) MG tablet Take 325 mg by mouth daily with breakfast. At least one before or after calcium    . FLUoxetine (PROZAC) 20 MG capsule TAKE 1 CAPSULE BY MOUTH ONCE A DAY. 30 capsule 5  . hydrochlorothiazide (HYDRODIURIL) 12.5 MG tablet Take 12.5 mg by mouth daily.    Marland Kitchen HYDROcodone-acetaminophen  (NORCO/VICODIN) 5-325 MG tablet Take one tablet by mouth every 6 hours as needed for moderate pain or two tablets by mouth every 6 hours as needed for severe pain. Max APAP 3gm/24 hours from all sources 240 tablet 0  . ipratropium-albuterol (DUONEB) 0.5-2.5 (3) MG/3ML SOLN Give 1 vial inhalation twice a day for wheezing or SOB    . levETIRAcetam (KEPPRA) 500 MG tablet TAKE 1 TABLET BY MOUTH TWICE A DAY AS DIRECTED. 60 tablet 5  . lovastatin (MEVACOR) 20 MG tablet Take 1 tablet (20 mg total) by mouth at bedtime. 30 tablet 5  . Melatonin 5 MG TABS Take 5 mg by mouth at bedtime.    . Menthol, Topical Analgesic, (BIOFREEZE) 4 % GEL Apply to right shoulder twice a day    . Multiple Vitamin (MULTIVITAMIN) tablet Take 1 tablet by mouth daily.    . polyethylene glycol (MIRALAX / GLYCOLAX) packet Take 17 g by mouth daily. Hold for diarrhea    . tiotropium (SPIRIVA) 18 MCG inhalation capsule Give 2 puffs once a day    . ZINC OXIDE EX Apply topically. Apply zinc oxide every shift prn apply to left and right sides under the breast where the bra rest     No current facility-administered medications on file prior to visit.      Review of Systems  Review of Systems  Constitutional: Negative for activity change, appetite change, chills, diaphoresis, fatigue and fever.  HENT: Negative for mouth sores, postnasal drip, rhinorrhea, sinus pain and sore throat.   Respiratory: Negative for apnea, cough, chest tightness, shortness of breath and wheezing.   Cardiovascular: Negative for chest pain, palpitations  Gastrointestinal: Negative for abdominal distention, abdominal pain, constipation, diarrhea, nausea and vomiting.  Genitourinary: Negative for dysuria and frequency.  Musculoskeletal: Negative for arthralgias, joint swelling and myalgias.  Skin: Negative for rash.  Neurological: Negative for dizziness, syncope, weakness, light-headedness and numbness.  Psychiatric/Behavioral: Negative for behavioral  problems, confusion and sleep disturbance.     Immunization History  Administered Date(s) Administered  . Influenza Split 07/25/2012  . Influenza,inj,Quad PF,36+ Mos 07/30/2013, 08/18/2014  . Influenza-Unspecified 07/06/2016  . PPD Test 12/06/2014  . Pneumococcal Conjugate-13 04/29/2014  . Pneumococcal Polysaccharide-23 07/25/2012  . Tdap 08/08/2011   Pertinent  Health Maintenance Due  Topic Date Due  . INFLUENZA VACCINE  Completed  . DEXA SCAN  Completed  . PNA vac Low Risk Adult  Completed   Fall Risk  04/29/2014 07/30/2013  Falls in the past year? Yes Yes  Number falls in past yr: 1 2 or more  Injury with Fall? No -  Risk for fall due to : - Impaired balance/gait;Impaired mobility;History of fall(s)   Functional Status Survey:    Vitals:   12/30/16 1458  BP: 134/84  Pulse: 79  Resp: 20  Temp: 97.1 F (36.2 C)  TempSrc: Oral  SpO2: 98%  Weight:  258 lb 12.8 oz (117.4 kg)  Height: 5\' 7"  (1.702 m)   Body mass index is 40.53 kg/m. Physical Exam  Constitutional: She appears well-developed and well-nourished.  HENT:  Head: Normocephalic.  Mouth/Throat: Oropharynx is clear and moist.  Eyes: Pupils are equal, round, and reactive to light.  Neck: Neck supple.  Cardiovascular: Normal rate and regular rhythm.   No murmur heard. Pulmonary/Chest: Effort normal and breath sounds normal. No respiratory distress. She has no wheezes. She has no rales. She exhibits no tenderness.  Abdominal: Soft. Bowel sounds are normal. She exhibits no distension. There is no tenderness. There is no rebound.  Musculoskeletal:  Has Moderate edema b/l  Neurological: She is alert.  Skin: Skin is warm and dry.  Has coin like lesion on Right Foot. With Dry Base and thick Skin.  Psychiatric: She has a normal mood and affect. Her behavior is normal. Thought content normal.    Labs reviewed:  Recent Labs  05/08/16 0731 08/03/16 0730 10/17/16 0500  NA 144 138 140  K 4.0 3.8 3.8  CL 103  101 101  CO2 36* 32 32  GLUCOSE 92 95 95  BUN 25* 21* 19  CREATININE 0.74 0.81 0.80  CALCIUM 8.8* 8.8* 8.5*    Recent Labs  02/01/16 0750 05/08/16 0731 08/03/16 0730  AST 22 32 27  ALT 21 27 39  ALKPHOS 115 73 111  BILITOT 0.5 0.5 0.8  PROT 6.6 7.0 7.8  ALBUMIN 3.4* 3.4* 3.9    Recent Labs  02/01/16 0750  05/08/16 0731 08/03/16 0730 10/17/16 0500  WBC 4.9  < > 6.0 6.3 4.7  NEUTROABS 1.9  --  3.0  --  2.1  HGB 12.3  < > 12.2 13.5 12.7  HCT 37.6  < > 37.9 41.5 38.2  MCV 90.8  < > 94.3 92.6 92.3  PLT 179  < > 169 210 163  < > = values in this interval not displayed. Lab Results  Component Value Date   TSH 1.110 04/13/2016   No results found for: HGBA1C Lab Results  Component Value Date   CHOL 129 02/01/2016   HDL 40 (L) 02/01/2016   LDLCALC 81 02/01/2016   TRIG 42 02/01/2016   CHOLHDL 3.2 02/01/2016    Significant Diagnostic Results in last 30 days:  No results found.  Assessment/Plan  Essential hypertension BP has been well controlled on HCTZ and Lovastatin Continue same dose. No change.  Osteoporosis, senile Patient os on Chronic Fosamax therapy. . It seems more likely since 2014. Serum Calcium Normal. Will repeat DEXA scan to follow treatment.  Bilateral lower extremity edema Edema is in Both legs more likely Chronic Venous stasis. Right is more then left probably due to her h/o right hip fracture. Stable  At high risk for falls Patient well aware to call for help when she needs to get up from her wheel chair.  Depression with anxiety Stable on Prozac recently to 30 mg.  Hyperlipemia Last LDL was 81 in 04/17 Continue lovastatin. Will repeat fasting lipid profile  Seizure disorder  Stable om Levitiracetam  Right foot Lesion D/W nurse and it seems to be getting better.  Chronic Bronchitis Better on Spiriva  Insomnia Continue Melatonin.   Family/ staff Communication:   Labs/tests ordered:  CBC, BMP, DEXA scan, TSH, Fasting  Lipid

## 2017-01-04 ENCOUNTER — Encounter (HOSPITAL_COMMUNITY)
Admission: RE | Admit: 2017-01-04 | Discharge: 2017-01-04 | Disposition: A | Discharge status: Home / Self Care | Payer: Medicare Other | Source: Skilled Nursing Facility | Attending: Internal Medicine | Admitting: Internal Medicine

## 2017-01-04 DIAGNOSIS — K219 Gastro-esophageal reflux disease without esophagitis: Secondary | ICD-10-CM | POA: Diagnosis present

## 2017-01-04 DIAGNOSIS — F3289 Other specified depressive episodes: Secondary | ICD-10-CM | POA: Diagnosis present

## 2017-01-04 DIAGNOSIS — J42 Unspecified chronic bronchitis: Secondary | ICD-10-CM | POA: Diagnosis present

## 2017-01-04 DIAGNOSIS — M6281 Muscle weakness (generalized): Secondary | ICD-10-CM | POA: Insufficient documentation

## 2017-01-04 DIAGNOSIS — R279 Unspecified lack of coordination: Secondary | ICD-10-CM | POA: Diagnosis not present

## 2017-01-04 DIAGNOSIS — F429 Obsessive-compulsive disorder, unspecified: Secondary | ICD-10-CM | POA: Diagnosis present

## 2017-01-04 LAB — LIPID PANEL
Cholesterol: 117 mg/dL (ref 0–200)
HDL: 39 mg/dL — ABNORMAL LOW (ref >40)
LDL Cholesterol: 62 mg/dL (ref 0–99)
Total CHOL/HDL Ratio: 3 RATIO
Triglycerides: 80 mg/dL (ref <150)
VLDL: 16 mg/dL (ref 0–40)

## 2017-01-04 LAB — BASIC METABOLIC PANEL
Anion gap: 7 (ref 5–15)
BUN: 25 mg/dL — ABNORMAL HIGH (ref 6–20)
CO2: 33 mmol/L — ABNORMAL HIGH (ref 22–32)
Calcium: 8.6 mg/dL — ABNORMAL LOW (ref 8.9–10.3)
Chloride: 102 mmol/L (ref 101–111)
Creatinine, Ser: 0.73 mg/dL (ref 0.44–1.00)
GFR calc Af Amer: >60 mL/min (ref >60)
GFR calc non Af Amer: >60 mL/min (ref >60)
Glucose, Bld: 80 mg/dL (ref 65–99)
Potassium: 3.4 mmol/L — ABNORMAL LOW (ref 3.5–5.1)
Sodium: 142 mmol/L (ref 135–145)

## 2017-01-04 LAB — CBC
HCT: 37.9 % (ref 36.0–46.0)
Hemoglobin: 12.5 g/dL (ref 12.0–15.0)
MCH: 30.7 pg (ref 26.0–34.0)
MCHC: 33 g/dL (ref 30.0–36.0)
MCV: 93.1 fL (ref 78.0–100.0)
Platelets: 136 10*3/uL — ABNORMAL LOW (ref 150–400)
RBC: 4.07 MIL/uL (ref 3.87–5.11)
RDW: 14.9 % (ref 11.5–15.5)
WBC: 5.4 10*3/uL (ref 4.0–10.5)

## 2017-01-04 LAB — TSH: TSH: 1.279 u[IU]/mL (ref 0.350–4.500)

## 2017-01-07 ENCOUNTER — Encounter (HOSPITAL_COMMUNITY)
Admission: RE | Admit: 2017-01-07 | Discharge: 2017-01-07 | Disposition: A | Discharge status: Home / Self Care | Payer: Medicare Other | Source: Skilled Nursing Facility | Attending: Internal Medicine | Admitting: Internal Medicine

## 2017-01-07 DIAGNOSIS — M6281 Muscle weakness (generalized): Secondary | ICD-10-CM | POA: Insufficient documentation

## 2017-01-07 DIAGNOSIS — F3289 Other specified depressive episodes: Secondary | ICD-10-CM | POA: Diagnosis present

## 2017-01-07 DIAGNOSIS — K219 Gastro-esophageal reflux disease without esophagitis: Secondary | ICD-10-CM | POA: Diagnosis present

## 2017-01-07 DIAGNOSIS — F429 Obsessive-compulsive disorder, unspecified: Secondary | ICD-10-CM | POA: Insufficient documentation

## 2017-01-07 DIAGNOSIS — J42 Unspecified chronic bronchitis: Secondary | ICD-10-CM | POA: Insufficient documentation

## 2017-01-07 DIAGNOSIS — R279 Unspecified lack of coordination: Secondary | ICD-10-CM | POA: Insufficient documentation

## 2017-01-07 LAB — BASIC METABOLIC PANEL
Anion gap: 6 (ref 5–15)
BUN: 19 mg/dL (ref 6–20)
CHLORIDE: 103 mmol/L (ref 101–111)
CO2: 32 mmol/L (ref 22–32)
CREATININE: 0.76 mg/dL (ref 0.44–1.00)
Calcium: 8.5 mg/dL — ABNORMAL LOW (ref 8.9–10.3)
GFR calc Af Amer: >60 mL/min (ref >60)
GFR calc non Af Amer: >60 mL/min (ref >60)
Glucose, Bld: 99 mg/dL (ref 65–99)
POTASSIUM: 3.8 mmol/L (ref 3.5–5.1)
Sodium: 141 mmol/L (ref 135–145)

## 2017-01-14 ENCOUNTER — Encounter: Payer: Self-pay | Admitting: Internal Medicine

## 2017-01-14 ENCOUNTER — Encounter (HOSPITAL_COMMUNITY)
Admission: RE | Admit: 2017-01-14 | Discharge: 2017-01-14 | Disposition: A | Discharge status: Home / Self Care | Payer: Medicare Other | Source: Skilled Nursing Facility | Attending: Internal Medicine | Admitting: Internal Medicine

## 2017-01-14 ENCOUNTER — Non-Acute Institutional Stay (SKILLED_NURSING_FACILITY): Payer: Medicare Other | Admitting: Internal Medicine

## 2017-01-14 DIAGNOSIS — E876 Hypokalemia: Secondary | ICD-10-CM

## 2017-01-14 DIAGNOSIS — I10 Essential (primary) hypertension: Secondary | ICD-10-CM | POA: Diagnosis not present

## 2017-01-14 DIAGNOSIS — R279 Unspecified lack of coordination: Secondary | ICD-10-CM | POA: Insufficient documentation

## 2017-01-14 DIAGNOSIS — M6281 Muscle weakness (generalized): Secondary | ICD-10-CM | POA: Diagnosis present

## 2017-01-14 DIAGNOSIS — J42 Unspecified chronic bronchitis: Secondary | ICD-10-CM | POA: Diagnosis present

## 2017-01-14 DIAGNOSIS — F429 Obsessive-compulsive disorder, unspecified: Secondary | ICD-10-CM | POA: Diagnosis present

## 2017-01-14 DIAGNOSIS — K219 Gastro-esophageal reflux disease without esophagitis: Secondary | ICD-10-CM | POA: Diagnosis present

## 2017-01-14 DIAGNOSIS — F3289 Other specified depressive episodes: Secondary | ICD-10-CM | POA: Diagnosis present

## 2017-01-14 LAB — BASIC METABOLIC PANEL
Anion gap: 8 (ref 5–15)
BUN: 17 mg/dL (ref 6–20)
CALCIUM: 8.9 mg/dL (ref 8.9–10.3)
CO2: 31 mmol/L (ref 22–32)
CREATININE: 0.8 mg/dL (ref 0.44–1.00)
Chloride: 102 mmol/L (ref 101–111)
Glucose, Bld: 101 mg/dL — ABNORMAL HIGH (ref 65–99)
Potassium: 3.9 mmol/L (ref 3.5–5.1)
SODIUM: 141 mmol/L (ref 135–145)

## 2017-01-14 NOTE — Progress Notes (Signed)
Location:   [facility] Room Number: 145/D Place of Service:  SNF [phone]) Provider:  Valerie Roys, MD  Patient Care Team: Kerri Perches, MD as PCP - General [place] Bali, MD (Gastroenterology)  Extended Emergency Contact Information Primary Emergency Contact: [facility] Address: [address]          [name], [place] [address] [place] Home Phone: [phone] Mobile Phone: [phone] Relation: Daughter Secondary Emergency Contact: Vivien Presto [place] Mobile Phone: [phone] Relation: Son  Code Status:  Full Code Goals of care: Advanced Directive information Advanced Directives 01/14/2017  Does Patient Have a Medical Advance Directive? Yes  Type of Advance Directive (No Data)  Does patient want to make changes to medical advance directive? No - Patient declined  Copy of Healthcare Power of Attorney in Chart? -  Would patient like information on creating a medical advance directive? No - Patient declined  Pre-existing out of facility DNR order (yellow form or pink MOST form) -     Chief Complaint  Patient presents with  . Acute Visit  Follow-up hypertension.    HPI:  Pt is a 83 y.o. female seen today for an acute visit for hypertension.  She does have a history of hypertension she is on hydrochlorothiazide 12.5 mg a day.  Nursing's noted at times there are some elevated systolics I see listed 171/91-I also see previous readings of 110/65 and 134/84.  She is not complaining of any headache dizziness syncopal-type feelings   she is not complaining of any pain or discomfort which might elevate her readings  I did take her blood pressure manually and got 120/80    Past Medical History:  Diagnosis Date  . Diverticulosis OCT 2010 TCS   SIGMOID COLON  . Esophageal web 2010   DILATION 16 MM  . Gastritis, Helicobacter pylori 2010   Rx. ABO for 10 days   . GERD  (gastroesophageal reflux disease)   . Hip fracture, left (HCC)    2016  . Hyperlipidemia   . Hypertension   . Restless legs syndrome   . Seizures [facility]) January 2010   Post head trauma in fall .sees Dr Gerilyn Pilgrim   Past Surgical History:  Procedure Laterality Date  . ABDOMINAL HYSTERECTOMY    . COLONOSCOPY  OCT 2010   TORTUOUS, Sml IH, Tics, MULTIPLE SIMPLE ADENOMAS (<6MM)  . HIP ARTHROPLASTY Left 11/19/2014   Procedure: LEFT PARTIAL HIP REPLACEMENT;  Surgeon: Vickki Hearing, MD;  Location: [facility];  Service: Orthopedics;  Laterality: Left;  . ORIF HIP FRACTURE  04/05/2012   Procedure: OPEN REDUCTION INTERNAL FIXATION HIP;  Surgeon: Vickki Hearing, MD;  Location: [facility];  Service: Orthopedics;  Laterality: Right;  . UPPER GASTROINTESTINAL ENDOSCOPY  AUG 2010   SAVARY  . VESICOVAGINAL FISTULA CLOSURE W/ TAH  1966   bleeding     No Known Allergies  Current Outpatient Prescriptions on File Prior to Visit  Medication Sig Dispense Refill  . alendronate (FOSAMAX) 70 MG tablet Take with full glass of water once a day on Monday    . ammonium lactate (LAC-HYDRIN) 12 % lotion Apply to right knee once a day    . aspirin 81 MG tablet Take 81 mg by mouth daily.    . calcium-vitamin D (OSCAL WITH D) 500-200 MG-UNIT TABS TAKE (1) TABLET BY MOUTH (3) TIMES DAILY WITH MEALS. 90 each 3  . docusate sodium (COLACE) 100 MG capsule Take  200 mg by mouth daily.    . ferrous sulfate 325 (65 FE) MG tablet Take 325 mg by mouth daily with breakfast. At least one before or after calcium    . hydrochlorothiazide (HYDRODIURIL) 12.5 MG tablet Take 12.5 mg by mouth daily.    Marland Kitchen HYDROcodone-acetaminophen (NORCO/VICODIN) 5-325 MG tablet Take one tablet by mouth every 6 hours as needed for moderate pain or two tablets by mouth every 6 hours as needed for severe pain. Max APAP 3gm/24 hours from all sources 240 tablet 0  . ipratropium-albuterol (DUONEB) 0.5-2.5 (3) MG/3ML SOLN Give 1 vial inhalation twice a day for  wheezing or SOB    . levETIRAcetam (KEPPRA) 500 MG tablet TAKE 1 TABLET BY MOUTH TWICE A DAY AS DIRECTED. 60 tablet 5  . lovastatin (MEVACOR) 20 MG tablet Take 1 tablet (20 mg total) by mouth at bedtime. 30 tablet 5  . Melatonin 5 MG TABS Take 5 mg by mouth at bedtime.    . Menthol, Topical Analgesic, (BIOFREEZE) 4 % GEL Apply to right shoulder twice a day    . Multiple Vitamin (MULTIVITAMIN) tablet Take 1 tablet by mouth daily.    . polyethylene glycol (MIRALAX / GLYCOLAX) packet Take 17 g by mouth daily. Hold for diarrhea    . tiotropium (SPIRIVA) 18 MCG inhalation capsule Give 2 puffs once a day    . ZINC OXIDE EX Apply topically. Apply zinc oxide every shift prn apply to left and right sides under the breast where the bra rest     No current facility-administered medications on file prior to visit.      Review of Systems   General does not complaining fever or chills.  Eyes is not complaining of any visual changes.  Respiratory denies shortness breath or cough.  Cardiac does not complain of any chest pain has some chronic lower extremity edema.  GI does not complain of abdominal discomfort.  Muscle skeletal at this point is not complaining of joint pain has complained of some shoulder pain in the past.  Neurologic not complaining of any dizziness headache or syncope.    Immunization History  Administered Date(s) Administered  . Influenza Split 07/25/2012  . Influenza,inj,Quad PF,36+ Mos 07/30/2013, 08/18/2014  . Influenza-Unspecified 07/06/2016  . PPD Test 12/06/2014  . Pneumococcal Conjugate-13 04/29/2014  . Pneumococcal Polysaccharide-23 07/25/2012  . Tdap 08/08/2011   Pertinent  Health Maintenance Due  Topic Date Due  . INFLUENZA VACCINE  05/08/2017  . DEXA SCAN  Completed  . PNA vac Low Risk Adult  Completed   Fall Risk  04/29/2014 07/30/2013  Falls in the past year? Yes Yes  Number falls in past yr: 1 2 or more  Injury with Fall? No -  Risk for fall due to  : - Impaired balance/gait;Impaired mobility;History of fall(s)   Functional Status Survey:    She is afebrile pulse is 66 respirations 18 blood pressure taken manually was 120/80 previous blood pressures as noted above  Physical Exam   In general this is a pleasant somewhat obese elderly female in no distress sitting comfortably in her wheelchair.  Her skin is warm and dry-.  Eyes she has prescription lenses visual acuity appears grossly intact.  Chest is clear to auscultation there is no labored breathing.  Heart is distant heart sounds from what I could hear was regular rate and rhythm cannot appreciate a murmur gallop or rub-radial pulse was regular.  Abdomen soft obese soft nontender with positive bowel sounds.  Musculoskeletal  has baseline lower extremity weakness upper extremity strength appears preserved she does have baseline lower extremity edema I would say one plus.  Neurologic is grossly intact her speech is clear no lateralizing findings.    Labs reviewed:  Recent Labs  01/04/17 0600 01/07/17 0700 01/14/17 0700  NA 142 141 141  K 3.4* 3.8 3.9  CL 102 103 102  CO2 33* 32 31  GLUCOSE 80 99 101*  BUN 25* 19 17  CREATININE 0.73 0.76 0.80  CALCIUM 8.6* 8.5* 8.9    Recent Labs  02/01/16 0750 05/08/16 0731 08/03/16 0730  AST 22 32 27  ALT 21 27 39  ALKPHOS 115 73 111  BILITOT 0.5 0.5 0.8  PROT 6.6 7.0 7.8  ALBUMIN 3.4* 3.4* 3.9    Recent Labs  02/01/16 0750  05/08/16 0731 08/03/16 0730 10/17/16 0500 01/04/17 0600  WBC 4.9  < > 6.0 6.3 4.7 5.4  NEUTROABS 1.9  --  3.0  --  2.1  --   HGB 12.3  < > 12.2 13.5 12.7 12.5  HCT 37.6  < > 37.9 41.5 38.2 37.9  MCV 90.8  < > 94.3 92.6 92.3 93.1  PLT 179  < > 169 210 163 136*  < > = values in this interval not displayed. Lab Results  Component Value Date   TSH 1.279 01/04/2017   No results found for: HGBA1C Lab Results  Component Value Date   CHOL 117 01/04/2017   HDL 39 (L) 01/04/2017    LDLCALC 62 01/04/2017   TRIG 80 01/04/2017   CHOLHDL 3.0 01/04/2017    Significant Diagnostic Results in last 30 days:  No results found.  Assessment/Plan 1 hypertension-manual reading today was satisfactory-she does have a rather large arm and I suspect possibly  higher readings may be from a smaller cuff which gives an inaccurate readings-will write an order to check her blood pressure and pulse daily with a large cuff  with a log for review by provider in approximately a week.  #2 she does have some history of mild hypokalemia has been started on low-dose potassium will update a metabolic panel in a couple weeks this appears to have stabilized.  [id number]

## 2017-01-28 ENCOUNTER — Encounter (HOSPITAL_COMMUNITY)
Admission: RE | Admit: 2017-01-28 | Discharge: 2017-01-28 | Disposition: A | Discharge status: Home / Self Care | Payer: Medicare Other | Source: Skilled Nursing Facility | Attending: Internal Medicine | Admitting: Internal Medicine

## 2017-01-28 DIAGNOSIS — I10 Essential (primary) hypertension: Secondary | ICD-10-CM | POA: Diagnosis not present

## 2017-01-28 DIAGNOSIS — M6281 Muscle weakness (generalized): Secondary | ICD-10-CM | POA: Diagnosis present

## 2017-01-28 DIAGNOSIS — R279 Unspecified lack of coordination: Secondary | ICD-10-CM | POA: Diagnosis present

## 2017-01-28 DIAGNOSIS — K219 Gastro-esophageal reflux disease without esophagitis: Secondary | ICD-10-CM | POA: Insufficient documentation

## 2017-01-28 DIAGNOSIS — J42 Unspecified chronic bronchitis: Secondary | ICD-10-CM | POA: Diagnosis present

## 2017-01-28 DIAGNOSIS — F3289 Other specified depressive episodes: Secondary | ICD-10-CM | POA: Diagnosis present

## 2017-01-28 DIAGNOSIS — F429 Obsessive-compulsive disorder, unspecified: Secondary | ICD-10-CM | POA: Diagnosis present

## 2017-01-28 LAB — BASIC METABOLIC PANEL
ANION GAP: 7 (ref 5–15)
BUN: 19 mg/dL (ref 6–20)
CALCIUM: 8.7 mg/dL — AB (ref 8.9–10.3)
CHLORIDE: 101 mmol/L (ref 101–111)
CO2: 32 mmol/L (ref 22–32)
Creatinine, Ser: 0.77 mg/dL (ref 0.44–1.00)
GFR calc Af Amer: >60 mL/min (ref >60)
GFR calc non Af Amer: >60 mL/min (ref >60)
GLUCOSE: 95 mg/dL (ref 65–99)
POTASSIUM: 3.8 mmol/L (ref 3.5–5.1)
Sodium: 140 mmol/L (ref 135–145)

## 2017-02-04 ENCOUNTER — Non-Acute Institutional Stay (SKILLED_NURSING_FACILITY): Payer: Medicare Other | Admitting: Internal Medicine

## 2017-02-04 ENCOUNTER — Encounter: Payer: Self-pay | Admitting: Internal Medicine

## 2017-02-04 DIAGNOSIS — D696 Thrombocytopenia, unspecified: Secondary | ICD-10-CM

## 2017-02-04 DIAGNOSIS — D509 Iron deficiency anemia, unspecified: Secondary | ICD-10-CM

## 2017-02-04 DIAGNOSIS — G40909 Epilepsy, unspecified, not intractable, without status epilepticus: Secondary | ICD-10-CM

## 2017-02-04 DIAGNOSIS — I10 Essential (primary) hypertension: Secondary | ICD-10-CM

## 2017-02-04 DIAGNOSIS — F418 Other specified anxiety disorders: Secondary | ICD-10-CM

## 2017-02-04 DIAGNOSIS — M810 Age-related osteoporosis without current pathological fracture: Secondary | ICD-10-CM | POA: Diagnosis not present

## 2017-02-04 NOTE — Progress Notes (Signed)
Location:   [facility] Room Number: 145/D Place of Service:  SNF (31) Provider:  Valerie Roys, MD  Patient Care Team: Kerri Perches, MD as PCP - General West Bali, MD (Gastroenterology)  Extended Emergency Contact Information Primary Emergency Contact: [facility] Address: [address]          [name], [place] [address] [place] of [place] Home Phone: [phone] Mobile Phone: [phone] Relation: Daughter Secondary Emergency Contact: Vivien Presto [place] of [place] Mobile Phone: [phone] Relation: Son  Code Status:  Full Code Goals of care: Advanced Directive information Advanced Directives 02/04/2017  Does Patient Have a Medical Advance Directive? Yes  Type of Advance Directive (No Data)  Does patient want to make changes to medical advance directive? No - Patient declined  Copy of Healthcare Power of Attorney in Chart? -  Would patient like information on creating a medical advance directive? No - Patient declined  Pre-existing out of facility DNR order (yellow form or pink MOST form) -     Chief Complaint  Patient presents with  . Medical Management of Chronic Issues    Routine Visit  Her medical management of chronic issues including hypertension seizure disorder anemia chronic venous stasis-chronic bronchitis-osteoporosis with history of femoral neck fracture-depression-hyperlipidemia  HPI:  Pt is a 83 y.o. female seen today for medical management of chronic diseases. As noted above.  At times she will complain of cough and wheezing but this is stabilized she is on Spiriva and when necessary nebulizers.  She also had a lesion on her right foot that was debridement by podiatry-she currently has wrapping of her right second toe but says the discomfort is improved.  She does have a history of hypertension appears to have some variable listed blood pressures I got 140/80 manually as he  previous 132/85-148/96 she is on hydrochlorothiazide.  She also has a history of anemia she is on iron hemoglobin was 12.5 on lab done 01/05/2007 3.  She has chronic venous stasis with edema on the right greater than the left which is not new she's not complaining of any shortness of breath or increased lower extremity weakness from baseline.  She also has a significant history of arthritis especially of her right shoulder she does receive topical bio freeze she is also receiving Norco every 6 hours when necessary and Tylenol arthritis every morning and this appears to be improved as well    She appears to have a good appetite weight is stable at 263 pounds.     Past Medical History:  Diagnosis Date  . Diverticulosis OCT 2010 TCS   SIGMOID COLON  . Esophageal web 2010   DILATION 16 MM  . Gastritis, Helicobacter pylori 2010   Rx. ABO for 10 days   . GERD (gastroesophageal reflux disease)   . Hip fracture, left (HCC)    2016  . Hyperlipidemia   . Hypertension   . Restless legs syndrome   . Seizures [facility]) January 2010   Post head trauma in fall .sees Dr Gerilyn Pilgrim   Past Surgical History:  Procedure Laterality Date  . ABDOMINAL HYSTERECTOMY    . COLONOSCOPY  OCT 2010   TORTUOUS, Sml IH, Tics, MULTIPLE SIMPLE ADENOMAS (<6MM)  . HIP ARTHROPLASTY Left 11/19/2014   Procedure: LEFT PARTIAL HIP REPLACEMENT;  Surgeon: Vickki Hearing, MD;  Location: [facility];  Service: Orthopedics;  Laterality: Left;  . ORIF HIP FRACTURE  04/05/2012   Procedure: OPEN REDUCTION INTERNAL FIXATION  HIP;  Surgeon: Vickki Hearing, MD;  Location: [facility];  Service: Orthopedics;  Laterality: Right;  . UPPER GASTROINTESTINAL ENDOSCOPY  AUG 2010   SAVARY  . VESICOVAGINAL FISTULA CLOSURE W/ TAH  1966   bleeding     No Known Allergies  Outpatient Encounter Prescriptions as of 02/04/2017  Medication Sig  . acetaminophen (TYLENOL) 650 MG CR tablet Take 1,300 mg by mouth daily.  Marland Kitchen alendronate (FOSAMAX) 70 MG  tablet Take with full glass of water once a day on Monday  . ammonium lactate (LAC-HYDRIN) 12 % lotion Apply to right knee once a day  . aspirin 81 MG tablet Take 81 mg by mouth daily.  . calcium-vitamin D (OSCAL WITH D) 500-200 MG-UNIT TABS TAKE (1) TABLET BY MOUTH (3) TIMES DAILY WITH MEALS.  Marland Kitchen docusate sodium (COLACE) 100 MG capsule Take 200 mg by mouth daily.  . ferrous sulfate 325 (65 FE) MG tablet Take 325 mg by mouth daily with breakfast. At least one before or after calcium  . FLUoxetine (PROZAC) 10 MG tablet Take 1 tablet 10 mg by mouth along with 20 mg to equal 30 mg once a day  . FLUoxetine (PROZAC) 20 MG tablet Take 1 tablet 20 mg along with 10 mg to equal 30 mg by mouth once a day  . hydrochlorothiazide (HYDRODIURIL) 12.5 MG tablet Take 12.5 mg by mouth daily.  Marland Kitchen HYDROcodone-acetaminophen (NORCO/VICODIN) 5-325 MG tablet Take one tablet by mouth every 6 hours as needed for moderate pain or two tablets by mouth every 6 hours as needed for severe pain. Max APAP 3gm/24 hours from all sources  . ipratropium-albuterol (DUONEB) 0.5-2.5 (3) MG/3ML SOLN Give 1 vial inhalation twice a day for wheezing or SOB  . levETIRAcetam (KEPPRA) 500 MG tablet TAKE 1 TABLET BY MOUTH TWICE A DAY AS DIRECTED.  Marland Kitchen lovastatin (MEVACOR) 20 MG tablet Take 1 tablet (20 mg total) by mouth at bedtime.  . Melatonin 5 MG TABS Take 5 mg by mouth at bedtime.  . Menthol, Topical Analgesic, (BIOFREEZE) 4 % GEL Apply to right shoulder twice a day  . Multiple Vitamin (MULTIVITAMIN) tablet Take 1 tablet by mouth daily.  . polyethylene glycol (MIRALAX / GLYCOLAX) packet Take 17 g by mouth daily. Hold for diarrhea  . potassium chloride (K-DUR,KLOR-CON) 10 MEQ tablet Take 10 mEq by mouth daily.  Marland Kitchen tiotropium (SPIRIVA) 18 MCG inhalation capsule Give 2 puffs once a day  . ZINC OXIDE EX Apply topically. Apply zinc oxide every shift prn apply to left and right sides under the breast where the bra rest   No facility-administered  encounter medications on file as of 02/04/2017.      Review of Systems  In general does not complaining of any fever or chills her weight appears to be relatively stable.  Skin does not complain of rashes or itching area  Head ears eyes nose mouth and throat does not complain of visual changes has prescription lenses does not complain of sore throat.  Respiratory denies shortness of breath or cough.  Cardiac does not clinic chest pain has chronic lower extremity edema venous stasis changes.  GI is not complaining of any nausea vomiting diarrhea constipation or abdominal discomfort.  GU denies dysuria.  Muscle skeletal has history of ulcer I arthritis especially of the shoulder on the right but says the pain appears to be a bit better controlled.  Neurologic is not complaining of dizziness headache at this time.  Insight does have a history of  mild dementia depression that appears to be stable   Immunization History  Administered Date(s) Administered  . Influenza Split 07/25/2012  . Influenza,inj,Quad PF,36+ Mos 07/30/2013, 08/18/2014  . Influenza-Unspecified 07/06/2016  . PPD Test 12/06/2014  . Pneumococcal Conjugate-13 04/29/2014  . Pneumococcal Polysaccharide-23 07/25/2012  . Tdap 08/08/2011   Pertinent  Health Maintenance Due  Topic Date Due  . INFLUENZA VACCINE  05/08/2017  . DEXA SCAN  Completed  . PNA vac Low Risk Adult  Completed   Fall Risk  04/29/2014 07/30/2013  Falls in the past year? Yes Yes  Number falls in past yr: 1 2 or more  Injury with Fall? No -  Risk for fall due to : - Impaired balance/gait;Impaired mobility;History of fall(s)   Functional Status Survey:    Vitals:   02/04/17 1615  BP: 132/85  Pulse: 66  Resp: 18  Temp: 98.2 F (36.8 C)  TempSrc: Oral  Weight: 263 lb (119.3 kg)  Height:  (1.702 m)   Body mass index is 41.19 kg/m. Physical Exam   In general this is a pleasant elderly female in no distress sitting comfortably in  her wheelchair.  Her skin is warm and dry. She does have covering over her right second toe with what appears to be a callus that was removed.  Eyes she does have prescription lenses visual acuity appears to be at baseline.  Oropharynx is clear mucous membranes moist.  Chest is clear to auscultation there is no labored breathing somewhat shallow air entry.  Heart is regular rate and rhythm without murmur gallop or rub she has baseline lower extremity edema right greater than left which is not new.  Abdomen is obese soft nontender positive bowel sounds.  Musculoskeletal continues to have leg wheelchair moves all extremities 4 with limited range of motion of her right shoulder compared to the left grip strength appears to be intact bilaterally she has chronic lower extremity weakness.  Neurologic is grossly intact her speech is clear no lateralizing findings.  Psych she is pleasant and appropriate with some mild cognitive deficits but does well with supportive care she is pleasant and appropriate    Labs reviewed:  Recent Labs  01/07/17 0700 01/14/17 0700 01/28/17 0733  NA 141 141 140  K 3.8 3.9 3.8  CL 103 102 101  CO2 32 31 32  GLUCOSE 99 101* 95  BUN CREATININE 0.76 0.80 0.77  CALCIUM 8.5* 8.9 8.7*    Recent Labs  05/08/16 0731 08/03/16 0730  AST 32 27  ALT 27 39  ALKPHOS 73 111  BILITOT 0.5 0.8  PROT 7.0 7.8  ALBUMIN 3.4* 3.9    Recent Labs  05/08/16 0731 08/03/16 0730 10/17/16 0500 01/04/17 0600  WBC 6.0 6.3 4.7 5.4  NEUTROABS 3.0  --  2.1  --   HGB 12.2 13.5 12.7 12.5  HCT 37.9 41.5 38.2 37.9  MCV 94.3 92.6 92.3 93.1  PLT 169 210 163 136*   Lab Results  Component Value Date   TSH 1.279 01/04/2017   No results found for: HGBA1C Lab Results  Component Value Date   CHOL 117 01/04/2017   HDL 39 (L) 01/04/2017   LDLCALC 62 01/04/2017   TRIG 80 01/04/2017   CHOLHDL 3.0 01/04/2017    Significant Diagnostic Results in last 30 days:    No results found.  Assessment/Plan  1 hypertension has variable systolics but does not appear to be a consistently elevated at this point will  monitor continues on hydrochlorothiazide with potassium supplementation.  #2 history seizure disorder this is been stable for some time on Keppra.  #3 anemia continues on iron hemoglobin 12.5 on lab done on 01/04/2017 will update this especially in light of borderline low platelets 136,000.  4 thrombocytopenia mild as noted above-will update a CBC.--Previous platelet counts have been 163000-210000-  5 history of venous stasis changes this appears to be at baseline edema with right greater than left previous Dopplers of the right have not shown any DVT.  #6 history of osteoporosis she is status post femoral neck fracture she is on Fosamax as well as calcium.  #7 history depression she is on Prozac this appears to be stable she does well with supportive care here.  #8 history hyperlipidemia continues on lovastatin LDL was 62 on lab done 01/04/2017.  #9 history of bronchitis this is been stable now for some time she is on Spiriva as well as when necessary nebulizers chest exam today was fairly benign.  #10 history of insomnia she is on melatonin she is not complaining of insomnia is afternoon.  11 history of osteoarthritis especially of her right shoulder again x-ray showed degenerative changes she is receiving topical Biofreeze as well as Norco when necessary and Tylenol arthritis routinely the morning.  12 right foot lesion this has been removed by podiatry currently there is covering of the toe-pain appears to be under good control she is not really complaining of discomfort today-will monitor  Note   with her history of thrombocytopenia will update he CBC-also will update a metabolic panel keep an eye on her liver function tests as well as electrolytes.  [id number] note greater than 35 minutes spent assessing patient-reviewing her chart is  reviewed her labs-and coordinating and formulating plan of care for numerous diagnoses-of note greater than 50% of time spent coordinating plan of care

## 2017-02-05 ENCOUNTER — Encounter (HOSPITAL_COMMUNITY)
Admission: RE | Admit: 2017-02-05 | Discharge: 2017-02-05 | Disposition: A | Discharge status: Home / Self Care | Payer: Medicare Other | Source: Skilled Nursing Facility | Attending: Internal Medicine | Admitting: Internal Medicine

## 2017-02-05 DIAGNOSIS — F0390 Unspecified dementia without behavioral disturbance: Secondary | ICD-10-CM | POA: Insufficient documentation

## 2017-02-05 DIAGNOSIS — M6281 Muscle weakness (generalized): Secondary | ICD-10-CM | POA: Diagnosis present

## 2017-02-05 DIAGNOSIS — I10 Essential (primary) hypertension: Secondary | ICD-10-CM | POA: Diagnosis not present

## 2017-02-05 DIAGNOSIS — F3289 Other specified depressive episodes: Secondary | ICD-10-CM | POA: Insufficient documentation

## 2017-02-05 DIAGNOSIS — J42 Unspecified chronic bronchitis: Secondary | ICD-10-CM | POA: Insufficient documentation

## 2017-02-05 DIAGNOSIS — F482 Pseudobulbar affect: Secondary | ICD-10-CM | POA: Diagnosis present

## 2017-02-05 DIAGNOSIS — R279 Unspecified lack of coordination: Secondary | ICD-10-CM | POA: Insufficient documentation

## 2017-02-05 LAB — CBC WITH DIFFERENTIAL/PLATELET
BASOS ABS: 0 10*3/uL (ref 0.0–0.1)
BASOS PCT: 0 %
Eosinophils Absolute: 0.2 10*3/uL (ref 0.0–0.7)
Eosinophils Relative: 3 %
HCT: 43.3 % (ref 36.0–46.0)
HEMOGLOBIN: 14.3 g/dL (ref 12.0–15.0)
LYMPHS ABS: 2.2 10*3/uL (ref 0.7–4.0)
Lymphocytes Relative: 39 %
MCH: 30.8 pg (ref 26.0–34.0)
MCHC: 33 g/dL (ref 30.0–36.0)
MCV: 93.3 fL (ref 78.0–100.0)
MONO ABS: 0.7 10*3/uL (ref 0.1–1.0)
MONOS PCT: 11 %
NEUTROS ABS: 2.7 10*3/uL (ref 1.7–7.7)
Neutrophils Relative %: 47 %
Platelets: 158 10*3/uL (ref 150–400)
RBC: 4.64 MIL/uL (ref 3.87–5.11)
RDW: 14.4 % (ref 11.5–15.5)
WBC: 5.8 10*3/uL (ref 4.0–10.5)

## 2017-02-05 LAB — COMPREHENSIVE METABOLIC PANEL
ALBUMIN: 4.1 g/dL (ref 3.5–5.0)
ALK PHOS: 58 U/L (ref 38–126)
ALT: 18 U/L (ref 14–54)
ANION GAP: 8 (ref 5–15)
AST: 25 U/L (ref 15–41)
BUN: 23 mg/dL — ABNORMAL HIGH (ref 6–20)
CALCIUM: 9.2 mg/dL (ref 8.9–10.3)
CO2: 32 mmol/L (ref 22–32)
CREATININE: 0.84 mg/dL (ref 0.44–1.00)
Chloride: 99 mmol/L — ABNORMAL LOW (ref 101–111)
GFR calc Af Amer: >60 mL/min (ref >60)
GFR calc non Af Amer: >60 mL/min (ref >60)
GLUCOSE: 105 mg/dL — AB (ref 65–99)
Potassium: 4 mmol/L (ref 3.5–5.1)
SODIUM: 139 mmol/L (ref 135–145)
Total Bilirubin: 0.7 mg/dL (ref 0.3–1.2)
Total Protein: 7.6 g/dL (ref 6.5–8.1)

## 2017-03-11 ENCOUNTER — Non-Acute Institutional Stay (SKILLED_NURSING_FACILITY): Payer: Medicare Other | Admitting: Internal Medicine

## 2017-03-11 ENCOUNTER — Encounter: Payer: Self-pay | Admitting: Internal Medicine

## 2017-03-11 DIAGNOSIS — R635 Abnormal weight gain: Secondary | ICD-10-CM

## 2017-03-11 NOTE — Progress Notes (Signed)
Location:   [facility] Room Number: 145/D Place of Service:  SNF [phone]) Provider:  Odessa Fleming, MD  Patient Care Team: Kerri Perches, MD as PCP - General [place] Bali, MD (Gastroenterology)  Extended Emergency Contact Information Primary Emergency Contact: [facility] Address: [address]          [name], [place] [address] [place] of [place] Home Phone: [phone] Mobile Phone: [phone] Relation: Daughter Secondary Emergency Contact: Vivien Presto [place] Mobile Phone: [phone] Relation: Son  Code Status:  Full Code Goals of care: Advanced Directive information Advanced Directives 03/11/2017  Does Patient Have a Medical Advance Directive? Yes  Type of Advance Directive (No Data)  Does patient want to make changes to medical advance directive? No - Patient declined  Copy of Healthcare Power of Attorney in Chart? -  Would patient like information on creating a medical advance directive? No - Patient declined  Pre-existing out of facility DNR order (yellow form or pink MOST form) -     Chief Complaint  Patient presents with  . Acute Visit    Weight gain    HPI:  Pt is a 83 y.o. female seen today for an acute visit for Weight gain.  Nursing staff has noted some weight gain currently 265 pounds-it appears back in December she was 252 pounds however per review it appears over the last couple months since February that her weight has been around 260 I see listed 2 63 back in April-it did go down to2 58 in May although again there may be some scale variation here most recent weight is 265  She does have chronic venous stasis changes has compression hose on and edema does not appear to read really changed from baseline.  She does not have a listed history of congestive heart failure. Last echo she had-2010 which showed infection fraction of 55%.  She does not complain of any  increased shortness of breath beyond baseline  According to nursing her roommate patient does snack quite considerably and I note she appears to eat most of her meals in fact she appeared to have largely cleaned her plate tonight.--According to nursing at times she will leave some food on her tray in favor does not.  Did discuss this with her and said we certainly want her to beatbut if we could cut down on snacks it would be desirable she expressed understanding although I suspect this may be a challenge  She does have a history of hyperlipidemia he is on a statin and this appears to actually be stable her LDL was actually 62 on lab done in late March      Past Medical History:  Diagnosis Date  . Diverticulosis OCT 2010 TCS   SIGMOID COLON  . Esophageal web 2010   DILATION 16 MM  . Gastritis, Helicobacter pylori 2010   Rx. ABO for 10 days   . GERD (gastroesophageal reflux disease)   . Hip fracture, left (HCC)    2016  . Hyperlipidemia   . Hypertension   . Restless legs syndrome   . Seizures [facility]) January 2010   Post head trauma in fall .sees Dr Gerilyn Pilgrim   Past Surgical History:  Procedure Laterality Date  . ABDOMINAL HYSTERECTOMY    . COLONOSCOPY  OCT 2010   TORTUOUS, Sml IH, Tics, MULTIPLE SIMPLE ADENOMAS (<6MM)  . HIP ARTHROPLASTY Left 11/19/2014   Procedure: LEFT PARTIAL HIP REPLACEMENT;  Surgeon: Duffy Rhody  Elita Quick, MD;  Location: [facility];  Service: Orthopedics;  Laterality: Left;  . ORIF HIP FRACTURE  04/05/2012   Procedure: OPEN REDUCTION INTERNAL FIXATION HIP;  Surgeon: Vickki Hearing, MD;  Location: [facility];  Service: Orthopedics;  Laterality: Right;  . UPPER GASTROINTESTINAL ENDOSCOPY  AUG 2010   SAVARY  . VESICOVAGINAL FISTULA CLOSURE W/ TAH  1966   bleeding     No Known Allergies  Outpatient Encounter Prescriptions as of 03/11/2017  Medication Sig  . acetaminophen (TYLENOL) 650 MG CR tablet Take 1,300 mg by mouth daily.  Marland Kitchen alendronate (FOSAMAX) 70 MG tablet  Take with full glass of water once a day on Monday  . ammonium lactate (LAC-HYDRIN) 12 % lotion Apply to right knee once a day  . aspirin 81 MG tablet Take 81 mg by mouth daily.  . calcium-vitamin D (OSCAL WITH D) 500-200 MG-UNIT TABS TAKE (1) TABLET BY MOUTH (3) TIMES DAILY WITH MEALS.  Marland Kitchen docusate sodium (COLACE) 100 MG capsule Take 200 mg by mouth daily.  Marland Kitchen FLUoxetine (PROZAC) 10 MG tablet Take 1 tablet 10 mg by mouth along with 20 mg to equal 30 mg once a day  . FLUoxetine (PROZAC) 20 MG tablet Take 1 tablet 20 mg along with 10 mg to equal 30 mg by mouth once a day  . hydrochlorothiazide (HYDRODIURIL) 12.5 MG tablet Take 12.5 mg by mouth daily.  Marland Kitchen HYDROcodone-acetaminophen (NORCO/VICODIN) 5-325 MG tablet Take one tablet by mouth every 6 hours as needed for moderate pain or two tablets by mouth every 6 hours as needed for severe pain. Max APAP 3gm/24 hours from all sources  . ipratropium-albuterol (DUONEB) 0.5-2.5 (3) MG/3ML SOLN Give 1 vial inhalation twice a day for wheezing or SOB  . levETIRAcetam (KEPPRA) 500 MG tablet TAKE 1 TABLET BY MOUTH TWICE A DAY AS DIRECTED.  Marland Kitchen lovastatin (MEVACOR) 20 MG tablet Take 1 tablet (20 mg total) by mouth at bedtime.  . Melatonin 3 MG TABS Take 3 mg by mouth at bedtime.  . Menthol, Topical Analgesic, (BIOFREEZE) 4 % GEL Apply to right shoulder twice a day  . Multiple Vitamin (MULTIVITAMIN) tablet Take 1 tablet by mouth daily.  . polyethylene glycol (MIRALAX / GLYCOLAX) packet Take 17 g by mouth daily. Hold for diarrhea  . potassium chloride (K-DUR,KLOR-CON) 10 MEQ tablet Take 10 mEq by mouth daily.  Marland Kitchen tiotropium (SPIRIVA) 18 MCG inhalation capsule Give 2 puffs once a day  . ZINC OXIDE EX Apply topically. Apply zinc oxide every shift prn apply to left and right sides under the breast where the bra rest  . [DISCONTINUED] ferrous sulfate 325 (65 FE) MG tablet Take 325 mg by mouth daily with breakfast. At least one before or after calcium  . [DISCONTINUED]  Melatonin 5 MG TABS Take 5 mg by mouth at bedtime.   No facility-administered encounter medications on file as of 03/11/2017.     Review of Systems   General she does not complaining fever or chills has some weight gain although it does not appear precipitous the last couple months.  Skin does not complain of rashes or itching.   Head ears eyes nose mouth and throat does not complain of visual changes has prescription lenses does not complain of sore throat.  Respiratory denies shortness of breath does have a cough occasionally says it's not greater than baseline  Cardiac does not complain of  chest pain has chronic lower extremity edema venous stasis changes.  GI is not complaining  of any nausea vomiting diarrhea constipation or abdominal discomfort.  GU denies dysuria.  Muscle skeletal has history of osteoarthritis of the right shoulder but does not really complain of shoulder pain this evening .  Neurologic is not complaining of dizziness headache at this time.  Pysch--t does have a history of mild dementia depression that appears to be stable   Immunization History  Administered Date(s) Administered  . Influenza Split 07/25/2012  . Influenza,inj,Quad PF,36+ Mos 07/30/2013, 08/18/2014  . Influenza-Unspecified 07/06/2016  . PPD Test 12/06/2014  . Pneumococcal Conjugate-13 04/29/2014  . Pneumococcal Polysaccharide-23 07/25/2012  . Tdap 08/08/2011   Pertinent  Health Maintenance Due  Topic Date Due  . INFLUENZA VACCINE  05/08/2017  . DEXA SCAN  Completed  . PNA vac Low Risk Adult  Completed   Fall Risk  04/29/2014 07/30/2013  Falls in the past year? Yes Yes  Number falls in past yr: 1 2 or more  Injury with Fall? No -  Risk for fall due to : - Impaired balance/gait;Impaired mobility;History of fall(s)   Functional Status Survey:    Vital signs-she is afebrile pulses 72 respirations 18 blood pressure 130/71  Weight is 265 pounds  Physical Exam In  general this is a pleasant elderly female in no distress sitting comfortably in her wheelchair.  Her skin is warm and dry. She   Eyes she does have prescription lenses visual acuity appears to be at baseline.  Oropharynx is clear mucous membranes moist.  Chest initially had some slight expiratory wheezing but after coughing this essentially cleaedr there is no labored breathing  Heart is regular rate and rhythm without murmur gallop or rub she has baseline lower extremity edema right greater than left which is not new this does not appear increasing from baseline she has compression hose on bilaterally.  Abdomen is obese soft nontender positive bowel sounds.  Musculoskeletal continues to ambulate in  wheelchair moves all extremities 4 with limited range of motion of her right shoulder  she has chronic lower extremity weakness.  Neurologic is grossly intact her speech is clear no lateralizing findings.  Psych she is pleasant and appropriate with some mild cognitive deficits but does well with supportive care she is pleasant and appropriate Labs reviewed:  Recent Labs  01/14/17 0700 01/28/17 0733 02/05/17 0700  NA 141 140 139  K 3.9 3.8 4.0  CL 102 101 99*  CO2 31 32 32  GLUCOSE 101* 95 105*  BUN 17 19 23*  CREATININE 0.80 0.77 0.84  CALCIUM 8.9 8.7* 9.2    Recent Labs  05/08/16 0731 08/03/16 0730 02/05/17 0700  AST 32 27 25  ALT 27 39 18  ALKPHOS 73 111 58  BILITOT 0.5 0.8 0.7  PROT 7.0 7.8 7.6  ALBUMIN 3.4* 3.9 4.1    Recent Labs  05/08/16 0731  10/17/16 0500 01/04/17 0600 02/05/17 0700  WBC 6.0  < > 4.7 5.4 5.8  NEUTROABS 3.0  --  2.1  --  2.7  HGB 12.2  < > 12.7 12.5 14.3  HCT 37.9  < > 38.2 37.9 43.3  MCV 94.3  < > 92.3 93.1 93.3  PLT 169  < > 163 136* 158  < > = values in this interval not displayed. Lab Results  Component Value Date   TSH 1.279 01/04/2017   No results found for: HGBA1C Lab Results  Component Value Date   CHOL 117  01/04/2017   HDL 39 (L) 01/04/2017   LDLCALC 62 01/04/2017  TRIG 80 01/04/2017   CHOLHDL 3.0 01/04/2017    Significant Diagnostic Results in last 30 days:  No results found.  Assessment/Plan  1 weight gain-from history and clinical evaluation this appears most likely to be appetite related this was discussed with her about trying to cut back on her snacks she expressed understanding although again this may be somewhat challenging at this  point will continue to monitor.  Also will update a BNP-it does not appear she has a significant history of congestive heart failure as noted above.  She does have chronic venous stasis changes and actually appears to be doing well with the compression hose.  Again will await BNP and also will obtain a BMP tomorrow.  [id number]

## 2017-03-12 ENCOUNTER — Encounter (HOSPITAL_COMMUNITY)
Admission: RE | Admit: 2017-03-12 | Discharge: 2017-03-12 | Disposition: A | Discharge status: Home / Self Care | Payer: Medicare Other | Source: Skilled Nursing Facility | Attending: Internal Medicine | Admitting: Internal Medicine

## 2017-03-12 DIAGNOSIS — J42 Unspecified chronic bronchitis: Secondary | ICD-10-CM | POA: Diagnosis present

## 2017-03-12 DIAGNOSIS — F482 Pseudobulbar affect: Secondary | ICD-10-CM | POA: Diagnosis present

## 2017-03-12 DIAGNOSIS — F3289 Other specified depressive episodes: Secondary | ICD-10-CM | POA: Diagnosis present

## 2017-03-12 DIAGNOSIS — R279 Unspecified lack of coordination: Secondary | ICD-10-CM | POA: Insufficient documentation

## 2017-03-12 DIAGNOSIS — R635 Abnormal weight gain: Secondary | ICD-10-CM | POA: Insufficient documentation

## 2017-03-12 DIAGNOSIS — F0390 Unspecified dementia without behavioral disturbance: Secondary | ICD-10-CM | POA: Insufficient documentation

## 2017-03-12 LAB — BASIC METABOLIC PANEL
ANION GAP: 9 (ref 5–15)
BUN: 14 mg/dL (ref 6–20)
CALCIUM: 8.9 mg/dL (ref 8.9–10.3)
CHLORIDE: 100 mmol/L — AB (ref 101–111)
CO2: 30 mmol/L (ref 22–32)
CREATININE: 0.66 mg/dL (ref 0.44–1.00)
GFR calc non Af Amer: >60 mL/min (ref >60)
Glucose, Bld: 95 mg/dL (ref 65–99)
Potassium: 3.8 mmol/L (ref 3.5–5.1)
SODIUM: 139 mmol/L (ref 135–145)

## 2017-03-12 LAB — BRAIN NATRIURETIC PEPTIDE: B NATRIURETIC PEPTIDE 5: 44 pg/mL (ref 0.0–100.0)

## 2017-03-14 ENCOUNTER — Non-Acute Institutional Stay (SKILLED_NURSING_FACILITY): Payer: Medicare Other | Admitting: Internal Medicine

## 2017-03-14 ENCOUNTER — Encounter: Payer: Self-pay | Admitting: Internal Medicine

## 2017-03-14 DIAGNOSIS — M810 Age-related osteoporosis without current pathological fracture: Secondary | ICD-10-CM | POA: Diagnosis not present

## 2017-03-14 DIAGNOSIS — G40909 Epilepsy, unspecified, not intractable, without status epilepticus: Secondary | ICD-10-CM | POA: Diagnosis not present

## 2017-03-14 DIAGNOSIS — F418 Other specified anxiety disorders: Secondary | ICD-10-CM

## 2017-03-14 DIAGNOSIS — I10 Essential (primary) hypertension: Secondary | ICD-10-CM | POA: Diagnosis not present

## 2017-03-14 NOTE — Progress Notes (Signed)
Location:   [facility] Room Number: 145/D Place of Service:  SNF (31) Provider:  Valrie Hart, MD  Patient Care Team: Kerri Perches, MD as PCP - General West Bali, MD (Gastroenterology)  Extended Emergency Contact Information Primary Emergency Contact: Verrastro,Alfreda Address: [address]          [name], [place] [address] [place] Home Phone: [phone] Mobile Phone: [phone] Relation: Daughter Secondary Emergency Contact: Vivien Presto [place] of [place] Mobile Phone: [phone] Relation: Son  Code Status:  Full Code Goals of care: Advanced Directive information Advanced Directives 03/14/2017  Does Patient Have a Medical Advance Directive? Yes  Type of Advance Directive (No Data)  Does patient want to make changes to medical advance directive? No - Patient declined  Copy of Healthcare Power of Attorney in Chart? -  Would patient like information on creating a medical advance directive? No - Patient declined  Pre-existing out of facility DNR order (yellow form or pink MOST form) -     Chief Complaint  Patient presents with  . Medical Management of Chronic Issues    Routine Visit    HPI:  Pt is a 83 y.o. female seen today for medical management of chronic diseases.    Patient has h/o Hypertension, Seizure disorder, Anemia, Chronic Venous stasis, and Arthritis, Chronic Bronchitis, Osteoporosis s/p Femoral neck fracture And depression  Patient was recently seen as she had gained almost 15 lbs since Dec. From 250 to 265 lbs. She says she told her son not to bring any more snacks for her. She also is getting follow up with Lissa Hoard NP  For worsening depression but plan was to start her on Nudexta. But it was not done due to some concern from her son. She continues to be on Prozac. She says she feels depressed sometimes die to her being in the facility. Including crying and  Insomnia Otherwise she is doing well. Her appetite is good. No New Nursing issues. She was c/o increased in number of stools. No diarrhea or Abdominal pain.  Past Medical History:  Diagnosis Date  . Diverticulosis OCT 2010 TCS   SIGMOID COLON  . Esophageal web 2010   DILATION 16 MM  . Gastritis, Helicobacter pylori 2010   Rx. ABO for 10 days   . GERD (gastroesophageal reflux disease)   . Hip fracture, left (HCC)    2016  . Hyperlipidemia   . Hypertension   . Restless legs syndrome   . Seizures [facility]) January 2010   Post head trauma in fall .sees Dr Gerilyn Pilgrim   Past Surgical History:  Procedure Laterality Date  . ABDOMINAL HYSTERECTOMY    . COLONOSCOPY  OCT 2010   TORTUOUS, Sml IH, Tics, MULTIPLE SIMPLE ADENOMAS (<6MM)  . HIP ARTHROPLASTY Left 11/19/2014   Procedure: LEFT PARTIAL HIP REPLACEMENT;  Surgeon: Vickki Hearing, MD;  Location: [facility];  Service: Orthopedics;  Laterality: Left;  . ORIF HIP FRACTURE  04/05/2012   Procedure: OPEN REDUCTION INTERNAL FIXATION HIP;  Surgeon: Vickki Hearing, MD;  Location: [facility];  Service: Orthopedics;  Laterality: Right;  . UPPER GASTROINTESTINAL ENDOSCOPY  AUG 2010   SAVARY  . VESICOVAGINAL FISTULA CLOSURE W/ TAH  1966   bleeding     No Known Allergies  Outpatient Encounter Prescriptions as of 03/14/2017  Medication Sig  . acetaminophen (TYLENOL) 650 MG CR tablet Take 1,300 mg by mouth daily.  Marland Kitchen alendronate (FOSAMAX) 70 MG  tablet Take with full glass of water once a day on Monday  . ammonium lactate (LAC-HYDRIN) 12 % lotion Apply to right knee once a day  . aspirin 81 MG tablet Take 81 mg by mouth daily.  . calcium-vitamin D (OSCAL WITH D) 500-200 MG-UNIT TABS TAKE (1) TABLET BY MOUTH (3) TIMES DAILY WITH MEALS.  Marland Kitchen. docusate sodium (COLACE) 100 MG capsule Take 200 mg by mouth daily.  Marland Kitchen. FLUoxetine (PROZAC) 10 MG tablet Take 1 tablet 10 mg by mouth along with 20 mg to equal 30 mg once a day  . FLUoxetine (PROZAC) 20 MG tablet Take 1  tablet 20 mg along with 10 mg to equal 30 mg by mouth once a day  . hydrochlorothiazide (HYDRODIURIL) 12.5 MG tablet Take 12.5 mg by mouth daily.  Marland Kitchen. HYDROcodone-acetaminophen (NORCO/VICODIN) 5-325 MG tablet Take one tablet by mouth every 6 hours as needed for moderate pain or two tablets by mouth every 6 hours as needed for severe pain. Max APAP 3gm/24 hours from all sources  . ipratropium-albuterol (DUONEB) 0.5-2.5 (3) MG/3ML SOLN Give 1 vial inhalation twice a day for wheezing or SOB  . levETIRAcetam (KEPPRA) 500 MG tablet TAKE 1 TABLET BY MOUTH TWICE A DAY AS DIRECTED.  Marland Kitchen. lovastatin (MEVACOR) 20 MG tablet Take 1 tablet (20 mg total) by mouth at bedtime.  . Melatonin 3 MG TABS Take 3 mg by mouth at bedtime.  . Menthol, Topical Analgesic, (BIOFREEZE) 4 % GEL Apply to right shoulder twice a day  . Multiple Vitamin (MULTIVITAMIN) tablet Take 1 tablet by mouth daily.  . polyethylene glycol (MIRALAX / GLYCOLAX) packet Take 17 g by mouth daily. Hold for diarrhea  . potassium chloride (K-DUR,KLOR-CON) 10 MEQ tablet Take 10 mEq by mouth daily.  Marland Kitchen. tiotropium (SPIRIVA) 18 MCG inhalation capsule Give 2 puffs once a day  . ZINC OXIDE EX Apply topically. Apply zinc oxide every shift prn apply to left and right sides under the breast where the bra rest   No facility-administered encounter medications on file as of 03/14/2017.      Review of Systems  Review of Systems  Constitutional: Negative for activity change, appetite change, chills, diaphoresis, fatigue and fever.  HENT: Negative for mouth sores, postnasal drip, rhinorrhea, sinus pain and sore throat.   Respiratory: Negative for apnea, cough, chest tightness, shortness of breath and wheezing.   Cardiovascular: Negative for chest pain, palpitations and leg swelling.  Gastrointestinal: Negative for abdominal distention, abdominal pain, constipation,  nausea and vomiting.  Genitourinary: Negative for dysuria and frequency.  Musculoskeletal: Negative  for arthralgias, joint swelling and myalgias.  Skin: Negative for rash.  Neurological: Negative for dizziness, syncope, weakness, light-headedness and numbness.  Psychiatric/Behavioral: Negative for behavioral problems, confusion and sleep disturbance.     Immunization History  Administered Date(s) Administered  . Influenza Split 07/25/2012  . Influenza,inj,Quad PF,36+ Mos 07/30/2013, 08/18/2014  . Influenza-Unspecified 07/06/2016  . PPD Test 12/06/2014  . Pneumococcal Conjugate-13 04/29/2014  . Pneumococcal Polysaccharide-23 07/25/2012  . Tdap 08/08/2011   Pertinent  Health Maintenance Due  Topic Date Due  . INFLUENZA VACCINE  05/08/2017  . DEXA SCAN  Completed  . PNA vac Low Risk Adult  Completed   Fall Risk  04/29/2014 07/30/2013  Falls in the past year? Yes Yes  Number falls in past yr: 1 2 or more  Injury with Fall? No -  Risk for fall due to : - Impaired balance/gait;Impaired mobility;History of fall(s)   Functional Status Survey:  Vitals:   03/14/17 1529  BP: (!) 144/75  Pulse: 67  Resp: 20  Temp: 97.4 F (36.3 C)   There is no height or weight on file to calculate BMI. Physical Exam  Constitutional: She appears well-developed and well-nourished.  HENT:  Head: Normocephalic.  Mouth/Throat: Oropharynx is clear and moist.  Eyes: Pupils are equal, round, and reactive to light.  Neck: Neck supple.  Cardiovascular: Normal rate, regular rhythm and normal heart sounds.   No murmur heard. Pulmonary/Chest: Effort normal and breath sounds normal. No respiratory distress. She has no wheezes. She has no rales.  Abdominal: Soft. Bowel sounds are normal. She exhibits no distension. There is no tenderness. There is no rebound.  Musculoskeletal: She exhibits edema.  Neurological: She is alert.  Skin: Skin is warm and dry. No rash noted. No erythema.  Psychiatric: Her speech is normal and behavior is normal. She exhibits a depressed mood.    Labs reviewed:  Recent  Labs  01/28/17 0733 02/05/17 0700 03/12/17 0716  NA 140 139 139  K 3.8 4.0 3.8  CL 101 99* 100*  CO2 32 32 30  GLUCOSE 95 105* 95  BUN 19 23* 14  CREATININE 0.77 0.84 0.66  CALCIUM 8.7* 9.2 8.9    Recent Labs  05/08/16 0731 08/03/16 0730 02/05/17 0700  AST 32 27 25  ALT 27 39 18  ALKPHOS 73 111 58  BILITOT 0.5 0.8 0.7  PROT 7.0 7.8 7.6  ALBUMIN 3.4* 3.9 4.1    Recent Labs  05/08/16 0731  10/17/16 0500 01/04/17 0600 02/05/17 0700  WBC 6.0  < > 4.7 5.4 5.8  NEUTROABS 3.0  --  2.1  --  2.7  HGB 12.2  < > 12.7 12.5 14.3  HCT 37.9  < > 38.2 37.9 43.3  MCV 94.3  < > 92.3 93.1 93.3  PLT 169  < > 163 136* 158  < > = values in this interval not displayed. Lab Results  Component Value Date   TSH 1.279 01/04/2017   No results found for: HGBA1C Lab Results  Component Value Date   CHOL 117 01/04/2017   HDL 39 (L) 01/04/2017   LDLCALC 62 01/04/2017   TRIG 80 01/04/2017   CHOLHDL 3.0 01/04/2017    Significant Diagnostic Results in last 30 days:  No results found.  Assessment/Plan  Essential hypertension BP has been well controlled on HCTZ and Lovastatin Continue same dose. No change.  Osteoporosis, senile Patient os on Chronic Fosamax therapy. . It seems since 2014. Serum Calcium Normal. Repeat Dexa scan showed T score of -1.655 Continue Fosamax for another year and then she will get 5 years of therapy.  Recent Weight Gain BNP was normal. Most of her weight is due to Increase calorie intake. She is well aware not to eat snacks in b/w meals.  At high risk for falls Patient well aware to call for help when she needs to get up from her wheel chair.  Depression with anxiety  On Prozac recently to 30 mg. Slight Worsening recently. Follow up with Psych services.  Hyperlipemia Last LDL was 62 in 03/18 Continue lovastatin.   Seizure disorder  Stable on Levitiracetam  Chronic Bronchitis Better on Spiriva  Insomnia On Melatonin Diarrhea Will  hold colace  Family/ staff Communication:   Labs/tests ordered:    Total time spent in this patient care encounter was 25_ minutes; greater than 50% of the visit spent counseling patient and coordinating care for problems addressed at  this encounter.

## 2017-04-01 NOTE — Progress Notes (Signed)
This encounter was created in error - please disregard.

## 2017-04-04 ENCOUNTER — Encounter: Payer: Self-pay | Admitting: Internal Medicine

## 2017-04-04 ENCOUNTER — Non-Acute Institutional Stay (SKILLED_NURSING_FACILITY): Payer: Medicare Other | Admitting: Internal Medicine

## 2017-04-04 DIAGNOSIS — M810 Age-related osteoporosis without current pathological fracture: Secondary | ICD-10-CM | POA: Diagnosis not present

## 2017-04-04 DIAGNOSIS — G40909 Epilepsy, unspecified, not intractable, without status epilepticus: Secondary | ICD-10-CM | POA: Diagnosis not present

## 2017-04-04 DIAGNOSIS — I10 Essential (primary) hypertension: Secondary | ICD-10-CM | POA: Diagnosis not present

## 2017-04-04 DIAGNOSIS — F418 Other specified anxiety disorders: Secondary | ICD-10-CM

## 2017-04-04 DIAGNOSIS — E785 Hyperlipidemia, unspecified: Secondary | ICD-10-CM

## 2017-04-04 NOTE — Progress Notes (Signed)
Location:   [facility] Room Number: 145/D Place of Service:  SNF (31) Provider:  Odessa Fleming, MD  Patient Care Team: Kerri Perches, MD as PCP - General [place] Bali, MD (Gastroenterology)  Extended Emergency Contact Information Primary Emergency Contact: Catanese,Alfreda Address: [address]          [name], [place] [address] [place] Home Phone: [phone] Mobile Phone: [phone] Relation: Daughter Secondary Emergency Contact: Vivien Presto [place] Mobile Phone: [phone] Relation: Son  Code Status:  Full Code Goals of care: Advanced Directive information Advanced Directives 04/04/2017  Does Patient Have a Medical Advance Directive? Yes  Type of Advance Directive (No Data)  Does patient want to make changes to medical advance directive? No - Patient declined  Copy of Healthcare Power of Attorney in Chart? -  Would patient like information on creating a medical advance directive? No - Patient declined  Pre-existing out of facility DNR order (yellow form or pink MOST form) -     Chief Complaint  Patient presents with  . Medical Management of Chronic Issues    Routine Visit  Medical management of chronic medical conditions including hypertension-osteoporosis-seizure disorder-history of bronchitis- as well as osteoporosis--also depression  HPI:  Pt is a 83 y.o. female seen today for medical management of chronic diseases. As noted above-she appears to be relatively stable she has had some weight gain which appears to have stabilized about 265 pounds apparently she is eating well Dr. Chales Abrahams and I have discussed this with her and apparently she is trying to cut back on snacking.  She does not complaining of any increased shortness of breath or decreased energy her BNP was 44 earlier this month.  She does have a history of hypertension blood pressure appears to be relatively  stable on hydrochlorothiazide I got 124/76 this evening.  She also has a history of depression currently on Paxil-this appears to have stabilized she appeared to be given spirits this evening nursing staff does not report any recent significant episodes but this will have to be watched.  She also has a history seizure disorder whichhas been stable for a considerable amount of time he is on Keppra.  Regards to respiratory issues has a history of somewhat chronic bronchitis this is been stable on Spiriva she also has when necessary nebulizers.  Currently she is in her wheelchair sitting comfortably does not really have any complaints.  Time she has complained of right shoulder pain x-rays have not shown any acute process other than arthritis related changes-she does receive Tylenol arthritis-once a day as well as Vicodin as needed and apparently this is giving her relief at this point.  She does have a history of osteoporosis and continues on calcium as well as Fosamax recommendation to continue Fosamax for one more year per Dr. Urban Gibson assessment       Past Medical History:  Diagnosis Date  . Diverticulosis OCT 2010 TCS   SIGMOID COLON  . Esophageal web 2010   DILATION 16 MM  . Gastritis, Helicobacter pylori 2010   Rx. ABO for 10 days   . GERD (gastroesophageal reflux disease)   . Hip fracture, left (HCC)    2016  . Hyperlipidemia   . Hypertension   . Restless legs syndrome   . Seizures [facility]) January 2010   Post head trauma in fall .sees Dr Gerilyn Pilgrim   Past Surgical History:  Procedure Laterality Date  . ABDOMINAL  HYSTERECTOMY    . COLONOSCOPY  OCT 2010   TORTUOUS, Sml IH, Tics, MULTIPLE SIMPLE ADENOMAS (<6MM)  . HIP ARTHROPLASTY Left 11/19/2014   Procedure: LEFT PARTIAL HIP REPLACEMENT;  Surgeon: Vickki Hearing, MD;  Location: [facility];  Service: Orthopedics;  Laterality: Left;  . ORIF HIP FRACTURE  04/05/2012   Procedure: OPEN REDUCTION INTERNAL FIXATION HIP;  Surgeon:  Vickki Hearing, MD;  Location: [facility];  Service: Orthopedics;  Laterality: Right;  . UPPER GASTROINTESTINAL ENDOSCOPY  AUG 2010   SAVARY  . VESICOVAGINAL FISTULA CLOSURE W/ TAH  1966   bleeding     No Known Allergies  Outpatient Encounter Prescriptions as of 04/04/2017  Medication Sig  . acetaminophen (TYLENOL) 650 MG CR tablet Take 1,300 mg by mouth daily.  Marland Kitchen alendronate (FOSAMAX) 70 MG tablet Take with full glass of water once a day on Monday  . ammonium lactate (LAC-HYDRIN) 12 % lotion Apply to right knee once a day  . aspirin 81 MG tablet Take 81 mg by mouth daily.  . calcium-vitamin D (OSCAL WITH D) 500-200 MG-UNIT TABS TAKE (1) TABLET BY MOUTH (3) TIMES DAILY WITH MEALS.  Marland Kitchen docusate sodium (COLACE) 100 MG capsule Take 200 mg by mouth daily.  Marland Kitchen FLUoxetine (PROZAC) 10 MG tablet Take 1 tablet 10 mg by mouth along with 20 mg to equal 30 mg once a day  . FLUoxetine (PROZAC) 20 MG tablet Take 1 tablet 20 mg along with 10 mg to equal 30 mg by mouth once a day  . hydrochlorothiazide (HYDRODIURIL) 12.5 MG tablet Take 12.5 mg by mouth daily.  Marland Kitchen HYDROcodone-acetaminophen (NORCO/VICODIN) 5-325 MG tablet Take 1 tablet by mouth every 6 (six) hours as needed for moderate pain.  Marland Kitchen ipratropium-albuterol (DUONEB) 0.5-2.5 (3) MG/3ML SOLN Give 1 vial inhalation twice a day for wheezing or SOB  . levETIRAcetam (KEPPRA) 500 MG tablet TAKE 1 TABLET BY MOUTH TWICE A DAY AS DIRECTED.  Marland Kitchen lovastatin (MEVACOR) 20 MG tablet Take 1 tablet (20 mg total) by mouth at bedtime.  . Melatonin 3 MG TABS Take 3 mg by mouth at bedtime.  . Multiple Vitamin (MULTIVITAMIN) tablet Take 1 tablet by mouth daily.  . potassium chloride (K-DUR,KLOR-CON) 10 MEQ tablet Take 10 mEq by mouth daily.  Marland Kitchen tiotropium (SPIRIVA) 18 MCG inhalation capsule Give 2 puffs once a day  . ZINC OXIDE EX Apply topically. Apply zinc oxide every shift prn apply to left and right sides under the breast where the bra rest  . [DISCONTINUED]  HYDROcodone-acetaminophen (NORCO/VICODIN) 5-325 MG tablet Take one tablet by mouth every 6 hours as needed for moderate pain or two tablets by mouth every 6 hours as needed for severe pain. Max APAP 3gm/24 hours from all sources  . [DISCONTINUED] Menthol, Topical Analgesic, (BIOFREEZE) 4 % GEL Apply to right shoulder twice a day  . [DISCONTINUED] polyethylene glycol (MIRALAX / GLYCOLAX) packet Take 17 g by mouth daily. Hold for diarrhea   No facility-administered encounter medications on file as of 04/04/2017.      Review of Systems  General she does not complaining fever or chills has some weight gain although it does not appear precipitous the last couple months.--This appears stabilized  Skin does not complain of rashes or itching.   Head ears eyes nose mouth and throat does not complain of visual changes has prescription lenses does not complain of sore throat.  Respiratory denies shortness of breath does have a cough occasionally says it's not greater than baseline  Cardiac does not complain of  chest pain has chronic lower extremity edema venous stasis changes.  GI is not complaining of any nausea vomiting diarrhea constipation or abdominal discomfort.  GU denies dysuria.  Muscle skeletal has history of osteoarthritis of the right shoulder but does not really complain of shoulder pain this evening .  Neurologic is not complaining of dizziness headache at this time.  Pysch--does have a history of mild dementia depression that appears to be stable--he is concerned that she may be more depressed today nursing facility but does not give that presentation this evening    Immunization History  Administered Date(s) Administered  . Influenza Split 07/25/2012  . Influenza,inj,Quad PF,36+ Mos 07/30/2013, 08/18/2014  . Influenza-Unspecified 07/06/2016  . PPD Test 12/06/2014  . Pneumococcal Conjugate-13 04/29/2014  . Pneumococcal Polysaccharide-23 07/25/2012  . Tdap  08/08/2011   Pertinent  Health Maintenance Due  Topic Date Due  . INFLUENZA VACCINE  05/08/2017  . DEXA SCAN  Completed  . PNA vac Low Risk Adult  Completed   Fall Risk  04/29/2014 07/30/2013  Falls in the past year? Yes Yes  Number falls in past yr: 1 2 or more  Injury with Fall? No -  Risk for fall due to : - Impaired balance/gait;Impaired mobility;History of fall(s)   Functional Status Survey:    She is afebrile pulse is 68 respirations 17 blood pressure 124/76  (manual) weight is 265.5 appears to have some recent variability between around 260-265  Physical Exam In general this is a pleasant elderly female in no distress sitting comfortably in her wheelchair.  Her skin is warm and dry.    Eyes she does have prescription lenses visual acuity appears to be at baseline.  Oropharynx is clear mucous membranes moist.  Chest  Clear to auscultation there is no labored breathing somewhat shallow air entry  Heart is regular rate and rhythm without murmur gallop or rub she has baseline lower extremity edema --this does not appear increasing from baseline she has compression hose on bilaterally.  Abdomen is obese soft nontender positive bowel sounds.  Musculoskeletal continues to ambulate in  wheelchair moves all extremities 4 with limited range of motion of her right shoulder  she has chronic lower extremity weakness--did not really complain of pain today when she raised her right arm.  Neurologic is grossly intact her speech is clear no lateralizing findings.  Psych she is pleasant and appropriate with some mild cognitive deficits but does well with supportive care she is pleasant and appropriate Labs reviewed:  Recent Labs  01/28/17 0733 02/05/17 0700 03/12/17 0716  NA 140 139 139  K 3.8 4.0 3.8  CL 101 99* 100*  CO2 32 32 30  GLUCOSE 95 105* 95  BUN 19 23* 14  CREATININE 0.77 0.84 0.66  CALCIUM 8.7* 9.2 8.9    Recent Labs  05/08/16 0731 08/03/16 0730  02/05/17 0700  AST 32 27 25  ALT 27 39 18  ALKPHOS 73 111 58  BILITOT 0.5 0.8 0.7  PROT 7.0 7.8 7.6  ALBUMIN 3.4* 3.9 4.1    Recent Labs  05/08/16 0731  10/17/16 0500 01/04/17 0600 02/05/17 0700  WBC 6.0  < > 4.7 5.4 5.8  NEUTROABS 3.0  --  2.1  --  2.7  HGB 12.2  < > 12.7 12.5 14.3  HCT 37.9  < > 38.2 37.9 43.3  MCV 94.3  < > 92.3 93.1 93.3  PLT 169  < > 163 136* 158  < > =  values in this interval not displayed. Lab Results  Component Value Date   TSH 1.279 01/04/2017   No results found for: HGBA1C Lab Results  Component Value Date   CHOL 117 01/04/2017   HDL 39 (L) 01/04/2017   LDLCALC 62 01/04/2017   TRIG 80 01/04/2017   CHOLHDL 3.0 01/04/2017    Significant Diagnostic Results in last 30 days:  No results found.  Assessment/Plan  1 hypertension this appears stable on hydrochlorothiazide and potassium supplementation Will update a BMP next week to ensure stability last metabolic panel earlier this month appear to be relatively unremarkable.  #2 osteoporosis she is on Fosamax every weekly also on calcium supplementation recommendation per Dr. continue Fosamax for 1 additional year.  #3 weight gain this appears lately to have stabilized she does continue it appears to have a good appetite her BNP was within normal range earlier this month.  #4 depression-at this point appears relatively stable Prozac there was concern several weeks ago that it had possibly increase but she appears to be doing well at this point with supportive care and on the Prozac but this will have to be monitored.  #5-history seizure disorder this is been stable for a considerable monotype she is on Keppra.  #6 history of bronchitis this is been stable as well she is on Spiriva also has as needed nebulizers.  #7 history of insomnia she is been started on melatonin apparently this is having some benefit.  #8 hyperlipidemia she is on lovastatin this appears stable with an LDL of 62 on lab  done in late March 2018  #9 history of arthritise specially right shoulder she appears to be doing well with Tylenol arthritis as well as when necessary Vicodin  [id number]:

## 2017-04-09 ENCOUNTER — Other Ambulatory Visit (HOSPITAL_COMMUNITY)
Admission: RE | Admit: 2017-04-09 | Discharge: 2017-04-09 | Disposition: A | Discharge status: Home / Self Care | Payer: Medicare Other | Source: Skilled Nursing Facility | Attending: Internal Medicine | Admitting: Internal Medicine

## 2017-04-09 DIAGNOSIS — I10 Essential (primary) hypertension: Secondary | ICD-10-CM | POA: Diagnosis present

## 2017-04-09 LAB — BASIC METABOLIC PANEL
ANION GAP: 7 (ref 5–15)
BUN: 22 mg/dL — ABNORMAL HIGH (ref 6–20)
CALCIUM: 8.4 mg/dL — AB (ref 8.9–10.3)
CO2: 32 mmol/L (ref 22–32)
Chloride: 101 mmol/L (ref 101–111)
Creatinine, Ser: 0.83 mg/dL (ref 0.44–1.00)
GLUCOSE: 85 mg/dL (ref 65–99)
POTASSIUM: 3.5 mmol/L (ref 3.5–5.1)
SODIUM: 140 mmol/L (ref 135–145)

## 2017-04-12 ENCOUNTER — Non-Acute Institutional Stay (SKILLED_NURSING_FACILITY): Payer: Medicare Other | Admitting: Internal Medicine

## 2017-04-12 DIAGNOSIS — L0292 Furuncle, unspecified: Secondary | ICD-10-CM

## 2017-04-14 NOTE — Progress Notes (Signed)
This is an acute visit.  Level care skilled.  Facility is [facility].  Chief complaint-acute visit follow-up boil on her upper right thigh.  History of present illness.  Patient stop in the hallway this afternoon and was concerned about an area on her right thigh area-I did assess it and it was a boil-like area I do not see any surrounding erythema or drainage there is no pain with palpation of the area this appears to be a single fluid-filled boil blister area I do not see any other lesions in the area  Past Medical History:  Diagnosis Date  . Diverticulosis OCT 2010 TCS   SIGMOID COLON  . Esophageal web 2010   DILATION 16 MM  . Gastritis, Helicobacter pylori 2010   Rx. ABO for 10 days   . GERD (gastroesophageal reflux disease)   . Hip fracture, left (HCC)    2016  . Hyperlipidemia   . Hypertension   . Restless legs syndrome   . Seizures [facility]) January 2010   Post head trauma in fall .sees Dr Gerilyn Pilgrim   Past Surgical History:  Procedure Laterality Date  . ABDOMINAL HYSTERECTOMY    . COLONOSCOPY  OCT 2010   TORTUOUS, Sml IH, Tics, MULTIPLE SIMPLE ADENOMAS (<6MM)  . HIP ARTHROPLASTY Left 11/19/2014   Procedure: LEFT PARTIAL HIP REPLACEMENT;  Surgeon: Vickki Hearing, MD;  Location: [facility];  Service: Orthopedics;  Laterality: Left;  . ORIF HIP FRACTURE  04/05/2012   Procedure: OPEN REDUCTION INTERNAL FIXATION HIP;  Surgeon: Vickki Hearing, MD;  Location: [facility];  Service: Orthopedics;  Laterality: Right;  . UPPER GASTROINTESTINAL ENDOSCOPY  AUG 2010   SAVARY  . VESICOVAGINAL FISTULA CLOSURE W/ TAH  1966   bleeding     No Known Allergies      Outpatient Encounter Prescriptions as of 04/04/2017  Medication Sig  . acetaminophen (TYLENOL) 650 MG CR tablet Take 1,300 mg by mouth daily.  Marland Kitchen alendronate (FOSAMAX) 70 MG tablet Take with full glass of water once a day on Monday  . ammonium lactate (LAC-HYDRIN) 12 % lotion Apply to right knee  once a day  . aspirin 81 MG tablet Take 81 mg by mouth daily.  . calcium-vitamin D (OSCAL WITH D) 500-200 MG-UNIT TABS TAKE (1) TABLET BY MOUTH (3) TIMES DAILY WITH MEALS.  Marland Kitchen docusate sodium (COLACE) 100 MG capsule Take 200 mg by mouth daily.  Marland Kitchen FLUoxetine (PROZAC) 10 MG tablet Take 1 tablet 10 mg by mouth along with 20 mg to equal 30 mg once a day  . FLUoxetine (PROZAC) 20 MG tablet Take 1 tablet 20 mg along with 10 mg to equal 30 mg by mouth once a day  . hydrochlorothiazide (HYDRODIURIL) 12.5 MG tablet Take 12.5 mg by mouth daily.  Marland Kitchen HYDROcodone-acetaminophen (NORCO/VICODIN) 5-325 MG tablet Take 1 tablet by mouth every 6 (six) hours as needed for moderate pain.  Marland Kitchen ipratropium-albuterol (DUONEB) 0.5-2.5 (3) MG/3ML SOLN Give 1 vial inhalation twice a day for wheezing or SOB  . levETIRAcetam (KEPPRA) 500 MG tablet TAKE 1 TABLET BY MOUTH TWICE A DAY AS DIRECTED.  Marland Kitchen lovastatin (MEVACOR) 20 MG tablet Take 1 tablet (20 mg total) by mouth at bedtime.  . Melatonin 3 MG TABS Take 3 mg by mouth at bedtime.  . Multiple Vitamin (MULTIVITAMIN) tablet Take 1 tablet by mouth daily.  . potassium chloride (K-DUR,KLOR-CON) 10 MEQ tablet Take 10 mEq by mouth daily.  Marland Kitchen tiotropium (SPIRIVA) 18 MCG inhalation capsule Give 2  puffs once a day  . ZINC OXIDE EX Apply topically. Apply zinc oxide every shift prn apply to left and right sides under the breast where the bra rest  . [DISCONTINUED] HYDROcodone-acetaminophen (NORCO/VICODIN) 5-325 MG tablet Take one tablet by mouth every 6 hours as needed for moderate pain or two tablets by mouth every 6 hours as needed for severe pain. Max APAP 3gm/24 hours from all sources  . [DISCONTINUED] Menthol, Topical Analgesic, (BIOFREEZE) 4 % GEL Apply to right shoulder twice a day  . [DISCONTINUED] polyethylene glycol (MIRALAX / GLYCOLAX) packet Take 17 g by mouth daily. Hold for diarrhea    Review of systems-as noted above he does not really complain of any pain with this  boil.  Does not really complain of any recent trauma although it appears possibly her compression hose may have been in the area.  Physical exam.  Right upper thigh medial aspect there is a small appears to be less than 1 cm blister boil fluid-filled lesion-I do not see any surrounding erythema drainage there is no firmness around the area or tenderness to palpation at this point appears benign     Assessment and plan.  Blister boil-this appears to be an isolated boil at this point will monitor I told her it starts hurting or if there are more blisters to let us know-she expressed understanding.  I cannot rule out contact with compression holes by do not really see signs of irritation or infection at this time  This will have to be monitored for changes sign of infection or progression  [id number]

## 2017-04-15 ENCOUNTER — Encounter (HOSPITAL_COMMUNITY)
Admission: RE | Admit: 2017-04-15 | Discharge: 2017-04-15 | Disposition: A | Discharge status: Home / Self Care | Payer: Medicare Other | Source: Skilled Nursing Facility | Attending: Internal Medicine | Admitting: Internal Medicine

## 2017-04-15 DIAGNOSIS — I10 Essential (primary) hypertension: Secondary | ICD-10-CM | POA: Diagnosis not present

## 2017-04-15 LAB — BASIC METABOLIC PANEL
ANION GAP: 9 (ref 5–15)
BUN: 20 mg/dL (ref 6–20)
CO2: 33 mmol/L — AB (ref 22–32)
Calcium: 8.5 mg/dL — ABNORMAL LOW (ref 8.9–10.3)
Chloride: 99 mmol/L — ABNORMAL LOW (ref 101–111)
Creatinine, Ser: 0.75 mg/dL (ref 0.44–1.00)
GFR calc Af Amer: >60 mL/min (ref >60)
GFR calc non Af Amer: >60 mL/min (ref >60)
Glucose, Bld: 95 mg/dL (ref 65–99)
Potassium: 3.8 mmol/L (ref 3.5–5.1)
Sodium: 141 mmol/L (ref 135–145)

## 2017-06-03 ENCOUNTER — Encounter: Payer: Self-pay | Admitting: Internal Medicine

## 2017-06-03 ENCOUNTER — Non-Acute Institutional Stay (SKILLED_NURSING_FACILITY): Payer: Medicare Other | Admitting: Internal Medicine

## 2017-06-03 DIAGNOSIS — H00012 Hordeolum externum right lower eyelid: Secondary | ICD-10-CM

## 2017-06-03 NOTE — Progress Notes (Signed)
Location:    [facility] Room Number: 145/D Place of Service:  SNF [phone]) Provider:  Odessa Fleming, MD  Patient Care Team: Kerri Perches, MD as PCP - General [place] Bali, MD (Gastroenterology)  Extended Emergency Contact Information Primary Emergency Contact: [facility] Address: [address]          [name], [place] [address] [place] Home Phone: [phone] Mobile Phone: [phone] Relation: Daughter Secondary Emergency Contact: Vivien Presto [place] of [place] Mobile Phone: [phone] Relation: Son  Code Status:  Full Code Goals of care: Advanced Directive information Advanced Directives 06/03/2017  Does Patient Have a Medical Advance Directive? Yes  Type of Advance Directive (No Data)  Does patient want to make changes to medical advance directive? No - Patient declined  Copy of Healthcare Power of Attorney in Chart? -  Would patient like information on creating a medical advance directive? No - Patient declined  Pre-existing out of facility DNR order (yellow form or pink MOST form) -     Chief Complaint  Patient presents with  . Acute Visit    Stye in right eye    HPI:  Pt is a 83 y.o. female seen today for an acute visit for A suspected stye on her right lower eyelid.  I was called about this this weekend apparently she had this developed over the weekend with some swelling of her lower eyelid on the right-and possibly some mild erythema-when I was called about this I empirically started her on doxycycline for concerns of cellulitis.  On evaluation today however she does not really appear to have any residual erythema in the orbital l area she does appear to have a stye on the lower right eyelid with some elevation minimal tenderness she denies any visual changes or eye discomfort.  Visual acuity appears intact.  She is afebrile vital signs are stable   Past Medical  History:  Diagnosis Date  . Diverticulosis OCT 2010 TCS   SIGMOID COLON  . Esophageal web 2010   DILATION 16 MM  . Gastritis, Helicobacter pylori 2010   Rx. ABO for 10 days   . GERD (gastroesophageal reflux disease)   . Hip fracture, left (HCC)    2016  . Hyperlipidemia   . Hypertension   . Restless legs syndrome   . Seizures [facility]) January 2010   Post head trauma in fall .sees Dr Gerilyn Pilgrim   Past Surgical History:  Procedure Laterality Date  . ABDOMINAL HYSTERECTOMY    . COLONOSCOPY  OCT 2010   TORTUOUS, Sml IH, Tics, MULTIPLE SIMPLE ADENOMAS (<6MM)  . HIP ARTHROPLASTY Left 11/19/2014   Procedure: LEFT PARTIAL HIP REPLACEMENT;  Surgeon: Vickki Hearing, MD;  Location: [facility];  Service: Orthopedics;  Laterality: Left;  . ORIF HIP FRACTURE  04/05/2012   Procedure: OPEN REDUCTION INTERNAL FIXATION HIP;  Surgeon: Vickki Hearing, MD;  Location: [facility];  Service: Orthopedics;  Laterality: Right;  . UPPER GASTROINTESTINAL ENDOSCOPY  AUG 2010   SAVARY  . VESICOVAGINAL FISTULA CLOSURE W/ TAH  1966   bleeding     No Known Allergies  Outpatient Encounter Prescriptions as of 06/03/2017  Medication Sig  . acetaminophen (TYLENOL) 650 MG CR tablet Take 1,300 mg by mouth daily.  Marland Kitchen alendronate (FOSAMAX) 70 MG tablet Take with full glass of water once a day on Monday  . ammonium lactate (LAC-HYDRIN) 12 % lotion Apply to right knee once a day  .  aspirin 81 MG tablet Take 81 mg by mouth daily.  . calcium-vitamin D (OSCAL WITH D) 500-200 MG-UNIT TABS TAKE (1) TABLET BY MOUTH (3) TIMES DAILY WITH MEALS.  Marland Kitchen docusate sodium (COLACE) 100 MG capsule Take 200 mg by mouth 2 (two) times daily.   Marland Kitchen FLUoxetine (PROZAC) 10 MG tablet Take 1 tablet 10 mg by mouth along with 20 mg to equal 30 mg once a day  . FLUoxetine (PROZAC) 20 MG tablet Take 1 tablet 20 mg along with 10 mg to equal 30 mg by mouth once a day  . hydrochlorothiazide (HYDRODIURIL) 12.5 MG tablet Take 12.5 mg by mouth daily.  Marland Kitchen  HYDROcodone-acetaminophen (NORCO/VICODIN) 5-325 MG tablet Take 1 tablet by mouth every 6 (six) hours as needed for moderate pain.  Marland Kitchen ipratropium-albuterol (DUONEB) 0.5-2.5 (3) MG/3ML SOLN Give 1 vial inhalation twice a day for wheezing or SOB  . levETIRAcetam (KEPPRA) 500 MG tablet TAKE 1 TABLET BY MOUTH TWICE A DAY AS DIRECTED.  Marland Kitchen lovastatin (MEVACOR) 20 MG tablet Take 1 tablet (20 mg total) by mouth at bedtime.  . Melatonin 3 MG TABS Take 3 mg by mouth at bedtime.  . Multiple Vitamin (MULTIVITAMIN) tablet Take 1 tablet by mouth daily.  . potassium chloride (K-DUR,KLOR-CON) 10 MEQ tablet Take 10 mEq by mouth daily.  . Probiotic Product (RISA-BID PROBIOTIC) TABS Take 1 tablet by mouth twice daily for 10 days stop on 06/11/2017  . tiotropium (SPIRIVA) 18 MCG inhalation capsule Give 2 puffs once a day  . ZINC OXIDE EX Apply topically. Apply zinc oxide every shift prn apply to left and right sides under the breast where the bra rest   No facility-administered encounter medications on file as of 06/03/2017.     Review of Systems   General does not complaining of any fever or chills.  Skin does not complain rashes or itching.  Eyes again is not complaining of any visual changes or eye irritation or pain.  Respiratory does not complaining of any shortness of breath.  Throat is not complaining of any difficulty swallowing or sore throat  Immunization History  Administered Date(s) Administered  . Influenza Split 07/25/2012  . Influenza,inj,Quad PF,6+ Mos 07/30/2013, 08/18/2014  . Influenza-Unspecified 07/06/2016  . PPD Test 12/06/2014  . Pneumococcal Conjugate-13 04/29/2014  . Pneumococcal Polysaccharide-23 07/25/2012  . Tdap 08/08/2011   Pertinent  Health Maintenance Due  Topic Date Due  . INFLUENZA VACCINE  09/07/2017 (Originally 05/08/2017)  . DEXA SCAN  Completed  . PNA vac Low Risk Adult  Completed   Fall Risk  04/29/2014 07/30/2013  Falls in the past year? Yes Yes  Number falls in  past yr: 1 2 or more  Injury with Fall? No -  Risk for fall due to : - Impaired balance/gait;Impaired mobility;History of fall(s)   Functional Status Survey:     Temperature is 97.5 pulse 70 respirations 20 blood pressure 135/77 O2 saturation is 92% on room air  Physical Exam   In general this is a well-nourished elderly female in no distress sitting comfortably in her wheelchair.  Her skin is warm and dry.  Eyes sclera and conjunctiva are clear bilaterally I do note her right lower eyelid there is a area of elevation and slight erythema and slight tenderness to palpation this appears to be a stye I do not note any Peri-orbital erythema or concerns at this time.  Extraocular movements are intact pupils are reactive to light bilaterally visual acuity appears grossly intact   Muscle  skeletal continues to move all her extremities at baseline ambulates in a wheelchair and a call again he.  Neurologic is intact her speech is clear cranial nerves appear to be intact.    Labs reviewed:  Recent Labs  03/12/17 0716 04/09/17 0500 04/15/17 0900  NA 139 140 141  K 3.8 3.5 3.8  CL 100* 101 99*  CO2 30 32 33*  GLUCOSE 95 85 95  BUN 14 22* 20  CREATININE 0.66 0.83 0.75  CALCIUM 8.9 8.4* 8.5*    Recent Labs  08/03/16 0730 02/05/17 0700  AST 27 25  ALT 39 18  ALKPHOS 111 58  BILITOT 0.8 0.7  PROT 7.8 7.6  ALBUMIN 3.9 4.1    Recent Labs  10/17/16 0500 01/04/17 0600 02/05/17 0700  WBC 4.7 5.4 5.8  NEUTROABS 2.1  --  2.7  HGB 12.7 12.5 14.3  HCT 38.2 37.9 43.3  MCV 92.3 93.1 93.3  PLT 163 136* 158   Lab Results  Component Value Date   TSH 1.279 01/04/2017   No results found for: HGBA1C Lab Results  Component Value Date   CHOL 117 01/04/2017   HDL 39 (L) 01/04/2017   LDLCALC 62 01/04/2017   TRIG 80 01/04/2017   CHOLHDL 3.0 01/04/2017    Significant Diagnostic Results in last 30 days:  No results found.  Assessment/Plan  Suspect his stye right lower  eyelid we'll DC the doxycycline I do not really see signs of infection at this point will order warm compresses 3 times a day every shift to area until h resolved continue to monitor  Vital signs are stable she is afebrile does not complaining any eye pain or visual changes but this will have to be watched.--Monitor for any increased erythema in the area size of the stye or visual changes  [id number]

## 2017-06-11 ENCOUNTER — Encounter: Payer: Self-pay | Admitting: Internal Medicine

## 2017-06-11 NOTE — Progress Notes (Signed)
Location:   [facility] Room Number: 145/D Place of Service:  SNF (31) Provider:  Valrie Hart, MD  Patient Care Team: Kerri Perches, MD as PCP - General [place] Bali, MD (Gastroenterology)  Extended Emergency Contact Information Primary Emergency Contact: Licea,Alfreda Address: [address]          [name], [place] [address] [place] Home Phone: [phone] Mobile Phone: [phone] Relation: Daughter Secondary Emergency Contact: Vivien Presto [place] Mobile Phone: [phone] Relation: Son  Code Status:  Full Code Goals of care: Advanced Directive information Advanced Directives 06/11/2017  Does Patient Have a Medical Advance Directive? Yes  Type of Advance Directive (No Data)  Does patient want to make changes to medical advance directive? No - Patient declined  Copy of Healthcare Power of Attorney in Chart? -  Would patient like information on creating a medical advance directive? No - Patient declined  Pre-existing out of facility DNR order (yellow form or pink MOST form) -     Chief Complaint  Patient presents with  . Medical Management of Chronic Issues    Routine Visit    HPI:  Pt is a 83 y.o. female seen today for medical management of chronic diseases.     Past Medical History:  Diagnosis Date  . Diverticulosis OCT 2010 TCS   SIGMOID COLON  . Esophageal web 2010   DILATION 16 MM  . Gastritis, Helicobacter pylori 2010   Rx. ABO for 10 days   . GERD (gastroesophageal reflux disease)   . Hip fracture, left (HCC)    2016  . Hyperlipidemia   . Hypertension   . Restless legs syndrome   . Seizures [facility]) January 2010   Post head trauma in fall .sees Dr Gerilyn Pilgrim   Past Surgical History:  Procedure Laterality Date  . ABDOMINAL HYSTERECTOMY    . COLONOSCOPY  OCT 2010   TORTUOUS, Sml IH, Tics, MULTIPLE SIMPLE ADENOMAS (<6MM)  . HIP ARTHROPLASTY Left  11/19/2014   Procedure: LEFT PARTIAL HIP REPLACEMENT;  Surgeon: Vickki Hearing, MD;  Location: [facility];  Service: Orthopedics;  Laterality: Left;  . ORIF HIP FRACTURE  04/05/2012   Procedure: OPEN REDUCTION INTERNAL FIXATION HIP;  Surgeon: Vickki Hearing, MD;  Location: [facility];  Service: Orthopedics;  Laterality: Right;  . UPPER GASTROINTESTINAL ENDOSCOPY  AUG 2010   SAVARY  . VESICOVAGINAL FISTULA CLOSURE W/ TAH  1966   bleeding     No Known Allergies  Outpatient Encounter Prescriptions as of 06/11/2017  Medication Sig  . acetaminophen (TYLENOL) 650 MG CR tablet Take 1,300 mg by mouth daily.  Marland Kitchen alendronate (FOSAMAX) 70 MG tablet Take with full glass of water once a day on Monday  . ammonium lactate (LAC-HYDRIN) 12 % lotion Apply to right knee once a day  . aspirin 81 MG tablet Take 81 mg by mouth daily.  . calcium-vitamin D (OSCAL WITH D) 500-200 MG-UNIT TABS TAKE (1) TABLET BY MOUTH (3) TIMES DAILY WITH MEALS.  Marland Kitchen docusate sodium (COLACE) 100 MG capsule Take 200 mg by mouth 2 (two) times daily.   Marland Kitchen FLUoxetine (PROZAC) 10 MG tablet Take 1 tablet 10 mg by mouth along with 20 mg to equal 30 mg once a day  . FLUoxetine (PROZAC) 20 MG tablet Take 1 tablet 20 mg along with 10 mg to equal 30 mg by mouth once a day  . hydrochlorothiazide (HYDRODIURIL) 12.5 MG tablet Take 12.5 mg  by mouth daily.  Marland Kitchen. HYDROcodone-acetaminophen (NORCO/VICODIN) 5-325 MG tablet Take 1 tablet by mouth every 6 (six) hours as needed for moderate pain.  Marland Kitchen. ipratropium-albuterol (DUONEB) 0.5-2.5 (3) MG/3ML SOLN Give 1 vial inhalation twice a day for wheezing or SOB  . levETIRAcetam (KEPPRA) 500 MG tablet TAKE 1 TABLET BY MOUTH TWICE A DAY AS DIRECTED.  Marland Kitchen. lovastatin (MEVACOR) 20 MG tablet Take 1 tablet (20 mg total) by mouth at bedtime.  . Melatonin 3 MG TABS Take 3 mg by mouth at bedtime.  . Multiple Vitamin (MULTIVITAMIN) tablet Take 1 tablet by mouth daily.  . potassium chloride (K-DUR,KLOR-CON) 10 MEQ tablet Take 10  mEq by mouth daily.  Marland Kitchen. tiotropium (SPIRIVA) 18 MCG inhalation capsule Give 2 puffs once a day  . ZINC OXIDE EX Apply topically. Apply zinc oxide every shift prn apply to left and right sides under the breast where the bra rest  . [DISCONTINUED] Probiotic Product (RISA-BID PROBIOTIC) TABS Take 1 tablet by mouth twice daily for 10 days stop on 06/11/2017   No facility-administered encounter medications on file as of 06/11/2017.      Review of Systems  Immunization History  Administered Date(s) Administered  . Influenza Split 07/25/2012  . Influenza,inj,Quad PF,6+ Mos 07/30/2013, 08/18/2014  . Influenza-Unspecified 07/06/2016  . PPD Test 12/06/2014  . Pneumococcal Conjugate-13 04/29/2014  . Pneumococcal Polysaccharide-23 07/25/2012  . Tdap 08/08/2011   Pertinent  Health Maintenance Due  Topic Date Due  . INFLUENZA VACCINE  09/07/2017 (Originally 05/08/2017)  . DEXA SCAN  Completed  . PNA vac Low Risk Adult  Completed   Fall Risk  04/29/2014 07/30/2013  Falls in the past year? Yes Yes  Number falls in past yr: 1 2 or more  Injury with Fall? No -  Risk for fall due to : - Impaired balance/gait;Impaired mobility;History of fall(s)   Functional Status Survey:    Vitals:   06/11/17 1235  BP: (!) 142/87  Pulse: 84  Resp: 18  Temp: 98 F (36.7 C)  TempSrc: Oral  Weight: 264 lb (119.7 kg)  Height: 5\' 7"  (1.702 m)   Body mass index is 41.35 kg/m. Physical Exam  Labs reviewed:  Recent Labs  03/12/17 0716 04/09/17 0500 04/15/17 0900  NA 139 140 141  K 3.8 3.5 3.8  CL 100* 101 99*  CO2 30 32 33*  GLUCOSE 95 85 95  BUN 14 22* 20  CREATININE 0.66 0.83 0.75  CALCIUM 8.9 8.4* 8.5*    Recent Labs  08/03/16 0730 02/05/17 0700  AST 27 25  ALT 39 18  ALKPHOS 111 58  BILITOT 0.8 0.7  PROT 7.8 7.6  ALBUMIN 3.9 4.1    Recent Labs  10/17/16 0500 01/04/17 0600 02/05/17 0700  WBC 4.7 5.4 5.8  NEUTROABS 2.1  --  2.7  HGB 12.7 12.5 14.3  HCT 38.2 37.9 43.3  MCV 92.3  93.1 93.3  PLT 163 136* 158   Lab Results  Component Value Date   TSH 1.279 01/04/2017   No results found for: HGBA1C Lab Results  Component Value Date   CHOL 117 01/04/2017   HDL 39 (L) 01/04/2017   LDLCALC 62 01/04/2017   TRIG 80 01/04/2017   CHOLHDL 3.0 01/04/2017    Significant Diagnostic Results in last 30 days:  No results found.  Assessment/Plan There are no diagnoses linked to this encounter.   Family/ staff Communication:   Labs/tests ordered:       This encounter was created in error -  please disregard.  This encounter was created in error - please disregard.

## 2017-06-18 ENCOUNTER — Non-Acute Institutional Stay (SKILLED_NURSING_FACILITY): Payer: Medicare Other | Admitting: Internal Medicine

## 2017-06-18 ENCOUNTER — Encounter: Payer: Self-pay | Admitting: Internal Medicine

## 2017-06-18 DIAGNOSIS — G40909 Epilepsy, unspecified, not intractable, without status epilepticus: Secondary | ICD-10-CM

## 2017-06-18 DIAGNOSIS — M810 Age-related osteoporosis without current pathological fracture: Secondary | ICD-10-CM

## 2017-06-18 DIAGNOSIS — E785 Hyperlipidemia, unspecified: Secondary | ICD-10-CM

## 2017-06-18 DIAGNOSIS — I10 Essential (primary) hypertension: Secondary | ICD-10-CM | POA: Diagnosis not present

## 2017-06-18 DIAGNOSIS — F418 Other specified anxiety disorders: Secondary | ICD-10-CM | POA: Diagnosis not present

## 2017-06-18 NOTE — Progress Notes (Signed)
Location:   [facility] Room Number: 145/D Place of Service:  SNF (31) Provider:  Valrie HartAnjali,Chere Babson  Simpson, Margaret E, MD  Patient Care Team: Kerri PerchesSimpson, Margaret E, MD as PCP - [facility] [place] BaliFields, Sandi L, MD (Gastroenterology)  Extended Emergency Contact Information Primary Emergency Contact: Deere,Alfreda Address: [address]          [name], [place] [id number] [place] Home Phone: [phone] Mobile Phone: [phone] Relation: Daughter Secondary Emergency Contact: Vivien PrestoMiller,Alfred  [place] Mobile Phone: [phone] Relation: Son  Code Status:  Full Code Goals of care: Advanced Directive information Advanced Directives 06/18/2017  Does Patient Have a Medical Advance Directive? Yes  Type of Advance Directive (No Data)  Does patient want to make changes to medical advance directive? No - Patient declined  Copy of Healthcare Power of Attorney in Chart? -  Would patient like information on creating a medical advance directive? No - Patient declined  Pre-existing out of facility DNR order (yellow form or pink MOST form) -     Chief Complaint  Patient presents with  . Medical Management of Chronic Issues    Routine Visit    HPI:  Pt is a 83 y.o. female seen today for medical management of chronic diseases.    Patient has h/o Hypertension, Seizure disorder, Anemia, Chronic Venous stasis, and Arthritis, Chronic Bronchitis, Osteoporosis s/p Femoral neck fracture And depression  Patient was seen today for routine visit. Her Mood was better today. She continues to be on Prozac and followed by Pschy services. She is sleeping well on Melatonin.  She did c/o Constipation and wanted something for that. Denied any abdominal pain or nausea. Her appetite stays good.  Her weight is 262 lbs which is slightly less then last visit. She says she is watching what she eat and cutting back on her snacks.  Past Medical History:    Diagnosis Date  . Diverticulosis OCT 2010 TCS   SIGMOID COLON  . Esophageal web 2010   DILATION 16 MM  . Gastritis, Helicobacter pylori 2010   Rx. ABO for 10 days   . GERD (gastroesophageal reflux disease)   . Hip fracture, left (HCC)    2016  . Hyperlipidemia   . Hypertension   . Restless legs syndrome   . Seizures [facility](HCC) January 2010   Post head trauma in fall .sees Dr Gerilyn Pilgrimoonquah   Past Surgical History:  Procedure Laterality Date  . ABDOMINAL HYSTERECTOMY    . COLONOSCOPY  OCT 2010   TORTUOUS, Sml IH, Tics, MULTIPLE SIMPLE ADENOMAS (<6MM)  . HIP ARTHROPLASTY Left 11/19/2014   Procedure: LEFT PARTIAL HIP REPLACEMENT;  Surgeon: Vickki HearingStanley E Harrison, MD;  Location: [facility];  Service: Orthopedics;  Laterality: Left;  . ORIF HIP FRACTURE  04/05/2012   Procedure: OPEN REDUCTION INTERNAL FIXATION HIP;  Surgeon: Vickki HearingStanley E Harrison, MD;  Location: [facility];  Service: Orthopedics;  Laterality: Right;  . UPPER GASTROINTESTINAL ENDOSCOPY  AUG 2010   SAVARY  . VESICOVAGINAL FISTULA CLOSURE W/ TAH  1966   bleeding     No Known Allergies  Outpatient Encounter Prescriptions as of 06/18/2017  Medication Sig  . acetaminophen (TYLENOL) 650 MG CR tablet Take 1,300 mg by mouth daily.  Marland Kitchen. alendronate (FOSAMAX) 70 MG tablet Take with full glass of water once a day on Monday  . ammonium lactate (LAC-HYDRIN) 12 % lotion Apply to right knee once a day  . aspirin 81 MG tablet Take 81 mg by mouth  daily.  . calcium-vitamin D (OSCAL WITH D) 500-200 MG-UNIT TABS TAKE (1) TABLET BY MOUTH (3) TIMES DAILY WITH MEALS.  Marland Kitchen docusate sodium (COLACE) 100 MG capsule Take 200 mg by mouth 2 (two) times daily.   Marland Kitchen FLUoxetine (PROZAC) 10 MG tablet Take 1 tablet 10 mg by mouth along with 20 mg to equal 30 mg once a day  . FLUoxetine (PROZAC) 20 MG tablet Take 1 tablet 20 mg along with 10 mg to equal 30 mg by mouth once a day  . hydrochlorothiazide (HYDRODIURIL) 12.5 MG tablet Take 12.5 mg by mouth daily.  Marland Kitchen  HYDROcodone-acetaminophen (NORCO/VICODIN) 5-325 MG tablet Take 1 tablet by mouth every 6 (six) hours as needed for moderate pain.  Marland Kitchen ipratropium-albuterol (DUONEB) 0.5-2.5 (3) MG/3ML SOLN Give 1 vial inhalation twice a day for wheezing or SOB  . levETIRAcetam (KEPPRA) 500 MG tablet TAKE 1 TABLET BY MOUTH TWICE A DAY AS DIRECTED.  Marland Kitchen lovastatin (MEVACOR) 20 MG tablet Take 1 tablet (20 mg total) by mouth at bedtime.  . Melatonin 3 MG TABS Take 3 mg by mouth at bedtime.  . Multiple Vitamin (MULTIVITAMIN) tablet Take 1 tablet by mouth daily.  . potassium chloride (K-DUR,KLOR-CON) 10 MEQ tablet Take 10 mEq by mouth daily.  Marland Kitchen tiotropium (SPIRIVA) 18 MCG inhalation capsule Give 2 puffs once a day  . ZINC OXIDE EX Apply topically. Apply zinc oxide every shift prn apply to left and right sides under the breast where the bra rest   No facility-administered encounter medications on file as of 06/18/2017.      Review of Systems  Review of Systems  Constitutional: Negative for activity change, appetite change, chills, diaphoresis, fatigue and fever.  HENT: Negative for mouth sores, postnasal drip, rhinorrhea, sinus pain and sore throat.   Respiratory: Negative for apnea, cough, chest tightness, shortness of breath and wheezing.   Cardiovascular: Negative for chest pain, palpitations and leg swelling.  Gastrointestinal: Negative for abdominal distention, abdominal pain, positive for constipation, no  nausea and vomiting.  Genitourinary: Negative for dysuria and frequency.  Musculoskeletal: Negative for arthralgias, joint swelling and myalgias.  Skin: Negative for rash.  Neurological: Negative for dizziness, syncope, weakness, light-headedness and numbness.  Psychiatric/Behavioral: Negative for behavioral problems, confusion and sleep disturbance.     Immunization History  Administered Date(s) Administered  . Influenza Split 07/25/2012  . Influenza,inj,Quad PF,6+ Mos 07/30/2013, 08/18/2014  .  Influenza-Unspecified 07/06/2016  . PPD Test 12/06/2014  . Pneumococcal Conjugate-13 04/29/2014  . Pneumococcal Polysaccharide-23 07/25/2012  . Tdap 08/08/2011   Pertinent  Health Maintenance Due  Topic Date Due  . INFLUENZA VACCINE  09/07/2017 (Originally 05/08/2017)  . DEXA SCAN  Completed  . PNA vac Low Risk Adult  Completed   Fall Risk  04/29/2014 07/30/2013  Falls in the past year? Yes Yes  Number falls in past yr: 1 2 or more  Injury with Fall? No -  Risk for fall due to : - Impaired balance/gait;Impaired mobility;History of fall(s)   Functional Status Survey:    Vitals:   06/18/17 0945  BP: (!) 142/87  Pulse: 84  Resp: 18  Temp: 98 F (36.7 C)  TempSrc: Oral  Weight: 262 lb 12.8 oz (119.2 kg)  Height:  (1.702 m)   Body mass index is 41.16 kg/m. Physical Exam  Constitutional: She appears well-developed and well-nourished.  HENT:  Head: Normocephalic.  Mouth/Throat: Oropharynx is clear and moist.  Eyes: Pupils are equal, round, and reactive to light.  Neck: Neck  supple.  Cardiovascular: Normal rate and normal heart sounds.   Pulmonary/Chest: Effort normal and breath sounds normal. No respiratory distress. She has no wheezes. She has no rales.  Abdominal: Soft. Bowel sounds are normal. She exhibits no distension. There is no tenderness. There is no rebound.  Musculoskeletal:  Mild edema bilateral  Neurological: She is alert.  Skin: Skin is warm and dry. No erythema.  Psychiatric: She has a normal mood and affect. Her behavior is normal. Thought content normal.    Labs reviewed:  Recent Labs  03/12/17 0716 04/09/17 0500 04/15/17 0900  NA 139 140 141  K 3.8 3.5 3.8  CL 100* 101 99*  CO2 30 32 33*  GLUCOSE 95 85 95  BUN 14 22* 20  CREATININE 0.66 0.83 0.75  CALCIUM 8.9 8.4* 8.5*    Recent Labs  08/03/16 0730 02/05/17 0700  AST 27 25  ALT 39 18  ALKPHOS 111 58  BILITOT 0.8 0.7  PROT 7.8 7.6  ALBUMIN 3.9 4.1    Recent Labs   10/17/16 0500 01/04/17 0600 02/05/17 0700  WBC 4.7 5.4 5.8  NEUTROABS 2.1  --  2.7  HGB 12.7 12.5 14.3  HCT 38.2 37.9 43.3  MCV 92.3 93.1 93.3  PLT 163 136* 158   Lab Results  Component Value Date   TSH 1.279 01/04/2017   No results found for: HGBA1C Lab Results  Component Value Date   CHOL 117 01/04/2017   HDL 39 (L) 01/04/2017   LDLCALC 62 01/04/2017   TRIG 80 01/04/2017   CHOLHDL 3.0 01/04/2017    Significant Diagnostic Results in last 30 days:  No results found.  Assessment/Plan  Essential hypertension BP slightly elevated. Continue HCTZ   Seizure disorder (HCC) Stable on Levetiracetam  Osteoporosis Patient os on Chronic Fosamax therapy. . It seems since 2014. Serum Calcium Normal. Repeat Dexa scan showed T score of -1.655 Continue Fosamax for another year and then she will get 5 years of therapy.  Hyperlipidemia LDL 62 in 03/18 On Lovastatin Repeat Lipid profile  Depression with anxiety On Prozac Doing better recently with her mood  Constipation Start on Senna with Colace  Chronic Bronchitis Better on Spiriva  Insomnia On Melatonin  Family/ staff Communication:   Labs/tests ordered:  CMP, CBC, TSH, Fasting lipid

## 2017-06-19 ENCOUNTER — Encounter (HOSPITAL_COMMUNITY)
Admission: RE | Admit: 2017-06-19 | Discharge: 2017-06-19 | Disposition: A | Discharge status: Home / Self Care | Payer: Medicare Other | Source: Skilled Nursing Facility | Attending: Internal Medicine | Admitting: Internal Medicine

## 2017-06-19 DIAGNOSIS — F0390 Unspecified dementia without behavioral disturbance: Secondary | ICD-10-CM | POA: Diagnosis not present

## 2017-06-19 DIAGNOSIS — F3289 Other specified depressive episodes: Secondary | ICD-10-CM | POA: Insufficient documentation

## 2017-06-19 DIAGNOSIS — J42 Unspecified chronic bronchitis: Secondary | ICD-10-CM | POA: Diagnosis present

## 2017-06-19 DIAGNOSIS — R635 Abnormal weight gain: Secondary | ICD-10-CM | POA: Insufficient documentation

## 2017-06-19 DIAGNOSIS — F482 Pseudobulbar affect: Secondary | ICD-10-CM | POA: Insufficient documentation

## 2017-06-19 DIAGNOSIS — F429 Obsessive-compulsive disorder, unspecified: Secondary | ICD-10-CM | POA: Diagnosis present

## 2017-06-19 LAB — COMPREHENSIVE METABOLIC PANEL
ALK PHOS: 55 U/L (ref 38–126)
ALT: 18 U/L (ref 14–54)
AST: 22 U/L (ref 15–41)
Albumin: 3.6 g/dL (ref 3.5–5.0)
Anion gap: 7 (ref 5–15)
BUN: 16 mg/dL (ref 6–20)
CO2: 34 mmol/L — AB (ref 22–32)
Calcium: 8.8 mg/dL — ABNORMAL LOW (ref 8.9–10.3)
Chloride: 100 mmol/L — ABNORMAL LOW (ref 101–111)
Creatinine, Ser: 0.8 mg/dL (ref 0.44–1.00)
GFR calc Af Amer: >60 mL/min (ref >60)
GFR calc non Af Amer: >60 mL/min (ref >60)
GLUCOSE: 94 mg/dL (ref 65–99)
POTASSIUM: 4.4 mmol/L (ref 3.5–5.1)
SODIUM: 141 mmol/L (ref 135–145)
Total Bilirubin: 0.5 mg/dL (ref 0.3–1.2)
Total Protein: 6.8 g/dL (ref 6.5–8.1)

## 2017-06-19 LAB — CBC
HCT: 37.7 % (ref 36.0–46.0)
Hemoglobin: 12.6 g/dL (ref 12.0–15.0)
MCH: 31 pg (ref 26.0–34.0)
MCHC: 33.4 g/dL (ref 30.0–36.0)
MCV: 92.9 fL (ref 78.0–100.0)
Platelets: 165 10*3/uL (ref 150–400)
RBC: 4.06 MIL/uL (ref 3.87–5.11)
RDW: 14.2 % (ref 11.5–15.5)
WBC: 5.6 10*3/uL (ref 4.0–10.5)

## 2017-06-19 LAB — LIPID PANEL
CHOL/HDL RATIO: 2.8 ratio
Cholesterol: 121 mg/dL (ref 0–200)
HDL: 43 mg/dL (ref >40)
LDL CALC: 67 mg/dL (ref 0–99)
Triglycerides: 54 mg/dL (ref <150)
VLDL: 11 mg/dL (ref 0–40)

## 2017-06-19 LAB — TSH: TSH: 1.59 u[IU]/mL (ref 0.350–4.500)

## 2017-06-20 LAB — VITAMIN D 25 HYDROXY (VIT D DEFICIENCY, FRACTURES): Vit D, 25-Hydroxy: 31.1 ng/mL (ref 30.0–100.0)

## 2017-06-21 ENCOUNTER — Other Ambulatory Visit: Payer: Self-pay

## 2017-06-21 MED ORDER — HYDROCODONE-ACETAMINOPHEN 5-325 MG PO TABS: 1.0000 | ORAL_TABLET | Freq: Four times a day (QID) | ORAL | 0 refills | Status: DC | PRN

## 2017-06-21 NOTE — Telephone Encounter (Signed)
RX Fax for [facility]@ [phone]  

## 2017-07-04 ENCOUNTER — Non-Acute Institutional Stay (SKILLED_NURSING_FACILITY): Payer: Medicare Other

## 2017-07-04 DIAGNOSIS — Z0000 Encounter for general adult medical examination without abnormal findings: Secondary | ICD-10-CM | POA: Diagnosis not present

## 2017-07-04 NOTE — Patient Instructions (Signed)
Alejandra Cruz , Thank you for taking time to come for your Medicare Wellness Visit. I appreciate your ongoing commitment to your health goals. Please review the following plan we discussed and let me know if I can assist you in the future.   Screening recommendations/referrals: Colonoscopy excluded, pt is over age 81 Mammogram excluded, pt is over age75 Bone Density up to date Recommended yearly ophthalmology/optometry visit for glaucoma screening and checkup Recommended yearly dental visit for hygiene and checkup  Vaccinations: Influenza vaccine due Pneumococcal vaccine up to date Tdap vaccine up to date. Due 08/07/21 Shingles vaccine not in records    Advanced directives: Need a copy of health care power of attorney and living will for chart  Conditions/risks identified: None  Next appointment: Dr. Chales Abrahams makes rounds   Preventive Care 65 Years and Older, Female Preventive care refers to lifestyle choices and visits with your health care provider that can promote health and wellness. What does preventive care include?  A yearly physical exam. This is also called an annual well check.  Dental exams once or twice a year.  Routine eye exams. Ask your health care provider how often you should have your eyes checked.  Personal lifestyle choices, including:  Daily care of your teeth and gums.  Regular physical activity.  Eating a healthy diet.  Avoiding tobacco and drug use.  Limiting alcohol use.  Practicing safe sex.  Taking low-dose aspirin every day.  Taking vitamin and mineral supplements as recommended by your health care provider. What happens during an annual well check? The services and screenings done by your health care provider during your annual well check will depend on your age, overall health, lifestyle risk factors, and family history of disease. Counseling  Your health care provider may ask you questions about your:  Alcohol use.  Tobacco  use.  Drug use.  Emotional well-being.  Home and relationship well-being.  Sexual activity.  Eating habits.  History of falls.  Memory and ability to understand (cognition).  Work and work Astronomer.  Reproductive health. Screening  You may have the following tests or measurements:  Height, weight, and BMI.  Blood pressure.  Lipid and cholesterol levels. These may be checked every 5 years, or more frequently if you are over 28 years old.  Skin check.  Lung cancer screening. You may have this screening every year starting at age 52 if you have a 30-pack-year history of smoking and currently smoke or have quit within the past 15 years.  Fecal occult blood test (FOBT) of the stool. You may have this test every year starting at age 21.  Flexible sigmoidoscopy or colonoscopy. You may have a sigmoidoscopy every 5 years or a colonoscopy every 10 years starting at age 47.  Hepatitis C blood test.  Hepatitis B blood test.  Sexually transmitted disease (STD) testing.  Diabetes screening. This is done by checking your blood sugar (glucose) after you have not eaten for a while (fasting). You may have this done every 1-3 years.  Bone density scan. This is done to screen for osteoporosis. You may have this done starting at age 74.  Mammogram. This may be done every 1-2 years. Talk to your health care provider about how often you should have regular mammograms. Talk with your health care provider about your test results, treatment options, and if necessary, the need for more tests. Vaccines  Your health care provider may recommend certain vaccines, such as:  Influenza vaccine. This is recommended every year.  Tetanus, diphtheria, and acellular pertussis (Tdap, Td) vaccine. You may need a Td booster every 10 years.  Zoster vaccine. You may need this after age 52.  Pneumococcal 13-valent conjugate (PCV13) vaccine. One dose is recommended after age 26.  Pneumococcal  polysaccharide (PPSV23) vaccine. One dose is recommended after age 84. Talk to your health care provider about which screenings and vaccines you need and how often you need them. This information is not intended to replace advice given to you by your health care provider. Make sure you discuss any questions you have with your health care provider. Document Released: 10/21/2015 Document Revised: 06/13/2016 Document Reviewed: 07/26/2015 Elsevier Interactive Patient Education  2017 [place] Prevention in the Home Falls can cause injuries. They can happen to people of all ages. There are many things you can do to make your home safe and to help prevent falls. What can I do on the outside of my home?  Regularly fix the edges of walkways and driveways and fix any cracks.  Remove anything that might make you trip as you walk through a door, such as a raised step or threshold.  Trim any bushes or trees on the path to your home.  Use bright outdoor lighting.  Clear any walking paths of anything that might make someone trip, such as rocks or tools.  Regularly check to see if handrails are loose or broken. Make sure that both sides of any steps have handrails.  Any raised decks and porches should have guardrails on the edges.  Have any leaves, snow, or ice cleared regularly.  Use sand or salt on walking paths during winter.  Clean up any spills in your garage right away. This includes oil or grease spills. What can I do in the bathroom?  Use night lights.  Install grab bars by the toilet and in the tub and shower. Do not use towel bars as grab bars.  Use non-skid mats or decals in the tub or shower.  If you need to sit down in the shower, use a plastic, non-slip stool.  Keep the floor dry. Clean up any water that spills on the floor as soon as it happens.  Remove soap buildup in the tub or shower regularly.  Attach bath mats securely with double-sided non-slip rug  tape.  Do not have throw rugs and other things on the floor that can make you trip. What can I do in the bedroom?  Use night lights.  Make sure that you have a light by your bed that is easy to reach.  Do not use any sheets or blankets that are too big for your bed. They should not hang down onto the floor.  Have a firm chair that has side arms. You can use this for support while you get dressed.  Do not have throw rugs and other things on the floor that can make you trip. What can I do in the kitchen?  Clean up any spills right away.  Avoid walking on wet floors.  Keep items that you use a lot in easy-to-reach places.  If you need to reach something above you, use a strong step stool that has a grab bar.  Keep electrical cords out of the way.  Do not use floor polish or wax that makes floors slippery. If you must use wax, use non-skid floor wax.  Do not have throw rugs and other things on the floor that can make you trip. What can I do  with my stairs?  Do not leave any items on the stairs.  Make sure that there are handrails on both sides of the stairs and use them. Fix handrails that are broken or loose. Make sure that handrails are as long as the stairways.  Check any carpeting to make sure that it is firmly attached to the stairs. Fix any carpet that is loose or worn.  Avoid having throw rugs at the top or bottom of the stairs. If you do have throw rugs, attach them to the floor with carpet tape.  Make sure that you have a light switch at the top of the stairs and the bottom of the stairs. If you do not have them, ask someone to add them for you. What else can I do to help prevent falls?  Wear shoes that:  Do not have high heels.  Have rubber bottoms.  Are comfortable and fit you well.  Are closed at the toe. Do not wear sandals.  If you use a stepladder:  Make sure that it is fully opened. Do not climb a closed stepladder.  Make sure that both sides of the  stepladder are locked into place.  Ask someone to hold it for you, if possible.  Clearly mark and make sure that you can see:  Any grab bars or handrails.  First and last steps.  Where the edge of each step is.  Use tools that help you move around (mobility aids) if they are needed. These include:  Canes.  Walkers.  Scooters.  Crutches.  Turn on the lights when you go into a dark area. Replace any light bulbs as soon as they burn out.  Set up your furniture so you have a clear path. Avoid moving your furniture around.  If any of your floors are uneven, fix them.  If there are any pets around you, be aware of where they are.  Review your medicines with your doctor. Some medicines can make you feel dizzy. This can increase your chance of falling. Ask your doctor what other things that you can do to help prevent falls. This information is not intended to replace advice given to you by your health care provider. Make sure you discuss any questions you have with your health care provider. Document Released: 07/21/2009 Document Revised: 03/01/2016 Document Reviewed: 10/29/2014 Elsevier Interactive Patient Education  2017 [employer].

## 2017-07-04 NOTE — Progress Notes (Signed)
Subjective:   Alejandra Cruz is a 83 y.o. female who presents for Medicare Annual (Subsequent) preventive examination at [facility]  Last AWV-04/29/2014    Objective:     Vitals: BP (!) 158/70 (BP Location: Left Arm, Patient Position: Sitting)   Pulse 72   Temp 97.8 F (36.6 C) (Oral)   Ht  (1.702 m)   Wt 263 lb (119.3 kg)   SpO2 97%   BMI 41.19 kg/m   Body mass index is 41.19 kg/m.   Tobacco History  Smoking Status  . Never Smoker  Smokeless Tobacco  . Never Used     Counseling given: Not Answered   Past Medical History:  Diagnosis Date  . Diverticulosis OCT 2010 TCS   SIGMOID COLON  . Esophageal web 2010   DILATION 16 MM  . Gastritis, Helicobacter pylori 2010   Rx. ABO for 10 days   . GERD (gastroesophageal reflux disease)   . Hip fracture, left (HCC)    2016  . Hyperlipidemia   . Hypertension   . Restless legs syndrome   . Seizures [facility]) January 2010   Post head trauma in fall .sees Dr Gerilyn Pilgrim   Past Surgical History:  Procedure Laterality Date  . ABDOMINAL HYSTERECTOMY    . COLONOSCOPY  OCT 2010   TORTUOUS, Sml IH, Tics, MULTIPLE SIMPLE ADENOMAS (<6MM)  . HIP ARTHROPLASTY Left 11/19/2014   Procedure: LEFT PARTIAL HIP REPLACEMENT;  Surgeon: Vickki Hearing, MD;  Location: [facility];  Service: Orthopedics;  Laterality: Left;  . ORIF HIP FRACTURE  04/05/2012   Procedure: OPEN REDUCTION INTERNAL FIXATION HIP;  Surgeon: Vickki Hearing, MD;  Location: [facility];  Service: Orthopedics;  Laterality: Right;  . UPPER GASTROINTESTINAL ENDOSCOPY  AUG 2010   SAVARY  . VESICOVAGINAL FISTULA CLOSURE W/ TAH  1966   bleeding    Family History  Problem Relation Age of Onset  . Cancer Mother        bladder   . Diabetes Mother   . Diabetes Father   . Emphysema Father   . GER disease Brother   . Colon cancer Neg Hx   . Colon polyps Neg Hx    History  Sexual Activity  . Sexual activity: No    Outpatient Encounter Prescriptions  as of 07/04/2017  Medication Sig  . acetaminophen (TYLENOL) 650 MG CR tablet Take 1,300 mg by mouth daily.  Marland Kitchen alendronate (FOSAMAX) 70 MG tablet Take with full glass of water once a day on Monday  . ammonium lactate (LAC-HYDRIN) 12 % lotion Apply to right knee once a day  . calcium-vitamin D (OSCAL WITH D) 500-200 MG-UNIT TABS TAKE (1) TABLET BY MOUTH (3) TIMES DAILY WITH MEALS.  Marland Kitchen docusate sodium (COLACE) 100 MG capsule Take 200 mg by mouth 2 (two) times daily.   Marland Kitchen FLUoxetine (PROZAC) 10 MG tablet Take 1 tablet 10 mg by mouth along with 20 mg to equal 30 mg once a day  . FLUoxetine (PROZAC) 20 MG tablet Take 1 tablet 20 mg along with 10 mg to equal 30 mg by mouth once a day  . hydrochlorothiazide (HYDRODIURIL) 12.5 MG tablet Take 12.5 mg by mouth daily.  Marland Kitchen HYDROcodone-acetaminophen (NORCO/VICODIN) 5-325 MG tablet Take 1 tablet by mouth every 6 (six) hours as needed for moderate pain.  Marland Kitchen ipratropium-albuterol (DUONEB) 0.5-2.5 (3) MG/3ML SOLN Give 1 vial inhalation twice a day for wheezing or SOB  . levETIRAcetam (KEPPRA) 500 MG tablet TAKE  1 TABLET BY MOUTH TWICE A DAY AS DIRECTED.  Marland Kitchen lovastatin (MEVACOR) 20 MG tablet Take 1 tablet (20 mg total) by mouth at bedtime.  . Melatonin 3 MG TABS Take 3 mg by mouth at bedtime.  . Multiple Vitamin (MULTIVITAMIN) tablet Take 1 tablet by mouth daily.  . potassium chloride (K-DUR,KLOR-CON) 10 MEQ tablet Take 10 mEq by mouth daily.  Marland Kitchen senna (SENOKOT) 8.6 MG TABS tablet Take 1 tablet by mouth at bedtime.  Marland Kitchen tiotropium (SPIRIVA) 18 MCG inhalation capsule Give 2 puffs once a day  . ZINC OXIDE EX Apply topically. Apply zinc oxide every shift prn apply to left and right sides under the breast where the bra rest  . [DISCONTINUED] aspirin 81 MG tablet Take 81 mg by mouth daily.   No facility-administered encounter medications on file as of 07/04/2017.     Activities of Daily Living In your present state of health, do you have any difficulty performing the  following activities: 07/04/2017  Hearing? Y  Vision? N  Difficulty concentrating or making decisions? N  Walking or climbing stairs? Y  Dressing or bathing? Y  Doing errands, shopping? Y  Preparing Food and eating ? Y  Using the Toilet? Y  In the past six months, have you accidently leaked urine? Y  Do you have problems with loss of bowel control? Y  Managing your Medications? Y  Managing your Finances? Y  Housekeeping or managing your Housekeeping? Y  Some recent data might be hidden    Patient Care Team: Kerri Perches, MD as PCP - General West Bali, MD (Gastroenterology)    Assessment:     Exercise Activities and Dietary recommendations Current Exercise Habits: The patient does not participate in regular exercise at present, Exercise limited by: orthopedic condition(s)  Goals    None     Fall Risk Fall Risk  07/04/2017 04/29/2014 07/30/2013  Falls in the past year? No Yes Yes  Number falls in past yr: - 1 2 or more  Injury with Fall? - No -  Risk for fall due to : - - Impaired balance/gait;Impaired mobility;History of fall(s)   Depression Screen PHQ 2/9 Scores 07/04/2017 04/29/2014  PHQ - 2 Score 4 2  PHQ- 9 Score 11 12     Cognitive Function MMSE - Mini Mental State Exam 04/29/2014  Orientation to time 5  Orientation to Place 5  Registration 3  Attention/ Calculation 5  Recall 2  Language- name 2 objects 2  Language- repeat 1  Language- follow 3 step command 3  Language- read & follow direction 1  Write a sentence 1  Copy design 0  Total score 28     6CIT Screen 07/04/2017  What Year? 0 points  What month? 0 points  What time? 0 points  Count back from 20 0 points  Months in reverse 0 points  Repeat phrase 4 points  Total Score 4    Immunization History  Administered Date(s) Administered  . Influenza Split 07/25/2012  . Influenza,inj,Quad PF,6+ Mos 07/30/2013, 08/18/2014  . Influenza-Unspecified 07/06/2016  . PPD Test 12/06/2014  .  Pneumococcal Conjugate-13 04/29/2014  . Pneumococcal Polysaccharide-23 07/25/2012  . Tdap 08/08/2011   Screening Tests Health Maintenance  Topic Date Due  . INFLUENZA VACCINE  09/07/2017 (Originally 05/08/2017)  . TETANUS/TDAP  08/07/2021  . DEXA SCAN  Completed  . PNA vac Low Risk Adult  Completed      Plan:  I have personally reviewed and addressed the Medicare  Annual Wellness questionnaire and have noted the following in the patient's chart:  A. Medical and social history B. Use of alcohol, tobacco or illicit drugs  C. Current medications and supplements D. Functional ability and status E.  Nutritional status F.  Physical activity G. Advance directives H. List of other physicians I.  Hospitalizations, surgeries, and ER visits in previous 12 months J.  Vitals K. Screenings to include hearing, vision, cognitive, depression L. Referrals and appointments - none  In addition, I have reviewed and discussed with patient certain preventive protocols, quality metrics, and best practice recommendations. A written personalized care plan for preventive services as well as general preventive health recommendations were provided to patient.  See attached scanned questionnaire for additional information.   Signed,   Annetta Maw, RN Nurse Health Advisor   Quick Notes   Health Maintenance: Pt will get flu vaccine when facility gets it in stock.     Abnormal Screen: PHQ-9:11     Patient Concerns: None     Nurse Concerns: None

## 2017-07-16 ENCOUNTER — Encounter: Payer: Self-pay | Admitting: Internal Medicine

## 2017-07-16 NOTE — Progress Notes (Signed)
Location:   [facility] Room Number: 145/D Place of Service:  SNF [phone]) Provider:  Odessa Fleming, MD  Patient Care Team: Kerri Perches, MD as PCP - General [place] Bali, MD (Gastroenterology)  Extended Emergency Contact Information Primary Emergency Contact: [facility] Address: [address]          [name], [place] [address] [place] Home Phone: [phone] Mobile Phone: [phone] Relation: Daughter Secondary Emergency Contact: Vivien Presto [place] of [place] Mobile Phone: [phone] Relation: Son  Code Status:  Full Code Goals of care: Advanced Directive information Advanced Directives 07/16/2017  Does Patient Have a Medical Advance Directive? Yes  Type of Advance Directive (No Data)  Does patient want to make changes to medical advance directive? No - Patient declined  Copy of Healthcare Power of Attorney in Chart? No - copy requested  Would patient like information on creating a medical advance directive? No - Patient declined  Pre-existing out of facility DNR order (yellow form or pink MOST form) -     Chief Complaint  Patient presents with  . Acute Visit    Hearing  and Psych Eval    HPI:  Pt is a 83 y.o. female seen today for an acute visit for    Past Medical History:  Diagnosis Date  . Diverticulosis OCT 2010 TCS   SIGMOID COLON  . Esophageal web 2010   DILATION 16 MM  . Gastritis, Helicobacter pylori 2010   Rx. ABO for 10 days   . GERD (gastroesophageal reflux disease)   . Hip fracture, left (HCC)    2016  . Hyperlipidemia   . Hypertension   . Restless legs syndrome   . Seizures [facility]) January 2010   Post head trauma in fall .sees Dr Gerilyn Pilgrim   Past Surgical History:  Procedure Laterality Date  . ABDOMINAL HYSTERECTOMY    . COLONOSCOPY  OCT 2010   TORTUOUS, Sml IH, Tics, MULTIPLE SIMPLE ADENOMAS (<6MM)  . HIP ARTHROPLASTY Left 11/19/2014   Procedure:  LEFT PARTIAL HIP REPLACEMENT;  Surgeon: Vickki Hearing, MD;  Location: [facility];  Service: Orthopedics;  Laterality: Left;  . ORIF HIP FRACTURE  04/05/2012   Procedure: OPEN REDUCTION INTERNAL FIXATION HIP;  Surgeon: Vickki Hearing, MD;  Location: [facility];  Service: Orthopedics;  Laterality: Right;  . UPPER GASTROINTESTINAL ENDOSCOPY  AUG 2010   SAVARY  . VESICOVAGINAL FISTULA CLOSURE W/ TAH  1966   bleeding     No Known Allergies  Outpatient Encounter Prescriptions as of 07/16/2017  Medication Sig  . acetaminophen (TYLENOL) 650 MG CR tablet Take 1,300 mg by mouth daily.  Marland Kitchen alendronate (FOSAMAX) 70 MG tablet Take with full glass of water once a day on Monday  . ammonium lactate (LAC-HYDRIN) 12 % lotion Apply to right knee once a day  . calcium-vitamin D (OSCAL WITH D) 500-200 MG-UNIT TABS TAKE (1) TABLET BY MOUTH (3) TIMES DAILY WITH MEALS.  Marland Kitchen docusate sodium (COLACE) 100 MG capsule Take 200 mg by mouth daily.   Marland Kitchen FLUoxetine (PROZAC) 10 MG tablet Take 1 tablet 10 mg by mouth along with 20 mg to equal 30 mg once a day  . FLUoxetine (PROZAC) 20 MG tablet Take 1 tablet 20 mg along with 10 mg to equal 30 mg by mouth once a day  . hydrochlorothiazide (HYDRODIURIL) 12.5 MG tablet Take 12.5 mg by mouth daily.  Marland Kitchen HYDROcodone-acetaminophen (NORCO/VICODIN) 5-325 MG tablet Take 1 tablet  by mouth every 8 (eight) hours as needed for moderate pain.  Marland Kitchen levETIRAcetam (KEPPRA) 500 MG tablet TAKE 1 TABLET BY MOUTH TWICE A DAY AS DIRECTED.  Marland Kitchen lovastatin (MEVACOR) 20 MG tablet Take 1 tablet (20 mg total) by mouth at bedtime.  . Melatonin 3 MG TABS Take 3 mg by mouth at bedtime.  . Multiple Vitamin (MULTIVITAMIN) tablet Take 1 tablet by mouth daily.  . potassium chloride (K-DUR,KLOR-CON) 10 MEQ tablet Take 10 mEq by mouth daily.  Marland Kitchen senna (SENOKOT) 8.6 MG TABS tablet Take 1 tablet by mouth at bedtime.  Marland Kitchen tiotropium (SPIRIVA) 18 MCG inhalation capsule Give 2 puffs once a day  . ZINC OXIDE EX Apply  topically. Apply zinc oxide every shift prn apply to left and right sides under the breast where the bra rest  . [DISCONTINUED] HYDROcodone-acetaminophen (NORCO/VICODIN) 5-325 MG tablet Take 1 tablet by mouth every 6 (six) hours as needed for moderate pain. (Patient taking differently: Take 1 tablet by mouth every 8 (eight) hours as needed for moderate pain. )  . [DISCONTINUED] ipratropium-albuterol (DUONEB) 0.5-2.5 (3) MG/3ML SOLN Give 1 vial inhalation twice a day for wheezing or SOB   No facility-administered encounter medications on file as of 07/16/2017.     Review of Systems  Immunization History  Administered Date(s) Administered  . Influenza Split 07/25/2012  . Influenza,inj,Quad PF,6+ Mos 07/30/2013, 08/18/2014  . Influenza-Unspecified 07/06/2016  . PPD Test 12/06/2014  . Pneumococcal Conjugate-13 04/29/2014  . Pneumococcal Polysaccharide-23 07/25/2012  . Tdap 08/08/2011   Pertinent  Health Maintenance Due  Topic Date Due  . INFLUENZA VACCINE  09/07/2017 (Originally 05/08/2017)  . DEXA SCAN  Completed  . PNA vac Low Risk Adult  Completed   Fall Risk  07/04/2017 04/29/2014 07/30/2013  Falls in the past year? No Yes Yes  Number falls in past yr: - 1 2 or more  Injury with Fall? - No -  Risk for fall due to : - - Impaired balance/gait;Impaired mobility;History of fall(s)   Functional Status Survey:    Vitals:   07/16/17 1435  BP: 131/81  Pulse: 71  Resp: 20  Temp: 98.4 F (36.9 C)  TempSrc: Oral  SpO2: 99%   There is no height or weight on file to calculate BMI. Physical Exam  Labs reviewed:  Recent Labs  04/09/17 0500 04/15/17 0900 06/19/17 0702  NA 140 141 141  K 3.5 3.8 4.4  CL 101 99* 100*  CO2 32 33* 34*  GLUCOSE 85 95 94  BUN 22* 20 16  CREATININE 0.83 0.75 0.80  CALCIUM 8.4* 8.5* 8.8*    Recent Labs  08/03/16 0730 02/05/17 0700 06/19/17 0702  AST ALT 39 18 18  ALKPHOS 111 58 55  BILITOT 0.8 0.7 0.5  PROT 7.8 7.6 6.8  ALBUMIN  3.9 4.1 3.6    Recent Labs  10/17/16 0500 01/04/17 0600 02/05/17 0700 06/19/17 0702  WBC 4.7 5.4 5.8 5.6  NEUTROABS 2.1  --  2.7  --   HGB 12.7 12.5 14.3 12.6  HCT 38.2 37.9 43.3 37.7  MCV 92.3 93.1 93.3 92.9  PLT 163 136* 158 165   Lab Results  Component Value Date   TSH 1.590 06/19/2017   No results found for: HGBA1C Lab Results  Component Value Date   CHOL 121 06/19/2017   HDL 43 06/19/2017   LDLCALC 67 06/19/2017   TRIG 54 06/19/2017   CHOLHDL 2.8 06/19/2017    Significant Diagnostic Results in  last 30 days:  No results found.  Assessment/Plan There are no diagnoses linked to this encounter.      London Sheer, [place] [phone]

## 2017-07-19 ENCOUNTER — Encounter (HOSPITAL_COMMUNITY)
Admission: RE | Admit: 2017-07-19 | Discharge: 2017-07-19 | Disposition: A | Discharge status: Home / Self Care | Payer: Medicare Other | Source: Skilled Nursing Facility | Attending: Internal Medicine | Admitting: Internal Medicine

## 2017-07-19 DIAGNOSIS — F482 Pseudobulbar affect: Secondary | ICD-10-CM | POA: Insufficient documentation

## 2017-07-19 DIAGNOSIS — F0390 Unspecified dementia without behavioral disturbance: Secondary | ICD-10-CM | POA: Insufficient documentation

## 2017-07-19 DIAGNOSIS — E119 Type 2 diabetes mellitus without complications: Secondary | ICD-10-CM | POA: Insufficient documentation

## 2017-07-19 DIAGNOSIS — R635 Abnormal weight gain: Secondary | ICD-10-CM | POA: Insufficient documentation

## 2017-07-19 DIAGNOSIS — F3289 Other specified depressive episodes: Secondary | ICD-10-CM | POA: Insufficient documentation

## 2017-07-19 DIAGNOSIS — J42 Unspecified chronic bronchitis: Secondary | ICD-10-CM | POA: Insufficient documentation

## 2017-07-19 DIAGNOSIS — F429 Obsessive-compulsive disorder, unspecified: Secondary | ICD-10-CM | POA: Insufficient documentation

## 2017-07-21 NOTE — Progress Notes (Signed)
This encounter was created in error - please disregard.

## 2017-07-25 ENCOUNTER — Encounter (HOSPITAL_COMMUNITY)
Admission: RE | Admit: 2017-07-25 | Discharge: 2017-07-25 | Disposition: A | Discharge status: Home / Self Care | Payer: Medicare Other | Source: Skilled Nursing Facility | Attending: Pediatrics | Admitting: Pediatrics

## 2017-07-25 DIAGNOSIS — F429 Obsessive-compulsive disorder, unspecified: Secondary | ICD-10-CM | POA: Insufficient documentation

## 2017-07-25 DIAGNOSIS — R635 Abnormal weight gain: Secondary | ICD-10-CM | POA: Insufficient documentation

## 2017-07-25 DIAGNOSIS — J42 Unspecified chronic bronchitis: Secondary | ICD-10-CM | POA: Insufficient documentation

## 2017-07-25 DIAGNOSIS — F0390 Unspecified dementia without behavioral disturbance: Secondary | ICD-10-CM | POA: Insufficient documentation

## 2017-07-25 DIAGNOSIS — F482 Pseudobulbar affect: Secondary | ICD-10-CM | POA: Insufficient documentation

## 2017-07-25 DIAGNOSIS — F3289 Other specified depressive episodes: Secondary | ICD-10-CM | POA: Diagnosis present

## 2017-07-25 LAB — HEMOGLOBIN A1C
Hgb A1c MFr Bld: 5.5 % (ref 4.8–5.6)
Mean Plasma Glucose: 111.15 mg/dL

## 2017-08-02 ENCOUNTER — Encounter: Payer: Self-pay | Admitting: Internal Medicine

## 2017-08-02 ENCOUNTER — Non-Acute Institutional Stay (SKILLED_NURSING_FACILITY): Payer: Medicare Other | Admitting: Internal Medicine

## 2017-08-02 DIAGNOSIS — M810 Age-related osteoporosis without current pathological fracture: Secondary | ICD-10-CM | POA: Diagnosis not present

## 2017-08-02 DIAGNOSIS — F418 Other specified anxiety disorders: Secondary | ICD-10-CM

## 2017-08-02 DIAGNOSIS — M25519 Pain in unspecified shoulder: Secondary | ICD-10-CM | POA: Diagnosis not present

## 2017-08-02 DIAGNOSIS — I10 Essential (primary) hypertension: Secondary | ICD-10-CM

## 2017-08-02 DIAGNOSIS — G40909 Epilepsy, unspecified, not intractable, without status epilepticus: Secondary | ICD-10-CM

## 2017-08-02 DIAGNOSIS — E785 Hyperlipidemia, unspecified: Secondary | ICD-10-CM

## 2017-08-02 NOTE — Progress Notes (Signed)
Location:   [facility] Room Number: 145/D Place of Service:  SNF (31) Provider:  Odessa Fleming, MD  Patient Care Team: Kerri Perches, MD as PCP - General West Bali, MD (Gastroenterology)  Extended Emergency Contact Information Primary Emergency Contact: [facility] Address: [address]          [name], [place] [address] [place] of [place] Home Phone: [phone] Mobile Phone: [phone] Relation: Daughter Secondary Emergency Contact: Vivien Presto [place] of [place] Mobile Phone: [phone] Relation: Son  Code Status:  Full Code Goals of care: Advanced Directive information Advanced Directives 08/02/2017  Does Patient Have a Medical Advance Directive? Yes  Type of Advance Directive (No Data)  Does patient want to make changes to medical advance directive? No - Patient declined  Copy of Healthcare Power of Attorney in Chart? No - copy requested  Would patient like information on creating a medical advance directive? No - Patient declined  Pre-existing out of facility DNR order (yellow form or pink MOST form) -     Chief Complaint  Patient presents with  . Medical Management of Chronic Issues    Routine Visit  r medical management of chronic medical conditions including hypertension-seizure disorder-anemia-chronic venous stasis-chronic osteoarthritis-bronchitis-osteoporosis-hyperlipidemia-  HPI:  Pt is a 83 y.o. female seen today for medical management of chronic diseases as noted above.  She continues to be quite stable recently was thought she was having increased depressive symptoms she is on Prozac and this appears to have stabilized she is. Is less anxious-is out the hallway interacting with other residents ambulating down the hall which is more her baseline she no longer complains of feeling depressed.   \Her weight appears relatively stable at 267 previously she had been down to  262-there is some variation here but this appears to be relatively in her baseline in the 260s.  I suspect she was feeling somewhat depressed her appetite probably was not as good either. PR  She does have a history of hypertension on hydrochlorothiazide manual reading today was 126/90 SE previous listed reading 144/80 and 158/70 but I do not believe she has consistent elevations but this will have to be monitored.  Regards to anemia hemoglobin has been stable at 12.6 and appears hemoglobin runs from 12-14 generally.  She does have a history chronic venous stasis which appears stable she has chronic edema of her lower extremities but this does not appear to be increased.  She also has significant osteoarthritis especially of her knees she is on Tylenol arthritis she also has issues with her right shoulder with the Tylenol arthritis appears to help with this as well. She has Norco as needed as well--  Barrett's chronic bronchitis she is on Spiriva she has not had any recent acute episodes.  She's also been started on Fosamax with a history of osteoporosis she is also on calcium.  When Dr. Chales Abrahams saw her recently she did complain of constipation she is now on senna and Colace and this appears to have resolved.  Regards hyperlipidemia she is on lovastatin LDL was 67 on lab done last month in September.  Currently she is sitting in her wheelchair has just finished eating appears to have a good appetite she has no complaints she is pleasant smiling          Past Medical History:  Diagnosis Date  . Diverticulosis OCT 2010 TCS   SIGMOID COLON  . Esophageal web 2010   DILATION  16 MM  . Gastritis, Helicobacter pylori 2010   Rx. ABO for 10 days   . GERD (gastroesophageal reflux disease)   . Hip fracture, left (HCC)    2016  . Hyperlipidemia   . Hypertension   . Restless legs syndrome   . Seizures [facility]) January 2010   Post head trauma in fall .sees Dr Gerilyn Pilgrim   Past Surgical  History:  Procedure Laterality Date  . ABDOMINAL HYSTERECTOMY    . COLONOSCOPY  OCT 2010   TORTUOUS, Sml IH, Tics, MULTIPLE SIMPLE ADENOMAS (<6MM)  . HIP ARTHROPLASTY Left 11/19/2014   Procedure: LEFT PARTIAL HIP REPLACEMENT;  Surgeon: Vickki Hearing, MD;  Location: [facility];  Service: Orthopedics;  Laterality: Left;  . ORIF HIP FRACTURE  04/05/2012   Procedure: OPEN REDUCTION INTERNAL FIXATION HIP;  Surgeon: Vickki Hearing, MD;  Location: [facility];  Service: Orthopedics;  Laterality: Right;  . UPPER GASTROINTESTINAL ENDOSCOPY  AUG 2010   SAVARY  . VESICOVAGINAL FISTULA CLOSURE W/ TAH  1966   bleeding     No Known Allergies  Outpatient Encounter Prescriptions as of 08/02/2017  Medication Sig  . acetaminophen (TYLENOL) 650 MG CR tablet Take 1,300 mg by mouth daily.  Marland Kitchen alendronate (FOSAMAX) 70 MG tablet Take with full glass of water once a day on Monday  . ammonium lactate (LAC-HYDRIN) 12 % lotion Apply to right knee once a day  . aspirin 81 MG chewable tablet Chew 81 mg by mouth daily.  . calcium-vitamin D (OSCAL WITH D) 500-200 MG-UNIT TABS TAKE (1) TABLET BY MOUTH (3) TIMES DAILY WITH MEALS.  Marland Kitchen docusate sodium (COLACE) 100 MG capsule Take 200 mg by mouth daily.   Marland Kitchen FLUoxetine (PROZAC) 10 MG tablet Take 1 tablet 10 mg by mouth along with 20 mg to equal 30 mg once a day  . FLUoxetine (PROZAC) 20 MG tablet Take 1 tablet 20 mg along with 10 mg to equal 30 mg by mouth once a day  . hydrochlorothiazide (HYDRODIURIL) 12.5 MG tablet Take 12.5 mg by mouth daily.  Marland Kitchen HYDROcodone-acetaminophen (NORCO/VICODIN) 5-325 MG tablet Take 1 tablet by mouth every 8 (eight) hours as needed for moderate pain.  Marland Kitchen levETIRAcetam (KEPPRA) 500 MG tablet TAKE 1 TABLET BY MOUTH TWICE A DAY AS DIRECTED.  Marland Kitchen lovastatin (MEVACOR) 20 MG tablet Take 1 tablet (20 mg total) by mouth at bedtime.  . Melatonin 3 MG TABS Take 3 mg by mouth at bedtime.  . Multiple Vitamin (MULTIVITAMIN) tablet Take 1 tablet by mouth daily.    . potassium chloride (K-DUR,KLOR-CON) 10 MEQ tablet Take 10 mEq by mouth daily.  Marland Kitchen senna (SENOKOT) 8.6 MG TABS tablet Take 1 tablet by mouth at bedtime.  Marland Kitchen tiotropium (SPIRIVA) 18 MCG inhalation capsule Give 2 puffs once a day  . [DISCONTINUED] ZINC OXIDE EX Apply topically. Apply zinc oxide every shift prn apply to left and right sides under the breast where the bra rest   No facility-administered encounter medications on file as of 08/02/2017.      Review of Systems   In general she is not complaining of fever chills weight appears to be relatively stable.  Skin does not complain of rashes or itching.  Head ears eyes nose mouth and throat does not complaining of any sore throat visual changes she has prescription lenses.  Respiratory is not complaining of shortness of breath or cough.  Cardiac does not complain of chest pain has chronic lower extremity edema which appears relatively unchanged if  anything slightly improved.  GI does not complain of abdominal discomfort nausea vomiting diarrhea breath this point constipation she is now on senna and Colace this appears to be helping.  GU does not complain of dysuria.  Muscle skeletal has some complaints of chronic joint pain especially right shoulder but these appear to be stabilized now with the Tylenol arthritis as well as Norco when necessary.  Neurologic does not complain of dizziness headache or syncope does have a history seizure disorder but this is stable on Keppra for an extended period of time.  Psych does have a history of depression she is on Paxil this appears to have improved recently.     Immunization History  Administered Date(s) Administered  . Influenza Split 07/25/2012  . Influenza,inj,Quad PF,6+ Mos 07/30/2013, 08/18/2014  . Influenza-Unspecified 07/06/2016  . PPD Test 12/06/2014  . Pneumococcal Conjugate-13 04/29/2014  . Pneumococcal Polysaccharide-23 07/25/2012  . Tdap 08/08/2011   Pertinent  Health  Maintenance Due  Topic Date Due  . INFLUENZA VACCINE  09/07/2017 (Originally 05/08/2017)  . DEXA SCAN  Completed  . PNA vac Low Risk Adult  Completed   Fall Risk  07/04/2017 04/29/2014 07/30/2013  Falls in the past year? No Yes Yes  Number falls in past yr: - 1 2 or more  Injury with Fall? - No -  Risk for fall due to : - - Impaired balance/gait;Impaired mobility;History of fall(s)   Functional Status Survey:    Vitals:   08/02/17 1102  BP: (!) 158/70  Pulse: 72  Resp: 18  Temp: 98.2 F (36.8 C)  TempSrc: Oral  SpO2: 98%  Weight: 267 lb (121.1 kg)  Height: 5\' 7"  (1.702 m)  Of note manual blood pressure was 126/90  Body mass index is 41.82 kg/m. Physical Exam  Gen. this is a pleasant somewhat obese elderly female in no distress sitting comfortably in her wheelchair.  Her skin is warm and dry.  Eyes visual acuity appears grossly intact she has prescription lenses.  Oropharynx is clear mucous membranes moist.  Chest is clear to auscultation there is no labored breathing.  Heart is regular rate and rhythm without murmur gallop or rub occasionally there is a skipped beat but this is not frequent she has mild lower extremity edema bilaterally if anything improved from previous exams.  Her abdomen is obese soft nontender with active bowel sounds.  Muscle skeletal does move all extremities 4 limited range of motion of her right shoulder which is not new has arthritic changes of her knees bilaterally largely ambulates in a wheelchair at times will use a walker with assistance area  Neurologic is grossly intact her speech is clear no lateralizing findings  psych she is largely alert and oriented does apparently have some confusion at times-she is pleasant and appropriate   Labs reviewed:  Recent Labs  04/09/17 0500 04/15/17 0900 06/19/17 0702  NA 140 141 141  K 3.5 3.8 4.4  CL 101 99* 100*  CO2 32 33* 34*  GLUCOSE 85 95 94  BUN 22* 20 16  CREATININE 0.83 0.75 0.80    CALCIUM 8.4* 8.5* 8.8*    Recent Labs  08/03/16 0730 02/05/17 0700 06/19/17 0702  AST 27 25 22   ALT 39 18 18  ALKPHOS 111 58 55  BILITOT 0.8 0.7 0.5  PROT 7.8 7.6 6.8  ALBUMIN 3.9 4.1 3.6    Recent Labs  10/17/16 0500 01/04/17 0600 02/05/17 0700 06/19/17 0702  WBC 4.7 5.4 5.8 5.6  NEUTROABS 2.1  --  2.7  --   HGB 12.7 12.5 14.3 12.6  HCT 38.2 37.9 43.3 37.7  MCV 92.3 93.1 93.3 92.9  PLT 163 136* 158 165   Lab Results  Component Value Date   TSH 1.590 06/19/2017   Lab Results  Component Value Date   HGBA1C 5.5 07/25/2017   Lab Results  Component Value Date   CHOL 121 06/19/2017   HDL 43 06/19/2017   LDLCALC 67 06/19/2017   TRIG 54 06/19/2017   CHOLHDL 2.8 06/19/2017    Significant Diagnostic Results in last 30 days:  No results found.  Assessment/Plan  #1-hypertension-again she appears to have some variable readings but manual reading was  fairly stable today at 126/90 she does not have a history of having consistently elevated readings at this point will monitor she is on hydrochlorothiazide   #2 history of chronic osteoarthritis in this appears stabilized with her Tylenol arthritis in the morning as well as lichen and as Norco as needed she is not complaining of joint pain today this is been somewhat persistent complaint previously.  #3 history of chronic bronchitis she is on Spiriva this is been stable now for some time she does not complain of increased cough or shortness of breath.  #4 history seizure disorder this is been stable for an extended period of time on Keppra.  #5 history of anemia this is stable with a hemoglobin of 0.6 as noted above.  #6-history of chronic venous stasis   she is on low-dose hydrochlorothiazide this appears to be stable her exam today BNP  in the past and been quite unremarkable.  #7-history of osteoporosis she has been started on Fosamax she is also on calcium she appears to be tolerating this well.--She does have a  history of a right hip fracture previously that was surgically repaired  Number  8- a history of depression this was an issue recently but at this point Paxil appears to be effective  she is bright alert back  ambulating in the hall denies depressive symptoms  #9-constipation this was an issue recently but appears to have improved with the senna and Colace.  #10 hyperlipidemia she is on lovastatin LDL was 67 on lab done in September 2018.  #11 history of insomnia she is on melatonin apparently this is helping she does not complain of insomnia today.  .  At this point again she appears to be doing well does not complain of joint pain today which is reassuring-at this point will continue to monitor her blood pressures she does not have a history of consistent elevations.  Recent labs appear to be largely within normal limits.  [id number] note greater than 35 minutes spent assessing patient-reviewing her chart-reviewing her labs-and coordinating and formulating a plan of care for numerous diagnoses-of note greater than 50% of time spent coordinating plan of care

## 2017-08-14 ENCOUNTER — Other Ambulatory Visit: Payer: Self-pay

## 2017-08-14 MED ORDER — HYDROCODONE-ACETAMINOPHEN 5-325 MG PO TABS: 1.0000 | ORAL_TABLET | Freq: Three times a day (TID) | ORAL | 0 refills | Status: DC | PRN

## 2017-08-14 NOTE — Telephone Encounter (Signed)
RX Fax for [facility]@ [phone]  

## 2017-09-19 ENCOUNTER — Encounter: Payer: Self-pay | Admitting: Internal Medicine

## 2017-09-19 NOTE — Progress Notes (Signed)
Location:   [facility] Room Number: 145/D Place of Service:  SNF (31) Provider:  Valrie HartAnjali,Gupta  Simpson, Margaret E, MD  Patient Care Team: Kerri PerchesSimpson, Margaret E, MD as PCP - General West BaliFields, Sandi L, MD (Gastroenterology)  Extended Emergency Contact Information Primary Emergency Contact: Vawter,Alfreda Address: [address]          [name], [place] [address] [place] Home Phone: [phone] Mobile Phone: [phone] Relation: Daughter Secondary Emergency Contact: Vivien PrestoMiller,Alfred  [place] Mobile Phone: [phone] Relation: Son  Code Status:  Full Code Goals of care: Advanced Directive information Advanced Directives 09/19/2017  Does Patient Have a Medical Advance Directive? Yes  Type of Advance Directive (No Data)  Does patient want to make changes to medical advance directive? No - Patient declined  Copy of Healthcare Power of Attorney in Chart? No - copy requested  Would patient like information on creating a medical advance directive? -  Pre-existing out of facility DNR order (yellow form or pink MOST form) -     Chief Complaint  Patient presents with  . Medical Management of Chronic Issues    Routine Visit    HPI:  Pt is a 83 y.o. female seen today for medical management of chronic diseases.     Past Medical History:  Diagnosis Date  . Diverticulosis OCT 2010 TCS   SIGMOID COLON  . Esophageal web 2010   DILATION 16 MM  . Gastritis, Helicobacter pylori 2010   Rx. ABO for 10 days   . GERD (gastroesophageal reflux disease)   . Hip fracture, left (HCC)    2016  . Hyperlipidemia   . Hypertension   . Restless legs syndrome   . Seizures [facility](HCC) January 2010   Post head trauma in fall .[place] Dr [place]   Past Surgical History:  Procedure Laterality Date  . ABDOMINAL HYSTERECTOMY    . COLONOSCOPY  OCT 2010   TORTUOUS, Sml IH, Tics, MULTIPLE SIMPLE ADENOMAS (<6MM)  . HIP ARTHROPLASTY Left  11/19/2014   Procedure: LEFT PARTIAL HIP REPLACEMENT;  Surgeon: Vickki HearingStanley E Harrison, MD;  Location: [facility];  Service: Orthopedics;  Laterality: Left;  . ORIF HIP FRACTURE  04/05/2012   Procedure: OPEN REDUCTION INTERNAL FIXATION HIP;  Surgeon: Vickki HearingStanley E Harrison, MD;  Location: [facility];  Service: Orthopedics;  Laterality: Right;  . UPPER GASTROINTESTINAL ENDOSCOPY  AUG 2010   SAVARY  . VESICOVAGINAL FISTULA CLOSURE W/ TAH  1966   bleeding     No Known Allergies  Outpatient Encounter Medications as of 09/19/2017  Medication Sig  . acetaminophen (TYLENOL) 650 MG CR tablet Take 1,300 mg by mouth daily.  Marland Kitchen. alendronate (FOSAMAX) 70 MG tablet Take with full glass of water once a day on Monday  . ammonium lactate (LAC-HYDRIN) 12 % lotion Apply to right knee once a day  . aspirin 81 MG chewable tablet Chew 81 mg by mouth daily.  . calcium-vitamin D (OSCAL WITH D) 500-200 MG-UNIT TABS TAKE (1) TABLET BY MOUTH (3) TIMES DAILY WITH MEALS.  Marland Kitchen. docusate sodium (COLACE) 100 MG capsule Take 200 mg by mouth daily.   Marland Kitchen. FLUoxetine (PROZAC) 10 MG tablet Take 1 tablet 10 mg by mouth along with 20 mg to equal 30 mg once a day  . FLUoxetine (PROZAC) 20 MG tablet Take 1 tablet 20 mg along with 10 mg to equal 30 mg by mouth once a day  . hydrochlorothiazide (HYDRODIURIL) 12.5 MG tablet Take 12.5 mg by mouth  daily.  Marland Kitchen. HYDROcodone-acetaminophen (NORCO/VICODIN) 5-325 MG tablet Take 1 tablet every 8 (eight) hours as needed by mouth for moderate pain.  Marland Kitchen. levETIRAcetam (KEPPRA) 500 MG tablet TAKE 1 TABLET BY MOUTH TWICE A DAY AS DIRECTED.  Marland Kitchen. lovastatin (MEVACOR) 20 MG tablet Take 1 tablet (20 mg total) by mouth at bedtime.  . Melatonin 3 MG TABS Take 3 mg by mouth at bedtime.  . Multiple Vitamin (MULTIVITAMIN) tablet Take 1 tablet by mouth daily.  . polyethylene glycol (MIRALAX / GLYCOLAX) packet Take 17 g by mouth daily.  . potassium chloride (K-DUR,KLOR-CON) 10 MEQ tablet Take 10 mEq by mouth daily.  Marland Kitchen. senna (SENOKOT)  8.6 MG TABS tablet Take 1 tablet by mouth at bedtime.  Marland Kitchen. tiotropium (SPIRIVA) 18 MCG inhalation capsule Give 2 puffs once a day   No facility-administered encounter medications on file as of 09/19/2017.      Review of Systems  Immunization History  Administered Date(s) Administered  . Influenza Split 07/25/2012  . Influenza,inj,Quad PF,6+ Mos 07/30/2013, 08/18/2014  . Influenza-Unspecified 07/06/2016, 07/12/2017  . PPD Test 12/06/2014  . Pneumococcal Conjugate-13 04/29/2014  . Pneumococcal Polysaccharide-23 07/25/2012  . Tdap 08/08/2011   Pertinent  Health Maintenance Due  Topic Date Due  . INFLUENZA VACCINE  Completed  . DEXA SCAN  Completed  . PNA vac Low Risk Adult  Completed   Fall Risk  07/04/2017 04/29/2014 07/30/2013  Falls in the past year? No Yes Yes  Number falls in past yr: - 1 2 or more  Injury with Fall? - No -  Risk for fall due to : - - Impaired balance/gait;Impaired mobility;History of fall(s)   Functional Status Survey:    There were no vitals filed for this visit. There is no height or weight on file to calculate BMI. Physical Exam  Labs reviewed: Recent Labs    04/09/17 0500 04/15/17 0900 06/19/17 0702  NA 140 141 141  K 3.5 3.8 4.4  CL 101 99* 100*  CO2 32 33* 34*  GLUCOSE 85 95 94  BUN 22* 20 16  CREATININE 0.83 0.75 0.80  CALCIUM 8.4* 8.5* 8.8*   Recent Labs    02/05/17 0700 06/19/17 0702  AST 25 22  ALT 18 18  ALKPHOS 58 55  BILITOT 0.7 0.5  PROT 7.6 6.8  ALBUMIN 4.1 3.6   Recent Labs    10/17/16 0500 01/04/17 0600 02/05/17 0700 06/19/17 0702  WBC 4.7 5.4 5.8 5.6  NEUTROABS 2.1  --  2.7  --   HGB 12.7 12.5 14.3 12.6  HCT 38.2 37.9 43.3 37.7  MCV 92.3 93.1 93.3 92.9  PLT 163 136* 158 165   Lab Results  Component Value Date   TSH 1.590 06/19/2017   Lab Results  Component Value Date   HGBA1C 5.5 07/25/2017   Lab Results  Component Value Date   CHOL 121 06/19/2017   HDL 43 06/19/2017   LDLCALC 67 06/19/2017    TRIG 54 06/19/2017   CHOLHDL 2.8 06/19/2017    Significant Diagnostic Results in last 30 days:  No results found.  Assessment/Plan There are no diagnoses linked to this encounter.   Family/ staff Communication:   Labs/tests ordered:      This encounter was created in error - please disregard.

## 2017-10-03 ENCOUNTER — Non-Acute Institutional Stay (SKILLED_NURSING_FACILITY): Payer: Medicare Other | Admitting: Internal Medicine

## 2017-10-03 ENCOUNTER — Encounter: Payer: Self-pay | Admitting: Internal Medicine

## 2017-10-03 DIAGNOSIS — I10 Essential (primary) hypertension: Secondary | ICD-10-CM

## 2017-10-03 DIAGNOSIS — R062 Wheezing: Secondary | ICD-10-CM | POA: Diagnosis not present

## 2017-10-03 DIAGNOSIS — J42 Unspecified chronic bronchitis: Secondary | ICD-10-CM

## 2017-10-03 DIAGNOSIS — D649 Anemia, unspecified: Secondary | ICD-10-CM | POA: Diagnosis not present

## 2017-10-03 NOTE — Progress Notes (Signed)
Location:   [facility] Room Number: 145/D Place of Service:  SNF [phone](31) Provider:  Odessa FlemingArlo Cruz  Cruz, Alejandra E, MD  Patient Care Team: Alejandra Cruz, Alejandra E, MD as PCP - General Alejandra BaliFields, Alejandra L, MD (Gastroenterology)  Extended Emergency Contact Information Primary Emergency Contact: Alejandra Cruz Address: [address]          [name], [place] [address] [place] Home Phone: [phone] Mobile Phone: [phone] Relation: Daughter Secondary Emergency Contact: Alejandra Cruz,Alejandra  [place] Mobile Phone: [phone] Relation: Son  Code Status:  Full Code Goals of care: Advanced Directive information Advanced Directives 10/03/2017  Does Patient Have a Medical Advance Directive? Yes  Type of Advance Directive (No Data)  Does patient want to make changes to medical advance directive? No - Patient declined  Copy of Healthcare Power of Attorney in Chart? No - copy requested  Would patient like information on creating a medical advance directive? No - Patient declined  Pre-existing out of facility DNR order (yellow form or pink MOST form) -     Chief Complaint  Patient presents with  . Acute Visit    Patients c/o Wheezing    HPI:  Pt is a 83 y.o. female seen today for an acute visit for follow-up of wheezing.  Patient is a long-term resident of the facility with numerous diagnoses including osteoarthritis-seizure disorder- venous stasis-osteoporosis depression and hypertension.  She also has a listed history of chronic bronchitis and is on Spiriva.  She has been quite stable-she was out for Christmas and her son did notice that when she got up and used a walker she started wheezing --- she does not really use a walker much here and there is some suspicion that with some deconditioning and more physical exertion at home this probably led to some wheezing episodes.  She is not complaining of shortness of breath but  she did state that at times in the morning she feels she has some wheezing.  Vital signs are stable O2 saturations are in the 90s on room air- she appears to be at her baseline today  Past Medical History:  Diagnosis Date  . Diverticulosis OCT 2010 TCS   SIGMOID COLON  . Esophageal web 2010   DILATION 16 MM  . Gastritis, Helicobacter pylori 2010   Rx. ABO for 10 days   . GERD (gastroesophageal reflux disease)   . Hip fracture, left (HCC)    2016  . Hyperlipidemia   . Hypertension   . Restless legs syndrome   . Seizures [facility](HCC) January 2010   Post head trauma in fall .sees Dr [place]ilyn [place]   Past Surgical History:  Procedure Laterality Date  . ABDOMINAL HYSTERECTOMY    . COLONOSCOPY  OCT 2010   TORTUOUS, Sml IH, Tics, MULTIPLE SIMPLE ADENOMAS (<6MM)  . HIP ARTHROPLASTY Left 11/19/2014   Procedure: LEFT PARTIAL HIP REPLACEMENT;  Surgeon: Vickki HearingStanley E Harrison, MD;  Location: [facility];  Service: Orthopedics;  Laterality: Left;  . ORIF HIP FRACTURE  04/05/2012   Procedure: OPEN REDUCTION INTERNAL FIXATION HIP;  Surgeon: Vickki HearingStanley E Harrison, MD;  Location: [facility];  Service: Orthopedics;  Laterality: Right;  . UPPER GASTROINTESTINAL ENDOSCOPY  AUG 2010   SAVARY  . VESICOVAGINAL FISTULA CLOSURE W/ TAH  1966   bleeding     No Known Allergies  Outpatient Encounter Medications as of 10/03/2017  Medication Sig  . acetaminophen (TYLENOL) 650 MG CR tablet Take 1,300 mg by mouth daily.  .Marland Kitchen  alendronate (FOSAMAX) 70 MG tablet Take with full glass of water once a day on Monday  . ammonium lactate (LAC-HYDRIN) 12 % lotion Apply to right knee once a day  . aspirin 81 MG chewable tablet Chew 81 mg by mouth daily.  . calcium-vitamin D (OSCAL WITH D) 500-200 MG-UNIT TABS TAKE (1) TABLET BY MOUTH (3) TIMES DAILY WITH MEALS.  Marland Kitchen docusate sodium (COLACE) 100 MG capsule Take 200 mg by mouth daily.   Marland Kitchen FLUoxetine (PROZAC) 10 MG tablet Take 1 tablet 10 mg by mouth along with 20 mg to equal 30 mg once a day  .  FLUoxetine (PROZAC) 20 MG tablet Take 1 tablet 20 mg along with 10 mg to equal 30 mg by mouth once a day  . hydrochlorothiazide (HYDRODIURIL) 12.5 MG tablet Take 12.5 mg by mouth daily.  Marland Kitchen HYDROcodone-acetaminophen (NORCO/VICODIN) 5-325 MG tablet Take 1 tablet every 8 (eight) hours as needed by mouth for moderate pain.  Marland Kitchen levETIRAcetam (KEPPRA) 500 MG tablet TAKE 1 TABLET BY MOUTH TWICE A DAY AS DIRECTED.  Marland Kitchen lovastatin (MEVACOR) 20 MG tablet Take 1 tablet (20 mg total) by mouth at bedtime.  . Melatonin 3 MG TABS Take 3 mg by mouth at bedtime.  . Multiple Vitamin (MULTIVITAMIN) tablet Take 1 tablet by mouth daily.  . polyethylene glycol (MIRALAX / GLYCOLAX) packet Take 17 g by mouth daily.  . potassium chloride (K-DUR,KLOR-CON) 10 MEQ tablet Take 10 mEq by mouth daily.  Marland Kitchen senna (SENOKOT) 8.6 MG TABS tablet Take 2 tablets by mouth at bedtime.   Marland Kitchen tiotropium (SPIRIVA) 18 MCG inhalation capsule Give 2 puffs once a day   No facility-administered encounter medications on file as of 10/03/2017.     Review of Systems   In general she is not complaining any fever or chills.  Skin does not complain of rashes or itching.  Head ears eyes nose mouth and throat has prescription lenses is not complaining of visual changes  Respiratory does not complain of shortness of breath does have a history of chronic bronchitis And think she does occasionally wheeze in the morning.  Cardiac does not complain of chest pain lower extremity edema appears to be stable.  GI is not complaining of abdominal discomfort apparently has a very good appetite and compared to admission has gained a significant weight but this has stabilized lately she does snack a lot.  GU is not complaining of dysuria.  Musculoskeletal has some chronic complaints of joint pain in the past including shoulder pain but is not really complaining of that this evening she is on Tylenol arthritis as well as Norco as needed.  Neurologic does not  complain of dizziness headache or syncope does have a history of seizure disorder has been stable for a considerable amount of time.  Psych does have a history of depression but this appears well controlled on the Paxil.    Immunization History  Administered Date(s) Administered  . Influenza Split 07/25/2012  . Influenza,inj,Quad PF,6+ Mos 07/30/2013, 08/18/2014  . Influenza-Unspecified 07/06/2016, 07/12/2017  . PPD Test 12/06/2014  . Pneumococcal Conjugate-13 04/29/2014  . Pneumococcal Polysaccharide-23 07/25/2012  . Tdap 08/08/2011   Pertinent  Health Maintenance Due  Topic Date Due  . INFLUENZA VACCINE  Completed  . DEXA SCAN  Completed  . PNA vac Low Risk Adult  Completed   Fall Risk  07/04/2017 04/29/2014 07/30/2013  Falls in the past year? No Yes Yes  Number falls in past yr: - 1 2 or more  Injury with Fall? - No -  Risk for fall due to : - - Impaired balance/gait;Impaired mobility;History of fall(s)   Functional Status Survey:    Temperature is 97.7 pulse is 66 respirations 18 O2 saturation is in the 90s on room air-- weight is 265 but this appears relatively stable past 6 months   Physical Exam   In general this is a pleasant elderly female when I assessed her she was actually lying in bed.  Her skin is warm and dry.  Eyes she has prescription lenses visual acuity appears grossly intact.  Oropharynx is clear mucous membranes moist.  Chest is clear to auscultation with somewhat shallow air entry there is no labored breathing.  Heart is regular rate and rhythm without murmur gallop or rub she has mild lower extremity edema this actually appears improved I suspect with her legs elevated in bed  this is helping   Abdomen is obese soft nontender with positive bowel sounds.  Musculoskeletal Limited exam since she is in bed but she does move all extremities x4 with limited range of motion of her shoulders which probably her right shoulder-she largely ambulates in a  wheelchair and facility and does well with this.  Neurologic is grossly intact her speech is clear she is alert.  Psych she is grossly alert and oriented with some confusion at times but largely pleasant and appropriate.   Labs reviewed: Recent Labs    04/09/17 0500 04/15/17 0900 06/19/17 0702  NA 140 141 141  K 3.5 3.8 4.4  CL 101 99* 100*  CO2 32 33* 34*  GLUCOSE 85 95 94  BUN 22* 20 16  CREATININE 0.83 0.75 0.80  CALCIUM 8.4* 8.5* 8.8*   Recent Labs    02/05/17 0700 06/19/17 0702  AST 25 22  ALT 18 18  ALKPHOS 58 55  BILITOT 0.7 0.5  PROT 7.6 6.8  ALBUMIN 4.1 3.6   Recent Labs    10/17/16 0500 01/04/17 0600 02/05/17 0700 06/19/17 0702  WBC 4.7 5.4 5.8 5.6  NEUTROABS 2.1  --  2.7  --   HGB 12.7 12.5 14.3 12.6  HCT 38.2 37.9 43.3 37.7  MCV 92.3 93.1 93.3 92.9  PLT 163 136* 158 165   Lab Results  Component Value Date   TSH 1.590 06/19/2017   Lab Results  Component Value Date   HGBA1C 5.5 07/25/2017   Lab Results  Component Value Date   CHOL 121 06/19/2017   HDL 43 06/19/2017   LDLCALC 67 06/19/2017   TRIG 54 06/19/2017   CHOLHDL 2.8 06/19/2017    Significant Diagnostic Results in last 30 days:  No results found.  Assessment/Plan  #1-history of chronic bronchitis-she appears to be stable family has noted apparently with some exertion especially she has some wheezing she reports she may wheeze at times in the morning-Will add duo nebs every 6 hours as needed also will check a two-view chest x-ray continue Spiriva.  2.  History of hypertension she continues on hydrochlorothiazide blood pressure appears to be stable will monitor and will update a metabolic panel make sure electrolytes are stable.--She is on low-dose potassium  Recent blood pressures 127/72- 122/74   3.  History of anemia hemoglobin of 12.6 on lab done in September appears to be stable-With previous hemoglobins in the 12-14 range- will update this  #4 history of seizure disorder  this has been stable for an extended period of time on Keppra.  [id number]

## 2017-10-04 ENCOUNTER — Encounter (HOSPITAL_COMMUNITY)
Admission: RE | Admit: 2017-10-04 | Discharge: 2017-10-04 | Disposition: A | Discharge status: Home / Self Care | Payer: Medicare Other | Source: Skilled Nursing Facility | Attending: Internal Medicine | Admitting: Internal Medicine

## 2017-10-04 DIAGNOSIS — F482 Pseudobulbar affect: Secondary | ICD-10-CM | POA: Diagnosis present

## 2017-10-04 DIAGNOSIS — I10 Essential (primary) hypertension: Secondary | ICD-10-CM | POA: Diagnosis not present

## 2017-10-04 DIAGNOSIS — F429 Obsessive-compulsive disorder, unspecified: Secondary | ICD-10-CM | POA: Insufficient documentation

## 2017-10-04 DIAGNOSIS — F3289 Other specified depressive episodes: Secondary | ICD-10-CM | POA: Insufficient documentation

## 2017-10-04 DIAGNOSIS — R635 Abnormal weight gain: Secondary | ICD-10-CM | POA: Diagnosis present

## 2017-10-04 DIAGNOSIS — F0390 Unspecified dementia without behavioral disturbance: Secondary | ICD-10-CM | POA: Diagnosis present

## 2017-10-04 DIAGNOSIS — J42 Unspecified chronic bronchitis: Secondary | ICD-10-CM | POA: Insufficient documentation

## 2017-10-04 LAB — CBC WITH DIFFERENTIAL/PLATELET
BASOS ABS: 0 10*3/uL (ref 0.0–0.1)
BASOS PCT: 0 %
EOS ABS: 0.1 10*3/uL (ref 0.0–0.7)
Eosinophils Relative: 2 %
HCT: 43.4 % (ref 36.0–46.0)
Hemoglobin: 13.7 g/dL (ref 12.0–15.0)
Lymphocytes Relative: 43 %
Lymphs Abs: 2.4 10*3/uL (ref 0.7–4.0)
MCH: 29.5 pg (ref 26.0–34.0)
MCHC: 31.6 g/dL (ref 30.0–36.0)
MCV: 93.5 fL (ref 78.0–100.0)
MONO ABS: 0.6 10*3/uL (ref 0.1–1.0)
MONOS PCT: 10 %
NEUTROS PCT: 45 %
Neutro Abs: 2.5 10*3/uL (ref 1.7–7.7)
Platelets: 177 10*3/uL (ref 150–400)
RBC: 4.64 MIL/uL (ref 3.87–5.11)
RDW: 14.3 % (ref 11.5–15.5)
WBC: 5.6 10*3/uL (ref 4.0–10.5)

## 2017-10-04 LAB — BASIC METABOLIC PANEL
ANION GAP: 12 (ref 5–15)
BUN: 14 mg/dL (ref 6–20)
CALCIUM: 9.2 mg/dL (ref 8.9–10.3)
CO2: 29 mmol/L (ref 22–32)
CREATININE: 0.81 mg/dL (ref 0.44–1.00)
Chloride: 98 mmol/L — ABNORMAL LOW (ref 101–111)
GFR calc non Af Amer: >60 mL/min (ref >60)
Glucose, Bld: 107 mg/dL — ABNORMAL HIGH (ref 65–99)
Potassium: 3.8 mmol/L (ref 3.5–5.1)
Sodium: 139 mmol/L (ref 135–145)

## 2017-10-14 ENCOUNTER — Non-Acute Institutional Stay (SKILLED_NURSING_FACILITY): Payer: Medicare Other | Admitting: Internal Medicine

## 2017-10-14 ENCOUNTER — Encounter: Payer: Self-pay | Admitting: Internal Medicine

## 2017-10-14 DIAGNOSIS — J410 Simple chronic bronchitis: Secondary | ICD-10-CM | POA: Diagnosis not present

## 2017-10-14 DIAGNOSIS — E785 Hyperlipidemia, unspecified: Secondary | ICD-10-CM | POA: Diagnosis not present

## 2017-10-14 DIAGNOSIS — M810 Age-related osteoporosis without current pathological fracture: Secondary | ICD-10-CM | POA: Diagnosis not present

## 2017-10-14 DIAGNOSIS — F418 Other specified anxiety disorders: Secondary | ICD-10-CM

## 2017-10-14 DIAGNOSIS — J42 Unspecified chronic bronchitis: Secondary | ICD-10-CM | POA: Insufficient documentation

## 2017-10-14 DIAGNOSIS — G40909 Epilepsy, unspecified, not intractable, without status epilepticus: Secondary | ICD-10-CM

## 2017-10-14 DIAGNOSIS — I10 Essential (primary) hypertension: Secondary | ICD-10-CM

## 2017-10-14 NOTE — Progress Notes (Signed)
Location:   [facility] Room Number: 145/D Place of Service:  SNF (31) Provider:  Valrie Hart, MD  Patient Care Team: Kerri Perches, MD as PCP - General [place] Bali, MD (Gastroenterology)  Extended Emergency Contact Information Primary Emergency Contact: Kopko,Alfreda Address: [address]          [name], [place] [address] [place] Home Phone: [phone] Mobile Phone: [phone] Relation: Daughter Secondary Emergency Contact: Vivien Presto [place] Mobile Phone: [phone] Relation: Son  Code Status:  Full Code Goals of care: Advanced Directive information Advanced Directives 10/14/2017  Does Patient Have a Medical Advance Directive? Yes  Type of Advance Directive (No Data)  Does patient want to make changes to medical advance directive? No - Patient declined  Copy of Healthcare Power of Attorney in Chart? No - copy requested  Would patient like information on creating a medical advance directive? No - Patient declined  Pre-existing out of facility DNR order (yellow form or pink MOST form) -     Chief Complaint  Patient presents with  . Medical Management of Chronic Issues    Routine Visit    HPI:  [name] seen today for medical management of chronic diseases.    Patient has h/o Hypertension, Seizure disorder, Anemia, Chronic Venous stasis, and Arthritis, Chronic Bronchitis, Osteoporosis s/p Femoral neck fracture And depression Patient was recently seen for bronchitis.  Her Chest Xray was negative. She responded well to nebulizers.  She says her cough is better and denied any shortness of breath. Her weight is stable at around 265 pounds.  She has no new nursing issues. She did wanted if her medication for insomnia increase in the dose. She also wanted to see the ophthalmologist to get new glasses.   Past Medical History:  Diagnosis Date  .  Diverticulosis OCT 2010 TCS   SIGMOID COLON  . Esophageal web 2010   DILATION 16 MM  . Gastritis, Helicobacter pylori 2010   Rx. ABO for 10 days   . GERD (gastroesophageal reflux disease)   . Hip fracture, left (HCC)    2016  . Hyperlipidemia   . Hypertension   . Restless legs syndrome   . Seizures [facility]) January 2010   Post head trauma in fall .sees Dr Gerilyn Pilgrim   Past Surgical History:  Procedure Laterality Date  . ABDOMINAL HYSTERECTOMY    . COLONOSCOPY  OCT 2010   TORTUOUS, Sml IH, Tics, MULTIPLE SIMPLE ADENOMAS (<6MM)  . HIP ARTHROPLASTY Left 11/19/2014   Procedure: LEFT PARTIAL HIP REPLACEMENT;  Surgeon: Vickki Hearing, MD;  Location: [facility];  Service: Orthopedics;  Laterality: Left;  . ORIF HIP FRACTURE  04/05/2012   Procedure: OPEN REDUCTION INTERNAL FIXATION HIP;  Surgeon: Vickki Hearing, MD;  Location: [facility];  Service: Orthopedics;  Laterality: Right;  . UPPER GASTROINTESTINAL ENDOSCOPY  AUG 2010   SAVARY  . VESICOVAGINAL FISTULA CLOSURE W/ TAH  1966   bleeding     No Known Allergies  Outpatient Encounter Medications as of 10/14/2017  Medication Sig  . acetaminophen (TYLENOL) 650 MG CR tablet Take 1,300 mg by mouth daily.  Marland Kitchen alendronate (FOSAMAX) 70 MG tablet Take with full glass of water once a day on Monday  . ammonium lactate (LAC-HYDRIN) 12 % lotion Apply to right knee once a day  . aspirin 81 MG chewable tablet Chew 81 mg by mouth daily.  . calcium-vitamin  D (OSCAL WITH D) 500-200 MG-UNIT TABS TAKE (1) TABLET BY MOUTH (3) TIMES DAILY WITH MEALS.  Marland Kitchen docusate sodium (COLACE) 100 MG capsule Take 200 mg by mouth daily.   Marland Kitchen FLUoxetine (PROZAC) 10 MG tablet Take 1 tablet 10 mg by mouth along with 20 mg to equal 30 mg once a day  . FLUoxetine (PROZAC) 20 MG tablet Take 1 tablet 20 mg along with 10 mg to equal 30 mg by mouth once a day  . hydrochlorothiazide (HYDRODIURIL) 12.5 MG tablet Take 12.5 mg by mouth daily.  Marland Kitchen HYDROcodone-acetaminophen (NORCO/VICODIN)  5-325 MG tablet Take 1 tablet every 8 (eight) hours as needed by mouth for moderate pain.  Marland Kitchen ipratropium-albuterol (DUONEB) 0.5-2.5 (3) MG/3ML SOLN Take 3 mLs by nebulization every 6 (six) hours as needed.  . levETIRAcetam (KEPPRA) 500 MG tablet TAKE 1 TABLET BY MOUTH TWICE A DAY AS DIRECTED.  Marland Kitchen lovastatin (MEVACOR) 20 MG tablet Take 1 tablet (20 mg total) by mouth at bedtime.  . Melatonin 3 MG TABS Take 3 mg by mouth at bedtime.  . Multiple Vitamin (MULTIVITAMIN) tablet Take 1 tablet by mouth daily.  . polyethylene glycol (MIRALAX / GLYCOLAX) packet Take 17 g by mouth daily.  . potassium chloride (K-DUR,KLOR-CON) 10 MEQ tablet Take 10 mEq by mouth daily.  Marland Kitchen senna (SENOKOT) 8.6 MG TABS tablet Take 2 tablets by mouth at bedtime.   Marland Kitchen tiotropium (SPIRIVA) 18 MCG inhalation capsule Give 2 puffs once a day   No facility-administered encounter medications on file as of 10/14/2017.      Review of Systems  Constitutional: Negative.   HENT: Negative.   Respiratory: Negative for cough and shortness of breath.   Cardiovascular: Positive for leg swelling.  Gastrointestinal: Positive for constipation. Negative for abdominal distention and abdominal pain.  Genitourinary: Positive for urgency.  Skin: Negative.   Neurological: Negative.   Psychiatric/Behavioral: Positive for sleep disturbance.    Immunization History  Administered Date(s) Administered  . Influenza Split 07/25/2012  . Influenza,inj,Quad PF,6+ Mos 07/30/2013, 08/18/2014  . Influenza-Unspecified 07/06/2016, 07/12/2017  . PPD Test 12/06/2014  . Pneumococcal Conjugate-13 04/29/2014  . Pneumococcal Polysaccharide-23 07/25/2012  . Tdap 08/08/2011   Pertinent  Health Maintenance Due  Topic Date Due  . INFLUENZA VACCINE  Completed  . DEXA SCAN  Completed  . PNA vac Low Risk Adult  Completed   Fall Risk  07/04/2017 04/29/2014 07/30/2013  Falls in the past year? No Yes Yes  Number falls in past yr: - 1 2 or more  Injury with Fall? -  No -  Risk for fall due to : - - Impaired balance/gait;Impaired mobility;History of fall(s)   Functional Status Survey:    Vitals:   10/14/17 1258  BP: (!) 144/76  Pulse: 70  Resp: 20  Temp: (!) 97.4 F (36.3 C)  TempSrc: Oral  SpO2: 98%  Weight: 265 lb (120.2 kg)  Height: 5\' 7"  (1.702 m)   Body mass index is 41.5 kg/m. Physical Exam  Constitutional: She appears well-developed and well-nourished.  HENT:  Head: Normocephalic.  Mouth/Throat: Oropharynx is clear and moist.  Eyes: Pupils are equal, round, and reactive to light.  Neck: Neck supple.  Cardiovascular: Normal rate and normal heart sounds.  Pulmonary/Chest: Effort normal and breath sounds normal. No respiratory distress. She has no wheezes. She has no rales.  Abdominal: Soft. Bowel sounds are normal. She exhibits no distension. There is no tenderness. There is no rebound.  Musculoskeletal:  Edema Bilateral  Lymphadenopathy:  She has no cervical adenopathy.  Neurological: She is alert.  Skin: Skin is warm and dry.  Psychiatric: Her affect is blunt. Her speech is delayed. She is slowed.    Labs reviewed: Recent Labs    04/15/17 0900 06/19/17 0702 10/04/17 0515  NA 141 141 139  K 3.8 4.4 3.8  CL 99* 100* 98*  CO2 33* 34* 29  GLUCOSE 95 94 107*  BUN 20 16 14   CREATININE 0.75 0.80 0.81  CALCIUM 8.5* 8.8* 9.2   Recent Labs    02/05/17 0700 06/19/17 0702  AST 25 22  ALT 18 18  ALKPHOS 58 55  BILITOT 0.7 0.5  PROT 7.6 6.8  ALBUMIN 4.1 3.6   Recent Labs    10/17/16 0500  02/05/17 0700 06/19/17 0702 10/04/17 0515  WBC 4.7   < > 5.8 5.6 5.6  NEUTROABS 2.1  --  2.7  --  2.5  HGB 12.7   < > 14.3 12.6 13.7  HCT 38.2   < > 43.3 37.7 43.4  MCV 92.3   < > 93.3 92.9 93.5  PLT 163   < > 158 165 177   < > = values in this interval not displayed.   Lab Results  Component Value Date   TSH 1.590 06/19/2017   Lab Results  Component Value Date   HGBA1C 5.5 07/25/2017   Lab Results  Component  Value Date   CHOL 121 06/19/2017   HDL 43 06/19/2017   LDLCALC 67 06/19/2017   TRIG 54 06/19/2017   CHOLHDL 2.8 06/19/2017    Significant Diagnostic Results in last 30 days:  No results found.  Assessment/Plan  Essential hypertension Patient blood pressure mildly elevated again today.  We will continue to monitor. If it stays elevated can increase the HCTZ.  Simple chronic bronchitis  Resolved with nebulizers We will continue her on Spiriva  Seizure disorder .  Stable on Keppra  Osteoporosis, senile Patient os on Chronic Fosamax therapy. . It seems since 2014. Serum Calcium Normal. Repeat Dexa scan showed T score of -1.655 Continue Fosamax for another year and then she will get 5 years of therapy.  Depression with anxiety Stable on Prozac.  Follows with psych services  Hyperlipidemia, LDL was 67 in 09/18 Oh continue lovastatin.    Constipation On senna and Colace  Insomnia  will increase the dose of melatonin to 5 mg  Family/ staff Communication:   Labs/tests ordered:    Total time spent in this patient care encounter was _25 minutes; greater than 50% of the visit spent counseling patient, reviewing records , Labs and coordinating care for problems addressed at this encounter.

## 2017-11-12 ENCOUNTER — Encounter: Payer: Self-pay | Admitting: Internal Medicine

## 2017-11-12 ENCOUNTER — Non-Acute Institutional Stay (SKILLED_NURSING_FACILITY): Payer: Medicare Other | Admitting: Internal Medicine

## 2017-11-12 DIAGNOSIS — M1990 Unspecified osteoarthritis, unspecified site: Secondary | ICD-10-CM | POA: Diagnosis not present

## 2017-11-12 DIAGNOSIS — G40909 Epilepsy, unspecified, not intractable, without status epilepticus: Secondary | ICD-10-CM

## 2017-11-12 DIAGNOSIS — M810 Age-related osteoporosis without current pathological fracture: Secondary | ICD-10-CM | POA: Diagnosis not present

## 2017-11-12 DIAGNOSIS — I10 Essential (primary) hypertension: Secondary | ICD-10-CM

## 2017-11-12 DIAGNOSIS — J410 Simple chronic bronchitis: Secondary | ICD-10-CM

## 2017-11-12 NOTE — Progress Notes (Signed)
Location:   [facility] Room Number: 145/D Place of Service:  SNF (31) Provider:  Odessa FlemingArlo, Belinda Bringhurst  Simpson, Margaret E, MD  Patient Care Team: Kerri PerchesSimpson, Margaret E, MD as PCP - General [place] BaliFields, Sandi L, MD (Gastroenterology)  Extended Emergency Contact Information Primary Emergency Contact: Buckner,Alfreda Address: [address]          [name], [place] [address] [place] Home Phone: [phone] Mobile Phone: [phone] Relation: Daughter Secondary Emergency Contact: Vivien PrestoMiller,Alfred  [place] Mobile Phone: [phone] Relation: Son  Code Status:  Full Code Goals of care: Advanced Directive information Advanced Directives 11/12/2017  Does Patient Have a Medical Advance Directive? Yes  Type of Advance Directive (No Data)  Does patient want to make changes to medical advance directive? No - Patient declined  Copy of Healthcare Power of Attorney in Chart? No - copy requested  Would patient like information on creating a medical advance directive? No - Patient declined  Pre-existing out of facility DNR order (yellow form or pink MOST form) -     Chief Complaint  Patient presents with  . Medical Management of Chronic Issues    Routine Visit   For medical management of chronic medical issues including chronic bronchitis-seizure disorder-hypertension-anemia-chronic venous stasis changes osteoarthritis- osteoporosis-depression- HPI:  [name] seen today as noted above.  She appears to be quite stable.  She was seen recently for some increased wheezing episodes chest x-ray was negative she responded well to nebulizers she is also on Spriva  She also has a history of osteoarthritis especially of her right shoulder and legs-she does receive Tylenol arthritis and apparently this is helping she is not really complaining of joint pain today.  She has a history of osteoporosis with a previous history of a femoral  neck fracture she is on Fosamax as well as calcium recommendation is to continue Fosamax for approximately another year.  She also has a history of hypertension she is on hydrochlorothiazide with potassium supplementation blood pressure appears to be relatively stable at 112/70 occasionally she will have some systolic spikes a bit higher I see recent 142/79 do not see consistent elevations however this will have to be monitored.  She does have a history of seizure disorder but this is been stable for an extended period of time she is on Keppra.  He also has a listed history of anemia but hemoglobin has been quite stable for an extended period of time it was 13.7 on lab done in late December.  She has complained of insomnia in the past Dr. Chales AbrahamsGupta did increase her melatonin and this appears to be helping.  Today she is sitting in her wheelchair comfortably she has no complaints she often sits in the hall visiting with other residents and passersby.  She is on Paxil for history of depression which appears to be fairly stable-she is followed by psych services  .     Past Medical History:  Diagnosis Date  . Diverticulosis OCT 2010 TCS   SIGMOID COLON  . Esophageal web 2010   DILATION 16 MM  . Gastritis, Helicobacter pylori 2010   Rx. ABO for 10 days   . GERD (gastroesophageal reflux disease)   . Hip fracture, left (HCC)    2016  . Hyperlipidemia   . Hypertension   . Restless legs syndrome   . Seizures [facility](HCC) January 2010   Post head trauma in fall .sees Dr Gerilyn Pilgrimoonquah   Past  Surgical History:  Procedure Laterality Date  . ABDOMINAL HYSTERECTOMY    . COLONOSCOPY  OCT 2010   TORTUOUS, Sml IH, Tics, MULTIPLE SIMPLE ADENOMAS (<6MM)  . HIP ARTHROPLASTY Left 11/19/2014   Procedure: LEFT PARTIAL HIP REPLACEMENT;  Surgeon: Vickki Hearing, MD;  Location: [facility];  Service: Orthopedics;  Laterality: Left;  . ORIF HIP FRACTURE  04/05/2012   Procedure: OPEN REDUCTION INTERNAL FIXATION HIP;   Surgeon: Vickki Hearing, MD;  Location: [facility];  Service: Orthopedics;  Laterality: Right;  . UPPER GASTROINTESTINAL ENDOSCOPY  AUG 2010   SAVARY  . VESICOVAGINAL FISTULA CLOSURE W/ TAH  1966   bleeding     No Known Allergies  Outpatient Encounter Medications as of 11/12/2017  Medication Sig  . acetaminophen (TYLENOL) 650 MG CR tablet Take 1,300 mg by mouth daily.  Marland Kitchen alendronate (FOSAMAX) 70 MG tablet Take with full glass of water once a day on Monday  . ammonium lactate (LAC-HYDRIN) 12 % lotion Apply to right knee once a day  . aspirin 81 MG chewable tablet Chew 81 mg by mouth daily.  . calcium-vitamin D (OSCAL WITH D) 500-200 MG-UNIT TABS TAKE (1) TABLET BY MOUTH (3) TIMES DAILY WITH MEALS.  Marland Kitchen docusate sodium (COLACE) 100 MG capsule Take 200 mg by mouth every evening.   Marland Kitchen FLUoxetine (PROZAC) 10 MG tablet Take 1 tablet 10 mg by mouth along with 20 mg to equal 30 mg once a day  . FLUoxetine (PROZAC) 20 MG tablet Take 1 tablet 20 mg along with 10 mg to equal 30 mg by mouth once a day  . hydrochlorothiazide (HYDRODIURIL) 12.5 MG tablet Take 12.5 mg by mouth daily.  Marland Kitchen ipratropium-albuterol (DUONEB) 0.5-2.5 (3) MG/3ML SOLN Take 3 mLs by nebulization every 6 (six) hours as needed.  . levETIRAcetam (KEPPRA) 500 MG tablet TAKE 1 TABLET BY MOUTH TWICE A DAY AS DIRECTED.  Marland Kitchen lovastatin (MEVACOR) 20 MG tablet Take 1 tablet (20 mg total) by mouth at bedtime.  . Melatonin 5 MG TABS Take 5 mg by mouth at bedtime.  . Multiple Vitamin (MULTIVITAMIN) tablet Take 1 tablet by mouth daily.  . polyethylene glycol (MIRALAX / GLYCOLAX) packet Take 17 g by mouth daily.  . potassium chloride (K-DUR,KLOR-CON) 10 MEQ tablet Take 10 mEq by mouth daily.  Marland Kitchen senna (SENOKOT) 8.6 MG TABS tablet Take 2 tablets by mouth at bedtime.   Marland Kitchen tiotropium (SPIRIVA) 18 MCG inhalation capsule Give 2 puffs once a day  . [DISCONTINUED] HYDROcodone-acetaminophen (NORCO/VICODIN) 5-325 MG tablet Take 1 tablet every 8 (eight) hours as  needed by mouth for moderate pain.  . [DISCONTINUED] Melatonin 3 MG TABS Take 3 mg by mouth at bedtime.   No facility-administered encounter medications on file as of 11/12/2017.      Review of Systems   In general she not complaining of any fever chills has gained a small amount of weight appears about 3 pounds over the past several weeks.  Skin does not complain of rashes or itching.  Head ears eyes nose mouth and throat has prescription lenses is not complaining of visual changes or sore throat.  Respiratory is not complaining of shortness of breath or cough has had some wheezing in the past.  Cardiac does not complain of chest pain has chronic venous stasis edema which appears relatively baseline.  GI is not complaining of abdominal discomfort nausea vomiting diarrhea or constipation had complained of constipation in the past but is not complaining of that this afternoon.  GU does not complain of dysuria.  Musculoskeletal does have a history of osteoarthritis especially her right shoulder and legs but is not complaining of pain today.  Neurologic does not complain of dizziness headache or syncope does have a history of seizures but has been quite sometime since she has had one it appears.  Psych she does not complain of overt anxiety or depression she is being treated for depression no behaviors have been noted    Immunization History  Administered Date(s) Administered  . Influenza Split 07/25/2012  . Influenza,inj,Quad PF,6+ Mos 07/30/2013, 08/18/2014  . Influenza-Unspecified 07/06/2016, 07/12/2017  . PPD Test 12/06/2014  . Pneumococcal Conjugate-13 04/29/2014  . Pneumococcal Polysaccharide-23 07/25/2012  . Tdap 08/08/2011   Pertinent  Health Maintenance Due  Topic Date Due  . INFLUENZA VACCINE  Completed  . DEXA SCAN  Completed  . PNA vac Low Risk Adult  Completed   Fall Risk  07/04/2017 04/29/2014 07/30/2013  Falls in the past year? No Yes Yes  Number falls in past  yr: - 1 2 or more  Injury with Fall? - No -  Risk for fall due to : - - Impaired balance/gait;Impaired mobility;History of fall(s)   Functional Status Survey:    Vitals:   11/12/17 1249  BP: 112/70  Pulse: 72  Resp: 20  Temp: 98.6 F (37 C)  TempSrc: Oral  SpO2: 98%  Weight: 271 lb 8 oz (123.2 kg)  Height: 5\' 7"  (1.702 m)   Body mass index is 42.52 kg/m. Physical Exam   In general this is a well-nourished elderly female in no distress sitting comfortably in her wheelchair.  Her skin is warm and dry.  Eyes visual acuity appears grossly intact she has prescription lenses.  Oropharynx is clear mucous membranes moist.  Chest is clear to auscultation with somewhat shallow air entry there is no labored breathing.  Heart is regular rate and rhythm without murmur gallop or rub she has baseline bilateral lower extremity edema venous stasis changes but this does not appear to be increased from previous exams.  Abdomen is somewhat obese soft nontender with positive bowel sounds.  Musculoskeletal does move all extremities x4 she will occasionally use a walker with restorative strength appears to be intact all 4 extremities she is able to raise her arms bilaterally does not complain of shoulder pain today.  Neurologic is grossly intact her speech is clear no lateralizing findings.  Psych she is oriented to self she is pleasant appropriate does have somewhat delayed speech slowed speech but does respond appropriately most of the time Labs reviewed: Recent Labs    04/15/17 0900 06/19/17 0702 10/04/17 0515  NA 141 141 139  K 3.8 4.4 3.8  CL 99* 100* 98*  CO2 33* 34* 29  GLUCOSE 95 94 107*  BUN 20 16 14   CREATININE 0.75 0.80 0.81  CALCIUM 8.5* 8.8* 9.2   Recent Labs    02/05/17 0700 06/19/17 0702  AST 25 22  ALT 18 18  ALKPHOS 58 55  BILITOT 0.7 0.5  PROT 7.6 6.8  ALBUMIN 4.1 3.6   Recent Labs    02/05/17 0700 06/19/17 0702 10/04/17 0515  WBC 5.8 5.6 5.6    NEUTROABS 2.7  --  2.5  HGB 14.3 12.6 13.7  HCT 43.3 37.7 43.4  MCV 93.3 92.9 93.5  PLT 158 165 177   Lab Results  Component Value Date   TSH 1.590 06/19/2017   Lab Results  Component Value Date   HGBA1C  5.5 07/25/2017   Lab Results  Component Value Date   CHOL 121 06/19/2017   HDL 43 06/19/2017   LDLCALC 67 06/19/2017   TRIG 54 06/19/2017   CHOLHDL 2.8 06/19/2017    Significant Diagnostic Results in last 30 days:  No results found.  Assessment/Plan   #1 history of osteoarthritis this appears under decent control she does receive Tylenol arthritis in the morning which appears to help-at this point will monitor.  2.  Hypertension at this point appears fairly stable except some variable systolics which is normal for her- at this point will monitor she is on hydrochlorothiazide with potassium.  3.  History of simple chronic bronchitis this appears stable she does have duo nebs as needed and is also on Spiriva.  4.  History of seizure disorder this has been stable for a considerable amount of time she is on Keppra.  5.  History of osteoporosis she is on chronic Fosamax  most recommendation is continue Fosamax for about another year most -recent DEXA score showed a T score of -1.655--to complete 5 years of therapy.  6.  History of depression with anxiety this is stable on Prozac she is followed by psychiatric services.  7.  Insomnia her melatonin appears to be effective at this point.  8.  Hyperlipidemia LDL was 67 on lab done in September 2018 will monitor periodically she is on lovastatin.  9.  History of chronic venous stasis edema this appears baseline she has had some mild weight gain but this may be a scale variation at this point will monitor she does have a pretty good appetite it appears.  10.  History of anemia this is quite stable with a hemoglobin of 13.7 on lab done in late December 2018.  [id number]

## 2018-01-30 ENCOUNTER — Non-Acute Institutional Stay (SKILLED_NURSING_FACILITY): Payer: Medicare Other | Admitting: Internal Medicine

## 2018-01-30 ENCOUNTER — Encounter: Payer: Self-pay | Admitting: Internal Medicine

## 2018-01-30 DIAGNOSIS — F418 Other specified anxiety disorders: Secondary | ICD-10-CM | POA: Diagnosis not present

## 2018-01-30 DIAGNOSIS — J410 Simple chronic bronchitis: Secondary | ICD-10-CM

## 2018-01-30 DIAGNOSIS — M810 Age-related osteoporosis without current pathological fracture: Secondary | ICD-10-CM

## 2018-01-30 DIAGNOSIS — E785 Hyperlipidemia, unspecified: Secondary | ICD-10-CM

## 2018-01-30 DIAGNOSIS — G40909 Epilepsy, unspecified, not intractable, without status epilepticus: Secondary | ICD-10-CM

## 2018-01-30 DIAGNOSIS — I10 Essential (primary) hypertension: Secondary | ICD-10-CM

## 2018-01-30 NOTE — Progress Notes (Signed)
Location:   [facility] Room Number: 145/D Place of Service:  SNF (31) Provider:  Valrie Cruz, Alejandra  Cruz, Alejandra Cruz, Alejandra Cruz  Patient Care Team: Alejandra PerchesSimpson, Alejandra Cruz, Alejandra Cruz as PCP - General Alejandra BaliFields, Alejandra L, Alejandra Cruz (Gastroenterology)  Extended Emergency Contact Information Primary Emergency Contact: Endicott,Alejandra Cruz Address: [address]          [place], [place] [address] [place] Home Phone: [phone] Mobile Phone: [phone] Relation: Daughter Secondary Emergency Contact: Alejandra PrestoMiller,Alejandra Cruz  [place] Mobile Phone: [phone] Relation: Son  Code Status:  Full Code Goals of care: Advanced Directive information Advanced Directives 01/30/2018  Does Patient Have a Medical Advance Directive? Yes  Type of Advance Directive (No Data)  Does patient want to make changes to medical advance directive? No - Patient declined  Copy of Healthcare Power of Attorney in Chart? No - copy requested  Would patient like information on creating a medical advance directive? No - Patient declined  Pre-existing out of facility DNR order (yellow form or pink MOST form) -     Chief Complaint  Patient presents with  . Medical Management of Chronic Issues    Routine Visit    HPI:  [name] seen today for medical management of chronic diseases.   Patient has h/o Hypertension, Seizure disorder, Anemia, Chronic Venous stasis, and Arthritis, Chronic Bronchitis, Osteoporosis s/p Femoral neck fracture And depression Patient is a long-term resident of  Facility.  Today her main complaint was some wheezing that she is noticed since yesterday with dry cough.  Denies any shortness of breath chest pain fever or chills. She has gained around 10 pounds since my last visit and is now up to 275 pounds. Per nurses there no new issues.  Her insomnia is resolved with Melatonin.  Past Medical History:  Diagnosis Date  . Diverticulosis OCT 2010 TCS   SIGMOID COLON  . Esophageal web 2010   DILATION 16 MM  . Gastritis, Helicobacter pylori 2010   Rx. ABO for 10 days   . GERD (gastroesophageal reflux disease)   . Hip fracture, left (HCC)    2016  . Hyperlipidemia   . Hypertension   . Restless legs syndrome   . Seizures [facility](HCC) January 2010   Cruz head trauma in fall .sees Dr Gerilyn Pilgrimoonquah   Past Surgical History:  Procedure Laterality Date  . ABDOMINAL HYSTERECTOMY    . COLONOSCOPY  OCT 2010   TORTUOUS, Sml IH, Tics, MULTIPLE SIMPLE ADENOMAS (<6MM)  . HIP ARTHROPLASTY Left 11/19/2014   Procedure: LEFT PARTIAL HIP REPLACEMENT;  Surgeon: Vickki HearingStanley Cruz Harrison, Alejandra Cruz;  Location: [facility];  Service: Orthopedics;  Laterality: Left;  . ORIF HIP FRACTURE  04/05/2012   Procedure: OPEN REDUCTION INTERNAL FIXATION HIP;  Surgeon: Vickki HearingStanley Cruz Harrison, Alejandra Cruz;  Location: [facility];  Service: Orthopedics;  Laterality: Right;  . UPPER GASTROINTESTINAL ENDOSCOPY  AUG 2010   SAVARY  . VESICOVAGINAL FISTULA CLOSURE W/ TAH  1966   bleeding     No Known Allergies  Outpatient Encounter Medications as of 01/30/2018  Medication Sig  . acetaminophen (TYLENOL) 650 MG CR tablet Take 1,300 mg by mouth daily.  Marland Kitchen. alendronate (FOSAMAX) 70 MG tablet Take with full glass of water once a day on Monday  . ammonium lactate (LAC-HYDRIN) 12 % lotion Apply to right knee once a day  . aspirin 81 MG chewable tablet Chew 81 mg by mouth daily.  . calcium-vitamin D (OSCAL WITH D) 500-200  MG-UNIT TABS TAKE (1) TABLET BY MOUTH (3) TIMES DAILY WITH MEALS.  Marland Kitchen docusate sodium (COLACE) 100 MG capsule Take 200 mg by mouth every evening.   Marland Kitchen FLUoxetine (PROZAC) 10 MG tablet Take 1 tablet 10 mg by mouth along with 20 mg to equal 30 mg once a day  . FLUoxetine (PROZAC) 20 MG tablet Take 1 tablet 20 mg along with 10 mg to equal 30 mg by mouth once a day  . hydrochlorothiazide (HYDRODIURIL) 12.5 MG tablet Take 12.5 mg by mouth daily.  Marland Kitchen ipratropium-albuterol (DUONEB) 0.5-2.5 (3) MG/3ML SOLN Take 3 mLs by  nebulization every 6 (six) hours as needed.  . levETIRAcetam (KEPPRA) 500 MG tablet TAKE 1 TABLET BY MOUTH TWICE A DAY AS DIRECTED.  Marland Kitchen lovastatin (MEVACOR) 20 MG tablet Take 1 tablet (20 mg total) by mouth at bedtime.  . Melatonin 5 MG TABS Take 5 mg by mouth at bedtime.  . Multiple Vitamin (MULTIVITAMIN) tablet Take 1 tablet by mouth daily.  . polyethylene glycol (MIRALAX / GLYCOLAX) packet Take 17 g by mouth daily.  . potassium chloride (K-DUR,KLOR-CON) 10 MEQ tablet Take 10 mEq by mouth daily.  Marland Kitchen senna (SENOKOT) 8.6 MG TABS tablet Take 2 tablets by mouth at bedtime.   Marland Kitchen tiotropium (SPIRIVA) 18 MCG inhalation capsule Give 2 puffs once a day   No facility-administered encounter medications on file as of 01/30/2018.      Review of Systems  Review of Systems  Constitutional: Negative for activity change, appetite change, chills, diaphoresis, fatigue and fever.  HENT: Negative for mouth sores, postnasal drip, rhinorrhea, sinus pain and sore throat.   Respiratory: Negative for apnea, cough, chest tightness, shortness of breath   Cardiovascular: Negative for chest pain, palpitations and leg swelling.  Gastrointestinal: Negative for abdominal distention, abdominal pain, constipation, diarrhea, nausea and vomiting.  Genitourinary: Negative for dysuria and frequency.  Musculoskeletal: Negative for arthralgias, joint swelling and myalgias.  Skin: Negative for rash.  Neurological: Negative for dizziness, syncope, weakness, light-headedness and numbness.  Psychiatric/Behavioral: Negative for behavioral problems, confusion and sleep disturbance.     Immunization History  Administered Date(s) Administered  . Influenza Split 07/25/2012  . Influenza,inj,Quad PF,6+ Mos 07/30/2013, 08/18/2014  . Influenza-Unspecified 07/06/2016, 07/12/2017  . PPD Test 12/06/2014  . Pneumococcal Conjugate-13 04/29/2014  . Pneumococcal Polysaccharide-23 07/25/2012  . Tdap 08/08/2011   Pertinent  Health  Maintenance Due  Topic Date Due  . INFLUENZA VACCINE  05/08/2018  . DEXA SCAN  Completed  . PNA vac Low Risk Adult  Completed   Fall Risk  07/04/2017 04/29/2014 07/30/2013  Falls in the past year? No Yes Yes  Number falls in past yr: - 1 2 or more  Injury with Fall? - No -  Risk for fall due to : - - Impaired balance/gait;Impaired mobility;History of fall(s)   Functional Status Survey:    Vitals:   01/30/18 1219  BP: 133/77  Pulse: 68  Resp: 20  Temp: 98 F (36.7 C)  TempSrc: Other (Comment)  SpO2: 95%  Weight: 275 lb (124.7 kg)  Height: 5\' 7"  (1.702 m)   Body mass index is 43.07 kg/m. Physical Exam  Constitutional: She appears well-developed and well-nourished.  HENT:  Head: Normocephalic.  Mouth/Throat: Oropharynx is clear and moist.  Eyes: Pupils are equal, round, and reactive to light.  Neck: Normal range of motion.  Cardiovascular: Normal rate and regular rhythm.  No murmur heard. Pulmonary/Chest: Effort normal. No respiratory distress. She has wheezes. She has no rales.  Has Expiratory Wheezing  Abdominal: Soft. Bowel sounds are normal. She exhibits no distension. There is no tenderness. There is no guarding.  Musculoskeletal:  Trace edema Bilateral  Lymphadenopathy:    She has no cervical adenopathy.  Neurological: She is alert.  Skin: Skin is warm and dry.  Psychiatric: She has a normal mood and affect. Thought content normal.    Labs reviewed: Recent Labs    04/15/17 0900 06/19/17 0702 10/04/17 0515  NA 141 141 139  K 3.8 4.4 3.8  CL 99* 100* 98*  CO2 33* 34* 29  GLUCOSE 95 94 107*  BUN 20 16 14   CREATININE 0.75 0.80 0.81  CALCIUM 8.5* 8.8* 9.2   Recent Labs    02/05/17 0700 06/19/17 0702  AST 25 22  ALT 18 18  ALKPHOS 58 55  BILITOT 0.7 0.5  PROT 7.6 6.8  ALBUMIN 4.1 3.6   Recent Labs    02/05/17 0700 06/19/17 0702 10/04/17 0515  WBC 5.8 5.6 5.6  NEUTROABS 2.7  --  2.5  HGB 14.3 12.6 13.7  HCT 43.3 37.7 43.4  MCV 93.3 92.9  93.5  PLT 158 165 177   Lab Results  Component Value Date   TSH 1.590 06/19/2017   Lab Results  Component Value Date   HGBA1C 5.5 07/25/2017   Lab Results  Component Value Date   CHOL 121 06/19/2017   HDL 43 06/19/2017   LDLCALC 67 06/19/2017   TRIG 54 06/19/2017   CHOLHDL 2.8 06/19/2017    Significant Diagnostic Results in last 30 days:  No results found.  Assessment/Plan  Simple chronic bronchitis  Will give her one DuoNeb now and started on Symbicort twice a day Continue on Spiriva  Essential hypertension Blood pressure stable on HCTZ Repeat BMP  Seizure disorder .Stable on Keppra  Osteoporosis, senile Patient os on Chronic Fosamax therapy. . It seems since 2014. Serum Calcium Normal. Repeat Dexa scan showed T score of -1.655 Continue Fosamax for another year and then she will get 5 years of therapy.  Depression with anxiety Stable on Prozac.  Follows with psych services  Hyperlipidemia, LDL was 67 in 09/18  continue lovastatin.  Repeat fasting lipid profile   Constipation On senna and Colace  Insomnia  Doing well on melatonin 5 mg    Family/ staff Communication:   Labs/tests ordered:

## 2018-01-31 ENCOUNTER — Encounter (HOSPITAL_COMMUNITY)
Admission: RE | Admit: 2018-01-31 | Discharge: 2018-01-31 | Disposition: A | Discharge status: Home / Self Care | Payer: Medicare Other | Source: Skilled Nursing Facility | Attending: Internal Medicine | Admitting: Internal Medicine

## 2018-01-31 DIAGNOSIS — F0390 Unspecified dementia without behavioral disturbance: Secondary | ICD-10-CM | POA: Insufficient documentation

## 2018-01-31 DIAGNOSIS — R635 Abnormal weight gain: Secondary | ICD-10-CM | POA: Diagnosis present

## 2018-01-31 DIAGNOSIS — J42 Unspecified chronic bronchitis: Secondary | ICD-10-CM | POA: Insufficient documentation

## 2018-01-31 DIAGNOSIS — F429 Obsessive-compulsive disorder, unspecified: Secondary | ICD-10-CM | POA: Insufficient documentation

## 2018-01-31 DIAGNOSIS — F3289 Other specified depressive episodes: Secondary | ICD-10-CM | POA: Diagnosis not present

## 2018-01-31 DIAGNOSIS — F482 Pseudobulbar affect: Secondary | ICD-10-CM | POA: Diagnosis present

## 2018-01-31 LAB — BASIC METABOLIC PANEL
Anion gap: 13 (ref 5–15)
BUN: 17 mg/dL (ref 6–20)
CHLORIDE: 99 mmol/L — AB (ref 101–111)
CO2: 28 mmol/L (ref 22–32)
CREATININE: 0.73 mg/dL (ref 0.44–1.00)
Calcium: 8.8 mg/dL — ABNORMAL LOW (ref 8.9–10.3)
GFR calc non Af Amer: >60 mL/min (ref >60)
Glucose, Bld: 96 mg/dL (ref 65–99)
POTASSIUM: 3.9 mmol/L (ref 3.5–5.1)
SODIUM: 140 mmol/L (ref 135–145)

## 2018-01-31 LAB — CBC WITH DIFFERENTIAL/PLATELET
BASOS ABS: 0 10*3/uL (ref 0.0–0.1)
BASOS PCT: 0 %
EOS ABS: 0.2 10*3/uL (ref 0.0–0.7)
Eosinophils Relative: 4 %
HEMATOCRIT: 38.7 % (ref 36.0–46.0)
HEMOGLOBIN: 12.4 g/dL (ref 12.0–15.0)
Lymphocytes Relative: 36 %
Lymphs Abs: 1.8 10*3/uL (ref 0.7–4.0)
MCH: 29.6 pg (ref 26.0–34.0)
MCHC: 32 g/dL (ref 30.0–36.0)
MCV: 92.4 fL (ref 78.0–100.0)
MONOS PCT: 13 %
Monocytes Absolute: 0.6 10*3/uL (ref 0.1–1.0)
NEUTROS ABS: 2.4 10*3/uL (ref 1.7–7.7)
NEUTROS PCT: 47 %
Platelets: 160 10*3/uL (ref 150–400)
RBC: 4.19 MIL/uL (ref 3.87–5.11)
RDW: 14 % (ref 11.5–15.5)
WBC: 5 10*3/uL (ref 4.0–10.5)

## 2018-01-31 LAB — HEMOGLOBIN A1C
HEMOGLOBIN A1C: 5.5 % (ref 4.8–5.6)
MEAN PLASMA GLUCOSE: 111.15 mg/dL

## 2018-02-24 ENCOUNTER — Non-Acute Institutional Stay (SKILLED_NURSING_FACILITY): Payer: Medicare Other | Admitting: Internal Medicine

## 2018-02-24 ENCOUNTER — Encounter: Payer: Self-pay | Admitting: Internal Medicine

## 2018-02-24 DIAGNOSIS — J410 Simple chronic bronchitis: Secondary | ICD-10-CM

## 2018-02-24 DIAGNOSIS — M810 Age-related osteoporosis without current pathological fracture: Secondary | ICD-10-CM

## 2018-02-24 DIAGNOSIS — E785 Hyperlipidemia, unspecified: Secondary | ICD-10-CM

## 2018-02-24 DIAGNOSIS — I10 Essential (primary) hypertension: Secondary | ICD-10-CM | POA: Diagnosis not present

## 2018-02-24 DIAGNOSIS — F418 Other specified anxiety disorders: Secondary | ICD-10-CM

## 2018-02-24 DIAGNOSIS — G40909 Epilepsy, unspecified, not intractable, without status epilepticus: Secondary | ICD-10-CM | POA: Diagnosis not present

## 2018-02-24 NOTE — Progress Notes (Signed)
This is a routine visit.  Level care skilled.  Facility is [facility].  Chief complaint-routine visit for medical management of chronic medical conditions includingChronic bronchitis-seizure disorder-hypertension-anemia-chronic venous stasis-osteoarthritis as well as osteoporosis and depression   History of present illness.  Patient is an 84 year old female seen for follow-up of chronic medical conditions as noted above.  She continues to have a period of stability she was noted to have had some weight gain on last visit but this appears to have moderated her weight is down about 5 pounds at 269 pounds.  Symbicort also was added to her medications for history of chronic bronchitis with wheezing- this appears to be helping she is also on Spiriva and DuoNeb's as needed.  Her other medical issues appear to be stable.  She has a history of osteoporosis and a history of a femoral neck fracture in the past she continues on Fosamax she has been on this properly for years and the plan is for her to continue for 1 additional year-she is also on calcium supplementation.  I note her vitamin D level done back in September was 31.1 we will update this.  She also has a history of hypertension on hydrochlorothiazide with potassium supplementation recent renal panel showed stability-blood pressure taken manually today was 130/80.  She also has a history of anemia but this is been stable with hemoglobin 12.4 on lab done late last month.  She does have a history of chronic osteoarthritic pain and continues on Tylenol arthritis routinely in the morning which appears to be effective.  Regards to seizure disorder this is been stable for a considerable amount of time she is on Keppra.  She also is receiving melatonin for insomnia which appears to be effective.  Currently she is sitting in her wheelchair she has no acute complaints I did note she does have a recurrent bunion on her right second toe  and suspect she would benefit from seeing podiatry.  Past Medical History:  Diagnosis Date  . Diverticulosis OCT 2010 TCS   SIGMOID COLON  . Esophageal web 2010   DILATION 16 MM  . Gastritis, Helicobacter pylori 2010   Rx. ABO for 10 days   . GERD (gastroesophageal reflux disease)   . Hip fracture, left (HCC)    2016  . Hyperlipidemia   . Hypertension   . Restless legs syndrome   . Seizures [facility]) January 2010   Post head trauma in fall .sees Dr Gerilyn Pilgrim        Past Surgical History:  Procedure Laterality Date  . ABDOMINAL HYSTERECTOMY    . COLONOSCOPY  OCT 2010   TORTUOUS, Sml IH, Tics, MULTIPLE SIMPLE ADENOMAS (<6MM)  . HIP ARTHROPLASTY Left 11/19/2014   Procedure: LEFT PARTIAL HIP REPLACEMENT;  Surgeon: Vickki Hearing, MD;  Location: [facility];  Service: Orthopedics;  Laterality: Left;  . ORIF HIP FRACTURE  04/05/2012   Procedure: OPEN REDUCTION INTERNAL FIXATION HIP;  Surgeon: Vickki Hearing, MD;  Location: [facility];  Service: Orthopedics;  Laterality: Right;  . UPPER GASTROINTESTINAL ENDOSCOPY  AUG 2010   SAVARY  . VESICOVAGINAL FISTULA CLOSURE W/ TAH  1966   bleeding     No Known Allergies         Medication Sig  . acetaminophen (TYLENOL) 650 MG CR tablet Take 1,300 mg by mouth daily.  Marland Kitchen alendronate (FOSAMAX) 70 MG tablet Take with full glass of water once a day on Monday  . ammonium lactate (LAC-HYDRIN) 12 % lotion  Apply to right knee once a day  . aspirin 81 MG chewable tablet Chew 81 mg by mouth daily.  . calcium-vitamin D (OSCAL WITH D) 500-200 MG-UNIT TABS TAKE (1) TABLET BY MOUTH (3) TIMES DAILY WITH MEALS.  Marland Kitchen docusate sodium (COLACE) 100 MG capsule Take 200 mg by mouth every evening.   Marland Kitchen FLUoxetine (PROZAC) 10 MG tablet Take 1 tablet 10 mg by mouth along with 20 mg to equal 30 mg once a day  . FLUoxetine (PROZAC) 20 MG tablet Take 1 tablet 20 mg along with 10 mg to equal 30 mg by mouth once a day  . hydrochlorothiazide  (HYDRODIURIL) 12.5 MG tablet Take 12.5 mg by mouth daily.  Marland Kitchen ipratropium-albuterol (DUONEB) 0.5-2.5 (3) MG/3ML SOLN Take 3 mLs by nebulization every 6 (six) hours as needed.  . levETIRAcetam (KEPPRA) 500 MG tablet TAKE 1 TABLET BY MOUTH TWICE A DAY AS DIRECTED.  Marland Kitchen lovastatin (MEVACOR) 20 MG tablet Take 1 tablet (20 mg total) by mouth at bedtime.  . Melatonin 5 MG TABS Take 5 mg by mouth at bedtime.  . Multiple Vitamin (MULTIVITAMIN) tablet Take 1 tablet by mouth daily.  . polyethylene glycol (MIRALAX / GLYCOLAX) packet Take 17 g by mouth daily.  . potassium chloride (K-DUR,KLOR-CON) 10 MEQ tablet Take 10 mEq by mouth daily.  Marland Kitchen senna (SENOKOT) 8.6 MG TABS tablet Take 2 tablets by mouth at bedtime.   Marland Kitchen tiotropium (SPIRIVA) 18 MCG inhalation capsule Give 2 puffs once a day   No facility-administered encounter medications on file as of 01/30/2018.     Review of systems.  In general she is not complaining of any fever or chills weight appears to be stabilized.  Skin is not complaining of rashes or itching does complain of a bunion on her right foot.  Head ears eyes nose mouth and throat has prescription lenses is not complaining of visual changes or sore throat or difficulty swallowing.  Respiratory does not complain of shortness of breath or cough has chronic bronchitis with intermittent wheezing which appears to have improved.  Cardiac is not complaining of chest pain palpitations or increased venous stasis edema from baseline.  GI is not complaining of abdominal pain nausea vomiting diarrhea constipation.  GU does not complain of dysuria.  Musculoskeletal does have a history of chronic osteoarthritic discomfort but is not complaining of back currently-at times has complained of shoulder pain in the past.  Neurologic is not complaining of dizziness headache or syncope.  Psych does not complain of overt anxiety or depression does have some history of depression is on Paxil  Physical  exam.  She is afebrile pulse is 80 respirations 18 blood pressure taken manually 130/80 weight is 269.5 pounds  In general this is a somewhat obese elderly female in no distress sitting comfortably in her wheelchair.  Her skin is warm and dry.  Eyes visual acuity appears grossly intact she has prescription lenses somewhat limited exam since patient tends to shut her eyelids tightly with attemp on exam   Oropharynx is clear mucous membranes moist.  Chest is clear to auscultation with somewhat shallow air entry I could not really appreciate wheezing there is no labored breathing.  Abdomen is soft nontender with positive bowel sounds.  Musculoskeletal does move all extremities at baseline ambulates largely in a wheelchair at time will use a walker with restorative- I also noted on her right second toe there appears to be a bunion- there also some scaling on her left heel.  Neurologic is grossly intact her speech is clear no lateralizing findings.  Psych she is oriented to self does have some cognitive deficits but is largely appropriate.  Labs.  January 31, 2018.  Hemoglobin A1c 5.5.  Sodium 140 potassium 3.9 BUN 17--- creatinine 0.73.  WBC 5.0 hemoglobin 12.4 platelets 160.  September 18, 2018.  Vitamin D level 31.1.  LDL 67.  Liver function tests within normal limits  Assessment plan.  1.  Chronic bronchitis this appears stable Symbicort recently added and appears to be helping with the wheezing she is also on Spiriva and has duo nebs as needed.  2.  History ofHypertension this appears stable on hydrochlorothiazide with potassium supplementation recent metabolic panel showed stability at this point will monitor.  3.  History of seizure disorder this is been stable for an extended period of time on Keppra.  4.  History of osteoporosis she is on chronic Fosamax therapy apparently since 2014-DEXA scan showed T score -1.655.  We will continue Fosamax for approximately 1  more year to complete 5 years of therapy.  Continues on calcium as well will get a vitamin D level updated.  5.-History of hyperlipidemia LDL was 67 on lab done in September 2018 she is on lovastatin will update a lipid panel.  6.  History of depression with anxiety she is on Prozac and followed by psych services this appears to be stable.  7.  History of insomnia and this appears stabilized on her melatonin.  8.  History of anemia this appears stable with hemoglobin 12.4 on lab done late last month.  9.  History of osteoarthritic pain she is on Tylenol arthritis in the morning this appears to be helping.  10.  Constipation she is on senna and Colace this apparently is effective there have been no complaints of constipation recently to my knowledge.  11.  History of right second toe bunion will write for podiatry follow-up.  Clinically she appears stable again will update a vitamin D level as well as a metabolic panel to keep an eye on her liver function tests also will update a fasting lipid panel next lab day.  [id number]

## 2018-02-25 ENCOUNTER — Encounter (HOSPITAL_COMMUNITY)
Admission: RE | Admit: 2018-02-25 | Discharge: 2018-02-25 | Disposition: A | Discharge status: Home / Self Care | Payer: Medicare Other | Source: Skilled Nursing Facility | Attending: Internal Medicine | Admitting: Internal Medicine

## 2018-02-25 DIAGNOSIS — F0390 Unspecified dementia without behavioral disturbance: Secondary | ICD-10-CM | POA: Insufficient documentation

## 2018-02-25 DIAGNOSIS — F429 Obsessive-compulsive disorder, unspecified: Secondary | ICD-10-CM | POA: Insufficient documentation

## 2018-02-25 DIAGNOSIS — F482 Pseudobulbar affect: Secondary | ICD-10-CM | POA: Insufficient documentation

## 2018-02-25 DIAGNOSIS — R635 Abnormal weight gain: Secondary | ICD-10-CM | POA: Insufficient documentation

## 2018-02-25 DIAGNOSIS — I10 Essential (primary) hypertension: Secondary | ICD-10-CM | POA: Diagnosis not present

## 2018-02-25 DIAGNOSIS — F3289 Other specified depressive episodes: Secondary | ICD-10-CM | POA: Diagnosis present

## 2018-02-25 DIAGNOSIS — J42 Unspecified chronic bronchitis: Secondary | ICD-10-CM | POA: Diagnosis present

## 2018-02-25 LAB — COMPREHENSIVE METABOLIC PANEL
ALBUMIN: 4.2 g/dL (ref 3.5–5.0)
ALT: 19 U/L (ref 14–54)
ANION GAP: 9 (ref 5–15)
AST: 26 U/L (ref 15–41)
Alkaline Phosphatase: 66 U/L (ref 38–126)
BUN: 26 mg/dL — ABNORMAL HIGH (ref 6–20)
CO2: 30 mmol/L (ref 22–32)
Calcium: 8.9 mg/dL (ref 8.9–10.3)
Chloride: 103 mmol/L (ref 101–111)
Creatinine, Ser: 0.86 mg/dL (ref 0.44–1.00)
GFR calc Af Amer: >60 mL/min (ref >60)
GFR calc non Af Amer: >60 mL/min (ref >60)
GLUCOSE: 103 mg/dL — AB (ref 65–99)
Potassium: 3.8 mmol/L (ref 3.5–5.1)
Sodium: 142 mmol/L (ref 135–145)
Total Bilirubin: 0.6 mg/dL (ref 0.3–1.2)
Total Protein: 7.7 g/dL (ref 6.5–8.1)

## 2018-02-25 LAB — LIPID PANEL
CHOL/HDL RATIO: 2.8 ratio
Cholesterol: 140 mg/dL (ref 0–200)
HDL: 50 mg/dL (ref >40)
LDL Cholesterol: 76 mg/dL (ref 0–99)
Triglycerides: 70 mg/dL (ref <150)
VLDL: 14 mg/dL (ref 0–40)

## 2018-02-26 LAB — VITAMIN D 25 HYDROXY (VIT D DEFICIENCY, FRACTURES): Vit D, 25-Hydroxy: 38.5 ng/mL (ref 30.0–100.0)

## 2018-03-06 ENCOUNTER — Ambulatory Visit: Payer: Medicare Other | Admitting: Podiatry

## 2018-03-06 ENCOUNTER — Inpatient Hospital Stay: Payer: Medicare Other

## 2018-03-06 ENCOUNTER — Encounter: Payer: Self-pay | Admitting: Podiatry

## 2018-03-06 ENCOUNTER — Other Ambulatory Visit: Payer: Self-pay

## 2018-03-06 VITALS — BP 145/86 | HR 74

## 2018-03-06 DIAGNOSIS — M79674 Pain in right toe(s): Principal | ICD-10-CM

## 2018-03-06 DIAGNOSIS — M2040 Other hammer toe(s) (acquired), unspecified foot: Secondary | ICD-10-CM

## 2018-03-06 DIAGNOSIS — L84 Corns and callosities: Secondary | ICD-10-CM | POA: Diagnosis not present

## 2018-03-06 DIAGNOSIS — G8929 Other chronic pain: Secondary | ICD-10-CM

## 2018-03-06 NOTE — Progress Notes (Signed)
Subjective:   Patient ID: Alejandra Cruz, female   DOB: 84 y.o.   MRN: [id number]   HPI Patient presents in wheelchair with a painful second digit right lesion formation and thick lesion formation posterior aspect left heel that sore   ROS      Objective:  Physical Exam  Neurovascular status intact with digital deformity digit to right with keratotic lesion and lesion formation posterior plantar aspect left heel     Assessment:  Patient is found to have chronic digital deformity and also chronic lesion formation posterior left heel     Plan:  Reviewed conditions and debrided lesions today along with discussing hammertoe deformity second right.  Applied ankle silicone to try to cushion the back of the heel and will be seen back as needed

## 2018-03-12 ENCOUNTER — Encounter (HOSPITAL_COMMUNITY)
Admission: RE | Admit: 2018-03-12 | Discharge: 2018-03-12 | Disposition: A | Discharge status: Home / Self Care | Payer: Medicare Other | Source: Skilled Nursing Facility | Attending: Internal Medicine | Admitting: Internal Medicine

## 2018-03-12 DIAGNOSIS — I10 Essential (primary) hypertension: Secondary | ICD-10-CM | POA: Diagnosis not present

## 2018-03-12 DIAGNOSIS — F429 Obsessive-compulsive disorder, unspecified: Secondary | ICD-10-CM | POA: Insufficient documentation

## 2018-03-12 DIAGNOSIS — F3289 Other specified depressive episodes: Secondary | ICD-10-CM | POA: Diagnosis not present

## 2018-03-12 DIAGNOSIS — R635 Abnormal weight gain: Secondary | ICD-10-CM | POA: Diagnosis present

## 2018-03-12 DIAGNOSIS — F482 Pseudobulbar affect: Secondary | ICD-10-CM | POA: Insufficient documentation

## 2018-03-12 DIAGNOSIS — J42 Unspecified chronic bronchitis: Secondary | ICD-10-CM | POA: Diagnosis not present

## 2018-03-12 DIAGNOSIS — F0390 Unspecified dementia without behavioral disturbance: Secondary | ICD-10-CM | POA: Insufficient documentation

## 2018-03-12 LAB — BASIC METABOLIC PANEL
Anion gap: 6 (ref 5–15)
BUN: 16 mg/dL (ref 6–20)
CALCIUM: 9 mg/dL (ref 8.9–10.3)
CO2: 33 mmol/L — ABNORMAL HIGH (ref 22–32)
CREATININE: 0.79 mg/dL (ref 0.44–1.00)
Chloride: 102 mmol/L (ref 101–111)
GFR calc non Af Amer: >60 mL/min (ref >60)
Glucose, Bld: 98 mg/dL (ref 65–99)
Potassium: 3.9 mmol/L (ref 3.5–5.1)
Sodium: 141 mmol/L (ref 135–145)

## 2018-03-27 ENCOUNTER — Encounter: Payer: Self-pay | Admitting: Internal Medicine

## 2018-03-27 ENCOUNTER — Non-Acute Institutional Stay (SKILLED_NURSING_FACILITY): Payer: Medicare Other | Admitting: Internal Medicine

## 2018-03-27 DIAGNOSIS — R599 Enlarged lymph nodes, unspecified: Secondary | ICD-10-CM | POA: Diagnosis not present

## 2018-03-27 DIAGNOSIS — G40909 Epilepsy, unspecified, not intractable, without status epilepticus: Secondary | ICD-10-CM | POA: Diagnosis not present

## 2018-03-27 DIAGNOSIS — I10 Essential (primary) hypertension: Secondary | ICD-10-CM | POA: Diagnosis not present

## 2018-03-27 DIAGNOSIS — M810 Age-related osteoporosis without current pathological fracture: Secondary | ICD-10-CM

## 2018-03-27 DIAGNOSIS — J410 Simple chronic bronchitis: Secondary | ICD-10-CM | POA: Diagnosis not present

## 2018-03-27 NOTE — Progress Notes (Signed)
Location:   [facility] Room Number: 145/D Place of Service:  SNF [phone]) Provider:  Valrie Hart, MD  Patient Care Team: Kerri Perches, MD as PCP - General West Bali, MD (Gastroenterology)  Extended Emergency Contact Information Primary Emergency Contact: [facility] Address: [address]          [name], [place] [address] [place] Home Phone: [phone] Mobile Phone: [phone] Relation: Daughter Secondary Emergency Contact: Vivien Presto [place] Mobile Phone: [phone] Relation: Son  Code Status:  Full Code Goals of care: Advanced Directive information Advanced Directives 01/30/2018  Does Patient Have a Medical Advance Directive? Yes  Type of Advance Directive (No Data)  Does patient want to make changes to medical advance directive? No - Patient declined  Copy of Healthcare Power of Attorney in Chart? No - copy requested  Would patient like information on creating a medical advance directive? No - Patient declined  Pre-existing out of facility DNR order (yellow form or pink MOST form) -     Chief Complaint  Patient presents with  . Acute Visit    Patient is being seen for Lump felt by Nurses in Left Axillary    HPI:  [name] seen today for an acute visit as Nurses had felt something in her Left Axillary area.  Patient has h/o Hypertension, Seizure disorder, Anemia, Chronic Venous stasis, and Arthritis, Chronic Bronchitis, Osteoporosis s/p Femoral neck fracture And depression Patient is long term resident of facility. Nursing aide had noticed something in her Axilla while giving her Bath. Patient denies any pain.  She has been stable otherwise with no c/o Fever , Cough or Chest pain. Her appetite is good.  Past Medical History:  Diagnosis Date  . Diverticulosis OCT 2010 TCS   SIGMOID COLON  . Esophageal web 2010   DILATION 16 MM  . Gastritis,  Helicobacter pylori 2010   Rx. ABO for 10 days   . GERD (gastroesophageal reflux disease)   . Hip fracture, left (HCC)    2016  . Hyperlipidemia   . Hypertension   . Restless legs syndrome   . Seizures [facility]) January 2010   Post head trauma in fall .sees Dr Gerilyn Pilgrim   Past Surgical History:  Procedure Laterality Date  . ABDOMINAL HYSTERECTOMY    . COLONOSCOPY  OCT 2010   TORTUOUS, Sml IH, Tics, MULTIPLE SIMPLE ADENOMAS (<6MM)  . HIP ARTHROPLASTY Left 11/19/2014   Procedure: LEFT PARTIAL HIP REPLACEMENT;  Surgeon: Vickki Hearing, MD;  Location: [facility];  Service: Orthopedics;  Laterality: Left;  . ORIF HIP FRACTURE  04/05/2012   Procedure: OPEN REDUCTION INTERNAL FIXATION HIP;  Surgeon: Vickki Hearing, MD;  Location: [facility];  Service: Orthopedics;  Laterality: Right;  . UPPER GASTROINTESTINAL ENDOSCOPY  AUG 2010   SAVARY  . VESICOVAGINAL FISTULA CLOSURE W/ TAH  1966   bleeding     No Known Allergies  Outpatient Encounter Medications as of 03/27/2018  Medication Sig  . acetaminophen (TYLENOL) 650 MG CR tablet Take 1,300 mg by mouth daily.  Marland Kitchen alendronate (FOSAMAX) 70 MG tablet Take with full glass of water once a day on Monday  . ammonium lactate (LAC-HYDRIN) 12 % lotion Apply to right knee once a day  . aspirin 81 MG chewable tablet Chew 81 mg by mouth daily.  . calcium-vitamin D (OSCAL WITH D) 500-200 MG-UNIT TABS TAKE (1) TABLET BY MOUTH (3) TIMES  DAILY WITH MEALS.  Marland Kitchen docusate sodium (COLACE) 100 MG capsule Take 200 mg by mouth every evening.   Marland Kitchen FLUoxetine (PROZAC) 10 MG tablet Take 1 tablet 10 mg by mouth along with 20 mg to equal 30 mg once a day  . FLUoxetine (PROZAC) 20 MG tablet Take 1 tablet 20 mg along with 10 mg to equal 30 mg by mouth once a day  . hydrochlorothiazide (HYDRODIURIL) 12.5 MG tablet Take 12.5 mg by mouth daily.  Marland Kitchen ipratropium-albuterol (DUONEB) 0.5-2.5 (3) MG/3ML SOLN Take 3 mLs by nebulization every 6 (six) hours as needed.  . levETIRAcetam (KEPPRA)  500 MG tablet TAKE 1 TABLET BY MOUTH TWICE A DAY AS DIRECTED.  Marland Kitchen lovastatin (MEVACOR) 20 MG tablet Take 1 tablet (20 mg total) by mouth at bedtime.  . Melatonin 5 MG TABS Take 5 mg by mouth at bedtime.  . Multiple Vitamin (MULTIVITAMIN) tablet Take 1 tablet by mouth daily.  . polyethylene glycol (MIRALAX / GLYCOLAX) packet Take 17 g by mouth daily.  . potassium chloride (K-DUR,KLOR-CON) 10 MEQ tablet Take 10 mEq by mouth daily.  Marland Kitchen senna (SENOKOT) 8.6 MG TABS tablet Take 2 tablets by mouth at bedtime.   . SYMBICORT 80-4.5 MCG/ACT inhaler Inhale 2 puffs into the lungs 2 (two) times daily.   Marland Kitchen tiotropium (SPIRIVA) 18 MCG inhalation capsule Give 2 puffs once a day   No facility-administered encounter medications on file as of 03/27/2018.      Review of Systems  Review of Systems  Constitutional: Negative for activity change, appetite change, chills, diaphoresis, fatigue and fever.  HENT: Negative for mouth sores, postnasal drip, rhinorrhea, sinus pain and sore throat.   Respiratory: Negative for apnea, cough, chest tightness, shortness of breath and wheezing.   Cardiovascular: Negative for chest pain, palpitations and leg swelling.  Gastrointestinal: Negative for abdominal distention, abdominal pain, constipation, diarrhea, nausea and vomiting.  Genitourinary: Negative for dysuria and frequency.  Musculoskeletal: Negative for arthralgias, joint swelling and myalgias.  Skin: Negative for rash.  Neurological: Negative for dizziness, syncope, weakness, light-headedness and numbness.  Psychiatric/Behavioral: Negative for behavioral problems, confusion and sleep disturbance.     Immunization History  Administered Date(s) Administered  . Influenza Split 07/25/2012  . Influenza,inj,Quad PF,6+ Mos 07/30/2013, 08/18/2014  . Influenza-Unspecified 07/06/2016, 07/12/2017  . PPD Test 12/06/2014  . Pneumococcal Conjugate-13 04/29/2014  . Pneumococcal Polysaccharide-23 07/25/2012  . Tdap 08/08/2011     Pertinent  Health Maintenance Due  Topic Date Due  . INFLUENZA VACCINE  05/08/2018  . DEXA SCAN  Completed  . PNA vac Low Risk Adult  Completed   Fall Risk  07/04/2017 04/29/2014 07/30/2013  Falls in the past year? No Yes Yes  Number falls in past yr: - 1 2 or more  Injury with Fall? - No -  Risk for fall due to : - - Impaired balance/gait;Impaired mobility;History of fall(s)   Functional Status Survey:    Vitals:   03/27/18 0944  BP: (!) 152/83  Pulse: 71  Resp: 20  Temp: 97.8 F (36.6 C)  TempSrc: Oral   There is no height or weight on file to calculate BMI. Physical Exam  Constitutional Well-developed and well-nourished.  HENT:  Head: Normocephalic.  Mouth/Throat: Oropharynx is clear and moist.  Eyes: Pupils are equal, round, and reactive to light.  Neck: Neck supple.  Breast Exam Bilateral . Did not Feel any Lumps. Nipple and Skin were Normal. Left Axillary Felt a small Lymph node in the center. Not tender and  Movable. Cardiovascular: Normal rate and normal heart sounds.  No murmur heard. Pulmonary/Chest: Effort normal and breath sounds normal. No respiratory distress. No wheezes. She has no rales.  Abdominal: Soft. Bowel sounds are normal. No distension. There is no tenderness. There is no rebound.  Musculoskeletal: Trace Bilateral edema.  Lymphadenopathy: none Neurological: Alert  Skin: Skin is warm and dry.  Psychiatric: Normal mood and affect. Behavior is normal. Thought content normal.    Labs reviewed: Recent Labs    01/31/18 0741 02/25/18 0707 03/12/18 0758  NA 140 142 141  K 3.9 3.8 3.9  CL 99* 103 102  CO2 28 30 33*  GLUCOSE 96 103* 98  BUN 17 26* 16  CREATININE 0.73 0.86 0.79  CALCIUM 8.8* 8.9 9.0   Recent Labs    06/19/17 0702 02/25/18 0707  AST 22 26  ALT 18 19  ALKPHOS 55 66  BILITOT 0.5 0.6  PROT 6.8 7.7  ALBUMIN 3.6 4.2   Recent Labs    06/19/17 0702 10/04/17 0515 01/31/18 0741  WBC 5.6 5.6 5.0  NEUTROABS  --  2.5 2.4   HGB 12.6 13.7 12.4  HCT 37.7 43.4 38.7  MCV 92.9 93.5 92.4  PLT 165 177 160   Lab Results  Component Value Date   TSH 1.590 06/19/2017   Lab Results  Component Value Date   HGBA1C 5.5 01/31/2018   Lab Results  Component Value Date   CHOL 140 02/25/2018   HDL 50 02/25/2018   LDLCALC 76 02/25/2018   TRIG 70 02/25/2018   CHOLHDL 2.8 02/25/2018    Significant Diagnostic Results in last 30 days:  No results found.  Assessment/Plan  Small Lymph node in Left Axilla with Normal Breast Exam Will Monitor patient for next few weeks.  If it Persists will consider further Imaging Simple chronic bronchitis Has been Stable on Symbicort and Spiriva Essential hypertension Blood pressure stable on HCTZ Labs Stable  Seizure disorder .Stable on Keppra  Osteoporosis, senile Patient os on Chronic Fosamax therapy. . It seems since 2014. Serum Calcium Normal. Repeat Dexa scan showed T score of -1.655 Continue Fosamax for another year and then she will get 5 years of therapy.  Depression with anxiety Stable on Prozac. Follows with psych services  Hyperlipidemia, LDL was 76 in05/19  continue lovastatin.  Constipation On senna and Colace  Insomnia  Doing well on melatonin 5 mg   Family/ staff Communication:  Total time spent in this patient care encounter was 25_ minutes; greater than 50% of the visit spent counseling patient, reviewing records , Labs and coordinating care for problems addressed at this encounter.   Labs/tests ordered:

## 2018-05-20 ENCOUNTER — Encounter: Payer: Self-pay | Admitting: Internal Medicine

## 2018-05-20 NOTE — Progress Notes (Signed)
Location:    [facility] Room Number: 145/D Place of Service:  SNF (31) Provider: Edmon CrapeArlo Lassen PA-C  Kerri PerchesSimpson, Margaret E, MD  Patient Care Team: Kerri PerchesSimpson, Margaret E, MD as PCP - [facility] [place] BaliFields, Sandi L, MD (Gastroenterology)  Extended Emergency Contact Information Primary Emergency Contact: [facility],Alfreda Address: [address]          [name], [place] [address] [place] Home Phone: [phone] Mobile Phone: [phone] Relation: Daughter Secondary Emergency Contact: Vivien PrestoMiller,Alfred  [place] Mobile Phone: [phone] Relation: Son  Code Status: Full Code Goals of care: Advanced Directive information Advanced Directives 05/20/2018  Does Patient Have a Medical Advance Directive? Yes  Type of Advance Directive (No Data)  Does patient want to make changes to medical advance directive? No - Patient declined  Copy of Healthcare Power of Attorney in Chart? No - copy requested  Would patient like information on creating a medical advance directive? No - Patient declined  Pre-existing out of facility DNR order (yellow form or pink MOST form) -     Chief Complaint  Patient presents with  . Medical Management of Chronic Issues    Patient is being seen for routine visit of medical management    HPI:  [name] seen today for medical management of chronic diseases.     Past Medical History:  Diagnosis Date  . Diverticulosis OCT 2010 TCS   SIGMOID COLON  . Esophageal web 2010   DILATION 16 MM  . Gastritis, Helicobacter pylori 2010   Rx. ABO for 10 days   . GERD (gastroesophageal reflux disease)   . Hip fracture, left (HCC)    2016  . Hyperlipidemia   . Hypertension   . Restless legs syndrome   . Seizures [facility](HCC) January 2010   Post head trauma in fall .sees Dr [place]ilyn [place]   Past Surgical History:  Procedure Laterality Date  . ABDOMINAL HYSTERECTOMY    . COLONOSCOPY  OCT 2010   TORTUOUS, Sml IH,  Tics, MULTIPLE SIMPLE ADENOMAS (<6MM)  . HIP ARTHROPLASTY Left 11/19/2014   Procedure: LEFT PARTIAL HIP REPLACEMENT;  Surgeon: Vickki HearingStanley E Harrison, MD;  Location: [facility];  Service: Orthopedics;  Laterality: Left;  . ORIF HIP FRACTURE  04/05/2012   Procedure: OPEN REDUCTION INTERNAL FIXATION HIP;  Surgeon: Vickki HearingStanley E Harrison, MD;  Location: [facility];  Service: Orthopedics;  Laterality: Right;  . UPPER GASTROINTESTINAL ENDOSCOPY  AUG 2010   SAVARY  . VESICOVAGINAL FISTULA CLOSURE W/ TAH  1966   bleeding     No Known Allergies  Outpatient Encounter Medications as of 05/20/2018  Medication Sig  . acetaminophen (TYLENOL) 650 MG CR tablet Take 1,300 mg by mouth daily.  Marland Kitchen. alendronate (FOSAMAX) 70 MG tablet Take with full glass of water once a day on Monday  . ammonium lactate (LAC-HYDRIN) 12 % lotion Apply to right knee once a day  . aspirin 81 MG chewable tablet Chew 81 mg by mouth daily.  . calcium-vitamin D (OSCAL WITH D) 500-200 MG-UNIT TABS TAKE (1) TABLET BY MOUTH (3) TIMES DAILY WITH MEALS.  Marland Kitchen. docusate sodium (COLACE) 100 MG capsule Take 200 mg by mouth every evening.   Marland Kitchen. FLUoxetine (PROZAC) 10 MG tablet Take 1 tablet 10 mg by mouth along with 20 mg to equal 30 mg once a day  . FLUoxetine (PROZAC) 20 MG tablet Take 1 tablet 20 mg along with 10 mg to equal 30 mg by mouth once a  day  . hydrochlorothiazide (HYDRODIURIL) 12.5 MG tablet Take 12.5 mg by mouth daily.  Marland Kitchen. ipratropium-albuterol (DUONEB) 0.5-2.5 (3) MG/3ML SOLN Take 3 mLs by nebulization every 6 (six) hours as needed.  . levETIRAcetam (KEPPRA) 500 MG tablet TAKE 1 TABLET BY MOUTH TWICE A DAY AS DIRECTED.  Marland Kitchen. lovastatin (MEVACOR) 20 MG tablet Take 1 tablet (20 mg total) by mouth at bedtime.  . Melatonin 5 MG TABS Take 5 mg by mouth at bedtime.  . Multiple Vitamin (MULTIVITAMIN) tablet Take 1 tablet by mouth daily.  . polyethylene glycol (MIRALAX / GLYCOLAX) packet Take 17 g by mouth daily.  . potassium chloride (K-DUR,KLOR-CON) 10 MEQ  tablet Take 10 mEq by mouth daily.  Marland Kitchen. senna (SENOKOT) 8.6 MG TABS tablet Take 2 tablets by mouth at bedtime.   . SYMBICORT 80-4.5 MCG/ACT inhaler Inhale 2 puffs into the lungs 2 (two) times daily.   Marland Kitchen. tiotropium (SPIRIVA) 18 MCG inhalation capsule Give 2 puffs once a day   No facility-administered encounter medications on file as of 05/20/2018.      Review of Systems  Immunization History  Administered Date(s) Administered  . Influenza Split 07/25/2012  . Influenza,inj,Quad PF,6+ Mos 07/30/2013, 08/18/2014  . Influenza-Unspecified 07/06/2016, 07/12/2017  . PPD Test 12/06/2014  . Pneumococcal Conjugate-13 04/29/2014  . Pneumococcal Polysaccharide-23 07/25/2012  . Tdap 08/08/2011   Pertinent  Health Maintenance Due  Topic Date Due  . INFLUENZA VACCINE  06/20/2018 (Originally 05/08/2018)  . DEXA SCAN  Completed  . PNA vac Low Risk Adult  Completed   Fall Risk  07/04/2017 04/29/2014 07/30/2013  Falls in the past year? No Yes Yes  Number falls in past yr: - 1 2 or more  Injury with Fall? - No -  Risk for fall due to : - - Impaired balance/gait;Impaired mobility;History of fall(s)   Functional Status Survey:    Vitals:   05/20/18 1434  BP: 124/60  Pulse: 76  Resp: 16  Temp: 97.8 F (36.6 C)  TempSrc: Oral  SpO2: 95%  Weight: 270 lb (122.5 kg)  Height: 5\' 7"  (1.702 m)   Body mass index is 42.29 kg/m. Physical Exam  Labs reviewed: Recent Labs    01/31/18 0741 02/25/18 0707 03/12/18 0758  NA 140 142 141  K 3.9 3.8 3.9  CL 99* 103 102  CO2 28 30 33*  GLUCOSE 96 103* 98  BUN 17 26* 16  CREATININE 0.73 0.86 0.79  CALCIUM 8.8* 8.9 9.0   Recent Labs    06/19/17 0702 02/25/18 0707  AST 22 26  ALT 18 19  ALKPHOS 55 66  BILITOT 0.5 0.6  PROT 6.8 7.7  ALBUMIN 3.6 4.2   Recent Labs    06/19/17 0702 10/04/17 0515 01/31/18 0741  WBC 5.6 5.6 5.0  NEUTROABS  --  2.5 2.4  HGB 12.6 13.7 12.4  HCT 37.7 43.4 38.7  MCV 92.9 93.5 92.4  PLT 165 177 160   Lab  Results  Component Value Date   TSH 1.590 06/19/2017   Lab Results  Component Value Date   HGBA1C 5.5 01/31/2018   Lab Results  Component Value Date   CHOL 140 02/25/2018   HDL 50 02/25/2018   LDLCALC 76 02/25/2018   TRIG 70 02/25/2018   CHOLHDL 2.8 02/25/2018    Significant Diagnostic Results in last 30 days:  No results found.  Assessment/Plan There are no diagnoses linked to this encounter.   Family/ staff Communication:   Labs/tests ordered:

## 2018-05-21 NOTE — Progress Notes (Signed)
This encounter was created in error - please disregard.

## 2018-05-22 ENCOUNTER — Encounter: Payer: Self-pay | Admitting: Internal Medicine

## 2018-05-22 ENCOUNTER — Non-Acute Institutional Stay (SKILLED_NURSING_FACILITY): Payer: Medicare Other | Admitting: Internal Medicine

## 2018-05-22 DIAGNOSIS — G40909 Epilepsy, unspecified, not intractable, without status epilepticus: Secondary | ICD-10-CM | POA: Diagnosis not present

## 2018-05-22 DIAGNOSIS — I10 Essential (primary) hypertension: Secondary | ICD-10-CM | POA: Diagnosis not present

## 2018-05-22 DIAGNOSIS — J410 Simple chronic bronchitis: Secondary | ICD-10-CM

## 2018-05-22 DIAGNOSIS — R2232 Localized swelling, mass and lump, left upper limb: Secondary | ICD-10-CM

## 2018-05-22 DIAGNOSIS — M810 Age-related osteoporosis without current pathological fracture: Secondary | ICD-10-CM

## 2018-05-22 NOTE — Progress Notes (Signed)
Location:    [facility] Room Number: 145/D Place of Service:  SNF (31) Provider: Edmon CrapeArlo Lassen PA-C  Kerri PerchesSimpson, Margaret E, MD  Patient Care Team: Kerri PerchesSimpson, Margaret E, MD as PCP - General [place]aliFields, Sandi L, MD (Gastroenterology)  Extended Emergency Contact Information Primary Emergency Contact: [facility],Alfreda Address: [address]          [name], [place] [address] [place] Home Phone: [phone] Mobile Phone: [phone] Relation: Daughter Secondary Emergency Contact: Vivien PrestoMiller,Alfred  [place] Mobile Phone: [phone] Relation: Son  Code Status:  Full Code Goals of care: Advanced Directive information Advanced Directives 05/22/2018  Does Patient Have a Medical Advance Directive? Yes  Type of Advance Directive (No Data)  Does patient want to make changes to medical advance directive? No - Patient declined  Copy of Healthcare Power of Attorney in Chart? No - copy requested  Would patient like information on creating a medical advance directive? No - Patient declined  Pre-existing out of facility DNR order (yellow form or pink MOST form) -     Chief Complaint  Patient presents with  . Medical Management of Chronic Issues    Patient is being seen for routine visit of medical management  Medical management of chronic medical conditions including - hypertension-seizure disorder- anemia-venous stasis-osteoarthritis- history of chronic bronchitis in addition to osteoporosis-depression hyperlipidemia  HPI:  [name] seen today for medical management of chronic diseases.   Does not report any acute issues and she has no complaints today.  In June she was seen by Dr. Chales AbrahamsGupta for a small cyst-question lump in her left axilla area recommendation was to check in several weeks to see if it persists--  Per discussion with nursing today and assessment does appear to persist but it has not increased in size it is  nontender and continues to be movable  She also has a history of chronic bronchitis she is on Symbicort and Spiriva and DuoNebs as needed and this appears to be stable.  Regards to hypertension she is on hydrochlorothiazide with potassium supplementation I got a manual blood pressure of 128/78 today- see previous readings 122/77 this appears to be stable.  She also has a history of seizure disorder which is been stable for a considerable amount of time- she is on Keppra for prophylaxis.  Regards to anemia hemoglobin has been stable 5.4 in April will update this.  She also has complaints of osteoarthritic pain at times continues on Tylenol arthritis apparently with relief she is not really complaining of leg pain shoulder pain today.  She does have a history of previous hip fracture continues on Fosamax-she has been on this for approximately 4 years recommendation is to continue for 1 more year-her T score on DEXA scan was -1.665.  She also has a history of depression but this is stable on Prozac and she is followed by her.  She is also on lovastatin with a history of hyperlipidemia and the LDL was 76 lab done this past May.  Currently she has no complaints again she is sitting comfortably in her wheelchair--   Past Medical History:  Diagnosis Date  . Diverticulosis OCT 2010 TCS   SIGMOID COLON  . Esophageal web 2010   DILATION 16 MM  . Gastritis, Helicobacter pylori 2010   Rx. ABO for 10 days   . GERD (gastroesophageal reflux disease)   . Hip fracture, left (HCC)    2016  . Hyperlipidemia   .  Hypertension   . Restless legs syndrome   . Seizures [facility]) January 2010   Post head trauma in fall .sees Dr Gerilyn Pilgrim   Past Surgical History:  Procedure Laterality Date  . ABDOMINAL HYSTERECTOMY    . COLONOSCOPY  OCT 2010   TORTUOUS, Sml IH, Tics, MULTIPLE SIMPLE ADENOMAS (<6MM)  . HIP ARTHROPLASTY Left 11/19/2014   Procedure: LEFT PARTIAL HIP REPLACEMENT;  Surgeon: Vickki Hearing,  MD;  Location: [facility];  Service: Orthopedics;  Laterality: Left;  . ORIF HIP FRACTURE  04/05/2012   Procedure: OPEN REDUCTION INTERNAL FIXATION HIP;  Surgeon: Vickki Hearing, MD;  Location: [facility];  Service: Orthopedics;  Laterality: Right;  . UPPER GASTROINTESTINAL ENDOSCOPY  AUG 2010   SAVARY  . VESICOVAGINAL FISTULA CLOSURE W/ TAH  1966   bleeding     No Known Allergies  Outpatient Encounter Medications as of 05/22/2018  Medication Sig  . acetaminophen (TYLENOL) 650 MG CR tablet Take 1,300 mg by mouth daily.  Marland Kitchen alendronate (FOSAMAX) 70 MG tablet Take with full glass of water once a day on Monday  . ammonium lactate (LAC-HYDRIN) 12 % lotion Apply to right knee once a day  . aspirin 81 MG chewable tablet Chew 81 mg by mouth daily.  . calcium-vitamin D (OSCAL WITH D) 500-200 MG-UNIT TABS TAKE (1) TABLET BY MOUTH (3) TIMES DAILY WITH MEALS.  Marland Kitchen docusate sodium (COLACE) 100 MG capsule Take 200 mg by mouth every evening.   Marland Kitchen FLUoxetine (PROZAC) 10 MG tablet Take 1 tablet 10 mg by mouth along with 20 mg to equal 30 mg once a day  . FLUoxetine (PROZAC) 20 MG tablet Take 1 tablet 20 mg along with 10 mg to equal 30 mg by mouth once a day  . hydrochlorothiazide (HYDRODIURIL) 12.5 MG tablet Take 12.5 mg by mouth daily.  Marland Kitchen ipratropium-albuterol (DUONEB) 0.5-2.5 (3) MG/3ML SOLN Take 3 mLs by nebulization every 6 (six) hours as needed.  . levETIRAcetam (KEPPRA) 500 MG tablet TAKE 1 TABLET BY MOUTH TWICE A DAY AS DIRECTED.  Marland Kitchen lovastatin (MEVACOR) 20 MG tablet Take 1 tablet (20 mg total) by mouth at bedtime.  . Melatonin 5 MG TABS Take 5 mg by mouth at bedtime.  . Multiple Vitamin (MULTIVITAMIN) tablet Take 1 tablet by mouth daily.  . polyethylene glycol (MIRALAX / GLYCOLAX) packet Take 17 g by mouth daily.  . potassium chloride (K-DUR,KLOR-CON) 10 MEQ tablet Take 10 mEq by mouth daily.  Marland Kitchen senna (SENOKOT) 8.6 MG TABS tablet Take 2 tablets by mouth at bedtime.   . SYMBICORT 80-4.5 MCG/ACT inhaler  Inhale 2 puffs into the lungs 2 (two) times daily.   Marland Kitchen tiotropium (SPIRIVA) 18 MCG inhalation capsule Give 2 puffs once a day   No facility-administered encounter medications on file as of 05/22/2018.      Review of Systems   In general she does not have any complaints does not complain of fever chills her weight appears to be relatively stable.  Skin is not complain of rashes or itching-.  Head ears eyes nose mouth and throat does not complain of visual changes she has prescription lenses is not complaining of a sore throat or difficulty swallowing.  Respiratory denies any shortness of breath or cough.  Cardiac does not complain of chest pain has some mild baseline lower extremity edema venous stasis changes.  GI is not complaining of abdominal pain nausea vomiting diarrhea or constipation.  GU does not complain of dysuria.  Musculoskeletal at times will  complain of some joint pain and shoulder discomfort but is not complaining of that this afternoon.  Neurologic does not complain of dizziness headache numbness P.  Psych does not complain of being overtly anxious or depressed-    Immunization History  Administered Date(s) Administered  . Influenza Split 07/25/2012  . Influenza,inj,Quad PF,6+ Mos 07/30/2013, 08/18/2014  . Influenza-Unspecified 07/06/2016, 07/12/2017  . PPD Test 12/06/2014  . Pneumococcal Conjugate-13 04/29/2014  . Pneumococcal Polysaccharide-23 07/25/2012  . Tdap 08/08/2011   Pertinent  Health Maintenance Due  Topic Date Due  . INFLUENZA VACCINE  06/20/2018 (Originally 05/08/2018)  . DEXA SCAN  Completed  . PNA vac Low Risk Adult  Completed   Fall Risk  07/04/2017 04/29/2014 07/30/2013  Falls in the past year? No Yes Yes  Number falls in past yr: - 1 2 or more  Injury with Fall? - No -  Risk for fall due to : - - Impaired balance/gait;Impaired mobility;History of fall(s)   Functional Status Survey:    Vitals:   05/22/18 1148  BP: 124/60  Pulse: 76   Resp: 16  Temp: 97.8 F (36.6 C)  TempSrc: Oral  SpO2: 95%  Weight: 270 lb (122.5 kg)  Height: 5\' 7"  (1.702 m)  Body mass index is 42.29 kg/m.  Manual blood pressure was 128/78  Physical Exam   In general this is a somewhat obese elderly female in no distress sitting comfortably in her wheelchair.  Her skin is warm and dry.  I do note under her left axilla area there is a small firm somewhat movable nontender hardened area question nodule- I did evaluate  this with the nurse who discovered this several weeks ago- she says it is relatively unchanged from when she first felt it there is no tenderness  Eyes she has prescription lenses sclera and conjunctive are clear visual acuity appears grossly intact.  Oropharynx is clear mucous membranes moist.  Chest is clear to auscultation there is no labored breathing.  Air entry is somewhat shallow.  Heart is regular rate and rhythm without murmur gallop or rub somewhat distant heart sounds she has quite mild lower extremity edema venous stasis changes.  Abdomen is obese soft nontender with positive bowel sounds.  Musculoskeletal moves all extremities at baseline ambulates in wheelchair appears to do fairly well with this she does have arthritic changes diffuse.  Neurologic is grossly intact her speech is clear no lateralizing findings.  Psych she is oriented to self has some cognitive deficits but does well with supportive care she is pleasant and appropriate   Labs reviewed: Recent Labs    01/31/18 0741 02/25/18 0707 03/12/18 0758  NA 140 142 141  K 3.9 3.8 3.9  CL 99* 103 102  CO2 28 30 33*  GLUCOSE 96 103* 98  BUN 17 26* 16  CREATININE 0.73 0.86 0.79  CALCIUM 8.8* 8.9 9.0   Recent Labs    06/19/17 0702 02/25/18 0707  AST 22 26  ALT 18 19  ALKPHOS 55 66  BILITOT 0.5 0.6  PROT 6.8 7.7  ALBUMIN 3.6 4.2   Recent Labs    06/19/17 0702 10/04/17 0515 01/31/18 0741  WBC 5.6 5.6 5.0  NEUTROABS  --  2.5 2.4    HGB 12.6 13.7 12.4  HCT 37.7 43.4 38.7  MCV 92.9 93.5 92.4  PLT 165 177 160   Lab Results  Component Value Date   TSH 1.590 06/19/2017   Lab Results  Component Value Date   HGBA1C 5.5  01/31/2018   Lab Results  Component Value Date   CHOL 140 02/25/2018   HDL 50 02/25/2018   LDLCALC 76 02/25/2018   TRIG 70 02/25/2018   CHOLHDL 2.8 02/25/2018    Significant Diagnostic Results in last 30 days:  No results found.  Assessment/Plan  #1- history of hypertension this appears stable on hydrochlorothiazide with potassium supplementation will update a BMP for updated values-manual blood pressure today 128/78 appears to be relatively baseline.  2.  History of chronic bronchitis this appears stabilized on her Symbicort as well as Spiriva-she does have duo nebs as needed at times she will have wheezing but I did not hear that today on exam.  3.  History of question nodule left axilla area- this  was discussed with Dr. Chales AbrahamsGupta - consider further imaging-ultrasound --will order this of breast and axilla areas  4-chronic venous stasis this appears to be stable edema appears to be at baseline weight appears to be relatively stable currently 270 recent baseline is ranged from 265-up to 275.  5.-  History of osteoarthritic pain-she does receive routine Tylenol arthritis and appears to help --she is not complaining of pain today.  6.-  History of anemia but this appears stable with a hemoglobin of 12.4 in April will update this  7- seizure disorder this is been stable for an extended period of time on Keppra.  8.  History of osteoporosis with history of previous femur fracture she is on Fosamax for another year T score was -1.655.  9.  History of depression she is on Prozac this appears to be relatively stable she is followed by psychiatric services-   #10- history of hyperlipidemia she is on a statin and LDL was 76 on lab done in May.  11.  History of insomnia she continues on melatonin  she is not complain today.  12.-History of constipation continues on senna and Colace   Again will update a CBC and BMP for updated values-also will order an ultrasound of her breasts and axilla secondary to question node-cyst left axilla area   [id number]-of note greater than 40 minutes spent assessing patient- reviewing her chart and labs- reviewing and assessing her status with nursing- coordinating and formulating a plan of care for numerous diagnoses- of note greater than 50% of time spent coordinating a plan of care

## 2018-05-23 ENCOUNTER — Encounter (HOSPITAL_COMMUNITY)
Admission: RE | Admit: 2018-05-23 | Discharge: 2018-05-23 | Disposition: A | Discharge status: Home / Self Care | Payer: Medicare Other | Source: Skilled Nursing Facility | Attending: Internal Medicine | Admitting: Internal Medicine

## 2018-05-23 DIAGNOSIS — F3289 Other specified depressive episodes: Secondary | ICD-10-CM | POA: Diagnosis present

## 2018-05-23 DIAGNOSIS — J42 Unspecified chronic bronchitis: Secondary | ICD-10-CM | POA: Diagnosis present

## 2018-05-23 DIAGNOSIS — F482 Pseudobulbar affect: Secondary | ICD-10-CM | POA: Diagnosis present

## 2018-05-23 DIAGNOSIS — F0390 Unspecified dementia without behavioral disturbance: Secondary | ICD-10-CM | POA: Diagnosis present

## 2018-05-23 DIAGNOSIS — I10 Essential (primary) hypertension: Secondary | ICD-10-CM | POA: Insufficient documentation

## 2018-05-23 DIAGNOSIS — M6281 Muscle weakness (generalized): Secondary | ICD-10-CM | POA: Diagnosis present

## 2018-05-23 DIAGNOSIS — M1990 Unspecified osteoarthritis, unspecified site: Secondary | ICD-10-CM | POA: Insufficient documentation

## 2018-05-23 LAB — BASIC METABOLIC PANEL
ANION GAP: 10 (ref 5–15)
BUN: 15 mg/dL (ref 8–23)
CALCIUM: 8.7 mg/dL — AB (ref 8.9–10.3)
CO2: 31 mmol/L (ref 22–32)
Chloride: 101 mmol/L (ref 98–111)
Creatinine, Ser: 0.78 mg/dL (ref 0.44–1.00)
GFR calc Af Amer: >60 mL/min (ref >60)
GFR calc non Af Amer: >60 mL/min (ref >60)
Glucose, Bld: 97 mg/dL (ref 70–99)
Potassium: 4 mmol/L (ref 3.5–5.1)
Sodium: 142 mmol/L (ref 135–145)

## 2018-05-23 LAB — CBC WITH DIFFERENTIAL/PLATELET
BASOS PCT: 0 %
Basophils Absolute: 0 10*3/uL (ref 0.0–0.1)
Eosinophils Absolute: 0.2 10*3/uL (ref 0.0–0.7)
Eosinophils Relative: 3 %
HEMATOCRIT: 41.3 % (ref 36.0–46.0)
HEMOGLOBIN: 13.3 g/dL (ref 12.0–15.0)
Lymphocytes Relative: 42 %
Lymphs Abs: 2.5 10*3/uL (ref 0.7–4.0)
MCH: 30 pg (ref 26.0–34.0)
MCHC: 32.2 g/dL (ref 30.0–36.0)
MCV: 93 fL (ref 78.0–100.0)
Monocytes Absolute: 0.8 10*3/uL (ref 0.1–1.0)
Monocytes Relative: 14 %
NEUTROS ABS: 2.5 10*3/uL (ref 1.7–7.7)
NEUTROS PCT: 41 %
Platelets: 166 10*3/uL (ref 150–400)
RBC: 4.44 MIL/uL (ref 3.87–5.11)
RDW: 14.3 % (ref 11.5–15.5)
WBC: 5.9 10*3/uL (ref 4.0–10.5)

## 2018-05-29 ENCOUNTER — Encounter: Payer: Self-pay | Admitting: Internal Medicine

## 2018-05-29 ENCOUNTER — Non-Acute Institutional Stay (SKILLED_NURSING_FACILITY): Payer: Medicare Other | Admitting: Internal Medicine

## 2018-05-29 DIAGNOSIS — M7989 Other specified soft tissue disorders: Secondary | ICD-10-CM

## 2018-05-29 DIAGNOSIS — I10 Essential (primary) hypertension: Secondary | ICD-10-CM | POA: Diagnosis not present

## 2018-05-29 NOTE — Progress Notes (Signed)
Location:   [facility] Room Number: S145D Place of Service:  SNF [phone]) Provider:  Odessa Fleming, MD  Patient Care Team: Kerri Perches, MD as PCP - General West Bali, MD (Gastroenterology)  Extended Emergency Contact Information Primary Emergency Contact: [facility] Address: [address]          [name], [place] [address] [place] of [place] Home Phone: [phone] Mobile Phone: [phone] Relation: Daughter Secondary Emergency Contact: Vivien Presto [place] of [place] Mobile Phone: [phone] Relation: Son  Code Status:  Full Code Goals of care: Advanced Directive information Advanced Directives 05/22/2018  Does Patient Have a Medical Advance Directive? Yes  Type of Advance Directive (No Data)  Does patient want to make changes to medical advance directive? No - Patient declined  Copy of Healthcare Power of Attorney in Chart? No - copy requested  Would patient like information on creating a medical advance directive? No - Patient declined  Pre-existing out of facility DNR order (yellow form or pink MOST form) -     Chief Complaint  Patient presents with  . Acute Visit    Follow up Ultrasound  Of left axilla area  HPI:  [name] seen today for an acute visit to Follow up Ultrasound of her left axilla area.  Is a long-term resident of facility with a history of hypertension as well as seizure disorder-anemia-venous stasis-osteoarthritis- as well as a history of chronic bronchitis in addition to osteoporosis depression and hyperlipidemia.  In June she was seen by Dr. Chales Abrahams for a small cyst like lump area in her left axilla area- mentation was to her check on this in several weeks to see if it persists.  I did check on it recently and it still was there but unchanged in size or appearance it is not really tender  Currently an ultrasound was ordered which is come back showing a 0.9 x  0.9 5.06 cm mass in the region of the left armpit-with differential lipoma or other soft tissue mass-.  Today the presentation of the area is unchanged it is quite small is firm movable nontender.       Past Medical History:  Diagnosis Date  . Diverticulosis OCT 2010 TCS   SIGMOID COLON  . Esophageal web 2010   DILATION 16 MM  . Gastritis, Helicobacter pylori 2010   Rx. ABO for 10 days   . GERD (gastroesophageal reflux disease)   . Hip fracture, left (HCC)    2016  . Hyperlipidemia   . Hypertension   . Restless legs syndrome   . Seizures [facility]) January 2010   Post head trauma in fall .sees Dr Gerilyn Pilgrim   Past Surgical History:  Procedure Laterality Date  . ABDOMINAL HYSTERECTOMY    . COLONOSCOPY  OCT 2010   TORTUOUS, Sml IH, Tics, MULTIPLE SIMPLE ADENOMAS (<6MM)  . HIP ARTHROPLASTY Left 11/19/2014   Procedure: LEFT PARTIAL HIP REPLACEMENT;  Surgeon: Vickki Hearing, MD;  Location: [facility];  Service: Orthopedics;  Laterality: Left;  . ORIF HIP FRACTURE  04/05/2012   Procedure: OPEN REDUCTION INTERNAL FIXATION HIP;  Surgeon: Vickki Hearing, MD;  Location: [facility];  Service: Orthopedics;  Laterality: Right;  . UPPER GASTROINTESTINAL ENDOSCOPY  AUG 2010   SAVARY  . VESICOVAGINAL FISTULA CLOSURE W/ TAH  1966   bleeding     No Known Allergies  Outpatient Encounter Medications as of 05/29/2018  Medication Sig  . acetaminophen (  TYLENOL) 650 MG CR tablet Take 650 mg by mouth daily.   Marland Kitchen alendronate (FOSAMAX) 70 MG tablet Take with full glass of water once a day on Monday  . ammonium lactate (LAC-HYDRIN) 12 % lotion Apply to right knee once a day  . aspirin 81 MG chewable tablet Chew 81 mg by mouth daily.  . calcium-vitamin D (OSCAL WITH D) 500-200 MG-UNIT TABS TAKE (1) TABLET BY MOUTH (3) TIMES DAILY WITH MEALS.  Marland Kitchen docusate sodium (COLACE) 100 MG capsule Take 200 mg by mouth every evening.   Marland Kitchen FLUoxetine (PROZAC) 10 MG tablet Take 1 tablet 10 mg by mouth along with 20 mg to  equal 30 mg once a day  . FLUoxetine (PROZAC) 20 MG tablet Take 1 tablet 20 mg along with 10 mg to equal 30 mg by mouth once a day  . hydrochlorothiazide (HYDRODIURIL) 12.5 MG tablet Take 12.5 mg by mouth daily.  Marland Kitchen ipratropium-albuterol (DUONEB) 0.5-2.5 (3) MG/3ML SOLN Take 3 mLs by nebulization every 6 (six) hours as needed.  . levETIRAcetam (KEPPRA) 500 MG tablet TAKE 1 TABLET BY MOUTH TWICE A DAY AS DIRECTED.  Marland Kitchen lovastatin (MEVACOR) 20 MG tablet Take 1 tablet (20 mg total) by mouth at bedtime.  . Melatonin 5 MG TABS Take 5 mg by mouth at bedtime.  . Multiple Vitamin (MULTIVITAMIN) tablet Take 1 tablet by mouth daily.  . polyethylene glycol (MIRALAX / GLYCOLAX) packet Take 17 g by mouth daily.  . potassium chloride (K-DUR,KLOR-CON) 10 MEQ tablet Take 10 mEq by mouth daily.  Marland Kitchen senna (SENOKOT) 8.6 MG TABS tablet Take 2 tablets by mouth at bedtime.   . SYMBICORT 80-4.5 MCG/ACT inhaler Inhale 2 puffs into the lungs 2 (two) times daily.   Marland Kitchen tiotropium (SPIRIVA) 18 MCG inhalation capsule Give 2 puffs once a day   No facility-administered encounter medications on file as of 05/29/2018.     Review of Systems   Fever chills.  Skin does not complain of rashes or itching does have the area under her left armpit as noted above.  Head ears eyes nose mouth and throat is not complaining of visual changes she has prescription lenses does not complain of difficulty swallowing or sore throat.  Respiratory does not complain of shortness of breath or cough does have a history of chronic bronchitis.  Cardiac is not complain of chest pain or increased lower extremity edema from baseline.  GI is not complaining of abdominal pain nausea vomiting diarrhea constipation.  Musculoskeletal is not really complaining of any joint  Today   neurologic does not complain of dizziness   or having a headache  Psych does not complain of being anxious or depressed  Immunization History  Administered Date(s)  Administered  . Influenza Split 07/25/2012  . Influenza,inj,Quad PF,6+ Mos 07/30/2013, 08/18/2014  . Influenza-Unspecified 07/06/2016, 07/12/2017  . PPD Test 12/06/2014  . Pneumococcal Conjugate-13 04/29/2014  . Pneumococcal Polysaccharide-23 07/25/2012  . Tdap 08/08/2011   Pertinent  Health Maintenance Due  Topic Date Due  . INFLUENZA VACCINE  06/20/2018 (Originally 05/08/2018)  . DEXA SCAN  Completed  . PNA vac Low Risk Adult  Completed   Fall Risk  07/04/2017 04/29/2014 07/30/2013  Falls in the past year? No Yes Yes  Number falls in past yr: - 1 2 or more  Injury with Fall? - No -  Risk for fall due to : - - Impaired balance/gait;Impaired mobility;History of fall(s)   Functional Status Survey:      Physical Exam  Wt: 270.0 BP: 157/84, P:64, RR: 20, T: 98.1 Manual blood pressure was 130/88  In general this is somewhat obese elderly female no distress sitting comfortably in her wheelchair.  Her skin is warm and dry-under her left axilla area small movable nontender hardened area feels somewhat nodular- this presentation is similar to what I felt initial exam.  Eyes she continues with prescription lenses sclera and conjunctive are clear.  Chest is clear to auscultation there is no labored breathing.  She has shallow air entry which is baseline.  Heart is regular rate and rhythm without murmur gallop or rub she has baseline.  Abdomen is obese soft nontender with positive bowel sounds.  Musculoskeletal moves all extremities x4 at baseline is ambulatory in wheelchair at baseline.  Neurologic continues to be grossly intact no lateralizing findings her speech is clear.  Psych she is oriented to self is pleasant appropriate does have some mild cognitive deficits but can give a pretty accurate history- was able to tell me about her son who lives in [place] and is a professor.   Labs reviewed: Recent Labs    02/25/18 0707 03/12/18 0758 05/23/18 0728  NA 142 141 142  K  3.8 3.9 4.0  CL 103 102 101  CO2 30 33* 31  GLUCOSE 103* 98 97  BUN 26* 16 15  CREATININE 0.86 0.79 0.78  CALCIUM 8.9 9.0 8.7*   Recent Labs    06/19/17 0702 02/25/18 0707  AST 22 26  ALT 18 19  ALKPHOS 55 66  BILITOT 0.5 0.6  PROT 6.8 7.7  ALBUMIN 3.6 4.2   Recent Labs    10/04/17 0515 01/31/18 0741 05/23/18 0728  WBC 5.6 5.0 5.9  NEUTROABS 2.5 2.4 2.5  HGB 13.7 12.4 13.3  HCT 43.4 38.7 41.3  MCV 93.5 92.4 93.0  PLT 177 160 166   Lab Results  Component Value Date   TSH 1.590 06/19/2017   Lab Results  Component Value Date   HGBA1C 5.5 01/31/2018   Lab Results  Component Value Date   CHOL 140 02/25/2018   HDL 50 02/25/2018   LDLCALC 76 02/25/2018   TRIG 70 02/25/2018   CHOLHDL 2.8 02/25/2018    .  Assessment plan.  1.  Left axilla nodule- at this point ultrasound appears to be fairly benign saying this could be likely a lipoma or other soft tissue mass- clinical presentation is not very dramatic-this was discussed with Dr. Chales AbrahamsGupta and will order repeat ultrasound in a month for follow-up to see if there are any changes.  2.-  Hypertension- it appears she has somewhat variable readings I got a systolic of 130 and  when I saw her for her routine visit I got a systolic in the 120s but listed systolic is in the 150s today- will order blood pressure checks daily with  log for review before making any changes she is on low-dose hydrochlorothiazide  Recent metabolic panel showed stable renal function and electrolytes   [id number]      Alejandra BattenArlo Noa Galvao,PA-C [phone]

## 2018-07-10 ENCOUNTER — Non-Acute Institutional Stay (SKILLED_NURSING_FACILITY): Payer: Medicare Other | Admitting: Internal Medicine

## 2018-07-10 ENCOUNTER — Encounter: Payer: Self-pay | Admitting: Internal Medicine

## 2018-07-10 DIAGNOSIS — G40909 Epilepsy, unspecified, not intractable, without status epilepticus: Secondary | ICD-10-CM

## 2018-07-10 DIAGNOSIS — M810 Age-related osteoporosis without current pathological fracture: Secondary | ICD-10-CM

## 2018-07-10 DIAGNOSIS — I10 Essential (primary) hypertension: Secondary | ICD-10-CM | POA: Diagnosis not present

## 2018-07-10 DIAGNOSIS — F418 Other specified anxiety disorders: Secondary | ICD-10-CM

## 2018-07-10 DIAGNOSIS — F429 Obsessive-compulsive disorder, unspecified: Secondary | ICD-10-CM

## 2018-07-10 DIAGNOSIS — J410 Simple chronic bronchitis: Secondary | ICD-10-CM

## 2018-07-10 DIAGNOSIS — E785 Hyperlipidemia, unspecified: Secondary | ICD-10-CM

## 2018-07-10 NOTE — Progress Notes (Signed)
Location:    [facility] Room Number: 14/D Place of Service:  SNF [phone]) Provider: Einar Crow MD  Kerri Perches, MD  Patient Care Team: Kerri Perches, MD as PCP - General [place] Bali, MD (Gastroenterology)  Extended Emergency Contact Information Primary Emergency Contact: [facility] Address: [address]          [name], [place] [address] [place] of [place] Home Phone: [phone] Mobile Phone: [phone] Relation: Daughter Secondary Emergency Contact: Vivien Presto [place] Mobile Phone: [phone] Relation: Son  Code Status:  Full Code Goals of care: Advanced Directive information Advanced Directives 07/10/2018  Does Patient Have a Medical Advance Directive? Yes  Type of Advance Directive (No Data)  Does patient want to make changes to medical advance directive? No - Patient declined  Copy of Healthcare Power of Attorney in Chart? No - copy requested  Would patient like information on creating a medical advance directive? No - Patient declined  Pre-existing out of facility DNR order (yellow form or pink MOST form) -     Chief Complaint  Patient presents with  . Hospitalization Follow-up    Patient is being seen for routine visit of medical management     HPI:  [name] seen today for medical management of chronic diseases.   Patient has h/o Hypertension, Seizure disorder, Anemia, Chronic Venous stasis, and Arthritis, Chronic Bronchitis, Osteoporosis s/p Femoral neck fracture And depression Patient is a long-term resident of  Facility.  Patient has been doing well in facility. She denies any new issues. No New Nursing issues Her weight is 264 lbs which is stable. She does c/o Insomnia sometimes.  Past Medical History:  Diagnosis Date  . Diverticulosis OCT 2010 TCS   SIGMOID COLON  . Esophageal web 2010   DILATION 16 MM  . Gastritis, Helicobacter pylori 2010   Rx. ABO  for 10 days   . GERD (gastroesophageal reflux disease)   . Hip fracture, left (HCC)    2016  . Hyperlipidemia   . Hypertension   . Restless legs syndrome   . Seizures [facility]) January 2010   Post head trauma in fall .sees Dr Gerilyn Pilgrim   Past Surgical History:  Procedure Laterality Date  . ABDOMINAL HYSTERECTOMY    . COLONOSCOPY  OCT 2010   TORTUOUS, Sml IH, Tics, MULTIPLE SIMPLE ADENOMAS (<6MM)  . HIP ARTHROPLASTY Left 11/19/2014   Procedure: LEFT PARTIAL HIP REPLACEMENT;  Surgeon: Vickki Hearing, MD;  Location: [facility];  Service: Orthopedics;  Laterality: Left;  . ORIF HIP FRACTURE  04/05/2012   Procedure: OPEN REDUCTION INTERNAL FIXATION HIP;  Surgeon: Vickki Hearing, MD;  Location: [facility];  Service: Orthopedics;  Laterality: Right;  . UPPER GASTROINTESTINAL ENDOSCOPY  AUG 2010   SAVARY  . VESICOVAGINAL FISTULA CLOSURE W/ TAH  1966   bleeding     No Known Allergies  Outpatient Encounter Medications as of 07/10/2018  Medication Sig  . acetaminophen (TYLENOL) 650 MG CR tablet Take 650 mg by mouth daily.   Marland Kitchen alendronate (FOSAMAX) 70 MG tablet Take with full glass of water once a day on Monday  . ammonium lactate (LAC-HYDRIN) 12 % lotion Apply to right knee once a day  . aspirin 81 MG chewable tablet Chew 81 mg by mouth daily.  . calcium-vitamin D (OSCAL WITH D) 500-200 MG-UNIT TABS TAKE (1) TABLET BY MOUTH (3) TIMES DAILY WITH MEALS.  Marland Kitchen docusate sodium (COLACE) 100 MG  capsule Take 200 mg by mouth every evening.   Marland Kitchen FLUoxetine (PROZAC) 10 MG tablet Take 1 tablet 10 mg by mouth along with 20 mg to equal 30 mg once a day  . FLUoxetine (PROZAC) 20 MG tablet Take 1 tablet 20 mg along with 10 mg to equal 30 mg by mouth once a day  . hydrochlorothiazide (HYDRODIURIL) 12.5 MG tablet Take 12.5 mg by mouth daily.  Marland Kitchen ipratropium-albuterol (DUONEB) 0.5-2.5 (3) MG/3ML SOLN Take 3 mLs by nebulization every 6 (six) hours as needed.  . levETIRAcetam (KEPPRA) 500 MG tablet TAKE 1 TABLET BY MOUTH  TWICE A DAY AS DIRECTED.  Marland Kitchen lovastatin (MEVACOR) 20 MG tablet Take 1 tablet (20 mg total) by mouth at bedtime.  . Melatonin 5 MG TABS Take 5 mg by mouth at bedtime.  . Multiple Vitamin (MULTIVITAMIN) tablet Take 1 tablet by mouth daily.  . polyethylene glycol (MIRALAX / GLYCOLAX) packet Take 17 g by mouth daily.  . potassium chloride (K-DUR,KLOR-CON) 10 MEQ tablet Take 10 mEq by mouth daily.  Marland Kitchen senna (SENOKOT) 8.6 MG TABS tablet Take 2 tablets by mouth at bedtime.   . SYMBICORT 80-4.5 MCG/ACT inhaler Inhale 2 puffs into the lungs 2 (two) times daily.   Marland Kitchen tiotropium (SPIRIVA) 18 MCG inhalation capsule Give 2 puffs once a day   No facility-administered encounter medications on file as of 07/10/2018.      Review of Systems  Review of Systems  Constitutional: Negative for activity change, appetite change, chills, diaphoresis, fatigue and fever.  HENT: Negative for mouth sores, postnasal drip, rhinorrhea, sinus pain and sore throat.   Respiratory: Negative for apnea, cough, chest tightness, shortness of breath and wheezing.   Cardiovascular: Negative for chest pain, palpitations and leg swelling.  Gastrointestinal: Negative for abdominal distention, abdominal pain, constipation, diarrhea, nausea and vomiting.  Genitourinary: Negative for dysuria and frequency.  Musculoskeletal: Negative for arthralgias, joint swelling and myalgias.  Skin: Negative for rash.  Neurological: Negative for dizziness, syncope, weakness, light-headedness and numbness.  Psychiatric/Behavioral: Negative for behavioral problems, confusion and sleep disturbance.     Immunization History  Administered Date(s) Administered  . Influenza Split 07/25/2012  . Influenza,inj,Quad PF,6+ Mos 07/30/2013, 08/18/2014  . Influenza-Unspecified 07/06/2016, 07/12/2017  . PPD Test 12/06/2014  . Pneumococcal Conjugate-13 04/29/2014  . Pneumococcal Polysaccharide-23 07/25/2012  . Tdap 08/08/2011   Pertinent  Health Maintenance Due   Topic Date Due  . INFLUENZA VACCINE  08/10/2018 (Originally 05/08/2018)  . DEXA SCAN  Completed  . PNA vac Low Risk Adult  Completed   Fall Risk  07/04/2017 04/29/2014 07/30/2013  Falls in the past year? No Yes Yes  Number falls in past yr: - 1 2 or more  Injury with Fall? - No -  Risk for fall due to : - - Impaired balance/gait;Impaired mobility;History of fall(s)   Functional Status Survey:    Vitals:   07/10/18 1211  BP: 139/76  Pulse: 60  Resp: 20  Temp: (!) 97.1 F (36.2 C)  TempSrc: Oral  SpO2: 95%  Weight: 264 lb (119.7 kg)  Height: 5\' 7"  (1.702 m)   Body mass index is 41.35 kg/m. Physical Exam  Constitutional: She appears well-developed and well-nourished.  HENT:  Head: Normocephalic.  Mouth/Throat: Oropharynx is clear and moist.  Eyes: Pupils are equal, round, and reactive to light.  Neck: Normal range of motion.  Cardiovascular: Normal rate and regular rhythm.  No murmur heard. Pulmonary/Chest: Effort normal. No respiratory distress. She has no wheezes. She has  no rales.  Abdominal: Soft. Bowel sounds are normal. She exhibits no distension. There is no tenderness. There is no guarding.  Musculoskeletal:  Trace edema Bilateral  Lymphadenopathy:    She has no cervical adenopathy.  Neurological: She is alert.  She has very Flat effect. She is mostly Wheelchair bound but can do some transfers. She walks in the facility with assist.  Skin: Skin is warm and dry.  No Lump Fel tin Left Axilla  Psychiatric: She has a normal mood and affect. Thought content normal.    Labs reviewed: Recent Labs    02/25/18 0707 03/12/18 0758 05/23/18 0728  NA 142 141 142  K 3.8 3.9 4.0  CL 103 102 101  CO2 30 33* 31  GLUCOSE 103* 98 97  BUN 26* 16 15  CREATININE 0.86 0.79 0.78  CALCIUM 8.9 9.0 8.7*   Recent Labs    02/25/18 0707  AST 26  ALT 19  ALKPHOS 66  BILITOT 0.6  PROT 7.7  ALBUMIN 4.2   Recent Labs    10/04/17 0515 01/31/18 0741 05/23/18 0728  WBC  5.6 5.0 5.9  NEUTROABS 2.5 2.4 2.5  HGB 13.7 12.4 13.3  HCT 43.4 38.7 41.3  MCV 93.5 92.4 93.0  PLT 177 160 166   Lab Results  Component Value Date   TSH 1.590 06/19/2017   Lab Results  Component Value Date   HGBA1C 5.5 01/31/2018   Lab Results  Component Value Date   CHOL 140 02/25/2018   HDL 50 02/25/2018   LDLCALC 76 02/25/2018   TRIG 70 02/25/2018   CHOLHDL 2.8 02/25/2018    Significant Diagnostic Results in last 30 days:  No results found.  Assessment/Plan COPD Stable on Symbicort and Spiriva Essential HTN On HCTZ and stable Repeat BMP  Seizure disorder .Stable on Keppra Depression with anxiety Stable on Prozac. Follows with psych services  Hyperlipidemia, LDL was 76 in 05/19  Continue lovastatin.  Repeat fasting lipid profile  Constipation On senna and Colace  Insomnia  Continue lon melatonin 5 mg No Changes for now. Osteoporosis, senile Patient os on Chronic Fosamax therapy. . It seems since 2014. Serum Calcium Normal. Repeat Dexa scan showed T score of -1.655 Continue Fosamax for another year and then she will get 5 years of therapy Left Axilla Soft tissue swelling Patient had US done in Facility in 08/22 and it showed small Soft tissue swelling in her Left Axilla.? Lipoma I Don't feel it anymore in my exam Will get follow up US in Hospital  Family/ staff Communication:   Labs/tests ordered:

## 2018-07-11 ENCOUNTER — Encounter (HOSPITAL_COMMUNITY)
Admission: RE | Admit: 2018-07-11 | Discharge: 2018-07-11 | Disposition: A | Discharge status: Home / Self Care | Payer: Medicare Other | Source: Skilled Nursing Facility | Attending: Internal Medicine | Admitting: Internal Medicine

## 2018-07-11 DIAGNOSIS — E785 Hyperlipidemia, unspecified: Secondary | ICD-10-CM | POA: Diagnosis not present

## 2018-07-11 LAB — BASIC METABOLIC PANEL
ANION GAP: 10 (ref 5–15)
BUN: 16 mg/dL (ref 8–23)
CHLORIDE: 101 mmol/L (ref 98–111)
CO2: 32 mmol/L (ref 22–32)
Calcium: 9.2 mg/dL (ref 8.9–10.3)
Creatinine, Ser: 0.9 mg/dL (ref 0.44–1.00)
GFR calc non Af Amer: 58 mL/min — ABNORMAL LOW (ref >60)
Glucose, Bld: 104 mg/dL — ABNORMAL HIGH (ref 70–99)
POTASSIUM: 3.7 mmol/L (ref 3.5–5.1)
Sodium: 143 mmol/L (ref 135–145)

## 2018-07-11 LAB — CBC
HCT: 42.9 % (ref 36.0–46.0)
HEMOGLOBIN: 14.1 g/dL (ref 12.0–15.0)
MCH: 30.6 pg (ref 26.0–34.0)
MCHC: 32.9 g/dL (ref 30.0–36.0)
MCV: 93.1 fL (ref 78.0–100.0)
Platelets: 182 10*3/uL (ref 150–400)
RBC: 4.61 MIL/uL (ref 3.87–5.11)
RDW: 14.2 % (ref 11.5–15.5)
WBC: 6.4 10*3/uL (ref 4.0–10.5)

## 2018-07-11 LAB — LIPASE, BLOOD: LIPASE: 29 U/L (ref 11–51)

## 2018-07-12 LAB — VITAMIN D 25 HYDROXY (VIT D DEFICIENCY, FRACTURES): VIT D 25 HYDROXY: 40.9 ng/mL (ref 30.0–100.0)

## 2018-07-14 ENCOUNTER — Non-Acute Institutional Stay (SKILLED_NURSING_FACILITY): Payer: Medicare Other

## 2018-07-14 DIAGNOSIS — Z0000 Encounter for general adult medical examination without abnormal findings: Secondary | ICD-10-CM | POA: Diagnosis not present

## 2018-07-14 NOTE — Patient Instructions (Signed)
Alejandra Cruz , Thank you for taking time to come for your Medicare Wellness Visit. I appreciate your ongoing commitment to your health goals. Please review the following plan we discussed and let me know if I can assist you in the future.   Screening recommendations/referrals: Colonoscopy excluded, over age 82 Mammogram excluded, over age 58 Bone Density up to date Recommended yearly ophthalmology/optometry visit for glaucoma screening and checkup Recommended yearly dental visit for hygiene and checkup  Vaccinations: Influenza vaccine due, will receive at [facility] Pneumococcal vaccine up to date, completed Tdap vaccine up to date, due 08/07/2021 Shingles vaccine not in past records    Advanced directives: in chart  Conditions/risks identified: none  Next appointment: Dr. Chales Abrahams makes rounds   Preventive Care 65 Years and Older, Female Preventive care refers to lifestyle choices and visits with your health care provider that can promote health and wellness. What does preventive care include?  A yearly physical exam. This is also called an annual well check.  Dental exams once or twice a year.  Routine eye exams. Ask your health care provider how often you should have your eyes checked.  Personal lifestyle choices, including:  Daily care of your teeth and gums.  Regular physical activity.  Eating a healthy diet.  Avoiding tobacco and drug use.  Limiting alcohol use.  Practicing safe sex.  Taking low-dose aspirin every day.  Taking vitamin and mineral supplements as recommended by your health care provider. What happens during an annual well check? The services and screenings done by your health care provider during your annual well check will depend on your age, overall health, lifestyle risk factors, and family history of disease. Counseling  Your health care provider may ask you questions about your:  Alcohol use.  Tobacco use.  Drug use.  Emotional  well-being.  Home and relationship well-being.  Sexual activity.  Eating habits.  History of falls.  Memory and ability to understand (cognition).  Work and work Astronomer.  Reproductive health. Screening  You may have the following tests or measurements:  Height, weight, and BMI.  Blood pressure.  Lipid and cholesterol levels. These may be checked every 5 years, or more frequently if you are over 74 years old.  Skin check.  Lung cancer screening. You may have this screening every year starting at age 24 if you have a 30-pack-year history of smoking and currently smoke or have quit within the past 15 years.  Fecal occult blood test (FOBT) of the stool. You may have this test every year starting at age 60.  Flexible sigmoidoscopy or colonoscopy. You may have a sigmoidoscopy every 5 years or a colonoscopy every 10 years starting at age 97.  Hepatitis C blood test.  Hepatitis B blood test.  Sexually transmitted disease (STD) testing.  Diabetes screening. This is done by checking your blood sugar (glucose) after you have not eaten for a while (fasting). You may have this done every 1-3 years.  Bone density scan. This is done to screen for osteoporosis. You may have this done starting at age 23.  Mammogram. This may be done every 1-2 years. Talk to your health care provider about how often you should have regular mammograms. Talk with your health care provider about your test results, treatment options, and if necessary, the need for more tests. Vaccines  Your health care provider may recommend certain vaccines, such as:  Influenza vaccine. This is recommended every year.  Tetanus, diphtheria, and acellular pertussis (Tdap, Td) vaccine.  You may need a Td booster every 10 years.  Zoster vaccine. You may need this after age 82.  Pneumococcal 13-valent conjugate (PCV13) vaccine. One dose is recommended after age 76.  Pneumococcal polysaccharide (PPSV23) vaccine. One  dose is recommended after age 69. Talk to your health care provider about which screenings and vaccines you need and how often you need them. This information is not intended to replace advice given to you by your health care provider. Make sure you discuss any questions you have with your health care provider. Document Released: 10/21/2015 Document Revised: 06/13/2016 Document Reviewed: 07/26/2015 Elsevier Interactive Patient Education  2017 [place] Prevention in the Home Falls can cause injuries. They can happen to people of all ages. There are many things you can do to make your home safe and to help prevent falls. What can I do on the outside of my home?  Regularly fix the edges of walkways and driveways and fix any cracks.  Remove anything that might make you trip as you walk through a door, such as a raised step or threshold.  Trim any bushes or trees on the path to your home.  Use bright outdoor lighting.  Clear any walking paths of anything that might make someone trip, such as rocks or tools.  Regularly check to see if handrails are loose or broken. Make sure that both sides of any steps have handrails.  Any raised decks and porches should have guardrails on the edges.  Have any leaves, snow, or ice cleared regularly.  Use sand or salt on walking paths during winter.  Clean up any spills in your garage right away. This includes oil or grease spills. What can I do in the bathroom?  Use night lights.  Install grab bars by the toilet and in the tub and shower. Do not use towel bars as grab bars.  Use non-skid mats or decals in the tub or shower.  If you need to sit down in the shower, use a plastic, non-slip stool.  Keep the floor dry. Clean up any water that spills on the floor as soon as it happens.  Remove soap buildup in the tub or shower regularly.  Attach bath mats securely with double-sided non-slip rug tape.  Do not have throw rugs and other  things on the floor that can make you trip. What can I do in the bedroom?  Use night lights.  Make sure that you have a light by your bed that is easy to reach.  Do not use any sheets or blankets that are too big for your bed. They should not hang down onto the floor.  Have a firm chair that has side arms. You can use this for support while you get dressed.  Do not have throw rugs and other things on the floor that can make you trip. What can I do in the kitchen?  Clean up any spills right away.  Avoid walking on wet floors.  Keep items that you use a lot in easy-to-reach places.  If you need to reach something above you, use a strong step stool that has a grab bar.  Keep electrical cords out of the way.  Do not use floor polish or wax that makes floors slippery. If you must use wax, use non-skid floor wax.  Do not have throw rugs and other things on the floor that can make you trip. What can I do with my stairs?  Do not leave any  items on the stairs.  Make sure that there are handrails on both sides of the stairs and use them. Fix handrails that are broken or loose. Make sure that handrails are as long as the stairways.  Check any carpeting to make sure that it is firmly attached to the stairs. Fix any carpet that is loose or worn.  Avoid having throw rugs at the top or bottom of the stairs. If you do have throw rugs, attach them to the floor with carpet tape.  Make sure that you have a light switch at the top of the stairs and the bottom of the stairs. If you do not have them, ask someone to add them for you. What else can I do to help prevent falls?  Wear shoes that:  Do not have high heels.  Have rubber bottoms.  Are comfortable and fit you well.  Are closed at the toe. Do not wear sandals.  If you use a stepladder:  Make sure that it is fully opened. Do not climb a closed stepladder.  Make sure that both sides of the stepladder are locked into place.  Ask  someone to hold it for you, if possible.  Clearly mark and make sure that you can see:  Any grab bars or handrails.  First and last steps.  Where the edge of each step is.  Use tools that help you move around (mobility aids) if they are needed. These include:  Canes.  Walkers.  Scooters.  Crutches.  Turn on the lights when you go into a dark area. Replace any light bulbs as soon as they burn out.  Set up your furniture so you have a clear path. Avoid moving your furniture around.  If any of your floors are uneven, fix them.  If there are any pets around you, be aware of where they are.  Review your medicines with your doctor. Some medicines can make you feel dizzy. This can increase your chance of falling. Ask your doctor what other things that you can do to help prevent falls. This information is not intended to replace advice given to you by your health care provider. Make sure you discuss any questions you have with your health care provider. Document Released: 07/21/2009 Document Revised: 03/01/2016 Document Reviewed: 10/29/2014 Elsevier Interactive Patient Education  2017 [employer].

## 2018-07-14 NOTE — Progress Notes (Signed)
Subjective:   Alejandra Cruz is a 84 y.o. female who presents for Medicare Annual (Subsequent) preventive examination at [facility]  Last AWV-07/04/2017    Objective:     Vitals: BP 126/75 (BP Location: Left Arm, Patient Position: Sitting)   Pulse 62   Temp 98.1 F (36.7 C) (Oral)   Ht 5\' 7"  (1.702 m)   Wt 264 lb (119.7 kg)   BMI 41.35 kg/m   Body mass index is 41.35 kg/m.  Advanced Directives 07/14/2018 07/10/2018 05/22/2018 05/20/2018 01/30/2018 11/12/2017 10/14/2017  Does Patient Have a Medical Advance Directive? Yes Yes Yes Yes Yes Yes Yes  Type of Advance Directive (No Data) (No Data) (No Data) (No Data) (No Data) (No Data) (No Data)  Does patient want to make changes to medical advance directive? No - Patient declined No - Patient declined No - Patient declined No - Patient declined No - Patient declined No - Patient declined No - Patient declined  Copy of Healthcare Power of Attorney in Chart? No - copy requested No - copy requested No - copy requested No - copy requested No - copy requested No - copy requested No - copy requested  Would patient like information on creating a medical advance directive? No - Patient declined No - Patient declined No - Patient declined No - Patient declined No - Patient declined No - Patient declined No - Patient declined  Pre-existing out of facility DNR order (yellow form or pink MOST form) - - - - - - -    Tobacco Social History   Tobacco Use  Smoking Status Never Smoker  Smokeless Tobacco Never Used     Counseling given: Not Answered   Clinical Intake:  Pre-visit preparation completed: No  Pain : No/denies pain     Diabetes: No  How often do you need to have someone help you when you read instructions, pamphlets, or other written materials from your doctor or pharmacy?: 2 - Rarely What is the last grade level you completed in school?: High school  Interpreter Needed?: No  Information entered by :: Tyron Russell, RN  Past Medical History:  Diagnosis Date  . Diverticulosis OCT 2010 TCS   SIGMOID COLON  . Esophageal web 2010   DILATION 16 MM  . Gastritis, Helicobacter pylori 2010   Rx. ABO for 10 days   . GERD (gastroesophageal reflux disease)   . Hip fracture, left (HCC)    2016  . Hyperlipidemia   . Hypertension   . Restless legs syndrome   . Seizures [facility]) January 2010   Post head trauma in fall .sees Dr Gerilyn Pilgrim   Past Surgical History:  Procedure Laterality Date  . ABDOMINAL HYSTERECTOMY    . COLONOSCOPY  OCT 2010   TORTUOUS, Sml IH, Tics, MULTIPLE SIMPLE ADENOMAS (<6MM)  . HIP ARTHROPLASTY Left 11/19/2014   Procedure: LEFT PARTIAL HIP REPLACEMENT;  Surgeon: Vickki Hearing, MD;  Location: [facility];  Service: Orthopedics;  Laterality: Left;  . ORIF HIP FRACTURE  04/05/2012   Procedure: OPEN REDUCTION INTERNAL FIXATION HIP;  Surgeon: Vickki Hearing, MD;  Location: [facility];  Service: Orthopedics;  Laterality: Right;  . UPPER GASTROINTESTINAL ENDOSCOPY  AUG 2010   SAVARY  . VESICOVAGINAL FISTULA CLOSURE W/ TAH  1966   bleeding    Family History  Problem Relation Age of Onset  . Cancer Mother        bladder   . Diabetes Mother   .  Diabetes Father   . Emphysema Father   . GER disease Brother   . Colon cancer Neg Hx   . Colon polyps Neg Hx    Social History   Socioeconomic History  . Marital status: Widowed    Spouse name: Not on file  . Number of children: Not on file  . Years of education: Not on file  . Highest education level: Not on file  Occupational History  . 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[employer]   Social Needs  . Financial resource strain: Not hard at all  . Food insecurity:    Worry: Never true    Inability: Never true  . Transportation needs:    Medical: No    Non-medical: No  Tobacco Use  . Smoking status: Never Smoker  . Smokeless tobacco: Never Used  Substance and Sexual Activity  . Alcohol use: No  . Drug use: No  .  Sexual activity: Never  Lifestyle  . Physical activity:    Days per week: 4 days    Minutes per session: 20 min  . Stress: To some extent  Relationships  . Social connections:    Talks on phone: Twice a week    Gets together: Once a week    Attends religious service: Never    Active member of club or organization: No    Attends meetings of clubs or organizations: Never    Relationship status: Widowed  Other Topics Concern  . Not on file  Social History Narrative  . Not on file    Outpatient Encounter Medications as of 07/14/2018  Medication Sig  . acetaminophen (TYLENOL) 650 MG CR tablet Take 650 mg by mouth daily.   Marland Kitchen alendronate (FOSAMAX) 70 MG tablet Take with full glass of water once a day on Monday  . ammonium lactate (LAC-HYDRIN) 12 % lotion Apply to right knee once a day  . aspirin 81 MG chewable tablet Chew 81 mg by mouth daily.  . calcium-vitamin D (OSCAL WITH D) 500-200 MG-UNIT TABS TAKE (1) TABLET BY MOUTH (3) TIMES DAILY WITH MEALS.  Marland Kitchen docusate sodium (COLACE) 100 MG capsule Take 200 mg by mouth every evening.   Marland Kitchen FLUoxetine (PROZAC) 10 MG tablet Take 1 tablet 10 mg by mouth along with 20 mg to equal 30 mg once a day  . FLUoxetine (PROZAC) 20 MG tablet Take 1 tablet 20 mg along with 10 mg to equal 30 mg by mouth once a day  . hydrochlorothiazide (HYDRODIURIL) 12.5 MG tablet Take 12.5 mg by mouth daily.  Marland Kitchen ipratropium-albuterol (DUONEB) 0.5-2.5 (3) MG/3ML SOLN Take 3 mLs by nebulization every 6 (six) hours as needed.  . levETIRAcetam (KEPPRA) 500 MG tablet TAKE 1 TABLET BY MOUTH TWICE A DAY AS DIRECTED.  Marland Kitchen lovastatin (MEVACOR) 20 MG tablet Take 1 tablet (20 mg total) by mouth at bedtime.  . Melatonin 5 MG TABS Take 5 mg by mouth at bedtime.  . Multiple Vitamin (MULTIVITAMIN) tablet Take 1 tablet by mouth daily.  . polyethylene glycol (MIRALAX / GLYCOLAX) packet Take 17 g by mouth daily.  . potassium chloride (K-DUR,KLOR-CON) 10 MEQ tablet Take 10 mEq by mouth daily.  Marland Kitchen  senna (SENOKOT) 8.6 MG TABS tablet Take 2 tablets by mouth at bedtime.   . SYMBICORT 80-4.5 MCG/ACT inhaler Inhale 2 puffs into the lungs 2 (two) times daily.   Marland Kitchen tiotropium (SPIRIVA) 18 MCG inhalation capsule Give 2 puffs once a day   No facility-administered encounter medications on file as  of 07/14/2018.     Activities of Daily Living In your present state of health, do you have any difficulty performing the following activities: 07/14/2018  Hearing? Y  Vision? Y  Difficulty concentrating or making decisions? N  Walking or climbing stairs? Y  Dressing or bathing? Y  Doing errands, shopping? Y  Preparing Food and eating ? Y  Using the Toilet? Y  In the past six months, have you accidently leaked urine? Y  Do you have problems with loss of bowel control? Y  Managing your Medications? Y  Managing your Finances? Y  Housekeeping or managing your Housekeeping? Y  Some recent data might be hidden    Patient Care Team: Kerri Perches, MD as PCP - General [place] Bali, MD (Gastroenterology)    Assessment:   This is a routine wellness examination for Alejandra Cruz.  Exercise Activities and Dietary recommendations Current Exercise Habits: Home exercise routine, Type of exercise: walking(with assist), Time (Minutes): 15, Frequency (Times/Week): 4, Weekly Exercise (Minutes/Week): 60, Intensity: Mild, Exercise limited by: orthopedic condition(s)  Goals   None     Fall Risk Fall Risk  07/14/2018 07/04/2017 04/29/2014 07/30/2013  Falls in the past year? No No Yes Yes  Number falls in past yr: - - 1 2 or more  Injury with Fall? - - No -  Risk for fall due to : - - - Impaired balance/gait;Impaired mobility;History of fall(s)   Is the patient's home free of loose throw rugs in walkways, pet beds, electrical cords, etc?   yes      Grab bars in the bathroom? yes      Handrails on the stairs?   yes      Adequate lighting?   yes  Depression Screen PHQ 2/9 Scores 07/14/2018 07/04/2017  04/29/2014  PHQ - 2 Score 1 4 2   PHQ- 9 Score - 11 12     Cognitive Function MMSE - Mini Mental State Exam 04/29/2014  Orientation to time 5  Orientation to Place 5  Registration 3  Attention/ Calculation 5  Recall 2  Language- name 2 objects 2  Language- repeat 1  Language- follow 3 step command 3  Language- read & follow direction 1  Write a sentence 1  Copy design 0  Total score 28     6CIT Screen 07/14/2018 07/04/2017  What Year? 0 points 0 points  What month? 0 points 0 points  What time? 0 points 0 points  Count back from 20 0 points 0 points  Months in reverse 0 points 0 points  Repeat phrase 4 points 4 points  Total Score 4 4    Immunization History  Administered Date(s) Administered  . Influenza Split 07/25/2012  . Influenza,inj,Quad PF,6+ Mos 07/30/2013, 08/18/2014  . Influenza-Unspecified 07/06/2016, 07/12/2017  . PPD Test 12/06/2014  . Pneumococcal Conjugate-13 04/29/2014  . Pneumococcal Polysaccharide-23 07/25/2012  . Tdap 08/08/2011    Qualifies for Shingles Vaccine? Not in past records  Screening Tests Health Maintenance  Topic Date Due  . INFLUENZA VACCINE  08/10/2018 (Originally 05/08/2018)  . TETANUS/TDAP  08/07/2021  . DEXA SCAN  Completed  . PNA vac Low Risk Adult  Completed    Cancer Screenings: Lung: Low Dose CT Chest recommended if Age 59-80 years, 30 pack-year currently smoking OR have quit w/in 15years. Patient does not qualify. Breast:  Up to date on Mammogram? Yes   Up to date of Bone Density/Dexa? Yes Colorectal: up to date  Additional Screenings:  Hepatitis C Screening:  declined Flu vaccine due: will receive at [facility]     Plan:    I have personally reviewed and addressed the Medicare Annual Wellness questionnaire and have noted the following in the patient's chart:  A. Medical and social history B. Use of alcohol, tobacco or illicit drugs  C. Current medications and supplements D. Functional ability and status E.  Nutritional  status F.  Physical activity G. Advance directives H. List of other physicians I.  Hospitalizations, surgeries, and ER visits in previous 12 months J.  Vitals K. Screenings to include hearing, vision, cognitive, depression L. Referrals and appointments - none  In addition, I have reviewed and discussed with patient certain preventive protocols, quality metrics, and best practice recommendations. A written personalized care plan for preventive services as well as general preventive health recommendations were provided to patient.  See attached scanned questionnaire for additional information.   Signed,   Tyron Russell, RN Nurse Health Advisor  Patient Concerns: None

## 2018-07-22 ENCOUNTER — Inpatient Hospital Stay (HOSPITAL_COMMUNITY): Payer: Medicare Other

## 2018-07-22 ENCOUNTER — Ambulatory Visit (HOSPITAL_COMMUNITY)
Admission: RE | Admit: 2018-07-22 | Discharge: 2018-07-22 | Disposition: A | Discharge status: Home / Self Care | Payer: Medicare Other | Source: Ambulatory Visit | Attending: Internal Medicine | Admitting: Internal Medicine

## 2018-07-22 ENCOUNTER — Ambulatory Visit (HOSPITAL_COMMUNITY): Payer: Medicare Other

## 2018-07-22 DIAGNOSIS — R229 Localized swelling, mass and lump, unspecified: Secondary | ICD-10-CM | POA: Insufficient documentation

## 2018-09-24 ENCOUNTER — Non-Acute Institutional Stay (SKILLED_NURSING_FACILITY): Payer: Medicare Other | Admitting: Internal Medicine

## 2018-09-24 ENCOUNTER — Encounter: Payer: Self-pay | Admitting: Internal Medicine

## 2018-09-24 DIAGNOSIS — G40909 Epilepsy, unspecified, not intractable, without status epilepticus: Secondary | ICD-10-CM | POA: Diagnosis not present

## 2018-09-24 DIAGNOSIS — J410 Simple chronic bronchitis: Secondary | ICD-10-CM

## 2018-09-24 DIAGNOSIS — F418 Other specified anxiety disorders: Secondary | ICD-10-CM

## 2018-09-24 DIAGNOSIS — R600 Localized edema: Secondary | ICD-10-CM | POA: Diagnosis not present

## 2018-09-24 DIAGNOSIS — I10 Essential (primary) hypertension: Secondary | ICD-10-CM | POA: Diagnosis not present

## 2018-09-24 NOTE — Progress Notes (Signed)
Location:    [facility] Room Number: 145/D Place of Service:  SNF (31) Provider: Granville Lewis PA-C  Fayrene Helper, MD  Patient Care Team: Fayrene Helper, MD as PCP - General [name], MD (Gastroenterology)  Extended Emergency Contact Information Primary Emergency Contact: [facility] Address: [name], [place] [address] [name] of [place] Phone: [phone] Mobile Phone: [phone] Relation: Daughter Secondary Emergency Contact: [name] [place] of [place] Mobile Phone: [phone] Relation: Son  Code Status:  Full Code Goals of care: Advanced Directive information Advanced Directives 09/24/2018  Does Patient Have a Medical Advance Directive? Yes  Type of Advance Directive (No Data)  Does patient want to make changes to medical advance directive? No - Patient declined  Copy of [place] in Chart? No - copy requested  Would patient like information on creating a medical advance directive? No - Patient declined  Pre-existing out of facility DNR order (yellow form or pink MOST form) -     Chief Complaint  Patient presents with  . Medical Management of Chronic Issues    Routine visit of medical management  Medical management of chronic medical good conditions including hypertension seizure disorder anemia chronic venous stasis edema osteoarthritis chronic bronchitis osteoporosis status post femoral neck fracture in the past as well as depression .    HPI:  [name] seen today for medical management of chronic diseases.  As noted above  She appears to be doing well- she is lost about 5 pounds since October but this I suspect is desired.  She does have venous stasis edema which appears relatively baseline possibly slightly improved.  For osteoarthritis she continues on Tylenol arthritis routinely in the morning and this appears to help she is  not complaining of pain today.  She also has a history of hypertension she is on low-dose hydrochlorothiazide with potassium supplementation and this appears stable blood pressure appears decent today.  She does have a history of seizure disorder but this is been stable for a considerable amount of time she is on Keppra twice a day.   Where history of bronchitis she is on duo nebs as needed as well as Lonhalal inhaler routinely in addition to Symbicort-she is not complaining of any wheezing cough or congestion today occasionally she will have a bit of wheezing.  She also has a history of depression she is on Prozac and this appears stable.  At times she has some mild confusion but appears to be relatively stable in this regard since she is doing quite well in this environment Past Medical History:  Diagnosis Date  . Diverticulosis OCT 2010 TCS   SIGMOID COLON  . Esophageal web 2010   DILATION 16 MM  . Gastritis, Helicobacter pylori 6606   Rx. ABO for 10 days   . GERD (gastroesophageal reflux disease)   . Hip fracture, left ([place])    2016  . Hyperlipidemia   . Hypertension   . Restless legs syndrome   . Seizures [facility]) January 2010   Post head trauma in fall .sees [name]   Past Surgical History:  Procedure Laterality Date  . ABDOMINAL HYSTERECTOMY    . COLONOSCOPY  OCT 2010   TORTUOUS, Sml IH, Tics, MULTIPLE SIMPLE ADENOMAS (<6MM)  . HIP ARTHROPLASTY Left 11/19/2014   Procedure: LEFT PARTIAL HIP REPLACEMENT;  Surgeon: [name], MD;  [facility];  Service: Orthopedics;  Laterality: Left;  . ORIF HIP FRACTURE  04/05/2012   Procedure: OPEN REDUCTION INTERNAL FIXATION HIP;  Surgeon: [name], MD;  Location: [facility];  Service: Orthopedics;  Laterality: Right;  . UPPER GASTROINTESTINAL ENDOSCOPY  AUG 2010   SAVARY  . VESICOVAGINAL FISTULA CLOSURE W/ TAH  1966   bleeding     No Known Allergies  Outpatient Encounter Medications as of 09/24/2018    Medication Sig  . acetaminophen (TYLENOL) 650 MG CR tablet Take 650 mg by mouth daily.   Marland Kitchen alendronate (FOSAMAX) 70 MG tablet Take with full glass of water once a day on Monday  . ammonium lactate (LAC-HYDRIN) 12 % lotion Apply to right knee once a day  . aspirin 81 MG chewable tablet Chew 81 mg by mouth daily.  . calcium-vitamin D (OSCAL WITH D) 500-200 MG-UNIT TABS TAKE (1) TABLET BY MOUTH (3) TIMES DAILY WITH MEALS.  Marland Kitchen docusate sodium (COLACE) 100 MG capsule Take 200 mg by mouth every evening.   Marland Kitchen FLUoxetine (PROZAC) 10 MG tablet Take 1 tablet 10 mg by mouth along with 20 mg to equal 30 mg once a day  . FLUoxetine (PROZAC) 20 MG tablet Take 1 tablet 20 mg along with 10 mg to equal 30 mg by mouth once a day  . Glycopyrrolate (LONHALA MAGNAIR STARTER KIT) 25 MCG/ML SOLN Inhale 25 mcg/mL into the lungs 2 (two) times daily.  . hydrochlorothiazide (HYDRODIURIL) 12.5 MG tablet Take 12.5 mg by mouth daily.  Marland Kitchen ipratropium-albuterol (DUONEB) 0.5-2.5 (3) MG/3ML SOLN Take 3 mLs by nebulization every 6 (six) hours as needed.  . levETIRAcetam (KEPPRA) 500 MG tablet TAKE 1 TABLET BY MOUTH TWICE A DAY AS DIRECTED.  Marland Kitchen lovastatin (MEVACOR) 20 MG tablet Take 1 tablet (20 mg total) by mouth at bedtime.  . Melatonin 5 MG TABS Take 5 mg by mouth at bedtime.  . Multiple Vitamin (MULTIVITAMIN) tablet Take 1 tablet by mouth daily.  . polyethylene glycol (MIRALAX / GLYCOLAX) packet Take 17 g by mouth daily.  . potassium chloride (K-DUR,KLOR-CON) 10 MEQ tablet Take 10 mEq by mouth daily.  Marland Kitchen senna (SENOKOT) 8.6 MG TABS tablet Take 2 tablets by mouth at bedtime.   . SYMBICORT 80-4.5 MCG/ACT inhaler Inhale 2 puffs into the lungs 2 (two) times daily.   . [DISCONTINUED] tiotropium (SPIRIVA) 18 MCG inhalation capsule Give 2 puffs once a day   No facility-administered encounter medications on file as of 09/24/2018.      Review of Systems   In general she not complaining of any fever chills she is lost about 5 pounds  last couple months but I suspect this is desired.  Skin is not complaining of rashes or itching.  Head ears eyes nose mouth and throat no complaints of sore throat or visual changes.  Respiratory is not complain of shortness of breath or cough or wheezing today.  Cardiac is not complaining of chest pain has chronic lower extremity edema venous stasis.  GI is not complaining of abdominal pain nausea vomiting diarrhea constipation.  GU is not complaining of dysuria.  Musculoskeletal does not complain of joint pain at this point at times will complain of leg pain but this does not appear to be an issue today also at times is complained of shoulder pain but not complaining today.  Neurologic does not complain of dizziness headache or syncope.  And psych does not complain of being depressed or anxious she appears to be doing  well in this regards  Immunization History  Administered Date(s) Administered  . Influenza Split 07/25/2012  . Influenza,inj,Quad PF,6+ Mos 07/30/2013, 08/18/2014  . Influenza-Unspecified 07/06/2016, 07/12/2017  . PPD Test 12/06/2014  . Pneumococcal Conjugate-13 04/29/2014  . Pneumococcal Polysaccharide-23 07/25/2012  . Tdap 08/08/2011   Pertinent  Health Maintenance Due  Topic Date Due  . INFLUENZA VACCINE  Completed  . DEXA SCAN  Completed  . PNA vac Low Risk Adult  Completed   Fall Risk  07/14/2018 07/04/2017 04/29/2014 07/30/2013  Falls in the past year? No No Yes Yes  Number falls in past yr: - - 1 2 or more  Injury with Fall? - - No -  Risk for fall due to : - - - Impaired balance/gait;Impaired mobility;History of fall(s)   Functional Status Survey:    Vitals:   09/24/18 1500  BP: 121/70  Pulse: 69  Resp: 16  Temp: 97.8 F (36.6 C)  TempSrc: Oral  SpO2: 95%  Weight: 258 lb 9.6 oz (117.3 kg)  Height: _0  (1.702 m)   Body mass index is 40.5 kg/m. Physical Exam   In general this is a pleasant elderly female in no distress she is  well-nourished and well-developed.  Her skin is warm and dry.  Eyes visual acuity appears to be intact sclera and conjunctive are clear she has prescription lenses.  Chest is clear to auscultation with slightly shallow air entry there is no labored breathing or wheezing appreciated.  Heart is regular rate and rhythm without murmur gallop or rub she has chronic venous stasis edema if anything possibly slightly improved from previous exam.  .  Abdomen is obese soft nontender with positive bowel sounds.  Musculoskeletal moves all extremities x4 at baseline grip strength bilaterally remains very strong she is able to ambulate in wheelchair without difficulty.  Neurologic is grossly intact her speech is clear could not really appreciate lateralizing findings   Psych she is oriented to self she is pleasant appropriate can carry on a short conversation she has some mild cognitive deficits but does well.  She is able to tell me about her son who lives in [place] and what he does for a living and her grandson who was actually studying engineering in [place] reviewed: Recent Labs    03/12/18 0758 05/23/18 0728 07/11/18 0700  NA 141 142 143  K 3.9 4.0 3.7  CL 102 101 101  CO2 33* 31 32  GLUCOSE 98 97 104*  BUN _1 CREATININE 0.79 0.78 0.90  CALCIUM 9.0 8.7* 9.2   Recent Labs    02/25/18 0707  AST 26  ALT 19  ALKPHOS 66  BILITOT 0.6  PROT 7.7  ALBUMIN 4.2   Recent Labs    10/04/17 0515 01/31/18 0741 05/23/18 0728 07/11/18 0700  WBC 5.6 5.0 5.9 6.4  NEUTROABS 2.5 2.4 2.5  --   HGB 13.7 12.4 13.3 14.1  HCT 43.4 38.7 41.3 42.9  MCV 93.5 92.4 93.0 93.1  PLT 177 160 166 182   Lab Results  Component Value Date   TSH 1.590 06/19/2017   Lab Results  Component Value Date   HGBA1C 5.5 01/31/2018   Lab Results  Component Value Date   CHOL 140 02/25/2018   HDL 50 02/25/2018   LDLCALC 76 02/25/2018   TRIG 70 02/25/2018   CHOLHDL 2.8 02/25/2018     Significant Diagnostic Results in last 30 days:  No results found.  Assessment/Plan    #  1 history of chronic bronchitis this appears stabilized on current medications including Symbicort and Lonhala inhaler-she also has duo nebs as needed at this point will monitor.  2.  Hypertension appears stable on low-dose further chlorothiazide as well as potassium supplementation low-dose will update a metabolic panel to ensure stability of renal function and electrolytes.  3.  Venous stasis edema this appears relatively baseline again she is on low-dose diuretic hydrochlorothiazide with potassium supplementation- she is actually lost about 5 pounds the past couple months which I suspect is encouraged.  .  4.  History of osteoarthritic pain she does receive routine Tylenol in the morning and this appears to help she is not complaining of pain currently.  5.  History of anemia this shows stability with a hemoglobin of 14.1 back in October since were getting the other lab will obtain an updated hemoglobin as well.  6.  History of seizure disorder this is been stable now for a very extended period of time she is on Keppra and this appears to be tolerated well.    7.  Stable osteoporosis with a history of previous femur fracture she continues on Fosamax recommendation is to continue this for several more months.  8.  History depression she is on Prozac and this appears to be effective.  9.  History of hyperlipidemia she is on a statin-LDL in May was satisfactory at 27.  .  10.-  History of insomnia she is on melatonin at bedtime she is not complaining of insomnia today.  11.  History of questionable nodule left axilla area she has had an ultrasound done back in October that showed a benign palpable circumscribed mass associated with the dermis and subcutaneous tissue in the left axilla thought most likely a benign epidermal inclusion cyst.  Negative for lymphadenopathy.  At this time will  update again a CBC metabolic panel for updated values she appears to be doing well with supportive care nursing does not report any recent issues.  [id number]

## 2018-09-25 ENCOUNTER — Encounter (HOSPITAL_COMMUNITY)
Admission: RE | Admit: 2018-09-25 | Discharge: 2018-09-25 | Disposition: A | Discharge status: Home / Self Care | Payer: Medicare Other | Source: Skilled Nursing Facility | Attending: Internal Medicine | Admitting: Internal Medicine

## 2018-09-25 DIAGNOSIS — F0390 Unspecified dementia without behavioral disturbance: Secondary | ICD-10-CM | POA: Diagnosis present

## 2018-09-25 DIAGNOSIS — D649 Anemia, unspecified: Secondary | ICD-10-CM | POA: Diagnosis not present

## 2018-09-25 DIAGNOSIS — F5109 Other insomnia not due to a substance or known physiological condition: Secondary | ICD-10-CM | POA: Diagnosis not present

## 2018-09-25 DIAGNOSIS — M1990 Unspecified osteoarthritis, unspecified site: Secondary | ICD-10-CM | POA: Insufficient documentation

## 2018-09-25 LAB — CBC WITH DIFFERENTIAL/PLATELET
ABS IMMATURE GRANULOCYTES: 0.02 10*3/uL (ref 0.00–0.07)
BASOS PCT: 1 %
Basophils Absolute: 0 10*3/uL (ref 0.0–0.1)
EOS PCT: 3 %
Eosinophils Absolute: 0.2 10*3/uL (ref 0.0–0.5)
HCT: 42 % (ref 36.0–46.0)
HEMOGLOBIN: 13.2 g/dL (ref 12.0–15.0)
Immature Granulocytes: 0 %
Lymphocytes Relative: 40 %
Lymphs Abs: 2.1 10*3/uL (ref 0.7–4.0)
MCH: 29.6 pg (ref 26.0–34.0)
MCHC: 31.4 g/dL (ref 30.0–36.0)
MCV: 94.2 fL (ref 80.0–100.0)
MONO ABS: 0.6 10*3/uL (ref 0.1–1.0)
MONOS PCT: 13 %
Neutro Abs: 2.2 10*3/uL (ref 1.7–7.7)
Neutrophils Relative %: 43 %
PLATELETS: 177 10*3/uL (ref 150–400)
RBC: 4.46 MIL/uL (ref 3.87–5.11)
RDW: 13.9 % (ref 11.5–15.5)
WBC: 5.1 10*3/uL (ref 4.0–10.5)
nRBC: 0 % (ref 0.0–0.2)

## 2018-09-25 LAB — BASIC METABOLIC PANEL
Anion gap: 7 (ref 5–15)
BUN: 21 mg/dL (ref 8–23)
CHLORIDE: 104 mmol/L (ref 98–111)
CO2: 30 mmol/L (ref 22–32)
Calcium: 8.9 mg/dL (ref 8.9–10.3)
Creatinine, Ser: 0.66 mg/dL (ref 0.44–1.00)
GFR calc Af Amer: >60 mL/min (ref >60)
GFR calc non Af Amer: >60 mL/min (ref >60)
GLUCOSE: 97 mg/dL (ref 70–99)
POTASSIUM: 4 mmol/L (ref 3.5–5.1)
Sodium: 141 mmol/L (ref 135–145)

## 2018-11-24 ENCOUNTER — Encounter: Payer: Self-pay | Admitting: Adult Health

## 2018-11-24 ENCOUNTER — Non-Acute Institutional Stay (SKILLED_NURSING_FACILITY): Payer: Medicare Other | Admitting: Adult Health

## 2018-11-24 DIAGNOSIS — F429 Obsessive-compulsive disorder, unspecified: Secondary | ICD-10-CM

## 2018-11-24 DIAGNOSIS — I10 Essential (primary) hypertension: Secondary | ICD-10-CM

## 2018-11-24 DIAGNOSIS — G8929 Other chronic pain: Secondary | ICD-10-CM | POA: Insufficient documentation

## 2018-11-24 DIAGNOSIS — K5909 Other constipation: Secondary | ICD-10-CM

## 2018-11-24 DIAGNOSIS — M810 Age-related osteoporosis without current pathological fracture: Secondary | ICD-10-CM

## 2018-11-24 DIAGNOSIS — J410 Simple chronic bronchitis: Secondary | ICD-10-CM | POA: Diagnosis not present

## 2018-11-24 DIAGNOSIS — I82539 Chronic embolism and thrombosis of unspecified popliteal vein: Secondary | ICD-10-CM | POA: Diagnosis not present

## 2018-11-24 DIAGNOSIS — R52 Pain, unspecified: Secondary | ICD-10-CM

## 2018-11-24 DIAGNOSIS — E876 Hypokalemia: Secondary | ICD-10-CM | POA: Insufficient documentation

## 2018-11-24 DIAGNOSIS — E785 Hyperlipidemia, unspecified: Secondary | ICD-10-CM

## 2018-11-24 NOTE — Progress Notes (Signed)
Location:   [facility] Room Number: 145 D Place of Service:  SNF (31)   CODE STATUS: Full Code  No Known Allergies  Chief Complaint  Patient presents with  . Medical Management of Chronic Issues    Essential hypertension; simple chronic bronchitis; chronic popliteal dvt    HPI:  She is a 85 year old long term resident of this facility being seen for the management of her chronic illnesses: hypertension; bronchitis; dvt. She denies any uncontrolled pain; no changes in her appetite; no anxiety; no insomnia.    Past Medical History:  Diagnosis Date  . Diverticulosis OCT 2010 TCS   SIGMOID COLON  . Esophageal web 2010   DILATION 16 MM  . Gastritis, Helicobacter pylori 7902   Rx. ABO for 10 days   . GERD (gastroesophageal reflux disease)   . Hip fracture, left ([place])    2016  . Hyperlipidemia   . Hypertension   . Restless legs syndrome   . Seizures [facility]) January 2010   Post head trauma in fall .sees [name]    Past Surgical History:  Procedure Laterality Date  . ABDOMINAL HYSTERECTOMY    . COLONOSCOPY  OCT 2010   TORTUOUS, Sml IH, Tics, MULTIPLE SIMPLE ADENOMAS (<6MM)  . HIP ARTHROPLASTY Left 11/19/2014   Procedure: LEFT PARTIAL HIP REPLACEMENT;  Surgeon: [name], MD;  Location: [facility];  Service: Orthopedics;  Laterality: Left;  . ORIF HIP FRACTURE  04/05/2012   Procedure: OPEN REDUCTION INTERNAL FIXATION HIP;  Surgeon: [name], MD;  Location: [facility];  Service: Orthopedics;  Laterality: Right;  . UPPER GASTROINTESTINAL ENDOSCOPY  AUG 2010   SAVARY  . VESICOVAGINAL FISTULA CLOSURE W/ TAH  1966   bleeding     Social History   Socioeconomic History  . Marital status: Widowed    Spouse name: Not on file  . Number of children: Not on file  . Years of education: Not on file  . Highest education level: Not on file  Occupational History  . 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[employer]   Social Needs  .  Financial resource strain: Not hard at all  . Food insecurity:    Worry: Never true    Inability: Never true  . Transportation needs:    Medical: No    Non-medical: No  Tobacco Use  . Smoking status: Never Smoker  . Smokeless tobacco: Never Used  Substance and Sexual Activity  . Alcohol use: No  . Drug use: No  . Sexual activity: Never  Lifestyle  . Physical activity:    Days per week: 4 days    Minutes per session: 20 min  . Stress: To some extent  Relationships  . Social connections:    Talks on phone: Twice a week    Gets together: Once a week    Attends religious service: Never    Active member of club or organization: No    Attends meetings of clubs or organizations: Never    Relationship status: Widowed  . Intimate partner violence:    Fear of current or ex partner: No    Emotionally abused: No    Physically abused: No    Forced sexual activity: No  Other Topics Concern  . Not on file  Social History Narrative  . Not on file   Family History  Problem Relation Age of Onset  . Cancer Mother        bladder   .  Diabetes Mother   . Diabetes Father   . Emphysema Father   . GER disease Brother   . Colon cancer Neg Hx   . Colon polyps Neg Hx       VITAL SIGNS BP 135/77   Pulse (!) 59   Temp (!) 97.4 F (36.3 C)   Resp 20   Ht '5\' 7"'$  (1.702 m)   Wt 251 lb 6.4 oz (114 kg)   SpO2 95%   BMI 39.37 kg/m   Outpatient Encounter Medications as of 11/24/2018  Medication Sig  . acetaminophen (TYLENOL) 650 MG CR tablet Take 1,300 mg by mouth daily.   Marland Kitchen alendronate (FOSAMAX) 70 MG tablet Take with full glass of water once a day on Monday  . ammonium lactate (LAC-HYDRIN) 12 % lotion Apply to right heel once a day  . aspirin 81 MG chewable tablet Chew 81 mg by mouth daily.  . calcium-vitamin D (OSCAL WITH D) 500-200 MG-UNIT TABS TAKE (1) TABLET BY MOUTH (3) TIMES DAILY WITH MEALS.  Marland Kitchen docusate sodium (COLACE) 100 MG capsule Take 200 mg by mouth every evening.   Marland Kitchen  FLUoxetine (PROZAC) 10 MG tablet Take 1 tablet 10 mg by mouth along with 20 mg to equal 30 mg once a day  . FLUoxetine (PROZAC) 20 MG tablet Take 1 tablet 20 mg along with 10 mg to equal 30 mg by mouth once a day  . Glycopyrrolate (LONHALA MAGNAIR STARTER KIT) 25 MCG/ML SOLN Inhale 25 mcg/mL into the lungs 2 (two) times daily.  . hydrochlorothiazide (HYDRODIURIL) 12.5 MG tablet Take 12.5 mg by mouth daily.  Marland Kitchen ipratropium-albuterol (DUONEB) 0.5-2.5 (3) MG/3ML SOLN Take 3 mLs by nebulization every 6 (six) hours as needed.  . levETIRAcetam (KEPPRA) 500 MG tablet TAKE 1 TABLET BY MOUTH TWICE A DAY AS DIRECTED.  Marland Kitchen lovastatin (MEVACOR) 20 MG tablet Take 1 tablet (20 mg total) by mouth at bedtime.  . Melatonin 5 MG TABS Take 5 mg by mouth at bedtime.  . Multiple Vitamin (MULTIVITAMIN) tablet Take 1 tablet by mouth daily.  . NON FORMULARY Diet type:  NAS  . polyethylene glycol (MIRALAX / GLYCOLAX) packet Take 17 g by mouth daily.  . potassium chloride (K-DUR,KLOR-CON) 10 MEQ tablet Take 10 mEq by mouth daily.  Marland Kitchen senna (SENOKOT) 8.6 MG TABS tablet Take 2 tablets by mouth at bedtime.   . SYMBICORT 80-4.5 MCG/ACT inhaler Inhale 2 puffs into the lungs 2 (two) times daily.    No facility-administered encounter medications on file as of 11/24/2018.      SIGNIFICANT DIAGNOSTIC EXAMS   LABS REVIEWED: TODAY:   02-25-18: chol 140; ldl 76; trig 70; hdl 50; 09-25-18" wbc  5.1; hgb 13.2; hct 42.0; mcv 94.2; plt 177; glucose 97; bun 21; creat 0.66; k+ 4.0; na++ 141 ca 8.9  Review of Systems  Constitutional: Negative for malaise/fatigue.  Respiratory: Negative for cough and shortness of breath.   Cardiovascular: Negative for chest pain, palpitations and leg swelling.  Gastrointestinal: Negative for abdominal pain, constipation and heartburn.  Musculoskeletal: Negative for back pain, joint pain and myalgias.  Skin: Negative.   Neurological: Negative for dizziness.  Psychiatric/Behavioral: The patient is not  nervous/anxious.     Physical Exam Constitutional:      General: She is not in acute distress.    Appearance: She is well-developed. She is obese. She is not diaphoretic.  Neck:     Musculoskeletal: Neck supple.     Thyroid: No thyromegaly.  Cardiovascular:  Rate and Rhythm: Normal rate and regular rhythm.     Pulses: Normal pulses.     Heart sounds: Normal heart sounds.  Pulmonary:     Effort: Pulmonary effort is normal. No respiratory distress.     Breath sounds: Normal breath sounds.  Abdominal:     General: Bowel sounds are normal. There is no distension.     Palpations: Abdomen is soft.     Tenderness: There is no abdominal tenderness.  Musculoskeletal:     Right lower leg: Edema present.     Left lower leg: Edema present.     Comments: Trace bilateral lower extremity edema Is able to move all extremities Uses wheelchair  2016: left hip fracture left hip replacement   Lymphadenopathy:     Cervical: No cervical adenopathy.  Skin:    General: Skin is warm and dry.  Neurological:     Mental Status: She is alert. Mental status is at baseline.  Psychiatric:        Mood and Affect: Mood normal.       ASSESSMENT/ PLAN:  TODAY:   1. Essential hypertension: b/p 135/77: is stable will continue hctz 12.5 mg daily   2.  Simple chronic bronchitis: is stable symbicort 80/4,5 mcg 2 puffs twice daily and glycopyrrolate 25 mcg neb twice daily has duoneb every 6 hours as needed  3. Seizure disorder: is stable no reports of seizure activity; will continue keppra 500 mg twice daily   4. Chronic deep vein thrombosis (DVT) popliteal vein unspecified laterality: is stable will continue asa 81 mg daily   5. Post-menopausal osteoporosis: is stable will continue fosamax 70 mg weekly and calcium supplements  6. Chronic constipation: is stable will continue colace 200 mg daily; senna 2 tabs; miralax daily   7. Hypokalemia: is stable k+ 4.0; will continue k+ 10 meq daily   8.  Unspecified hyperlipidemia: is stable LDL 76; will continue mevacor 20 mg daily  9. Unspecified obsessive compulsive disorder: is stable will continue prozac 30 mg daily and take melatonin 5 mg nightly   10. Chronic generalized pain: stable will continue tylenol 1300 mg daily        MD is aware of resident's narcotic use and is in agreement with current plan of care. We will attempt to wean resident as apropriate   Ok Edwards NP [facility]  Contact [phone] Monday through Friday 8am- 5pm  After hours call [phone]

## 2018-12-18 ENCOUNTER — Encounter: Payer: Self-pay | Admitting: Internal Medicine

## 2018-12-18 NOTE — Progress Notes (Deleted)
Location:  [place] Room Number: 145 D Place of Service:  SNF [phone]) Provider:  Veleta Miners, MD  Fayrene Helper, MD  Patient Care Team: Fayrene Helper, MD as PCP - General [name], MD (Gastroenterology)  Extended Emergency Contact Information Primary Emergency Contact: [facility] Address: [name], [place] [address] [name] of [place] Phone: [phone] Mobile Phone: [phone] Relation: Daughter Secondary Emergency Contact: [name] [place] of [place] Mobile Phone: [phone] Relation: Son  Code Status:  Full Code Goals of care: Advanced Directive information Advanced Directives 11/24/2018  Does Patient Have a Medical Advance Directive? Yes  Type of Advance Directive -  Does patient want to make changes to medical advance directive? No - Patient declined  Copy of [place] in Chart? -  Would patient like information on creating a medical advance directive? No - Patient declined  Pre-existing out of facility DNR order (yellow form or pink MOST form) -     Chief Complaint  Patient presents with  . Medical Management of Chronic Issues    Hypertenion, Hyperlipidemia    HPI:  Pt is a 85 y.o. female seen today for medical management of chronic diseases.     Past Medical History:  Diagnosis Date  . Diverticulosis OCT 2010 TCS   SIGMOID COLON  . Esophageal web 2010   DILATION 16 MM  . Gastritis, Helicobacter pylori 6384   Rx. ABO for 10 days   . GERD (gastroesophageal reflux disease)   . Hip fracture, left ([place])    2016  . Hyperlipidemia   . Hypertension   . Restless legs syndrome   . Seizures [facility]) January 2010   Post head trauma in fall .sees [name]   Past Surgical History:  Procedure Laterality Date  . ABDOMINAL HYSTERECTOMY    . COLONOSCOPY  OCT 2010   TORTUOUS, Sml IH, Tics, MULTIPLE SIMPLE ADENOMAS (<6MM)  . HIP ARTHROPLASTY  Left 11/19/2014   Procedure: LEFT PARTIAL HIP REPLACEMENT;  Surgeon: [name], MD;  Location: [facility];  Service: Orthopedics;  Laterality: Left;  . ORIF HIP FRACTURE  04/05/2012   Procedure: OPEN REDUCTION INTERNAL FIXATION HIP;  Surgeon: [name], MD;  Location: [facility];  Service: Orthopedics;  Laterality: Right;  . UPPER GASTROINTESTINAL ENDOSCOPY  AUG 2010   SAVARY  . VESICOVAGINAL FISTULA CLOSURE W/ TAH  1966   bleeding     No Known Allergies  Outpatient Encounter Medications as of 12/18/2018  Medication Sig  . acetaminophen (TYLENOL) 650 MG CR tablet Take 1,300 mg by mouth daily.   Marland Kitchen alendronate (FOSAMAX) 70 MG tablet Take with full glass of water once a day on Sunday  . ammonium lactate (LAC-HYDRIN) 12 % lotion Apply to right heel once a day  . aspirin 81 MG chewable tablet Chew 81 mg by mouth daily.  . calcium-vitamin D (OSCAL WITH D) 500-200 MG-UNIT TABS TAKE (1) TABLET BY MOUTH (3) TIMES DAILY WITH MEALS.  Marland Kitchen docusate sodium (COLACE) 100 MG capsule Take 200 mg by mouth every evening.   Marland Kitchen FLUoxetine (PROZAC) 10 MG tablet Take 1 tablet 10 mg by mouth along with 20 mg to equal 30 mg once a day  . FLUoxetine (PROZAC) 20 MG tablet Take 1 tablet 20 mg along with 10 mg to equal 30 mg by mouth once a day  . Glycopyrrolate (LONHALA MAGNAIR STARTER KIT) 25 MCG/ML  SOLN Inhale 25 mcg/mL into the lungs 2 (two) times daily.  . hydrochlorothiazide (HYDRODIURIL) 12.5 MG tablet Take 12.5 mg by mouth daily.  Marland Kitchen ipratropium-albuterol (DUONEB) 0.5-2.5 (3) MG/3ML SOLN Take 3 mLs by nebulization every 6 (six) hours as needed.  . levETIRAcetam (KEPPRA) 500 MG tablet TAKE 1 TABLET BY MOUTH TWICE A DAY AS DIRECTED.  Marland Kitchen lovastatin (MEVACOR) 20 MG tablet Take 1 tablet (20 mg total) by mouth at bedtime.  . Melatonin 5 MG TABS Take 5 mg by mouth at bedtime.  . Multiple Vitamin (MULTIVITAMIN) tablet Take 1 tablet by mouth daily.  . NON FORMULARY Diet type:  NAS  . polyethylene glycol (MIRALAX /  GLYCOLAX) packet Take 17 g by mouth daily as needed.   . potassium chloride (K-DUR,KLOR-CON) 10 MEQ tablet Take 10 mEq by mouth daily.  Marland Kitchen senna (SENOKOT) 8.6 MG TABS tablet Take 2 tablets by mouth at bedtime.   . SYMBICORT 80-4.5 MCG/ACT inhaler Inhale 2 puffs into the lungs 2 (two) times daily.    No facility-administered encounter medications on file as of 12/18/2018.     Review of Systems  Immunization History  Administered Date(s) Administered  . Influenza Split 07/25/2012  . Influenza,inj,Quad PF,6+ Mos 07/30/2013, 08/18/2014  . Influenza-Unspecified 07/06/2016, 07/12/2017, 07/11/2018  . PPD Test 12/06/2014  . Pneumococcal Conjugate-13 04/29/2014  . Pneumococcal Polysaccharide-23 07/25/2012  . Pneumococcal-Unspecified 08/01/2016  . Tdap 08/08/2011, 06/20/2017   Pertinent  Health Maintenance Due  Topic Date Due  . INFLUENZA VACCINE  Completed  . DEXA SCAN  Completed  . PNA vac Low Risk Adult  Completed   Fall Risk  07/14/2018 07/04/2017 04/29/2014 07/30/2013  Falls in the past year? No No Yes Yes  Number falls in past yr: - - 1 2 or more  Injury with Fall? - - No -  Risk for fall due to : - - - Impaired balance/gait;Impaired mobility;History of fall(s)   Functional Status Survey:    Vitals:   12/18/18 1138  Weight: 248 lb 9.6 oz (112.8 kg)  Height: '5\' 7"'$  (1.702 m)   Body mass index is 38.94 kg/m. Physical Exam  Labs reviewed: Recent Labs    05/23/18 0728 07/11/18 0700 09/25/18 0750  NA 142 143 141  K 4.0 3.7 4.0  CL 101 101 104  CO2 31 32 30  GLUCOSE 97 104* 97  BUN '15 16 21  '$ CREATININE 0.78 0.90 0.66  CALCIUM 8.7* 9.2 8.9   Recent Labs    02/25/18 0707  AST 26  ALT 19  ALKPHOS 66  BILITOT 0.6  PROT 7.7  ALBUMIN 4.2   Recent Labs    01/31/18 0741 05/23/18 0728 07/11/18 0700 09/25/18 0750  WBC 5.0 5.9 6.4 5.1  NEUTROABS 2.4 2.5  --  2.2  HGB 12.4 13.3 14.1 13.2  HCT 38.7 41.3 42.9 42.0  MCV 92.4 93.0 93.1 94.2  PLT 160 166 182 177    Lab Results  Component Value Date   TSH 1.590 06/19/2017   Lab Results  Component Value Date   HGBA1C 5.5 01/31/2018   Lab Results  Component Value Date   CHOL 140 02/25/2018   HDL 50 02/25/2018   LDLCALC 76 02/25/2018   TRIG 70 02/25/2018   CHOLHDL 2.8 02/25/2018    Significant Diagnostic Results in last 30 days:  No results found.  Assessment/Plan There are no diagnoses linked to this encounter.   Family/ staff Communication: ***  Labs/tests ordered:  ***

## 2018-12-22 NOTE — Progress Notes (Signed)
Entered in error

## 2018-12-30 ENCOUNTER — Encounter: Payer: Self-pay | Admitting: Internal Medicine

## 2018-12-30 NOTE — Progress Notes (Signed)
Entered in error

## 2018-12-31 ENCOUNTER — Non-Acute Institutional Stay (SKILLED_NURSING_FACILITY): Payer: Medicare Other | Admitting: Internal Medicine

## 2018-12-31 ENCOUNTER — Encounter: Payer: Self-pay | Admitting: Internal Medicine

## 2018-12-31 DIAGNOSIS — E785 Hyperlipidemia, unspecified: Secondary | ICD-10-CM | POA: Diagnosis not present

## 2018-12-31 DIAGNOSIS — M810 Age-related osteoporosis without current pathological fracture: Secondary | ICD-10-CM

## 2018-12-31 DIAGNOSIS — G40909 Epilepsy, unspecified, not intractable, without status epilepticus: Secondary | ICD-10-CM

## 2018-12-31 DIAGNOSIS — I10 Essential (primary) hypertension: Secondary | ICD-10-CM | POA: Diagnosis not present

## 2018-12-31 DIAGNOSIS — F418 Other specified anxiety disorders: Secondary | ICD-10-CM

## 2018-12-31 NOTE — Progress Notes (Signed)
Location:  [facility] Room Number: 145 D Place of Service:  SNF (31) Provider:   , MD  Simpson, Margaret E, MD  Patient Care Team: Simpson, Margaret E, MD as PCP - General Fields, Sandi L, MD (Gastroenterology)  Extended Emergency Contact Information Primary Emergency Contact: Coss,Alfreda Address: [address]          [place], [place] [address] [place] Home Phone: [phone] Mobile Phone: [phone] Relation: Daughter Secondary Emergency Contact: Levene,Alfred  [place] Mobile Phone: [phone] Relation: Son  Code Status:  Full Code Goals of care: Advanced Directive information Advanced Directives 11/24/2018  Does Patient Have a Medical Advance Directive? Yes  Type of Advance Directive -  Does patient want to make changes to medical advance directive? No - Patient declined  Copy of Healthcare Power of Attorney in Chart? -  Would patient like information on creating a medical advance directive? No - Patient declined  Pre-existing out of facility DNR order (yellow form or pink MOST form) -     Chief Complaint  Patient presents with  . Medical Management of Chronic Issues    Hypertension, Depression with Anxiety    HPI:  Pt is a 85 y.o. female seen today for medical management of chronic diseases.    Patient has h/o Hypertension, Seizure disorder, Anemia, Chronic Venous stasis, and Arthritis, Chronic Bronchitis, Osteoporosis s/p Femoral neck fracture And depression She is Long term resident of facility. She has been doing well.  Denies any new issues.  No new nursing issues Discussed with the nurses She has lost some weight but she says she is trying to loose weight  Past Medical History:  Diagnosis Date  . Diverticulosis OCT 2010 TCS   SIGMOID COLON  . Esophageal web 2010   DILATION 16 MM  . Gastritis, Helicobacter pylori 2010   Rx. ABO for 10 days   . GERD (gastroesophageal  reflux disease)   . Hip fracture, left ([facility])    2016  . Hyperlipidemia   . Hypertension   . Restless legs syndrome   . Seizures ([facility]) January 2010   Post head trauma in fall .sees Dr [place]   Past Surgical History:  Procedure Laterality Date  . ABDOMINAL HYSTERECTOMY    . COLONOSCOPY  OCT 2010   TORTUOUS, Sml IH, Tics, MULTIPLE SIMPLE ADENOMAS (<6MM)  . HIP ARTHROPLASTY Left 11/19/2014   Procedure: LEFT PARTIAL HIP REPLACEMENT;  Surgeon: Stanley E Harrison, MD;  Location: [facility];  Service: Orthopedics;  Laterality: Left;  . ORIF HIP FRACTURE  04/05/2012   Procedure: OPEN REDUCTION INTERNAL FIXATION HIP;  Surgeon: Stanley E Harrison, MD;  Location: [facility];  Service: Orthopedics;  Laterality: Right;  . UPPER GASTROINTESTINAL ENDOSCOPY  AUG 2010   SAVARY  . VESICOVAGINAL FISTULA CLOSURE W/ TAH  1966   bleeding     No Known Allergies  Outpatient Encounter Medications as of 12/31/2018  Medication Sig  . acetaminophen (TYLENOL) 650 MG CR tablet Take 1,300 mg by mouth daily.   . alendronate (FOSAMAX) 70 MG tablet Take with full glass of water once a day on Sunday  . ammonium lactate (LAC-HYDRIN) 12 % lotion Apply to right heel once a day  . aspirin 81 MG chewable tablet Chew 81 mg by mouth daily.  . calcium-vitamin D (OSCAL WITH D) 500-200 MG-UNIT TABS TAKE (1) TABLET BY MOUTH (3) TIMES DAILY WITH MEALS.  . docusate sodium (COLACE) 100 MG capsule Take 200 mg by   mouth every evening.   . FLUoxetine (PROZAC) 10 MG tablet Take 1 tablet 10 mg by mouth along with 20 mg to equal 30 mg once a day  . FLUoxetine (PROZAC) 20 MG tablet Take 1 tablet 20 mg along with 10 mg to equal 30 mg by mouth once a day  . Glycopyrrolate (LONHALA MAGNAIR STARTER KIT) 25 MCG/ML SOLN Inhale 25 mcg/mL into the lungs 2 (two) times daily.  . hydrochlorothiazide (HYDRODIURIL) 12.5 MG tablet Take 12.5 mg by mouth daily.  . ipratropium-albuterol (DUONEB) 0.5-2.5 (3) MG/3ML SOLN Take 3 mLs by nebulization every 6 (six)  hours as needed.  . levETIRAcetam (KEPPRA) 500 MG tablet TAKE 1 TABLET BY MOUTH TWICE A DAY AS DIRECTED.  . lovastatin (MEVACOR) 20 MG tablet Take 1 tablet (20 mg total) by mouth at bedtime.  . Melatonin 5 MG TABS Take 5 mg by mouth at bedtime.  . Multiple Vitamin (MULTIVITAMIN) tablet Take 1 tablet by mouth daily.  . NON FORMULARY Diet type:  NAS  . polyethylene glycol (MIRALAX / GLYCOLAX) packet Take 17 g by mouth daily as needed.   . potassium chloride (K-DUR,KLOR-CON) 10 MEQ tablet Take 10 mEq by mouth daily.  . senna (SENOKOT) 8.6 MG TABS tablet Take 2 tablets by mouth at bedtime.   . SYMBICORT 80-4.5 MCG/ACT inhaler Inhale 2 puffs into the lungs 2 (two) times daily.    No facility-administered encounter medications on file as of 12/31/2018.     Review of Systems  Constitutional: Negative.   HENT: Negative.   Respiratory: Negative.   Cardiovascular: Negative.   Genitourinary: Negative.   Musculoskeletal: Negative.   Skin: Negative.   Neurological: Positive for weakness.  Psychiatric/Behavioral: Positive for sleep disturbance.    Immunization History  Administered Date(s) Administered  . Influenza Split 07/25/2012  . Influenza,inj,Quad PF,6+ Mos 07/30/2013, 08/18/2014  . Influenza-Unspecified 07/06/2016, 07/12/2017, 07/11/2018  . PPD Test 12/06/2014  . Pneumococcal Conjugate-13 04/29/2014  . Pneumococcal Polysaccharide-23 07/25/2012  . Pneumococcal-Unspecified 08/01/2016  . Tdap 08/08/2011, 06/20/2017   Pertinent  Health Maintenance Due  Topic Date Due  . INFLUENZA VACCINE  Completed  . DEXA SCAN  Completed  . PNA vac Low Risk Adult  Completed   Fall Risk  07/14/2018 07/04/2017 04/29/2014 07/30/2013  Falls in the past year? No No Yes Yes  Number falls in past yr: - - 1 2 or more  Injury with Fall? - - No -  Risk for fall due to : - - - Impaired balance/gait;Impaired mobility;History of fall(s)   Functional Status Survey:    Vitals:   12/31/18 1328  BP: (!) 143/83   Pulse: 65  Resp: 18  Temp: (!) 97 F (36.1 C)  Weight: 248 lb 9.6 oz (112.8 kg)  Height: 5' 7" (1.702 m)   Body mass index is 38.94 kg/m. Physical Exam Constitutional:      Appearance: She is well-developed.  HENT:     Head: Normocephalic.  Eyes:     Pupils: Pupils are equal, round, and reactive to light.  Neck:     Musculoskeletal: Normal range of motion.  Cardiovascular:     Rate and Rhythm: Normal rate and regular rhythm.     Heart sounds: No murmur.  Pulmonary:     Effort: Pulmonary effort is normal. No respiratory distress.     Breath sounds: No wheezing or rales.  Abdominal:     General: Bowel sounds are normal. There is no distension.     Palpations: Abdomen is soft.       Tenderness: There is no abdominal tenderness. There is no guarding.  Musculoskeletal:     Comments: Trace edema Bilateral  Lymphadenopathy:     Cervical: No cervical adenopathy.  Skin:    General: Skin is warm and dry.  Neurological:     Mental Status: She is alert.     Comments: She has very Flat effect. She is mostly Wheelchair bound but can do some transfers. She walks in the facility with assist.  Psychiatric:        Thought Content: Thought content normal.     Labs reviewed: Recent Labs    05/23/18 0728 07/11/18 0700 09/25/18 0750  NA 142 143 141  K 4.0 3.7 4.0  CL 101 101 104  CO2 31 32 30  GLUCOSE 97 104* 97  BUN 15 16 21  CREATININE 0.78 0.90 0.66  CALCIUM 8.7* 9.2 8.9   Recent Labs    02/25/18 0707  AST 26  ALT 19  ALKPHOS 66  BILITOT 0.6  PROT 7.7  ALBUMIN 4.2   Recent Labs    01/31/18 0741 05/23/18 0728 07/11/18 0700 09/25/18 0750  WBC 5.0 5.9 6.4 5.1  NEUTROABS 2.4 2.5  --  2.2  HGB 12.4 13.3 14.1 13.2  HCT 38.7 41.3 42.9 42.0  MCV 92.4 93.0 93.1 94.2  PLT 160 166 182 177   Lab Results  Component Value Date   TSH 1.590 06/19/2017   Lab Results  Component Value Date   HGBA1C 5.5 01/31/2018   Lab Results  Component Value Date   CHOL 140  02/25/2018   HDL 50 02/25/2018   LDLCALC 76 02/25/2018   TRIG 70 02/25/2018   CHOLHDL 2.8 02/25/2018    Significant Diagnostic Results in last 30 days:  No results found.  Assessment/Plan COPD Stable on Symbicort and Glycopyrrolate Essential HTN On HCTZ and stable Seizure disorder .Stableon Keppra Depression with anxiety Stable on Prozac. Follows with psych services Hyperlipidemia, LDL was 76 in 05/19 Continue lovastatin.  Repeat Lipid Panel  Constipation On senna and Colace  Insomnia Continue lon melatonin 5 mg No Changes for now. Osteoporosis, senile Patient os on Chronic Fosamax therapy. . It seems since 2014. Serum Calcium Normal. Repeat Dexa scan showed T score of -1.655 Will now discontinue Fosamax Left Axilla Soft tissue swelling Was Benign on Imaging Family/ staff Communication:   Labs/tests ordered:     

## 2019-01-02 ENCOUNTER — Encounter (HOSPITAL_COMMUNITY)
Admission: RE | Admit: 2019-01-02 | Discharge: 2019-01-02 | Disposition: A | Discharge status: Home / Self Care | Payer: Medicare Other | Source: Skilled Nursing Facility | Attending: Internal Medicine | Admitting: Internal Medicine

## 2019-01-02 DIAGNOSIS — R262 Difficulty in walking, not elsewhere classified: Secondary | ICD-10-CM | POA: Insufficient documentation

## 2019-01-02 DIAGNOSIS — F0390 Unspecified dementia without behavioral disturbance: Secondary | ICD-10-CM | POA: Insufficient documentation

## 2019-01-02 DIAGNOSIS — M810 Age-related osteoporosis without current pathological fracture: Secondary | ICD-10-CM | POA: Insufficient documentation

## 2019-01-02 LAB — BASIC METABOLIC PANEL
Anion gap: 6 (ref 5–15)
BUN: 24 mg/dL — AB (ref 8–23)
CHLORIDE: 105 mmol/L (ref 98–111)
CO2: 31 mmol/L (ref 22–32)
CREATININE: 0.72 mg/dL (ref 0.44–1.00)
Calcium: 8.7 mg/dL — ABNORMAL LOW (ref 8.9–10.3)
GFR calc Af Amer: >60 mL/min (ref >60)
GFR calc non Af Amer: >60 mL/min (ref >60)
GLUCOSE: 98 mg/dL (ref 70–99)
Potassium: 4.2 mmol/L (ref 3.5–5.1)
Sodium: 142 mmol/L (ref 135–145)

## 2019-01-02 LAB — LIPID PANEL
CHOLESTEROL: 145 mg/dL (ref 0–200)
HDL: 47 mg/dL (ref >40)
LDL Cholesterol: 87 mg/dL (ref 0–99)
Total CHOL/HDL Ratio: 3.1 RATIO
Triglycerides: 56 mg/dL (ref <150)
VLDL: 11 mg/dL (ref 0–40)

## 2019-01-02 LAB — CBC WITH DIFFERENTIAL/PLATELET
Abs Immature Granulocytes: 0 10*3/uL (ref 0.00–0.07)
BASOS PCT: 1 %
Basophils Absolute: 0 10*3/uL (ref 0.0–0.1)
EOS PCT: 3 %
Eosinophils Absolute: 0.1 10*3/uL (ref 0.0–0.5)
HCT: 39.9 % (ref 36.0–46.0)
Hemoglobin: 12.7 g/dL (ref 12.0–15.0)
Immature Granulocytes: 0 %
Lymphocytes Relative: 39 %
Lymphs Abs: 2.1 10*3/uL (ref 0.7–4.0)
MCH: 29.8 pg (ref 26.0–34.0)
MCHC: 31.8 g/dL (ref 30.0–36.0)
MCV: 93.7 fL (ref 80.0–100.0)
MONO ABS: 0.7 10*3/uL (ref 0.1–1.0)
MONOS PCT: 13 %
Neutro Abs: 2.4 10*3/uL (ref 1.7–7.7)
Neutrophils Relative %: 44 %
Platelets: 162 10*3/uL (ref 150–400)
RBC: 4.26 MIL/uL (ref 3.87–5.11)
RDW: 14 % (ref 11.5–15.5)
WBC: 5.3 10*3/uL (ref 4.0–10.5)
nRBC: 0 % (ref 0.0–0.2)

## 2019-01-30 ENCOUNTER — Encounter: Payer: Self-pay | Admitting: Adult Health

## 2019-01-30 ENCOUNTER — Non-Acute Institutional Stay (SKILLED_NURSING_FACILITY): Payer: Medicare Other | Admitting: Adult Health

## 2019-01-30 DIAGNOSIS — I82539 Chronic embolism and thrombosis of unspecified popliteal vein: Secondary | ICD-10-CM | POA: Diagnosis not present

## 2019-01-30 DIAGNOSIS — I10 Essential (primary) hypertension: Secondary | ICD-10-CM

## 2019-01-30 DIAGNOSIS — G40909 Epilepsy, unspecified, not intractable, without status epilepticus: Secondary | ICD-10-CM

## 2019-01-30 NOTE — Progress Notes (Signed)
Location:   [facility] Room Number: 145 D Place of Service:  SNF (31)   CODE STATUS: Full Code  No Known Allergies  Chief Complaint  Patient presents with  . Medical Management of Chronic Issues    Chronic deep vein thrombosis (DVT) of popliteal vein unspecified laterality; essential hypertension; seizure disorder.     HPI:  She is a 85 year old long term resident of this facility being seen for the management of her chronic illnesses: DVT: hypertension; seizure. She denies any uncontrolled pain; no changes in appetite; no depressive or anxious thoughts. There are no fevers reported.   Past Medical History:  Diagnosis Date  . Diverticulosis OCT 2010 TCS   SIGMOID COLON  . Esophageal web 2010   DILATION 16 MM  . Gastritis, Helicobacter pylori 3893   Rx. ABO for 10 days   . GERD (gastroesophageal reflux disease)   . Hip fracture, left ([place])    2016  . Hyperlipidemia   . Hypertension   . Restless legs syndrome   . Seizures [facility]) January 2010   Post head trauma in fall .sees [name]    Past Surgical History:  Procedure Laterality Date  . ABDOMINAL HYSTERECTOMY    . COLONOSCOPY  OCT 2010   TORTUOUS, Sml IH, Tics, MULTIPLE SIMPLE ADENOMAS (<6MM)  . HIP ARTHROPLASTY Left 11/19/2014   Procedure: LEFT PARTIAL HIP REPLACEMENT;  Surgeon: [name], MD;  Location: [facility];  Service: Orthopedics;  Laterality: Left;  . ORIF HIP FRACTURE  04/05/2012   Procedure: OPEN REDUCTION INTERNAL FIXATION HIP;  Surgeon: [name], MD;  Location: [facility];  Service: Orthopedics;  Laterality: Right;  . UPPER GASTROINTESTINAL ENDOSCOPY  AUG 2010   SAVARY  . VESICOVAGINAL FISTULA CLOSURE W/ TAH  1966   bleeding     Social History   Socioeconomic History  . Marital status: Widowed    Spouse name: Not on file  . Number of children: Not on file  . Years of education: Not on file  . Highest education level: Not on file  Occupational History  .  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[employer]   Social Needs  . Financial resource strain: Not hard at all  . Food insecurity:    Worry: Never true    Inability: Never true  . Transportation needs:    Medical: No    Non-medical: No  Tobacco Use  . Smoking status: Never Smoker  . Smokeless tobacco: Never Used  Substance and Sexual Activity  . Alcohol use: No  . Drug use: No  . Sexual activity: Never  Lifestyle  . Physical activity:    Days per week: 4 days    Minutes per session: 20 min  . Stress: To some extent  Relationships  . Social connections:    Talks on phone: Twice a week    Gets together: Once a week    Attends religious service: Never    Active member of club or organization: No    Attends meetings of clubs or organizations: Never    Relationship status: Widowed  . Intimate partner violence:    Fear of current or ex partner: No    Emotionally abused: No    Physically abused: No    Forced sexual activity: No  Other Topics Concern  . Not on file  Social History Narrative  . Not on file   Family History  Problem Relation Age of Onset  . Cancer Mother  bladder   . Diabetes Mother   . Diabetes Father   . Emphysema Father   . GER disease Brother   . Colon cancer Neg Hx   . Colon polyps Neg Hx       VITAL SIGNS BP 108/66   Pulse 74   Temp 98.1 F (36.7 C)   Resp 18   Ht '5\' 7"'$  (1.702 m)   Wt 250 lb 3.2 oz (113.5 kg)   BMI 39.19 kg/m   Outpatient Encounter Medications as of 01/30/2019  Medication Sig  . acetaminophen (TYLENOL) 650 MG CR tablet Take 1,300 mg by mouth daily.   Marland Kitchen ammonium lactate (LAC-HYDRIN) 12 % lotion Apply to right heel once a day  . aspirin 81 MG chewable tablet Chew 81 mg by mouth daily.  . calcium-vitamin D (OSCAL WITH D) 500-200 MG-UNIT TABS TAKE (1) TABLET BY MOUTH (3) TIMES DAILY WITH MEALS.  Marland Kitchen docusate sodium (COLACE) 100 MG capsule Take 200 mg by mouth every evening.   Marland Kitchen FLUoxetine (PROZAC) 10 MG tablet  Take 1 tablet 10 mg by mouth along with 20 mg to equal 30 mg once a day  . FLUoxetine (PROZAC) 20 MG tablet Take 1 tablet 20 mg along with 10 mg to equal 30 mg by mouth once a day  . Glycopyrrolate (LONHALA MAGNAIR STARTER KIT) 25 MCG/ML SOLN Inhale 25 mcg/mL into the lungs 2 (two) times daily.  . hydrochlorothiazide (HYDRODIURIL) 12.5 MG tablet Take 12.5 mg by mouth daily.  Marland Kitchen ipratropium-albuterol (DUONEB) 0.5-2.5 (3) MG/3ML SOLN Take 3 mLs by nebulization every 6 (six) hours as needed.  . levETIRAcetam (KEPPRA) 500 MG tablet TAKE 1 TABLET BY MOUTH TWICE A DAY AS DIRECTED.  Marland Kitchen lovastatin (MEVACOR) 20 MG tablet Take 1 tablet (20 mg total) by mouth at bedtime.  . Melatonin 5 MG TABS Take 5 mg by mouth at bedtime.  . Multiple Vitamin (MULTIVITAMIN) tablet Take 1 tablet by mouth daily.  . NON FORMULARY Diet type:  NAS  . polyethylene glycol (MIRALAX / GLYCOLAX) packet Take 17 g by mouth daily as needed.   . potassium chloride (K-DUR,KLOR-CON) 10 MEQ tablet Take 10 mEq by mouth daily.  Marland Kitchen senna (SENOKOT) 8.6 MG TABS tablet Take 2 tablets by mouth at bedtime.   . SYMBICORT 80-4.5 MCG/ACT inhaler Inhale 2 puffs into the lungs 2 (two) times daily.   . [DISCONTINUED] alendronate (FOSAMAX) 70 MG tablet Take with full glass of water once a day on Sunday   No facility-administered encounter medications on file as of 01/30/2019.      SIGNIFICANT DIAGNOSTIC EXAMS  LABS REVIEWED PREVIOUS  02-25-18: chol 140; ldl 76; trig 70; hdl 50; 09-25-18" wbc  5.1; hgb 13.2; hct 42.0; mcv 94.2; plt 177; glucose 97; bun 21; creat 0.66; k+ 4.0; na++ 141 ca 8.9  TODAY:   01-02-19: wbc 5.3 hgb 12.7; hct 39.9; mcv 93.7; plt 162; glucose 98; bun 24; creat 0.72; k+ 4.2; na++142 ca 8.7 chol 145 ldl 87; tri 56; hdl 47    Review of Systems  Constitutional: Negative for malaise/fatigue.  Respiratory: Negative for cough and shortness of breath.   Cardiovascular: Negative for chest pain, palpitations and leg swelling.   Gastrointestinal: Negative for abdominal pain, constipation and heartburn.  Musculoskeletal: Negative for back pain, joint pain and myalgias.  Skin: Negative.   Neurological: Negative for dizziness.  Psychiatric/Behavioral: The patient is not nervous/anxious.      Physical Exam Constitutional:      General: She is not  in acute distress.    Appearance: She is obese. She is not diaphoretic.  Neck:     Musculoskeletal: Neck supple.     Thyroid: No thyromegaly.  Cardiovascular:     Rate and Rhythm: Normal rate and regular rhythm.     Pulses: Normal pulses.     Heart sounds: Normal heart sounds.  Pulmonary:     Effort: Pulmonary effort is normal. No respiratory distress.     Breath sounds: Normal breath sounds.  Abdominal:     General: Bowel sounds are normal. There is no distension.     Palpations: Abdomen is soft.     Tenderness: There is no abdominal tenderness.  Musculoskeletal:     Right lower leg: Edema present.     Left lower leg: Edema present.     Comments: Trace bilateral lower extremity edema Is able to move all extremities Uses wheelchair  2016: left hip fracture left hip replacement    Lymphadenopathy:     Cervical: No cervical adenopathy.  Skin:    General: Skin is warm and dry.  Neurological:     Mental Status: She is alert. Mental status is at baseline.  Psychiatric:        Mood and Affect: Mood normal.       ASSESSMENT/ PLAN:  TODAY:   1. Seizure disorder: is stable no reports of seizure activity will continue keppra 500 mg twice daily   2. Chronic deep vein thrombosis (DVT) popliteal vein unspecified laterality: is stable will continue asa 81 mg daily   3. Essential hypertension: is stable b/p 108/66 will continue hctz 12.5 mg daily    PREVIOUS  4. Essential hypertension: b/p 108/66: is stable will continue hctz 12.5 mg daily   5.  Simple chronic bronchitis: is stable symbicort 80/4,5 mcg 2 puffs twice daily and glycopyrrolate 25 mcg neb twice  daily has duoneb every 6 hours as needed  6. Chronic constipation: is stable will continue colace 200 mg daily; senna 2 tabs; miralax daily as needed   7. Hypokalemia: is stable k+ 4.2; will continue k+ 10 meq daily   8. Unspecified hyperlipidemia: is stable LDL 87; will continue mevacor 20 mg daily  9. Unspecified obsessive compulsive disorder: is stable will continue prozac 30 mg daily and take melatonin 5 mg nightly   10. Chronic generalized pain: stable will continue tylenol 1300 mg daily    MD is aware of resident's narcotic use and is in agreement with current plan of care. We will attempt to wean resident as apropriate   Ok Edwards NP [facility]  Contact [phone] Monday through Friday 8am- 5pm  After hours call [phone]

## 2019-03-03 ENCOUNTER — Encounter: Payer: Self-pay | Admitting: Adult Health

## 2019-03-03 ENCOUNTER — Non-Acute Institutional Stay (SKILLED_NURSING_FACILITY): Payer: Medicare Other | Admitting: Adult Health

## 2019-03-03 DIAGNOSIS — J410 Simple chronic bronchitis: Secondary | ICD-10-CM | POA: Diagnosis not present

## 2019-03-03 DIAGNOSIS — K5909 Other constipation: Secondary | ICD-10-CM | POA: Diagnosis not present

## 2019-03-03 DIAGNOSIS — E876 Hypokalemia: Secondary | ICD-10-CM

## 2019-03-03 NOTE — Progress Notes (Signed)
Location:   [facility] Room Number: 145 D Place of Service:  SNF (31)   CODE STATUS: Full Code  No Known Allergies  Chief Complaint  Patient presents with  . Medical Management of Chronic Issues    Simple chronic bronchitis; chronic constipation; hypokalemia     HPI:  She is a 85 year old long term resident of this facility being seen for the management of his chronic illnesses: bronchitis; constipation; hypokalemia. She denies any constipation; no cough or shortness of breath. No uncontrolled pain. There are no reports of fevers present.   Past Medical History:  Diagnosis Date  . Diverticulosis OCT 2010 TCS   SIGMOID COLON  . Esophageal web 2010   DILATION 16 MM  . Gastritis, Helicobacter pylori 0973   Rx. ABO for 10 days   . GERD (gastroesophageal reflux disease)   . Hip fracture, left ([place])    2016  . Hyperlipidemia   . Hypertension   . Restless legs syndrome   . Seizures [facility]) January 2010   Post head trauma in fall .sees [name]    Past Surgical History:  Procedure Laterality Date  . ABDOMINAL HYSTERECTOMY    . COLONOSCOPY  OCT 2010   TORTUOUS, Sml IH, Tics, MULTIPLE SIMPLE ADENOMAS (<6MM)  . HIP ARTHROPLASTY Left 11/19/2014   Procedure: LEFT PARTIAL HIP REPLACEMENT;  Surgeon: [name], MD;  Location: [facility];  Service: Orthopedics;  Laterality: Left;  . ORIF HIP FRACTURE  04/05/2012   Procedure: OPEN REDUCTION INTERNAL FIXATION HIP;  Surgeon: [name], MD;  Location: [facility];  Service: Orthopedics;  Laterality: Right;  . UPPER GASTROINTESTINAL ENDOSCOPY  AUG 2010   SAVARY  . VESICOVAGINAL FISTULA CLOSURE W/ TAH  1966   bleeding     Social History   Socioeconomic History  . Marital status: Widowed    Spouse name: Not on file  . Number of children: Not on file  . Years of education: Not on file  . Highest education level: Not on file  Occupational History  . Occupation: custodian / nutritionist  -[employer]   Social Needs  . Financial resource strain: Not hard at all  . Food insecurity:    Worry: Never true    Inability: Never true  . Transportation needs:    Medical: No    Non-medical: No  Tobacco Use  . Smoking status: Never Smoker  . Smokeless tobacco: Never Used  Substance and Sexual Activity  . Alcohol use: No  . Drug use: No  . Sexual activity: Never  Lifestyle  . Physical activity:    Days per week: 4 days    Minutes per session: 20 min  . Stress: To some extent  Relationships  . Social connections:    Talks on phone: Twice a week    Gets together: Once a week    Attends religious service: Never    Active member of club or organization: No    Attends meetings of clubs or organizations: Never    Relationship status: Widowed  . Intimate partner violence:    Fear of current or ex partner: No    Emotionally abused: No    Physically abused: No    Forced sexual activity: No  Other Topics Concern  . Not on file  Social History Narrative  . Not on file   Family History  Problem Relation Age of Onset  . Cancer Mother  bladder   . Diabetes Mother   . Diabetes Father   . Emphysema Father   . GER disease Brother   . Colon cancer Neg Hx   . Colon polyps Neg Hx       VITAL SIGNS BP 127/78   Pulse 68   Temp 97.9 F (36.6 C)   Resp 20   Ht '5\' 7"'$  (1.702 m)   Wt 250 lb (113.4 kg)   BMI 39.16 kg/m   Outpatient Encounter Medications as of 03/03/2019  Medication Sig  . acetaminophen (TYLENOL) 650 MG CR tablet Take 1,300 mg by mouth daily.   Marland Kitchen ammonium lactate (LAC-HYDRIN) 12 % lotion Apply to right heel once a day  . aspirin 81 MG chewable tablet Chew 81 mg by mouth daily.  . calcium-vitamin D (OSCAL WITH D) 500-200 MG-UNIT TABS TAKE (1) TABLET BY MOUTH (3) TIMES DAILY WITH MEALS.  Marland Kitchen docusate sodium (COLACE) 100 MG capsule Take 200 mg by mouth every evening.   Marland Kitchen FLUoxetine (PROZAC) 10 MG tablet Take 1 tablet 10 mg by mouth along with 20  mg to equal 30 mg once a day  . FLUoxetine (PROZAC) 20 MG tablet Take 1 tablet 20 mg along with 10 mg to equal 30 mg by mouth once a day  . Glycopyrrolate (LONHALA MAGNAIR STARTER KIT) 25 MCG/ML SOLN Inhale 25 mcg/mL into the lungs 2 (two) times daily.  . hydrochlorothiazide (HYDRODIURIL) 12.5 MG tablet Take 12.5 mg by mouth daily.  Marland Kitchen ipratropium-albuterol (DUONEB) 0.5-2.5 (3) MG/3ML SOLN Take 3 mLs by nebulization every 6 (six) hours as needed.  . levETIRAcetam (KEPPRA) 500 MG tablet TAKE 1 TABLET BY MOUTH TWICE A DAY AS DIRECTED.  Marland Kitchen lovastatin (MEVACOR) 20 MG tablet Take 1 tablet (20 mg total) by mouth at bedtime.  . Melatonin 5 MG TABS Take 5 mg by mouth at bedtime.  . Multiple Vitamin (MULTIVITAMIN) tablet Take 1 tablet by mouth daily.  . NON FORMULARY Diet type:  NAS  . polyethylene glycol (MIRALAX / GLYCOLAX) packet Take 17 g by mouth daily as needed.   . potassium chloride (K-DUR,KLOR-CON) 10 MEQ tablet Take 10 mEq by mouth daily.  Marland Kitchen senna (SENOKOT) 8.6 MG TABS tablet Take 2 tablets by mouth at bedtime.   . SYMBICORT 80-4.5 MCG/ACT inhaler Inhale 2 puffs into the lungs 2 (two) times daily.    No facility-administered encounter medications on file as of 03/03/2019.      SIGNIFICANT DIAGNOSTIC EXAMS  LABS REVIEWED PREVIOUS  02-25-18: chol 140; ldl 76; trig 70; hdl 50; 09-25-18" wbc  5.1; hgb 13.2; hct 42.0; mcv 94.2; plt 177; glucose 97; bun 21; creat 0.66; k+ 4.0; na++ 141 ca 8.9 01-02-19: wbc 5.3 hgb 12.7; hct 39.9; mcv 93.7; plt 162; glucose 98; bun 24; creat 0.72; k+ 4.2; na++142 ca 8.7 chol 145 ldl 87; tri 56; hdl 47   NO NEW LABS.   Review of Systems  Constitutional: Negative for malaise/fatigue.  Respiratory: Negative for cough and shortness of breath.   Cardiovascular: Negative for chest pain, palpitations and leg swelling.  Gastrointestinal: Negative for abdominal pain, constipation and heartburn.  Musculoskeletal: Negative for back pain, joint pain and myalgias.  Skin:  Negative.   Neurological: Negative for dizziness.  Psychiatric/Behavioral: The patient is not nervous/anxious.     Physical Exam Constitutional:      General: She is not in acute distress.    Appearance: She is well-developed. She is obese. She is not diaphoretic.  Neck:  Thyroid: No thyromegaly.  Cardiovascular:     Rate and Rhythm: Normal rate and regular rhythm.     Heart sounds: Normal heart sounds.  Pulmonary:     Effort: Pulmonary effort is normal. No respiratory distress.     Breath sounds: Normal breath sounds.  Abdominal:     General: Bowel sounds are normal. There is no distension.     Palpations: Abdomen is soft.     Tenderness: There is no abdominal tenderness.  Musculoskeletal:     Left lower leg: Edema present.     Comments: Trace bilateral lower extremity edema Is able to move all extremities Uses wheelchair  2016: left hip fracture left hip replacement     Lymphadenopathy:     Cervical: No cervical adenopathy.  Skin:    General: Skin is warm and dry.  Neurological:     Mental Status: She is alert. Mental status is at baseline.  Psychiatric:        Mood and Affect: Mood normal.      ASSESSMENT/ PLAN:  TODAY:   1. Simple chronic bronchitis: is stable will continue symbicort 80/4.5 mcg 2 puffs twice daily glycopyrrolate 25 mcg neb twice daily and has duoneb every 6 hours as needed  2. Chronic constipation: is stable will continue colace 200 mg daily senna 2 tabs daily and miralax daily as needed   3. Hypokalemia: is stable k+ 4.2 will continue k+ 10 meq daily   PREVIOUS  4. Unspecified hyperlipidemia: is stable LDL 87; will continue mevacor 20 mg daily  5. Unspecified obsessive compulsive disorder: is stable will continue prozac 30 mg daily and take melatonin 5 mg nightly   6. Chronic generalized pain: stable will continue tylenol 1300 mg daily   7. Seizure disorder: is stable no reports of seizure activity will continue keppra 500 mg twice  daily   8. Chronic deep vein thrombosis (DVT) popliteal vein unspecified laterality: is stable will continue asa 81 mg daily   9. Essential hypertension: is stable b/p 127/78  will continue hctz 12.5 mg daily       MD is aware of resident's narcotic use and is in agreement with current plan of care. We will attempt to wean resident as apropriate   Ok Edwards NP [facility]  Contact [phone] Monday through Friday 8am- 5pm  After hours call [phone]

## 2019-03-30 ENCOUNTER — Non-Acute Institutional Stay (SKILLED_NURSING_FACILITY): Payer: Medicare Other | Admitting: Adult Health

## 2019-03-30 ENCOUNTER — Encounter: Payer: Self-pay | Admitting: Adult Health

## 2019-03-30 DIAGNOSIS — E785 Hyperlipidemia, unspecified: Secondary | ICD-10-CM | POA: Diagnosis not present

## 2019-03-30 DIAGNOSIS — F429 Obsessive-compulsive disorder, unspecified: Secondary | ICD-10-CM | POA: Diagnosis not present

## 2019-03-30 DIAGNOSIS — R52 Pain, unspecified: Secondary | ICD-10-CM

## 2019-03-30 DIAGNOSIS — G8929 Other chronic pain: Secondary | ICD-10-CM | POA: Diagnosis not present

## 2019-03-30 NOTE — Progress Notes (Signed)
Location:   [facility] Room Number: 145 D Place of Service:  SNF (31)   CODE STATUS: Full Code  No Known Allergies  Chief Complaint  Patient presents with  . Medical Management of Chronic Issues       Unspecified hyperlipidemia: Unspecified obsessive compulsive disorder: Chronic generalized pain     HPI:  She is a 85 year old long term resident of this facility being seen for the management of her chronic illnesses; hyperlipidemia; ocd; pain. She denies any uncontrolled pain; states that she is sleeping good at night; no anxiety or depressive thoughts. No reports of fevers present.   Past Medical History:  Diagnosis Date  . Diverticulosis OCT 2010 TCS   SIGMOID COLON  . Esophageal web 2010   DILATION 16 MM  . Gastritis, Helicobacter pylori 2671   Rx. ABO for 10 days   . GERD (gastroesophageal reflux disease)   . Hip fracture, left ([place])    2016  . Hyperlipidemia   . Hypertension   . Restless legs syndrome   . Seizures [facility]) January 2010   Post head trauma in fall .sees [name]    Past Surgical History:  Procedure Laterality Date  . ABDOMINAL HYSTERECTOMY    . COLONOSCOPY  OCT 2010   TORTUOUS, Sml IH, Tics, MULTIPLE SIMPLE ADENOMAS (<6MM)  . HIP ARTHROPLASTY Left 11/19/2014   Procedure: LEFT PARTIAL HIP REPLACEMENT;  Surgeon: [name], MD;  Location: [facility];  Service: Orthopedics;  Laterality: Left;  . ORIF HIP FRACTURE  04/05/2012   Procedure: OPEN REDUCTION INTERNAL FIXATION HIP;  Surgeon: [name], MD;  Location: [facility];  Service: Orthopedics;  Laterality: Right;  . UPPER GASTROINTESTINAL ENDOSCOPY  AUG 2010   SAVARY  . VESICOVAGINAL FISTULA CLOSURE W/ TAH  1966   bleeding     Social History   Socioeconomic History  . Marital status: Widowed    Spouse name: Not on file  . Number of children: Not on file  . Years of education: Not on file  . Highest education level: Not on file  Occupational History  .  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[employer]   Social Needs  . Financial resource strain: Not hard at all  . Food insecurity    Worry: Never true    Inability: Never true  . Transportation needs    Medical: No    Non-medical: No  Tobacco Use  . Smoking status: Never Smoker  . Smokeless tobacco: Never Used  Substance and Sexual Activity  . Alcohol use: No  . Drug use: No  . Sexual activity: Never  Lifestyle  . Physical activity    Days per week: 4 days    Minutes per session: 20 min  . Stress: To some extent  Relationships  . Social Herbalist on phone: Twice a week    Gets together: Once a week    Attends religious service: Never    Active member of club or organization: No    Attends meetings of clubs or organizations: Never    Relationship status: Widowed  . Intimate partner violence    Fear of current or ex partner: No    Emotionally abused: No    Physically abused: No    Forced sexual activity: No  Other Topics Concern  . Not on file  Social History Narrative  . Not on file   Family History  Problem Relation Age of Onset  .  Cancer Mother        bladder   . Diabetes Mother   . Diabetes Father   . Emphysema Father   . GER disease Brother   . Colon cancer Neg Hx   . Colon polyps Neg Hx       VITAL SIGNS BP 134/76   Pulse 75   Temp 98.1 F (36.7 C)   Resp 18   Ht '5\' 7"'$  (1.702 m)   Wt 246 lb 3.2 oz (111.7 kg)   BMI 38.56 kg/m   Outpatient Encounter Medications as of 03/30/2019  Medication Sig  . acetaminophen (TYLENOL) 650 MG CR tablet Take 1,300 mg by mouth daily.   Marland Kitchen ammonium lactate (LAC-HYDRIN) 12 % lotion Apply to right heel once a day  . aspirin 81 MG chewable tablet Chew 81 mg by mouth daily.  . calcium-vitamin D (OSCAL WITH D) 500-200 MG-UNIT TABS TAKE (1) TABLET BY MOUTH (3) TIMES DAILY WITH MEALS.  Marland Kitchen docusate sodium (COLACE) 100 MG capsule Take 200 mg by mouth every evening.   Marland Kitchen FLUoxetine (PROZAC) 10 MG tablet Take  1 tablet 10 mg by mouth along with 20 mg to equal 30 mg once a day  . FLUoxetine (PROZAC) 20 MG tablet Take 1 tablet 20 mg along with 10 mg to equal 30 mg by mouth once a day  . Glycopyrrolate (LONHALA MAGNAIR STARTER KIT) 25 MCG/ML SOLN Inhale 25 mcg/mL into the lungs 2 (two) times daily.  . hydrochlorothiazide (HYDRODIURIL) 12.5 MG tablet Take 12.5 mg by mouth daily.  Marland Kitchen ipratropium-albuterol (DUONEB) 0.5-2.5 (3) MG/3ML SOLN Take 3 mLs by nebulization every 6 (six) hours as needed.  . levETIRAcetam (KEPPRA) 500 MG tablet TAKE 1 TABLET BY MOUTH TWICE A DAY AS DIRECTED.  Marland Kitchen lovastatin (MEVACOR) 20 MG tablet Take 1 tablet (20 mg total) by mouth at bedtime.  . Melatonin 5 MG TABS Take 5 mg by mouth at bedtime.  . Multiple Vitamin (MULTIVITAMIN) tablet Take 1 tablet by mouth daily.  . NON FORMULARY Diet type:  NAS  . polyethylene glycol (MIRALAX / GLYCOLAX) packet Take 17 g by mouth daily as needed.   . potassium chloride (K-DUR,KLOR-CON) 10 MEQ tablet Take 10 mEq by mouth daily.  Marland Kitchen senna (SENOKOT) 8.6 MG TABS tablet Take 2 tablets by mouth at bedtime.   . SYMBICORT 80-4.5 MCG/ACT inhaler Inhale 2 puffs into the lungs 2 (two) times daily.    No facility-administered encounter medications on file as of 03/30/2019.      SIGNIFICANT DIAGNOSTIC EXAMS  LABS REVIEWED PREVIOUS  02-25-18: chol 140; ldl 76; trig 70; hdl 50; 09-25-18" wbc  5.1; hgb 13.2; hct 42.0; mcv 94.2; plt 177; glucose 97; bun 21; creat 0.66; k+ 4.0; na++ 141 ca 8.9 01-02-19: wbc 5.3 hgb 12.7; hct 39.9; mcv 93.7; plt 162; glucose 98; bun 24; creat 0.72; k+ 4.2; na++142 ca 8.7 chol 145 ldl 87; tri 56; hdl 47   NO NEW LABS.    Review of Systems  Constitutional: Negative for malaise/fatigue.  Respiratory: Negative for cough and shortness of breath.   Cardiovascular: Negative for chest pain, palpitations and leg swelling.  Gastrointestinal: Negative for abdominal pain, constipation and heartburn.  Musculoskeletal: Negative for back  pain, joint pain and myalgias.  Skin: Negative.   Neurological: Negative for dizziness.  Psychiatric/Behavioral: The patient is not nervous/anxious.      Physical Exam Constitutional:      General: She is not in acute distress.    Appearance: She is  well-developed. She is obese. She is not diaphoretic.  Neck:     Musculoskeletal: Neck supple.     Thyroid: No thyromegaly.  Cardiovascular:     Rate and Rhythm: Normal rate and regular rhythm.     Pulses: Normal pulses.     Heart sounds: Normal heart sounds.  Pulmonary:     Effort: Pulmonary effort is normal. No respiratory distress.     Breath sounds: Normal breath sounds.  Abdominal:     General: Bowel sounds are normal. There is no distension.     Palpations: Abdomen is soft.     Tenderness: There is no abdominal tenderness.  Musculoskeletal:     Right lower leg: Edema present.     Left lower leg: Edema present.     Comments: Trace bilateral lower extremity edema Is able to move all extremities Uses wheelchair  2016: left hip fracture left hip replacement      Lymphadenopathy:     Cervical: No cervical adenopathy.  Skin:    General: Skin is warm and dry.  Neurological:     Mental Status: She is alert. Mental status is at baseline.  Psychiatric:        Mood and Affect: Mood normal.      ASSESSMENT/ PLAN:  TODAY:   1. Unspecified hyperlipidemia: is stable LDL 87; will continue mevacor 20 mg daily   2. Unspecified obsessive compulsive disorder: is stable will continue prozac 30 mg daily and melatonin 5 mg nightly   3. Chronic generalized pain is stable will continue tylenol 1300 mg daily   PREVIOUS  4. Seizure disorder: is stable no reports of seizure activity will continue keppra 500 mg twice daily   5. Chronic deep vein thrombosis (DVT) popliteal vein unspecified laterality: is stable will continue to monitor her status.   6. Essential hypertension: is stable b/p 134/76  will continue hctz 12.5 mg daily     7. Simple chronic bronchitis: is stable will continue symbicort 80/4.5 mcg 2 puffs twice daily glycopyrrolate 25 mcg neb twice daily and has duoneb every 6 hours as needed  8. Chronic constipation: is stable will continue colace 200 mg daily senna 2 tabs daily and miralax daily as needed   9. Hypokalemia: is stable k+ 4.2 will continue k+ 10 meq daily    MD is aware of resident's narcotic use and is in agreement with current plan of care. We will attempt to wean resident as apropriate   Ok Edwards NP [facility]  Contact [phone] Monday through Friday 8am- 5pm  After hours call [phone]

## 2019-04-29 ENCOUNTER — Non-Acute Institutional Stay (SKILLED_NURSING_FACILITY): Payer: Medicare Other | Admitting: Internal Medicine

## 2019-04-29 ENCOUNTER — Encounter: Payer: Self-pay | Admitting: Internal Medicine

## 2019-04-29 DIAGNOSIS — G40909 Epilepsy, unspecified, not intractable, without status epilepticus: Secondary | ICD-10-CM

## 2019-04-29 DIAGNOSIS — F418 Other specified anxiety disorders: Secondary | ICD-10-CM

## 2019-04-29 DIAGNOSIS — I10 Essential (primary) hypertension: Secondary | ICD-10-CM

## 2019-04-29 NOTE — Progress Notes (Signed)
Location:  [facility] Room Number: 145/D Place of Service:  SNF (31)  Hennie Duos, MD  Patient Care Team: Hennie Duos, MD as PCP - General (Internal Medicine) [name], MD (Gastroenterology) [name] [name], NP as Nurse Practitioner ([place]) [facility], [place] ([place])  Extended Emergency Contact Information Primary Emergency Contact: [facility] Address: [address]          [name], [place] [address] [name] of [place] Phone: [phone] Mobile Phone: [phone] Relation: Daughter Secondary Emergency Contact: [name] [place] of [place] Mobile Phone: [phone] Relation: Son    Allergies: Patient has no known allergies.  Chief Complaint  Patient presents with  . Medical Management of Chronic Issues    Routine visit of medical management    HPI: Patient is 85 y.o. female who is being seen for routine issues of hypertension, seizures, and depression.  Past Medical History:  Diagnosis Date  . Diverticulosis OCT 2010 TCS   SIGMOID COLON  . Esophageal web 2010   DILATION 16 MM  . Gastritis, Helicobacter pylori 9292   Rx. ABO for 10 days   . GERD (gastroesophageal reflux disease)   . Hip fracture, left ([place])    2016  . Hyperlipidemia   . Hypertension   . Restless legs syndrome   . Seizures [facility]) January 2010   Post head trauma in fall .sees [name]    Past Surgical History:  Procedure Laterality Date  . ABDOMINAL HYSTERECTOMY    . COLONOSCOPY  OCT 2010   TORTUOUS, Sml IH, Tics, MULTIPLE SIMPLE ADENOMAS (<6MM)  . HIP ARTHROPLASTY Left 11/19/2014   Procedure: LEFT PARTIAL HIP REPLACEMENT;  Surgeon: [name], MD;  Location: [facility];  Service: Orthopedics;  Laterality: Left;  . ORIF HIP FRACTURE  04/05/2012   Procedure: OPEN REDUCTION INTERNAL FIXATION HIP;  Surgeon: [name], MD;  Location: [facility];  Service: Orthopedics;   Laterality: Right;  . UPPER GASTROINTESTINAL ENDOSCOPY  AUG 2010   SAVARY  . VESICOVAGINAL FISTULA CLOSURE W/ TAH  1966   bleeding     Outpatient Encounter Medications as of 04/29/2019  Medication Sig  . acetaminophen (TYLENOL) 650 MG CR tablet Take 1,300 mg by mouth daily.   Marland Kitchen ammonium lactate (LAC-HYDRIN) 12 % lotion Apply to right heel once a day  . aspirin 81 MG chewable tablet Chew 81 mg by mouth daily.  . calcium-vitamin D (OSCAL WITH D) 500-200 MG-UNIT TABS TAKE (1) TABLET BY MOUTH (3) TIMES DAILY WITH MEALS.  Marland Kitchen docusate sodium (COLACE) 100 MG capsule Take 200 mg by mouth every evening.   Marland Kitchen FLUoxetine (PROZAC) 10 MG tablet Take 1 tablet 10 mg by mouth along with 20 mg to equal 30 mg once a day  . FLUoxetine (PROZAC) 20 MG tablet Take 1 tablet 20 mg along with 10 mg to equal 30 mg by mouth once a day  . Glycopyrrolate (LONHALA MAGNAIR STARTER KIT) 25 MCG/ML SOLN Inhale 25 mcg/mL into the lungs 2 (two) times daily.  . hydrochlorothiazide (HYDRODIURIL) 12.5 MG tablet Take 12.5 mg by mouth daily.  Marland Kitchen ipratropium-albuterol (DUONEB) 0.5-2.5 (3) MG/3ML SOLN Take 3 mLs by nebulization every 6 (six) hours as needed.  . levETIRAcetam (KEPPRA) 500 MG tablet TAKE 1 TABLET BY MOUTH TWICE A DAY AS DIRECTED.  Marland Kitchen lovastatin (MEVACOR) 20 MG tablet Take 1 tablet (20 mg total) by mouth at bedtime.  . Melatonin 5 MG TABS Take 5 mg  by mouth at bedtime.  . Multiple Vitamin (MULTIVITAMIN) tablet Take 1 tablet by mouth daily.  . NON FORMULARY Diet type:  NAS  . polyethylene glycol (MIRALAX / GLYCOLAX) packet Take 17 g by mouth daily as needed.   . potassium chloride (K-DUR,KLOR-CON) 10 MEQ tablet Take 10 mEq by mouth daily.  Marland Kitchen senna (SENOKOT) 8.6 MG TABS tablet Take 2 tablets by mouth at bedtime.   . SYMBICORT 80-4.5 MCG/ACT inhaler Inhale 2 puffs into the lungs 2 (two) times daily.    No facility-administered encounter medications on file as of 04/29/2019.      No orders of the defined types were placed  in this encounter.   Immunization History  Administered Date(s) Administered  . Influenza Split 07/25/2012  . Influenza,inj,Quad PF,6+ Mos 07/30/2013, 08/18/2014  . Influenza-Unspecified 07/06/2016, 07/12/2017, 07/11/2018  . PPD Test 12/06/2014  . Pneumococcal Conjugate-13 04/29/2014  . Pneumococcal Polysaccharide-23 07/25/2012  . Pneumococcal-Unspecified 08/01/2016  . Tdap 08/08/2011, 06/20/2017    Social History   Tobacco Use  . Smoking status: Never Smoker  . Smokeless tobacco: Never Used  Substance Use Topics  . Alcohol use: No    Review of Systems  DATA OBTAINED: from patient, nurse GENERAL:  no fevers, fatigue, appetite changes SKIN: No itching, rash HEENT: No complaint RESPIRATORY: No cough, wheezing, SOB CARDIAC: No chest pain, palpitations, lower extremity edema  GI: No abdominal pain, No N/V/D or constipation, No heartburn or reflux  GU: No dysuria, frequency or urgency, or incontinence  MUSCULOSKELETAL: No unrelieved bone/joint pain NEUROLOGIC: No headache, dizziness  PSYCHIATRIC: No overt anxiety or sadness  Vitals:   04/29/19 1222  BP: (!) 151/84  Pulse: 62  Resp: 18  Temp: 98.8 F (37.1 C)  SpO2: 95%   Body mass index is 38.31 kg/m. Physical Exam  GENERAL APPEARANCE: Alert, conversant, No acute distress  SKIN: No diaphoresis rash HEENT: Unremarkable RESPIRATORY: Breathing is even, unlabored. Lung sounds are clear   CARDIOVASCULAR: Heart irregular, 2/6 murmur, no rubs or gallops.  Trace peripheral edema GASTROINTESTINAL: Abdomen is soft, non-tender, not distended w/ normal bowel sounds.  GENITOURINARY: Bladder non tender, not distended  MUSCULOSKELETAL: No abnormal joints or musculature NEUROLOGIC: Cranial nerves 2-12 grossly intact. Moves all extremities PSYCHIATRIC: Mood and affect appropriate to situation, no behavioral issues  Patient Active Problem List   Diagnosis Date Noted  . Osteoporosis 12/31/2018  . Hypokalemia 11/24/2018  .  Chronic generalized pain 11/24/2018  . Chronic bronchitis ([place]) 10/14/2017  . Pain in joint, shoulder region 07/30/2013  . DVT of popliteal vein ([place]) 10/05/2012  . Carotid bruit 11/28/2011  . Obsessive-compulsive disorder 08/03/2010  . Chronic constipation 07/13/2009  . Seizure disorder ([place]) 06/11/2009  . Hyperlipemia 06/09/2009  . Depression with anxiety 06/09/2009  . Essential hypertension 06/09/2009    CMP     Component Value Date/Time   NA 142 01/02/2019 0700   K 4.2 01/02/2019 0700   CL 105 01/02/2019 0700   CO2 31 01/02/2019 0700   GLUCOSE 98 01/02/2019 0700   BUN 24 (H) 01/02/2019 0700   CREATININE 0.72 01/02/2019 0700   CREATININE 0.61 11/08/2014 1038   CALCIUM 8.7 (L) 01/02/2019 0700   PROT 7.7 02/25/2018 0707   ALBUMIN 4.2 02/25/2018 0707   AST 26 02/25/2018 0707   ALT 19 02/25/2018 0707   ALKPHOS 66 02/25/2018 0707   BILITOT 0.6 02/25/2018 0707   GFRNONAA >60 01/02/2019 0700   GFRAA >60 01/02/2019 0700   Recent Labs    07/11/18 0700 09/25/18  0750 01/02/19 0700  NA 143 141 142  K 3.7 4.0 4.2  CL 101 104 105  CO2 32 30 31  GLUCOSE 104* 97 98  BUN 16 21 24*  CREATININE 0.90 0.66 0.72  CALCIUM 9.2 8.9 8.7*   No results for input(s): AST, ALT, ALKPHOS, BILITOT, PROT, ALBUMIN in the last 8760 hours. Recent Labs    05/23/18 0728 07/11/18 0700 09/25/18 0750 01/02/19 0700  WBC 5.9 6.4 5.1 5.3  NEUTROABS 2.5  --  2.2 2.4  HGB 13.3 14.1 13.2 12.7  HCT 41.3 42.9 42.0 39.9  MCV 93.0 93.1 94.2 93.7  PLT 166 182 177 162   Recent Labs    01/02/19 0700  CHOL 145  LDLCALC 87  TRIG 56   No results found for: [facility] Lab Results  Component Value Date   TSH 1.590 06/19/2017   Lab Results  Component Value Date   HGBA1C 5.5 01/31/2018   Lab Results  Component Value Date   CHOL 145 01/02/2019   HDL 47 01/02/2019   LDLCALC 87 01/02/2019   TRIG 56 01/02/2019   CHOLHDL 3.1 01/02/2019    Significant Diagnostic Results in last 30 days:  No  results found.  Assessment and Plan  Essential hypertension Controlled on hydrochlorothiazide 12.5 mg daily; continue current regimen  Seizure disorder ([place]) No reported seizures; continue Keppra 500 mg twice daily  Depression with anxiety Appears controlled; continue fluoxetine 30 mg daily    Hennie Duos, MD

## 2019-05-01 ENCOUNTER — Encounter: Payer: Self-pay | Admitting: Internal Medicine

## 2019-05-02 NOTE — Assessment & Plan Note (Signed)
Controlled on hydrochlorothiazide 12.5 mg daily; continue current regimen

## 2019-05-02 NOTE — Assessment & Plan Note (Signed)
No reported seizures; continue Keppra 500 mg twice daily 

## 2019-05-02 NOTE — Assessment & Plan Note (Signed)
Appears controlled; continue fluoxetine 30 mg daily

## 2019-05-06 ENCOUNTER — Encounter: Payer: Self-pay | Admitting: Adult Health

## 2019-05-06 ENCOUNTER — Non-Acute Institutional Stay (SKILLED_NURSING_FACILITY): Payer: Medicare Other | Admitting: Adult Health

## 2019-05-06 DIAGNOSIS — R52 Pain, unspecified: Secondary | ICD-10-CM | POA: Diagnosis not present

## 2019-05-06 DIAGNOSIS — F418 Other specified anxiety disorders: Secondary | ICD-10-CM

## 2019-05-06 DIAGNOSIS — G8929 Other chronic pain: Secondary | ICD-10-CM

## 2019-05-06 DIAGNOSIS — G40909 Epilepsy, unspecified, not intractable, without status epilepticus: Secondary | ICD-10-CM | POA: Diagnosis not present

## 2019-05-06 NOTE — Progress Notes (Signed)
Location:   [facility] Room Number: 145 D Place of Service:  SNF (31)   CODE STATUS: Full Code  No Known Allergies  Chief Complaint  Patient presents with  . Acute Visit    Care Plan Meeting    HPI:  We have come together for her routine care plan meeting. BIMS 9/15 mood 4/30 ( poor sleep; appetite energy). Her weight has been stable. There are no reports of uncontrolled pain; she has had one fall without injury. Her appetite is good. She does come out of her room daily.   Past Medical History:  Diagnosis Date  . Diverticulosis OCT 2010 TCS   SIGMOID COLON  . Esophageal web 2010   DILATION 16 MM  . Gastritis, Helicobacter pylori 6712   Rx. ABO for 10 days   . GERD (gastroesophageal reflux disease)   . Hip fracture, left ([place])    2016  . Hyperlipidemia   . Hypertension   . Restless legs syndrome   . Seizures [facility]) January 2010   Post head trauma in fall .sees [name]    Past Surgical History:  Procedure Laterality Date  . ABDOMINAL HYSTERECTOMY    . COLONOSCOPY  OCT 2010   TORTUOUS, Sml IH, Tics, MULTIPLE SIMPLE ADENOMAS (<6MM)  . HIP ARTHROPLASTY Left 11/19/2014   Procedure: LEFT PARTIAL HIP REPLACEMENT;  Surgeon: [name], MD;  Location: [facility];  Service: Orthopedics;  Laterality: Left;  . ORIF HIP FRACTURE  04/05/2012   Procedure: OPEN REDUCTION INTERNAL FIXATION HIP;  Surgeon: [name], MD;  Location: [facility];  Service: Orthopedics;  Laterality: Right;  . UPPER GASTROINTESTINAL ENDOSCOPY  AUG 2010   SAVARY  . VESICOVAGINAL FISTULA CLOSURE W/ TAH  1966   bleeding     Social History   Socioeconomic History  . Marital status: Widowed    Spouse name: Not on file  . Number of children: Not on file  . Years of education: Not on file  . Highest education level: Not on file  Occupational History  . 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[employer]   Social Needs  . Financial resource strain: Not hard  at all  . Food insecurity    Worry: Never true    Inability: Never true  . Transportation needs    Medical: No    Non-medical: No  Tobacco Use  . Smoking status: Never Smoker  . Smokeless tobacco: Never Used  Substance and Sexual Activity  . Alcohol use: No  . Drug use: No  . Sexual activity: Never  Lifestyle  . Physical activity    Days per week: 4 days    Minutes per session: 20 min  . Stress: To some extent  Relationships  . Social Herbalist on phone: Twice a week    Gets together: Once a week    Attends religious service: Never    Active member of club or organization: No    Attends meetings of clubs or organizations: Never    Relationship status: Widowed  . Intimate partner violence    Fear of current or ex partner: No    Emotionally abused: No    Physically abused: No    Forced sexual activity: No  Other Topics Concern  . Not on file  Social History Narrative  . Not on file   Family History  Problem Relation Age of Onset  . Cancer Mother        bladder   .  Diabetes Mother   . Diabetes Father   . Emphysema Father   . GER disease Brother   . Colon cancer Neg Hx   . Colon polyps Neg Hx       VITAL SIGNS BP (!) 151/84   Pulse 62   Temp 98.2 F (36.8 C)   Resp 18   Ht '5\' 7"'$  (1.702 m)   Wt 244 lb 9.6 oz (110.9 kg)   BMI 38.31 kg/m   Outpatient Encounter Medications as of 05/06/2019  Medication Sig  . acetaminophen (TYLENOL) 650 MG CR tablet Take 1,300 mg by mouth daily.   Marland Kitchen ammonium lactate (LAC-HYDRIN) 12 % lotion Apply to right heel once a day  . aspirin 81 MG chewable tablet Chew 81 mg by mouth daily.  . calcium-vitamin D (OSCAL WITH D) 500-200 MG-UNIT TABS TAKE (1) TABLET BY MOUTH (3) TIMES DAILY WITH MEALS.  Marland Kitchen docusate sodium (COLACE) 100 MG capsule Take 200 mg by mouth every evening.   Marland Kitchen FLUoxetine (PROZAC) 10 MG tablet Take 1 tablet 10 mg by mouth along with 20 mg to equal 30 mg once a day  . FLUoxetine (PROZAC) 20 MG tablet Take  1 tablet 20 mg along with 10 mg to equal 30 mg by mouth once a day  . Glycopyrrolate (LONHALA MAGNAIR STARTER KIT) 25 MCG/ML SOLN Inhale 25 mcg/mL into the lungs 2 (two) times daily.  . hydrochlorothiazide (HYDRODIURIL) 12.5 MG tablet Take 12.5 mg by mouth daily.  Marland Kitchen ipratropium-albuterol (DUONEB) 0.5-2.5 (3) MG/3ML SOLN Take 3 mLs by nebulization every 6 (six) hours as needed.  . levETIRAcetam (KEPPRA) 500 MG tablet TAKE 1 TABLET BY MOUTH TWICE A DAY AS DIRECTED.  Marland Kitchen lovastatin (MEVACOR) 20 MG tablet Take 1 tablet (20 mg total) by mouth at bedtime.  . Melatonin 5 MG TABS Take 5 mg by mouth at bedtime.  . Multiple Vitamin (MULTIVITAMIN) tablet Take 1 tablet by mouth daily.  . NON FORMULARY Diet type:  NAS  . polyethylene glycol (MIRALAX / GLYCOLAX) packet Take 17 g by mouth daily as needed.   . potassium chloride (K-DUR,KLOR-CON) 10 MEQ tablet Take 10 mEq by mouth daily.  Marland Kitchen senna (SENOKOT) 8.6 MG TABS tablet Take 2 tablets by mouth at bedtime.   . SYMBICORT 80-4.5 MCG/ACT inhaler Inhale 2 puffs into the lungs 2 (two) times daily.    No facility-administered encounter medications on file as of 05/06/2019.      SIGNIFICANT DIAGNOSTIC EXAMS  LABS REVIEWED PREVIOUS  02-25-18: chol 140; ldl 76; trig 70; hdl 50; 09-25-18" wbc  5.1; hgb 13.2; hct 42.0; mcv 94.2; plt 177; glucose 97; bun 21; creat 0.66; k+ 4.0; na++ 141 ca 8.9 01-02-19: wbc 5.3 hgb 12.7; hct 39.9; mcv 93.7; plt 162; glucose 98; bun 24; creat 0.72; k+ 4.2; na++142 ca 8.7 chol 145 ldl 87; tri 56; hdl 47   NO NEW LABS.   Review of Systems  Constitutional: Negative for malaise/fatigue.  Respiratory: Negative for cough and shortness of breath.   Cardiovascular: Negative for chest pain, palpitations and leg swelling.  Gastrointestinal: Negative for abdominal pain, constipation and heartburn.  Musculoskeletal: Negative for back pain, joint pain and myalgias.  Skin: Negative.   Neurological: Negative for dizziness.   Psychiatric/Behavioral: The patient is not nervous/anxious.     Physical Exam Constitutional:      General: She is not in acute distress.    Appearance: She is well-developed. She is obese. She is not diaphoretic.  Neck:  Musculoskeletal: Neck supple.     Thyroid: No thyromegaly.  Cardiovascular:     Rate and Rhythm: Normal rate and regular rhythm.     Pulses: Normal pulses.     Heart sounds: Normal heart sounds.  Pulmonary:     Effort: Pulmonary effort is normal. No respiratory distress.     Breath sounds: Normal breath sounds.  Abdominal:     General: Bowel sounds are normal. There is no distension.     Palpations: Abdomen is soft.     Tenderness: There is no abdominal tenderness.  Musculoskeletal:     Right lower leg: Edema present.     Left lower leg: Edema present.     Comments: Trace bilateral lower extremity edema Is able to move all extremities Uses wheelchair  2016: left hip fracture left hip replacement       Lymphadenopathy:     Cervical: No cervical adenopathy.  Skin:    General: Skin is warm and dry.  Neurological:     Mental Status: She is alert. Mental status is at baseline.  Psychiatric:        Mood and Affect: Mood normal.        ASSESSMENT/ PLAN:  TODAY:   1. Seizure disorder 2. Depression with anxiety  3. Chronic generalized pain  Will continue current medications Will continue current plan of care Will continue to monitor her status.   MD is aware of resident's narcotic use and is in agreement with current plan of care. We will attempt to wean resident as apropriate   Ok Edwards NP [facility]  Contact [phone] Monday through Friday 8am- 5pm  After hours call [phone]

## 2019-05-28 ENCOUNTER — Encounter: Payer: Self-pay | Admitting: Adult Health

## 2019-05-28 ENCOUNTER — Non-Acute Institutional Stay (SKILLED_NURSING_FACILITY): Payer: Medicare Other | Admitting: Adult Health

## 2019-05-28 DIAGNOSIS — G40909 Epilepsy, unspecified, not intractable, without status epilepticus: Secondary | ICD-10-CM | POA: Diagnosis not present

## 2019-05-28 DIAGNOSIS — I82539 Chronic embolism and thrombosis of unspecified popliteal vein: Secondary | ICD-10-CM

## 2019-05-28 DIAGNOSIS — I10 Essential (primary) hypertension: Secondary | ICD-10-CM

## 2019-05-28 NOTE — Progress Notes (Signed)
Location:   [place] [place] [facility] Room Number: 145 D Place of Service:  SNF (31)   CODE STATUS: Full code  No Known Allergies  Chief Complaint  Patient presents with  . Medical Management of Chronic Issues         Seizure disorder: . Chronic deep vein thrombosis (DVT): popliteal vein unspecified laterality Essential hypertension:    HPI:  She is a 85 year old long term resident of this facility being seen for the management of her chronic illnesses; seizures; dvt; hypertension. She denies any uncontrolled pain; no worsening edema; no changes in her appetite; no anxiety or depressive thoughts.    Past Medical History:  Diagnosis Date  . Diverticulosis OCT 2010 TCS   SIGMOID COLON  . Esophageal web 2010   DILATION 16 MM  . Gastritis, Helicobacter pylori 4627   Rx. ABO for 10 days   . GERD (gastroesophageal reflux disease)   . Hip fracture, left ([place])    2016  . Hyperlipidemia   . Hypertension   . Restless legs syndrome   . Seizures [facility]) January 2010   Post head trauma in fall .sees [name]    Past Surgical History:  Procedure Laterality Date  . ABDOMINAL HYSTERECTOMY    . COLONOSCOPY  OCT 2010   TORTUOUS, Sml IH, Tics, MULTIPLE SIMPLE ADENOMAS (<6MM)  . HIP ARTHROPLASTY Left 11/19/2014   Procedure: LEFT PARTIAL HIP REPLACEMENT;  Surgeon: [name], MD;  Location: [facility];  Service: Orthopedics;  Laterality: Left;  . ORIF HIP FRACTURE  04/05/2012   Procedure: OPEN REDUCTION INTERNAL FIXATION HIP;  Surgeon: [name], MD;  Location: [facility];  Service: Orthopedics;  Laterality: Right;  . UPPER GASTROINTESTINAL ENDOSCOPY  AUG 2010   SAVARY  . VESICOVAGINAL FISTULA CLOSURE W/ TAH  1966   bleeding     Social History   Socioeconomic History  . Marital status: Widowed    Spouse name: Not on file  . Number of children: Not on file  . Years of education: Not on file  . Highest education level: Not on file  Occupational History  .  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[employer]   Social Needs  . Financial resource strain: Not hard at all  . Food insecurity    Worry: Never true    Inability: Never true  . Transportation needs    Medical: No    Non-medical: No  Tobacco Use  . Smoking status: Never Smoker  . Smokeless tobacco: Never Used  Substance and Sexual Activity  . Alcohol use: No  . Drug use: No  . Sexual activity: Never  Lifestyle  . Physical activity    Days per week: 4 days    Minutes per session: 20 min  . Stress: To some extent  Relationships  . Social Herbalist on phone: Twice a week    Gets together: Once a week    Attends religious service: Never    Active member of club or organization: No    Attends meetings of clubs or organizations: Never    Relationship status: Widowed  . Intimate partner violence    Fear of current or ex partner: No    Emotionally abused: No    Physically abused: No    Forced sexual activity: No  Other Topics Concern  . Not on file  Social History Narrative  . Not on file   Family History  Problem Relation Age of Onset  .  Cancer Mother        bladder   . Diabetes Mother   . Diabetes Father   . Emphysema Father   . GER disease Brother   . Colon cancer Neg Hx   . Colon polyps Neg Hx       VITAL SIGNS BP 137/80   Pulse 79   Temp 98 F (36.7 C)   Ht 5' 7" (1.702 m)   Wt 246 lb (111.6 kg)   BMI 38.53 kg/m   Outpatient Encounter Medications as of 05/28/2019  Medication Sig  . acetaminophen (TYLENOL) 650 MG CR tablet Take 1,300 mg by mouth daily.   . ammonium lactate (LAC-HYDRIN) 12 % lotion Apply to right heel once a day  . aspirin 81 MG chewable tablet Chew 81 mg by mouth daily.  . calcium-vitamin D (OSCAL WITH D) 500-200 MG-UNIT TABS TAKE (1) TABLET BY MOUTH (3) TIMES DAILY WITH MEALS.  . docusate sodium (COLACE) 100 MG capsule Take 200 mg by mouth every evening.   . FLUoxetine (PROZAC) 10 MG tablet Take 1 tablet 10 mg by  mouth along with 20 mg to equal 30 mg once a day  . FLUoxetine (PROZAC) 20 MG tablet Take 1 tablet 20 mg along with 10 mg to equal 30 mg by mouth once a day  . Glycopyrrolate (LONHALA MAGNAIR STARTER KIT) 25 MCG/ML SOLN Inhale 25 mcg/mL into the lungs 2 (two) times daily.  . hydrochlorothiazide (HYDRODIURIL) 12.5 MG tablet Take 12.5 mg by mouth daily.  . ipratropium-albuterol (DUONEB) 0.5-2.5 (3) MG/3ML SOLN Take 3 mLs by nebulization every 6 (six) hours as needed.  . levETIRAcetam (KEPPRA) 500 MG tablet TAKE 1 TABLET BY MOUTH TWICE A DAY AS DIRECTED.  . lovastatin (MEVACOR) 20 MG tablet Take 1 tablet (20 mg total) by mouth at bedtime.  . Melatonin 5 MG TABS Take 5 mg by mouth at bedtime.  . Multiple Vitamin (MULTIVITAMIN) tablet Take 1 tablet by mouth daily.  . NON FORMULARY Diet type:  NAS  . polyethylene glycol (MIRALAX / GLYCOLAX) packet Take 17 g by mouth daily as needed.   . potassium chloride (K-DUR,KLOR-CON) 10 MEQ tablet Take 10 mEq by mouth daily.  . senna (SENOKOT) 8.6 MG TABS tablet Take 2 tablets by mouth at bedtime.   . SYMBICORT 80-4.5 MCG/ACT inhaler Inhale 2 puffs into the lungs 2 (two) times daily.    No facility-administered encounter medications on file as of 05/28/2019.      SIGNIFICANT DIAGNOSTIC EXAMS  LABS REVIEWED PREVIOUS  09-25-18" wbc  5.1; hgb 13.2; hct 42.0; mcv 94.2; plt 177; glucose 97; bun 21; creat 0.66; k+ 4.0; na++ 141 ca 8.9 01-02-19: wbc 5.3 hgb 12.7; hct 39.9; mcv 93.7; plt 162; glucose 98; bun 24; creat 0.72; k+ 4.2; na++142 ca 8.7 chol 145 ldl 87; tri 56; hdl 47   NO NEW LABS.    Review of Systems  Constitutional: Negative for malaise/fatigue.  Respiratory: Negative for cough and shortness of breath.   Cardiovascular: Negative for chest pain, palpitations and leg swelling.  Gastrointestinal: Negative for abdominal pain, constipation and heartburn.  Musculoskeletal: Negative for back pain, joint pain and myalgias.  Skin: Negative.    Neurological: Negative for dizziness.  Psychiatric/Behavioral: The patient is not nervous/anxious.     Physical Exam Constitutional:      General: She is not in acute distress.    Appearance: She is well-developed. She is obese. She is not diaphoretic.  Neck:     Musculoskeletal: Neck   supple.     Thyroid: No thyromegaly.  Cardiovascular:     Rate and Rhythm: Normal rate and regular rhythm.     Pulses: Normal pulses.     Heart sounds: Normal heart sounds.  Pulmonary:     Effort: Pulmonary effort is normal. No respiratory distress.     Breath sounds: Normal breath sounds.  Abdominal:     General: Bowel sounds are normal. There is no distension.     Palpations: Abdomen is soft.     Tenderness: There is no abdominal tenderness.  Musculoskeletal:     Right lower leg: Edema present.     Left lower leg: Edema present.     Comments: Trace bilateral lower extremity edema Is able to move all extremities Uses wheelchair  2016: left hip fracture left hip replacement        Lymphadenopathy:     Cervical: No cervical adenopathy.  Skin:    General: Skin is warm and dry.  Neurological:     Mental Status: She is alert. Mental status is at baseline.  Psychiatric:        Mood and Affect: Mood normal.      ASSESSMENT/ PLAN:  TODAY:   1. Seizure disorder: is stable no reports of seizures; will continue keppra 500 mg twice daily   2. Chronic deep vein thrombosis (DVT): popliteal vein unspecified laterality is stable will monitor her status.   3. Essential hypertension: is stable: is stable b/p 137/80 will continue hctz 12.5 mg daily    PREVIOUS    4. Simple chronic bronchitis: is stable will continue symbicort 80/4.5 mcg 2 puffs twice daily glycopyrrolate 25 mcg neb twice daily and has duoneb every 6 hours as needed  5. Chronic constipation: is stable will continue colace 200 mg daily senna 2 tabs daily and miralax daily as needed   6. Hypokalemia: is stable k+ 4.2 will continue  k+ 10 meq daily   7. Unspecified hyperlipidemia: is stable LDL 87; will continue mevacor 20 mg daily   8. Unspecified obsessive compulsive disorder: is stable will continue prozac 30 mg daily and melatonin 5 mg nightly   9. Chronic generalized pain is stable will continue tylenol 1300 mg daily    MD is aware of resident's narcotic use and is in agreement with current plan of care. We will attempt to wean resident as apropriate     NP [facility]  Contact [phone] Monday through Friday 8am- 5pm  After hours call [phone]  

## 2019-06-11 ENCOUNTER — Non-Acute Institutional Stay (SKILLED_NURSING_FACILITY): Payer: Medicare Other | Admitting: Adult Health

## 2019-06-11 DIAGNOSIS — F418 Other specified anxiety disorders: Secondary | ICD-10-CM

## 2019-06-11 DIAGNOSIS — F429 Obsessive-compulsive disorder, unspecified: Secondary | ICD-10-CM

## 2019-06-15 NOTE — Progress Notes (Signed)
Location:   [facility] Room Number: 140 Place of Service:  SNF (31)   CODE STATUS: full code   No Known Allergies  Chief Complaint  Patient presents with   Acute Visit    psychoactive medications     HPI:  We have come together for her psychoactive medication review. She has depression; and ocd.staff report that she has a history of spontaneous verbal bursts. She has not been doing this recently. She is presently taking prozac 30 mg daily . Her mood state is stable. She will take wash cloths off the linen cart. She appetite is good; without significant change in weight. She denies any anxious or depressive thoughts. There are no reports of agitation present.    Past Medical History:  Diagnosis Date   Diverticulosis OCT 2010 TCS   SIGMOID COLON   Esophageal web 2010   DILATION 16 MM   Gastritis, Helicobacter pylori 9562   Rx. ABO for 10 days    GERD (gastroesophageal reflux disease)    Hip fracture, left ([place])    2016   Hyperlipidemia    Hypertension    Restless legs syndrome    Seizures ([place]) January 2010   Post head trauma in fall .sees [name]    Past Surgical History:  Procedure Laterality Date   ABDOMINAL HYSTERECTOMY     COLONOSCOPY  OCT 2010   TORTUOUS, Sml IH, Tics, MULTIPLE SIMPLE ADENOMAS (<6MM)   HIP ARTHROPLASTY Left 11/19/2014   Procedure: LEFT PARTIAL HIP REPLACEMENT;  Surgeon: [name], MD;  Location: [facility];  Service: Orthopedics;  Laterality: Left;   ORIF HIP FRACTURE  04/05/2012   Procedure: OPEN REDUCTION INTERNAL FIXATION HIP;  Surgeon: [name], MD;  Location: [facility];  Service: Orthopedics;  Laterality: Right;   UPPER GASTROINTESTINAL ENDOSCOPY  AUG 2010   SAVARY   VESICOVAGINAL FISTULA CLOSURE W/ TAH  1966   bleeding     Social History   Socioeconomic History   Marital status: Widowed    Spouse name: Not on file   Number of children: Not on file   Years of education: Not on file    Highest education level: Not on file  Occupational History   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   Food insecurity    Worry: Never true    Inability: Never true   Transportation needs    Medical: No    Non-medical: No  Tobacco Use   Smoking status: Never Smoker   Smokeless tobacco: Never Used  Substance and Sexual Activity   Alcohol use: No   Drug use: No   Sexual activity: Never  Lifestyle   Physical activity    Days per week: 4 days    Minutes per session: 20 min   Stress: To some extent  Relationships   Social connections    Talks on phone: Twice a week    Gets together: Once a week    Attends religious service: Never    Active member of club or organization: No    Attends meetings of clubs or organizations: Never    Relationship status: Widowed   Intimate partner violence    Fear of current or ex partner: No    Emotionally abused: No    Physically abused: No    Forced sexual activity: No  Other Topics Concern   Not on file  Social History Narrative  Not on file   Family History  Problem Relation Age of Onset   Cancer Mother        bladder    Diabetes Mother    Diabetes Father    Emphysema Father    GER disease Brother    Colon cancer Neg Hx    Colon polyps Neg Hx       VITAL SIGNS BP 132/80    Pulse 78    Temp 97.6 F (36.4 C)    Ht _0  (1.702 m)    Wt 246 lb (111.6 kg)    BMI 38.53 kg/m   Outpatient Encounter Medications as of 06/11/2019  Medication Sig   acetaminophen (TYLENOL) 650 MG CR tablet Take 1,300 mg by mouth daily.    ammonium lactate (LAC-HYDRIN) 12 % lotion Apply to right heel once a day   aspirin 81 MG chewable tablet Chew 81 mg by mouth daily.   calcium-vitamin D (OSCAL WITH D) 500-200 MG-UNIT TABS TAKE (1) TABLET BY MOUTH (3) TIMES DAILY WITH MEALS.   docusate sodium (COLACE) 100 MG capsule Take 200 mg by mouth every  evening.    FLUoxetine (PROZAC) 10 MG tablet Take 1 tablet 10 mg by mouth along with 20 mg to equal 30 mg once a day   FLUoxetine (PROZAC) 20 MG tablet Take 1 tablet 20 mg along with 10 mg to equal 30 mg by mouth once a day   Glycopyrrolate (LONHALA MAGNAIR STARTER KIT) 25 MCG/ML SOLN Inhale 25 mcg/mL into the lungs 2 (two) times daily.   hydrochlorothiazide (HYDRODIURIL) 12.5 MG tablet Take 12.5 mg by mouth daily.   ipratropium-albuterol (DUONEB) 0.5-2.5 (3) MG/3ML SOLN Take 3 mLs by nebulization every 6 (six) hours as needed.   levETIRAcetam (KEPPRA) 500 MG tablet TAKE 1 TABLET BY MOUTH TWICE A DAY AS DIRECTED.   lovastatin (MEVACOR) 20 MG tablet Take 1 tablet (20 mg total) by mouth at bedtime.   Melatonin 5 MG TABS Take 5 mg by mouth at bedtime.   Multiple Vitamin (MULTIVITAMIN) tablet Take 1 tablet by mouth daily.   NON FORMULARY Diet type:  NAS   polyethylene glycol (MIRALAX / GLYCOLAX) packet Take 17 g by mouth daily as needed.    potassium chloride (K-DUR,KLOR-CON) 10 MEQ tablet Take 10 mEq by mouth daily.   senna (SENOKOT) 8.6 MG TABS tablet Take 2 tablets by mouth at bedtime.    SYMBICORT 80-4.5 MCG/ACT inhaler Inhale 2 puffs into the lungs 2 (two) times daily.    No facility-administered encounter medications on file as of 06/11/2019.      SIGNIFICANT DIAGNOSTIC EXAMS   LABS REVIEWED PREVIOUS  09-25-18" wbc  5.1; hgb 13.2; hct 42.0; mcv 94.2; plt 177; glucose 97; bun 21; creat 0.66; k+ 4.0; na++ 141 ca 8.9 01-02-19: wbc 5.3 hgb 12.7; hct 39.9; mcv 93.7; plt 162; glucose 98; bun 24; creat 0.72; k+ 4.2; na++142 ca 8.7 chol 145 ldl 87; tri 56; hdl 47   NO NEW LABS.    Review of Systems  Constitutional: Negative for malaise/fatigue.  Respiratory: Negative for cough and shortness of breath.   Cardiovascular: Negative for chest pain, palpitations and leg swelling.  Gastrointestinal: Negative for abdominal pain, constipation and heartburn.  Musculoskeletal: Negative  for back pain, joint pain and myalgias.  Skin: Negative.   Neurological: Negative for dizziness.  Psychiatric/Behavioral: The patient is not nervous/anxious.    Physical Exam Constitutional:      General: She is not in acute distress.  Appearance: She is well-developed. She is obese. She is not diaphoretic.  Neck:     Musculoskeletal: Neck supple.     Thyroid: No thyromegaly.  Cardiovascular:     Rate and Rhythm: Normal rate and regular rhythm.     Pulses: Normal pulses.     Heart sounds: Normal heart sounds.  Pulmonary:     Effort: Pulmonary effort is normal. No respiratory distress.     Breath sounds: Normal breath sounds.  Abdominal:     General: Bowel sounds are normal. There is no distension.     Palpations: Abdomen is soft.     Tenderness: There is no abdominal tenderness.  Musculoskeletal:     Right lower leg: Edema present.     Left lower leg: Edema present.     Comments: Trace bilateral lower extremity edema Is able to move all extremities Uses wheelchair  2016: left hip fracture left hip replacement        Lymphadenopathy:     Cervical: No cervical adenopathy.  Skin:    General: Skin is warm and dry.  Neurological:     Mental Status: She is alert. Mental status is at baseline.  Psychiatric:        Mood and Affect: Mood normal.      ASSESSMENT/ PLAN:  TODAY  1. Obsessive compulsive disorder 2. Depression with anxiety:   She is presently stable; to lower her medication could cause her great emotional harm We will continue to monitor her status Will make changes in the further as indicated.   MD is aware of resident's narcotic use and is in agreement with current plan of care. We will attempt to wean resident as appropriate.  Ok Edwards NP [facility]  Contact [phone] Monday through Friday 8am- 5pm  After hours call [phone]

## 2019-07-02 ENCOUNTER — Encounter: Payer: Self-pay | Admitting: Gastroenterology

## 2019-07-08 ENCOUNTER — Encounter (HOSPITAL_COMMUNITY)
Admission: RE | Admit: 2019-07-08 | Discharge: 2019-07-08 | Disposition: A | Discharge status: Home / Self Care | Payer: Medicare Other | Source: Skilled Nursing Facility | Attending: Internal Medicine | Admitting: Internal Medicine

## 2019-07-08 ENCOUNTER — Encounter: Payer: Self-pay | Admitting: Adult Health

## 2019-07-08 ENCOUNTER — Non-Acute Institutional Stay (SKILLED_NURSING_FACILITY): Payer: Medicare Other | Admitting: Adult Health

## 2019-07-08 DIAGNOSIS — B342 Coronavirus infection, unspecified: Secondary | ICD-10-CM

## 2019-07-08 DIAGNOSIS — U071 COVID-19: Secondary | ICD-10-CM | POA: Diagnosis not present

## 2019-07-08 DIAGNOSIS — J410 Simple chronic bronchitis: Secondary | ICD-10-CM

## 2019-07-08 DIAGNOSIS — E876 Hypokalemia: Secondary | ICD-10-CM

## 2019-07-08 DIAGNOSIS — K5909 Other constipation: Secondary | ICD-10-CM | POA: Diagnosis not present

## 2019-07-08 LAB — SARS CORONAVIRUS 2 (TAT 6-24 HRS): SARS Coronavirus 2: POSITIVE — AB

## 2019-07-08 NOTE — Progress Notes (Signed)
Location:    [place] Room Number: 145/D Place of Service:  SNF (31)   CODE STATUS: Full Code  No Known Allergies  Chief Complaint  Patient presents with  . Medical Management of Chronic Issues          Simple chronic bronchitis:  Chronic constipation: Hypokalemia:    HPI:  Alejandra Cruz is a 85 year old long term resident of this facility being seen for the management of her chronic illnesses: bronchitis; constipation; hypokalemia. Alejandra Cruz has tested positive for covid. Alejandra Cruz does not have any symptoms including cough; sinus congestion; no change in smell or taste; no reports of fevers. Alejandra Cruz has been placed in isolation.   Past Medical History:  Diagnosis Date  . Diverticulosis OCT 2010 TCS   SIGMOID COLON  . Esophageal web 2010   DILATION 16 MM  . Gastritis, Helicobacter pylori 6808   Rx. ABO for 10 days   . GERD (gastroesophageal reflux disease)   . Hip fracture, left ([place])    2016  . Hyperlipidemia   . Hypertension   . Restless legs syndrome   . Seizures [facility]) January 2010   Post head trauma in fall .sees [name]    Past Surgical History:  Procedure Laterality Date  . ABDOMINAL HYSTERECTOMY    . COLONOSCOPY  OCT 2010   TORTUOUS, Sml IH, Tics, MULTIPLE SIMPLE ADENOMAS (<6MM)  . HIP ARTHROPLASTY Left 11/19/2014   Procedure: LEFT PARTIAL HIP REPLACEMENT;  Surgeon: [name], MD;  Location: [facility];  Service: Orthopedics;  Laterality: Left;  . ORIF HIP FRACTURE  04/05/2012   Procedure: OPEN REDUCTION INTERNAL FIXATION HIP;  Surgeon: [name], MD;  Location: [facility];  Service: Orthopedics;  Laterality: Right;  . UPPER GASTROINTESTINAL ENDOSCOPY  AUG 2010   SAVARY  . VESICOVAGINAL FISTULA CLOSURE W/ TAH  1966   bleeding     Social History   Socioeconomic History  . Marital status: Widowed    Spouse name: Not on file  . Number of children: Not on file  . Years of education: Not on file  . Highest education level: Not on file   Occupational History  . 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[employer]   Social Needs  . Financial resource strain: Not hard at all  . Food insecurity    Worry: Never true    Inability: Never true  . Transportation needs    Medical: No    Non-medical: No  Tobacco Use  . Smoking status: Never Smoker  . Smokeless tobacco: Never Used  Substance and Sexual Activity  . Alcohol use: No  . Drug use: No  . Sexual activity: Never  Lifestyle  . Physical activity    Days per week: 4 days    Minutes per session: 20 min  . Stress: To some extent  Relationships  . Social Herbalist on phone: Twice a week    Gets together: Once a week    Attends religious service: Never    Active member of club or organization: No    Attends meetings of clubs or organizations: Never    Relationship status: Widowed  . Intimate partner violence    Fear of current or ex partner: No    Emotionally abused: No    Physically abused: No    Forced sexual activity: No  Other Topics Concern  . Not on file  Social History Narrative  . Not on file   Family History  Problem Relation Age of Onset  . Cancer Mother        bladder   . Diabetes Mother   . Diabetes Father   . Emphysema Father   . GER disease Brother   . Colon cancer Neg Hx   . Colon polyps Neg Hx       VITAL SIGNS BP (!) 157/82   Pulse 60   Temp (!) 97.4 F (36.3 C) (Oral)   Resp 16   Ht '5\' 7"'$  (1.702 m)   Wt 247 lb 6.4 oz (112.2 kg)   SpO2 95%   BMI 38.75 kg/m   Outpatient Encounter Medications as of 07/08/2019  Medication Sig  . acetaminophen (TYLENOL) 650 MG CR tablet Take 1,300 mg by mouth daily.   Marland Kitchen ammonium lactate (LAC-HYDRIN) 12 % lotion Apply to right heel once a day  . Ascorbic Acid (VITAMIN C) 1000 MG tablet Take 1,000 mg by mouth daily.  Marland Kitchen aspirin 81 MG chewable tablet Chew 81 mg by mouth daily.  . calcium-vitamin D (OSCAL WITH D) 500-200 MG-UNIT TABS TAKE (1) TABLET BY MOUTH (3) TIMES DAILY WITH  MEALS.  Marland Kitchen docusate sodium (COLACE) 100 MG capsule Take 200 mg by mouth every evening.   . ergocalciferol (VITAMIN D2) 1.25 MG (50000 UT) capsule Take 50,000 Units by mouth once a week.  Marland Kitchen FLUoxetine (PROZAC) 10 MG tablet Take 1 tablet 10 mg by mouth along with 20 mg to equal 30 mg once a day  . FLUoxetine (PROZAC) 20 MG tablet Take 1 tablet 20 mg along with 10 mg to equal 30 mg by mouth once a day  . Glycopyrrolate (LONHALA MAGNAIR STARTER KIT) 25 MCG/ML SOLN Inhale 25 mcg/mL into the lungs 2 (two) times daily.  . hydrochlorothiazide (HYDRODIURIL) 12.5 MG tablet Take 12.5 mg by mouth daily.  Marland Kitchen ipratropium-albuterol (DUONEB) 0.5-2.5 (3) MG/3ML SOLN Take 3 mLs by nebulization every 6 (six) hours as needed.  . levETIRAcetam (KEPPRA) 500 MG tablet TAKE 1 TABLET BY MOUTH TWICE A DAY AS DIRECTED.  Marland Kitchen lovastatin (MEVACOR) 20 MG tablet Take 1 tablet (20 mg total) by mouth at bedtime.  . Melatonin 5 MG TABS Take 5 mg by mouth at bedtime.  . Multiple Vitamin (MULTIVITAMIN) tablet Take 1 tablet by mouth daily.  . NON FORMULARY Diet type:  NAS  . polyethylene glycol (MIRALAX / GLYCOLAX) packet Take 17 g by mouth daily as needed.   . potassium chloride (K-DUR,KLOR-CON) 10 MEQ tablet Take 10 mEq by mouth daily.  Marland Kitchen senna (SENOKOT) 8.6 MG TABS tablet Take 2 tablets by mouth at bedtime.   . SYMBICORT 80-4.5 MCG/ACT inhaler Inhale 2 puffs into the lungs 2 (two) times daily.   . Zinc 50 MG TABS Take 50 mg by mouth daily.   No facility-administered encounter medications on file as of 07/08/2019.      SIGNIFICANT DIAGNOSTIC EXAMS    LABS REVIEWED PREVIOUS  09-25-18" wbc  5.1; hgb 13.2; hct 42.0; mcv 94.2; plt 177; glucose 97; bun 21; creat 0.66; k+ 4.0; na++ 141 ca 8.9 01-02-19: wbc 5.3 hgb 12.7; hct 39.9; mcv 93.7; plt 162; glucose 98; bun 24; creat 0.72; k+ 4.2; na++142 ca 8.7 chol 145 ldl 87; tri 56; hdl 47   NO NEW LABS.   Review of Systems  Constitutional: Negative for malaise/fatigue.  Respiratory:  Negative for cough and shortness of breath.   Cardiovascular: Negative for chest pain, palpitations and leg swelling.  Gastrointestinal: Negative for abdominal pain, constipation and heartburn.  Musculoskeletal: Negative for back pain, joint pain and myalgias.  Skin: Negative.   Neurological: Negative for dizziness.  Psychiatric/Behavioral: The patient is not nervous/anxious.      Physical Exam Constitutional:      General: Alejandra Cruz is not in acute distress.    Appearance: Alejandra Cruz is well-developed. Alejandra Cruz is obese. Alejandra Cruz is not diaphoretic.  Neck:     Musculoskeletal: Neck supple.     Thyroid: No thyromegaly.  Cardiovascular:     Rate and Rhythm: Normal rate and regular rhythm.     Pulses: Normal pulses.     Heart sounds: Normal heart sounds.  Pulmonary:     Effort: Pulmonary effort is normal. No respiratory distress.     Breath sounds: Normal breath sounds.  Abdominal:     General: Bowel sounds are normal. There is no distension.     Palpations: Abdomen is soft.     Tenderness: There is no abdominal tenderness.  Musculoskeletal:     Right lower leg: Edema present.     Left lower leg: Edema present.     Comments: Trace bilateral lower extremity edema Is able to move all extremities Uses wheelchair  2016: left hip fracture left hip replacement     Lymphadenopathy:     Cervical: No cervical adenopathy.  Skin:    General: Skin is warm and dry.  Neurological:     Mental Status: Alejandra Cruz is alert. Mental status is at baseline.  Psychiatric:        Mood and Affect: Mood normal.      ASSESSMENT/ PLAN:  TODAY:   1. Simple chronic bronchitis: is stable will continue symbicort 80/4.5 mcg 2 puffs twice daily glycopyrrolate 25 mcg neb twice daly has duoneb every 6 hours as needed  2. Chronic constipation: is stable colace 200 mg daily senna  2 tabs daily mialax daily as needed;   3. Hypokalemia: is stable k+ 4.2 will continue k+ 10 meq daily   4. Coronavirus infection: without change: will  begin asa 81 mg daily; vit D 50,000 units weekly vit C 100 mg  zinc 50 mg daily for 2 weeks.    PREVIOUS    5. Unspecified hyperlipidemia: is stable LDL 87; will continue mevacor 20 mg daily   6. Unspecified obsessive compulsive disorder: is stable will continue prozac 30 mg daily and melatonin 5 mg nightly   7. Chronic generalized pain is stable will continue tylenol 1300 mg daily   8. Seizure disorder: is stable no reports of seizures; will continue keppra 500 mg twice daily   9. Chronic deep vein thrombosis (DVT): popliteal vein unspecified laterality is stable will monitor her status.   10. Essential hypertension: is stable: is stable b/p 157/82 will continue hctz 12.5 mg daily     MD is aware of resident's narcotic use and is in agreement with current plan of care. We will attempt to wean resident as appropriate.  Ok Edwards NP [facility]  Contact [phone] Monday through Friday 8am- 5pm  After hours call [phone]

## 2019-07-12 DIAGNOSIS — B342 Coronavirus infection, unspecified: Secondary | ICD-10-CM | POA: Insufficient documentation

## 2019-07-13 ENCOUNTER — Encounter (HOSPITAL_COMMUNITY)
Admission: RE | Admit: 2019-07-13 | Discharge: 2019-07-13 | Disposition: A | Discharge status: Home / Self Care | Payer: Medicare Other | Source: Skilled Nursing Facility | Attending: Adult Health | Admitting: Adult Health

## 2019-07-13 DIAGNOSIS — I10 Essential (primary) hypertension: Secondary | ICD-10-CM | POA: Diagnosis not present

## 2019-07-13 DIAGNOSIS — E785 Hyperlipidemia, unspecified: Secondary | ICD-10-CM | POA: Insufficient documentation

## 2019-07-13 LAB — LIPID PANEL
Cholesterol: 126 mg/dL (ref 0–200)
HDL: 37 mg/dL — ABNORMAL LOW (ref >40)
LDL Cholesterol: 81 mg/dL (ref 0–99)
Total CHOL/HDL Ratio: 3.4 RATIO
Triglycerides: 41 mg/dL (ref <150)
VLDL: 8 mg/dL (ref 0–40)

## 2019-07-13 LAB — COMPREHENSIVE METABOLIC PANEL
ALT: 17 U/L (ref 0–44)
AST: 27 U/L (ref 15–41)
Albumin: 3.5 g/dL (ref 3.5–5.0)
Alkaline Phosphatase: 37 U/L — ABNORMAL LOW (ref 38–126)
Anion gap: 12 (ref 5–15)
BUN: 15 mg/dL (ref 8–23)
CO2: 27 mmol/L (ref 22–32)
Calcium: 8.1 mg/dL — ABNORMAL LOW (ref 8.9–10.3)
Chloride: 93 mmol/L — ABNORMAL LOW (ref 98–111)
Creatinine, Ser: 0.66 mg/dL (ref 0.44–1.00)
GFR calc Af Amer: >60 mL/min (ref >60)
GFR calc non Af Amer: >60 mL/min (ref >60)
Glucose, Bld: 99 mg/dL (ref 70–99)
Potassium: 3.5 mmol/L (ref 3.5–5.1)
Sodium: 132 mmol/L — ABNORMAL LOW (ref 135–145)
Total Bilirubin: 0.5 mg/dL (ref 0.3–1.2)
Total Protein: 6.7 g/dL (ref 6.5–8.1)

## 2019-07-13 LAB — CBC
HCT: 38.6 % (ref 36.0–46.0)
Hemoglobin: 12.5 g/dL (ref 12.0–15.0)
MCH: 29.9 pg (ref 26.0–34.0)
MCHC: 32.4 g/dL (ref 30.0–36.0)
MCV: 92.3 fL (ref 80.0–100.0)
Platelets: 117 10*3/uL — ABNORMAL LOW (ref 150–400)
RBC: 4.18 MIL/uL (ref 3.87–5.11)
RDW: 13.7 % (ref 11.5–15.5)
WBC: 3 10*3/uL — ABNORMAL LOW (ref 4.0–10.5)
nRBC: 0 % (ref 0.0–0.2)

## 2019-07-15 ENCOUNTER — Other Ambulatory Visit: Payer: Self-pay

## 2019-07-29 ENCOUNTER — Other Ambulatory Visit: Payer: Self-pay | Admitting: Adult Health

## 2019-08-03 ENCOUNTER — Encounter: Payer: Self-pay | Admitting: Adult Health

## 2019-08-03 ENCOUNTER — Non-Acute Institutional Stay (SKILLED_NURSING_FACILITY): Payer: Medicare Other | Admitting: Adult Health

## 2019-08-03 DIAGNOSIS — F418 Other specified anxiety disorders: Secondary | ICD-10-CM | POA: Diagnosis not present

## 2019-08-03 NOTE — Progress Notes (Signed)
Location:    [place] Room Number: 145/D Place of Service:  SNF (31)   CODE STATUS: Full Code  No Known Allergies  Chief Complaint  Patient presents with  . Acute Visit    Worsening Depression    HPI:  Nursing staff is concerned about her depression. She is spending nearly all of her time in bed. She normally sits outside her room and engages with others. Her appetite remains good. She tells me that she is sleeping at night. She does feel sad and lonely. She denies any suicidal thoughts. She is unable to take wellbutrin due to her history of seizures. She is not having any hallucinations or delusional thought patterns.    Past Medical History:  Diagnosis Date  . Diverticulosis OCT 2010 TCS   SIGMOID COLON  . Esophageal web 2010   DILATION 16 MM  . Gastritis, Helicobacter pylori 9811   Rx. ABO for 10 days   . GERD (gastroesophageal reflux disease)   . Hip fracture, left ([place])    2016  . Hyperlipidemia   . Hypertension   . Restless legs syndrome   . Seizures [facility]) January 2010   Post head trauma in fall .sees [name]    Past Surgical History:  Procedure Laterality Date  . ABDOMINAL HYSTERECTOMY    . COLONOSCOPY  OCT 2010   TORTUOUS, Sml IH, Tics, MULTIPLE SIMPLE ADENOMAS (<6MM)  . HIP ARTHROPLASTY Left 11/19/2014   Procedure: LEFT PARTIAL HIP REPLACEMENT;  Surgeon: [name], MD;  Location: [facility];  Service: Orthopedics;  Laterality: Left;  . ORIF HIP FRACTURE  04/05/2012   Procedure: OPEN REDUCTION INTERNAL FIXATION HIP;  Surgeon: [name], MD;  Location: [facility];  Service: Orthopedics;  Laterality: Right;  . UPPER GASTROINTESTINAL ENDOSCOPY  AUG 2010   SAVARY  . VESICOVAGINAL FISTULA CLOSURE W/ TAH  1966   bleeding     Social History   Socioeconomic History  . Marital status: Widowed    Spouse name: Not on file  . Number of children: Not on file  . Years of education: Not on file  . Highest education level: Not  on file  Occupational History  . 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[employer]   Social Needs  . Financial resource strain: Not hard at all  . Food insecurity    Worry: Never true    Inability: Never true  . Transportation needs    Medical: No    Non-medical: No  Tobacco Use  . Smoking status: Never Smoker  . Smokeless tobacco: Never Used  Substance and Sexual Activity  . Alcohol use: No  . Drug use: No  . Sexual activity: Never  Lifestyle  . Physical activity    Days per week: 4 days    Minutes per session: 20 min  . Stress: To some extent  Relationships  . Social Herbalist on phone: Twice a week    Gets together: Once a week    Attends religious service: Never    Active member of club or organization: No    Attends meetings of clubs or organizations: Never    Relationship status: Widowed  . Intimate partner violence    Fear of current or ex partner: No    Emotionally abused: No    Physically abused: No    Forced sexual activity: No  Other Topics Concern  . Not on file  Social History Narrative  . Not on file  Family History  Problem Relation Age of Onset  . Cancer Mother        bladder   . Diabetes Mother   . Diabetes Father   . Emphysema Father   . GER disease Brother   . Colon cancer Neg Hx   . Colon polyps Neg Hx       VITAL SIGNS BP (!) 141/79   Pulse 67   Temp 98.1 F (36.7 C) (Oral)   Resp 20   Ht 5' 7" (1.702 m)   Wt 237 lb 9.6 oz (107.8 kg)   SpO2 95%   BMI 37.21 kg/m   Outpatient Encounter Medications as of 08/03/2019  Medication Sig  . acetaminophen (TYLENOL) 650 MG CR tablet Take 1,300 mg by mouth daily.   Marland Kitchen aspirin 81 MG chewable tablet Chew 81 mg by mouth daily.  . calcium-vitamin D (OSCAL WITH D) 500-200 MG-UNIT TABS TAKE (1) TABLET BY MOUTH (3) TIMES DAILY WITH MEALS.  Marland Kitchen docusate sodium (COLACE) 100 MG capsule Take 200 mg by mouth every evening.   Marland Kitchen FLUoxetine (PROZAC) 10 MG tablet Take 1 tablet 10 mg by  mouth along with 20 mg to equal 30 mg once a day  . FLUoxetine (PROZAC) 20 MG tablet Take 1 tablet 20 mg along with 10 mg to equal 30 mg by mouth once a day  . Glucerna (GLUCERNA) LIQD Take 237 mLs by mouth 2 (two) times daily between meals.  . Glycopyrrolate (LONHALA MAGNAIR STARTER KIT) 25 MCG/ML SOLN Inhale 25 mcg/mL into the lungs 2 (two) times daily.  . hydrochlorothiazide (HYDRODIURIL) 12.5 MG tablet Take 12.5 mg by mouth daily.  Marland Kitchen ipratropium-albuterol (DUONEB) 0.5-2.5 (3) MG/3ML SOLN Take 3 mLs by nebulization every 6 (six) hours as needed.  . levETIRAcetam (KEPPRA) 500 MG tablet TAKE 1 TABLET BY MOUTH TWICE A DAY AS DIRECTED.  Marland Kitchen lovastatin (MEVACOR) 20 MG tablet Take 1 tablet (20 mg total) by mouth at bedtime.  . Melatonin 5 MG TABS Take 5 mg by mouth at bedtime.  . Multiple Vitamin (MULTIVITAMIN) tablet Take 1 tablet by mouth daily.  . NON FORMULARY Diet type:  NAS  . polyethylene glycol (MIRALAX / GLYCOLAX) packet Take 17 g by mouth daily as needed.   . potassium chloride (K-DUR,KLOR-CON) 10 MEQ tablet Take 10 mEq by mouth daily.  Marland Kitchen senna (SENOKOT) 8.6 MG TABS tablet Take 2 tablets by mouth at bedtime.   . SYMBICORT 80-4.5 MCG/ACT inhaler Inhale 2 puffs into the lungs 2 (two) times daily.   . [DISCONTINUED] ammonium lactate (LAC-HYDRIN) 12 % lotion Apply to right heel once a day   No facility-administered encounter medications on file as of 08/03/2019.      SIGNIFICANT DIAGNOSTIC EXAMS   LABS REVIEWED PREVIOUS  09-25-18" wbc  5.1; hgb 13.2; hct 42.0; mcv 94.2; plt 177; glucose 97; bun 21; creat 0.66; k+ 4.0; na++ 141 ca 8.9 01-02-19: wbc 5.3 hgb 12.7; hct 39.9; mcv 93.7; plt 162; glucose 98; bun 24; creat 0.72; k+ 4.2; na++142 ca 8.7 chol 145 ldl 87; tri 56; hdl 47   NO NEW LABS.    Review of Systems  Constitutional: Negative for malaise/fatigue.  Respiratory: Negative for cough and shortness of breath.   Cardiovascular: Negative for chest pain, palpitations and leg  swelling.  Gastrointestinal: Negative for abdominal pain, constipation and heartburn.  Musculoskeletal: Negative for back pain, joint pain and myalgias.  Skin: Negative.   Neurological: Negative for dizziness.  Psychiatric/Behavioral: Positive for depression. Negative for suicidal  ideas. The patient is not nervous/anxious and does not have insomnia.       Physical Exam Constitutional:      General: She is not in acute distress.    Appearance: She is well-developed. She is obese. She is not diaphoretic.  Neck:     Musculoskeletal: Neck supple.     Thyroid: No thyromegaly.  Cardiovascular:     Rate and Rhythm: Normal rate and regular rhythm.     Pulses: Normal pulses.     Heart sounds: Normal heart sounds.  Pulmonary:     Effort: Pulmonary effort is normal. No respiratory distress.     Breath sounds: Normal breath sounds.  Abdominal:     General: Bowel sounds are normal. There is no distension.     Palpations: Abdomen is soft.     Tenderness: There is no abdominal tenderness.  Musculoskeletal:     Right lower leg: Edema present.     Left lower leg: Edema present.     Comments: Trace bilateral lower extremity edema Is able to move all extremities Uses wheelchair  2016: left hip fracture left hip replacement      Lymphadenopathy:     Cervical: No cervical adenopathy.  Skin:    General: Skin is warm and dry.  Neurological:     Mental Status: She is alert. Mental status is at baseline.  Psychiatric:     Comments: Has sad affect      ASSESSMENT/ PLAN:  TODAY   1. Depression with anxiety:is grieving the loss of her roommate. Will continue her prozac 30 mg daily will not make medication changes will continue to monitor her status.      MD is aware of resident's narcotic use and is in agreement with current plan of care. We will attempt to wean resident as appropriate.  Ok Edwards NP [facility]  Contact [phone] Monday through Friday 8am- 5pm  After  hours call [phone]

## 2019-08-05 ENCOUNTER — Non-Acute Institutional Stay (SKILLED_NURSING_FACILITY): Payer: Medicare Other | Admitting: Internal Medicine

## 2019-08-05 ENCOUNTER — Encounter: Payer: Self-pay | Admitting: Internal Medicine

## 2019-08-05 DIAGNOSIS — J410 Simple chronic bronchitis: Secondary | ICD-10-CM | POA: Diagnosis not present

## 2019-08-05 DIAGNOSIS — K5909 Other constipation: Secondary | ICD-10-CM

## 2019-08-05 DIAGNOSIS — M810 Age-related osteoporosis without current pathological fracture: Secondary | ICD-10-CM | POA: Diagnosis not present

## 2019-08-05 NOTE — Progress Notes (Signed)
Location:  [facility] Room Number: 145/D Place of Service:  SNF (31)  Hennie Duos, MD  Patient Care Team: Hennie Duos, MD as PCP - General (Internal Medicine) [name], MD (Gastroenterology) [name] [name], NP as Nurse Practitioner ([facility] [place] ([place])  Extended Emergency Contact Information Primary Emergency Contact: [facility] Address: [address]          [name], [place] [address] [name] of [place] Phone: [phone] Mobile Phone: [phone] Relation: Daughter Secondary Emergency Contact: [name] [place] of [place] Mobile Phone: [phone] Relation: Son    Allergies: Patient has no known allergies.  Chief Complaint  Patient presents with  . Medical Management of Chronic Issues    Routine visit of medical managment    HPI: Patient is 85 y.o. female who is being seen for routine issues of chronic bronchitis, chronic constipation, and osteoporosis.  Past Medical History:  Diagnosis Date  . Diverticulosis OCT 2010 TCS   SIGMOID COLON  . Esophageal web 2010   DILATION 16 MM  . Gastritis, Helicobacter pylori 3235   Rx. ABO for 10 days   . GERD (gastroesophageal reflux disease)   . Hip fracture, left ([place])    2016  . Hyperlipidemia   . Hypertension   . Restless legs syndrome   . Seizures [facility]) January 2010   Post head trauma in fall .sees [name]    Past Surgical History:  Procedure Laterality Date  . ABDOMINAL HYSTERECTOMY    . COLONOSCOPY  OCT 2010   TORTUOUS, Sml IH, Tics, MULTIPLE SIMPLE ADENOMAS (<6MM)  . HIP ARTHROPLASTY Left 11/19/2014   Procedure: LEFT PARTIAL HIP REPLACEMENT;  Surgeon: [name], MD;  Location: [facility];  Service: Orthopedics;  Laterality: Left;  . ORIF HIP FRACTURE  04/05/2012   Procedure: OPEN REDUCTION INTERNAL FIXATION HIP;  Surgeon: [name], MD;  Location: [facility];  Service:  Orthopedics;  Laterality: Right;  . UPPER GASTROINTESTINAL ENDOSCOPY  AUG 2010   SAVARY  . VESICOVAGINAL FISTULA CLOSURE W/ TAH  1966   bleeding     Outpatient Encounter Medications as of 08/05/2019  Medication Sig  . acetaminophen (TYLENOL) 650 MG CR tablet Take 1,300 mg by mouth daily.   Marland Kitchen aspirin 81 MG chewable tablet Chew 81 mg by mouth daily.  . calcium-vitamin D (OSCAL WITH D) 500-200 MG-UNIT TABS TAKE (1) TABLET BY MOUTH (3) TIMES DAILY WITH MEALS.  Marland Kitchen docusate sodium (COLACE) 100 MG capsule Take 200 mg by mouth every evening.   Marland Kitchen FLUoxetine (PROZAC) 10 MG tablet Take 1 tablet 10 mg by mouth along with 20 mg to equal 30 mg once a day  . FLUoxetine (PROZAC) 20 MG tablet Take 1 tablet 20 mg along with 10 mg to equal 30 mg by mouth once a day  . Glucerna (GLUCERNA) LIQD Take 237 mLs by mouth 2 (two) times daily between meals.  . Glycopyrrolate (LONHALA MAGNAIR STARTER KIT) 25 MCG/ML SOLN Inhale 25 mcg/mL into the lungs 2 (two) times daily.  . hydrochlorothiazide (HYDRODIURIL) 12.5 MG tablet Take 12.5 mg by mouth daily.  Marland Kitchen ipratropium-albuterol (DUONEB) 0.5-2.5 (3) MG/3ML SOLN Take 3 mLs by nebulization every 6 (six) hours as needed.  . levETIRAcetam (KEPPRA) 500 MG tablet TAKE 1 TABLET BY MOUTH TWICE A DAY AS DIRECTED.  Marland Kitchen lovastatin (MEVACOR) 20 MG tablet Take 1 tablet (20 mg total) by mouth at bedtime.  . Melatonin 5 MG TABS  Take 5 mg by mouth at bedtime.  . Multiple Vitamin (MULTIVITAMIN) tablet Take 1 tablet by mouth daily.  . NON FORMULARY Diet type:  NAS  . polyethylene glycol (MIRALAX / GLYCOLAX) packet Take 17 g by mouth daily as needed.   . potassium chloride (K-DUR,KLOR-CON) 10 MEQ tablet Take 10 mEq by mouth daily.  Marland Kitchen senna (SENOKOT) 8.6 MG TABS tablet Take 2 tablets by mouth at bedtime.   . SYMBICORT 80-4.5 MCG/ACT inhaler Inhale 2 puffs into the lungs 2 (two) times daily.    No facility-administered encounter medications on file as of 08/05/2019.     No orders of the  defined types were placed in this encounter.   Immunization History  Administered Date(s) Administered  . Influenza Split 07/25/2012  . Influenza,inj,Quad PF,6+ Mos 07/30/2013, 08/18/2014  . Influenza-Unspecified 07/06/2016, 07/12/2017, 07/11/2018  . PPD Test 12/06/2014  . Pneumococcal Conjugate-13 04/29/2014  . Pneumococcal Polysaccharide-23 07/25/2012  . Pneumococcal-Unspecified 08/01/2016  . Tdap 08/08/2011, 06/20/2017    Social History   Tobacco Use  . Smoking status: Never Smoker  . Smokeless tobacco: Never Used  Substance Use Topics  . Alcohol use: No    Review of Systems   GENERAL:  no fevers, fatigue, appetite changes SKIN: No itching, rash HEENT: No complaint RESPIRATORY: No cough, wheezing, SOB CARDIAC: No chest pain, palpitations, lower extremity edema  GI: No abdominal pain, No N/V/D or constipation, No heartburn or reflux  GU: No dysuria, frequency or urgency, or incontinence  MUSCULOSKELETAL: No unrelieved bone/joint pain NEUROLOGIC: No headache, dizziness  PSYCHIATRIC: No overt anxiety or sadness  Vitals:   08/05/19 1038  BP: (!) 141/79  Pulse: 67  Resp: 20  Temp: (!) 97.3 F (36.3 C)  SpO2: 95%   Body mass index is 36.87 kg/m. Physical Exam  GENERAL APPEARANCE: Alert, No acute distress  SKIN: No diaphoresis rash HEENT: Unremarkable RESPIRATORY: Breathing is even, unlabored. Lung sounds are clear   CARDIOVASCULAR: Heart RRR no murmurs, rubs or gallops. No peripheral edema  GASTROINTESTINAL: Abdomen is soft, non-tender, not distended w/ normal bowel sounds.  GENITOURINARY: Bladder non tender, not distended  MUSCULOSKELETAL: No abnormal joints or musculature NEUROLOGIC: Cranial nerves 2-12 grossly intact. Moves all extremities PSYCHIATRIC: Mood and affect appropriate with moderate dementia, no behavioral issues  Patient Active Problem List   Diagnosis Date Noted  . Coronavirus infection 07/12/2019  . Osteoporosis 12/31/2018  . Hypokalemia  11/24/2018  . Chronic generalized pain 11/24/2018  . Chronic bronchitis ([place]) 10/14/2017  . DVT of popliteal vein ([place]) 10/05/2012  . Obsessive-compulsive disorder 08/03/2010  . Chronic constipation 07/13/2009  . Seizure disorder ([place]) 06/11/2009  . Hyperlipemia 06/09/2009  . Depression with anxiety 06/09/2009  . Essential hypertension 06/09/2009    CMP     Component Value Date/Time   NA 132 (L) 07/13/2019 0800   K 3.5 07/13/2019 0800   CL 93 (L) 07/13/2019 0800   CO2 27 07/13/2019 0800   GLUCOSE 99 07/13/2019 0800   BUN 15 07/13/2019 0800   CREATININE 0.66 07/13/2019 0800   CREATININE 0.61 11/08/2014 1038   CALCIUM 8.1 (L) 07/13/2019 0800   PROT 6.7 07/13/2019 0800   ALBUMIN 3.5 07/13/2019 0800   AST 27 07/13/2019 0800   ALT 17 07/13/2019 0800   ALKPHOS 37 (L) 07/13/2019 0800   BILITOT 0.5 07/13/2019 0800   GFRNONAA >60 07/13/2019 0800   GFRAA >60 07/13/2019 0800   Recent Labs    09/25/18 0750 01/02/19 0700 07/13/19 0800  NA 141 142 132*  K 4.0 4.2 3.5  CL 104 105 93*  CO2 _0 GLUCOSE 97 98 99  BUN 21 24* 15  CREATININE 0.66 0.72 0.66  CALCIUM 8.9 8.7* 8.1*   Recent Labs    07/13/19 0800  AST 27  ALT 17  ALKPHOS 37*  BILITOT 0.5  PROT 6.7  ALBUMIN 3.5   Recent Labs    09/25/18 0750 01/02/19 0700 07/13/19 0800  WBC 5.1 5.3 3.0*  NEUTROABS 2.2 2.4  --   HGB 13.2 12.7 12.5  HCT 42.0 39.9 38.6  MCV 94.2 93.7 92.3  PLT 177 162 117*   Recent Labs    01/02/19 0700 07/13/19 0800  CHOL 145 126  LDLCALC 87 81  TRIG 56 41   No results found for: [facility] Lab Results  Component Value Date   TSH 1.590 06/19/2017   Lab Results  Component Value Date   HGBA1C 5.5 01/31/2018   Lab Results  Component Value Date   CHOL 126 07/13/2019   HDL 37 (L) 07/13/2019   LDLCALC 81 07/13/2019   TRIG 41 07/13/2019   CHOLHDL 3.4 07/13/2019    Significant Diagnostic Results in last 30 days:  No results found.  Assessment and Plan  Chronic  bronchitis ([place]) Not active at this time; continue Symbicort 2 puffs 2 times daily, lonhalla 25 mcg inhaled twice daily and as needed albuterol  Chronic constipation Chronic and stable; continue Colace 100 mg 2 p.o. every morning and Senokot 2 p.o. nightly with as needed MiraLAX  Osteoporosis Was treated with Fosamax for 5 years; continue calcium with vitamin D 500-201 p.o. 3 times daily with meals     Hennie Duos, MD

## 2019-08-09 ENCOUNTER — Encounter: Payer: Self-pay | Admitting: Internal Medicine

## 2019-08-09 NOTE — Assessment & Plan Note (Signed)
Chronic and stable; continue Colace 100 mg 2 p.o. every morning and Senokot 2 p.o. nightly with as needed MiraLAX

## 2019-08-09 NOTE — Assessment & Plan Note (Addendum)
Not active at this time; continue Symbicort 2 puffs 2 times daily, lonhalla 25 mcg inhaled twice daily and as needed albuterol

## 2019-08-09 NOTE — Assessment & Plan Note (Signed)
Was treated with Fosamax for 5 years; continue calcium with vitamin D 500-201 p.o. 3 times daily with meals

## 2019-09-01 ENCOUNTER — Encounter: Payer: Self-pay | Admitting: Adult Health

## 2019-09-01 ENCOUNTER — Non-Acute Institutional Stay (SKILLED_NURSING_FACILITY): Payer: Medicare Other | Admitting: Adult Health

## 2019-09-01 DIAGNOSIS — K5909 Other constipation: Secondary | ICD-10-CM

## 2019-09-01 DIAGNOSIS — I10 Essential (primary) hypertension: Secondary | ICD-10-CM | POA: Diagnosis not present

## 2019-09-01 DIAGNOSIS — I82539 Chronic embolism and thrombosis of unspecified popliteal vein: Secondary | ICD-10-CM | POA: Diagnosis not present

## 2019-09-01 DIAGNOSIS — G8929 Other chronic pain: Secondary | ICD-10-CM

## 2019-09-01 DIAGNOSIS — R52 Pain, unspecified: Secondary | ICD-10-CM

## 2019-09-01 DIAGNOSIS — E785 Hyperlipidemia, unspecified: Secondary | ICD-10-CM

## 2019-09-01 DIAGNOSIS — J410 Simple chronic bronchitis: Secondary | ICD-10-CM | POA: Diagnosis not present

## 2019-09-01 DIAGNOSIS — E876 Hypokalemia: Secondary | ICD-10-CM

## 2019-09-01 DIAGNOSIS — G40909 Epilepsy, unspecified, not intractable, without status epilepticus: Secondary | ICD-10-CM

## 2019-09-01 DIAGNOSIS — F429 Obsessive-compulsive disorder, unspecified: Secondary | ICD-10-CM

## 2019-09-01 NOTE — Progress Notes (Signed)
Location:    [facility] Room Number: 145/D Place of Service:  SNF (31)   CODE STATUS: Full Code  No Known Allergies  Chief Complaint  Patient presents with  . Annual Exam    Routine Annual Exam    HPI:  She is a 86 year old long term resident of this facility being seen for her annual exam. She has not been hospitalized. She has had COVID 19 this past year. She has lost a sibling and her roommate this past year. Her weight is stable; she has had falls over the past year. She denies any uncontrolled pain. Her appetite is good. She is sleeping well at night. She continues to be followed for her chronic illnesses including: hypertension; seizure; hypokalemia.   Past Medical History:  Diagnosis Date  . Diverticulosis OCT 2010 TCS   SIGMOID COLON  . Esophageal web 2010   DILATION 16 MM  . Gastritis, Helicobacter pylori 2010   Rx. ABO for 10 days   . GERD (gastroesophageal reflux disease)   . Hip fracture, left ([facility])    2016  . Hyperlipidemia   . Hypertension   . Restless legs syndrome   . Seizures ([facility]) January 2010   Post head trauma in fall .sees Dr [place]    Past Surgical History:  Procedure Laterality Date  . ABDOMINAL HYSTERECTOMY    . COLONOSCOPY  OCT 2010   TORTUOUS, Sml IH, Tics, MULTIPLE SIMPLE ADENOMAS (<6MM)  . HIP ARTHROPLASTY Left 11/19/2014   Procedure: LEFT PARTIAL HIP REPLACEMENT;  Surgeon: Stanley E Harrison, MD;  Location: [facility];  Service: Orthopedics;  Laterality: Left;  . ORIF HIP FRACTURE  04/05/2012   Procedure: OPEN REDUCTION INTERNAL FIXATION HIP;  Surgeon: Stanley E Harrison, MD;  Location: [facility];  Service: Orthopedics;  Laterality: Right;  . UPPER GASTROINTESTINAL ENDOSCOPY  AUG 2010   SAVARY  . VESICOVAGINAL FISTULA CLOSURE W/ TAH  1966   bleeding     Social History   Socioeconomic History  . Marital status: Widowed    Spouse name: Not on file  . Number of children: Not on file  . Years of education: Not on file   . Highest education level: Not on file  Occupational History  . 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[employer]   Social Needs  . Financial resource strain: Not hard at all  . Food insecurity    Worry: Never true    Inability: Never true  . Transportation needs    Medical: No    Non-medical: No  Tobacco Use  . Smoking status: Never Smoker  . Smokeless tobacco: Never Used  Substance and Sexual Activity  . Alcohol use: No  . Drug use: No  . Sexual activity: Never  Lifestyle  . Physical activity    Days per week: 4 days    Minutes per session: 20 min  . Stress: To some extent  Relationships  . Social connections    Talks on phone: Twice a week    Gets together: Once a week    Attends religious service: Never    Active member of club or organization: No    Attends meetings of clubs or organizations: Never    Relationship status: Widowed  . Intimate partner violence    Fear of current or ex partner: No    Emotionally abused: No    Physically abused: No    Forced sexual activity: No  Other Topics Concern  . Not on file    Social History Narrative  . Not on file   Family History  Problem Relation Age of Onset  . Cancer Mother        bladder   . Diabetes Mother   . Diabetes Father   . Emphysema Father   . GER disease Brother   . Colon cancer Neg Hx   . Colon polyps Neg Hx       VITAL SIGNS BP 134/76   Pulse 66   Temp 98 F (36.7 C) (Oral)   Resp 20   Ht 5' 7" (1.702 m)   Wt 240 lb 6.4 oz (109 kg)   SpO2 95%   BMI 37.65 kg/m   Outpatient Encounter Medications as of 09/01/2019  Medication Sig  . acetaminophen (TYLENOL) 325 MG tablet Take 650 mg by mouth every 4 (four) hours as needed.  . acetaminophen (TYLENOL) 650 MG CR tablet Take 1,300 mg by mouth daily.   . aspirin 81 MG chewable tablet Chew 81 mg by mouth daily.  . calcium-vitamin D (OSCAL WITH D) 500-200 MG-UNIT TABS TAKE (1) TABLET BY MOUTH (3) TIMES DAILY WITH MEALS.  . docusate sodium  (COLACE) 100 MG capsule Take 200 mg by mouth every evening.   . FLUoxetine (PROZAC) 10 MG tablet Take 1 tablet 10 mg by mouth along with 20 mg to equal 30 mg once a day  . FLUoxetine (PROZAC) 20 MG tablet Take 1 tablet 20 mg along with 10 mg to equal 30 mg by mouth once a day  . Glucerna (GLUCERNA) LIQD Take 237 mLs by mouth 2 (two) times daily between meals.  . Glycopyrrolate (LONHALA MAGNAIR STARTER KIT) 25 MCG/ML SOLN Inhale 25 mcg/mL into the lungs 2 (two) times daily.  . hydrochlorothiazide (HYDRODIURIL) 12.5 MG tablet Take 12.5 mg by mouth daily.  . ipratropium-albuterol (DUONEB) 0.5-2.5 (3) MG/3ML SOLN Take 3 mLs by nebulization every 6 (six) hours as needed.  . levETIRAcetam (KEPPRA) 500 MG tablet TAKE 1 TABLET BY MOUTH TWICE A DAY AS DIRECTED.  . lovastatin (MEVACOR) 20 MG tablet Take 1 tablet (20 mg total) by mouth at bedtime.  . Melatonin 5 MG TABS Take 5 mg by mouth at bedtime.  . Multiple Vitamin (MULTIVITAMIN) tablet Take 1 tablet by mouth daily.  . NON FORMULARY Diet type:  NAS  . polyethylene glycol (MIRALAX / GLYCOLAX) packet Take 17 g by mouth daily as needed.   . potassium chloride (K-DUR,KLOR-CON) 10 MEQ tablet Take 10 mEq by mouth daily.  . senna (SENOKOT) 8.6 MG TABS tablet Take 2 tablets by mouth at bedtime.   . SYMBICORT 80-4.5 MCG/ACT inhaler Inhale 2 puffs into the lungs 2 (two) times daily.    No facility-administered encounter medications on file as of 09/01/2019.      SIGNIFICANT DIAGNOSTIC EXAMS   LABS REVIEWED PREVIOUS  09-25-18" wbc  5.1; hgb 13.2; hct 42.0; mcv 94.2; plt 177; glucose 97; bun 21; creat 0.66; k+ 4.0; na++ 141 ca 8.9 01-02-19: wbc 5.3 hgb 12.7; hct 39.9; mcv 93.7; plt 162; glucose 98; bun 24; creat 0.72; k+ 4.2; na++142 ca 8.7 chol 145 ldl 87; tri 56; hdl 47   TODAY  07-13-19: wbc 3.0; hgb 12.5; hct 38.6; mcv 92.3 plt 117; glucose 99; bun 15; creat 0.66; k+ 3.5; na++ 132; ca 8.1; liver normal albumin 35; chol 126; ldl 81; trig 41; hdl 37    Review of Systems  Constitutional: Negative for malaise/fatigue.  Respiratory: Negative for cough and shortness of breath.   Cardiovascular:   Negative for chest pain, palpitations and leg swelling.  Gastrointestinal: Negative for abdominal pain, constipation and heartburn.  Musculoskeletal: Negative for back pain, joint pain and myalgias.  Skin: Negative.   Neurological: Negative for dizziness.  Psychiatric/Behavioral: The patient is not nervous/anxious.     Physical Exam Constitutional:      General: She is not in acute distress.    Appearance: She is well-developed. She is obese. She is not diaphoretic.  HENT:     Mouth/Throat:     Mouth: Mucous membranes are moist.     Pharynx: Oropharynx is clear.  Eyes:     Conjunctiva/sclera: Conjunctivae normal.  Neck:     Musculoskeletal: Neck supple.     Thyroid: No thyromegaly.  Cardiovascular:     Rate and Rhythm: Normal rate and regular rhythm.     Pulses: Normal pulses.     Heart sounds: Normal heart sounds.  Pulmonary:     Effort: Pulmonary effort is normal. No respiratory distress.     Breath sounds: Normal breath sounds.  Abdominal:     General: Bowel sounds are normal. There is no distension.     Palpations: Abdomen is soft.     Tenderness: There is no abdominal tenderness.  Musculoskeletal:     Right lower leg: Edema present.     Left lower leg: Edema present.     Comments: Trace bilateral lower extremity edema Is able to move all extremities Uses wheelchair  2016: left hip fracture left hip replacement       Lymphadenopathy:     Cervical: No cervical adenopathy.  Skin:    General: Skin is warm and dry.  Neurological:     Mental Status: She is alert. Mental status is at baseline.  Psychiatric:        Mood and Affect: Mood normal.      ASSESSMENT/ PLAN:  TODAY:   1. Simple chronic bronchitis is stable will continue symbicort 80/4.5 mcg 2 puffs twice daily glycopyrrolate 25 mcg neb twice daily will monitor   2. Chronic constipation: is stable will continue colace 200 mg daily senna 2 tabs daily miralax daily as needed  3. Hypokalemia; is stable k+ 3.5 will continue k+ 10 meq daily   4. Unspecified hyperlipidemia: is stable LDL 81 will continue mevacor 20 mg daily   5. Unspecified obsessive compulsive disorder: is stable will continue prozac 30 mg daily and melatonin 5 mg daily   6. Chronic generalized pain is stable will continue tylenol 1300 mg daily   7. Seizure disorder: is stable no reports of seizure activity will continue keppra 500 mg twice daily   8. Chronic deep vein thrombosis (DVT) popliteal vein unspecified laterality: is stable will monitor   9. Essential hypertension: is stable b/p 134/76 will continue hctz 12.5 mg daily   Will set up DEXA scan    MD is aware of resident's narcotic use and is in agreement with current plan of care. We will attempt to wean resident as appropriate.    NP [facility]  Contact [phone] Monday through Friday 8am- 5pm  After hours call [phone]   

## 2019-09-13 ENCOUNTER — Other Ambulatory Visit (HOSPITAL_COMMUNITY)
Admission: RE | Admit: 2019-09-13 | Discharge: 2019-09-13 | Disposition: A | Discharge status: Home / Self Care | Payer: Medicare Other | Source: Ambulatory Visit | Attending: Internal Medicine | Admitting: Internal Medicine

## 2019-09-13 DIAGNOSIS — Z20828 Contact with and (suspected) exposure to other viral communicable diseases: Secondary | ICD-10-CM | POA: Insufficient documentation

## 2019-09-15 LAB — SARS CORONAVIRUS 2 (TAT 6-24 HRS): SARS Coronavirus 2: NEGATIVE

## 2019-09-19 ENCOUNTER — Other Ambulatory Visit: Payer: Self-pay | Admitting: Internal Medicine

## 2019-09-19 ENCOUNTER — Other Ambulatory Visit (HOSPITAL_COMMUNITY)
Admission: RE | Admit: 2019-09-19 | Discharge: 2019-09-19 | Disposition: A | Discharge status: Home / Self Care | Payer: Medicare Other | Source: Skilled Nursing Facility | Attending: Internal Medicine | Admitting: Internal Medicine

## 2019-09-19 DIAGNOSIS — Z20828 Contact with and (suspected) exposure to other viral communicable diseases: Secondary | ICD-10-CM | POA: Diagnosis not present

## 2019-09-19 DIAGNOSIS — Z9189 Other specified personal risk factors, not elsewhere classified: Secondary | ICD-10-CM | POA: Diagnosis present

## 2019-09-20 LAB — SARS CORONAVIRUS 2 (TAT 6-24 HRS): SARS Coronavirus 2: NEGATIVE

## 2019-09-29 ENCOUNTER — Non-Acute Institutional Stay (SKILLED_NURSING_FACILITY): Payer: Medicare Other | Admitting: Adult Health

## 2019-09-29 DIAGNOSIS — J410 Simple chronic bronchitis: Secondary | ICD-10-CM | POA: Diagnosis not present

## 2019-09-29 DIAGNOSIS — I10 Essential (primary) hypertension: Secondary | ICD-10-CM

## 2019-09-29 DIAGNOSIS — E669 Obesity, unspecified: Secondary | ICD-10-CM

## 2019-09-30 ENCOUNTER — Encounter: Payer: Self-pay | Admitting: Adult Health

## 2019-09-30 ENCOUNTER — Encounter: Payer: Self-pay | Admitting: Internal Medicine

## 2019-09-30 ENCOUNTER — Non-Acute Institutional Stay (SKILLED_NURSING_FACILITY): Payer: Medicare Other | Admitting: Internal Medicine

## 2019-09-30 DIAGNOSIS — F0281 Dementia in other diseases classified elsewhere with behavioral disturbance: Secondary | ICD-10-CM | POA: Diagnosis not present

## 2019-09-30 DIAGNOSIS — E785 Hyperlipidemia, unspecified: Secondary | ICD-10-CM

## 2019-09-30 DIAGNOSIS — F02818 Dementia in other diseases classified elsewhere, unspecified severity, with other behavioral disturbance: Secondary | ICD-10-CM

## 2019-09-30 DIAGNOSIS — E876 Hypokalemia: Secondary | ICD-10-CM

## 2019-09-30 DIAGNOSIS — G301 Alzheimer's disease with late onset: Secondary | ICD-10-CM

## 2019-09-30 NOTE — Progress Notes (Signed)
Location:    [facility] Room Number: 116/D Place of Service:  SNF (31)   CODE STATUS: Full Code  No Known Allergies  Chief Complaint  Patient presents with  . Acute Visit     COVID Vaccine Permission    HPI:  We have the mederna vaccine. Her risk factors include: age; long term resident of SNF: hypertension; obesity  chronic bronchitis. I have discussed with the family the 2 injection process; effectiveness; side effects and possible severe reactions. Her family has agreed to the vaccine. There are no reports of cough; shortness of breath; uncontrolled pain. She has had covid in October of this year.    Past Medical History:  Diagnosis Date  . Diverticulosis OCT 2010 TCS   SIGMOID COLON  . Esophageal web 2010   DILATION 16 MM  . Gastritis, Helicobacter pylori 1941   Rx. ABO for 10 days   . GERD (gastroesophageal reflux disease)   . Hip fracture, left ([place])    2016  . Hyperlipidemia   . Hypertension   . Restless legs syndrome   . Seizures [facility]) January 2010   Post head trauma in fall .sees [name]    Past Surgical History:  Procedure Laterality Date  . ABDOMINAL HYSTERECTOMY    . COLONOSCOPY  OCT 2010   TORTUOUS, Sml IH, Tics, MULTIPLE SIMPLE ADENOMAS (<6MM)  . HIP ARTHROPLASTY Left 11/19/2014   Procedure: LEFT PARTIAL HIP REPLACEMENT;  Surgeon: [name], MD;  Location: [facility] ORS;  Service: Orthopedics;  Laterality: Left;  . ORIF HIP FRACTURE  04/05/2012   Procedure: OPEN REDUCTION INTERNAL FIXATION HIP;  Surgeon: [name], MD;  Location: [facility]S;  Service: Orthopedics;  Laterality: Right;  . UPPER GASTROINTESTINAL ENDOSCOPY  AUG 2010   SAVARY  . VESICOVAGINAL FISTULA CLOSURE W/ TAH  1966   bleeding     Social History   Socioeconomic History  . Marital status: Widowed    Spouse name: Not on file  . Number of children: Not on file  . Years of education: Not on file  . Highest education level: Not on file  Occupational  History  . 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 Smoking status: Never Smoker  . Smokeless tobacco: Never Used  Substance and Sexual Activity  . Alcohol use: No  . Drug use: No  . Sexual activity: Never  Other Topics Concern  . Not on file  Social History Narrative  . Not on file   Social Determinants of Health   Financial Resource Strain:   . Difficulty of Paying Living Expenses: Not on file  Food Insecurity:   . Worried About Charity fundraiser in the Last Year: Not on file  . Ran Out of Food in the Last Year: Not on file  Transportation Needs:   . Lack of Transportation (Medical): Not on file  . Lack of Transportation (Non-Medical): Not on file  Physical Activity:   . Days of Exercise per Week: Not on file  . Minutes of Exercise per Session: Not on file  Stress:   . Feeling of Stress : Not on file  Social Connections:   . Frequency of Communication with Friends and Family: Not on file  . Frequency of Social Gatherings with Friends and Family: Not on file  . Attends Religious Services: Not on file  . Active Member of Clubs or Organizations: Not on file  . Attends Archivist Meetings:  Not on file  . Marital Status: Not on file  Intimate Partner Violence:   . Fear of Current or Ex-Partner: Not on file  . Emotionally Abused: Not on file  . Physically Abused: Not on file  . Sexually Abused: Not on file   Family History  Problem Relation Age of Onset  . Cancer Mother        bladder   . Diabetes Mother   . Diabetes Father   . Emphysema Father   . GER disease Brother   . Colon cancer Neg Hx   . Colon polyps Neg Hx       VITAL SIGNS BP 128/76   Pulse 73   Temp 99 F (37.2 C) (Oral)   Resp 18   Ht '5\' 7"'$  (1.702 m)   Wt 241 lb 12.8 oz (109.7 kg)   SpO2 95%   BMI 37.87 kg/m   Outpatient Encounter Medications as of 09/29/2019  Medication Sig  . acetaminophen (TYLENOL) 325 MG tablet Take 650 mg by mouth every 4  (four) hours as needed.  Marland Kitchen acetaminophen (TYLENOL) 650 MG CR tablet Take 1,300 mg by mouth daily.   Marland Kitchen ascorbic acid (VITAMIN C) 500 MG tablet Take 500 mg by mouth daily.  Marland Kitchen aspirin 81 MG chewable tablet Chew 81 mg by mouth daily.  . calcium-vitamin D (OSCAL WITH D) 500-200 MG-UNIT TABS TAKE (1) TABLET BY MOUTH (3) TIMES DAILY WITH MEALS.  Marland Kitchen docusate sodium (COLACE) 100 MG capsule Take 200 mg by mouth every evening.   Marland Kitchen FLUoxetine (PROZAC) 10 MG tablet Take 1 tablet 10 mg by mouth along with 20 mg to equal 30 mg once a day  . FLUoxetine (PROZAC) 20 MG tablet Take 1 tablet 20 mg along with 10 mg to equal 30 mg by mouth once a day  . Glucerna (GLUCERNA) LIQD Take 237 mLs by mouth 2 (two) times daily between meals.  . Glycopyrrolate (LONHALA MAGNAIR STARTER KIT) 25 MCG/ML SOLN Inhale 25 mcg/mL into the lungs 2 (two) times daily.  . hydrochlorothiazide (HYDRODIURIL) 12.5 MG tablet Take 12.5 mg by mouth daily.  Marland Kitchen ipratropium-albuterol (DUONEB) 0.5-2.5 (3) MG/3ML SOLN Take 3 mLs by nebulization every 6 (six) hours as needed.  . levETIRAcetam (KEPPRA) 500 MG tablet TAKE 1 TABLET BY MOUTH TWICE A DAY AS DIRECTED.  Marland Kitchen lovastatin (MEVACOR) 20 MG tablet Take 1 tablet (20 mg total) by mouth at bedtime.  . Melatonin 5 MG TABS Take 5 mg by mouth at bedtime.  . memantine (NAMENDA) 5 MG tablet Take 5 mg by mouth daily.   Derrill Memo ON 10/09/2019] memantine (NAMENDA) 5 MG tablet Take 5 mg by mouth 2 (two) times daily.  . Multiple Vitamin (MULTIVITAMIN) tablet Take 1 tablet by mouth daily.  . NON FORMULARY Diet type:  NAS  . polyethylene glycol (MIRALAX / GLYCOLAX) packet Take 17 g by mouth daily as needed.   . potassium chloride (K-DUR,KLOR-CON) 10 MEQ tablet Take 10 mEq by mouth daily.  Marland Kitchen senna (SENOKOT) 8.6 MG TABS tablet Take 2 tablets by mouth at bedtime.   . SYMBICORT 80-4.5 MCG/ACT inhaler Inhale 2 puffs into the lungs 2 (two) times daily.   Marland Kitchen zinc sulfate 220 (50 Zn) MG capsule Take 220 mg by mouth daily.    No facility-administered encounter medications on file as of 09/29/2019.     SIGNIFICANT DIAGNOSTIC EXAMS  LABS REVIEWED PREVIOUS  09-25-18" wbc  5.1; hgb 13.2; hct 42.0; mcv 94.2; plt 177; glucose 97; bun 21;  creat 0.66; k+ 4.0; na++ 141 ca 8.9 01-02-19: wbc 5.3 hgb 12.7; hct 39.9; mcv 93.7; plt 162; glucose 98; bun 24; creat 0.72; k+ 4.2; na++142 ca 8.7 chol 145 ldl 87; tri 56; hdl 47  07-13-19: wbc 3.0; hgb 12.5; hct 38.6; mcv 92.3 plt 117; glucose 99; bun 15; creat 0.66; k+ 3.5; na++ 132; ca 8.1; liver normal albumin 35; chol 126; ldl 81; trig 41; hdl 37  NO NEW LABS.   Review of Systems  Constitutional: Negative for malaise/fatigue.  Respiratory: Negative for cough and shortness of breath.   Cardiovascular: Negative for chest pain, palpitations and leg swelling.  Gastrointestinal: Negative for abdominal pain, constipation and heartburn.  Musculoskeletal: Negative for back pain, joint pain and myalgias.  Skin: Negative.   Neurological: Negative for dizziness.  Psychiatric/Behavioral: The patient is not nervous/anxious.     Physical Exam Constitutional:      General: She is not in acute distress.    Appearance: She is well-developed. She is obese. She is not diaphoretic.  Neck:     Thyroid: No thyromegaly.  Cardiovascular:     Rate and Rhythm: Normal rate and regular rhythm.     Pulses: Normal pulses.     Heart sounds: Normal heart sounds.  Pulmonary:     Effort: Pulmonary effort is normal. No respiratory distress.     Breath sounds: Normal breath sounds.  Abdominal:     General: Bowel sounds are normal. There is no distension.     Palpations: Abdomen is soft.     Tenderness: There is no abdominal tenderness.  Musculoskeletal:     Cervical back: Neck supple.     Right lower leg: No edema.     Left lower leg: No edema.     Comments: Is able to move all extremities Uses wheelchair  2016: left hip fracture left hip replacement        Lymphadenopathy:     Cervical:  No cervical adenopathy.  Skin:    General: Skin is warm and dry.  Neurological:     Mental Status: She is alert. Mental status is at baseline.  Psychiatric:        Mood and Affect: Mood normal.      ASSESSMENT/ PLAN:  TODAY  1. Essential hypertension 2. Simple chronic bronchitis 3. Obesity 4. Advanced age 1. Long term resident of snf  Will setup to receive the first dose on 10-06-19.     MD is aware of resident's narcotic use and is in agreement with current plan of care. We will attempt to wean resident as appropriate.  Alejandra Edwards NP [facility]  Contact [phone] Monday through Friday 8am- 5pm  After hours call [phone]

## 2019-09-30 NOTE — Progress Notes (Signed)
Location:  [name] Room Number: 116-D Place of Service:  SNF (31)  Alejandra Duos, MD  Patient Care Team: Alejandra Duos, MD as PCP - General (Internal Medicine) Alejandra Binder, MD (Gastroenterology) Alejandra Cruz Alejandra Bougie, NP as Nurse Practitioner ([place]) [facility], [place] ([place])  Extended Emergency Contact Information Primary Emergency Contact: Alejandra Cruz Address: [address]          Alejandra Cruz, [place] [address] Alejandra Cruz of [place] Phone: [phone] Mobile Phone: [phone] Relation: Daughter Secondary Emergency Contact: Alejandra Cruz [place] of [place] Mobile Phone: [phone] Relation: Son    Allergies: Patient has no known allergies.  Chief Complaint  Patient presents with  . Medical Management of Chronic Issues    Routine [place] visit    HPI: Patient is an 85 y.o. female who is being seen for routine issues of hyperlipidemia, hypokalemia, and dementia.  Past Medical History:  Diagnosis Date  . Diverticulosis OCT 2010 TCS   SIGMOID COLON  . Esophageal web 2010   DILATION 16 MM  . Gastritis, Helicobacter pylori 9379   Rx. ABO for 10 days   . GERD (gastroesophageal reflux disease)   . Hip fracture, left ([name])    2016  . Hyperlipidemia   . Hypertension   . Restless legs syndrome   . Seizures [facility]) January 2010   Post head trauma in fall .sees [name]    Past Surgical History:  Procedure Laterality Date  . ABDOMINAL HYSTERECTOMY    . COLONOSCOPY  OCT 2010   TORTUOUS, Sml IH, Tics, MULTIPLE SIMPLE ADENOMAS (<6MM)  . HIP ARTHROPLASTY Left 11/19/2014   Procedure: LEFT PARTIAL HIP REPLACEMENT;  Surgeon: [name], MD;  Location: [facility]S;  Service: Orthopedics;  Laterality: Left;  . ORIF HIP FRACTURE  04/05/2012   Procedure: OPEN REDUCTION INTERNAL FIXATION HIP;  Surgeon: [name], MD;  Location: [facility];  Service: Orthopedics;   Laterality: Right;  . UPPER GASTROINTESTINAL ENDOSCOPY  AUG 2010   SAVARY  . VESICOVAGINAL FISTULA CLOSURE W/ TAH  1966   bleeding     Allergies as of 09/30/2019   No Known Allergies     Medication List    Notice   This visit is during an admission. Changes to the med list made in this visit will be reflected in the After Visit Summary of the admission.    Current Outpatient Medications on File Prior to Visit  Medication Sig Dispense Refill  . acetaminophen (TYLENOL) 325 MG tablet Take 650 mg by mouth every 4 (four) hours as needed.    Marland Kitchen acetaminophen (TYLENOL) 650 MG CR tablet Take 1,300 mg by mouth daily.     Marland Kitchen aspirin 81 MG chewable tablet Chew 81 mg by mouth daily.    . calcium-vitamin D (OSCAL WITH D) 500-200 MG-UNIT TABS TAKE (1) TABLET BY MOUTH (3) TIMES DAILY WITH MEALS. 90 each 3  . docusate sodium (COLACE) 100 MG capsule Take 200 mg by mouth every morning.     Marland Kitchen FLUoxetine (PROZAC) 10 MG tablet Take 1 tablet 10 mg by mouth along with 20 mg to equal 30 mg once a day    . FLUoxetine (PROZAC) 20 MG tablet Take 1 tablet 20 mg along with 10 mg to equal 30 mg by mouth once a day    . Glycopyrrolate (LONHALA MAGNAIR STARTER KIT) 25 MCG/ML SOLN Inhale 25 mcg/mL into the lungs 2 (two) times daily.    Marland Kitchen  hydrochlorothiazide (HYDRODIURIL) 12.5 MG tablet Take 12.5 mg by mouth daily.    Marland Kitchen ipratropium-albuterol (DUONEB) 0.5-2.5 (3) MG/3ML SOLN Take 3 mLs by nebulization every 6 (six) hours as needed.    . levETIRAcetam (KEPPRA) 500 MG tablet TAKE 1 TABLET BY MOUTH TWICE A DAY AS DIRECTED. 60 tablet 5  . lovastatin (MEVACOR) 20 MG tablet Take 1 tablet (20 mg total) by mouth at bedtime. 30 tablet 5  . Melatonin 5 MG TABS Take 5 mg by mouth at bedtime.    . memantine (NAMENDA) 5 MG tablet Take 5 mg by mouth daily.     . NON FORMULARY Diet type:  NAS    . polyethylene glycol (MIRALAX / GLYCOLAX) packet Take 17 g by mouth daily as needed.     . potassium chloride (K-DUR,KLOR-CON) 10 MEQ  tablet Take 10 mEq by mouth daily.    Marland Kitchen senna (SENOKOT) 8.6 MG TABS tablet Take 2 tablets by mouth at bedtime.     . SYMBICORT 80-4.5 MCG/ACT inhaler Inhale 2 puffs into the lungs 2 (two) times daily.     Derrill Memo ON 10/09/2019] memantine (NAMENDA) 5 MG tablet Take 5 mg by mouth 2 (two) times daily.     No current facility-administered medications on file prior to visit.     No orders of the defined types were placed in this encounter.   Immunization History  Administered Date(s) Administered  . Influenza Split 07/25/2012  . Influenza,inj,Quad PF,6+ Mos 07/30/2013, 08/18/2014  . Influenza-Unspecified 07/06/2016, 07/12/2017, 07/11/2018  . PPD Test 12/06/2014  . Pneumococcal Conjugate-13 04/29/2014  . Pneumococcal Polysaccharide-23 07/25/2012  . Pneumococcal-Unspecified 08/01/2016  . Tdap 08/08/2011, 06/20/2017    Social History   Tobacco Use  . Smoking status: Never Smoker  . Smokeless tobacco: Never Used  Substance Use Topics  . Alcohol use: No    Review of Systems   GENERAL:  no fevers, fatigue, appetite changes SKIN: No itching, rash HEENT: No complaint RESPIRATORY: No cough, wheezing, SOB CARDIAC: No chest pain, palpitations, lower extremity edema  GI: No abdominal pain, No N/V/D or constipation, No heartburn or reflux  GU: No dysuria, frequency or urgency, or incontinence  MUSCULOSKELETAL: No unrelieved bone/joint pain NEUROLOGIC: No headache, dizziness  PSYCHIATRIC: No overt anxiety or sadness  Vitals:   09/30/19 0937  BP: 128/76  Pulse: 73  Resp: 18  Temp: 99 F (37.2 C)  SpO2: 95%   Body mass index is 37.87 kg/m. Physical Exam  GENERAL APPEARANCE: Alert, conversant, No acute distress  SKIN: No diaphoresis rash HEENT: Unremarkable RESPIRATORY: Breathing is even, unlabored. Lung sounds are clear   CARDIOVASCULAR: Heart RRR no murmurs, rubs or gallops.  TED hose, no peripheral edema  GASTROINTESTINAL: Abdomen is soft, non-tender, not distended w/  normal bowel sounds.  GENITOURINARY: Bladder non tender, not distended  MUSCULOSKELETAL: No abnormal joints or musculature NEUROLOGIC: Cranial nerves 2-12 grossly intact. Moves all extremities PSYCHIATRIC: Mood and affect appropriate to situation, no behavioral issues  Patient Active Problem List   Diagnosis Date Noted  . Dementia with behavioral disturbance ([place]) 10/08/2019  . Obesity (BMI 30-39.9) 10/05/2019  . Coronavirus infection 07/12/2019  . Osteoporosis 12/31/2018  . Hypokalemia 11/24/2018  . Chronic generalized pain 11/24/2018  . Chronic bronchitis ([place]) 10/14/2017  . DVT of popliteal vein ([place]) 10/05/2012  . Obsessive-compulsive disorder 08/03/2010  . Chronic constipation 07/13/2009  . Seizure disorder ([place]) 06/11/2009  . Hyperlipemia 06/09/2009  . Depression with anxiety 06/09/2009  . Essential hypertension 06/09/2009  CMP     Component Value Date/Time   NA 132 (L) 07/13/2019 0800   K 3.5 07/13/2019 0800   CL 93 (L) 07/13/2019 0800   CO2 27 07/13/2019 0800   GLUCOSE 99 07/13/2019 0800   BUN 15 07/13/2019 0800   CREATININE 0.66 07/13/2019 0800   CREATININE 0.61 11/08/2014 1038   CALCIUM 8.1 (L) 07/13/2019 0800   PROT 6.7 07/13/2019 0800   ALBUMIN 3.5 07/13/2019 0800   AST 27 07/13/2019 0800   ALT 17 07/13/2019 0800   ALKPHOS 37 (L) 07/13/2019 0800   BILITOT 0.5 07/13/2019 0800   GFRNONAA >60 07/13/2019 0800   GFRAA >60 07/13/2019 0800   Recent Labs    01/02/19 0700 07/13/19 0800  NA 142 132*  K 4.2 3.5  CL 105 93*  CO2 31 27  GLUCOSE 98 99  BUN 24* 15  CREATININE 0.72 0.66  CALCIUM 8.7* 8.1*   Recent Labs    07/13/19 0800  AST 27  ALT 17  ALKPHOS 37*  BILITOT 0.5  PROT 6.7  ALBUMIN 3.5   Recent Labs    01/02/19 0700 07/13/19 0800  WBC 5.3 3.0*  NEUTROABS 2.4  --   HGB 12.7 12.5  HCT 39.9 38.6  MCV 93.7 92.3  PLT 162 117*   Recent Labs    01/02/19 0700 07/13/19 0800  CHOL 145 126  LDLCALC 87 81  TRIG 56 41   No results  found for: MICROALBUR Lab Results  Component Value Date   TSH 1.590 06/19/2017   Lab Results  Component Value Date   HGBA1C 5.5 01/31/2018   Lab Results  Component Value Date   CHOL 126 07/13/2019   HDL 37 (L) 07/13/2019   LDLCALC 81 07/13/2019   TRIG 41 07/13/2019   CHOLHDL 3.4 07/13/2019    Significant Diagnostic Results in last 30 days:  No results found.  Assessment and Plan  Hyperlipemia LDL 81, HDL 37, relatively well controlled, especially for age; continue Mevacor 20 mg daily  Hypokalemia Recent potassium is 3.5; continue potassium chloride 10 mEq p.o. daily  Dementia with behavioral disturbance (HCC) Chronic and stable; continue Namenda 5 mg twice daily     Alejandra Duos, MD

## 2019-10-05 DIAGNOSIS — E669 Obesity, unspecified: Secondary | ICD-10-CM | POA: Insufficient documentation

## 2019-10-08 ENCOUNTER — Encounter: Payer: Self-pay | Admitting: Adult Health

## 2019-10-08 ENCOUNTER — Non-Acute Institutional Stay (SKILLED_NURSING_FACILITY): Payer: Medicare Other | Admitting: Adult Health

## 2019-10-08 ENCOUNTER — Encounter: Payer: Self-pay | Admitting: Internal Medicine

## 2019-10-08 DIAGNOSIS — F0391 Unspecified dementia with behavioral disturbance: Secondary | ICD-10-CM | POA: Insufficient documentation

## 2019-10-08 DIAGNOSIS — F418 Other specified anxiety disorders: Secondary | ICD-10-CM

## 2019-10-08 DIAGNOSIS — F03918 Unspecified dementia, unspecified severity, with other behavioral disturbance: Secondary | ICD-10-CM | POA: Insufficient documentation

## 2019-10-08 NOTE — Assessment & Plan Note (Signed)
Chronic and stable; continue Namenda 5 mg twice daily 

## 2019-10-08 NOTE — Progress Notes (Signed)
Location:    [facility] Room Number: 116/D Place of Service:  SNF (31)   CODE STATUS: Full Code  No Known Allergies  Chief Complaint  Patient presents with  . Acute Visit    Med Management    HPI:  She is presently taking prozac 30 mg daily for her depression. Her mood state has improved since returning to her room. There are no recent reports of paranoia. No reports of anxiety or agitation. No reports of insomnia.   Past Medical History:  Diagnosis Date  . Diverticulosis OCT 2010 TCS   SIGMOID COLON  . Esophageal web 2010   DILATION 16 MM  . Gastritis, Helicobacter pylori 9147   Rx. ABO for 10 days   . GERD (gastroesophageal reflux disease)   . Hip fracture, left ([place])    2016  . Hyperlipidemia   . Hypertension   . Restless legs syndrome   . Seizures [facility]) January 2010   Post head trauma in fall .sees [name]    Past Surgical History:  Procedure Laterality Date  . ABDOMINAL HYSTERECTOMY    . COLONOSCOPY  OCT 2010   TORTUOUS, Sml IH, Tics, MULTIPLE SIMPLE ADENOMAS (<6MM)  . HIP ARTHROPLASTY Left 11/19/2014   Procedure: LEFT PARTIAL HIP REPLACEMENT;  Surgeon: [name], MD;  Location: [facility];  Service: Orthopedics;  Laterality: Left;  . ORIF HIP FRACTURE  04/05/2012   Procedure: OPEN REDUCTION INTERNAL FIXATION HIP;  Surgeon: [name], MD;  Location: [facility];  Service: Orthopedics;  Laterality: Right;  . UPPER GASTROINTESTINAL ENDOSCOPY  AUG 2010   SAVARY  . VESICOVAGINAL FISTULA CLOSURE W/ TAH  1966   bleeding     Social History   Socioeconomic History  . Marital status: Widowed    Spouse name: Not on file  . Number of children: Not on file  . Years of education: Not on file  . Highest education level: Not on file  Occupational History  . 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 Smoking status: Never Smoker  . Smokeless tobacco: Never Used  Substance and Sexual Activity  .  Alcohol use: No  . Drug use: No  . Sexual activity: Never  Other Topics Concern  . Not on file  Social History Narrative  . Not on file   Social Determinants of Health   Financial Resource Strain:   . Difficulty of Paying Living Expenses: Not on file  Food Insecurity:   . Worried About Charity fundraiser in the Last Year: Not on file  . Ran Out of Food in the Last Year: Not on file  Transportation Needs:   . Lack of Transportation (Medical): Not on file  . Lack of Transportation (Non-Medical): Not on file  Physical Activity:   . Days of Exercise per Week: Not on file  . Minutes of Exercise per Session: Not on file  Stress:   . Feeling of Stress : Not on file  Social Connections:   . Frequency of Communication with Friends and Family: Not on file  . Frequency of Social Gatherings with Friends and Family: Not on file  . Attends Religious Services: Not on file  . Active Member of Clubs or Organizations: Not on file  . Attends Archivist Meetings: Not on file  . Marital Status: Not on file  Intimate Partner Violence:   . Fear of Current or Ex-Partner: Not on file  . Emotionally Abused:  Not on file  . Physically Abused: Not on file  . Sexually Abused: Not on file   Family History  Problem Relation Age of Onset  . Cancer Mother        bladder   . Diabetes Mother   . Diabetes Father   . Emphysema Father   . GER disease Brother   . Colon cancer Neg Hx   . Colon polyps Neg Hx       VITAL SIGNS BP 140/79   Pulse 73   Temp 99 F (37.2 C) (Oral)   Resp 18   Ht '5\' 7"'$  (1.702 m)   Wt 241 lb 12.8 oz (109.7 kg)   SpO2 95%   BMI 37.87 kg/m   Outpatient Encounter Medications as of 10/08/2019  Medication Sig  . acetaminophen (TYLENOL) 325 MG tablet Take 650 mg by mouth every 4 (four) hours as needed.  Marland Kitchen acetaminophen (TYLENOL) 650 MG CR tablet Take 1,300 mg by mouth daily.   Marland Kitchen aspirin 81 MG chewable tablet Chew 81 mg by mouth daily.  . calcium-vitamin D  (OSCAL WITH D) 500-200 MG-UNIT TABS TAKE (1) TABLET BY MOUTH (3) TIMES DAILY WITH MEALS.  Marland Kitchen docusate sodium (COLACE) 100 MG capsule Take 200 mg by mouth every morning.   Marland Kitchen FLUoxetine (PROZAC) 10 MG tablet Take 1 tablet 10 mg by mouth along with 20 mg to equal 30 mg once a day  . FLUoxetine (PROZAC) 20 MG tablet Take 1 tablet 20 mg along with 10 mg to equal 30 mg by mouth once a day  . Glycopyrrolate (LONHALA MAGNAIR STARTER KIT) 25 MCG/ML SOLN Inhale 25 mcg/mL into the lungs 2 (two) times daily.  . hydrochlorothiazide (HYDRODIURIL) 12.5 MG tablet Take 12.5 mg by mouth daily.  Marland Kitchen ipratropium-albuterol (DUONEB) 0.5-2.5 (3) MG/3ML SOLN Take 3 mLs by nebulization every 6 (six) hours as needed.  . levETIRAcetam (KEPPRA) 500 MG tablet TAKE 1 TABLET BY MOUTH TWICE A DAY AS DIRECTED.  Marland Kitchen lovastatin (MEVACOR) 20 MG tablet Take 1 tablet (20 mg total) by mouth at bedtime.  . Melatonin 5 MG TABS Take 5 mg by mouth at bedtime.  . memantine (NAMENDA) 5 MG tablet Take 5 mg by mouth daily.   Derrill Memo ON 10/09/2019] memantine (NAMENDA) 5 MG tablet Take 5 mg by mouth 2 (two) times daily.  . NON FORMULARY Diet type:  NAS  . polyethylene glycol (MIRALAX / GLYCOLAX) packet Take 17 g by mouth daily as needed.   . potassium chloride (K-DUR,KLOR-CON) 10 MEQ tablet Take 10 mEq by mouth daily.  Marland Kitchen senna (SENOKOT) 8.6 MG TABS tablet Take 2 tablets by mouth at bedtime.   . SYMBICORT 80-4.5 MCG/ACT inhaler Inhale 2 puffs into the lungs 2 (two) times daily.   . [DISCONTINUED] Glucerna (GLUCERNA) LIQD Take 237 mLs by mouth 2 (two) times daily between meals.  . [DISCONTINUED] Multiple Vitamin (MULTIVITAMIN) tablet Take 1 tablet by mouth daily.   No facility-administered encounter medications on file as of 10/08/2019.     SIGNIFICANT DIAGNOSTIC EXAMS   LABS REVIEWED PREVIOUS  09-25-18" wbc  5.1; hgb 13.2; hct 42.0; mcv 94.2; plt 177; glucose 97; bun 21; creat 0.66; k+ 4.0; na++ 141 ca 8.9 01-02-19: wbc 5.3 hgb 12.7; hct  39.9; mcv 93.7; plt 162; glucose 98; bun 24; creat 0.72; k+ 4.2; na++142 ca 8.7 chol 145 ldl 87; tri 56; hdl 47  07-13-19: wbc 3.0; hgb 12.5; hct 38.6; mcv 92.3 plt 117; glucose 99; bun 15; creat 0.66;  k+ 3.5; na++ 132; ca 8.1; liver normal albumin 35; chol 126; ldl 81; trig 41; hdl 37  NO NEW LABS.   Review of Systems  Constitutional: Negative for malaise/fatigue.  Respiratory: Negative for cough and shortness of breath.   Cardiovascular: Negative for chest pain, palpitations and leg swelling.  Gastrointestinal: Negative for abdominal pain, constipation and heartburn.  Musculoskeletal: Negative for back pain, joint pain and myalgias.  Skin: Negative.   Neurological: Negative for dizziness.  Psychiatric/Behavioral: The patient is not nervous/anxious.     Physical Exam Constitutional:      General: She is not in acute distress.    Appearance: She is well-developed. She is obese. She is not diaphoretic.  Neck:     Thyroid: No thyromegaly.  Cardiovascular:     Rate and Rhythm: Normal rate and regular rhythm.     Pulses: Normal pulses.     Heart sounds: Normal heart sounds.  Pulmonary:     Effort: Pulmonary effort is normal. No respiratory distress.     Breath sounds: Normal breath sounds.  Abdominal:     General: Bowel sounds are normal. There is no distension.     Palpations: Abdomen is soft.     Tenderness: There is no abdominal tenderness.  Musculoskeletal:     Cervical back: Neck supple.     Right lower leg: No edema.     Left lower leg: No edema.     Comments:  Is able to move all extremities Uses wheelchair  2016: left hip fracture left hip replacement         Lymphadenopathy:     Cervical: No cervical adenopathy.  Skin:    General: Skin is warm and dry.  Neurological:     Mental Status: She is alert. Mental status is at baseline.  Psychiatric:        Mood and Affect: Mood normal.       ASSESSMENT/ PLAN:  TODAY  1. Depression with anxiety: she is emotionally  stable will continue prozac 30 mg daily and will continue to monitor her status.     MD is aware of resident's narcotic use and is in agreement with current plan of care. We will attempt to wean resident as appropriate.  Ok Edwards NP [facility]  Contact [phone] Monday through Friday 8am- 5pm  After hours call [phone]

## 2019-10-08 NOTE — Assessment & Plan Note (Signed)
Recent potassium is 3.5; continue potassium chloride 10 mEq p.o. daily

## 2019-10-08 NOTE — Assessment & Plan Note (Signed)
LDL 81, HDL 37, relatively well controlled, especially for age; continue Mevacor 20 mg daily

## 2019-12-04 ENCOUNTER — Encounter: Payer: Self-pay | Admitting: Adult Health

## 2019-12-04 ENCOUNTER — Non-Acute Institutional Stay (SKILLED_NURSING_FACILITY): Payer: Medicare PPO | Admitting: Adult Health

## 2019-12-04 DIAGNOSIS — K5909 Other constipation: Secondary | ICD-10-CM | POA: Diagnosis not present

## 2019-12-04 DIAGNOSIS — E876 Hypokalemia: Secondary | ICD-10-CM | POA: Diagnosis not present

## 2019-12-04 DIAGNOSIS — J410 Simple chronic bronchitis: Secondary | ICD-10-CM | POA: Diagnosis not present

## 2019-12-04 NOTE — Progress Notes (Signed)
Location:    [facility] Room Number: 116D Place of Service:  SNF (31) Phillips Grout NP    CODE STATUS: FULL   No Known Allergies  Chief Complaint  Patient presents with  . Medical Management of Chronic Issues        Simple chronic bronchitis  Chronic constipation: Hypokalemia;    HPI:  She is a 86 year old long term resident of this facility being seen for the management of her chronic illnesses: bronchitis; constipation; hypokalemia. There are no reports of uncontrolled pain; no changes in her appetite; no reports of constipation.   Past Medical History:  Diagnosis Date  . Diverticulosis OCT 2010 TCS   SIGMOID COLON  . Esophageal web 2010   DILATION 16 MM  . Gastritis, Helicobacter pylori 0174   Rx. ABO for 10 days   . GERD (gastroesophageal reflux disease)   . Hip fracture, left ([place])    2016  . Hyperlipidemia   . Hypertension   . Restless legs syndrome   . Seizures [facility]) January 2010   Post head trauma in fall .sees [name]    Past Surgical History:  Procedure Laterality Date  . ABDOMINAL HYSTERECTOMY    . COLONOSCOPY  OCT 2010   TORTUOUS, Sml IH, Tics, MULTIPLE SIMPLE ADENOMAS (<6MM)  . HIP ARTHROPLASTY Left 11/19/2014   Procedure: LEFT PARTIAL HIP REPLACEMENT;  Surgeon: [name], MD;  Location: [facility];  Service: Orthopedics;  Laterality: Left;  . ORIF HIP FRACTURE  04/05/2012   Procedure: OPEN REDUCTION INTERNAL FIXATION HIP;  Surgeon: [name], MD;  Location: [facility];  Service: Orthopedics;  Laterality: Right;  . UPPER GASTROINTESTINAL ENDOSCOPY  AUG 2010   SAVARY  . VESICOVAGINAL FISTULA CLOSURE W/ TAH  1966   bleeding     Social History   Socioeconomic History  . Marital status: Widowed    Spouse name: Not on file  . Number of children: Not on file  . Years of education: Not on file  . Highest education level: Not on file  Occupational History  . 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 Smoking status: Never Smoker  . Smokeless tobacco: Never Used  Substance and Sexual Activity  . Alcohol use: No  . Drug use: No  . Sexual activity: Never  Other Topics Concern  . Not on file  Social History Narrative  . Not on file   Social Determinants of Health   Financial Resource Strain:   . Difficulty of Paying Living Expenses: Not on file  Food Insecurity:   . Worried About Charity fundraiser in the Last Year: Not on file  . Ran Out of Food in the Last Year: Not on file  Transportation Needs:   . Lack of Transportation (Medical): Not on file  . Lack of Transportation (Non-Medical): Not on file  Physical Activity:   . Days of Exercise per Week: Not on file  . Minutes of Exercise per Session: Not on file  Stress:   . Feeling of Stress : Not on file  Social Connections:   . Frequency of Communication with Friends and Family: Not on file  . Frequency of Social Gatherings with Friends and Family: Not on file  . Attends Religious Services: Not on file  . Active Member of Clubs or Organizations: Not on file  . Attends Archivist Meetings: Not on file  . Marital Status: Not on file  Intimate Partner Violence:   . Fear of Current or Ex-Partner: Not on file  . Emotionally Abused: Not on file  . Physically Abused: Not on file  . Sexually Abused: Not on file   Family History  Problem Relation Age of Onset  . Cancer Mother        bladder   . Diabetes Mother   . Diabetes Father   . Emphysema Father   . GER disease Brother   . Colon cancer Neg Hx   . Colon polyps Neg Hx       VITAL SIGNS BP (!) 145/86   Pulse 67   Temp 98 F (36.7 C) (Oral)   Resp 18   Ht '5\' 7"'$  (1.702 m)   Wt 241 lb (109.3 kg)   SpO2 94%   BMI 37.75 kg/m   Outpatient Encounter Medications as of 12/04/2019  Medication Sig  . acetaminophen (TYLENOL) 325 MG tablet Take 650 mg by mouth every 4 (four) hours as needed.  Marland Kitchen acetaminophen (TYLENOL) 650 MG CR tablet Take  1,300 mg by mouth daily.   Marland Kitchen aspirin 81 MG chewable tablet Chew 81 mg by mouth daily.  . calcium-vitamin D (OSCAL WITH D) 500-200 MG-UNIT TABS TAKE (1) TABLET BY MOUTH (3) TIMES DAILY WITH MEALS.  Marland Kitchen docusate sodium (COLACE) 100 MG capsule Take 200 mg by mouth every morning.   Marland Kitchen FLUoxetine (PROZAC) 10 MG tablet Take 1 tablet 10 mg by mouth along with 20 mg to equal 30 mg once a day  . FLUoxetine (PROZAC) 20 MG tablet Take 1 tablet 20 mg along with 10 mg to equal 30 mg by mouth once a day  . hydrochlorothiazide (HYDRODIURIL) 12.5 MG tablet Take 12.5 mg by mouth daily.  Marland Kitchen ipratropium-albuterol (DUONEB) 0.5-2.5 (3) MG/3ML SOLN Take 3 mLs by nebulization every 6 (six) hours as needed.  . levETIRAcetam (KEPPRA) 500 MG tablet TAKE 1 TABLET BY MOUTH TWICE A DAY AS DIRECTED.  Marland Kitchen lovastatin (MEVACOR) 20 MG tablet Take 1 tablet (20 mg total) by mouth at bedtime.  . Melatonin 5 MG TABS Take 5 mg by mouth at bedtime.  . memantine (NAMENDA) 5 MG tablet Take 5 mg by mouth 2 (two) times daily.  . memantine (NAMENDA) 5 MG tablet Take 5 mg by mouth 2 (two) times daily.  . NON FORMULARY Diet type:  NAS  . polyethylene glycol (MIRALAX / GLYCOLAX) packet Take 17 g by mouth daily as needed.   . potassium chloride (K-DUR,KLOR-CON) 10 MEQ tablet Take 10 mEq by mouth daily.  Marland Kitchen senna (SENOKOT) 8.6 MG TABS tablet Take 2 tablets by mouth daily.   . SYMBICORT 80-4.5 MCG/ACT inhaler Inhale 2 puffs into the lungs 2 (two) times daily.   . [DISCONTINUED] Glycopyrrolate (LONHALA MAGNAIR STARTER KIT) 25 MCG/ML SOLN Inhale 25 mcg/mL into the lungs 2 (two) times daily.  . [DISCONTINUED] memantine (NAMENDA) 5 MG tablet Take 5 mg by mouth daily.    No facility-administered encounter medications on file as of 12/04/2019.     SIGNIFICANT DIAGNOSTIC EXAMS  TODAY  09-07-19: DEXA: t score -1.06  LABS REVIEWED PREVIOUS  01-02-19: wbc 5.3 hgb 12.7; hct 39.9; mcv 93.7; plt 162; glucose 98; bun 24; creat 0.72; k+ 4.2; na++142 ca 8.7  chol 145 ldl 87; tri 56; hdl 47  07-13-19: wbc 3.0; hgb 12.5; hct 38.6; mcv 92.3 plt 117; glucose 99; bun 15; creat 0.66; k+ 3.5; na++ 132; ca 8.1; liver normal albumin 35; chol 126; ldl 81; trig 41;  hdl 37  NO NEW LABS.    Review of Systems  Constitutional: Negative for malaise/fatigue.  Respiratory: Negative for cough and shortness of breath.   Cardiovascular: Negative for chest pain, palpitations and leg swelling.  Gastrointestinal: Negative for abdominal pain, constipation and heartburn.  Musculoskeletal: Negative for back pain, joint pain and myalgias.  Skin: Negative.   Neurological: Negative for dizziness.  Psychiatric/Behavioral: The patient is not nervous/anxious.     Physical Exam Constitutional:      General: She is not in acute distress.    Appearance: She is well-developed. She is obese. She is not diaphoretic.  Neck:     Thyroid: No thyromegaly.  Cardiovascular:     Rate and Rhythm: Normal rate and regular rhythm.     Pulses: Normal pulses.     Heart sounds: Normal heart sounds.  Pulmonary:     Effort: Pulmonary effort is normal. No respiratory distress.     Breath sounds: Normal breath sounds.  Abdominal:     General: Bowel sounds are normal. There is no distension.     Palpations: Abdomen is soft.     Tenderness: There is no abdominal tenderness.  Musculoskeletal:     Cervical back: Neck supple.     Right lower leg: No edema.     Left lower leg: No edema.     Comments:  Is able to move all extremities Uses wheelchair  2016: left hip fracture left hip replacement          Lymphadenopathy:     Cervical: No cervical adenopathy.  Skin:    General: Skin is warm and dry.  Neurological:     Mental Status: She is alert. Mental status is at baseline.  Psychiatric:        Mood and Affect: Mood normal.      ASSESSMENT/ PLAN:  TODAY:   1. Simple chronic bronchitis is stable will continue symbicort 80/4.5 mcg 2 puffs twice daily  will monitor  2. Chronic  constipation: is stable will continue colace 200 mg daily senna 2 tabs daily miralax daily as needed  3. Hypokalemia; is stable k+ 3.5 will continue k+ 10 meq daily   PREVIOUS  4. Unspecified hyperlipidemia: is stable LDL 81 will continue mevacor 20 mg daily   5. Unspecified obsessive compulsive disorder: is stable will continue prozac 30 mg daily and melatonin 5 mg daily   6. Chronic generalized pain is stable will continue tylenol 1300 mg daily   7. Seizure disorder: is stable no reports of seizure activity will continue keppra 500 mg twice daily   8. Chronic deep vein thrombosis (DVT) popliteal vein unspecified laterality: is stable will monitor   9. Essential hypertension: is stable b/p 145/86  will continue hctz 12.5 mg daily   10. Dementia without behavioral disturbance: is without change: will continue namenda 5 mg twice daily       MD is aware of resident's narcotic use and is in agreement with current plan of care. We will attempt to wean resident as appropriate.  Ok Edwards NP [facility]  Contact [phone] Monday through Friday 8am- 5pm  After hours call [phone]

## 2019-12-16 ENCOUNTER — Non-Acute Institutional Stay: Payer: Self-pay | Admitting: Adult Health

## 2019-12-16 ENCOUNTER — Encounter: Payer: Self-pay | Admitting: Adult Health

## 2019-12-16 DIAGNOSIS — F429 Obsessive-compulsive disorder, unspecified: Secondary | ICD-10-CM | POA: Diagnosis not present

## 2019-12-16 DIAGNOSIS — F418 Other specified anxiety disorders: Secondary | ICD-10-CM | POA: Diagnosis not present

## 2019-12-16 NOTE — Progress Notes (Signed)
Location:    [facility] Room Number: 116/D Place of Service:  SNF (31)   CODE STATUS: Full Code  No Known Allergies  Chief Complaint  Patient presents with  . Medication Management    Medication Review     HPI:  She has been on prozac 30 mg daily for the past several years. She has depression with anxiety and obsessive compulsive disorder. There are no repots of anxiety; agitation; or depressive thoughts. She will need a gradual dose reduction.   Past Medical History:  Diagnosis Date  . Diverticulosis OCT 2010 TCS   SIGMOID COLON  . Esophageal web 2010   DILATION 16 MM  . Gastritis, Helicobacter pylori 0017   Rx. ABO for 10 days   . GERD (gastroesophageal reflux disease)   . Hip fracture, left ([place])    2016  . Hyperlipidemia   . Hypertension   . Restless legs syndrome   . Seizures [facility]) January 2010   Post head trauma in fall .sees [name]    Past Surgical History:  Procedure Laterality Date  . ABDOMINAL HYSTERECTOMY    . COLONOSCOPY  OCT 2010   TORTUOUS, Sml IH, Tics, MULTIPLE SIMPLE ADENOMAS (<6MM)  . HIP ARTHROPLASTY Left 11/19/2014   Procedure: LEFT PARTIAL HIP REPLACEMENT;  Surgeon: [name], MD;  Location: [facility];  Service: Orthopedics;  Laterality: Left;  . ORIF HIP FRACTURE  04/05/2012   Procedure: OPEN REDUCTION INTERNAL FIXATION HIP;  Surgeon: [name], MD;  Location: [facility];  Service: Orthopedics;  Laterality: Right;  . UPPER GASTROINTESTINAL ENDOSCOPY  AUG 2010   SAVARY  . VESICOVAGINAL FISTULA CLOSURE W/ TAH  1966   bleeding     Social History   Socioeconomic History  . Marital status: Widowed    Spouse name: Not on file  . Number of children: Not on file  . Years of education: Not on file  . Highest education level: Not on file  Occupational History  . 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 Smoking status: Never Smoker  . Smokeless tobacco: Never Used    Substance and Sexual Activity  . Alcohol use: No  . Drug use: No  . Sexual activity: Never  Other Topics Concern  . Not on file  Social History Narrative  . Not on file   Social Determinants of Health   Financial Resource Strain:   . Difficulty of Paying Living Expenses: Not on file  Food Insecurity:   . Worried About Charity fundraiser in the Last Year: Not on file  . Ran Out of Food in the Last Year: Not on file  Transportation Needs:   . Lack of Transportation (Medical): Not on file  . Lack of Transportation (Non-Medical): Not on file  Physical Activity:   . Days of Exercise per Week: Not on file  . Minutes of Exercise per Session: Not on file  Stress:   . Feeling of Stress : Not on file  Social Connections:   . Frequency of Communication with Friends and Family: Not on file  . Frequency of Social Gatherings with Friends and Family: Not on file  . Attends Religious Services: Not on file  . Active Member of Clubs or Organizations: Not on file  . Attends Archivist Meetings: Not on file  . Marital Status: Not on file  Intimate Partner Violence:   . Fear of Current or Ex-Partner: Not on file  .  Emotionally Abused: Not on file  . Physically Abused: Not on file  . Sexually Abused: Not on file   Family History  Problem Relation Age of Onset  . Cancer Mother        bladder   . Diabetes Mother   . Diabetes Father   . Emphysema Father   . GER disease Brother   . Colon cancer Neg Hx   . Colon polyps Neg Hx       VITAL SIGNS BP 132/76   Pulse 64   Temp 98.6 F (37 C) (Oral)   Resp 18   Ht 5\' 7"  (1.702 m)   Wt 243 lb 3.2 oz (110.3 kg)   SpO2 94%   BMI 38.09 kg/m   Outpatient Encounter Medications as of 12/16/2019  Medication Sig  . acetaminophen (TYLENOL) 325 MG tablet Take 650 mg by mouth every 4 (four) hours as needed.  02/15/2020 acetaminophen (TYLENOL) 650 MG CR tablet Take 1,300 mg by mouth daily.   Marland Kitchen aspirin 81 MG chewable tablet Chew 81 mg by mouth  daily.  . calcium-vitamin D (OSCAL WITH D) 500-200 MG-UNIT TABS TAKE (1) TABLET BY MOUTH (3) TIMES DAILY WITH MEALS.  Marland Kitchen docusate sodium (COLACE) 100 MG capsule Take 200 mg by mouth every morning.   Marland Kitchen FLUoxetine (PROZAC) 10 MG tablet Take 1 tablet 10 mg by mouth along with 20 mg to equal 30 mg once a day  . FLUoxetine (PROZAC) 20 MG tablet Take 1 tablet 20 mg along with 10 mg to equal 30 mg by mouth once a day  . hydrochlorothiazide (HYDRODIURIL) 12.5 MG tablet Take 12.5 mg by mouth daily.  Marland Kitchen ipratropium-albuterol (DUONEB) 0.5-2.5 (3) MG/3ML SOLN Take 3 mLs by nebulization every 6 (six) hours as needed.  . levETIRAcetam (KEPPRA) 500 MG tablet TAKE 1 TABLET BY MOUTH TWICE A DAY AS DIRECTED.  Marland Kitchen lovastatin (MEVACOR) 20 MG tablet Take 1 tablet (20 mg total) by mouth at bedtime.  . Melatonin 5 MG TABS Take 5 mg by mouth at bedtime.  . memantine (NAMENDA) 5 MG tablet Take 5 mg by mouth 2 (two) times daily.  . memantine (NAMENDA) 5 MG tablet Take 5 mg by mouth 2 (two) times daily.  . NON FORMULARY Diet type:  NAS  . polyethylene glycol (MIRALAX / GLYCOLAX) packet Take 17 g by mouth daily as needed.   . potassium chloride (K-DUR,KLOR-CON) 10 MEQ tablet Take 10 mEq by mouth daily.  Marland Kitchen senna (SENOKOT) 8.6 MG TABS tablet Take 2 tablets by mouth daily.   . SYMBICORT 80-4.5 MCG/ACT inhaler Inhale 2 puffs into the lungs 2 (two) times daily.    No facility-administered encounter medications on file as of 12/16/2019.     SIGNIFICANT DIAGNOSTIC EXAMS   PREVIOUS  09-07-19: DEXA: t score -1.06  NO NEW EXAMS  LABS REVIEWED PREVIOUS  01-02-19: wbc 5.3 hgb 12.7; hct 39.9; mcv 93.7; plt 162; glucose 98; bun 24; creat 0.72; k+ 4.2; na++142 ca 8.7 chol 145 ldl 87; tri 56; hdl 47  07-13-19: wbc 3.0; hgb 12.5; hct 38.6; mcv 92.3 plt 117; glucose 99; bun 15; creat 0.66; k+ 3.5; na++ 132; ca 8.1; liver normal albumin 35; chol 126; ldl 81; trig 41; hdl 37  NO NEW LABS.   Review of Systems  Constitutional:  Negative for malaise/fatigue.  Respiratory: Negative for cough and shortness of breath.   Cardiovascular: Negative for chest pain, palpitations and leg swelling.  Gastrointestinal: Negative for abdominal pain, constipation and heartburn.  Musculoskeletal: Negative for back pain, joint pain and myalgias.  Skin: Negative.   Neurological: Negative for dizziness.  Psychiatric/Behavioral: The patient is not nervous/anxious.    Physical Exam Constitutional:      General: She is not in acute distress.    Appearance: She is well-developed. She is obese. She is not diaphoretic.  Neck:     Thyroid: No thyromegaly.  Cardiovascular:     Rate and Rhythm: Normal rate and regular rhythm.     Pulses: Normal pulses.     Heart sounds: Normal heart sounds.  Pulmonary:     Effort: Pulmonary effort is normal. No respiratory distress.     Breath sounds: Normal breath sounds.  Abdominal:     General: Bowel sounds are normal. There is no distension.     Palpations: Abdomen is soft.     Tenderness: There is no abdominal tenderness.  Musculoskeletal:     Cervical back: Neck supple.     Right lower leg: No edema.     Left lower leg: No edema.     Comments: Is able to move all extremities Uses wheelchair  2016: left hip fracture left hip replacement           Lymphadenopathy:     Cervical: No cervical adenopathy.  Skin:    General: Skin is warm and dry.  Neurological:     Mental Status: She is alert. Mental status is at baseline.  Psychiatric:        Mood and Affect: Mood normal.       ASSESSMENT/ PLAN:  TODAY  1. Depression with anxiety 2. Obsessive compulsive disorder  Will lower her prozac to 20 mg daily  Will continue to monitor her status.     MD is aware of resident's narcotic use and is in agreement with current plan of care. We will attempt to wean resident as appropriate.  Synthia Innocent NP [facility]  Contact [phone] Monday through Friday 8am- 5pm  After  hours call [phone]

## 2019-12-30 ENCOUNTER — Non-Acute Institutional Stay (SKILLED_NURSING_FACILITY): Payer: Medicare PPO | Admitting: Internal Medicine

## 2019-12-30 ENCOUNTER — Encounter: Payer: Self-pay | Admitting: Internal Medicine

## 2019-12-30 DIAGNOSIS — G40909 Epilepsy, unspecified, not intractable, without status epilepticus: Secondary | ICD-10-CM | POA: Diagnosis not present

## 2019-12-30 DIAGNOSIS — I10 Essential (primary) hypertension: Secondary | ICD-10-CM

## 2019-12-30 DIAGNOSIS — F418 Other specified anxiety disorders: Secondary | ICD-10-CM

## 2019-12-30 NOTE — Progress Notes (Signed)
Location:  [facility] Room Number: 116-D Place of Service:  SNF (31)  Margit Hanks, MD  Patient Care Team: Margit Hanks, MD as PCP - General (Internal Medicine) [place], MD (Gastroenterology) Chilton Si Chong Sicilian, NP as Nurse Practitioner (Geriatric Medicine) Center, [facility] (Skilled Nursing Facility)  Extended Emergency Contact Information Primary Emergency Contact: [facility] Address: [address]          [name], [place] [address] [place] of [place] Home Phone: [phone] Mobile Phone: [phone] Relation: Daughter Secondary Emergency Contact: Vivien Presto [place] of [place] Mobile Phone: [phone] Relation: Son    Allergies: Patient has no known allergies.  Chief Complaint  Patient presents with  . Medical Management of Chronic Issues    Routine [facility] visit    HPI: Patient is an 86 y.o. female who is being seen for routine issues of hypertension, seizure disorder, and depression and anxiety.  Past Medical History:  Diagnosis Date  . Diverticulosis OCT 2010 TCS   SIGMOID COLON  . Esophageal web 2010   DILATION 16 MM  . Gastritis, Helicobacter pylori 2010   Rx. ABO for 10 days   . GERD (gastroesophageal reflux disease)   . Hip fracture, left ([facility])    2016  . Hyperlipidemia   . Hypertension   . Restless legs syndrome   . Seizures [facility]) January 2010   Post head trauma in fall .sees Dr Gerilyn Pilgrim    Past Surgical History:  Procedure Laterality Date  . ABDOMINAL HYSTERECTOMY    . COLONOSCOPY  OCT 2010   TORTUOUS, Sml IH, Tics, MULTIPLE SIMPLE ADENOMAS (<6MM)  . HIP ARTHROPLASTY Left 11/19/2014   Procedure: LEFT PARTIAL HIP REPLACEMENT;  Surgeon: Vickki Hearing, MD;  Location: [facility];  Service: Orthopedics;  Laterality: Left;  . ORIF HIP FRACTURE  04/05/2012   Procedure: OPEN REDUCTION INTERNAL FIXATION HIP;  Surgeon: Vickki Hearing, MD;  Location: [facility];  Service:  Orthopedics;  Laterality: Right;  . UPPER GASTROINTESTINAL ENDOSCOPY  AUG 2010   SAVARY  . VESICOVAGINAL FISTULA CLOSURE W/ TAH  1966   bleeding     Allergies as of 12/30/2019   No Known Allergies     Medication List    Notice   This visit is during an admission. Changes to the med list made in this visit will be reflected in the After Visit Summary of the admission.    Current Outpatient Medications on File Prior to Visit  Medication Sig Dispense Refill  . acetaminophen (TYLENOL) 325 MG tablet Take 650 mg by mouth every 4 (four) hours as needed.    Marland Kitchen acetaminophen (TYLENOL) 650 MG CR tablet Take 1,300 mg by mouth daily.     Marland Kitchen aspirin 81 MG chewable tablet Chew 81 mg by mouth daily.    . calcium-vitamin D (OSCAL WITH D) 500-200 MG-UNIT TABS TAKE (1) TABLET BY MOUTH (3) TIMES DAILY WITH MEALS. 90 each 3  . docusate sodium (COLACE) 100 MG capsule Take 200 mg by mouth every morning.     Marland Kitchen FLUoxetine (PROZAC) 10 MG tablet Take 1 tablet 10 mg by mouth along with 20 mg to equal 30 mg once a day    . FLUoxetine (PROZAC) 20 MG tablet Take 1 tablet 20 mg along with 10 mg to equal 30 mg by mouth once a day    . hydrochlorothiazide (HYDRODIURIL) 12.5 MG tablet Take 12.5 mg by mouth daily.    Marland Kitchen ipratropium-albuterol (DUONEB)  0.5-2.5 (3) MG/3ML SOLN Take 3 mLs by nebulization every 6 (six) hours as needed.    . levETIRAcetam (KEPPRA) 500 MG tablet TAKE 1 TABLET BY MOUTH TWICE A DAY AS DIRECTED. 60 tablet 5  . lovastatin (MEVACOR) 20 MG tablet Take 1 tablet (20 mg total) by mouth at bedtime. 30 tablet 5  . Melatonin 5 MG TABS Take 5 mg by mouth at bedtime.    . memantine (NAMENDA) 5 MG tablet Take 5 mg by mouth 2 (two) times daily.    . NON FORMULARY Diet type:  NAS    . polyethylene glycol (MIRALAX / GLYCOLAX) packet Take 17 g by mouth daily as needed.     . potassium chloride (K-DUR,KLOR-CON) 10 MEQ tablet Take 10 mEq by mouth daily.    Marland Kitchen senna (SENOKOT) 8.6 MG TABS tablet Take 2 tablets by  mouth daily.     . SYMBICORT 80-4.5 MCG/ACT inhaler Inhale 2 puffs into the lungs 2 (two) times daily.      No current facility-administered medications on file prior to visit.     No orders of the defined types were placed in this encounter.   Immunization History  Administered Date(s) Administered  . Influenza Split 07/25/2012  . Influenza,inj,Quad PF,6+ Mos 07/30/2013, 08/18/2014  . Influenza-Unspecified 07/12/2017, 07/11/2018, 07/13/2019  . Moderna SARS-COVID-2 Vaccination 10/14/2019, 11/11/2019  . PPD Test 12/06/2014  . Pneumococcal Conjugate-13 04/29/2014  . Pneumococcal Polysaccharide-23 07/25/2012  . Pneumococcal-Unspecified 08/01/2016  . Tdap 08/08/2011, 06/20/2017    Social History   Tobacco Use  . Smoking status: Never Smoker  . Smokeless tobacco: Never Used  Substance Use Topics  . Alcohol use: No    Review of Systems  GENERAL:  no fevers, fatigue, appetite changes SKIN: No itching, rash HEENT: No complaint RESPIRATORY: No cough, wheezing, SOB CARDIAC: No chest pain, palpitations, lower extremity edema  GI: No abdominal pain, No N/V/D or constipation, No heartburn or reflux  GU: No dysuria, frequency or urgency, or incontinence  MUSCULOSKELETAL: No unrelieved bone/joint pain NEUROLOGIC: No headache, dizziness  PSYCHIATRIC: No overt anxiety or sadness  Vitals:   12/30/19 1215  BP: 127/71  Pulse: 60  Resp: 18  Temp: 98.1 F (36.7 C)  SpO2: 94%   Body mass index is 38.09 kg/m. Physical Exam  GENERAL APPEARANCE: Alert, conversant, No acute distress  SKIN: No diaphoresis rash HEENT: Unremarkable RESPIRATORY: Breathing is even, unlabored. Lung sounds are clear   CARDIOVASCULAR: Heart RRR no murmurs, rubs or gallops. No peripheral edema  GASTROINTESTINAL: Abdomen is soft, non-tender, not distended w/ normal bowel sounds.  GENITOURINARY: Bladder non tender, not distended  MUSCULOSKELETAL: No abnormal joints or musculature NEUROLOGIC: Cranial  nerves 2-12 grossly intact. Moves all extremities PSYCHIATRIC: Mood and affect appropriate to situation, no behavioral issues  Patient Active Problem List   Diagnosis Date Noted  . Dementia with behavioral disturbance ([facility]) 10/08/2019  . Obesity (BMI 30-39.9) 10/05/2019  . Coronavirus infection 07/12/2019  . Osteoporosis 12/31/2018  . Hypokalemia 11/24/2018  . Chronic generalized pain 11/24/2018  . Chronic bronchitis ([facility]) 10/14/2017  . DVT of popliteal vein ([facility]) 10/05/2012  . Obsessive-compulsive disorder 08/03/2010  . Chronic constipation 07/13/2009  . Seizure disorder ([facility]) 06/11/2009  . Hyperlipemia 06/09/2009  . Depression with anxiety 06/09/2009  . Essential hypertension 06/09/2009    CMP     Component Value Date/Time   NA 132 (L) 07/13/2019 0800   K 3.5 07/13/2019 0800   CL 93 (L) 07/13/2019 0800   CO2 27 07/13/2019 0800  GLUCOSE 99 07/13/2019 0800   BUN 15 07/13/2019 0800   CREATININE 0.66 07/13/2019 0800   CREATININE 0.61 11/08/2014 1038   CALCIUM 8.1 (L) 07/13/2019 0800   PROT 6.7 07/13/2019 0800   ALBUMIN 3.5 07/13/2019 0800   AST 27 07/13/2019 0800   ALT 17 07/13/2019 0800   ALKPHOS 37 (L) 07/13/2019 0800   BILITOT 0.5 07/13/2019 0800   GFRNONAA >60 07/13/2019 0800   GFRAA >60 07/13/2019 0800   Recent Labs    07/13/19 0800  NA 132*  K 3.5  CL 93*  CO2 27  GLUCOSE 99  BUN 15  CREATININE 0.66  CALCIUM 8.1*   Recent Labs    07/13/19 0800  AST 27  ALT 17  ALKPHOS 37*  BILITOT 0.5  PROT 6.7  ALBUMIN 3.5   Recent Labs    07/13/19 0800  WBC 3.0*  HGB 12.5  HCT 38.6  MCV 92.3  PLT 117*   Recent Labs    07/13/19 0800  CHOL 126  LDLCALC 81  TRIG 41   No results found for: [facility] Lab Results  Component Value Date   TSH 1.590 06/19/2017   Lab Results  Component Value Date   HGBA1C 5.5 01/31/2018   Lab Results  Component Value Date   CHOL 126 07/13/2019   HDL 37 (L) 07/13/2019   LDLCALC 81 07/13/2019   TRIG 41  07/13/2019   CHOLHDL 3.4 07/13/2019    Significant Diagnostic Results in last 30 days:  No results found.  Assessment and Plan  Essential hypertension Controlled on HCTZ 12.5 mg daily; continue current regimen  Seizure disorder ([place]) No reported seizures; continue Keppra 500 mg twice daily  Depression with anxiety Patient will follow; continue Prozac 30 mg daily    Hennie Duos, MD

## 2020-01-03 ENCOUNTER — Encounter: Payer: Self-pay | Admitting: Internal Medicine

## 2020-01-03 NOTE — Assessment & Plan Note (Signed)
Controlled on HCTZ 12.5 mg daily; continue current regimen

## 2020-01-03 NOTE — Assessment & Plan Note (Signed)
Patient will follow; continue Prozac 30 mg daily

## 2020-01-03 NOTE — Assessment & Plan Note (Signed)
No reported seizures; continue Keppra 500 mg twice daily 

## 2020-01-22 ENCOUNTER — Non-Acute Institutional Stay (SKILLED_NURSING_FACILITY): Payer: Medicare PPO | Admitting: Adult Health

## 2020-01-22 ENCOUNTER — Encounter: Payer: Self-pay | Admitting: Adult Health

## 2020-01-22 DIAGNOSIS — Z0000 Encounter for general adult medical examination without abnormal findings: Secondary | ICD-10-CM

## 2020-01-22 NOTE — Progress Notes (Signed)
Subjective:   Alejandra Cruz is a 86 y.o. female who presents for Medicare Annual (Subsequent) preventive examination. Long term resident of [facility]  Review of Systems:  Review of Systems  Constitutional: Negative for malaise/fatigue.  Respiratory: Negative for cough and shortness of breath.   Cardiovascular: Negative for chest pain, palpitations and leg swelling.  Gastrointestinal: Negative for abdominal pain, constipation and heartburn.  Musculoskeletal: Negative for back pain, joint pain and myalgias.  Skin: Negative.   Neurological: Negative for dizziness.  Psychiatric/Behavioral: The patient is not nervous/anxious.     Cardiac Risk Factors include: advanced age (>47men, >51 women);hypertension;obesity (BMI >30kg/m2);sedentary lifestyle     Objective:     Vitals: BP 138/74   Pulse (!) 53   Temp 99 F (37.2 C) (Oral)   Resp 20   Ht 5\' 7"  (1.702 m)   Wt 244 lb 3.2 oz (110.8 kg)   SpO2 94%   BMI 38.25 kg/m   Body mass index is 38.25 kg/m.  Advanced Directives 01/22/2020 12/16/2019 12/04/2019 10/08/2019 09/30/2019 09/01/2019 08/05/2019  Does Patient Have a Medical Advance Directive? Yes Yes Yes Yes - Yes Yes  Type of Advance Directive (No Data) (No Data) (No Data) (No Data) (No Data) (No Data) (No Data)  Does patient want to make changes to medical advance directive? No - Patient declined No - Patient declined No - Patient declined No - Patient declined No - Patient declined No - Patient declined -  Copy of Healthcare Power of Attorney in Chart? - - - - - - -  Would patient like information on creating a medical advance directive? - - - - - - -  Pre-existing out of facility DNR order (yellow form or pink MOST form) - - - - - - -    Tobacco Social History   Tobacco Use  Smoking Status Never Smoker  Smokeless Tobacco Never Used     Counseling given: Not Answered   Clinical Intake:  Pre-visit preparation completed: Yes  Pain : No/denies pain     BMI - recorded:  38.25 Nutritional Status: BMI > 30  Obese Diabetes: No  How often do you need to have someone help you when you read instructions, pamphlets, or other written materials from your doctor or pharmacy?: 5 - Always  Interpreter Needed?: No     Past Medical History:  Diagnosis Date  . Diverticulosis OCT 2010 TCS   SIGMOID COLON  . Esophageal web 2010   DILATION 16 MM  . Gastritis, Helicobacter pylori 2010   Rx. ABO for 10 days   . GERD (gastroesophageal reflux disease)   . Hip fracture, left (HCC)    2016  . Hyperlipidemia   . Hypertension   . Restless legs syndrome   . Seizures [facility]) January 2010   Post head trauma in fall .sees Dr February 2010   Past Surgical History:  Procedure Laterality Date  . ABDOMINAL HYSTERECTOMY    . COLONOSCOPY  OCT 2010   TORTUOUS, Sml IH, Tics, MULTIPLE SIMPLE ADENOMAS (<6MM)  . HIP ARTHROPLASTY Left 11/19/2014   Procedure: LEFT PARTIAL HIP REPLACEMENT;  Surgeon: 01/18/2015, MD;  Location: [facility];  Service: Orthopedics;  Laterality: Left;  . ORIF HIP FRACTURE  04/05/2012   Procedure: OPEN REDUCTION INTERNAL FIXATION HIP;  Surgeon: 04/07/2012, MD;  Location: [facility];  Service: Orthopedics;  Laterality: Right;  . UPPER GASTROINTESTINAL ENDOSCOPY  AUG 2010   SAVARY  . VESICOVAGINAL FISTULA CLOSURE W/ TAH  1966  bleeding    Family History  Problem Relation Age of Onset  . Cancer Mother        bladder   . Diabetes Mother   . Diabetes Father   . Emphysema Father   . GER disease Brother   . Colon cancer Neg Hx   . Colon polyps Neg Hx    Social History   Socioeconomic History  . Marital status: Widowed    Spouse name: Not on file  . Number of children: Not on file  . Years of education: Not on file  . Highest education level: Not on file  Occupational History  . 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 agent   Tobacco Use  . Smoking status: Never Smoker  . Smokeless tobacco: Never Used  Substance and Sexual Activity   . Alcohol use: No  . Drug use: No  . Sexual activity: Never  Other Topics Concern  . Not on file  Social History Narrative  . Not on file   Social Determinants of Health   Financial Resource Strain:   . Difficulty of Paying Living Expenses:   Food Insecurity:   . Worried About Programme researcher, broadcasting/film/video in the Last Year:   . Barista in the Last Year:   Transportation Needs:   . Freight forwarder (Medical):   Marland Kitchen Lack of Transportation (Non-Medical):   Physical Activity:   . Days of Exercise per Week:   . Minutes of Exercise per Session:   Stress:   . Feeling of Stress :   Social Connections:   . Frequency of Communication with Friends and Family:   . Frequency of Social Gatherings with Friends and Family:   . Attends Religious Services:   . Active Member of Clubs or Organizations:   . Attends Banker Meetings:   Marland Kitchen Marital Status:     Outpatient Encounter Medications as of 01/22/2020  Medication Sig  . acetaminophen (TYLENOL) 325 MG tablet Take 650 mg by mouth every 4 (four) hours as needed.  Marland Kitchen acetaminophen (TYLENOL) 650 MG CR tablet Take 1,300 mg by mouth daily.   Marland Kitchen aspirin 81 MG chewable tablet Chew 81 mg by mouth daily.  . calcium-vitamin D (OSCAL WITH D) 500-200 MG-UNIT TABS TAKE (1) TABLET BY MOUTH (3) TIMES DAILY WITH MEALS.  Marland Kitchen docusate sodium (COLACE) 100 MG capsule Take 200 mg by mouth every morning.   Marland Kitchen FLUoxetine (PROZAC) 10 MG tablet Take 1 tablet 10 mg by mouth along with 20 mg to equal 30 mg once a day  . FLUoxetine (PROZAC) 20 MG tablet Take 1 tablet 20 mg along with 10 mg to equal 30 mg by mouth once a day  . hydrochlorothiazide (HYDRODIURIL) 12.5 MG tablet Take 12.5 mg by mouth daily.  Marland Kitchen ipratropium-albuterol (DUONEB) 0.5-2.5 (3) MG/3ML SOLN Take 3 mLs by nebulization every 6 (six) hours as needed.  . levETIRAcetam (KEPPRA) 500 MG tablet TAKE 1 TABLET BY MOUTH TWICE A DAY AS DIRECTED.  Marland Kitchen lovastatin (MEVACOR) 20 MG tablet Take 1 tablet (20  mg total) by mouth at bedtime.  . Melatonin 5 MG TABS Take 5 mg by mouth at bedtime.  . memantine (NAMENDA) 5 MG tablet Take 5 mg by mouth 2 (two) times daily.  . NON FORMULARY Diet type:  NAS  . polyethylene glycol (MIRALAX / GLYCOLAX) packet Take 17 g by mouth daily as needed.   . potassium chloride (K-DUR,KLOR-CON) 10 MEQ tablet Take 10 mEq by mouth daily.  Marland Kitchen  senna (SENOKOT) 8.6 MG TABS tablet Take 2 tablets by mouth daily.   . SYMBICORT 80-4.5 MCG/ACT inhaler Inhale 2 puffs into the lungs 2 (two) times daily.    No facility-administered encounter medications on file as of 01/22/2020.    Activities of Daily Living In your present state of health, do you have any difficulty performing the following activities: 01/22/2020  Hearing? N  Vision? N  Difficulty concentrating or making decisions? Y  Walking or climbing stairs? Y  Dressing or bathing? Y  Doing errands, shopping? Y  Preparing Food and eating ? Y  Using the Toilet? Y  In the past six months, have you accidently leaked urine? Y  Do you have problems with loss of bowel control? Y  Managing your Medications? Y  Managing your Finances? Y  Housekeeping or managing your Housekeeping? Y  Some recent data might be hidden    Patient Care Team: Margit Hanks, MD as PCP - General (Internal Medicine) West Bali, MD (Gastroenterology) Chilton Si Chong Sicilian, NP as Nurse Practitioner (Geriatric Medicine) [facility]Sk[facility] Facility)    Assessment:   This is a routine wellness examination for Alejandra Cruz.  Exercise Activities and Dietary recommendations Current Exercise Habits: The patient does not participate in regular exercise at present  Goals    . DIET - INCREASE WATER INTAKE    . General - Client will not be readmitted within 30 days (C-SNP)    . General - Client will not be readmitted within 30 days (C-SNP)       Fall Risk Fall Risk  01/22/2020 07/14/2018 07/04/2017 04/29/2014 07/30/2013  Falls in the  past year? 0 No No Yes Yes  Number falls in past yr: 0 - - 1 2 or more  Injury with Fall? - - - No -  Risk for fall due to : - - - - Impaired balance/gait;Impaired mobility;History of fall(s)   Is the patient's home free of loose throw rugs in walkways, pet beds, electrical cords, etc?   yes      Grab bars in the bathroom? yes      Handrails on the stairs?   n/a      Adequate lighting?   yes  Timed Get Up and Go performed: unable to perform   Depression Screen PHQ 2/9 Scores 01/22/2020 07/14/2018 07/04/2017 04/29/2014  PHQ - 2 Score 0 1 4 2   PHQ- 9 Score 0 - 11 12     Cognitive Function MMSE - Mini Mental State Exam 04/29/2014  Orientation to time 5  Orientation to Place 5  Registration 3  Attention/ Calculation 5  Recall 2  Language- name 2 objects 2  Language- repeat 1  Language- follow 3 step command 3  Language- read & follow direction 1  Write a sentence 1  Copy design 0  Total score 28     6CIT Screen 01/22/2020 07/14/2018 07/04/2017  What Year? 0 points 0 points 0 points  What month? 0 points 0 points 0 points  What time? 0 points 0 points 0 points  Count back from 20 2 points 0 points 0 points  Months in reverse 2 points 0 points 0 points  Repeat phrase 2 points 4 points 4 points  Total Score 6 4 4     Immunization History  Administered Date(s) Administered  . Influenza Split 07/25/2012  . Influenza,inj,Quad PF,6+ Mos 07/30/2013, 08/18/2014  . Influenza-Unspecified 07/12/2017, 07/11/2018, 07/13/2019  . Moderna SARS-COVID-2 Vaccination 10/14/2019, 11/11/2019  . PPD Test  12/06/2014  . Pneumococcal Conjugate-13 04/29/2014  . Pneumococcal Polysaccharide-23 07/25/2012  . Pneumococcal-Unspecified 08/01/2016  . Tdap 08/08/2011, 06/20/2017    Qualifies for Shingles Vaccine? Long term resident of SNF   Screening Tests Health Maintenance  Topic Date Due  . INFLUENZA VACCINE  05/08/2020  . TETANUS/TDAP  06/21/2027  . DEXA SCAN  Completed  . PNA vac Low Risk Adult   Completed    Cancer Screenings: Lung: Low Dose CT Chest recommended if Age 26-80 years, 30 pack-year currently smoking OR have quit w/in 15years. Patient not qualify. Breast:  Up to date on Mammogram? n/a   Up to date of Bone Density/Dexa? n/a Colorectal: n/a  Additional Screenings: n/a Hepatitis C Screening:      Plan:     I have personally reviewed and noted the following in the patient's chart:   . Medical and social history . Use of alcohol, tobacco or illicit drugs  . Current medications and supplements . Functional ability and status . Nutritional status . Physical activity . Advanced directives . List of other physicians . Hospitalizations, surgeries, and ER visits in previous 12 months . Vitals . Screenings to include cognitive, depression, and falls . Referrals and appointments  In addition, I have reviewed and discussed with patient certain preventive protocols, quality metrics, and best practice recommendations. A written personalized care plan for preventive services as well as general preventive health recommendations were provided to patient.     Gerlene Fee, NP  01/22/2020

## 2020-01-22 NOTE — Patient Instructions (Signed)
  Alejandra Cruz , Thank you for taking time to come for your Medicare Wellness Visit. I appreciate your ongoing commitment to your health goals. Please review the following plan we discussed and let me know if I can assist you in the future.   These are the goals we discussed: Goals    . DIET - INCREASE WATER INTAKE    . General - Client will not be readmitted within 30 days (C-SNP)    . General - Client will not be readmitted within 30 days (C-SNP)       This is a list of the screening recommended for you and due dates:  Health Maintenance  Topic Date Due  . Flu Shot  05/08/2020  . Tetanus Vaccine  06/21/2027  . DEXA scan (bone density measurement)  Completed  . Pneumonia vaccines  Completed

## 2020-01-28 ENCOUNTER — Non-Acute Institutional Stay (SKILLED_NURSING_FACILITY): Payer: Medicare PPO | Admitting: Adult Health

## 2020-01-28 ENCOUNTER — Encounter: Payer: Self-pay | Admitting: Adult Health

## 2020-01-28 DIAGNOSIS — G40909 Epilepsy, unspecified, not intractable, without status epilepticus: Secondary | ICD-10-CM

## 2020-01-28 DIAGNOSIS — G301 Alzheimer's disease with late onset: Secondary | ICD-10-CM | POA: Diagnosis not present

## 2020-01-28 DIAGNOSIS — F02818 Dementia in other diseases classified elsewhere, unspecified severity, with other behavioral disturbance: Secondary | ICD-10-CM

## 2020-01-28 DIAGNOSIS — F0281 Dementia in other diseases classified elsewhere with behavioral disturbance: Secondary | ICD-10-CM | POA: Diagnosis not present

## 2020-01-28 DIAGNOSIS — J410 Simple chronic bronchitis: Secondary | ICD-10-CM | POA: Diagnosis not present

## 2020-01-28 NOTE — Progress Notes (Signed)
Location:    [facility] Room Number: 116/D Place of Service:  SNF (31)   CODE STATUS: Full Code  No Known Allergies  Chief Complaint  Patient presents with  . Acute Visit    Care Plan Meeting    HPI:  We have come together for her care plan meeting. Family present. BIMS 9/15 mood 0/30. She has has 4 falls since Jan without injury. Weight is stable. Does require some assistance with her adls. She continues to be followed for her chronic illnesses including: dementia; seizures bronchitis.  We have discussed her advanced directives including her code status; tube feeding; hospitalization. At this time her family wishes to discuss these issues before making a decision. They do want IVF and ABT.   Past Medical History:  Diagnosis Date  . Diverticulosis OCT 2010 TCS   SIGMOID COLON  . Esophageal web 2010   DILATION 16 MM  . Gastritis, Helicobacter pylori 2010   Rx. ABO for 10 days   . GERD (gastroesophageal reflux disease)   . Hip fracture, left (HCC)    2016  . Hyperlipidemia   . Hypertension   . Restless legs syndrome   . Seizures [facility]) January 2010   Post head trauma in fall .sees Dr Gerilyn Pilgrim    Past Surgical History:  Procedure Laterality Date  . ABDOMINAL HYSTERECTOMY    . COLONOSCOPY  OCT 2010   TORTUOUS, Sml IH, Tics, MULTIPLE SIMPLE ADENOMAS (<6MM)  . HIP ARTHROPLASTY Left 11/19/2014   Procedure: LEFT PARTIAL HIP REPLACEMENT;  Surgeon: Vickki Hearing, MD;  Location: [facility];  Service: Orthopedics;  Laterality: Left;  . ORIF HIP FRACTURE  04/05/2012   Procedure: OPEN REDUCTION INTERNAL FIXATION HIP;  Surgeon: Vickki Hearing, MD;  Location: [facility];  Service: Orthopedics;  Laterality: Right;  . UPPER GASTROINTESTINAL ENDOSCOPY  AUG 2010   SAVARY  . VESICOVAGINAL FISTULA CLOSURE W/ TAH  1966   bleeding     Social History   Socioeconomic History  . Marital status: Widowed    Spouse name: Not on file  . Number of children: Not on file   . Years of education: Not on file  . Highest education level: Not on file  Occupational History  . 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 agent   Tobacco Use  . Smoking status: Never Smoker  . Smokeless tobacco: Never Used  Substance and Sexual Activity  . Alcohol use: No  . Drug use: No  . Sexual activity: Never  Other Topics Concern  . Not on file  Social History Narrative  . Not on file   Social Determinants of Health   Financial Resource Strain:   . Difficulty of Paying Living Expenses:   Food Insecurity:   . Worried About Programme researcher, broadcasting/film/video in the Last Year:   . Barista in the Last Year:   Transportation Needs:   . Freight forwarder (Medical):   Marland Kitchen Lack of Transportation (Non-Medical):   Physical Activity:   . Days of Exercise per Week:   . Minutes of Exercise per Session:   Stress:   . Feeling of Stress :   Social Connections:   . Frequency of Communication with Friends and Family:   . Frequency of Social Gatherings with Friends and Family:   . Attends Religious Services:   . Active Member of Clubs or Organizations:   . Attends Banker Meetings:   Marland Kitchen Marital Status:   Intimate Programme researcher, broadcasting/film/video  Violence:   . Fear of Current or Ex-Partner:   . Emotionally Abused:   Marland Kitchen Physically Abused:   . Sexually Abused:    Family History  Problem Relation Age of Onset  . Cancer Mother        bladder   . Diabetes Mother   . Diabetes Father   . Emphysema Father   . GER disease Brother   . Colon cancer Neg Hx   . Colon polyps Neg Hx       VITAL SIGNS BP 135/78   Pulse 62   Temp (!) 97.4 F (36.3 C) (Oral)   Resp 20   Ht 5\' 7"  (1.702 m)   Wt 244 lb 3.2 oz (110.8 kg)   SpO2 94%   BMI 38.25 kg/m   Outpatient Encounter Medications as of 01/28/2020  Medication Sig  . acetaminophen (TYLENOL) 325 MG tablet Take 650 mg by mouth every 4 (four) hours as needed.  01/30/2020 acetaminophen (TYLENOL) 650 MG CR tablet Take 1,300 mg by mouth daily.   Marland Kitchen  aspirin 81 MG chewable tablet Chew 81 mg by mouth daily.  . calcium-vitamin D (OSCAL WITH D) 500-200 MG-UNIT TABS TAKE (1) TABLET BY MOUTH (3) TIMES DAILY WITH MEALS.  Marland Kitchen docusate sodium (COLACE) 100 MG capsule Take 200 mg by mouth every morning.   Marland Kitchen FLUoxetine (PROZAC) 10 MG tablet Take 1 tablet 10 mg by mouth along with 20 mg to equal 30 mg once a day  . FLUoxetine (PROZAC) 20 MG tablet Take 1 tablet 20 mg along with 10 mg to equal 30 mg by mouth once a day  . hydrochlorothiazide (HYDRODIURIL) 12.5 MG tablet Take 12.5 mg by mouth daily.  Marland Kitchen ipratropium-albuterol (DUONEB) 0.5-2.5 (3) MG/3ML SOLN Take 3 mLs by nebulization every 6 (six) hours as needed.  . levETIRAcetam (KEPPRA) 500 MG tablet TAKE 1 TABLET BY MOUTH TWICE A DAY AS DIRECTED.  Marland Kitchen lovastatin (MEVACOR) 20 MG tablet Take 1 tablet (20 mg total) by mouth at bedtime.  . Melatonin 5 MG TABS Take 5 mg by mouth at bedtime.  . memantine (NAMENDA) 5 MG tablet Take 5 mg by mouth 2 (two) times daily.  . NON FORMULARY Diet type:  NAS  . polyethylene glycol (MIRALAX / GLYCOLAX) packet Take 17 g by mouth daily as needed.   . potassium chloride (K-DUR,KLOR-CON) 10 MEQ tablet Take 10 mEq by mouth daily.  Marland Kitchen senna (SENOKOT) 8.6 MG TABS tablet Take 2 tablets by mouth daily.   . SYMBICORT 80-4.5 MCG/ACT inhaler Inhale 2 puffs into the lungs 2 (two) times daily.    No facility-administered encounter medications on file as of 01/28/2020.     SIGNIFICANT DIAGNOSTIC EXAMS   PREVIOUS  09-07-19: DEXA: t score -1.06  NO NEW EXAMS  LABS REVIEWED PREVIOUS  01-02-19: wbc 5.3 hgb 12.7; hct 39.9; mcv 93.7; plt 162; glucose 98; bun 24; creat 0.72; k+ 4.2; na++142 ca 8.7 chol 145 ldl 87; tri 56; hdl 47  07-13-19: wbc 3.0; hgb 12.5; hct 38.6; mcv 92.3 plt 117; glucose 99; bun 15; creat 0.66; k+ 3.5; na++ 132; ca 8.1; liver normal albumin 35; chol 126; ldl 81; trig 41; hdl 37  NO NEW LABS.   Review of Systems  Constitutional: Negative for malaise/fatigue.    Respiratory: Negative for cough and shortness of breath.   Cardiovascular: Negative for chest pain, palpitations and leg swelling.  Gastrointestinal: Negative for abdominal pain, constipation and heartburn.  Musculoskeletal: Negative for back pain, joint pain and  myalgias.  Skin: Negative.   Neurological: Negative for dizziness.  Psychiatric/Behavioral: The patient is not nervous/anxious.        Physical Exam Constitutional:      General: She is not in acute distress.    Appearance: She is well-developed. She is obese. She is not diaphoretic.  Neck:     Thyroid: No thyromegaly.  Cardiovascular:     Rate and Rhythm: Normal rate and regular rhythm.     Pulses: Normal pulses.     Heart sounds: Normal heart sounds.  Pulmonary:     Effort: Pulmonary effort is normal. No respiratory distress.     Breath sounds: Normal breath sounds.  Abdominal:     General: Bowel sounds are normal. There is no distension.     Palpations: Abdomen is soft.     Tenderness: There is no abdominal tenderness.  Musculoskeletal:     Cervical back: Neck supple.     Right lower leg: No edema.     Left lower leg: No edema.     Comments: Is able to move all extremities Uses wheelchair  2016: left hip fracture left hip replacement   Lymphadenopathy:     Cervical: No cervical adenopathy.  Skin:    General: Skin is warm and dry.  Neurological:     Mental Status: She is alert. Mental status is at baseline.  Psychiatric:        Mood and Affect: Mood normal.      ASSESSMENT/ PLAN:  TODAY  1. Late onset alzheimer's disease without behavioral disturbance 2. Seizure disorder 3. Simple chronic bronchitis.   Will continue current medications Will continue current plan of care Will continue to monitor her status.  Her family will call the facility for any decisions made for her advanced directives   Time spent with patient 35 minutes (20 minutes with advanced directives) hospitalizations tube feeding  cod status.       MD is aware of resident's narcotic use and is in agreement with current plan of care. We will attempt to wean resident as appropriate.  Ok Edwards NP [facility] Adult Medicine  Contact [phone] Monday through Friday 8am- 5pm  After hours call [phone]

## 2020-01-29 ENCOUNTER — Non-Acute Institutional Stay (SKILLED_NURSING_FACILITY): Payer: Medicare PPO | Admitting: Adult Health

## 2020-01-29 ENCOUNTER — Encounter: Payer: Self-pay | Admitting: Adult Health

## 2020-01-29 DIAGNOSIS — F429 Obsessive-compulsive disorder, unspecified: Secondary | ICD-10-CM | POA: Diagnosis not present

## 2020-01-29 DIAGNOSIS — G8929 Other chronic pain: Secondary | ICD-10-CM

## 2020-01-29 DIAGNOSIS — R52 Pain, unspecified: Secondary | ICD-10-CM

## 2020-01-29 DIAGNOSIS — E785 Hyperlipidemia, unspecified: Secondary | ICD-10-CM | POA: Diagnosis not present

## 2020-01-29 NOTE — Progress Notes (Signed)
Location:    [place] Room Number: 116/D Place of Service:  SNF (31)   CODE STATUS: Full Code  No Known Allergies  Chief Complaint  Patient presents with  . Medical Management of Chronic Issues         Unspecified hyperlipidemia:  Unspecified obsessive compulsive disorder:   Chronic generalized pain:    HPI:  She is a 86 year old long term resident of this facility being seen for the management of her chronic illnesses: hyperlipidemia; obs; pain. There are no reports of uncontrolled pain; appetite is good; no reports of anxiety or agitation.   Past Medical History:  Diagnosis Date  . Diverticulosis OCT 2010 TCS   SIGMOID COLON  . Esophageal web 2010   DILATION 16 MM  . Gastritis, Helicobacter pylori 5361   Rx. ABO for 10 days   . GERD (gastroesophageal reflux disease)   . Hip fracture, left ([place])    2016  . Hyperlipidemia   . Hypertension   . Restless legs syndrome   . Seizures [facility]) January 2010   Post head trauma in fall .sees [name]    Past Surgical History:  Procedure Laterality Date  . ABDOMINAL HYSTERECTOMY    . COLONOSCOPY  OCT 2010   TORTUOUS, Sml IH, Tics, MULTIPLE SIMPLE ADENOMAS (<6MM)  . HIP ARTHROPLASTY Left 11/19/2014   Procedure: LEFT PARTIAL HIP REPLACEMENT;  Surgeon: [name], MD;  Location: [facility];  Service: Orthopedics;  Laterality: Left;  . ORIF HIP FRACTURE  04/05/2012   Procedure: OPEN REDUCTION INTERNAL FIXATION HIP;  Surgeon: [name], MD;  Location: [facility];  Service: Orthopedics;  Laterality: Right;  . UPPER GASTROINTESTINAL ENDOSCOPY  AUG 2010   SAVARY  . VESICOVAGINAL FISTULA CLOSURE W/ TAH  1966   bleeding     Social History   Socioeconomic History  . Marital status: Widowed    Spouse name: Not on file  . Number of children: Not on file  . Years of education: Not on file  . Highest education level: Not on file  Occupational History  . Occupation: custodian / nutritionist  -Market researcher   Tobacco Use  . Smoking status: Never Smoker  . Smokeless tobacco: Never Used  Substance and Sexual Activity  . Alcohol use: No  . Drug use: No  . Sexual activity: Never  Other Topics Concern  . Not on file  Social History Narrative  . Not on file   Social Determinants of Health   Financial Resource Strain:   . Difficulty of Paying Living Expenses:   Food Insecurity:   . Worried About Charity fundraiser in the Last Year:   . Arboriculturist in the Last Year:   Transportation Needs:   . Film/video editor (Medical):   Marland Kitchen Lack of Transportation (Non-Medical):   Physical Activity:   . Days of Exercise per Week:   . Minutes of Exercise per Session:   Stress:   . Feeling of Stress :   Social Connections:   . Frequency of Communication with Friends and Family:   . Frequency of Social Gatherings with Friends and Family:   . Attends Religious Services:   . Active Member of Clubs or Organizations:   . Attends Archivist Meetings:   Marland Kitchen Marital Status:   Intimate Partner Violence:   . Fear of Current or Ex-Partner:   . Emotionally Abused:   Marland Kitchen Physically Abused:   . Sexually Abused:  Family History  Problem Relation Age of Onset  . Cancer Mother        bladder   . Diabetes Mother   . Diabetes Father   . Emphysema Father   . GER disease Brother   . Colon cancer Neg Hx   . Colon polyps Neg Hx       VITAL SIGNS BP 135/78   Pulse 62   Temp (!) 96.9 F (36.1 C) (Oral)   Resp 20   Ht 5\' 7"  (1.702 m)   Wt 244 lb 3.2 oz (110.8 kg)   SpO2 94%   BMI 38.25 kg/m   Outpatient Encounter Medications as of 01/29/2020  Medication Sig  . acetaminophen (TYLENOL) 325 MG tablet Take 650 mg by mouth every 4 (four) hours as needed.  01/31/2020 acetaminophen (TYLENOL) 650 MG CR tablet Take 1,300 mg by mouth daily.   Marland Kitchen aspirin 81 MG chewable tablet Chew 81 mg by mouth daily.  . calcium-vitamin D (OSCAL WITH D) 500-200 MG-UNIT TABS TAKE (1) TABLET BY MOUTH  (3) TIMES DAILY WITH MEALS.  Marland Kitchen docusate sodium (COLACE) 100 MG capsule Take 200 mg by mouth every morning.   Marland Kitchen FLUoxetine (PROZAC) 10 MG tablet Take 1 tablet 10 mg by mouth along with 20 mg to equal 30 mg once a day  . FLUoxetine (PROZAC) 20 MG tablet Take 1 tablet 20 mg along with 10 mg to equal 30 mg by mouth once a day  . hydrochlorothiazide (HYDRODIURIL) 12.5 MG tablet Take 12.5 mg by mouth daily.  Marland Kitchen ipratropium-albuterol (DUONEB) 0.5-2.5 (3) MG/3ML SOLN Take 3 mLs by nebulization every 6 (six) hours as needed.  . levETIRAcetam (KEPPRA) 500 MG tablet TAKE 1 TABLET BY MOUTH TWICE A DAY AS DIRECTED.  Marland Kitchen lovastatin (MEVACOR) 20 MG tablet Take 1 tablet (20 mg total) by mouth at bedtime.  . Melatonin 5 MG TABS Take 5 mg by mouth at bedtime.  . memantine (NAMENDA) 5 MG tablet Take 5 mg by mouth 2 (two) times daily.  . NON FORMULARY Diet type:  NAS  . polyethylene glycol (MIRALAX / GLYCOLAX) packet Take 17 g by mouth daily as needed.   . potassium chloride (K-DUR,KLOR-CON) 10 MEQ tablet Take 10 mEq by mouth daily.  Marland Kitchen senna (SENOKOT) 8.6 MG TABS tablet Take 2 tablets by mouth daily.   . SYMBICORT 80-4.5 MCG/ACT inhaler Inhale 2 puffs into the lungs 2 (two) times daily.    No facility-administered encounter medications on file as of 01/29/2020.     SIGNIFICANT DIAGNOSTIC EXAMS  PREVIOUS  09-07-19: DEXA: t score -1.06  NO NEW EXAMS.   LABS REVIEWED PREVIOUS  07-13-19: wbc 3.0; hgb 12.5; hct 38.6; mcv 92.3 plt 117; glucose 99; bun 15; creat 0.66; k+ 3.5; na++ 132; ca 8.1; liver normal albumin 35; chol 126; ldl 81; trig 41; hdl 37  NO NEW LABS.   Review of Systems  Constitutional: Negative for malaise/fatigue.  Respiratory: Negative for cough and shortness of breath.   Cardiovascular: Negative for chest pain, palpitations and leg swelling.  Gastrointestinal: Negative for abdominal pain, constipation and heartburn.  Musculoskeletal: Negative for back pain, joint pain and myalgias.  Skin:  Negative.   Neurological: Negative for dizziness.  Psychiatric/Behavioral: The patient is not nervous/anxious.     Physical Exam Constitutional:      General: She is not in acute distress.    Appearance: She is well-developed. She is obese. She is not diaphoretic.  Neck:     Thyroid: No thyromegaly.  Cardiovascular:     Rate and Rhythm: Normal rate and regular rhythm.     Pulses: Normal pulses.     Heart sounds: Normal heart sounds.  Pulmonary:     Effort: Pulmonary effort is normal. No respiratory distress.     Breath sounds: Normal breath sounds.  Abdominal:     General: Bowel sounds are normal. There is no distension.     Palpations: Abdomen is soft.     Tenderness: There is no abdominal tenderness.  Musculoskeletal:     Cervical back: Neck supple.     Right lower leg: No edema.     Left lower leg: No edema.     Comments:  Is able to move all extremities Uses wheelchair  2016: left hip fracture left hip replacement    Lymphadenopathy:     Cervical: No cervical adenopathy.  Skin:    General: Skin is warm and dry.  Neurological:     Mental Status: She is alert. Mental status is at baseline.  Psychiatric:        Mood and Affect: Mood normal.      ASSESSMENT/ PLAN:  TODAY:   1. Unspecified hyperlipidemia: is stable LDL 81 will continue mevacor 20 mg daily   2. Unspecified obsessive compulsive disorder: is stable will continue prozac 30 mg and melatonin 5 mg nightly   3. Chronic generalized pain: is stable will continue tylenol 1300 mg daily   PREVIOUS  4. Seizure disorder: is stable no reports of seizure activity will continue keppra 500 mg twice daily   5. Chronic deep vein thrombosis (DVT) popliteal vein unspecified laterality: is stable will monitor   6. Essential hypertension: is stable b/p 135/78  will continue hctz 12.5 mg daily   7. Dementia without behavioral disturbance: is without change: will continue namenda 5 mg twice daily   8. Simple chronic  bronchitis is stable will continue symbicort 80/4.5 mcg 2 puffs twice daily  will monitor  9. Chronic constipation: is stable will continue colace 200 mg daily senna 2 tabs daily miralax daily as needed  10. Hypokalemia; is stable k+ 3.5 will continue k+ 10 meq daily       MD is aware of resident's narcotic use and is in agreement with current plan of care. We will attempt to wean resident as appropriate.  Synthia Innocent NP [facility]  Contact [phone] Monday through Friday 8am- 5pm  After hours call [phone]

## 2020-02-04 ENCOUNTER — Other Ambulatory Visit (HOSPITAL_COMMUNITY)
Admission: RE | Admit: 2020-02-04 | Discharge: 2020-02-04 | Disposition: A | Discharge status: Home / Self Care | Payer: Medicare PPO | Source: Skilled Nursing Facility | Attending: Adult Health | Admitting: Adult Health

## 2020-02-04 DIAGNOSIS — D649 Anemia, unspecified: Secondary | ICD-10-CM | POA: Diagnosis present

## 2020-02-04 DIAGNOSIS — I10 Essential (primary) hypertension: Secondary | ICD-10-CM | POA: Diagnosis not present

## 2020-02-04 DIAGNOSIS — E785 Hyperlipidemia, unspecified: Secondary | ICD-10-CM | POA: Diagnosis not present

## 2020-02-04 DIAGNOSIS — M1990 Unspecified osteoarthritis, unspecified site: Secondary | ICD-10-CM | POA: Diagnosis not present

## 2020-02-04 LAB — COMPREHENSIVE METABOLIC PANEL
ALT: 17 U/L (ref 0–44)
AST: 22 U/L (ref 15–41)
Albumin: 4 g/dL (ref 3.5–5.0)
Alkaline Phosphatase: 48 U/L (ref 38–126)
Anion gap: 9 (ref 5–15)
BUN: 25 mg/dL — ABNORMAL HIGH (ref 8–23)
CO2: 31 mmol/L (ref 22–32)
Calcium: 9 mg/dL (ref 8.9–10.3)
Chloride: 101 mmol/L (ref 98–111)
Creatinine, Ser: 0.72 mg/dL (ref 0.44–1.00)
GFR calc Af Amer: >60 mL/min (ref >60)
GFR calc non Af Amer: >60 mL/min (ref >60)
Glucose, Bld: 88 mg/dL (ref 70–99)
Potassium: 3.7 mmol/L (ref 3.5–5.1)
Sodium: 141 mmol/L (ref 135–145)
Total Bilirubin: 0.6 mg/dL (ref 0.3–1.2)
Total Protein: 7.1 g/dL (ref 6.5–8.1)

## 2020-02-04 LAB — LIPID PANEL
Cholesterol: 167 mg/dL (ref 0–200)
HDL: 50 mg/dL (ref >40)
LDL Cholesterol: 102 mg/dL — ABNORMAL HIGH (ref 0–99)
Total CHOL/HDL Ratio: 3.3 RATIO
Triglycerides: 75 mg/dL (ref <150)
VLDL: 15 mg/dL (ref 0–40)

## 2020-02-04 LAB — CBC
HCT: 40.2 % (ref 36.0–46.0)
Hemoglobin: 12.8 g/dL (ref 12.0–15.0)
MCH: 30 pg (ref 26.0–34.0)
MCHC: 31.8 g/dL (ref 30.0–36.0)
MCV: 94.1 fL (ref 80.0–100.0)
Platelets: 170 10*3/uL (ref 150–400)
RBC: 4.27 MIL/uL (ref 3.87–5.11)
RDW: 14.5 % (ref 11.5–15.5)
WBC: 4.8 10*3/uL (ref 4.0–10.5)
nRBC: 0 % (ref 0.0–0.2)

## 2020-03-01 ENCOUNTER — Encounter: Payer: Self-pay | Admitting: Adult Health

## 2020-03-01 ENCOUNTER — Non-Acute Institutional Stay (SKILLED_NURSING_FACILITY): Payer: Medicare PPO | Admitting: Adult Health

## 2020-03-01 DIAGNOSIS — G40909 Epilepsy, unspecified, not intractable, without status epilepticus: Secondary | ICD-10-CM | POA: Diagnosis not present

## 2020-03-01 DIAGNOSIS — I10 Essential (primary) hypertension: Secondary | ICD-10-CM

## 2020-03-01 DIAGNOSIS — I82539 Chronic embolism and thrombosis of unspecified popliteal vein: Secondary | ICD-10-CM

## 2020-03-01 NOTE — Progress Notes (Signed)
Location:    [place] Room Number: 116/D Place of Service:  SNF (31)   CODE STATUS: Full Code  No Known Allergies  Chief Complaint  Patient presents with  . Medical Management of Chronic Issues          Seizure disorder:    Chronic deep vein thrombosis (DVT) popliteal vein unspecified  Essential hypertension    HPI:  She is a 86 year old long term resident of this facility being seen for the management of her chronic illnesses: seizures; dvt; hypertension. There are no reports of agitation or anxiety; no reports of changes in appetite; no reports of uncontrolled pain.   Past Medical History:  Diagnosis Date  . Diverticulosis OCT 2010 TCS   SIGMOID COLON  . Esophageal web 2010   DILATION 16 MM  . Gastritis, Helicobacter pylori 7124   Rx. ABO for 10 days   . GERD (gastroesophageal reflux disease)   . Hip fracture, left ([place])    2016  . Hyperlipidemia   . Hypertension   . Restless legs syndrome   . Seizures [facility]) January 2010   Post head trauma in fall .sees [name]    Past Surgical History:  Procedure Laterality Date  . ABDOMINAL HYSTERECTOMY    . COLONOSCOPY  OCT 2010   TORTUOUS, Sml IH, Tics, MULTIPLE SIMPLE ADENOMAS (<6MM)  . HIP ARTHROPLASTY Left 11/19/2014   Procedure: LEFT PARTIAL HIP REPLACEMENT;  Surgeon: [name], MD;  Location: [facility];  Service: Orthopedics;  Laterality: Left;  . ORIF HIP FRACTURE  04/05/2012   Procedure: OPEN REDUCTION INTERNAL FIXATION HIP;  Surgeon: [name], MD;  Location: [facility];  Service: Orthopedics;  Laterality: Right;  . UPPER GASTROINTESTINAL ENDOSCOPY  AUG 2010   SAVARY  . VESICOVAGINAL FISTULA CLOSURE W/ TAH  1966   bleeding     Social History   Socioeconomic History  . Marital status: Widowed    Spouse name: Not on file  . Number of children: Not on file  . Years of education: Not on file  . Highest education level: Not on file  Occupational History  . Occupation:  custodian / nutritionist -Market researcher   Tobacco Use  . Smoking status: Never Smoker  . Smokeless tobacco: Never Used  Substance and Sexual Activity  . Alcohol use: No  . Drug use: No  . Sexual activity: Never  Other Topics Concern  . Not on file  Social History Narrative  . Not on file   Social Determinants of Health   Financial Resource Strain:   . Difficulty of Paying Living Expenses:   Food Insecurity:   . Worried About Charity fundraiser in the Last Year:   . Arboriculturist in the Last Year:   Transportation Needs:   . Film/video editor (Medical):   Marland Kitchen Lack of Transportation (Non-Medical):   Physical Activity:   . Days of Exercise per Week:   . Minutes of Exercise per Session:   Stress:   . Feeling of Stress :   Social Connections:   . Frequency of Communication with Friends and Family:   . Frequency of Social Gatherings with Friends and Family:   . Attends Religious Services:   . Active Member of Clubs or Organizations:   . Attends Archivist Meetings:   Marland Kitchen Marital Status:   Intimate Partner Violence:   . Fear of Current or Ex-Partner:   . Emotionally Abused:   .  Physically Abused:   . Sexually Abused:    Family History  Problem Relation Age of Onset  . Cancer Mother        bladder   . Diabetes Mother   . Diabetes Father   . Emphysema Father   . GER disease Brother   . Colon cancer Neg Hx   . Colon polyps Neg Hx       VITAL SIGNS BP 137/68   Pulse (!) 55   Temp 97.7 F (36.5 C) (Oral)   Resp 20   Ht 5\' 7"  (1.702 m)   Wt 246 lb 3.2 oz (111.7 kg)   SpO2 94%   BMI 38.56 kg/m   Outpatient Encounter Medications as of 03/01/2020  Medication Sig  . acetaminophen (TYLENOL) 325 MG tablet Take 650 mg by mouth every 4 (four) hours as needed.  03/03/2020 acetaminophen (TYLENOL) 650 MG CR tablet Take 1,300 mg by mouth daily.   Marland Kitchen aspirin 81 MG chewable tablet Chew 81 mg by mouth daily.  . calcium-vitamin D (OSCAL WITH D) 500-200 MG-UNIT TABS  TAKE (1) TABLET BY MOUTH (3) TIMES DAILY WITH MEALS.  Marland Kitchen FLUoxetine (PROZAC) 10 MG tablet Take 1 tablet 10 mg by mouth along with 20 mg to equal 30 mg once a day  . FLUoxetine (PROZAC) 20 MG tablet Take 1 tablet 20 mg along with 10 mg to equal 30 mg by mouth once a day  . hydrochlorothiazide (HYDRODIURIL) 12.5 MG tablet Take 12.5 mg by mouth daily.  Marland Kitchen ipratropium-albuterol (DUONEB) 0.5-2.5 (3) MG/3ML SOLN Take 3 mLs by nebulization every 6 (six) hours as needed.  . levETIRAcetam (KEPPRA) 500 MG tablet TAKE 1 TABLET BY MOUTH TWICE A DAY AS DIRECTED.  Marland Kitchen lovastatin (MEVACOR) 20 MG tablet Take 1 tablet (20 mg total) by mouth at bedtime.  . Melatonin 5 MG TABS Take 5 mg by mouth at bedtime.  . memantine (NAMENDA) 5 MG tablet Take 5 mg by mouth 2 (two) times daily.  . NON FORMULARY Diet type:  NAS  . polyethylene glycol (MIRALAX / GLYCOLAX) packet Take 17 g by mouth daily as needed.   . potassium chloride (K-DUR,KLOR-CON) 10 MEQ tablet Take 10 mEq by mouth daily.  Marland Kitchen senna (SENOKOT) 8.6 MG TABS tablet Take 2 tablets by mouth daily.   . SYMBICORT 80-4.5 MCG/ACT inhaler Inhale 2 puffs into the lungs 2 (two) times daily.   . [DISCONTINUED] docusate sodium (COLACE) 100 MG capsule Take 200 mg by mouth every morning.    No facility-administered encounter medications on file as of 03/01/2020.     SIGNIFICANT DIAGNOSTIC EXAMS  PREVIOUS  09-07-19: DEXA: t score -1.06  NO NEW EXAMS.   LABS REVIEWED PREVIOUS  07-13-19: wbc 3.0; hgb 12.5; hct 38.6; mcv 92.3 plt 117; glucose 99; bun 15; creat 0.66; k+ 3.5; na++ 132; ca 8.1; liver normal albumin 3.5; chol 126; ldl 81; trig 41; hdl 37  TODAY  02-04-20: wbc 4.8; hgb 12.8; hct 40.2; mcv 94.1 plt 170; glucose 88; bun 25; creat 0.72; k+ 3.7; na++ 141; ca 9.0; liver normal albumin 4.0 chol 167 ldl 102 trig 75 hdl 50  Review of Systems  Constitutional: Negative for malaise/fatigue.  Respiratory: Negative for cough and shortness of breath.   Cardiovascular:  Negative for chest pain, palpitations and leg swelling.  Gastrointestinal: Negative for abdominal pain, constipation and heartburn.  Musculoskeletal: Negative for back pain, joint pain and myalgias.  Skin: Negative.   Neurological: Negative for dizziness.  Psychiatric/Behavioral: The patient is  not nervous/anxious.       Physical Exam Constitutional:      General: She is not in acute distress.    Appearance: She is well-developed. She is obese. She is not diaphoretic.  Neck:     Thyroid: No thyromegaly.  Cardiovascular:     Rate and Rhythm: Normal rate and regular rhythm.     Pulses: Normal pulses.     Heart sounds: Normal heart sounds.  Pulmonary:     Effort: Pulmonary effort is normal. No respiratory distress.     Breath sounds: Normal breath sounds.  Abdominal:     General: Bowel sounds are normal. There is no distension.     Palpations: Abdomen is soft.     Tenderness: There is no abdominal tenderness.  Musculoskeletal:     Cervical back: Neck supple.     Right lower leg: No edema.     Left lower leg: No edema.     Comments:  Is able to move all extremities Uses wheelchair  2016: left hip fracture left hip replacement     Lymphadenopathy:     Cervical: No cervical adenopathy.  Skin:    General: Skin is warm and dry.  Neurological:     Mental Status: She is alert. Mental status is at baseline.  Psychiatric:        Mood and Affect: Mood normal.      ASSESSMENT/ PLAN:  TODAY:   1. Seizure disorder: is stable no reports of seizure activity will continue keppra 500 mg twice daily   2. Chronic deep vein thrombosis (DVT) popliteal vein unspecified laterality is stable will monitor   3. Essential hypertension is stable b/p 137/68 will continue hctz 12.5 mg daily   PREVIOUS  4. Dementia without behavioral disturbance: is without change: will continue namenda 5 mg twice daily   5. Simple chronic bronchitis is stable will continue symbicort 80/4.5 mcg 2 puffs twice  daily  will monitor  6. Chronic constipation: is stable will continue colace 200 mg daily senna 2 tabs daily miralax daily as needed  7. Hypokalemia; is stable k+ 3.7 will continue k+ 10 meq daily   8. Unspecified hyperlipidemia: is stable LDL 102 will continue mevacor 20 mg daily   9. Unspecified obsessive compulsive disorder: is stable will continue prozac 30 mg and melatonin 5 mg nightly   10. Chronic generalized pain: is stable will continue tylenol 1300 mg daily     MD is aware of resident's narcotic use and is in agreement with current plan of care. We will attempt to wean resident as appropriate.  Synthia Innocent NP [facility]  Contact [phone] Monday through Friday 8am- 5pm  After hours call [phone]

## 2020-03-23 ENCOUNTER — Non-Acute Institutional Stay: Payer: Self-pay | Admitting: Adult Health

## 2020-03-23 ENCOUNTER — Encounter: Payer: Self-pay | Admitting: Adult Health

## 2020-03-23 DIAGNOSIS — F429 Obsessive-compulsive disorder, unspecified: Secondary | ICD-10-CM

## 2020-03-23 DIAGNOSIS — F418 Other specified anxiety disorders: Secondary | ICD-10-CM

## 2020-03-23 NOTE — Progress Notes (Signed)
Location:    [facility] Room Number: 116/D Place of Service:  SNF (31)   CODE STATUS: Full Code  No Known Allergies  Chief Complaint  Patient presents with  . Acute Visit    Medication Review    HPI:  We have come together for her medication review. She is presently taking prozac 30 mg daily. She did not succeed with the last wean and had to return back to the 30 mg dose. She  has had one fall without injury. She is emotionally stable. There are no reports of znxiety or depressive thought. There are no reports of agitation. No reports of changes in appetite; no reports of insomnia. She is due to attempt another wean off the prozac .   Past Medical History:  Diagnosis Date  . Diverticulosis OCT 2010 TCS   SIGMOID COLON  . Esophageal web 2010   DILATION 16 MM  . Gastritis, Helicobacter pylori 2010   Rx. ABO for 10 days   . GERD (gastroesophageal reflux disease)   . Hip fracture, left (HCC)    2016  . Hyperlipidemia   . Hypertension   . Restless legs syndrome   . Seizures [facility]) January 2010   Post head trauma in fall .sees Dr Gerilyn Pilgrim    Past Surgical History:  Procedure Laterality Date  . ABDOMINAL HYSTERECTOMY    . COLONOSCOPY  OCT 2010   TORTUOUS, Sml IH, Tics, MULTIPLE SIMPLE ADENOMAS (<6MM)  . HIP ARTHROPLASTY Left 11/19/2014   Procedure: LEFT PARTIAL HIP REPLACEMENT;  Surgeon: Vickki Hearing, MD;  Location: [facility];  Service: Orthopedics;  Laterality: Left;  . ORIF HIP FRACTURE  04/05/2012   Procedure: OPEN REDUCTION INTERNAL FIXATION HIP;  Surgeon: Vickki Hearing, MD;  Location: [facility];  Service: Orthopedics;  Laterality: Right;  . UPPER GASTROINTESTINAL ENDOSCOPY  AUG 2010   SAVARY  . VESICOVAGINAL FISTULA CLOSURE W/ TAH  1966   bleeding     Social History   Socioeconomic History  . Marital status: Widowed    Spouse name: Not on file  . Number of children: Not on file  . Years of education: Not on file  . Highest education  level: Not on file  Occupational History  . 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 agent   Tobacco Use  . Smoking status: Never Smoker  . Smokeless tobacco: Never Used  Substance and Sexual Activity  . Alcohol use: No  . Drug use: No  . Sexual activity: Never  Other Topics Concern  . Not on file  Social History Narrative  . Not on file   Social Determinants of Health   Financial Resource Strain:   . Difficulty of Paying Living Expenses:   Food Insecurity:   . Worried About Programme researcher, broadcasting/film/video in the Last Year:   . Barista in the Last Year:   Transportation Needs:   . Freight forwarder (Medical):   Marland Kitchen Lack of Transportation (Non-Medical):   Physical Activity:   . Days of Exercise per Week:   . Minutes of Exercise per Session:   Stress:   . Feeling of Stress :   Social Connections:   . Frequency of Communication with Friends and Family:   . Frequency of Social Gatherings with Friends and Family:   . Attends Religious Services:   . Active Member of Clubs or Organizations:   . Attends Banker Meetings:   Marland Kitchen Marital Status:   Intimate Programme researcher, broadcasting/film/video  Violence:   . Fear of Current or Ex-Partner:   . Emotionally Abused:   Marland Kitchen Physically Abused:   . Sexually Abused:    Family History  Problem Relation Age of Onset  . Cancer Mother        bladder   . Diabetes Mother   . Diabetes Father   . Emphysema Father   . GER disease Brother   . Colon cancer Neg Hx   . Colon polyps Neg Hx       VITAL SIGNS BP 127/79   Pulse 66   Temp 98.6 F (37 C) (Oral)   Resp 18   Ht 5\' 7"  (1.702 m)   Wt 247 lb 9.6 oz (112.3 kg)   BMI 38.78 kg/m   Outpatient Encounter Medications as of 03/23/2020  Medication Sig  . acetaminophen (TYLENOL) 325 MG tablet Take 650 mg by mouth every 4 (four) hours as needed.  Marland Kitchen acetaminophen (TYLENOL) 650 MG CR tablet Take 1,300 mg by mouth daily.   Marland Kitchen aspirin 81 MG chewable tablet Chew 81 mg by mouth daily.  .  calcium-vitamin D (OSCAL WITH D) 500-200 MG-UNIT TABS TAKE (1) TABLET BY MOUTH (3) TIMES DAILY WITH MEALS.  Marland Kitchen FLUoxetine (PROZAC) 10 MG tablet Take 1 tablet 10 mg by mouth along with 20 mg to equal 30 mg once a day  . FLUoxetine (PROZAC) 20 MG tablet Take 1 tablet 20 mg along with 10 mg to equal 30 mg by mouth once a day  . hydrochlorothiazide (HYDRODIURIL) 12.5 MG tablet Take 12.5 mg by mouth daily.  Marland Kitchen ipratropium-albuterol (DUONEB) 0.5-2.5 (3) MG/3ML SOLN Take 3 mLs by nebulization every 6 (six) hours as needed.  . levETIRAcetam (KEPPRA) 500 MG tablet TAKE 1 TABLET BY MOUTH TWICE A DAY AS DIRECTED.  Marland Kitchen lovastatin (MEVACOR) 20 MG tablet Take 1 tablet (20 mg total) by mouth at bedtime.  . Melatonin 5 MG TABS Take 5 mg by mouth at bedtime.  . memantine (NAMENDA) 5 MG tablet Take 5 mg by mouth 2 (two) times daily.  . NON FORMULARY Diet type:  NAS  . polyethylene glycol (MIRALAX / GLYCOLAX) packet Take 17 g by mouth daily as needed.   . potassium chloride (K-DUR,KLOR-CON) 10 MEQ tablet Take 10 mEq by mouth daily.  Marland Kitchen senna (SENOKOT) 8.6 MG TABS tablet Take 2 tablets by mouth daily.   . SYMBICORT 80-4.5 MCG/ACT inhaler Inhale 2 puffs into the lungs 2 (two) times daily.    No facility-administered encounter medications on file as of 03/23/2020.     SIGNIFICANT DIAGNOSTIC EXAMS   PREVIOUS  09-07-19: DEXA: t score -1.06  NO NEW EXAMS.   LABS REVIEWED PREVIOUS  07-13-19: wbc 3.0; hgb 12.5; hct 38.6; mcv 92.3 plt 117; glucose 99; bun 15; creat 0.66; k+ 3.5; na++ 132; ca 8.1; liver normal albumin 3.5; chol 126; ldl 81; trig 41; hdl 37 02-04-20: wbc 4.8; hgb 12.8; hct 40.2; mcv 94.1 plt 170; glucose 88; bun 25; creat 0.72; k+ 3.7; na++ 141; ca 9.0; liver normal albumin 4.0 chol 167 ldl 102 trig 75 hdl 50  NO NEW LABS.    Review of Systems  Constitutional: Negative for malaise/fatigue.  Respiratory: Negative for cough and shortness of breath.   Cardiovascular: Negative for chest pain,  palpitations and leg swelling.  Gastrointestinal: Negative for abdominal pain, constipation and heartburn.  Musculoskeletal: Negative for back pain, joint pain and myalgias.  Skin: Negative.   Neurological: Negative for dizziness.  Psychiatric/Behavioral: The patient is not  nervous/anxious.     Physical Exam Constitutional:      General: She is not in acute distress.    Appearance: She is well-developed. She is obese. She is not diaphoretic.  Neck:     Thyroid: No thyromegaly.  Cardiovascular:     Rate and Rhythm: Normal rate and regular rhythm.     Pulses: Normal pulses.     Heart sounds: Normal heart sounds.  Pulmonary:     Effort: Pulmonary effort is normal. No respiratory distress.     Breath sounds: Normal breath sounds.  Abdominal:     General: Bowel sounds are normal. There is no distension.     Palpations: Abdomen is soft.     Tenderness: There is no abdominal tenderness.  Musculoskeletal:     Cervical back: Neck supple.     Right lower leg: No edema.     Left lower leg: No edema.     Comments: Is able to move all extremities Uses wheelchair  2016: left hip fracture left hip replacement      Lymphadenopathy:     Cervical: No cervical adenopathy.  Skin:    General: Skin is warm and dry.  Neurological:     Mental Status: She is alert. Mental status is at baseline.  Psychiatric:        Mood and Affect: Mood normal.       ASSESSMENT/ PLAN:  TODAY  1. Obsessive compulsive disorder unspecified type 2. Depression with anxiety  Is stable will lower her prozac to 20 mg daily   MD is aware of resident's narcotic use and is in agreement with current plan of care. We will attempt to wean resident as appropriate.  Synthia Innocent NP [facility]  Contact [phone] Monday through Friday 8am- 5pm  After hours call [phone]

## 2020-03-29 ENCOUNTER — Encounter: Payer: Self-pay | Admitting: Adult Health

## 2020-03-29 ENCOUNTER — Non-Acute Institutional Stay (SKILLED_NURSING_FACILITY): Payer: Medicare PPO | Admitting: Adult Health

## 2020-03-29 DIAGNOSIS — J410 Simple chronic bronchitis: Secondary | ICD-10-CM

## 2020-03-29 DIAGNOSIS — K5909 Other constipation: Secondary | ICD-10-CM

## 2020-03-29 DIAGNOSIS — F0281 Dementia in other diseases classified elsewhere with behavioral disturbance: Secondary | ICD-10-CM | POA: Diagnosis not present

## 2020-03-29 DIAGNOSIS — G301 Alzheimer's disease with late onset: Secondary | ICD-10-CM | POA: Diagnosis not present

## 2020-03-29 NOTE — Progress Notes (Signed)
Location:    [facility] Room Number: 116/D Place of Service:  SNF (31)   CODE STATUS: Full Code  No Known Allergies  Chief Complaint  Patient presents with  . Medical Management of Chronic Issues         Dementia without behavioral disturbance   Simple chronic bronchitis   Chronic constipation:     HPI:  She is a 86 year old long term resident of this facility being seen for the management of her chronic illnesses: dementia; bronchitis; constipation. There are no reports of agitation or anxiety since her prozac dose was lowered. There are no reports of uncontrolled pain; no reports of constipation.   Past Medical History:  Diagnosis Date  . Diverticulosis OCT 2010 TCS   SIGMOID COLON  . Esophageal web 2010   DILATION 16 MM  . Gastritis, Helicobacter pylori 2010   Rx. ABO for 10 days   . GERD (gastroesophageal reflux disease)   . Hip fracture, left (HCC)    2016  . Hyperlipidemia   . Hypertension   . Restless legs syndrome   . Seizures [facility]) January 2010   Post head trauma in fall .sees Dr Gerilyn Pilgrim    Past Surgical History:  Procedure Laterality Date  . ABDOMINAL HYSTERECTOMY    . COLONOSCOPY  OCT 2010   TORTUOUS, Sml IH, Tics, MULTIPLE SIMPLE ADENOMAS (<6MM)  . HIP ARTHROPLASTY Left 11/19/2014   Procedure: LEFT PARTIAL HIP REPLACEMENT;  Surgeon: Vickki Hearing, MD;  Location: [facility];  Service: Orthopedics;  Laterality: Left;  . ORIF HIP FRACTURE  04/05/2012   Procedure: OPEN REDUCTION INTERNAL FIXATION HIP;  Surgeon: Vickki Hearing, MD;  Location: [facility];  Service: Orthopedics;  Laterality: Right;  . UPPER GASTROINTESTINAL ENDOSCOPY  AUG 2010   SAVARY  . VESICOVAGINAL FISTULA CLOSURE W/ TAH  1966   bleeding     Social History   Socioeconomic History  . Marital status: Widowed    Spouse name: Not on file  . Number of children: Not on file  . Years of education: Not on file  . Highest education level: Not on file  Occupational  History  . 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 agent   Tobacco Use  . Smoking status: Never Smoker  . Smokeless tobacco: Never Used  Substance and Sexual Activity  . Alcohol use: No  . Drug use: No  . Sexual activity: Never  Other Topics Concern  . Not on file  Social History Narrative  . Not on file   Social Determinants of Health   Financial Resource Strain:   . Difficulty of Paying Living Expenses:   Food Insecurity:   . Worried About Programme researcher, broadcasting/film/video in the Last Year:   . Barista in the Last Year:   Transportation Needs:   . Freight forwarder (Medical):   Marland Kitchen Lack of Transportation (Non-Medical):   Physical Activity:   . Days of Exercise per Week:   . Minutes of Exercise per Session:   Stress:   . Feeling of Stress :   Social Connections:   . Frequency of Communication with Friends and Family:   . Frequency of Social Gatherings with Friends and Family:   . Attends Religious Services:   . Active Member of Clubs or Organizations:   . Attends Banker Meetings:   Marland Kitchen Marital Status:   Intimate Partner Violence:   . Fear of Current or Ex-Partner:   . Emotionally  Abused:   Marland Kitchen Physically Abused:   . Sexually Abused:    Family History  Problem Relation Age of Onset  . Cancer Mother        bladder   . Diabetes Mother   . Diabetes Father   . Emphysema Father   . GER disease Brother   . Colon cancer Neg Hx   . Colon polyps Neg Hx       VITAL SIGNS BP 108/60   Pulse 76   Temp 98.4 F (36.9 C) (Oral)   Resp 18   Ht 5\' 7"  (1.702 m)   Wt 247 lb 9.6 oz (112.3 kg)   BMI 38.78 kg/m   Outpatient Encounter Medications as of 03/29/2020  Medication Sig  . acetaminophen (TYLENOL) 325 MG tablet Take 650 mg by mouth every 4 (four) hours as needed.  03/31/2020 acetaminophen (TYLENOL) 650 MG CR tablet Take 1,300 mg by mouth daily.   Marland Kitchen aspirin 81 MG chewable tablet Chew 81 mg by mouth daily.  . calcium-vitamin D (OSCAL WITH D) 500-200  MG-UNIT TABS TAKE (1) TABLET BY MOUTH (3) TIMES DAILY WITH MEALS.  Marland Kitchen FLUoxetine (PROZAC) 20 MG tablet Take 20 mg by mouth daily.   . hydrochlorothiazide (HYDRODIURIL) 12.5 MG tablet Take 12.5 mg by mouth daily.  Marland Kitchen ipratropium-albuterol (DUONEB) 0.5-2.5 (3) MG/3ML SOLN Take 3 mLs by nebulization every 6 (six) hours as needed.  . levETIRAcetam (KEPPRA) 500 MG tablet TAKE 1 TABLET BY MOUTH TWICE A DAY AS DIRECTED.  Marland Kitchen lovastatin (MEVACOR) 20 MG tablet Take 1 tablet (20 mg total) by mouth at bedtime.  . Melatonin 5 MG TABS Take 5 mg by mouth at bedtime.  . memantine (NAMENDA) 5 MG tablet Take 5 mg by mouth 2 (two) times daily.  . NON FORMULARY Diet type:  NAS  . polyethylene glycol (MIRALAX / GLYCOLAX) packet Take 17 g by mouth daily as needed.   . potassium chloride (K-DUR,KLOR-CON) 10 MEQ tablet Take 10 mEq by mouth daily.  Marland Kitchen senna (SENOKOT) 8.6 MG TABS tablet Take 2 tablets by mouth daily.   . SYMBICORT 80-4.5 MCG/ACT inhaler Inhale 2 puffs into the lungs 2 (two) times daily.   . [DISCONTINUED] FLUoxetine (PROZAC) 10 MG tablet Take 1 tablet 10 mg by mouth along with 20 mg to equal 30 mg once a day   No facility-administered encounter medications on file as of 03/29/2020.     SIGNIFICANT DIAGNOSTIC EXAMS   PREVIOUS  09-07-19: DEXA: t score -1.06  NO NEW EXAMS.   LABS REVIEWED PREVIOUS  07-13-19: wbc 3.0; hgb 12.5; hct 38.6; mcv 92.3 plt 117; glucose 99; bun 15; creat 0.66; k+ 3.5; na++ 132; ca 8.1; liver normal albumin 3.5; chol 126; ldl 81; trig 41; hdl 37 02-04-20: wbc 4.8; hgb 12.8; hct 40.2; mcv 94.1 plt 170; glucose 88; bun 25; creat 0.72; k+ 3.7; na++ 141; ca 9.0; liver normal albumin 4.0 chol 167 ldl 102 trig 75 hdl 50  NO NEW LABS.   Review of Systems  Constitutional: Negative for malaise/fatigue.  Respiratory: Negative for cough and shortness of breath.   Cardiovascular: Negative for chest pain, palpitations and leg swelling.  Gastrointestinal: Negative for abdominal pain,  constipation and heartburn.  Musculoskeletal: Negative for back pain, joint pain and myalgias.  Skin: Negative.   Neurological: Negative for dizziness.  Psychiatric/Behavioral: The patient is not nervous/anxious.      Physical Exam Constitutional:      General: She is not in acute distress.  Appearance: She is well-developed. She is obese. She is not diaphoretic.  Neck:     Thyroid: No thyromegaly.  Cardiovascular:     Rate and Rhythm: Normal rate and regular rhythm.     Pulses: Normal pulses.     Heart sounds: Normal heart sounds.  Pulmonary:     Effort: Pulmonary effort is normal. No respiratory distress.     Breath sounds: Normal breath sounds.  Abdominal:     General: Bowel sounds are normal. There is no distension.     Palpations: Abdomen is soft.     Tenderness: There is no abdominal tenderness.  Musculoskeletal:     Cervical back: Neck supple.     Right lower leg: No edema.     Left lower leg: No edema.     Comments: Is able to move all extremities Uses wheelchair  2016: left hip fracture left hip replacement       Lymphadenopathy:     Cervical: No cervical adenopathy.  Skin:    General: Skin is warm and dry.  Neurological:     Mental Status: She is alert. Mental status is at baseline.  Psychiatric:        Mood and Affect: Mood normal.      ASSESSMENT/ PLAN:  TODAY:   1. Dementia without behavioral disturbance is without change will continue namenda 5 mg twice daily   2. Simple chronic bronchitis is stable will continue symbicort 80/4.5 mcg 2 puffs twice daily   3. Chronic constipation: is stable will continue colace 200 mg daily and senna 2 tabs daily miralax daily as needed.   PREVIOUS  4. Hypokalemia; is stable k+ 3.7 will continue k+ 10 meq daily   5. Unspecified hyperlipidemia: is stable LDL 102 will continue mevacor 20 mg daily   6. Unspecified obsessive compulsive disorder: is stable will continue prozac 20 mg and melatonin 5 mg nightly    7. Chronic generalized pain: is stable will continue tylenol 1300 mg daily   8. Seizure disorder: is stable no reports of seizure activity will continue keppra 500 mg twice daily   9. Chronic deep vein thrombosis (DVT) popliteal vein unspecified laterality is stable will monitor   10. Essential hypertension is stable b/p 108/60 will continue hctz 12.5 mg daily         MD is aware of resident's narcotic use and is in agreement with current plan of care. We will attempt to wean resident as appropriate.  Ok Edwards NP [facility]  Contact [phone] Monday through Friday 8am- 5pm  After hours call [phone]

## 2020-04-21 ENCOUNTER — Non-Acute Institutional Stay (SKILLED_NURSING_FACILITY): Payer: Medicare PPO | Admitting: Adult Health

## 2020-04-21 ENCOUNTER — Encounter: Payer: Self-pay | Admitting: Adult Health

## 2020-04-21 DIAGNOSIS — G301 Alzheimer's disease with late onset: Secondary | ICD-10-CM | POA: Diagnosis not present

## 2020-04-21 DIAGNOSIS — F0281 Dementia in other diseases classified elsewhere with behavioral disturbance: Secondary | ICD-10-CM

## 2020-04-21 DIAGNOSIS — I10 Essential (primary) hypertension: Secondary | ICD-10-CM

## 2020-04-21 DIAGNOSIS — G40909 Epilepsy, unspecified, not intractable, without status epilepticus: Secondary | ICD-10-CM | POA: Diagnosis not present

## 2020-04-21 NOTE — Progress Notes (Signed)
Location:    [facility] Room Number: 116/D Place of Service:  SNF (31)   CODE STATUS: Full Code  No Known Allergies  Chief Complaint  Patient presents with  . Acute Visit    Care Plan Meeting    HPI:  We have come together for her care plan meeting. BIMS 9/15 mood 4/30. She has had one fall without injury. Her weight is stable. She does attend activities. She is extensive to total assistance with her adls. She is incontinent of bladder and bowel. There are no reports of uncontrolled pain. She continues to be followed for her chronic illnesses including:  Late onset alzheimer's disease with behavioral disturbance   Seizure   Essential hypertension  Past Medical History:  Diagnosis Date  . Diverticulosis OCT 2010 TCS   SIGMOID COLON  . Esophageal web 2010   DILATION 16 MM  . Gastritis, Helicobacter pylori 2010   Rx. ABO for 10 days   . GERD (gastroesophageal reflux disease)   . Hip fracture, left (HCC)    2016  . Hyperlipidemia   . Hypertension   . Restless legs syndrome   . Seizures [facility]) January 2010   Post head trauma in fall .sees Dr Gerilyn Pilgrim    Past Surgical History:  Procedure Laterality Date  . ABDOMINAL HYSTERECTOMY    . COLONOSCOPY  OCT 2010   TORTUOUS, Sml IH, Tics, MULTIPLE SIMPLE ADENOMAS (<6MM)  . HIP ARTHROPLASTY Left 11/19/2014   Procedure: LEFT PARTIAL HIP REPLACEMENT;  Surgeon: Vickki Hearing, MD;  Location: [facility];  Service: Orthopedics;  Laterality: Left;  . ORIF HIP FRACTURE  04/05/2012   Procedure: OPEN REDUCTION INTERNAL FIXATION HIP;  Surgeon: Vickki Hearing, MD;  Location: [facility];  Service: Orthopedics;  Laterality: Right;  . UPPER GASTROINTESTINAL ENDOSCOPY  AUG 2010   SAVARY  . VESICOVAGINAL FISTULA CLOSURE W/ TAH  1966   bleeding     Social History   Socioeconomic History  . Marital status: Widowed    Spouse name: Not on file  . Number of children: Not on file  . Years of education: Not on file  .  Highest education level: Not on file  Occupational History  . 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 agent   Tobacco Use  . Smoking status: Never Smoker  . Smokeless tobacco: Never Used  Substance and Sexual Activity  . Alcohol use: No  . Drug use: No  . Sexual activity: Never  Other Topics Concern  . Not on file  Social History Narrative  . Not on file   Social Determinants of Health   Financial Resource Strain:   . Difficulty of Paying Living Expenses:   Food Insecurity:   . Worried About Programme researcher, broadcasting/film/video in the Last Year:   . Barista in the Last Year:   Transportation Needs:   . Freight forwarder (Medical):   Marland Kitchen Lack of Transportation (Non-Medical):   Physical Activity:   . Days of Exercise per Week:   . Minutes of Exercise per Session:   Stress:   . Feeling of Stress :   Social Connections:   . Frequency of Communication with Friends and Family:   . Frequency of Social Gatherings with Friends and Family:   . Attends Religious Services:   . Active Member of Clubs or Organizations:   . Attends Banker Meetings:   Marland Kitchen Marital Status:   Intimate Partner Violence:   . Fear of  Current or Ex-Partner:   . Emotionally Abused:   Marland Kitchen Physically Abused:   . Sexually Abused:    Family History  Problem Relation Age of Onset  . Cancer Mother        bladder   . Diabetes Mother   . Diabetes Father   . Emphysema Father   . GER disease Brother   . Colon cancer Neg Hx   . Colon polyps Neg Hx       VITAL SIGNS BP 122/73   Pulse 71   Temp 98.2 F (36.8 C) (Oral)   Resp 18   Ht 5\' 7"  (1.702 m)   Wt 244 lb 6.4 oz (110.9 kg)   SpO2 94%   BMI 38.28 kg/m   Outpatient Encounter Medications as of 04/21/2020  Medication Sig  . acetaminophen (TYLENOL) 325 MG tablet Take 650 mg by mouth every 4 (four) hours as needed.  04/23/2020 acetaminophen (TYLENOL) 650 MG CR tablet Take 1,300 mg by mouth daily.   Marland Kitchen aspirin 81 MG chewable tablet Chew 81 mg by  mouth daily.  . calcium-vitamin D (OSCAL WITH D) 500-200 MG-UNIT TABS TAKE (1) TABLET BY MOUTH (3) TIMES DAILY WITH MEALS.  Marland Kitchen FLUoxetine (PROZAC) 20 MG tablet Take 20 mg by mouth daily.   . hydrochlorothiazide (HYDRODIURIL) 12.5 MG tablet Take 12.5 mg by mouth daily.  Marland Kitchen ipratropium-albuterol (DUONEB) 0.5-2.5 (3) MG/3ML SOLN Take 3 mLs by nebulization every 6 (six) hours as needed.  . levETIRAcetam (KEPPRA) 500 MG tablet TAKE 1 TABLET BY MOUTH TWICE A DAY AS DIRECTED.  Marland Kitchen lovastatin (MEVACOR) 20 MG tablet Take 1 tablet (20 mg total) by mouth at bedtime.  . Melatonin 5 MG TABS Take 5 mg by mouth at bedtime.  . memantine (NAMENDA) 5 MG tablet Take 5 mg by mouth 2 (two) times daily.  . NON FORMULARY Diet type:  NAS  . polyethylene glycol (MIRALAX / GLYCOLAX) packet Take 17 g by mouth daily as needed.   . potassium chloride (K-DUR,KLOR-CON) 10 MEQ tablet Take 10 mEq by mouth daily.  Marland Kitchen senna (SENOKOT) 8.6 MG TABS tablet Take 2 tablets by mouth daily.   . SYMBICORT 80-4.5 MCG/ACT inhaler Inhale 2 puffs into the lungs 2 (two) times daily.    No facility-administered encounter medications on file as of 04/21/2020.     SIGNIFICANT DIAGNOSTIC EXAMS   PREVIOUS  09-07-19: DEXA: t score -1.06  NO NEW EXAMS.   LABS REVIEWED PREVIOUS  07-13-19: wbc 3.0; hgb 12.5; hct 38.6; mcv 92.3 plt 117; glucose 99; bun 15; creat 0.66; k+ 3.5; na++ 132; ca 8.1; liver normal albumin 3.5; chol 126; ldl 81; trig 41; hdl 37 02-04-20: wbc 4.8; hgb 12.8; hct 40.2; mcv 94.1 plt 170; glucose 88; bun 25; creat 0.72; k+ 3.7; na++ 141; ca 9.0; liver normal albumin 4.0 chol 167 ldl 102 trig 75 hdl 50  NO NEW LABS.   Review of Systems  Constitutional: Negative for malaise/fatigue.  Respiratory: Negative for cough and shortness of breath.   Cardiovascular: Negative for chest pain, palpitations and leg swelling.  Gastrointestinal: Negative for abdominal pain, constipation and heartburn.  Musculoskeletal: Negative for back  pain, joint pain and myalgias.  Skin: Negative.   Neurological: Negative for dizziness.  Psychiatric/Behavioral: The patient is not nervous/anxious.     Physical Exam Constitutional:      General: She is not in acute distress.    Appearance: She is well-developed. She is obese. She is not diaphoretic.  Neck:  Thyroid: No thyromegaly.  Cardiovascular:     Rate and Rhythm: Normal rate and regular rhythm.     Pulses: Normal pulses.     Heart sounds: Normal heart sounds.  Pulmonary:     Effort: Pulmonary effort is normal. No respiratory distress.     Breath sounds: Normal breath sounds.  Abdominal:     General: Bowel sounds are normal. There is no distension.     Palpations: Abdomen is soft.     Tenderness: There is no abdominal tenderness.  Musculoskeletal:     Cervical back: Neck supple.     Right lower leg: No edema.     Left lower leg: No edema.     Comments: Is able to move all extremities Uses wheelchair  2016: left hip fracture left hip replacement        Lymphadenopathy:     Cervical: No cervical adenopathy.  Skin:    General: Skin is warm and dry.  Neurological:     Mental Status: She is alert. Mental status is at baseline.  Psychiatric:        Mood and Affect: Mood normal.       ASSESSMENT/ PLAN:  TODAY  1. Late onset alzheimer's disease with behavioral disturbance 2. Seizures 3. Essential hypertension  Will continue current medications Will continue current plan of care Will continue to monitor her status.     MD is aware of resident's narcotic use and is in agreement with current plan of care. We will attempt to wean resident as appropriate.  Synthia Innocent NP [facility]  Contact [phone] Monday through Friday 8am- 5pm  After hours call [phone]

## 2020-04-21 NOTE — Progress Notes (Deleted)
Location:    Nursing Home Room Number: 116/D Place of Service:  SNF (31)   CODE STATUS:   Location:    [facility] Room Number: 116/D Place of Service:  SNF (31)   CODE STATUS: Full Code  No Known Allergies  Chief Complaint  Patient presents with  . Acute Visit    Care Plan Meeting    HPI:    Past Medical History:  Diagnosis Date  . Diverticulosis OCT 2010 TCS   SIGMOID COLON  . Esophageal web 2010   DILATION 16 MM  . Gastritis, Helicobacter pylori 2010   Rx. ABO for 10 days   . GERD (gastroesophageal reflux disease)   . Hip fracture, left (HCC)    2016  . Hyperlipidemia   . Hypertension   . Restless legs syndrome   . Seizures [facility]) January 2010   Post head trauma in fall .sees Dr Gerilyn Pilgrim    Past Surgical History:  Procedure Laterality Date  . ABDOMINAL HYSTERECTOMY    . COLONOSCOPY  OCT 2010   TORTUOUS, Sml IH, Tics, MULTIPLE SIMPLE ADENOMAS (<6MM)  . HIP ARTHROPLASTY Left 11/19/2014   Procedure: LEFT PARTIAL HIP REPLACEMENT;  Surgeon: Vickki Hearing, MD;  Location: [facility];  Service: Orthopedics;  Laterality: Left;  . ORIF HIP FRACTURE  04/05/2012   Procedure: OPEN REDUCTION INTERNAL FIXATION HIP;  Surgeon: Vickki Hearing, MD;  Location: [facility];  Service: Orthopedics;  Laterality: Right;  . UPPER GASTROINTESTINAL ENDOSCOPY  AUG 2010   SAVARY  . VESICOVAGINAL FISTULA CLOSURE W/ TAH  1966   bleeding     Social History   Socioeconomic History  . Marital status: Widowed    Spouse name: Not on file  . Number of children: Not on file  . Years of education: Not on file  . Highest education level: Not on file  Occupational History  . 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 agent   Tobacco Use  . Smoking status: Never Smoker  . Smokeless tobacco: Never Used  Substance and Sexual Activity  . Alcohol use: No  . Drug use: No  . Sexual activity: Never  Other Topics Concern  . Not on file  Social History  Narrative  . Not on file   Social Determinants of Health   Financial Resource Strain:   . Difficulty of Paying Living Expenses:   Food Insecurity:   . Worried About Programme researcher, broadcasting/film/video in the Last Year:   . Barista in the Last Year:   Transportation Needs:   . Freight forwarder (Medical):   Marland Kitchen Lack of Transportation (Non-Medical):   Physical Activity:   . Days of Exercise per Week:   . Minutes of Exercise per Session:   Stress:   . Feeling of Stress :   Social Connections:   . Frequency of Communication with Friends and Family:   . Frequency of Social Gatherings with Friends and Family:   . Attends Religious Services:   . Active Member of Clubs or Organizations:   . Attends Banker Meetings:   Marland Kitchen Marital Status:   Intimate Partner Violence:   . Fear of Current or Ex-Partner:   . Emotionally Abused:   Marland Kitchen Physically Abused:   . Sexually Abused:    Family History  Problem Relation Age of Onset  . Cancer Mother        bladder   . Diabetes Mother   . Diabetes Father   .  Emphysema Father   . GER disease Brother   . Colon cancer Neg Hx   . Colon polyps Neg Hx       VITAL SIGNS BP 122/73   Pulse 71   Temp 98.2 F (36.8 C) (Oral)   Resp 18   Ht 5\' 7"  (1.702 m)   Wt 244 lb 6.4 oz (110.9 kg)   SpO2 94%   BMI 38.28 kg/m   Outpatient Encounter Medications as of 04/21/2020  Medication Sig  . acetaminophen (TYLENOL) 325 MG tablet Take 650 mg by mouth every 4 (four) hours as needed.  04/23/2020 acetaminophen (TYLENOL) 650 MG CR tablet Take 1,300 mg by mouth daily.   Marland Kitchen aspirin 81 MG chewable tablet Chew 81 mg by mouth daily.  . calcium-vitamin D (OSCAL WITH D) 500-200 MG-UNIT TABS TAKE (1) TABLET BY MOUTH (3) TIMES DAILY WITH MEALS.  Marland Kitchen FLUoxetine (PROZAC) 20 MG tablet Take 20 mg by mouth daily.   . hydrochlorothiazide (HYDRODIURIL) 12.5 MG tablet Take 12.5 mg by mouth daily.  Marland Kitchen ipratropium-albuterol (DUONEB) 0.5-2.5 (3) MG/3ML SOLN Take 3 mLs by  nebulization every 6 (six) hours as needed.  . levETIRAcetam (KEPPRA) 500 MG tablet TAKE 1 TABLET BY MOUTH TWICE A DAY AS DIRECTED.  Marland Kitchen lovastatin (MEVACOR) 20 MG tablet Take 1 tablet (20 mg total) by mouth at bedtime.  . Melatonin 5 MG TABS Take 5 mg by mouth at bedtime.  . memantine (NAMENDA) 5 MG tablet Take 5 mg by mouth 2 (two) times daily.  . NON FORMULARY Diet type:  NAS  . polyethylene glycol (MIRALAX / GLYCOLAX) packet Take 17 g by mouth daily as needed.   . potassium chloride (K-DUR,KLOR-CON) 10 MEQ tablet Take 10 mEq by mouth daily.  Marland Kitchen senna (SENOKOT) 8.6 MG TABS tablet Take 2 tablets by mouth daily.   . SYMBICORT 80-4.5 MCG/ACT inhaler Inhale 2 puffs into the lungs 2 (two) times daily.    No facility-administered encounter medications on file as of 04/21/2020.     SIGNIFICANT DIAGNOSTIC EXAMS       ASSESSMENT/ PLAN:    MD is aware of resident's narcotic use and is in agreement with current plan of care. We will attempt to wean resident as appropriate.  04/23/2020 NP [facility] Adult [facility]  Contact [phone] Monday through Friday 8am- 5pm  After hours call [phone]   No Known Allergies  Chief Complaint  Patient presents with  . Acute Visit    Care Plan Meeting    HPI:    Past Medical History:  Diagnosis Date  . Diverticulosis OCT 2010 TCS   SIGMOID COLON  . Esophageal web 2010   DILATION 16 MM  . Gastritis, Helicobacter pylori 2010   Rx. ABO for 10 days   . GERD (gastroesophageal reflux disease)   . Hip fracture, left (HCC)    2016  . Hyperlipidemia   . Hypertension   . Restless legs syndrome   . Seizures [facility]) January 2010   Post head trauma in fall .sees Dr February 2010    Past Surgical History:  Procedure Laterality Date  . ABDOMINAL HYSTERECTOMY    . COLONOSCOPY  OCT 2010   TORTUOUS, Sml IH, Tics, MULTIPLE SIMPLE ADENOMAS (<6MM)  . HIP ARTHROPLASTY Left 11/19/2014   Procedure: LEFT PARTIAL HIP REPLACEMENT;  Surgeon: 01/18/2015, MD;  Location: [facility];  Service: Orthopedics;  Laterality: Left;  . ORIF HIP FRACTURE  04/05/2012   Procedure: OPEN REDUCTION INTERNAL FIXATION HIP;  Surgeon: 04/07/2012  Elita QuickE Harrison, MD;  Location: [facility];  Service: Orthopedics;  Laterality: Right;  . UPPER GASTROINTESTINAL ENDOSCOPY  AUG 2010   SAVARY  . VESICOVAGINAL FISTULA CLOSURE W/ TAH  1966   bleeding     Social History   Socioeconomic History  . Marital status: Widowed    Spouse name: Not on file  . Number of children: Not on file  . Years of education: Not on file  . Highest education level: Not on file  Occupational History  . 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 Smoking status: Never Smoker  . Smokeless tobacco: Never Used  Substance and Sexual Activity  . Alcohol use: No  . Drug use: No  . Sexual activity: Never  Other Topics Concern  . Not on file  Social History Narrative  . Not on file   Social Determinants of Health   Financial Resource Strain:   . Difficulty of Paying Living Expenses:   Food Insecurity:   . Worried About Programme researcher, broadcasting/film/videounning Out of Food in the Last Year:   . Baristaan Out of Food in the Last Year:   Transportation Needs:   . Freight forwarderLack of Transportation (Medical):   Marland Kitchen. Lack of Transportation (Non-Medical):   Physical Activity:   . Days of Exercise per Week:   . Minutes of Exercise per Session:   Stress:   . Feeling of Stress :   Social Connections:   . Frequency of Communication with Friends and Family:   . Frequency of Social Gatherings with Friends and Family:   . Attends Religious Services:   . Active Member of Clubs or Organizations:   . Attends BankerClub or Organization Meetings:   Marland Kitchen. Marital Status:   Intimate Partner Violence:   . Fear of Current or Ex-Partner:   . Emotionally Abused:   Marland Kitchen. Physically Abused:   . Sexually Abused:    Family History  Problem Relation Age of Onset  . Cancer Mother        bladder   . Diabetes Mother   . Diabetes Father   .  Emphysema Father   . GER disease Brother   . Colon cancer Neg Hx   . Colon polyps Neg Hx       VITAL SIGNS BP 122/73   Pulse 71   Temp 98.2 F (36.8 C) (Oral)   Resp 18   Ht 5\' 7"  (1.702 m)   Wt 244 lb 6.4 oz (110.9 kg)   SpO2 94%   BMI 38.28 kg/m   Outpatient Encounter Medications as of 04/21/2020  Medication Sig  . acetaminophen (TYLENOL) 325 MG tablet Take 650 mg by mouth every 4 (four) hours as needed.  Marland Kitchen. acetaminophen (TYLENOL) 650 MG CR tablet Take 1,300 mg by mouth daily.   Marland Kitchen. aspirin 81 MG chewable tablet Chew 81 mg by mouth daily.  . calcium-vitamin D (OSCAL WITH D) 500-200 MG-UNIT TABS TAKE (1) TABLET BY MOUTH (3) TIMES DAILY WITH MEALS.  Marland Kitchen. FLUoxetine (PROZAC) 20 MG tablet Take 20 mg by mouth daily.   . hydrochlorothiazide (HYDRODIURIL) 12.5 MG tablet Take 12.5 mg by mouth daily.  Marland Kitchen. ipratropium-albuterol (DUONEB) 0.5-2.5 (3) MG/3ML SOLN Take 3 mLs by nebulization every 6 (six) hours as needed.  . levETIRAcetam (KEPPRA) 500 MG tablet TAKE 1 TABLET BY MOUTH TWICE A DAY AS DIRECTED.  Marland Kitchen. lovastatin (MEVACOR) 20 MG tablet Take 1 tablet (20 mg total) by mouth at bedtime.  . Melatonin 5 MG TABS Take 5 mg  by mouth at bedtime.  . memantine (NAMENDA) 5 MG tablet Take 5 mg by mouth 2 (two) times daily.  . NON FORMULARY Diet type:  NAS  . polyethylene glycol (MIRALAX / GLYCOLAX) packet Take 17 g by mouth daily as needed.   . potassium chloride (K-DUR,KLOR-CON) 10 MEQ tablet Take 10 mEq by mouth daily.  Marland Kitchen senna (SENOKOT) 8.6 MG TABS tablet Take 2 tablets by mouth daily.   . SYMBICORT 80-4.5 MCG/ACT inhaler Inhale 2 puffs into the lungs 2 (two) times daily.    No facility-administered encounter medications on file as of 04/21/2020.     SIGNIFICANT DIAGNOSTIC EXAMS       ASSESSMENT/ PLAN:    MD is aware of resident's narcotic use and is in agreement with current plan of care. We will attempt to wean resident as appropriate.  Synthia Innocent NP [facility] Adult Medicine    Contact [phone] Monday through Friday 8am- 5pm  After hours call [phone]

## 2020-04-22 ENCOUNTER — Non-Acute Institutional Stay (SKILLED_NURSING_FACILITY): Payer: Medicare PPO | Admitting: Adult Health

## 2020-04-22 ENCOUNTER — Encounter: Payer: Self-pay | Admitting: Adult Health

## 2020-04-22 DIAGNOSIS — E876 Hypokalemia: Secondary | ICD-10-CM | POA: Diagnosis not present

## 2020-04-22 DIAGNOSIS — E785 Hyperlipidemia, unspecified: Secondary | ICD-10-CM

## 2020-04-22 DIAGNOSIS — F429 Obsessive-compulsive disorder, unspecified: Secondary | ICD-10-CM | POA: Diagnosis not present

## 2020-04-22 NOTE — Progress Notes (Signed)
Location:    [facility] Room Number: 116/D Place of Service:  SNF (31)   CODE STATUS: Full Code  No Known Allergies  Chief Complaint  Patient presents with  . Medical Management of Chronic Issues          Hypokalemia:  Unspecified hyperlipidemia;Unspecified obsessive compulsive disorder:    HPI:  She is a 86 year old long term resident of this facility being seen for the management of her chronic illnesses:Hypokalemia:  Unspecified hyperlipidemia;  Unspecified obsessive compulsive disorder: there are no reports of uncontrolled pain. No reports of constipation; no reports of heart burn. No reports of anxiety present.   Past Medical History:  Diagnosis Date  . Diverticulosis OCT 2010 TCS   SIGMOID COLON  . Esophageal web 2010   DILATION 16 MM  . Gastritis, Helicobacter pylori 2010   Rx. ABO for 10 days   . GERD (gastroesophageal reflux disease)   . Hip fracture, left (HCC)    2016  . Hyperlipidemia   . Hypertension   . Restless legs syndrome   . Seizures [facility]) January 2010   Post head trauma in fall .sees Dr Gerilyn Pilgrim    Past Surgical History:  Procedure Laterality Date  . ABDOMINAL HYSTERECTOMY    . COLONOSCOPY  OCT 2010   TORTUOUS, Sml IH, Tics, MULTIPLE SIMPLE ADENOMAS (<6MM)  . HIP ARTHROPLASTY Left 11/19/2014   Procedure: LEFT PARTIAL HIP REPLACEMENT;  Surgeon: Vickki Hearing, MD;  Location: [facility];  Service: Orthopedics;  Laterality: Left;  . ORIF HIP FRACTURE  04/05/2012   Procedure: OPEN REDUCTION INTERNAL FIXATION HIP;  Surgeon: Vickki Hearing, MD;  Location: [facility];  Service: Orthopedics;  Laterality: Right;  . UPPER GASTROINTESTINAL ENDOSCOPY  AUG 2010   SAVARY  . VESICOVAGINAL FISTULA CLOSURE W/ TAH  1966   bleeding     Social History   Socioeconomic History  . Marital status: Widowed    Spouse name: Not on file  . Number of children: Not on file  . Years of education: Not on file  . Highest education level: Not on file   Occupational History  . 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 agent   Tobacco Use  . Smoking status: Never Smoker  . Smokeless tobacco: Never Used  Substance and Sexual Activity  . Alcohol use: No  . Drug use: No  . Sexual activity: Never  Other Topics Concern  . Not on file  Social History Narrative  . Not on file   Social Determinants of Health   Financial Resource Strain:   . Difficulty of Paying Living Expenses:   Food Insecurity:   . Worried About Programme researcher, broadcasting/film/video in the Last Year:   . Barista in the Last Year:   Transportation Needs:   . Freight forwarder (Medical):   Marland Kitchen Lack of Transportation (Non-Medical):   Physical Activity:   . Days of Exercise per Week:   . Minutes of Exercise per Session:   Stress:   . Feeling of Stress :   Social Connections:   . Frequency of Communication with Friends and Family:   . Frequency of Social Gatherings with Friends and Family:   . Attends Religious Services:   . Active Member of Clubs or Organizations:   . Attends Banker Meetings:   Marland Kitchen Marital Status:   Intimate Partner Violence:   . Fear of Current or Ex-Partner:   . Emotionally Abused:   Marland Kitchen Physically  Abused:   . Sexually Abused:    Family History  Problem Relation Age of Onset  . Cancer Mother        bladder   . Diabetes Mother   . Diabetes Father   . Emphysema Father   . GER disease Brother   . Colon cancer Neg Hx   . Colon polyps Neg Hx       VITAL SIGNS BP 122/73   Pulse 71   Temp 98.4 F (36.9 C) (Oral)   Ht 5\' 7"  (1.702 m)   Wt 244 lb 6.4 oz (110.9 kg)   BMI 38.28 kg/m   Outpatient Encounter Medications as of 04/22/2020  Medication Sig  . acetaminophen (TYLENOL) 325 MG tablet Take 650 mg by mouth every 4 (four) hours as needed.  04/24/2020 acetaminophen (TYLENOL) 650 MG CR tablet Take 1,300 mg by mouth daily.   Marland Kitchen aspirin 81 MG chewable tablet Chew 81 mg by mouth daily.  . calcium-vitamin D (OSCAL WITH D) 500-200  MG-UNIT TABS TAKE (1) TABLET BY MOUTH (3) TIMES DAILY WITH MEALS.  Marland Kitchen FLUoxetine (PROZAC) 20 MG tablet Take 20 mg by mouth daily.   . hydrochlorothiazide (HYDRODIURIL) 12.5 MG tablet Take 12.5 mg by mouth daily.  Marland Kitchen ipratropium-albuterol (DUONEB) 0.5-2.5 (3) MG/3ML SOLN Take 3 mLs by nebulization every 6 (six) hours as needed.  . levETIRAcetam (KEPPRA) 500 MG tablet TAKE 1 TABLET BY MOUTH TWICE A DAY AS DIRECTED.  Marland Kitchen lovastatin (MEVACOR) 20 MG tablet Take 1 tablet (20 mg total) by mouth at bedtime.  . Melatonin 5 MG TABS Take 5 mg by mouth at bedtime.  . memantine (NAMENDA) 5 MG tablet Take 5 mg by mouth 2 (two) times daily.  . NON FORMULARY Diet type:  NAS  . polyethylene glycol (MIRALAX / GLYCOLAX) packet Take 17 g by mouth daily as needed.   . potassium chloride (K-DUR,KLOR-CON) 10 MEQ tablet Take 10 mEq by mouth daily.  Marland Kitchen senna (SENOKOT) 8.6 MG TABS tablet Take 2 tablets by mouth daily.   . SYMBICORT 80-4.5 MCG/ACT inhaler Inhale 2 puffs into the lungs 2 (two) times daily.    No facility-administered encounter medications on file as of 04/22/2020.     SIGNIFICANT DIAGNOSTIC EXAMS   PREVIOUS  09-07-19: DEXA: t score -1.06  NO NEW EXAMS.   LABS REVIEWED PREVIOUS  07-13-19: wbc 3.0; hgb 12.5; hct 38.6; mcv 92.3 plt 117; glucose 99; bun 15; creat 0.66; k+ 3.5; na++ 132; ca 8.1; liver normal albumin 3.5; chol 126; ldl 81; trig 41; hdl 37 02-04-20: wbc 4.8; hgb 12.8; hct 40.2; mcv 94.1 plt 170; glucose 88; bun 25; creat 0.72; k+ 3.7; na++ 141; ca 9.0; liver normal albumin 4.0 chol 167 ldl 102 trig 75 hdl 50  NO NEW LABS.   Review of Systems  Constitutional: Negative for malaise/fatigue.  Respiratory: Negative for cough and shortness of breath.   Cardiovascular: Negative for chest pain, palpitations and leg swelling.  Gastrointestinal: Negative for abdominal pain, constipation and heartburn.  Musculoskeletal: Negative for back pain, joint pain and myalgias.  Skin: Negative.     Neurological: Negative for dizziness.  Psychiatric/Behavioral: The patient is not nervous/anxious.    Physical Exam Constitutional:      General: She is not in acute distress.    Appearance: She is well-developed. She is obese. She is not diaphoretic.  Neck:     Thyroid: No thyromegaly.  Cardiovascular:     Rate and Rhythm: Normal rate and regular rhythm.  Pulses: Normal pulses.     Heart sounds: Normal heart sounds.  Pulmonary:     Effort: Pulmonary effort is normal. No respiratory distress.     Breath sounds: Normal breath sounds.  Abdominal:     General: Bowel sounds are normal. There is no distension.     Palpations: Abdomen is soft.     Tenderness: There is no abdominal tenderness.  Musculoskeletal:     Cervical back: Neck supple.     Right lower leg: No edema.     Left lower leg: No edema.     Comments:  Is able to move all extremities Uses wheelchair  2016: left hip fracture left hip replacement   Lymphadenopathy:     Cervical: No cervical adenopathy.  Skin:    General: Skin is warm and dry.  Neurological:     Mental Status: She is alert. Mental status is at baseline.  Psychiatric:        Mood and Affect: Mood normal.      ASSESSMENT/ PLAN:  TODAY:   1. Hypokalemia: is stable k+ 3.7 will continue k+ 10 meq daily   2. Unspecified hyperlipidemia; is stable LDL 102 will continue mevacor 20 mg daily   3. Unspecified obsessive compulsive disorder: is stable will continue prozac 20 mg daily and melatonin 5 mg nightly   PREVIOUS  4. Chronic generalized pain: is stable will continue tylenol 1300 mg daily   5. Seizure disorder: is stable no reports of seizure activity will continue keppra 500 mg twice daily   6. Chronic deep vein thrombosis (DVT) popliteal vein unspecified laterality is stable will monitor   7. Essential hypertension is stable b/p 122/73 will continue hctz 12.5 mg daily   8. Dementia without behavioral disturbance is without change will  continue namenda 5 mg twice daily   9. Simple chronic bronchitis is stable will continue symbicort 80/4.5 mcg 2 puffs twice daily   10. Chronic constipation: is stable will continue colace 200 mg daily and senna 2 tabs daily miralax daily as needed.      MD is aware of resident's narcotic use and is in agreement with current plan of care. We will attempt to wean resident as appropriate.  Synthia Innocent NP [facility] Adult Medicine  Contact [phone] Monday through Friday 8am- 5pm  After hours call [phone]

## 2020-05-25 ENCOUNTER — Encounter: Payer: Self-pay | Admitting: Adult Health

## 2020-05-25 ENCOUNTER — Non-Acute Institutional Stay (SKILLED_NURSING_FACILITY): Payer: Medicare PPO | Admitting: Adult Health

## 2020-05-25 DIAGNOSIS — G8929 Other chronic pain: Secondary | ICD-10-CM | POA: Diagnosis not present

## 2020-05-25 DIAGNOSIS — R52 Pain, unspecified: Secondary | ICD-10-CM

## 2020-05-25 DIAGNOSIS — G40909 Epilepsy, unspecified, not intractable, without status epilepticus: Secondary | ICD-10-CM | POA: Diagnosis not present

## 2020-05-25 DIAGNOSIS — I82539 Chronic embolism and thrombosis of unspecified popliteal vein: Secondary | ICD-10-CM

## 2020-05-25 NOTE — Progress Notes (Signed)
Location:    [facility] Room Number: 116/D Place of Service:  SNF (31)   CODE STATUS: Full Code  No Known Allergies  Chief Complaint  Patient presents with  . Medical Management of Chronic Issues             Chronic generalized pain:   Seizure disorder:  Chronic deep vein thrombosis (DVT) popliteal vein unspecified laterality     HPI:  She is a 86 year old long term resident of this facility being seen for the management of her chronic illnesses:  Chronic generalized pain: Seizure disorder:   Chronic deep vein thrombosis (DVT) popliteal vein unspecified laterality . No reports of uncontrolled pain; no reports of agitation or anxiety; no reports of constipation or heart burn.   Past Medical History:  Diagnosis Date  . Diverticulosis OCT 2010 TCS   SIGMOID COLON  . Esophageal web 2010   DILATION 16 MM  . Gastritis, Helicobacter pylori 2010   Rx. ABO for 10 days   . GERD (gastroesophageal reflux disease)   . Hip fracture, left (HCC)    2016  . Hyperlipidemia   . Hypertension   . Restless legs syndrome   . Seizures [facility]) January 2010   Post head trauma in fall .sees Dr Gerilyn Pilgrim    Past Surgical History:  Procedure Laterality Date  . ABDOMINAL HYSTERECTOMY    . COLONOSCOPY  OCT 2010   TORTUOUS, Sml IH, Tics, MULTIPLE SIMPLE ADENOMAS (<6MM)  . HIP ARTHROPLASTY Left 11/19/2014   Procedure: LEFT PARTIAL HIP REPLACEMENT;  Surgeon: Vickki Hearing, MD;  Location: [facility];  Service: Orthopedics;  Laterality: Left;  . ORIF HIP FRACTURE  04/05/2012   Procedure: OPEN REDUCTION INTERNAL FIXATION HIP;  Surgeon: Vickki Hearing, MD;  Location: [facility];  Service: Orthopedics;  Laterality: Right;  . UPPER GASTROINTESTINAL ENDOSCOPY  AUG 2010   SAVARY  . VESICOVAGINAL FISTULA CLOSURE W/ TAH  1966   bleeding     Social History   Socioeconomic History  . Marital status: Widowed    Spouse name: Not on file  . Number of children: Not on file  . Years of  education: Not on file  . Highest education level: Not on file  Occupational History  . 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 agent   Tobacco Use  . Smoking status: Never Smoker  . Smokeless tobacco: Never Used  Substance and Sexual Activity  . Alcohol use: No  . Drug use: No  . Sexual activity: Never  Other Topics Concern  . Not on file  Social History Narrative  . Not on file   Social Determinants of Health   Financial Resource Strain:   . Difficulty of Paying Living Expenses:   Food Insecurity:   . Worried About Programme researcher, broadcasting/film/video in the Last Year:   . Barista in the Last Year:   Transportation Needs:   . Freight forwarder (Medical):   Marland Kitchen Lack of Transportation (Non-Medical):   Physical Activity:   . Days of Exercise per Week:   . Minutes of Exercise per Session:   Stress:   . Feeling of Stress :   Social Connections:   . Frequency of Communication with Friends and Family:   . Frequency of Social Gatherings with Friends and Family:   . Attends Religious Services:   . Active Member of Clubs or Organizations:   . Attends Banker Meetings:   Marland Kitchen Marital Status:  Intimate Partner Violence:   . Fear of Current or Ex-Partner:   . Emotionally Abused:   Marland Kitchen Physically Abused:   . Sexually Abused:    Family History  Problem Relation Age of Onset  . Cancer Mother        bladder   . Diabetes Mother   . Diabetes Father   . Emphysema Father   . GER disease Brother   . Colon cancer Neg Hx   . Colon polyps Neg Hx       VITAL SIGNS BP (!) 116/56   Pulse 93   Temp 98.4 F (36.9 C) (Oral)   Resp (!) 23   Ht 5\' 7"  (1.702 m)   Wt 251 lb 3.2 oz (113.9 kg)   SpO2 94%   BMI 39.34 kg/m   Outpatient Encounter Medications as of 05/25/2020  Medication Sig  . acetaminophen (TYLENOL) 325 MG tablet Take 650 mg by mouth every 4 (four) hours as needed.  05/27/2020 acetaminophen (TYLENOL) 650 MG CR tablet Take 1,300 mg by mouth daily.   Marland Kitchen aspirin  81 MG chewable tablet Chew 81 mg by mouth daily.  Marland Kitchen [place] [facility]) OINT Special Instructions: Apply to sacrum, coccyx and bilateral buttocks qshift for prevention.  . calcium-vitamin D (OSCAL WITH D) 500-200 MG-UNIT TABS TAKE (1) TABLET BY MOUTH (3) TIMES DAILY WITH MEALS.  [facility] FLUoxetine (PROZAC) 20 MG tablet Take 20 mg by mouth daily.   . hydrochlorothiazide (HYDRODIURIL) 12.5 MG tablet Take 12.5 mg by mouth daily.  Marland Kitchen ipratropium-albuterol (DUONEB) 0.5-2.5 (3) MG/3ML SOLN Take 3 mLs by nebulization every 6 (six) hours as needed.  . levETIRAcetam (KEPPRA) 500 MG tablet TAKE 1 TABLET BY MOUTH TWICE A DAY AS DIRECTED.  Marland Kitchen lovastatin (MEVACOR) 20 MG tablet Take 1 tablet (20 mg total) by mouth at bedtime.  . Melatonin 5 MG TABS Take 5 mg by mouth at bedtime.  . memantine (NAMENDA) 5 MG tablet Take 5 mg by mouth 2 (two) times daily.  . NON FORMULARY Diet type:  NAS  . polyethylene glycol (MIRALAX / GLYCOLAX) packet Take 17 g by mouth daily as needed.   . potassium chloride (K-DUR,KLOR-CON) 10 MEQ tablet Take 10 mEq by mouth daily.  Marland Kitchen senna (SENOKOT) 8.6 MG TABS tablet Take 2 tablets by mouth daily.   . SYMBICORT 80-4.5 MCG/ACT inhaler Inhale 2 puffs into the lungs 2 (two) times daily.    No facility-administered encounter medications on file as of 05/25/2020.     SIGNIFICANT DIAGNOSTIC EXAMS   PREVIOUS  09-07-19: DEXA: t score -1.06  NO NEW EXAMS.   LABS REVIEWED PREVIOUS  07-13-19: wbc 3.0; hgb 12.5; hct 38.6; mcv 92.3 plt 117; glucose 99; bun 15; creat 0.66; k+ 3.5; na++ 132; ca 8.1; liver normal albumin 3.5; chol 126; ldl 81; trig 41; hdl 37 02-04-20: wbc 4.8; hgb 12.8; hct 40.2; mcv 94.1 plt 170; glucose 88; bun 25; creat 0.72; k+ 3.7; na++ 141; ca 9.0; liver normal albumin 4.0 chol 167 ldl 102 trig 75 hdl 50  NO NEW LABS.   Review of Systems  Constitutional: Negative for malaise/fatigue.  Respiratory: Negative for cough and shortness of breath.   Cardiovascular:  Negative for chest pain, palpitations and leg swelling.  Gastrointestinal: Negative for abdominal pain, constipation and heartburn.  Musculoskeletal: Negative for back pain, joint pain and myalgias.  Skin: Negative.   Neurological: Negative for dizziness.  Psychiatric/Behavioral: The patient is not nervous/anxious.    .   Physical Exam Constitutional:  General: She is not in acute distress.    Appearance: She is well-developed. She is obese. She is not diaphoretic.  Neck:     Thyroid: No thyromegaly.  Cardiovascular:     Rate and Rhythm: Normal rate and regular rhythm.     Pulses: Normal pulses.     Heart sounds: Normal heart sounds.  Pulmonary:     Effort: Pulmonary effort is normal. No respiratory distress.     Breath sounds: Normal breath sounds.  Abdominal:     General: Bowel sounds are normal. There is no distension.     Palpations: Abdomen is soft.     Tenderness: There is no abdominal tenderness.  Musculoskeletal:     Cervical back: Neck supple.     Right lower leg: No edema.     Left lower leg: No edema.     Comments:  Is able to move all extremities Uses wheelchair  2016: left hip fracture left hip replacement    Lymphadenopathy:     Cervical: No cervical adenopathy.  Skin:    General: Skin is warm and dry.  Neurological:     Mental Status: She is alert. Mental status is at baseline.  Psychiatric:        Mood and Affect: Mood normal.      ASSESSMENT/ PLAN:  TODAY:   1. Chronic generalized pain: is stable will continue tylenol 1300 mg daily   2. Seizure disorder: is stable no reports of seizure activity will continue keppra 500 mg twice daily   3. Chronic deep vein thrombosis (DVT) popliteal vein unspecified laterality is stable will monitor   PREVIOUS  4. Essential hypertension is stable b/p 116/56 will continue hctz 12.5 mg daily   5. Dementia without behavioral disturbance is without change will continue namenda 5 mg twice daily   6. Simple  chronic bronchitis is stable will continue symbicort 80/4.5 mcg 2 puffs twice daily   7. Chronic constipation: is stable will continue colace 200 mg daily and senna 2 tabs daily miralax daily as needed.  8. Hypokalemia: is stable k+ 3.7 will continue k+ 10 meq daily   9. Unspecified hyperlipidemia; is stable LDL 102 will continue mevacor 20 mg daily   10. Unspecified obsessive compulsive disorder: is stable will continue prozac 20 mg daily and melatonin 5 mg nightly       MD is aware of resident's narcotic use and is in agreement with current plan of care. We will attempt to wean resident as appropriate.  Synthia Innocent NP [facility]  Contact [phone] Monday through Friday 8am- 5pm  After hours call [phone]

## 2020-06-03 ENCOUNTER — Encounter: Payer: Self-pay | Admitting: Adult Health

## 2020-06-03 ENCOUNTER — Non-Acute Institutional Stay: Payer: Self-pay | Admitting: Adult Health

## 2020-06-03 DIAGNOSIS — F429 Obsessive-compulsive disorder, unspecified: Secondary | ICD-10-CM

## 2020-06-03 DIAGNOSIS — G301 Alzheimer's disease with late onset: Secondary | ICD-10-CM

## 2020-06-03 DIAGNOSIS — F418 Other specified anxiety disorders: Secondary | ICD-10-CM

## 2020-06-08 NOTE — Progress Notes (Signed)
Location:   [facility] Room Number: 116/D Place of Service:  SNF (31)   CODE STATUS: full code   No Known Allergies  Chief Complaint  Patient presents with  . Acute Visit    medication review     HPI:  She is presently taking prozac 20 mg daily. She has failed one wean. There are no reports of anxiety or depressive thoughts. There are no reports of insomnia. She is socializing with others staff and other residents. She is emotionally stable on her current regimen.   Past Medical History:  Diagnosis Date  . Diverticulosis OCT 2010 TCS   SIGMOID COLON  . Esophageal web 2010   DILATION 16 MM  . Gastritis, Helicobacter pylori 2010   Rx. ABO for 10 days   . GERD (gastroesophageal reflux disease)   . Hip fracture, left (HCC)    2016  . Hyperlipidemia   . Hypertension   . Restless legs syndrome   . Seizures [facility]) January 2010   Post head trauma in fall .sees Dr Gerilyn Pilgrim    Past Surgical History:  Procedure Laterality Date  . ABDOMINAL HYSTERECTOMY    . COLONOSCOPY  OCT 2010   TORTUOUS, Sml IH, Tics, MULTIPLE SIMPLE ADENOMAS (<6MM)  . HIP ARTHROPLASTY Left 11/19/2014   Procedure: LEFT PARTIAL HIP REPLACEMENT;  Surgeon: Vickki Hearing, MD;  Location: [facility];  Service: Orthopedics;  Laterality: Left;  . ORIF HIP FRACTURE  04/05/2012   Procedure: OPEN REDUCTION INTERNAL FIXATION HIP;  Surgeon: Vickki Hearing, MD;  Location: [facility];  Service: Orthopedics;  Laterality: Right;  . UPPER GASTROINTESTINAL ENDOSCOPY  AUG 2010   SAVARY  . VESICOVAGINAL FISTULA CLOSURE W/ TAH  1966   bleeding     Social History   Socioeconomic History  . Marital status: Widowed    Spouse name: Not on file  . Number of children: Not on file  . Years of education: Not on file  . Highest education level: Not on file  Occupational History  . 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 agent   Tobacco Use  . Smoking status: Never Smoker  . Smokeless tobacco: Never Used    Substance and Sexual Activity  . Alcohol use: No  . Drug use: No  . Sexual activity: Never  Other Topics Concern  . Not on file  Social History Narrative  . Not on file   Social Determinants of Health   Financial Resource Strain:   . Difficulty of Paying Living Expenses: Not on file  Food Insecurity:   . Worried About Programme researcher, broadcasting/film/video in the Last Year: Not on file  . Ran Out of Food in the Last Year: Not on file  Transportation Needs:   . Lack of Transportation (Medical): Not on file  . Lack of Transportation (Non-Medical): Not on file  Physical Activity:   . Days of Exercise per Week: Not on file  . Minutes of Exercise per Session: Not on file  Stress:   . Feeling of Stress : Not on file  Social Connections:   . Frequency of Communication with Friends and Family: Not on file  . Frequency of Social Gatherings with Friends and Family: Not on file  . Attends Religious Services: Not on file  . Active Member of Clubs or Organizations: Not on file  . Attends Banker Meetings: Not on file  . Marital Status: Not on file  Intimate Partner Violence:   . Fear of Current  or Ex-Partner: Not on file  . Emotionally Abused: Not on file  . Physically Abused: Not on file  . Sexually Abused: Not on file   Family History  Problem Relation Age of Onset  . Cancer Mother        bladder   . Diabetes Mother   . Diabetes Father   . Emphysema Father   . GER disease Brother   . Colon cancer Neg Hx   . Colon polyps Neg Hx       VITAL SIGNS BP (!) 143/78   Pulse 63   Temp 98.4 F (36.9 C)   Resp 20   Ht 5\' 7"  (1.702 m)   Wt 251 lb 3.2 oz (113.9 kg)   SpO2 94%   BMI 39.34 kg/m   Outpatient Encounter Medications as of 06/03/2020  Medication Sig  . acetaminophen (TYLENOL) 325 MG tablet Take 650 mg by mouth every 4 (four) hours as needed.  06/05/2020 acetaminophen (TYLENOL) 650 MG CR tablet Take 1,300 mg by mouth daily.   Marland Kitchen aspirin 81 MG chewable tablet Chew 81 mg by mouth  daily.  Marland Kitchen [place] [facility]) OINT Special Instructions: Apply to sacrum, coccyx and bilateral buttocks qshift for prevention.  . calcium-vitamin D (OSCAL WITH D) 500-200 MG-UNIT TABS TAKE (1) TABLET BY MOUTH (3) TIMES DAILY WITH MEALS.  [facility] FLUoxetine (PROZAC) 20 MG tablet Take 20 mg by mouth daily.   . hydrochlorothiazide (HYDRODIURIL) 12.5 MG tablet Take 12.5 mg by mouth daily.  Marland Kitchen ipratropium-albuterol (DUONEB) 0.5-2.5 (3) MG/3ML SOLN Take 3 mLs by nebulization every 6 (six) hours as needed.  . levETIRAcetam (KEPPRA) 500 MG tablet TAKE 1 TABLET BY MOUTH TWICE A DAY AS DIRECTED.  Marland Kitchen lovastatin (MEVACOR) 20 MG tablet Take 1 tablet (20 mg total) by mouth at bedtime.  . Melatonin 5 MG TABS Take 5 mg by mouth at bedtime.  . memantine (NAMENDA) 5 MG tablet Take 5 mg by mouth 2 (two) times daily.  . NON FORMULARY Diet type:  NAS  . polyethylene glycol (MIRALAX / GLYCOLAX) packet Take 17 g by mouth daily as needed.   . potassium chloride (K-DUR,KLOR-CON) 10 MEQ tablet Take 10 mEq by mouth daily.  Marland Kitchen senna (SENOKOT) 8.6 MG TABS tablet Take 2 tablets by mouth daily.   . SYMBICORT 80-4.5 MCG/ACT inhaler Inhale 2 puffs into the lungs 2 (two) times daily.    No facility-administered encounter medications on file as of 06/03/2020.     SIGNIFICANT DIAGNOSTIC EXAMS   PREVIOUS  09-07-19: DEXA: t score -1.06  NO NEW EXAMS.   LABS REVIEWED PREVIOUS  07-13-19: wbc 3.0; hgb 12.5; hct 38.6; mcv 92.3 plt 117; glucose 99; bun 15; creat 0.66; k+ 3.5; na++ 132; ca 8.1; liver normal albumin 3.5; chol 126; ldl 81; trig 41; hdl 37 02-04-20: wbc 4.8; hgb 12.8; hct 40.2; mcv 94.1 plt 170; glucose 88; bun 25; creat 0.72; k+ 3.7; na++ 141; ca 9.0; liver normal albumin 4.0 chol 167 ldl 102 trig 75 hdl 50  NO NEW LABS.   Review of Systems  Constitutional: Negative for malaise/fatigue.  Respiratory: Negative for cough and shortness of breath.   Cardiovascular: Negative for chest pain, palpitations and  leg swelling.  Gastrointestinal: Negative for abdominal pain, constipation and heartburn.  Musculoskeletal: Negative for back pain, joint pain and myalgias.  Skin: Negative.   Neurological: Negative for dizziness.  Psychiatric/Behavioral: The patient is not nervous/anxious.    Physical Exam Constitutional:      General:  She is not in acute distress.    Appearance: She is well-developed. She is obese. She is not diaphoretic.  Neck:     Thyroid: No thyromegaly.  Cardiovascular:     Rate and Rhythm: Normal rate and regular rhythm.     Pulses: Normal pulses.     Heart sounds: Normal heart sounds.  Pulmonary:     Effort: Pulmonary effort is normal. No respiratory distress.     Breath sounds: Normal breath sounds.  Abdominal:     General: Bowel sounds are normal. There is no distension.     Palpations: Abdomen is soft.     Tenderness: There is no abdominal tenderness.  Musculoskeletal:     Cervical back: Neck supple.     Right lower leg: No edema.     Left lower leg: No edema.     Comments: Is able to move all extremities Uses wheelchair  2016: left hip fracture left hip replacement     Lymphadenopathy:     Cervical: No cervical adenopathy.  Skin:    General: Skin is warm and dry.  Neurological:     Mental Status: She is alert. Mental status is at baseline.  Psychiatric:        Mood and Affect: Mood normal.       ASSESSMENT/ PLAN:  TODAY  1. Late onset alzheimer's disease with behavioral disturbance 2. Obsessive compulsive disorder 3.  Depression with anxiety  She is emotionally stable on prozac 20 mg daily (has failed one wean) lowering her medication at this time could cause her emotional harm; will continue to monitor her status and will make further changes in the future as indicated.   MD is aware of resident's narcotic use and is in agreement with current plan of care. We will attempt to wean resident as appropriate.  Synthia Innocent NP [facility]    Contact [phone] Monday through Friday 8am- 5pm  After hours call [phone]

## 2020-06-30 ENCOUNTER — Non-Acute Institutional Stay (SKILLED_NURSING_FACILITY): Payer: Medicare PPO | Admitting: Adult Health

## 2020-06-30 ENCOUNTER — Encounter: Payer: Self-pay | Admitting: Adult Health

## 2020-06-30 DIAGNOSIS — J410 Simple chronic bronchitis: Secondary | ICD-10-CM

## 2020-06-30 DIAGNOSIS — F0281 Dementia in other diseases classified elsewhere with behavioral disturbance: Secondary | ICD-10-CM

## 2020-06-30 DIAGNOSIS — G301 Alzheimer's disease with late onset: Secondary | ICD-10-CM | POA: Diagnosis not present

## 2020-06-30 DIAGNOSIS — I10 Essential (primary) hypertension: Secondary | ICD-10-CM | POA: Diagnosis not present

## 2020-06-30 DIAGNOSIS — F02818 Dementia in other diseases classified elsewhere, unspecified severity, with other behavioral disturbance: Secondary | ICD-10-CM

## 2020-06-30 NOTE — Progress Notes (Signed)
Location:    [facility] Room Number: 116/D Place of Service:  SNF (31)   CODE STATUS: Full Code  No Known Allergies  Chief Complaint  Patient presents with  . Medical Management of Chronic Issues             Essential hypertension:   Late onset alzheimer's disease without behavioral disturbance :    Simple chronic bronchitis:    HPI:  She is a 86 year old long term resident of this facility being seen for the management of her chronic illnesses:Essential hypertension:   Late onset alzheimer's disease without behavioral disturbance:    Simple chronic bronchitis. There are no reports of cough or shortness of breath. No reports of uncontrolled pain; no reports of agitation or anxiety    Past Medical History:  Diagnosis Date  . Diverticulosis OCT 2010 TCS   SIGMOID COLON  . Esophageal web 2010   DILATION 16 MM  . Gastritis, Helicobacter pylori 2010   Rx. ABO for 10 days   . GERD (gastroesophageal reflux disease)   . Hip fracture, left (HCC)    2016  . Hyperlipidemia   . Hypertension   . Restless legs syndrome   . Seizures [facility]) January 2010   Post head trauma in fall .sees Dr Gerilyn Pilgrim    Past Surgical History:  Procedure Laterality Date  . ABDOMINAL HYSTERECTOMY    . COLONOSCOPY  OCT 2010   TORTUOUS, Sml IH, Tics, MULTIPLE SIMPLE ADENOMAS (<6MM)  . HIP ARTHROPLASTY Left 11/19/2014   Procedure: LEFT PARTIAL HIP REPLACEMENT;  Surgeon: Vickki Hearing, MD;  Location: [facility];  Service: Orthopedics;  Laterality: Left;  . ORIF HIP FRACTURE  04/05/2012   Procedure: OPEN REDUCTION INTERNAL FIXATION HIP;  Surgeon: Vickki Hearing, MD;  Location: [facility];  Service: Orthopedics;  Laterality: Right;  . UPPER GASTROINTESTINAL ENDOSCOPY  AUG 2010   SAVARY  . VESICOVAGINAL FISTULA CLOSURE W/ TAH  1966   bleeding     Social History   Socioeconomic History  . Marital status: Widowed    Spouse name: Not on file  . Number of children: Not on file  . Years  of education: Not on file  . Highest education level: Not on file  Occupational History  . 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 agent   Tobacco Use  . Smoking status: Never Smoker  . Smokeless tobacco: Never Used  Substance and Sexual Activity  . Alcohol use: No  . Drug use: No  . Sexual activity: Never  Other Topics Concern  . Not on file  Social History Narrative  . Not on file   Social Determinants of Health   Financial Resource Strain:   . Difficulty of Paying Living Expenses: Not on file  Food Insecurity:   . Worried About Programme researcher, broadcasting/film/video in the Last Year: Not on file  . Ran Out of Food in the Last Year: Not on file  Transportation Needs:   . Lack of Transportation (Medical): Not on file  . Lack of Transportation (Non-Medical): Not on file  Physical Activity:   . Days of Exercise per Week: Not on file  . Minutes of Exercise per Session: Not on file  Stress:   . Feeling of Stress : Not on file  Social Connections:   . Frequency of Communication with Friends and Family: Not on file  . Frequency of Social Gatherings with Friends and Family: Not on file  . Attends Religious Services: Not  on file  . Active Member of Clubs or Organizations: Not on file  . Attends Banker Meetings: Not on file  . Marital Status: Not on file  Intimate Partner Violence:   . Fear of Current or Ex-Partner: Not on file  . Emotionally Abused: Not on file  . Physically Abused: Not on file  . Sexually Abused: Not on file   Family History  Problem Relation Age of Onset  . Cancer Mother        bladder   . Diabetes Mother   . Diabetes Father   . Emphysema Father   . GER disease Brother   . Colon cancer Neg Hx   . Colon polyps Neg Hx       VITAL SIGNS BP (!) 118/58   Pulse 83   Temp 99 F (37.2 C)   Ht 5\' 7"  (1.702 m)   Wt 249 lb (112.9 kg)   SpO2 97%   BMI 39.00 kg/m   Outpatient Encounter Medications as of 06/30/2020  Medication Sig  .  acetaminophen (TYLENOL) 325 MG tablet Take 650 mg by mouth every 4 (four) hours as needed.  07/02/2020 acetaminophen (TYLENOL) 650 MG CR tablet Take 1,300 mg by mouth daily.   Marland Kitchen aspirin 81 MG chewable tablet Chew 81 mg by mouth daily.  Marland Kitchen [place]-Castor Oil [facility]) OINT Special Instructions: Apply to sacrum, coccyx and bilateral buttocks qshift for prevention.  . calcium-vitamin D (OSCAL WITH D) 500-200 MG-UNIT TABS TAKE (1) TABLET BY MOUTH (3) TIMES DAILY WITH MEALS.  [facility] FLUoxetine (PROZAC) 20 MG tablet Take 20 mg by mouth daily.   . hydrochlorothiazide (HYDRODIURIL) 12.5 MG tablet Take 12.5 mg by mouth daily.  Marland Kitchen ipratropium-albuterol (DUONEB) 0.5-2.5 (3) MG/3ML SOLN Take 3 mLs by nebulization every 6 (six) hours as needed.  . levETIRAcetam (KEPPRA) 500 MG tablet TAKE 1 TABLET BY MOUTH TWICE A DAY AS DIRECTED.  Marland Kitchen lovastatin (MEVACOR) 20 MG tablet Take 1 tablet (20 mg total) by mouth at bedtime.  . Melatonin 5 MG TABS Take 5 mg by mouth at bedtime.  . memantine (NAMENDA) 5 MG tablet Take 5 mg by mouth 2 (two) times daily.  . NON FORMULARY Diet type:  NAS  . polyethylene glycol (MIRALAX / GLYCOLAX) packet Take 17 g by mouth daily as needed.   . potassium chloride (K-DUR,KLOR-CON) 10 MEQ tablet Take 10 mEq by mouth daily.  Marland Kitchen senna (SENOKOT) 8.6 MG TABS tablet Take 2 tablets by mouth daily.   . sennosides-docusate sodium (SENOKOT-S) 8.6-50 MG tablet Take 1 tablet by mouth daily. For Constipation  . SYMBICORT 80-4.5 MCG/ACT inhaler Inhale 2 puffs into the lungs 2 (two) times daily.    No facility-administered encounter medications on file as of 06/30/2020.     SIGNIFICANT DIAGNOSTIC EXAMS   PREVIOUS  09-07-19: DEXA: t score -1.06  NO NEW EXAMS.   LABS REVIEWED PREVIOUS  07-13-19: wbc 3.0; hgb 12.5; hct 38.6; mcv 92.3 plt 117; glucose 99; bun 15; creat 0.66; k+ 3.5; na++ 132; ca 8.1; liver normal albumin 3.5; chol 126; ldl 81; trig 41; hdl 37 02-04-20: wbc 4.8; hgb 12.8; hct 40.2; mcv 94.1 plt  170; glucose 88; bun 25; creat 0.72; k+ 3.7; na++ 141; ca 9.0; liver normal albumin 4.0 chol 167 ldl 102 trig 75 hdl 50  NO NEW LABS.   Review of Systems  Constitutional: Negative for malaise/fatigue.  Respiratory: Negative for cough and shortness of breath.   Cardiovascular: Negative for chest pain, palpitations  and leg swelling.  Gastrointestinal: Negative for abdominal pain, constipation and heartburn.  Musculoskeletal: Negative for back pain, joint pain and myalgias.  Skin: Negative.   Neurological: Negative for dizziness.  Psychiatric/Behavioral: The patient is not nervous/anxious.       Physical Exam Constitutional:      General: She is not in acute distress.    Appearance: She is well-developed. She is obese. She is not diaphoretic.  Neck:     Thyroid: No thyromegaly.  Cardiovascular:     Rate and Rhythm: Normal rate and regular rhythm.     Pulses: Normal pulses.     Heart sounds: Normal heart sounds.  Pulmonary:     Effort: Pulmonary effort is normal. No respiratory distress.     Breath sounds: Normal breath sounds.  Abdominal:     General: Bowel sounds are normal. There is no distension.     Palpations: Abdomen is soft.     Tenderness: There is no abdominal tenderness.  Musculoskeletal:     Cervical back: Neck supple.     Right lower leg: No edema.     Left lower leg: No edema.     Comments:  Is able to move all extremities Uses wheelchair  2016: left hip fracture left hip replacement      Lymphadenopathy:     Cervical: No cervical adenopathy.  Skin:    General: Skin is warm and dry.  Neurological:     Mental Status: She is alert. Mental status is at baseline.  Psychiatric:        Mood and Affect: Mood normal.      ASSESSMENT/ PLAN:  TODAY:   1. Essential hypertension: is stable b/p 118/58 will continue hctz 12.5 mg daily   2. Late onset alzheimer's disease without behavioral disturbance:  is without change; weight is 249 pounds will continue namenda  5 mg twice daily  3. Simple chronic bronchitis: is stable will continue symbicort 80/4.5 mcg 2 puffs twice daily    PREVIOUS  4. Chronic constipation: is stable will continue colace 200 mg daily and senna 2 tabs daily miralax daily as needed.  5. Hypokalemia: is stable k+ 3.7 will continue k+ 10 meq daily   6. Unspecified hyperlipidemia; is stable LDL 102 will continue mevacor 20 mg daily   7. Unspecified obsessive compulsive disorder: is stable will continue prozac 20 mg daily and melatonin 5 mg nightly   8. Chronic generalized pain: is stable will continue tylenol 1300 mg daily   9. Seizure disorder: is stable no reports of seizure activity will continue keppra 500 mg twice daily   10. Chronic deep vein thrombosis (DVT) popliteal vein unspecified laterality is stable will monitor      MD is aware of resident's narcotic use and is in agreement with current plan of care. We will attempt to wean resident as appropriate.  Synthia Innocent NP [facility]  Contact [phone] Monday through Friday 8am- 5pm  After hours call [phone]

## 2020-07-21 ENCOUNTER — Non-Acute Institutional Stay (SKILLED_NURSING_FACILITY): Payer: Medicare PPO | Admitting: Adult Health

## 2020-07-21 ENCOUNTER — Encounter: Payer: Self-pay | Admitting: Adult Health

## 2020-07-21 DIAGNOSIS — F0281 Dementia in other diseases classified elsewhere with behavioral disturbance: Secondary | ICD-10-CM

## 2020-07-21 DIAGNOSIS — G301 Alzheimer's disease with late onset: Secondary | ICD-10-CM

## 2020-07-21 DIAGNOSIS — J410 Simple chronic bronchitis: Secondary | ICD-10-CM | POA: Diagnosis not present

## 2020-07-21 DIAGNOSIS — G40909 Epilepsy, unspecified, not intractable, without status epilepticus: Secondary | ICD-10-CM | POA: Diagnosis not present

## 2020-07-21 DIAGNOSIS — F02818 Dementia in other diseases classified elsewhere, unspecified severity, with other behavioral disturbance: Secondary | ICD-10-CM

## 2020-07-21 NOTE — Progress Notes (Signed)
Location:    [facility] Room Number: 04/08/2012 Place of Service:  SNF (31)   CODE STATUS: Full Code  No Known Allergies  Chief Complaint  Patient presents with  . Acute Visit    Care Plan Meeting    HPI:  We have come together for her care plan meeting. BIMS 9/15 mood 6/30. She is depressed over the loss of her brother nearly one year ago. She has had a fall on 06-15-20 without injury. Her weight is 252.8 pounds is up 8 pounds in the past quarter. She requires extensive assist with adls; is able to feed self after setup. She is frequently incontinent of bladder and bowel. There are no reports of uncontrolled pain. She continues to be followed for her chronic illnesses: including:Chronic simple bronchitis Late onset alzheimer's disease without behavioral disturbance Seizure disorder   Past Medical History:  Diagnosis Date  . Diverticulosis OCT 2010 TCS   SIGMOID COLON  . Esophageal web 2010   DILATION 16 MM  . Gastritis, Helicobacter pylori 2010   Rx. ABO for 10 days   . GERD (gastroesophageal reflux disease)   . Hip fracture, left (HCC)    2016  . Hyperlipidemia   . Hypertension   . Restless legs syndrome   . Seizures [facility]) January 2010   Post head trauma in fall .sees Dr Gerilyn Pilgrim    Past Surgical History:  Procedure Laterality Date  . ABDOMINAL HYSTERECTOMY    . COLONOSCOPY  OCT 2010   TORTUOUS, Sml IH, Tics, MULTIPLE SIMPLE ADENOMAS (<6MM)  . HIP ARTHROPLASTY Left 11/19/2014   Procedure: LEFT PARTIAL HIP REPLACEMENT;  Surgeon: Vickki Hearing, MD;  Location: [facility];  Service: Orthopedics;  Laterality: Left;  . ORIF HIP FRACTURE  04/05/2012   Procedure: OPEN REDUCTION INTERNAL FIXATION HIP;  Surgeon: Vickki Hearing, MD;  Location: [facility];  Service: Orthopedics;  Laterality: Right;  . UPPER GASTROINTESTINAL ENDOSCOPY  AUG 2010   SAVARY  . VESICOVAGINAL FISTULA CLOSURE W/ TAH  1966   bleeding     Social History   Socioeconomic History  .  Marital status: Widowed    Spouse name: Not on file  . Number of children: Not on file  . Years of education: Not on file  . Highest education level: Not on file  Occupational History  . 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 agent   Tobacco Use  . Smoking status: Never Smoker  . Smokeless tobacco: Never Used  Substance and Sexual Activity  . Alcohol use: No  . Drug use: No  . Sexual activity: Never  Other Topics Concern  . Not on file  Social History Narrative  . Not on file   Social Determinants of Health   Financial Resource Strain:   . Difficulty of Paying Living Expenses: Not on file  Food Insecurity:   . Worried About Programme researcher, broadcasting/film/video in the Last Year: Not on file  . Ran Out of Food in the Last Year: Not on file  Transportation Needs:   . Lack of Transportation (Medical): Not on file  . Lack of Transportation (Non-Medical): Not on file  Physical Activity:   . Days of Exercise per Week: Not on file  . Minutes of Exercise per Session: Not on file  Stress:   . Feeling of Stress : Not on file  Social Connections:   . Frequency of Communication with Friends and Family: Not on file  . Frequency of Social Gatherings with Friends  and Family: Not on file  . Attends Religious Services: Not on file  . Active Member of Clubs or Organizations: Not on file  . Attends Banker Meetings: Not on file  . Marital Status: Not on file  Intimate Partner Violence:   . Fear of Current or Ex-Partner: Not on file  . Emotionally Abused: Not on file  . Physically Abused: Not on file  . Sexually Abused: Not on file   Family History  Problem Relation Age of Onset  . Cancer Mother        bladder   . Diabetes Mother   . Diabetes Father   . Emphysema Father   . GER disease Brother   . Colon cancer Neg Hx   . Colon polyps Neg Hx       VITAL SIGNS BP 122/66   Pulse 60   Temp 99.1 F (37.3 C)   Resp 18   Ht 5\' 7"  (1.702 m)   Wt 252 lb 12.8 oz (114.7  kg)   SpO2 97%   BMI 39.59 kg/m   Outpatient Encounter Medications as of 07/21/2020  Medication Sig  . acetaminophen (TYLENOL) 325 MG tablet Take 650 mg by mouth every 4 (four) hours as needed.  07/23/2020 acetaminophen (TYLENOL) 650 MG CR tablet Take 1,300 mg by mouth daily.   Marland Kitchen aspirin 81 MG chewable tablet Chew 81 mg by mouth daily.  Marland Kitchen [place]-Castor Oil [facility]) OINT Special Instructions: Apply to sacrum, coccyx and bilateral buttocks qshift for prevention.  . calcium-vitamin D (OSCAL WITH D) 500-200 MG-UNIT TABS TAKE (1) TABLET BY MOUTH (3) TIMES DAILY WITH MEALS.  [facility] FLUoxetine (PROZAC) 20 MG tablet Take 20 mg by mouth daily.   . hydrochlorothiazide (HYDRODIURIL) 12.5 MG tablet Take 12.5 mg by mouth daily.  Marland Kitchen ipratropium-albuterol (DUONEB) 0.5-2.5 (3) MG/3ML SOLN Take 3 mLs by nebulization every 6 (six) hours as needed.  . levETIRAcetam (KEPPRA) 500 MG tablet TAKE 1 TABLET BY MOUTH TWICE A DAY AS DIRECTED.  Marland Kitchen lovastatin (MEVACOR) 20 MG tablet Take 1 tablet (20 mg total) by mouth at bedtime.  . Melatonin 5 MG TABS Take 5 mg by mouth at bedtime.  . memantine (NAMENDA) 5 MG tablet Take 5 mg by mouth 2 (two) times daily.  . NON FORMULARY Diet type:  NAS  . polyethylene glycol (MIRALAX / GLYCOLAX) packet Take 17 g by mouth daily as needed.   . potassium chloride (K-DUR,KLOR-CON) 10 MEQ tablet Take 10 mEq by mouth daily.  Marland Kitchen senna (SENOKOT) 8.6 MG TABS tablet Take 1 tablet by mouth at bedtime. For Constipation  . sennosides-docusate sodium (SENOKOT-S) 8.6-50 MG tablet Take 2 tablets by mouth daily. For Constipation   . SYMBICORT 80-4.5 MCG/ACT inhaler Inhale 2 puffs into the lungs 2 (two) times daily.    No facility-administered encounter medications on file as of 07/21/2020.     SIGNIFICANT DIAGNOSTIC EXAMS  PREVIOUS  09-07-19: DEXA: t score -1.06  NO NEW EXAMS.   LABS REVIEWED PREVIOUS  07-13-19: wbc 3.0; hgb 12.5; hct 38.6; mcv 92.3 plt 117; glucose 99; bun 15; creat 0.66; k+ 3.5;  na++ 132; ca 8.1; liver normal albumin 3.5; chol 126; ldl 81; trig 41; hdl 37 02-04-20: wbc 4.8; hgb 12.8; hct 40.2; mcv 94.1 plt 170; glucose 88; bun 25; creat 0.72; k+ 3.7; na++ 141; ca 9.0; liver normal albumin 4.0 chol 167 ldl 102 trig 75 hdl 50  NO NEW LABS.    Review of Systems  Constitutional: Negative  for malaise/fatigue.  Respiratory: Negative for cough and shortness of breath.   Cardiovascular: Negative for chest pain, palpitations and leg swelling.  Gastrointestinal: Negative for abdominal pain, constipation and heartburn.  Musculoskeletal: Negative for back pain, joint pain and myalgias.  Skin: Negative.   Neurological: Negative for dizziness.  Psychiatric/Behavioral: The patient is not nervous/anxious.     Physical Exam Constitutional:      General: She is not in acute distress.    Appearance: She is well-developed. She is morbidly obese. She is not diaphoretic.  Neck:     Thyroid: No thyromegaly.  Cardiovascular:     Rate and Rhythm: Normal rate and regular rhythm.     Pulses: Normal pulses.     Heart sounds: Normal heart sounds.  Pulmonary:     Effort: Pulmonary effort is normal. No respiratory distress.     Breath sounds: Normal breath sounds.  Abdominal:     General: Bowel sounds are normal. There is no distension.     Palpations: Abdomen is soft.     Tenderness: There is no abdominal tenderness.  Musculoskeletal:     Cervical back: Neck supple.     Right lower leg: No edema.     Left lower leg: No edema.     Comments: Is able to move all extremities Uses wheelchair  2016: left hip fracture left hip replacement       Lymphadenopathy:     Cervical: No cervical adenopathy.  Skin:    General: Skin is warm and dry.  Neurological:     Mental Status: She is alert. Mental status is at baseline.  Psychiatric:        Mood and Affect: Mood normal.       ASSESSMENT/ PLAN:  TODAY  1. Chronic simple bronchitis 2. Late onset alzheimer's disease without  behavioral disturbance 3. Seizure disorder  Will get cbc; cmp; lipids Will continue current medications Will continue current plan of care Will continue to monitor her status.   MD is aware of resident's narcotic use and is in agreement with current plan of care. We will attempt to wean resident as appropriate.  Synthia Innocent NP [facility]  Contact [phone] Monday through Friday 8am- 5pm  After hours call [phone]

## 2020-07-25 ENCOUNTER — Encounter (HOSPITAL_COMMUNITY)
Admission: RE | Admit: 2020-07-25 | Discharge: 2020-07-25 | Disposition: A | Discharge status: Home / Self Care | Payer: Medicare PPO | Source: Skilled Nursing Facility | Attending: Adult Health | Admitting: Adult Health

## 2020-07-25 DIAGNOSIS — I10 Essential (primary) hypertension: Secondary | ICD-10-CM | POA: Diagnosis present

## 2020-07-25 LAB — CBC
HCT: 38.5 % (ref 36.0–46.0)
Hemoglobin: 12.1 g/dL (ref 12.0–15.0)
MCH: 29.9 pg (ref 26.0–34.0)
MCHC: 31.4 g/dL (ref 30.0–36.0)
MCV: 95.1 fL (ref 80.0–100.0)
Platelets: 142 10*3/uL — ABNORMAL LOW (ref 150–400)
RBC: 4.05 MIL/uL (ref 3.87–5.11)
RDW: 14.1 % (ref 11.5–15.5)
WBC: 4.9 10*3/uL (ref 4.0–10.5)
nRBC: 0 % (ref 0.0–0.2)

## 2020-07-25 LAB — COMPREHENSIVE METABOLIC PANEL
ALT: 15 U/L (ref 0–44)
AST: 19 U/L (ref 15–41)
Albumin: 3.5 g/dL (ref 3.5–5.0)
Alkaline Phosphatase: 59 U/L (ref 38–126)
Anion gap: 9 (ref 5–15)
BUN: 20 mg/dL (ref 8–23)
CO2: 30 mmol/L (ref 22–32)
Calcium: 8.4 mg/dL — ABNORMAL LOW (ref 8.9–10.3)
Chloride: 103 mmol/L (ref 98–111)
Creatinine, Ser: 0.7 mg/dL (ref 0.44–1.00)
GFR, Estimated: >60 mL/min (ref >60)
Glucose, Bld: 85 mg/dL (ref 70–99)
Potassium: 3.9 mmol/L (ref 3.5–5.1)
Sodium: 142 mmol/L (ref 135–145)
Total Bilirubin: 0.5 mg/dL (ref 0.3–1.2)
Total Protein: 6.3 g/dL — ABNORMAL LOW (ref 6.5–8.1)

## 2020-07-25 LAB — LIPID PANEL
Cholesterol: 141 mg/dL (ref 0–200)
HDL: 42 mg/dL (ref >40)
LDL Cholesterol: 87 mg/dL (ref 0–99)
Total CHOL/HDL Ratio: 3.4 RATIO
Triglycerides: 61 mg/dL (ref <150)
VLDL: 12 mg/dL (ref 0–40)

## 2020-08-02 ENCOUNTER — Non-Acute Institutional Stay (SKILLED_NURSING_FACILITY): Payer: Medicare PPO | Admitting: Adult Health

## 2020-08-02 ENCOUNTER — Encounter: Payer: Self-pay | Admitting: Adult Health

## 2020-08-02 DIAGNOSIS — E876 Hypokalemia: Secondary | ICD-10-CM

## 2020-08-02 DIAGNOSIS — E785 Hyperlipidemia, unspecified: Secondary | ICD-10-CM

## 2020-08-02 DIAGNOSIS — K5909 Other constipation: Secondary | ICD-10-CM

## 2020-08-02 NOTE — Progress Notes (Signed)
Location:    [facility] Room Number: 116/D Place of Service:  SNF (31)   CODE STATUS: Full Code  No Known Allergies  Chief Complaint  Patient presents with   Medical Management of Chronic Issues           Chronic constipation;    Hypokalemia:  Unspecified hyperlipidemia:    HPI:  She is a 86 year old long term resident of this facility being seen for the management her chronic illnesses: Chronic constipation;    Hypokalemia:  Unspecified hyperlipidemia. There are no reports of uncontrolled pain; no reports of constipation; no reports of heart burn; no reports of agitation or anxiety.   Past Medical History:  Diagnosis Date   Diverticulosis OCT 2010 TCS   SIGMOID COLON   Esophageal web 2010   DILATION 16 MM   Gastritis, Helicobacter pylori 2010   Rx. ABO for 10 days    GERD (gastroesophageal reflux disease)    Hip fracture, left (HCC)    2016   Hyperlipidemia    Hypertension    Restless legs syndrome    Seizures (HCC) January 2010   Post head trauma in fall .sees Dr Gerilyn Pilgrim    Past Surgical History:  Procedure Laterality Date   ABDOMINAL HYSTERECTOMY     COLONOSCOPY  OCT 2010   TORTUOUS, Sml IH, Tics, MULTIPLE SIMPLE ADENOMAS (<6MM)   HIP ARTHROPLASTY Left 11/19/2014   Procedure: LEFT PARTIAL HIP REPLACEMENT;  Surgeon: Vickki Hearing, MD;  Location: [facility];  Service: Orthopedics;  Laterality: Left;   ORIF HIP FRACTURE  04/05/2012   Procedure: OPEN REDUCTION INTERNAL FIXATION HIP;  Surgeon: Vickki Hearing, MD;  Location: [facility];  Service: Orthopedics;  Laterality: Right;   UPPER GASTROINTESTINAL ENDOSCOPY  AUG 2010   SAVARY   VESICOVAGINAL FISTULA CLOSURE W/ TAH  1966   bleeding     Social History   Socioeconomic History   Marital status: Widowed    Spouse name: Not on file   Number of children: Not on file   Years of education: Not on file   Highest education level: Not on file  Occupational History    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 agent   Tobacco Use   Smoking status: Never Smoker   Smokeless tobacco: Never Used  Substance and Sexual Activity   Alcohol use: No   Drug use: No   Sexual activity: Never  Other Topics Concern   Not on file  Social History Narrative   Not on file   Social Determinants of Health   Financial Resource Strain:    Difficulty of Paying Living Expenses: Not on file  Food Insecurity:    Worried About Programme researcher, broadcasting/film/video in the Last Year: Not on file   [employer] of Food in the Last Year: Not on file  Transportation Needs:    Lack of Transportation (Medical): Not on file   Lack of Transportation (Non-Medical): Not on file  Physical Activity:    Days of Exercise per Week: Not on file   Minutes of Exercise per Session: Not on file  Stress:    Feeling of Stress : Not on file  Social Connections:    Frequency of Communication with Friends and Family: Not on file   Frequency of Social Gatherings with Friends and Family: Not on file   Attends Religious Services: Not on file   Active Member of Clubs or Organizations: Not on file   Attends Banker  Meetings: Not on file   Marital Status: Not on file  Intimate Partner Violence:    Fear of Current or Ex-Partner: Not on file   Emotionally Abused: Not on file   Physically Abused: Not on file   Sexually Abused: Not on file   Family History  Problem Relation Age of Onset   Cancer Mother        bladder    Diabetes Mother    Diabetes Father    Emphysema Father    GER disease Brother    Colon cancer Neg Hx    Colon polyps Neg Hx       VITAL SIGNS BP 127/70    Pulse 60    Temp 97.7 F (36.5 C)    Resp 18    Ht 5\' 7"  (1.702 m)    Wt 252 lb 12.8 oz (114.7 kg)    SpO2 97%    BMI 39.59 kg/m   Outpatient Encounter Medications as of 08/02/2020  Medication Sig   acetaminophen (TYLENOL) 325 MG tablet Take 650 mg by mouth every 4 (four) hours as  needed.   acetaminophen (TYLENOL) 650 MG CR tablet Take 1,300 mg by mouth daily.    aspirin 81 MG chewable tablet Chew 81 mg by mouth daily.   Balsam [place]-Castor Oil [facility]) OINT Special Instructions: Apply to sacrum, coccyx and bilateral buttocks qshift for prevention.   calcium-vitamin D (OSCAL WITH D) 500-200 MG-UNIT TABS TAKE (1) TABLET BY MOUTH (3) TIMES DAILY WITH MEALS.   FLUoxetine (PROZAC) 20 MG tablet Take 20 mg by mouth daily.    hydrochlorothiazide (HYDRODIURIL) 12.5 MG tablet Take 12.5 mg by mouth daily.   ipratropium-albuterol (DUONEB) 0.5-2.5 (3) MG/3ML SOLN Take 3 mLs by nebulization every 6 (six) hours as needed.   levETIRAcetam (KEPPRA) 500 MG tablet TAKE 1 TABLET BY MOUTH TWICE A DAY AS DIRECTED.   lovastatin (MEVACOR) 20 MG tablet Take 1 tablet (20 mg total) by mouth at bedtime.   Melatonin 5 MG TABS Take 5 mg by mouth at bedtime.   memantine (NAMENDA) 5 MG tablet Take 5 mg by mouth 2 (two) times daily.   NON FORMULARY Diet type:  NAS   polyethylene glycol (MIRALAX / GLYCOLAX) packet Take 17 g by mouth daily as needed.    potassium chloride (K-DUR,KLOR-CON) 10 MEQ tablet Take 10 mEq by mouth daily.   senna (SENOKOT) 8.6 MG TABS tablet Take 1 tablet by mouth at bedtime. For Constipation   sennosides-docusate sodium (SENOKOT-S) 8.6-50 MG tablet Take 2 tablets by mouth daily. For Constipation    SYMBICORT 80-4.5 MCG/ACT inhaler Inhale 2 puffs into the lungs 2 (two) times daily.    No facility-administered encounter medications on file as of 08/02/2020.     SIGNIFICANT DIAGNOSTIC EXAMS  PREVIOUS  09-07-19: DEXA: t score -1.06  NO NEW EXAMS.   LABS REVIEWED PREVIOUS  07-13-19: wbc 3.0; hgb 12.5; hct 38.6; mcv 92.3 plt 117; glucose 99; bun 15; creat 0.66; k+ 3.5; na++ 132; ca 8.1; liver normal albumin 3.5; chol 126; ldl 81; trig 41; hdl 37 02-04-20: wbc 4.8; hgb 12.8; hct 40.2; mcv 94.1 plt 170; glucose 88; bun 25; creat 0.72; k+ 3.7; na++ 141; ca 9.0;  liver normal albumin 4.0 chol 167 ldl 102 trig 75 hdl 50  TODAY  07-25-20: wbc 4.9; hgb 12.1; hct 38.5; mcv 95.1 plt 142; glucose 85; bun 20; creat 0.70; k+ 3.9; na++ 142; ca 8.4 liver normal albumin 3.5    Review of Systems  Constitutional: Negative for malaise/fatigue.  Respiratory: Negative for cough and shortness of breath.   Cardiovascular: Negative for chest pain, palpitations and leg swelling.  Gastrointestinal: Negative for abdominal pain, constipation and heartburn.  Musculoskeletal: Negative for back pain, joint pain and myalgias.  Skin: Negative.   Neurological: Negative for dizziness.  Psychiatric/Behavioral: The patient is not nervous/anxious.      Physical Exam Constitutional:      General: She is not in acute distress.    Appearance: She is well-developed. She is obese. She is not diaphoretic.  Neck:     Thyroid: No thyromegaly.  Cardiovascular:     Rate and Rhythm: Normal rate and regular rhythm.     Pulses: Normal pulses.     Heart sounds: Normal heart sounds.  Pulmonary:     Effort: Pulmonary effort is normal. No respiratory distress.     Breath sounds: Normal breath sounds.  Abdominal:     General: Bowel sounds are normal. There is no distension.     Palpations: Abdomen is soft.     Tenderness: There is no abdominal tenderness.  Musculoskeletal:     Cervical back: Neck supple.     Right lower leg: No edema.     Left lower leg: No edema.     Comments: Is able to move all extremities Uses wheelchair  2016: left hip fracture left hip replacement        Lymphadenopathy:     Cervical: No cervical adenopathy.  Skin:    General: Skin is warm and dry.  Neurological:     Mental Status: She is alert. Mental status is at baseline.  Psychiatric:        Mood and Affect: Mood normal.      ASSESSMENT/ PLAN:  TODAY:   1. Chronic constipation; is stable will continue colace 200 mg daily senna 2 tabs daily miralax daily as needed  2. Hypokalemia: is stable  k+ 3.9 will continue k+ 10 meq daily   3. Unspecified hyperlipidemia: is stable LDL 120; will continue mevacor 20 mg daily    PREVIOUS  4. Unspecified obsessive compulsive disorder: is stable will continue prozac 20 mg daily and melatonin 5 mg nightly   5. Chronic generalized pain: is stable will continue tylenol 1300 mg daily   6. Seizure disorder: is stable no reports of seizure activity will continue keppra 500 mg twice daily   7. Chronic deep vein thrombosis (DVT) popliteal vein unspecified laterality is stable will monitor   8. Essential hypertension: is stable b/p 127/70 will continue hctz 12.5 mg daily   9. Late onset alzheimer's disease without behavioral disturbance:  is without change; weight is 252 pounds will continue namenda 5 mg twice daily  10. Simple chronic bronchitis: is stable will continue symbicort 80/4.5 mcg 2 puffs twice daily    MD is aware of resident's narcotic use and is in agreement with current plan of care. We will attempt to wean resident as appropriate.  Synthia Innocent NP [facility]  Contact [phone] Monday through Friday 8am- 5pm  After hours call [phone]

## 2020-08-31 ENCOUNTER — Non-Acute Institutional Stay (SKILLED_NURSING_FACILITY): Payer: Medicare PPO | Admitting: Adult Health

## 2020-08-31 ENCOUNTER — Encounter: Payer: Self-pay | Admitting: Adult Health

## 2020-08-31 DIAGNOSIS — J410 Simple chronic bronchitis: Secondary | ICD-10-CM | POA: Diagnosis not present

## 2020-08-31 DIAGNOSIS — F429 Obsessive-compulsive disorder, unspecified: Secondary | ICD-10-CM

## 2020-08-31 DIAGNOSIS — I10 Essential (primary) hypertension: Secondary | ICD-10-CM

## 2020-08-31 DIAGNOSIS — G40909 Epilepsy, unspecified, not intractable, without status epilepticus: Secondary | ICD-10-CM

## 2020-08-31 DIAGNOSIS — F02818 Dementia in other diseases classified elsewhere, unspecified severity, with other behavioral disturbance: Secondary | ICD-10-CM

## 2020-08-31 DIAGNOSIS — G301 Alzheimer's disease with late onset: Secondary | ICD-10-CM

## 2020-08-31 DIAGNOSIS — I82539 Chronic embolism and thrombosis of unspecified popliteal vein: Secondary | ICD-10-CM | POA: Diagnosis not present

## 2020-08-31 DIAGNOSIS — M810 Age-related osteoporosis without current pathological fracture: Secondary | ICD-10-CM

## 2020-08-31 DIAGNOSIS — E669 Obesity, unspecified: Secondary | ICD-10-CM

## 2020-08-31 DIAGNOSIS — F0281 Dementia in other diseases classified elsewhere with behavioral disturbance: Secondary | ICD-10-CM

## 2020-08-31 NOTE — Progress Notes (Signed)
Provider:Debbie Thomasene Lot NP  Location   [facility]  PCP: Sharee Holster, NP   Extended Emergency Contact Information Primary Emergency Contact: Willy,Alfreda Address: [address]          [name], [place] [address] [place] Home Phone: [phone] Mobile Phone: [phone] Relation: Daughter Secondary Emergency Contact: Vivien Presto [place] of [place] Mobile Phone: [phone] Relation: Son  Codes status: Full Code Goals of care: advanced directive information Advanced Directives 08/31/2020  Does Patient Have a Medical Advance Directive? Yes  Type of Advance Directive -  Does patient want to make changes to medical advance directive? -  Copy of Healthcare Power of Attorney in Chart? -  Would patient like information on creating a medical advance directive? -  Pre-existing out of facility DNR order (yellow form or pink MOST form) -     No Known Allergies  Chief Complaint  Patient presents with  . Annual Exam    Annual Exam    HPI  She is a 86 year old long term resident of this facility being seen for her annual exam. She has not required any hospitalizations over the past year. Her pain is being managed; she denies any pain. She denies any anxiety no insomnia no constipation. She continues to be followed for her chronic illnesses including: Unspecified obsessive compulsive disorder:  Chronic generalized pain:  Seizure disorder. She has had COVID this past year; did not require hospitalization.     Past Medical History:  Diagnosis Date  . Diverticulosis OCT 2010 TCS   SIGMOID COLON  . Esophageal web 2010   DILATION 16 MM  . Gastritis, Helicobacter pylori 2010   Rx. ABO for 10 days   . GERD (gastroesophageal reflux disease)   . Hip fracture, left (HCC)    2016  . Hyperlipidemia   . Hypertension   . Restless legs syndrome   . Seizures [facility]) January 2010   Post head trauma in fall .sees Dr Gerilyn Pilgrim   Past  Surgical History:  Procedure Laterality Date  . ABDOMINAL HYSTERECTOMY    . COLONOSCOPY  OCT 2010   TORTUOUS, Sml IH, Tics, MULTIPLE SIMPLE ADENOMAS (<6MM)  . HIP ARTHROPLASTY Left 11/19/2014   Procedure: LEFT PARTIAL HIP REPLACEMENT;  Surgeon: Vickki Hearing, MD;  Location: [facility];  Service: Orthopedics;  Laterality: Left;  . ORIF HIP FRACTURE  04/05/2012   Procedure: OPEN REDUCTION INTERNAL FIXATION HIP;  Surgeon: Vickki Hearing, MD;  Location: [facility];  Service: Orthopedics;  Laterality: Right;  . UPPER GASTROINTESTINAL ENDOSCOPY  AUG 2010   SAVARY  . VESICOVAGINAL FISTULA CLOSURE W/ TAH  1966   bleeding     reports that she has never smoked. She has never used smokeless tobacco. She reports that she does not drink alcohol and does not use drugs. Social History   Tobacco Use  . Smoking status: Never Smoker  . Smokeless tobacco: Never Used  Substance Use Topics  . Alcohol use: No  . Drug use: No   Family History  Problem Relation Age of Onset  . Cancer Mother        bladder   . Diabetes Mother   . Diabetes Father   . Emphysema Father   . GER disease Brother   . Colon cancer Neg Hx   . Colon polyps Neg Hx     Pertinent  Health Maintenance Due  Topic Date Due  . INFLUENZA VACCINE  Completed  . DEXA SCAN  Completed  . PNA vac Low Risk Adult  Completed   Fall Risk  01/22/2020 07/14/2018 07/04/2017 04/29/2014 07/30/2013  Falls in the past year? 0 No No Yes Yes  Number falls in past yr: 0 - - 1 2 or more  Injury with Fall? - - - No -  Risk for fall due to : - - - - Impaired balance/gait;Impaired mobility;History of fall(s)   Depression screen [facility] 2/9 01/22/2020 07/14/2018 07/04/2017 04/29/2014  Decreased Interest 0 0 1 1  Down, Depressed, Hopeless 0 1 3 1   PHQ - 2 Score 0 1 4 2   Altered sleeping 0 - 3 3  Tired, decreased energy 0 - 3 2  Change in appetite 0 - 0 0  Feeling bad or failure about yourself  0 - 0 1  Trouble concentrating 0 - 1 3  Moving slowly or  fidgety/restless 0 - 0 1  Suicidal thoughts 0 - 0 0  PHQ-9 Score 0 - 11 12  Difficult doing work/chores - - Somewhat difficult -  Some recent data might be hidden    Functional Status Survey:    Outpatient Encounter Medications as of 08/31/2020  Medication Sig  . acetaminophen (TYLENOL) 325 MG tablet Take 650 mg by mouth every 4 (four) hours as needed.  acetaminophen (TYLENOL) 650 MG CR tablet Take 1,300 mg by mouth daily.   09/02/2020 aspirin 81 MG chewable tablet Chew 81 mg by mouth daily.  Marland Kitchen [place]-Castor Oil [facility]) OINT Special Instructions: Apply to sacrum, coccyx and bilateral buttocks qshift for prevention.  . calcium-vitamin D (OSCAL WITH D) 500-200 MG-UNIT TABS TAKE (1) TABLET BY MOUTH (3) TIMES DAILY WITH MEALS.  Lucilla Lame FLUoxetine (PROZAC) 20 MG tablet Take 20 mg by mouth daily.   . hydrochlorothiazide (HYDRODIURIL) 12.5 MG tablet Take 12.5 mg by mouth daily.  [facility] ipratropium-albuterol (DUONEB) 0.5-2.5 (3) MG/3ML SOLN Take 3 mLs by nebulization every 6 (six) hours as needed.  . levETIRAcetam (KEPPRA) 500 MG tablet TAKE 1 TABLET BY MOUTH TWICE A DAY AS DIRECTED.  Marland Kitchen lovastatin (MEVACOR) 20 MG tablet Take 1 tablet (20 mg total) by mouth at bedtime.  . Melatonin 5 MG TABS Take 5 mg by mouth at bedtime.  . memantine (NAMENDA) 5 MG tablet Take 5 mg by mouth 2 (two) times daily.  . NON FORMULARY Diet type:  NAS  . polyethylene glycol (MIRALAX / GLYCOLAX) packet Take 17 g by mouth daily as needed.   . potassium chloride (K-DUR,KLOR-CON) 10 MEQ tablet Take 10 mEq by mouth daily.  Marland Kitchen senna (SENOKOT) 8.6 MG TABS tablet Take 1 tablet by mouth at bedtime. For Constipation  . sennosides-docusate sodium (SENOKOT-S) 8.6-50 MG tablet Take 2 tablets by mouth daily. For Constipation   . SYMBICORT 80-4.5 MCG/ACT inhaler Inhale 2 puffs into the lungs 2 (two) times daily.    No facility-administered encounter medications on file as of 08/31/2020.     Vitals:   08/31/20 1547  BP: 112/60  Pulse: 60   Resp: 20  Temp: 97.7 F (36.5 C)  SpO2: 99%  Weight: 249 lb 9.6 oz (113.2 kg)  Height: 5\' 7"  (1.702 m)   Body mass index is 39.09 kg/m.  DIAGNOSTIC EXAMS   PREVIOUS  09-07-19: DEXA: t score -1.06  NO NEW EXAMS.   LABS REVIEWED PREVIOUS  02-04-20: wbc 4.8; hgb 12.8; hct 40.2; mcv 94.1 plt 170; glucose 88; bun 25; creat 0.72; k+ 3.7; na++ 141; ca 9.0; liver normal albumin 4.0 chol 167 ldl  102 trig 75 hdl 50 07-25-20: wbc 4.9; hgb 12.1; hct 38.5; mcv 95.1 plt 142; glucose 85; bun 20; creat 0.70; k+ 3.9; na++ 142; ca 8.4 liver normal albumin 3.5  Chol 141; ldl 87; trig 61; hdl 42   NO NEW LABS   Review of Systems  Constitutional: Negative for malaise/fatigue.  Respiratory: Negative for cough and shortness of breath.   Cardiovascular: Negative for chest pain, palpitations and leg swelling.  Gastrointestinal: Negative for abdominal pain, constipation and heartburn.  Musculoskeletal: Negative for back pain, joint pain and myalgias.  Skin: Negative.   Neurological: Negative for dizziness.  Psychiatric/Behavioral: The patient is not nervous/anxious.     Physical Exam Constitutional:      General: She is not in acute distress.    Appearance: She is well-developed. She is morbidly obese. She is not diaphoretic.  HENT:     Right Ear: Tympanic membrane normal.     Left Ear: Tympanic membrane normal.     Nose: Nose normal.     Mouth/Throat:     Mouth: Mucous membranes are moist.     Pharynx: Oropharynx is clear.  Eyes:     Conjunctiva/sclera: Conjunctivae normal.  Neck:     Thyroid: No thyromegaly.  Cardiovascular:     Rate and Rhythm: Normal rate and regular rhythm.     Heart sounds: Normal heart sounds.  Pulmonary:     Effort: Pulmonary effort is normal. No respiratory distress.     Breath sounds: Normal breath sounds.  Abdominal:     General: Bowel sounds are normal. There is no distension.     Palpations: Abdomen is soft.     Tenderness: There is no abdominal  tenderness.  Musculoskeletal:     Cervical back: Neck supple.     Right lower leg: No edema.     Left lower leg: No edema.     Comments: Is able to move all extremities Uses wheelchair  2016: left hip fracture left hip replacement         Lymphadenopathy:     Cervical: No cervical adenopathy.  Skin:    General: Skin is warm and dry.  Neurological:     Mental Status: She is alert. Mental status is at baseline.  Psychiatric:        Mood and Affect: Mood normal.     ASSESSMENT/ PLAN:  TODAY:   1. Unspecified obsessive compulsive disorder: is stable will continue prozac 20 mg daily and melatonin 5 mg nightly  2. Chronic generalized pain: is stable will continue tylenol 1300 mg daily  3. Seizure disorder: is stable no reports of seizure activity will continue keppra 500 mg twice daily  4. Chronic deep vein thrombosis (DVT) popliteal vein unspecified laterality: is stable will monitor   5. Essential hypertension: is stable b/p 112/60 will continue hctz 12.5 mg daily   6. Late onset alzheimer's disease without behavorial disturbance is without change weight is 249 pounds will continue namenda 5 mg twice daily  7. Simple chronic bronchitis is stable will continue symbicort 80/4.5 mcg 2 puffs twice daily   8. Chronic constipation: is stable will continue colace 200 mg daily senna 2 tabs daily miralax daily as needed  9. Hypokalemia: is stable k+ 3.9 will continue k+ 10 meq daily   10. Unspecified hyperlipidemia: is stable LDL 87 will continue mevacor 20 mg daily      MD is aware of resident's narcotic use and is in agreement with current plan of care. We will  attempt to wean resident as appropriate.  Ok Edwards NP [facility]  Contact [phone] Monday through Friday 8am- 5pm  After hours call [phone]

## 2020-09-20 ENCOUNTER — Encounter: Payer: Self-pay | Admitting: Adult Health

## 2020-09-20 ENCOUNTER — Non-Acute Institutional Stay: Payer: Self-pay | Admitting: Adult Health

## 2020-09-20 DIAGNOSIS — F418 Other specified anxiety disorders: Secondary | ICD-10-CM

## 2020-09-20 DIAGNOSIS — G301 Alzheimer's disease with late onset: Secondary | ICD-10-CM

## 2020-09-20 DIAGNOSIS — F02818 Dementia in other diseases classified elsewhere, unspecified severity, with other behavioral disturbance: Secondary | ICD-10-CM

## 2020-09-20 NOTE — Progress Notes (Signed)
Location:  [facility] Room Number: 116-D Place of Service:  SNF (31)   CODE STATUS: Full Code   No Known Allergies  Chief Complaint  Patient presents with  . Acute Visit    Medication review     HPI:  She is taking prozac 20 mg daily (has failed one wean) and namenda 5 mg twice daily. Emotionally she is doing well. There are no reports of agitation; no anxiety; no paranoia. The care plan team feels that lowering her medications at this time could cause her emotional harm.   Past Medical History:  Diagnosis Date  . Diverticulosis OCT 2010 TCS   SIGMOID COLON  . Esophageal web 2010   DILATION 16 MM  . Gastritis, Helicobacter pylori 2010   Rx. ABO for 10 days   . GERD (gastroesophageal reflux disease)   . Hip fracture, left (HCC)    2016  . Hyperlipidemia   . Hypertension   . Restless legs syndrome   . Seizures [facility]) January 2010   Post head trauma in fall .sees Dr Gerilyn Pilgrim    Past Surgical History:  Procedure Laterality Date  . ABDOMINAL HYSTERECTOMY    . COLONOSCOPY  OCT 2010   TORTUOUS, Sml IH, Tics, MULTIPLE SIMPLE ADENOMAS (<6MM)  . HIP ARTHROPLASTY Left 11/19/2014   Procedure: LEFT PARTIAL HIP REPLACEMENT;  Surgeon: Vickki Hearing, MD;  Location: [facility];  Service: Orthopedics;  Laterality: Left;  . ORIF HIP FRACTURE  04/05/2012   Procedure: OPEN REDUCTION INTERNAL FIXATION HIP;  Surgeon: Vickki Hearing, MD;  Location: [facility];  Service: Orthopedics;  Laterality: Right;  . UPPER GASTROINTESTINAL ENDOSCOPY  AUG 2010   SAVARY  . VESICOVAGINAL FISTULA CLOSURE W/ TAH  1966   bleeding     Social History   Socioeconomic History  . Marital status: Widowed    Spouse name: Not on file  . Number of children: Not on file  . Years of education: Not on file  . Highest education level: Not on file  Occupational History  . 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 agent   Tobacco Use  . Smoking status: Never Smoker  . Smokeless  tobacco: Never Used  Vaping Use  . Vaping Use: Never used  Substance and Sexual Activity  . Alcohol use: No  . Drug use: No  . Sexual activity: Never  Other Topics Concern  . Not on file  Social History Narrative  . Not on file   Social Determinants of Health   Financial Resource Strain: Not on file  Food Insecurity: Not on file  Transportation Needs: Not on file  Physical Activity: Not on file  Stress: Not on file  Social Connections: Not on file  Intimate Partner Violence: Not on file   Family History  Problem Relation Age of Onset  . Cancer Mother        bladder   . Diabetes Mother   . Diabetes Father   . Emphysema Father   . GER disease Brother   . Colon cancer Neg Hx   . Colon polyps Neg Hx       VITAL SIGNS BP 130/67   Pulse (!) 59   Temp 97.9 F (36.6 C)   Resp 20   Ht 5\' 7"  (1.702 m)   Wt 252 lb 12.8 oz (114.7 kg)   SpO2 99%   BMI 39.59 kg/m   Outpatient Encounter Medications as of 09/20/2020  Medication Sig  . acetaminophen (TYLENOL) 325 MG tablet  Take 650 mg by mouth every 4 (four) hours as needed.  Marland Kitchen acetaminophen (TYLENOL) 650 MG CR tablet Take 1,300 mg by mouth daily.   Marland Kitchen aspirin 81 MG chewable tablet Chew 81 mg by mouth daily.  Lucilla Lame [place]-Castor Oil [facility]) OINT Special Instructions: Apply to sacrum, coccyx and bilateral buttocks qshift for prevention.  . calcium-vitamin D (OSCAL WITH D) 500-200 MG-UNIT TABS TAKE (1) TABLET BY MOUTH (3) TIMES DAILY WITH MEALS.  Marland Kitchen FLUoxetine (PROZAC) 20 MG tablet Take 20 mg by mouth daily.   . hydrochlorothiazide (HYDRODIURIL) 12.5 MG tablet Take 12.5 mg by mouth daily.  Marland Kitchen ipratropium-albuterol (DUONEB) 0.5-2.5 (3) MG/3ML SOLN Take 3 mLs by nebulization every 6 (six) hours as needed.  . levETIRAcetam (KEPPRA) 500 MG tablet TAKE 1 TABLET BY MOUTH TWICE A DAY AS DIRECTED.  Marland Kitchen lovastatin (MEVACOR) 20 MG tablet Take 1 tablet (20 mg total) by mouth at bedtime.  . Melatonin 5 MG TABS Take 5 mg by mouth at  bedtime.  . memantine (NAMENDA) 5 MG tablet Take 5 mg by mouth 2 (two) times daily.  . NON FORMULARY Diet type:  NAS  . polyethylene glycol (MIRALAX / GLYCOLAX) packet Take 17 g by mouth daily as needed.   . potassium chloride (K-DUR,KLOR-CON) 10 MEQ tablet Take 10 mEq by mouth daily.  Marland Kitchen senna (SENOKOT) 8.6 MG TABS tablet Take 1 tablet by mouth at bedtime. For Constipation  . sennosides-docusate sodium (SENOKOT-S) 8.6-50 MG tablet Take 2 tablets by mouth daily. For Constipation   . SYMBICORT 80-4.5 MCG/ACT inhaler Inhale 2 puffs into the lungs 2 (two) times daily.    No facility-administered encounter medications on file as of 09/20/2020.     SIGNIFICANT DIAGNOSTIC EXAMS  PREVIOUS  09-07-19: DEXA: t score -1.06  NO NEW EXAMS.   LABS REVIEWED PREVIOUS  02-04-20: wbc 4.8; hgb 12.8; hct 40.2; mcv 94.1 plt 170; glucose 88; bun 25; creat 0.72; k+ 3.7; na++ 141; ca 9.0; liver normal albumin 4.0 chol 167 ldl 102 trig 75 hdl 50 07-25-20: wbc 4.9; hgb 12.1; hct 38.5; mcv 95.1 plt 142; glucose 85; bun 20; creat 0.70; k+ 3.9; na++ 142; ca 8.4 liver normal albumin 3.5   NO NEW LABS.    Review of Systems  Constitutional: Negative for malaise/fatigue.  Respiratory: Negative for cough and shortness of breath.   Cardiovascular: Negative for chest pain, palpitations and leg swelling.  Gastrointestinal: Negative for abdominal pain, constipation and heartburn.  Musculoskeletal: Negative for back pain, joint pain and myalgias.  Skin: Negative.   Neurological: Negative for dizziness.  Psychiatric/Behavioral: The patient is not nervous/anxious.       Physical Exam Constitutional:      General: She is not in acute distress.    Appearance: She is well-developed and well-nourished. She is obese. She is not diaphoretic.  Neck:     Thyroid: No thyromegaly.  Cardiovascular:     Rate and Rhythm: Normal rate and regular rhythm.     Pulses: Normal pulses and intact distal pulses.     Heart sounds:  Normal heart sounds.  Pulmonary:     Effort: Pulmonary effort is normal. No respiratory distress.     Breath sounds: Normal breath sounds.  Abdominal:     General: Bowel sounds are normal. There is no distension.     Palpations: Abdomen is soft.     Tenderness: There is no abdominal tenderness.  Musculoskeletal:        General: No edema.  Cervical back: Neck supple.     Right lower leg: No edema.     Left lower leg: No edema.     Comments: Is able to move all extremities Uses wheelchair  2016: left hip fracture left hip replacement  Lymphadenopathy:     Cervical: No cervical adenopathy.  Skin:    General: Skin is warm and dry.  Neurological:     Mental Status: She is alert. Mental status is at baseline.  Psychiatric:        Mood and Affect: Mood and affect and mood normal.      ASSESSMENT/ PLAN:  TODAY  1. Late onset alzheimer's disease with behavioral disturbance 2. Depression with anxiety  She is stable with her current regimen. Lowering her medications at this time would cause her emotional harm. Will continue to monitor her status; and will make further adjustments as indicated.   MD is aware of resident's narcotic use and is in agreement with current plan of care. We will attempt to wean resident as appropriate.  Synthia Innocent NP [facility]  Contact [phone] Monday through Friday 8am- 5pm  After hours call [phone]

## 2020-09-29 ENCOUNTER — Non-Acute Institutional Stay (SKILLED_NURSING_FACILITY): Payer: Medicare PPO | Admitting: Adult Health

## 2020-09-29 ENCOUNTER — Encounter: Payer: Self-pay | Admitting: Adult Health

## 2020-09-29 DIAGNOSIS — G40909 Epilepsy, unspecified, not intractable, without status epilepticus: Secondary | ICD-10-CM | POA: Diagnosis not present

## 2020-09-29 DIAGNOSIS — G8929 Other chronic pain: Secondary | ICD-10-CM | POA: Diagnosis not present

## 2020-09-29 DIAGNOSIS — R52 Pain, unspecified: Secondary | ICD-10-CM | POA: Diagnosis not present

## 2020-09-29 DIAGNOSIS — F429 Obsessive-compulsive disorder, unspecified: Secondary | ICD-10-CM

## 2020-09-29 NOTE — Progress Notes (Signed)
Location:  [facility] Room Number: 116-D Place of Service:  SNF (31)   CODE STATUS: Full code   No Known Allergies  Chief Complaint  Patient presents with   Medical Management of Chronic Issues         Unspecified obsessive compulsive disorder:    Chronic generalized pain:  Seizure disorder    HPI:  She is a 86 year old long term resident of this facility being seen for the management of her chronic illnesses:Unspecified obsessive compulsive disorder:    Chronic generalized pain:  Seizure disorder. There are no reports of pain; no reports of anxiety or depressive thoughts. She does come out of her room on daily basis.    Past Medical History:  Diagnosis Date   Diverticulosis OCT 2010 TCS   SIGMOID COLON   Esophageal web 2010   DILATION 16 MM   Gastritis, Helicobacter pylori 2010   Rx. ABO for 10 days    GERD (gastroesophageal reflux disease)    Hip fracture, left (HCC)    2016   Hyperlipidemia    Hypertension    Restless legs syndrome    Seizures (HCC) January 2010   Post head trauma in fall .sees Dr Gerilyn Pilgrim    Past Surgical History:  Procedure Laterality Date   ABDOMINAL HYSTERECTOMY     COLONOSCOPY  OCT 2010   TORTUOUS, Sml IH, Tics, MULTIPLE SIMPLE ADENOMAS (<6MM)   HIP ARTHROPLASTY Left 11/19/2014   Procedure: LEFT PARTIAL HIP REPLACEMENT;  Surgeon: Vickki Hearing, MD;  Location: [facility];  Service: Orthopedics;  Laterality: Left;   ORIF HIP FRACTURE  04/05/2012   Procedure: OPEN REDUCTION INTERNAL FIXATION HIP;  Surgeon: Vickki Hearing, MD;  Location: [facility];  Service: Orthopedics;  Laterality: Right;   UPPER GASTROINTESTINAL ENDOSCOPY  AUG 2010   SAVARY   VESICOVAGINAL FISTULA CLOSURE W/ TAH  1966   bleeding     Social History   Socioeconomic History   Marital status: Widowed    Spouse name: Not on file   Number of children: Not on file   Years of education: Not on file   Highest education level: Not on  file  Occupational History   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 agent   Tobacco Use   Smoking status: Never Smoker   Smokeless tobacco: Never Used  Building services engineer Use: Never used  Substance and Sexual Activity   Alcohol use: No   Drug use: No   Sexual activity: Never  Other Topics Concern   Not on file  Social History Narrative   Not on file   Social Determinants of Health   Financial Resource Strain: Not on file  Food Insecurity: Not on file  Transportation Needs: Not on file  Physical Activity: Not on file  Stress: Not on file  Social Connections: Not on file  Intimate Partner Violence: Not on file   Family History  Problem Relation Age of Onset   Cancer Mother        bladder    Diabetes Mother    Diabetes Father    Emphysema Father    GER disease Brother    Colon cancer Neg Hx    Colon polyps Neg Hx       VITAL SIGNS BP 123/65    Pulse 70    Temp 98.4 F (36.9 C)    Resp 20    Ht 5\' 7"  (1.702 m)    Wt 252 lb 12.8  oz (114.7 kg)    SpO2 99%    BMI 39.59 kg/m   Outpatient Encounter Medications as of 09/29/2020  Medication Sig   acetaminophen (TYLENOL) 650 MG CR tablet Take 1,300 mg by mouth daily.    aspirin 81 MG chewable tablet Chew 81 mg by mouth daily.   Balsam [place]-Castor Oil [facility]) OINT Special Instructions: Apply to sacrum, coccyx and bilateral buttocks qshift for prevention.   calcium carbonate (TUMS - DOSED IN MG ELEMENTAL CALCIUM) 500 MG chewable tablet Chew 1 tablet by mouth 3 (three) times daily. DX age related osteoporosis without current pathological fracture 07/10/2018 per Dr.Gupta note   FLUoxetine (PROZAC) 20 MG tablet Take 20 mg by mouth daily.    hydrochlorothiazide (HYDRODIURIL) 12.5 MG tablet Take 12.5 mg by mouth daily.   ipratropium-albuterol (DUONEB) 0.5-2.5 (3) MG/3ML SOLN Take 3 mLs by nebulization every 6 (six) hours as needed.   levETIRAcetam (KEPPRA) 500 MG tablet TAKE 1 TABLET BY MOUTH  TWICE A DAY AS DIRECTED.   lovastatin (MEVACOR) 20 MG tablet Take 1 tablet (20 mg total) by mouth at bedtime.   Melatonin 5 MG TABS Take 5 mg by mouth at bedtime.   memantine (NAMENDA) 5 MG tablet Take 5 mg by mouth 2 (two) times daily.   NON FORMULARY Diet type:  NAS   polyethylene glycol (MIRALAX / GLYCOLAX) packet Take 17 g by mouth daily as needed.    potassium chloride (K-DUR,KLOR-CON) 10 MEQ tablet Take 10 mEq by mouth daily.   senna (SENOKOT) 8.6 MG TABS tablet Take 1 tablet by mouth at bedtime. For Constipation   sennosides-docusate sodium (SENOKOT-S) 8.6-50 MG tablet Take 1 tablet by mouth at bedtime. For Constipation   SYMBICORT 80-4.5 MCG/ACT inhaler Inhale 2 puffs into the lungs 2 (two) times daily.    [DISCONTINUED] acetaminophen (TYLENOL) 325 MG tablet Take 650 mg by mouth every 4 (four) hours as needed.   [DISCONTINUED] calcium-vitamin D (OSCAL WITH D) 500-200 MG-UNIT TABS TAKE (1) TABLET BY MOUTH (3) TIMES DAILY WITH MEALS.   No facility-administered encounter medications on file as of 09/29/2020.     SIGNIFICANT DIAGNOSTIC EXAMS   PREVIOUS  09-07-19: DEXA: t score -1.06  NO NEW EXAMS.   LABS REVIEWED PREVIOUS  02-04-20: wbc 4.8; hgb 12.8; hct 40.2; mcv 94.1 plt 170; glucose 88; bun 25; creat 0.72; k+ 3.7; na++ 141; ca 9.0; liver normal albumin 4.0 chol 167 ldl 102 trig 75 hdl 50 07-25-20: wbc 4.9; hgb 12.1; hct 38.5; mcv 95.1 plt 142; glucose 85; bun 20; creat 0.70; k+ 3.9; na++ 142; ca 8.4 liver normal albumin 3.5   NO NEW LABS.    Review of Systems  Constitutional: Negative for malaise/fatigue.  Respiratory: Negative for cough and shortness of breath.   Cardiovascular: Negative for chest pain, palpitations and leg swelling.  Gastrointestinal: Negative for abdominal pain, constipation and heartburn.  Musculoskeletal: Negative for back pain, joint pain and myalgias.  Skin: Negative.   Neurological: Negative for dizziness.  Psychiatric/Behavioral:  The patient is not nervous/anxious.      Physical Exam Constitutional:      General: She is not in acute distress.    Appearance: She is well-developed and well-nourished. She is obese. She is not diaphoretic.  Neck:     Thyroid: No thyromegaly.  Cardiovascular:     Rate and Rhythm: Normal rate and regular rhythm.     Pulses: Normal pulses and intact distal pulses.     Heart sounds: Normal heart sounds.  Pulmonary:  Effort: Pulmonary effort is normal. No respiratory distress.     Breath sounds: Normal breath sounds.  Abdominal:     General: Bowel sounds are normal. There is no distension.     Palpations: Abdomen is soft.     Tenderness: There is no abdominal tenderness.  Musculoskeletal:        General: No edema.     Cervical back: Neck supple.     Right lower leg: No edema.     Left lower leg: No edema.     Comments:  Is able to move all extremities Uses wheelchair  2016: left hip fracture left hip replacement   Lymphadenopathy:     Cervical: No cervical adenopathy.  Skin:    General: Skin is warm and dry.  Neurological:     Mental Status: She is alert. Mental status is at baseline.  Psychiatric:        Mood and Affect: Mood and affect and mood normal.     ASSESSMENT/ PLAN:  TODAY:   1. Unspecified obsessive compulsive disorder: is stable will continue prozac 20 mg daily and melatonin 5 mg daily   2. Chronic generalized pain: is stable will continue tylenol 1300 mg daily   3. Seizure disorder: is stable no reports of seizure activity will continue keppra 500 mg twice daily   PREVIOUS   4. Chronic deep vein thrombosis (DVT) popliteal vein unspecified laterality: is stable will monitor   5. Essential hypertension: is stable b/p 123/65 will continue hctz 12.5 mg daily   6. Late onset alzheimer's disease without behavorial disturbance is without change weight is 249 pounds will continue namenda 5 mg twice daily  7. Simple chronic bronchitis is stable will  continue symbicort 80/4.5 mcg 2 puffs twice daily   8. Chronic constipation: is stable will continue colace 200 mg daily senna 2 tabs daily miralax daily as needed  9. Hypokalemia: is stable k+ 3.9 will continue k+ 10 meq daily   10. Unspecified hyperlipidemia: is stable LDL 87 will continue mevacor 20 mg daily    11. Obesity BMI 30.0-39.9: BMI 39.59 will monitor    MD is aware of resident's narcotic use and is in agreement with current plan of care. We will attempt to wean resident as appropriate.  Synthia Innocent NP [facility]  Contact [phone] Monday through Friday 8am- 5pm  After hours call [phone]

## 2020-10-20 ENCOUNTER — Encounter: Payer: Self-pay | Admitting: Adult Health

## 2020-10-20 ENCOUNTER — Non-Acute Institutional Stay (SKILLED_NURSING_FACILITY): Payer: Medicare PPO | Admitting: Adult Health

## 2020-10-20 DIAGNOSIS — G40909 Epilepsy, unspecified, not intractable, without status epilepticus: Secondary | ICD-10-CM

## 2020-10-20 DIAGNOSIS — G301 Alzheimer's disease with late onset: Secondary | ICD-10-CM

## 2020-10-20 DIAGNOSIS — F02818 Dementia in other diseases classified elsewhere, unspecified severity, with other behavioral disturbance: Secondary | ICD-10-CM

## 2020-10-20 DIAGNOSIS — J410 Simple chronic bronchitis: Secondary | ICD-10-CM | POA: Diagnosis not present

## 2020-10-20 DIAGNOSIS — F0281 Dementia in other diseases classified elsewhere with behavioral disturbance: Secondary | ICD-10-CM | POA: Diagnosis not present

## 2020-10-20 NOTE — Progress Notes (Signed)
Location:  [facility] Room Number: 116/D Place of Service:  SNF (31)   CODE STATUS: Full Code  No Known Allergies  Chief Complaint  Patient presents with  . Acute Visit    Care Plan Meeting     HPI:  We have come together for her care plan meeting. BIMS 9/15 mood 8/30. She requires extensive assist to dependent with her adls. She is frequently incontinent of her bowel and bladder. She is nonambulatory. She feeds herself. She has had 2 falls one in Nov no injury and one in Dec hematoma. Weight is 255 pounds has a slow weight gain; her appetite is good. There are no reports of uncontrolled pain she continues to get out of bed daily and does sit outside her room. She continues to be followed for her chronic illnesses including:  Late onset alzheimer's disease with behavioral disturbance  Seizure disorder  Simple chronic bronchitis  Past Medical History:  Diagnosis Date  . Diverticulosis OCT 2010 TCS   SIGMOID COLON  . Esophageal web 2010   DILATION 16 MM  . Gastritis, Helicobacter pylori 2010   Rx. ABO for 10 days   . GERD (gastroesophageal reflux disease)   . Hip fracture, left (HCC)    2016  . Hyperlipidemia   . Hypertension   . Restless legs syndrome   . Seizures [facility]) January 2010   Post head trauma in fall .sees Dr Gerilyn Pilgrim    Past Surgical History:  Procedure Laterality Date  . ABDOMINAL HYSTERECTOMY    . COLONOSCOPY  OCT 2010   TORTUOUS, Sml IH, Tics, MULTIPLE SIMPLE ADENOMAS (<6MM)  . HIP ARTHROPLASTY Left 11/19/2014   Procedure: LEFT PARTIAL HIP REPLACEMENT;  Surgeon: Vickki Hearing, MD;  Location: [facility];  Service: Orthopedics;  Laterality: Left;  . ORIF HIP FRACTURE  04/05/2012   Procedure: OPEN REDUCTION INTERNAL FIXATION HIP;  Surgeon: Vickki Hearing, MD;  Location: [facility];  Service: Orthopedics;  Laterality: Right;  . UPPER GASTROINTESTINAL ENDOSCOPY  AUG 2010   SAVARY  . VESICOVAGINAL FISTULA CLOSURE W/ TAH  1966   bleeding      Social History   Socioeconomic History  . Marital status: Widowed    Spouse name: Not on file  . Number of children: Not on file  . Years of education: Not on file  . Highest education level: Not on file  Occupational History  . 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 agent   Tobacco Use  . Smoking status: Never Smoker  . Smokeless tobacco: Never Used  Vaping Use  . Vaping Use: Never used  Substance and Sexual Activity  . Alcohol use: No  . Drug use: No  . Sexual activity: Never  Other Topics Concern  . Not on file  Social History Narrative  . Not on file   Social Determinants of Health   Financial Resource Strain: Not on file  Food Insecurity: Not on file  Transportation Needs: Not on file  Physical Activity: Not on file  Stress: Not on file  Social Connections: Not on file  Intimate Partner Violence: Not on file   Family History  Problem Relation Age of Onset  . Cancer Mother        bladder   . Diabetes Mother   . Diabetes Father   . Emphysema Father   . GER disease Brother   . Colon cancer Neg Hx   . Colon polyps Neg Hx       VITAL SIGNS  BP 116/74   Pulse 78   Temp 98 F (36.7 C)   Resp 20   Ht 5\' 7"  (1.702 m)   Wt 254 lb 9.6 oz (115.5 kg)   SpO2 92%   BMI 39.88 kg/m   Outpatient Encounter Medications as of 10/20/2020  Medication Sig  . acetaminophen (TYLENOL) 650 MG CR tablet Take 1,300 mg by mouth daily.   10/22/2020 aspirin 81 MG chewable tablet Chew 81 mg by mouth daily.  Marland Kitchen [place]-Castor Oil [facility]) OINT Special Instructions: Apply to sacrum, coccyx and bilateral buttocks qshift for prevention.  . calcium carbonate (TUMS - DOSED IN MG ELEMENTAL CALCIUM) 500 MG chewable tablet Chew 1 tablet by mouth 3 (three) times daily. DX age related osteoporosis without current pathological fracture 07/10/2018 per Dr.Gupta note  . FLUoxetine (PROZAC) 20 MG tablet Take 20 mg by mouth daily.   . hydrochlorothiazide (HYDRODIURIL) 12.5 MG tablet  Take 12.5 mg by mouth daily.  09/09/2018 ipratropium-albuterol (DUONEB) 0.5-2.5 (3) MG/3ML SOLN Take 3 mLs by nebulization every 6 (six) hours as needed.  . levETIRAcetam (KEPPRA) 500 MG tablet TAKE 1 TABLET BY MOUTH TWICE A DAY AS DIRECTED.  Marland Kitchen lovastatin (MEVACOR) 20 MG tablet Take 1 tablet (20 mg total) by mouth at bedtime.  . Melatonin 5 MG TABS Take 5 mg by mouth at bedtime.  . memantine (NAMENDA) 5 MG tablet Take 5 mg by mouth 2 (two) times daily.  . NON FORMULARY Diet type:  NAS  . polyethylene glycol (MIRALAX / GLYCOLAX) packet Take 17 g by mouth daily as needed.   . potassium chloride (K-DUR,KLOR-CON) 10 MEQ tablet Take 10 mEq by mouth daily.  Marland Kitchen senna (SENOKOT) 8.6 MG TABS tablet Take 2 tablets by mouth daily. For Constipation  . sennosides-docusate sodium (SENOKOT-S) 8.6-50 MG tablet Take 1 tablet by mouth at bedtime. For Constipation  . SYMBICORT 80-4.5 MCG/ACT inhaler Inhale 2 puffs into the lungs 2 (two) times daily.    No facility-administered encounter medications on file as of 10/20/2020.     SIGNIFICANT DIAGNOSTIC EXAMS   PREVIOUS  09-07-19: DEXA: t score -1.06  NO NEW EXAMS.   LABS REVIEWED PREVIOUS  02-04-20: wbc 4.8; hgb 12.8; hct 40.2; mcv 94.1 plt 170; glucose 88; bun 25; creat 0.72; k+ 3.7; na++ 141; ca 9.0; liver normal albumin 4.0 chol 167 ldl 102 trig 75 hdl 50 07-25-20: wbc 4.9; hgb 12.1; hct 38.5; mcv 95.1 plt 142; glucose 85; bun 20; creat 0.70; k+ 3.9; na++ 142; ca 8.4 liver normal albumin 3.5   NO NEW LABS.    Review of Systems  Constitutional: Negative for malaise/fatigue.  Respiratory: Negative for cough and shortness of breath.   Cardiovascular: Negative for chest pain, palpitations and leg swelling.  Gastrointestinal: Negative for abdominal pain, constipation and heartburn.  Musculoskeletal: Negative for back pain, joint pain and myalgias.  Skin: Negative.   Neurological: Negative for dizziness.  Psychiatric/Behavioral: The patient is not  nervous/anxious.     Physical Exam Constitutional:      General: She is not in acute distress.    Appearance: She is well-developed and well-nourished. She is obese. She is not diaphoretic.  Neck:     Thyroid: No thyromegaly.  Cardiovascular:     Rate and Rhythm: Normal rate and regular rhythm.     Pulses: Normal pulses and intact distal pulses.     Heart sounds: Normal heart sounds.  Pulmonary:     Effort: Pulmonary effort is normal. No respiratory distress.  Breath sounds: Normal breath sounds.  Abdominal:     General: Bowel sounds are normal. There is no distension.     Palpations: Abdomen is soft.     Tenderness: There is no abdominal tenderness.  Musculoskeletal:        General: No edema.     Cervical back: Neck supple.     Right lower leg: No edema.     Left lower leg: No edema.     Comments: Is able to move all extremities Uses wheelchair  2016: left hip fracture left hip replacement    Lymphadenopathy:     Cervical: No cervical adenopathy.  Skin:    General: Skin is warm and dry.  Neurological:     Mental Status: She is alert. Mental status is at baseline.  Psychiatric:        Mood and Affect: Mood and affect and mood normal.     ASSESSMENT/ PLAN:  TODAY  1. Late onset alzheimer's disease with behavioral disturbance 2. Seizure disorder 3. Simple chronic bronchitis  Will continue current medications Will continue current plan of care Will continue to monitor her status.   MD is aware of resident's narcotic use and is in agreement with current plan of care. We will attempt to wean resident as appropriate.  Synthia Innocent NP [facility]  Contact [phone] Monday through Friday 8am- 5pm  After hours call [phone]

## 2020-11-02 ENCOUNTER — Non-Acute Institutional Stay (SKILLED_NURSING_FACILITY): Payer: Medicare PPO | Admitting: Internal Medicine

## 2020-11-02 ENCOUNTER — Encounter: Payer: Self-pay | Admitting: Internal Medicine

## 2020-11-02 DIAGNOSIS — E441 Mild protein-calorie malnutrition: Secondary | ICD-10-CM

## 2020-11-02 DIAGNOSIS — I10 Essential (primary) hypertension: Secondary | ICD-10-CM | POA: Diagnosis not present

## 2020-11-02 DIAGNOSIS — E785 Hyperlipidemia, unspecified: Secondary | ICD-10-CM | POA: Diagnosis not present

## 2020-11-02 DIAGNOSIS — E46 Unspecified protein-calorie malnutrition: Secondary | ICD-10-CM | POA: Insufficient documentation

## 2020-11-02 DIAGNOSIS — D696 Thrombocytopenia, unspecified: Secondary | ICD-10-CM

## 2020-11-02 DIAGNOSIS — G40909 Epilepsy, unspecified, not intractable, without status epilepticus: Secondary | ICD-10-CM

## 2020-11-02 DIAGNOSIS — M549 Dorsalgia, unspecified: Secondary | ICD-10-CM | POA: Diagnosis not present

## 2020-11-02 DIAGNOSIS — B342 Coronavirus infection, unspecified: Secondary | ICD-10-CM

## 2020-11-02 NOTE — Progress Notes (Signed)
NURSING HOME LOCATION:  [facility] ROOM NUMBER:  116/D  CODE STATUS: Full Code   PCP:  Sharee Holster, NP  This is a nursing facility follow up visit of chronic medical diagnoses & to document compliance with Regulation 483.30 (c) in The Long Term Care Survey Manual Phase 2 which mandates caregiver visit ( visits can alternate among physician, PA or NP as per statutes) within 10 days of 30 days / 60 days/ 90 days post admission to SNF date    Interim medical record and care since last [facility] visit was updated with review of diagnostic studies and change in clinical status since last visit were documented.  HPI: She is a permanent resident facility with medical diagnoses of essential hypertension, history of depression, COPD, history of seizure disorder, dyslipidemia, RLS, GERD, H. pylori gastritis by history, history of diverticulosis, and neurocognitive disorder. Surgeries and procedures include abdominal hysterectomy, ORIF hip fracture, and vesicovaginal fistula closure.  Review of systems: She describes acute right scaphoid area pain today after exercising.  Staff maintains that she does not engage in any exercise program.  She also has chronic constipation.  Otherwise she denies any positive symptomatology.  In fact she states that "I am all right except my back".  Constitutional: No fever, significant weight change, fatigue  Eyes: No redness, discharge, pain, vision change ENT/mouth: No nasal congestion,  purulent discharge, earache, change in hearing, sore throat  Cardiovascular: No chest pain, palpitations, paroxysmal nocturnal dyspnea, claudication, edema  Respiratory: No cough, sputum production, hemoptysis,  significant snoring, apnea   Gastrointestinal: No heartburn, dysphagia, abdominal pain, nausea /vomiting, rectal bleeding, melena Genitourinary: No dysuria, hematuria, pyuria, incontinence, nocturia Dermatologic: No rash, pruritus,  change in appearance of skin Neurologic: No dizziness, headache, syncope, seizures, numbness, tingling Psychiatric: No significant anxiety, depression, insomnia, anorexia Endocrine: No change in hair/skin/nails, excessive thirst, excessive hunger, excessive urination  Hematologic/lymphatic: No significant bruising, lymphadenopathy, abnormal bleeding Allergy/immunology: No itchy/watery eyes, significant sneezing, urticaria, angioedema  Physical exam:  Pertinent or positive findings: She is markedly hard of hearing.  Facies are blank.  Her speech is somewhat slurred.  She has a faint systolic murmur at the base.  Abdomen is protuberant.  Pedal pulses are decreased.  She has slight contractures of some of the fingers greater on the left than the right.  The upper extremities are stronger to opposition than the lower extremities.  She validates tenderness over the right upper back in the scapular area.  General appearance: Adequately nourished; no acute distress, increased work of breathing is present.   Lymphatic: No lymphadenopathy about the head, neck, axilla. Eyes: No conjunctival inflammation or lid edema is present. There is no scleral icterus. Ears:  External ear exam shows no significant lesions or deformities.   Nose:  External nasal examination shows no deformity or inflammation. Nasal mucosa are pink and moist without lesions, exudates Oral exam:  Lips and gums are healthy appearing. There is no oropharyngeal erythema or exudate. Neck:  No thyromegaly, masses, tenderness noted.    Heart:  Normal rate and regular rhythm. S1 and S2 normal without gallop, click, rub .  Lungs:  without wheezes, rhonchi, rales, rubs. Abdomen: Bowel sounds are normal. Abdomen is soft and nontender with no organomegaly, hernias, masses. GU: Deferred  Extremities:  No cyanosis, clubbing, edema  Neurologic exam :Balance, Rhomberg, finger to nose testing could not be completed due to clinical state Skin: Warm &  dry w/o tenting. No significant lesions or  rash.  See summary under each active problem in the Problem List with associated updated therapeutic plan

## 2020-11-02 NOTE — Assessment & Plan Note (Signed)
BP controlled; no change in antihypertensive medications  

## 2020-11-02 NOTE — Assessment & Plan Note (Signed)
No bleeding dyscrasias reported 

## 2020-11-02 NOTE — Assessment & Plan Note (Signed)
No seizures reported on present regimen.  No change indicated. 

## 2020-11-02 NOTE — Assessment & Plan Note (Addendum)
07/25/2020 total protein 6.3; albumin low normal at 3.5. Clinically no muscle wasting present

## 2020-11-02 NOTE — Patient Instructions (Signed)
See assessment and plan under each diagnosis in the problem list and acutely for this visit 

## 2020-11-02 NOTE — Assessment & Plan Note (Signed)
07/25/2020 LDL 87 indicating adequate control in this octogenarian.  No change in regimen indicated.

## 2020-11-04 NOTE — Assessment & Plan Note (Signed)
No evidence of current infection or long term complications

## 2020-11-29 ENCOUNTER — Encounter: Payer: Self-pay | Admitting: Adult Health

## 2020-11-29 ENCOUNTER — Non-Acute Institutional Stay (SKILLED_NURSING_FACILITY): Payer: Medicare PPO | Admitting: Adult Health

## 2020-11-29 DIAGNOSIS — I10 Essential (primary) hypertension: Secondary | ICD-10-CM

## 2020-11-29 DIAGNOSIS — J410 Simple chronic bronchitis: Secondary | ICD-10-CM | POA: Diagnosis not present

## 2020-11-29 DIAGNOSIS — Z86718 Personal history of other venous thrombosis and embolism: Secondary | ICD-10-CM | POA: Insufficient documentation

## 2020-11-29 DIAGNOSIS — F02818 Dementia in other diseases classified elsewhere, unspecified severity, with other behavioral disturbance: Secondary | ICD-10-CM

## 2020-11-29 DIAGNOSIS — G301 Alzheimer's disease with late onset: Secondary | ICD-10-CM

## 2020-11-29 DIAGNOSIS — Z8781 Personal history of (healed) traumatic fracture: Secondary | ICD-10-CM | POA: Insufficient documentation

## 2020-11-29 DIAGNOSIS — F0281 Dementia in other diseases classified elsewhere with behavioral disturbance: Secondary | ICD-10-CM

## 2020-11-29 NOTE — Progress Notes (Signed)
Location:  [facility] Room Number: 116-D Place of Service:  SNF (31)   CODE STATUS: Full Code  No Known Allergies  Chief Complaint  Patient presents with  . Medical Management of Chronic Issues           Essential hypertension:Late onset alzheimer's disease without behavioral disturbance: Simple chronic bronchitis:     HPI:  She is a 87 year old long term resident of this facility being seen for the management of her chronic illnesses:  Essential hypertension:Late onset alzheimer's disease without behavioral disturbance: Simple chronic bronchitis: she has a medical history of diverticulosis; gastritis; left hip fracture. She is doing well overall. There have been no recent falls; her weight is stable. There are no reports of cough or shortness of breath no reports of uncontrolled pain.   Past Medical History:  Diagnosis Date  . Diverticulosis OCT 2010 TCS   SIGMOID COLON  . Esophageal web 2010   DILATION 16 MM  . Gastritis, Helicobacter pylori 2010   Rx. ABO for 10 days   . GERD (gastroesophageal reflux disease)   . Hip fracture, left (HCC)    2016  . Hyperlipidemia   . Hypertension   . Restless legs syndrome   . Seizures [facility]) January 2010   Post head trauma in fall .sees Dr Gerilyn Pilgrim    Past Surgical History:  Procedure Laterality Date  . ABDOMINAL HYSTERECTOMY    . COLONOSCOPY  OCT 2010   TORTUOUS, Sml IH, Tics, MULTIPLE SIMPLE ADENOMAS (<6MM)  . HIP ARTHROPLASTY Left 11/19/2014   Procedure: LEFT PARTIAL HIP REPLACEMENT;  Surgeon: Vickki Hearing, MD;  Location: [facility];  Service: Orthopedics;  Laterality: Left;  . ORIF HIP FRACTURE  04/05/2012   Procedure: OPEN REDUCTION INTERNAL FIXATION HIP;  Surgeon: Vickki Hearing, MD;  Location: [facility];  Service: Orthopedics;  Laterality: Right;  . UPPER GASTROINTESTINAL ENDOSCOPY  AUG 2010   SAVARY  . VESICOVAGINAL FISTULA CLOSURE W/ TAH  1966   bleeding     Social History   Socioeconomic  History  . Marital status: Widowed    Spouse name: Not on file  . Number of children: Not on file  . Years of education: Not on file  . Highest education level: Not on file  Occupational History  . 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 agent   Tobacco Use  . Smoking status: Never Smoker  . Smokeless tobacco: Never Used  Vaping Use  . Vaping Use: Never used  Substance and Sexual Activity  . Alcohol use: No  . Drug use: No  . Sexual activity: Never  Other Topics Concern  . Not on file  Social History Narrative  . Not on file   Social Determinants of Health   Financial Resource Strain: Not on file  Food Insecurity: Not on file  Transportation Needs: Not on file  Physical Activity: Not on file  Stress: Not on file  Social Connections: Not on file  Intimate Partner Violence: Not on file   Family History  Problem Relation Age of Onset  . Cancer Mother        bladder   . Diabetes Mother   . Diabetes Father   . Emphysema Father   . GER disease Brother   . Colon cancer Neg Hx   . Colon polyps Neg Hx       VITAL SIGNS BP (!) 145/78   Pulse (!) 59   Temp (!) 97.3 F (36.3 C)  Resp 20   Ht 5\' 7"  (1.702 m)   Wt 249 lb 3.2 oz (113 kg)   SpO2 92%   BMI 39.03 kg/m   Outpatient Encounter Medications as of 11/29/2020  Medication Sig  . acetaminophen (TYLENOL) 650 MG CR tablet Take 1,300 mg by mouth daily.   12/01/2020 aspirin 81 MG chewable tablet Chew 81 mg by mouth daily.  Marland Kitchen [place]-Castor Oil [facility]) OINT Special Instructions: Apply to sacrum, coccyx and bilateral buttocks qshift for prevention.  . calcium carbonate (TUMS - DOSED IN MG ELEMENTAL CALCIUM) 500 MG chewable tablet Chew 1 tablet by mouth 3 (three) times daily. DX age related osteoporosis without current pathological fracture 07/10/2018 per Dr.Gupta note  . FLUoxetine (PROZAC) 20 MG tablet Take 20 mg by mouth daily.   . Foot Care Products (CORN CUSHIONS) PADS Apply 1 Device topically every other  day. Apply pad to 2nd toe of R foot every other day until healed  . hydrochlorothiazide (HYDRODIURIL) 12.5 MG tablet Take 12.5 mg by mouth daily.  09/09/2018 ipratropium-albuterol (DUONEB) 0.5-2.5 (3) MG/3ML SOLN Take 3 mLs by nebulization every 6 (six) hours as needed.  . levETIRAcetam (KEPPRA) 500 MG tablet TAKE 1 TABLET BY MOUTH TWICE A DAY AS DIRECTED.  Marland Kitchen lovastatin (MEVACOR) 20 MG tablet Take 1 tablet (20 mg total) by mouth at bedtime.  . Melatonin 5 MG TABS Take 5 mg by mouth at bedtime.  . memantine (NAMENDA) 5 MG tablet Take 5 mg by mouth 2 (two) times daily.  . NON FORMULARY Diet type:  NAS  . polyethylene glycol (MIRALAX / GLYCOLAX) packet Take 17 g by mouth daily as needed.   . potassium chloride (K-DUR,KLOR-CON) 10 MEQ tablet Take 10 mEq by mouth daily.  Marland Kitchen senna (SENOKOT) 8.6 MG TABS tablet Take 2 tablets by mouth daily. For Constipation  . sennosides-docusate sodium (SENOKOT-S) 8.6-50 MG tablet Take 1 tablet by mouth at bedtime. For Constipation  . SYMBICORT 80-4.5 MCG/ACT inhaler Inhale 2 puffs into the lungs 2 (two) times daily.    No facility-administered encounter medications on file as of 11/29/2020.     SIGNIFICANT DIAGNOSTIC EXAMS   PREVIOUS  09-07-19: DEXA: t score -1.06  NO NEW EXAMS.   LABS REVIEWED PREVIOUS  02-04-20: wbc 4.8; hgb 12.8; hct 40.2; mcv 94.1 plt 170; glucose 88; bun 25; creat 0.72; k+ 3.7; na++ 141; ca 9.0; liver normal albumin 4.0 chol 167 ldl 102 trig 75 hdl 50 07-25-20: wbc 4.9; hgb 12.1; hct 38.5; mcv 95.1 plt 142; glucose 85; bun 20; creat 0.70; k+ 3.9; na++ 142; ca 8.4 liver normal albumin 3.5   NO NEW LABS.    Review of Systems  Constitutional: Negative for malaise/fatigue.  Respiratory: Negative for cough and shortness of breath.   Cardiovascular: Negative for chest pain, palpitations and leg swelling.  Gastrointestinal: Negative for abdominal pain, constipation and heartburn.  Musculoskeletal: Negative for back pain, joint pain and myalgias.   Skin: Negative.   Neurological: Negative for dizziness.  Psychiatric/Behavioral: The patient is not nervous/anxious.     Physical Exam Constitutional:      General: She is not in acute distress.    Appearance: She is well-developed and well-nourished. She is obese. She is not diaphoretic.  Neck:     Thyroid: No thyromegaly.  Cardiovascular:     Rate and Rhythm: Normal rate and regular rhythm.     Pulses: Intact distal pulses.     Heart sounds: Normal heart sounds.  Pulmonary:  Effort: Pulmonary effort is normal. No respiratory distress.     Breath sounds: Normal breath sounds.  Abdominal:     General: Bowel sounds are normal. There is no distension.     Palpations: Abdomen is soft.     Tenderness: There is no abdominal tenderness.  Musculoskeletal:        General: No edema.     Cervical back: Neck supple.     Right lower leg: No edema.     Left lower leg: No edema.     Comments: Is able to move all extremities  2016: left hip fracture left hip replacement     Lymphadenopathy:     Cervical: No cervical adenopathy.  Skin:    General: Skin is warm and dry.  Neurological:     Mental Status: She is alert. Mental status is at baseline.  Psychiatric:        Mood and Affect: Mood and affect and mood normal.     ASSESSMENT/ PLAN:  TODAY:   1. Essential hypertension: b/o 149/78 (recheck) she is presently stable will continue hctz 12.5 mg daily   2. 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 She continues to come out of room daily; will continue namenda 5 mg twice daily   3. Simple chronic bronchitis: is stable without recent flares will continue symbicort 80/4.5 mcg 2 puffs twice daily   PREVIOUS   4. Chronic constipation: is stable will continue colace 200 mg daily senna 2 tabs daily miralax daily as needed  5. Hypokalemia: is stable k+ 3.9 will continue k+ 10 meq daily   6. Unspecified hyperlipidemia: is  stable LDL 87 will continue mevacor 20 mg daily    7. Obesity BMI 30.0-39.9: BMI 39.03 will monitor   8. Unspecified obsessive compulsive disorder: is stable will continue prozac 20 mg daily and melatonin 5 mg daily   9. Chronic generalized pain: is stable will continue tylenol 1300 mg daily   10. Seizure disorder: (after CHI) is stable no reports of seizure activity will continue keppra 500 mg twice daily   11. Thrombocytopenia: is stable her platelets are 142; will continue to monitor her status.    MD is aware of resident's narcotic use and is in agreement with current plan of care. We will attempt to wean resident as appropriate.  Synthia Innocent NP [facility]  Contact [phone] Monday through Friday 8am- 5pm  After hours call [phone]

## 2020-12-12 ENCOUNTER — Encounter: Payer: Self-pay | Admitting: Internal Medicine

## 2020-12-26 ENCOUNTER — Non-Acute Institutional Stay (SKILLED_NURSING_FACILITY): Payer: Medicare PPO | Admitting: Adult Health

## 2020-12-26 ENCOUNTER — Encounter: Payer: Self-pay | Admitting: Adult Health

## 2020-12-26 DIAGNOSIS — E876 Hypokalemia: Secondary | ICD-10-CM

## 2020-12-26 DIAGNOSIS — K5909 Other constipation: Secondary | ICD-10-CM

## 2020-12-26 DIAGNOSIS — E785 Hyperlipidemia, unspecified: Secondary | ICD-10-CM | POA: Diagnosis not present

## 2020-12-26 NOTE — Progress Notes (Signed)
Location:  [facility] Room Number: 116/D Place of Service:  SNF (31)   CODE STATUS: Full Code  No Known Allergies  Chief Complaint  Patient presents with  . Medical Management of Chronic Issues          Chronic constipation:   Hypokalemia:  Unspecified hyperlipidemia     HPI:  She is a 87 year old long term resident of this facility being seen for the management of her chronic illnesses:Chronic constipation:   Hypokalemia:  Unspecified hyperlipidemia. There are no reports of uncontrolled pain; no changes in appetite; no reports of insomnia or anxiety.    Past Medical History:  Diagnosis Date  . Diverticulosis OCT 2010 TCS   SIGMOID COLON  . Esophageal web 2010   DILATION 16 MM  . Gastritis, Helicobacter pylori 2010   Rx. ABO for 10 days   . GERD (gastroesophageal reflux disease)   . Hip fracture, left (HCC)    2016  . Hyperlipidemia   . Hypertension   . Restless legs syndrome   . Seizures [facility]) January 2010   Post head trauma in fall .sees Dr Gerilyn Pilgrim    Past Surgical History:  Procedure Laterality Date  . ABDOMINAL HYSTERECTOMY    . COLONOSCOPY  OCT 2010   TORTUOUS, Sml IH, Tics, MULTIPLE SIMPLE ADENOMAS (<6MM)  . HIP ARTHROPLASTY Left 11/19/2014   Procedure: LEFT PARTIAL HIP REPLACEMENT;  Surgeon: Vickki Hearing, MD;  Location: [facility];  Service: Orthopedics;  Laterality: Left;  . ORIF HIP FRACTURE  04/05/2012   Procedure: OPEN REDUCTION INTERNAL FIXATION HIP;  Surgeon: Vickki Hearing, MD;  Location: [facility];  Service: Orthopedics;  Laterality: Right;  . UPPER GASTROINTESTINAL ENDOSCOPY  AUG 2010   SAVARY  . VESICOVAGINAL FISTULA CLOSURE W/ TAH  1966   bleeding     Social History   Socioeconomic History  . Marital status: Widowed    Spouse name: Not on file  . Number of children: Not on file  . Years of education: Not on file  . Highest education level: Not on file  Occupational History  . Occupation: custodian / nutritionist  -Estate agent   Tobacco Use  . Smoking status: Never Smoker  . Smokeless tobacco: Never Used  Vaping Use  . Vaping Use: Never used  Substance and Sexual Activity  . Alcohol use: No  . Drug use: No  . Sexual activity: Never  Other Topics Concern  . Not on file  Social History Narrative  . Not on file   Social Determinants of Health   Financial Resource Strain: Not on file  Food Insecurity: Not on file  Transportation Needs: Not on file  Physical Activity: Not on file  Stress: Not on file  Social Connections: Not on file  Intimate Partner Violence: Not on file   Family History  Problem Relation Age of Onset  . Cancer Mother        bladder   . Diabetes Mother   . Diabetes Father   . Emphysema Father   . GER disease Brother   . Colon cancer Neg Hx   . Colon polyps Neg Hx       VITAL SIGNS BP (!) 156/84   Pulse 74   Temp 98.2 F (36.8 C)   Resp 20   Ht 5\' 7"  (1.702 m)   Wt 255 lb 3.2 oz (115.8 kg)   BMI 39.97 kg/m   Outpatient Encounter Medications as of 12/26/2020  Medication Sig  .  acetaminophen (TYLENOL) 650 MG CR tablet Take 1,300 mg by mouth daily.   Marland Kitchen aspirin 81 MG chewable tablet Chew 81 mg by mouth daily.  Lucilla Lame [place]-Castor Oil [facility]) OINT Special Instructions: Apply to sacrum, coccyx and bilateral buttocks qshift for prevention.  . calcium carbonate (TUMS - DOSED IN MG ELEMENTAL CALCIUM) 500 MG chewable tablet Chew 1 tablet by mouth 3 (three) times daily. DX age related osteoporosis without current pathological fracture 07/10/2018 per Dr.Gupta note  . FLUoxetine (PROZAC) 20 MG tablet Take 10 mg by mouth daily.  . Foot Care Products (CORN CUSHIONS) PADS Apply 1 Device topically every other day. Apply pad to 2nd toe of R foot every other day until healed  . hydrochlorothiazide (HYDRODIURIL) 12.5 MG tablet Take 12.5 mg by mouth daily.  Marland Kitchen levETIRAcetam (KEPPRA) 500 MG tablet TAKE 1 TABLET BY MOUTH TWICE A DAY AS DIRECTED.  Marland Kitchen lovastatin (MEVACOR)  20 MG tablet Take 1 tablet (20 mg total) by mouth at bedtime.  . Melatonin 5 MG TABS Take 5 mg by mouth at bedtime.  . memantine (NAMENDA) 5 MG tablet Take 5 mg by mouth 2 (two) times daily.  . NON FORMULARY Diet type:  NAS  . potassium chloride (K-DUR,KLOR-CON) 10 MEQ tablet Take 10 mEq by mouth daily.  . Salicylic Acid 40 % PADS Apply topically every other day. Special Instructions: to 2nd digit right foot  . senna (SENOKOT) 8.6 MG TABS tablet Take 2 tablets by mouth daily. For Constipation  . sennosides-docusate sodium (SENOKOT-S) 8.6-50 MG tablet Take 1 tablet by mouth at bedtime. For Constipation  . SYMBICORT 80-4.5 MCG/ACT inhaler Inhale 2 puffs into the lungs 2 (two) times daily.   . [DISCONTINUED] ipratropium-albuterol (DUONEB) 0.5-2.5 (3) MG/3ML SOLN Take 3 mLs by nebulization every 6 (six) hours as needed.  . [DISCONTINUED] polyethylene glycol (MIRALAX / GLYCOLAX) packet Take 17 g by mouth daily as needed.    No facility-administered encounter medications on file as of 12/26/2020.     SIGNIFICANT DIAGNOSTIC EXAMS   PREVIOUS  09-07-19: DEXA: t score -1.06  NO NEW EXAMS.   LABS REVIEWED PREVIOUS  02-04-20: wbc 4.8; hgb 12.8; hct 40.2; mcv 94.1 plt 170; glucose 88; bun 25; creat 0.72; k+ 3.7; na++ 141; ca 9.0; liver normal albumin 4.0 chol 167 ldl 102 trig 75 hdl 50 07-25-20: wbc 4.9; hgb 12.1; hct 38.5; mcv 95.1 plt 142; glucose 85; bun 20; creat 0.70; k+ 3.9; na++ 142; ca 8.4 liver normal albumin 3.5   NO NEW LABS.    Review of Systems  Constitutional: Negative for malaise/fatigue.  Respiratory: Negative for cough and shortness of breath.   Cardiovascular: Negative for chest pain, palpitations and leg swelling.  Gastrointestinal: Negative for abdominal pain, constipation and heartburn.  Musculoskeletal: Negative for back pain, joint pain and myalgias.  Skin: Negative.   Neurological: Negative for dizziness.  Psychiatric/Behavioral: The patient is not nervous/anxious.      Physical Exam Constitutional:      General: She is not in acute distress.    Appearance: She is well-developed. She is obese. She is not diaphoretic.  Neck:     Thyroid: No thyromegaly.  Cardiovascular:     Rate and Rhythm: Normal rate and regular rhythm.     Pulses: Normal pulses.     Heart sounds: Normal heart sounds.  Pulmonary:     Effort: Pulmonary effort is normal. No respiratory distress.     Breath sounds: Normal breath sounds.  Abdominal:  General: Bowel sounds are normal. There is no distension.     Palpations: Abdomen is soft.     Tenderness: There is no abdominal tenderness.  Musculoskeletal:     Cervical back: Neck supple.     Right lower leg: No edema.     Left lower leg: No edema.     Comments:  Is able to move all extremities  2016: left hip fracture left hip replacement   Lymphadenopathy:     Cervical: No cervical adenopathy.  Skin:    General: Skin is warm and dry.  Neurological:     Mental Status: She is alert. Mental status is at baseline.  Psychiatric:        Mood and Affect: Mood normal.      ASSESSMENT/ PLAN:  TODAY:   1. Chronic constipation: is stable will continue colace 200 mg daily; senna 2 tabs daily and 1 tab nightly   2. Hypokalemia: is stable k+ 3.9 will continue  10 meq daily   3. Unspecified hyperlipidemia: is stable LDL 87 will continue mevacor 20 mg daily   PREVIOUS   4. Obesity BMI 30.0-39.9: BMI 39.97 will monitor   5. Unspecified obsessive compulsive disorder: is stable will continue prozac 10 mg daily and melatonin 5 mg daily   6. Chronic generalized pain: is stable will continue tylenol 1300 mg daily   7. Seizure disorder: (after CHI) is stable no reports of seizure activity will continue keppra 500 mg twice daily   8. Thrombocytopenia: is stable her platelets are 142; will continue to monitor her status.   9. Essential hypertension: b/o 156/84 she is presently stable will continue hctz 12.5 mg daily   10.  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 She continues to come out of room daily; will continue namenda 5 mg twice daily   11. Simple chronic bronchitis: is stable without recent flares will continue symbicort 80/4.5 mcg 2 puffs twice daily     Synthia Innocent NP [facility]  Contact [phone] Monday through Friday 8am- 5pm  After hours call [phone]

## 2021-01-19 ENCOUNTER — Encounter: Payer: Self-pay | Admitting: Adult Health

## 2021-01-19 ENCOUNTER — Non-Acute Institutional Stay (SKILLED_NURSING_FACILITY): Payer: Medicare PPO | Admitting: Adult Health

## 2021-01-19 DIAGNOSIS — F0281 Dementia in other diseases classified elsewhere with behavioral disturbance: Secondary | ICD-10-CM | POA: Diagnosis not present

## 2021-01-19 DIAGNOSIS — G40909 Epilepsy, unspecified, not intractable, without status epilepticus: Secondary | ICD-10-CM | POA: Diagnosis not present

## 2021-01-19 DIAGNOSIS — G301 Alzheimer's disease with late onset: Secondary | ICD-10-CM

## 2021-01-19 DIAGNOSIS — J410 Simple chronic bronchitis: Secondary | ICD-10-CM | POA: Diagnosis not present

## 2021-01-19 DIAGNOSIS — F02818 Dementia in other diseases classified elsewhere, unspecified severity, with other behavioral disturbance: Secondary | ICD-10-CM

## 2021-01-19 NOTE — Progress Notes (Signed)
Location:  [facility] Room Number: 222 Place of Service:  SNF (31)   CODE STATUS: full code   No Known Allergies  Chief Complaint  Patient presents with  . Acute Visit    Care plan meeting     HPI:  We have come to together for her care plan  BIMS 8/15 mood 0/30. She has one fall without injury. She requires extensive assist with adls. She is frequently incontinent of bladder and bowel. She is able to feed herself; is nonambulatory. She is more confused; more irritated; and mean since lowering prozac  Therapy  Non has shuffling gait;   Weight gained 6 pounds in past 6 months weight is 259 pounds; fast eater. She continues to be followed for her chronic illnesses including: Late onset alzheimer's disease with behavioral disturbance  Chronic simple bronchitis  Seizures  Past Medical History:  Diagnosis Date  . Diverticulosis OCT 2010 TCS   SIGMOID COLON  . Esophageal web 2010   DILATION 16 MM  . Gastritis, Helicobacter pylori 2010   Rx. ABO for 10 days   . GERD (gastroesophageal reflux disease)   . Hip fracture, left (HCC)    2016  . Hyperlipidemia   . Hypertension   . Restless legs syndrome   . Seizures [facility]) January 2010   Post head trauma in fall .sees Dr Gerilyn Pilgrim    Past Surgical History:  Procedure Laterality Date  . ABDOMINAL HYSTERECTOMY    . COLONOSCOPY  OCT 2010   TORTUOUS, Sml IH, Tics, MULTIPLE SIMPLE ADENOMAS (<6MM)  . HIP ARTHROPLASTY Left 11/19/2014   Procedure: LEFT PARTIAL HIP REPLACEMENT;  Surgeon: Vickki Hearing, MD;  Location: [facility];  Service: Orthopedics;  Laterality: Left;  . ORIF HIP FRACTURE  04/05/2012   Procedure: OPEN REDUCTION INTERNAL FIXATION HIP;  Surgeon: Vickki Hearing, MD;  Location: [facility];  Service: Orthopedics;  Laterality: Right;  . UPPER GASTROINTESTINAL ENDOSCOPY  AUG 2010   SAVARY  . VESICOVAGINAL FISTULA CLOSURE W/ TAH  1966   bleeding     Social History   Socioeconomic History  . Marital  status: Widowed    Spouse name: Not on file  . Number of children: Not on file  . Years of education: Not on file  . Highest education level: Not on file  Occupational History  . 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   Tobacco Use  . Smoking status: Never Smoker  . Smokeless tobacco: Never Used  Vaping Use  . Vaping Use: Never used  Substance and Sexual Activity  . Alcohol use: No  . Drug use: No  . Sexual activity: Never  Other Topics Concern  . Not on file  Social History Narrative  . Not on file   Social Determinants of Health   Financial Resource Strain: Not on file  Food Insecurity: Not on file  Transportation Needs: Not on file  Physical Activity: Not on file  Stress: Not on file  Social Connections: Not on file  Intimate Partner Violence: Not on file   Family History  Problem Relation Age of Onset  . Cancer Mother        bladder   . Diabetes Mother   . Diabetes Father   . Emphysema Father   . GER disease Brother   . Colon cancer Neg Hx   . Colon polyps Neg Hx       VITAL SIGNS BP (!) 125/59   Pulse 69   Temp  97.8 F (36.6 C)   Resp 20   Ht 5\' 7"  (1.702 m)   Wt 259 lb (117.5 kg)   BMI 40.57 kg/m   Outpatient Encounter Medications as of 01/19/2021  Medication Sig  . acetaminophen (TYLENOL) 650 MG CR tablet Take 1,300 mg by mouth daily.   01/21/2021 aspirin 81 MG chewable tablet Chew 81 mg by mouth daily.  Marland Kitchen [place] [facility]) OINT Special Instructions: Apply to sacrum, coccyx and bilateral buttocks qshift for prevention.  . calcium carbonate (TUMS - DOSED IN MG ELEMENTAL CALCIUM) 500 MG chewable tablet Chew 1 tablet by mouth 3 (three) times daily. DX age related osteoporosis without current pathological fracture 07/10/2018 per Dr.Gupta note  . FLUoxetine (PROZAC) 20 MG tablet Take 10 mg by mouth daily.  . Foot Care Products (CORN CUSHIONS) PADS Apply 1 Device topically every other day. Apply pad to 2nd toe of R foot every other  day until healed  . hydrochlorothiazide (HYDRODIURIL) 12.5 MG tablet Take 12.5 mg by mouth daily.  09/09/2018 levETIRAcetam (KEPPRA) 500 MG tablet TAKE 1 TABLET BY MOUTH TWICE A DAY AS DIRECTED.  Marland Kitchen lovastatin (MEVACOR) 20 MG tablet Take 1 tablet (20 mg total) by mouth at bedtime.  . Melatonin 5 MG TABS Take 5 mg by mouth at bedtime.  . memantine (NAMENDA) 5 MG tablet Take 5 mg by mouth 2 (two) times daily.  . NON FORMULARY Diet type:  NAS  . potassium chloride (K-DUR,KLOR-CON) 10 MEQ tablet Take 10 mEq by mouth daily.  . Salicylic Acid 40 % PADS Apply topically every other day. Special Instructions: to 2nd digit right foot  . senna (SENOKOT) 8.6 MG TABS tablet Take 2 tablets by mouth daily. For Constipation  . sennosides-docusate sodium (SENOKOT-S) 8.6-50 MG tablet Take 1 tablet by mouth at bedtime. For Constipation  . SYMBICORT 80-4.5 MCG/ACT inhaler Inhale 2 puffs into the lungs 2 (two) times daily.    No facility-administered encounter medications on file as of 01/19/2021.     SIGNIFICANT DIAGNOSTIC EXAMS   PREVIOUS  09-07-19: DEXA: t score -1.06  NO NEW EXAMS.   LABS REVIEWED PREVIOUS  02-04-20: wbc 4.8; hgb 12.8; hct 40.2; mcv 94.1 plt 170; glucose 88; bun 25; creat 0.72; k+ 3.7; na++ 141; ca 9.0; liver normal albumin 4.0 chol 167 ldl 102 trig 75 hdl 50 07-25-20: wbc 4.9; hgb 12.1; hct 38.5; mcv 95.1 plt 142; glucose 85; bun 20; creat 0.70; k+ 3.9; na++ 142; ca 8.4 liver normal albumin 3.5   NO NEW LABS.    Review of Systems  Constitutional: Negative for malaise/fatigue.  Respiratory: Negative for cough and shortness of breath.   Cardiovascular: Negative for chest pain, palpitations and leg swelling.  Gastrointestinal: Negative for abdominal pain, constipation and heartburn.  Musculoskeletal: Negative for back pain, joint pain and myalgias.  Skin: Negative.   Neurological: Negative for dizziness.  Psychiatric/Behavioral: The patient is not nervous/anxious.    Physical  Exam Constitutional:      General: She is not in acute distress.    Appearance: She is well-developed. She is not diaphoretic.  Neck:     Thyroid: No thyromegaly.  Cardiovascular:     Rate and Rhythm: Normal rate and regular rhythm.     Pulses: Normal pulses.     Heart sounds: Normal heart sounds.  Pulmonary:     Effort: Pulmonary effort is normal. No respiratory distress.     Breath sounds: Normal breath sounds.  Abdominal:     General: Bowel  sounds are normal. There is no distension.     Palpations: Abdomen is soft.     Tenderness: There is no abdominal tenderness.  Musculoskeletal:     Cervical back: Neck supple.     Right lower leg: No edema.     Left lower leg: No edema.     Comments:  Is able to move all extremities  2016: left hip fracture left hip replacement    Lymphadenopathy:     Cervical: No cervical adenopathy.  Skin:    General: Skin is warm and dry.  Neurological:     Mental Status: She is alert. Mental status is at baseline.  Psychiatric:        Mood and Affect: Mood normal.      ASSESSMENT/ PLAN:  TODAY  1. Late onset alzheimer's disease with behavioral disturbance 2. Chronic simple bronchitis 3. Seizures  Will setup neurology consult for questionable parkinson's has shuffling gait.  Will increase prozac back to 20 mg daily  Will continue current plan of care She is due for the following labs: cbc; cmp lipids Will continue to monitor her status.  Time spent with patient 40 minutes: >50% spent with coordination of care to include medications lab work due; overall status.   Synthia Innocent NP [facility]  Contact [phone] Monday through Friday 8am- 5pm  After hours call [phone]

## 2021-01-23 ENCOUNTER — Encounter (HOSPITAL_COMMUNITY)
Admission: AD | Admit: 2021-01-23 | Discharge: 2021-01-23 | Disposition: A | Discharge status: Home / Self Care | Payer: Medicare PPO | Source: Skilled Nursing Facility | Attending: Adult Health | Admitting: Adult Health

## 2021-01-23 DIAGNOSIS — E785 Hyperlipidemia, unspecified: Secondary | ICD-10-CM | POA: Insufficient documentation

## 2021-01-23 DIAGNOSIS — I878 Other specified disorders of veins: Secondary | ICD-10-CM | POA: Diagnosis present

## 2021-01-23 LAB — CBC
HCT: 36.8 % (ref 36.0–46.0)
Hemoglobin: 11.5 g/dL — ABNORMAL LOW (ref 12.0–15.0)
MCH: 29.5 pg (ref 26.0–34.0)
MCHC: 31.3 g/dL (ref 30.0–36.0)
MCV: 94.4 fL (ref 80.0–100.0)
Platelets: 162 10*3/uL (ref 150–400)
RBC: 3.9 MIL/uL (ref 3.87–5.11)
RDW: 14.3 % (ref 11.5–15.5)
WBC: 4.9 10*3/uL (ref 4.0–10.5)
nRBC: 0 % (ref 0.0–0.2)

## 2021-01-23 LAB — LIPID PANEL
Cholesterol: 130 mg/dL (ref 0–200)
HDL: 43 mg/dL (ref >40)
LDL Cholesterol: 78 mg/dL (ref 0–99)
Total CHOL/HDL Ratio: 3 RATIO
Triglycerides: 44 mg/dL (ref <150)
VLDL: 9 mg/dL (ref 0–40)

## 2021-01-23 LAB — COMPREHENSIVE METABOLIC PANEL
ALT: 17 U/L (ref 0–44)
AST: 19 U/L (ref 15–41)
Albumin: 3.4 g/dL — ABNORMAL LOW (ref 3.5–5.0)
Alkaline Phosphatase: 55 U/L (ref 38–126)
Anion gap: 8 (ref 5–15)
BUN: 19 mg/dL (ref 8–23)
CO2: 31 mmol/L (ref 22–32)
Calcium: 8.5 mg/dL — ABNORMAL LOW (ref 8.9–10.3)
Chloride: 102 mmol/L (ref 98–111)
Creatinine, Ser: 0.74 mg/dL (ref 0.44–1.00)
GFR, Estimated: >60 mL/min (ref >60)
Glucose, Bld: 89 mg/dL (ref 70–99)
Potassium: 3.6 mmol/L (ref 3.5–5.1)
Sodium: 141 mmol/L (ref 135–145)
Total Bilirubin: 0.4 mg/dL (ref 0.3–1.2)
Total Protein: 6.5 g/dL (ref 6.5–8.1)

## 2021-01-24 ENCOUNTER — Encounter: Payer: Self-pay | Admitting: Adult Health

## 2021-01-24 ENCOUNTER — Non-Acute Institutional Stay (SKILLED_NURSING_FACILITY): Payer: Medicare PPO | Admitting: Adult Health

## 2021-01-24 DIAGNOSIS — Z0000 Encounter for general adult medical examination without abnormal findings: Secondary | ICD-10-CM | POA: Diagnosis not present

## 2021-01-24 NOTE — Patient Instructions (Signed)
   Alejandra Cruz , Thank you for taking time to come for your Medicare Wellness Visit. I appreciate your ongoing commitment to your health goals. Please review the following plan we discussed and let me know if I can assist you in the future.   These are the goals we discussed: Goals    . DIET - INCREASE WATER INTAKE    . General - Client will not be readmitted within 30 days (C-SNP)    . General - Client will not be readmitted within 30 days (C-SNP)       This is a list of the screening recommended for you and due dates:  Health Maintenance  Topic Date Due  . Flu Shot  05/08/2021  . Tetanus Vaccine  06/21/2027  . DEXA scan (bone density measurement)  Completed  . COVID-19 Vaccine  Completed  . Pneumonia vaccines  Completed  . HPV Vaccine  Aged Out

## 2021-01-24 NOTE — Progress Notes (Signed)
Subjective:   Alejandra Cruz is a 87 y.o. female who presents for Medicare Annual (Subsequent) preventive examination.  Review of Systems    Review of Systems  Constitutional: Negative for malaise/fatigue.  Respiratory: Negative for cough and shortness of breath.   Cardiovascular: Negative for chest pain, palpitations and leg swelling.  Gastrointestinal: Negative for abdominal pain, constipation and heartburn.  Musculoskeletal: Negative for back pain, joint pain and myalgias.  Skin: Negative.   Neurological: Negative for dizziness.  Psychiatric/Behavioral: The patient is not nervous/anxious.     Cardiac Risk Factors include: advanced age (>89men, >35 women);obesity (BMI >30kg/m2);sedentary lifestyle     Objective:    Today's Vitals   01/24/21 1059 01/24/21 1103  BP: 130/72   Pulse: 70   Resp: 18   Temp: 98.4 F (36.9 C)   Weight: 259 lb (117.5 kg)   Height: 5\' 7"  (1.702 m)   PainSc:  0-No pain   Body mass index is 40.57 kg/m.  Advanced Directives 12/26/2020 11/29/2020 11/02/2020 09/29/2020 09/20/2020 08/31/2020 08/02/2020  Does Patient Have a Medical Advance Directive? No No No No No Yes Yes  Type of Advance Directive - - - - - - -  Does patient want to make changes to medical advance directive? No - Patient declined No - Patient declined No - Patient declined No - Patient declined No - Patient declined No - Patient declined No - Patient declined  Copy of Healthcare Power of Attorney in Chart? - - - - - - -  Would patient like information on creating a medical advance directive? - - - - - - -  Pre-existing out of facility DNR order (yellow form or pink MOST form) - - - - - - -    Current Medications (verified) Outpatient Encounter Medications as of 01/24/2021  Medication Sig  . acetaminophen (TYLENOL) 650 MG CR tablet Take 1,300 mg by mouth daily.   01/26/2021 aspirin 81 MG chewable tablet Chew 81 mg by mouth daily.  Marland Kitchen [place]Castor [employer] [facility]) OINT Special Instructions:  Apply to sacrum, coccyx and bilateral buttocks qshift for prevention.  . calcium carbonate (TUMS - DOSED IN MG ELEMENTAL CALCIUM) 500 MG chewable tablet Chew 1 tablet by mouth 3 (three) times daily. DX age related osteoporosis without current pathological fracture 07/10/2018 per Dr.Gupta note  . FLUoxetine (PROZAC) 20 MG tablet Take 10 mg by mouth daily.  . Foot Care Products (CORN CUSHIONS) PADS Apply 1 Device topically every other day. Apply pad to 2nd toe of R foot every other day until healed  . hydrochlorothiazide (HYDRODIURIL) 12.5 MG tablet Take 12.5 mg by mouth daily.  09/09/2018 levETIRAcetam (KEPPRA) 500 MG tablet TAKE 1 TABLET BY MOUTH TWICE A DAY AS DIRECTED.  Marland Kitchen lovastatin (MEVACOR) 20 MG tablet Take 1 tablet (20 mg total) by mouth at bedtime.  . Melatonin 5 MG TABS Take 5 mg by mouth at bedtime.  . memantine (NAMENDA) 5 MG tablet Take 5 mg by mouth 2 (two) times daily.  . NON FORMULARY Diet type:  NAS  . potassium chloride (K-DUR,KLOR-CON) 10 MEQ tablet Take 10 mEq by mouth daily.  . Salicylic Acid 40 % PADS Apply topically every other day. Special Instructions: to 2nd digit right foot  . senna (SENOKOT) 8.6 MG TABS tablet Take 2 tablets by mouth daily. For Constipation  . sennosides-docusate sodium (SENOKOT-S) 8.6-50 MG tablet Take 1 tablet by mouth at bedtime. For Constipation  . SYMBICORT 80-4.5 MCG/ACT inhaler Inhale 2 puffs into the lungs  2 (two) times daily.    No facility-administered encounter medications on file as of 01/24/2021.    Allergies (verified) Patient has no known allergies.   History: Past Medical History:  Diagnosis Date  . Diverticulosis OCT 2010 TCS   SIGMOID COLON  . Esophageal web 2010   DILATION 16 MM  . Gastritis, Helicobacter pylori 2010   Rx. ABO for 10 days   . GERD (gastroesophageal reflux disease)   . Hip fracture, left (HCC)    2016  . Hyperlipidemia   . Hypertension   . Restless legs syndrome   . Seizures [facility]) January 2010   Post head trauma  in fall .sees Dr Gerilyn Pilgrim   Past Surgical History:  Procedure Laterality Date  . ABDOMINAL HYSTERECTOMY    . COLONOSCOPY  OCT 2010   TORTUOUS, Sml IH, Tics, MULTIPLE SIMPLE ADENOMAS (<6MM)  . HIP ARTHROPLASTY Left 11/19/2014   Procedure: LEFT PARTIAL HIP REPLACEMENT;  Surgeon: Vickki Hearing, MD;  Location: [facility];  Service: Orthopedics;  Laterality: Left;  . ORIF HIP FRACTURE  04/05/2012   Procedure: OPEN REDUCTION INTERNAL FIXATION HIP;  Surgeon: Vickki Hearing, MD;  Location: [facility];  Service: Orthopedics;  Laterality: Right;  . UPPER GASTROINTESTINAL ENDOSCOPY  AUG 2010   SAVARY  . VESICOVAGINAL FISTULA CLOSURE W/ TAH  1966   bleeding    Family History  Problem Relation Age of Onset  . Cancer Mother        bladder   . Diabetes Mother   . Diabetes Father   . Emphysema Father   . GER disease Brother   . Colon cancer Neg Hx   . Colon polyps Neg Hx    Social History   Socioeconomic History  . Marital status: Widowed    Spouse name: Not on file  . Number of children: Not on file  . Years of education: Not on file  . Highest education level: Not on file  Occupational History  . Occupation: custodian / Health and safety inspector -[employer]   . Occupation: retired   Tobacco Use  . Smoking status: Never Smoker  . Smokeless tobacco: Never Used  Vaping Use  . Vaping Use: Never used  Substance and Sexual Activity  . Alcohol use: No  . Drug use: No  . Sexual activity: Not Currently  Other Topics Concern  . Not on file  Social History Narrative   Long term resident of [facility]    Social Determinants of Health   Financial Resource Strain: Not on file  Food Insecurity: Not on file  Transportation Needs: Not on file  Physical Activity: Not on file  Stress: Not on file  Social Connections: Not on file    Tobacco Counseling Counseling given: Not Answered   Clinical Intake:  Pre-visit preparation completed: Yes  Pain : No/denies pain Pain Score: 0-No pain      BMI - recorded: 40.57 Nutritional Status: BMI > 30  Obese Nutritional Risks: Unintentional weight gain Diabetes: No  How often do you need to have someone help you when you read instructions, pamphlets, or other written materials from your doctor or pharmacy?: 5 - Always  Diabetic?no  Interpreter Needed?: No      Activities of Daily Living In your present state of health, do you have any difficulty performing the following activities: 01/24/2021  Hearing? N  Vision? N  Difficulty concentrating or making decisions? Y  Walking or climbing stairs? Y  Dressing or bathing? Y  Doing errands, shopping? Alejandra Cruz  Preparing Food and eating ? Y  Using the Toilet? Y  In the past six months, have you accidently leaked urine? Y  Do you have problems with loss of bowel control? Y  Managing your Medications? Y  Managing your Finances? Y  Housekeeping or managing your Housekeeping? Y  Some recent data might be hidden    Patient Care Team: Sharee Holster, NP as PCP - General (Geriatric Medicine) [place] Bali, MD (Inactive) (Gastroenterology) [facility] Lanelle Bal, DO as Consulting Physician (Internal Medicine)  Indicate any recent Medical Services you may have received from other than [facility] providers in the past year (date may be approximate).     Assessment:   This is a routine wellness examination for Palma.  Hearing/Vision screen No exam data present  Dietary issues and exercise activities discussed: Current Exercise Habits: The patient does not participate in regular exercise at present, Intensity: Not Applicable, Exercise limited by: orthopedic condition(s)  Goals    . DIET - INCREASE WATER INTAKE    . General - Client will not be readmitted within 30 days (C-SNP)    . General - Client will not be readmitted within 30 days (C-SNP)      Depression Screen PHQ 2/9 Scores 01/24/2021 01/22/2020 07/14/2018 07/04/2017 04/29/2014  PHQ - 2  Score 0 0 1 4 2   PHQ- 9 Score 2 0 - 11 12    Fall Risk Fall Risk  01/24/2021 01/22/2020 07/14/2018 07/04/2017 04/29/2014  Falls in the past year? 1 0 No No Yes  Number falls in past yr: 0 0 - - 1  Injury with Fall? 0 - - - No  Risk for fall due to : History of fall(s);Impaired balance/gait;Impaired mobility - - - -  Follow up Falls evaluation completed - - - -    FALL RISK PREVENTION PERTAINING TO THE HOME:  Any stairs in or around the home? no If so, are there any without handrails? n/a Home free of loose throw rugs in walkways, pet beds, electrical cords, etc? yes  Adequate lighting in your home to reduce risk of falls? Yes  ASSISTIVE DEVICES UTILIZED TO PREVENT FALLS:  Life alert? n/a Use of a cane, walker or w/c? Wheelchair  Grab bars in the bathroom? Yes  Shower chair or bench in shower? Yes  Elevated toilet seat or a handicapped toilet?yes   TIMED UP AND GO:  Was the test performed? no.  Wheelchair bound   Cognitive Function: MMSE - Mini Mental State Exam 04/29/2014  Orientation to time 5  Orientation to Place 5  Registration 3  Attention/ Calculation 5  Recall 2  Language- name 2 objects 2  Language- repeat 1  Language- follow 3 step command 3  Language- read & follow direction 1  Write a sentence 1  Copy design 0  Total score 28     6CIT Screen 01/22/2020 07/14/2018 07/04/2017  What Year? 0 points 0 points 0 points  What month? 0 points 0 points 0 points  What time? 0 points 0 points 0 points  Count back from 20 2 points 0 points 0 points  Months in reverse 2 points 0 points 0 points  Repeat phrase 2 points 4 points 4 points  Total Score 6 4 4     Immunizations Immunization History  Administered Date(s) Administered  . Influenza Split 07/25/2012  . Influenza,inj,Quad PF,6+ Mos 07/30/2013, 08/18/2014  . Influenza-Unspecified 07/12/2017, 07/11/2018, 07/13/2019, 07/15/2020  . Moderna Sars-Covid-2 Vaccination 10/14/2019, 11/11/2019,  08/11/2020  . PPD Test  12/06/2014  . Pneumococcal Conjugate-13 04/29/2014  . Pneumococcal Polysaccharide-23 07/25/2012  . Pneumococcal-Unspecified 08/01/2016  . Tdap 08/08/2011, 06/20/2017   Vaccines per facility   Screening Tests Health Maintenance  Topic Date Due  . INFLUENZA VACCINE  05/08/2021  . TETANUS/TDAP  06/21/2027  . DEXA SCAN  Completed  . COVID-19 Vaccine  Completed  . PNA vac Low Risk Adult  Completed  . HPV VACCINES  Aged Out    Health Maintenance  There are no preventive care reminders to display for this patient.    Lung Cancer Screening: (Low Dose CT Chest recommended if Age 66-80 years, 30 pack-year currently smoking OR have quit w/in 15years.) does not qualify.   Lung Cancer Screening Referral: no  Additional Screening:  Hepatitis C Screening: does not  qualify;   Vision Screening: Recommended annual ophthalmology exams for early detection of glaucoma and other disorders of the eye. Is the patient up to date with their annual eye exam? yes  Who is the provider or what is the name of the office in which the patient attends annual eye exams? Per facility  If pt is not established with a provider, would they like to be referred to a provider to establish care? N/a   Dental Screening: Recommended annual dental exams for proper oral hygiene  Community Resource Referral / Chronic Care Management: CRR required this visit? no  CCM required this visit? No      Plan:     I have personally reviewed and noted the following in the patient's chart:   . Medical and social history . Use of alcohol, tobacco or illicit drugs  . Current medications and supplements . Functional ability and status . Nutritional status . Physical activity . Advanced directives . List of other physicians . Hospitalizations, surgeries, and ER visits in previous 12 months . Vitals . Screenings to include cognitive, depression, and falls . Referrals and appointments  In addition, I have reviewed  and discussed with patient certain preventive protocols, quality metrics, and best practice recommendations. A written personalized care plan for preventive services as well as general preventive health recommendations were provided to patient.     Sharee Holstereborah S Stefan Karen, NP   01/24/2021

## 2021-02-03 ENCOUNTER — Non-Acute Institutional Stay (SKILLED_NURSING_FACILITY): Payer: Medicare PPO | Admitting: Adult Health

## 2021-02-03 ENCOUNTER — Encounter: Payer: Self-pay | Admitting: Adult Health

## 2021-02-03 DIAGNOSIS — G40909 Epilepsy, unspecified, not intractable, without status epilepticus: Secondary | ICD-10-CM

## 2021-02-03 DIAGNOSIS — F429 Obsessive-compulsive disorder, unspecified: Secondary | ICD-10-CM

## 2021-02-03 DIAGNOSIS — R52 Pain, unspecified: Secondary | ICD-10-CM

## 2021-02-03 DIAGNOSIS — G8929 Other chronic pain: Secondary | ICD-10-CM

## 2021-02-03 NOTE — Progress Notes (Signed)
Location:  [facility] Room Number: 116/D Place of Service:  SNF (31)   CODE STATUS: Full Code  No Known Allergies  Chief Complaint  Patient presents with  . Medical Management of Chronic Issues          Morbid obesity:    Unspecified obsessive compulsive disorder:   Chronic generalized pain   Seizure disorder     HPI:  She is a 87 year old long term resident of this facility being seen for the management of her chronic illnesses; Morbid obesity: BMI 40.57   Unspecified obsessive compulsive disorder:   Chronic generalized pain   Seizure disorder. There are no reports of uncontrolled pain; no changes in appetite; no anxiety.   Past Medical History:  Diagnosis Date  . Diverticulosis OCT 2010 TCS   SIGMOID COLON  . Esophageal web 2010   DILATION 16 MM  . Gastritis, Helicobacter pylori 2010   Rx. ABO for 10 days   . GERD (gastroesophageal reflux disease)   . Hip fracture, left (HCC)    2016  . Hyperlipidemia   . Hypertension   . Restless legs syndrome   . Seizures [facility]) January 2010   Post head trauma in fall .sees Dr Gerilyn Pilgrim    Past Surgical History:  Procedure Laterality Date  . ABDOMINAL HYSTERECTOMY    . COLONOSCOPY  OCT 2010   TORTUOUS, Sml IH, Tics, MULTIPLE SIMPLE ADENOMAS (<6MM)  . HIP ARTHROPLASTY Left 11/19/2014   Procedure: LEFT PARTIAL HIP REPLACEMENT;  Surgeon: Vickki Hearing, MD;  Location: [facility];  Service: Orthopedics;  Laterality: Left;  . ORIF HIP FRACTURE  04/05/2012   Procedure: OPEN REDUCTION INTERNAL FIXATION HIP;  Surgeon: Vickki Hearing, MD;  Location: [facility];  Service: Orthopedics;  Laterality: Right;  . UPPER GASTROINTESTINAL ENDOSCOPY  AUG 2010   SAVARY  . VESICOVAGINAL FISTULA CLOSURE W/ TAH  1966   bleeding     Social History   Socioeconomic History  . Marital status: Widowed    Spouse name: Not on file  . Number of children: Not on file  . Years of education: Not on file  . Highest education level: Not  on file  Occupational History  . Occupation: custodian / Health and safety inspector -[employer]   . Occupation: retired   Tobacco Use  . Smoking status: Never Smoker  . Smokeless tobacco: Never Used  Vaping Use  . Vaping Use: Never used  Substance and Sexual Activity  . Alcohol use: No  . Drug use: No  . Sexual activity: Not Currently  Other Topics Concern  . Not on file  Social History Narrative   Long term resident of [facility]    Social Determinants of Health   Financial Resource Strain: Not on file  Food Insecurity: Not on file  Transportation Needs: Not on file  Physical Activity: Not on file  Stress: Not on file  Social Connections: Not on file  Intimate Partner Violence: Not on file   Family History  Problem Relation Age of Onset  . Cancer Mother        bladder   . Diabetes Mother   . Diabetes Father   . Emphysema Father   . GER disease Brother   . Colon cancer Neg Hx   . Colon polyps Neg Hx       VITAL SIGNS BP 113/71   Pulse 78   Temp 98.2 F (36.8 C)   Ht 5\' 7"  (1.702 m)   Wt 259  lb (117.5 kg)   BMI 40.57 kg/m   Outpatient Encounter Medications as of 02/03/2021  Medication Sig  . acetaminophen (TYLENOL) 650 MG CR tablet Take 650 mg by mouth 2 (two) times daily.  Marland Kitchen aspirin 81 MG chewable tablet Chew 81 mg by mouth daily.  Lucilla Lame [place] [facility]) OINT Special Instructions: Apply to sacrum, coccyx and bilateral buttocks qshift for prevention.  . calcium carbonate (TUMS - DOSED IN MG ELEMENTAL CALCIUM) 500 MG chewable tablet Chew 1 tablet by mouth 3 (three) times daily. DX age related osteoporosis without current pathological fracture 07/10/2018 per Dr.Gupta note  . FLUoxetine (PROZAC) 20 MG tablet Take 20 mg by mouth daily.  . Foot Care Products (CORN CUSHIONS) PADS Apply 1 Device topically every other day. Apply pad to 2nd toe of R foot every other day until healed  . hydrochlorothiazide (HYDRODIURIL) 12.5 MG tablet Take 12.5 mg by mouth daily.  Marland Kitchen  levETIRAcetam (KEPPRA) 500 MG tablet TAKE 1 TABLET BY MOUTH TWICE A DAY AS DIRECTED.  Marland Kitchen lovastatin (MEVACOR) 20 MG tablet Take 1 tablet (20 mg total) by mouth at bedtime.  . Melatonin 5 MG TABS Take 5 mg by mouth at bedtime.  . memantine (NAMENDA) 5 MG tablet Take 5 mg by mouth 2 (two) times daily.  . NON FORMULARY Diet type:  NAS  . potassium chloride (K-DUR,KLOR-CON) 10 MEQ tablet Take 10 mEq by mouth daily.  . Salicylic Acid 40 % PADS Apply topically every other day. Special Instructions: to 2nd digit right foot  . sennosides-docusate sodium (SENOKOT-S) 8.6-50 MG tablet Take 1 tablet by mouth at bedtime. For Constipation  . SYMBICORT 80-4.5 MCG/ACT inhaler Inhale 2 puffs into the lungs 2 (two) times daily.   . [DISCONTINUED] senna (SENOKOT) 8.6 MG TABS tablet Take 2 tablets by mouth daily. For Constipation   No facility-administered encounter medications on file as of 02/03/2021.     SIGNIFICANT DIAGNOSTIC EXAMS  PREVIOUS  09-07-19: DEXA: t score -1.06  NO NEW EXAMS.   LABS REVIEWED PREVIOUS  07-25-20: wbc 4.9; hgb 12.1; hct 38.5; mcv 95.1 plt 142; glucose 85; bun 20; creat 0.70; k+ 3.9; na++ 142; ca 8.4 liver normal albumin 3.5   TODAY  01-23-21 wbc 4.9; hgb 11.5;hct 36.8; mcv 94.4 plt 162; glucose 89; bun 19; creat 0.74; k+ 3.6; na++ 141; ca 8.5 GFR>60 liver normal albumin 3.4; chol 130; ldl 89; trig 61; hdl 42      Review of Systems  Constitutional: Negative for malaise/fatigue.  Respiratory: Negative for cough and shortness of breath.   Cardiovascular: Negative for chest pain, palpitations and leg swelling.  Gastrointestinal: Negative for abdominal pain, constipation and heartburn.  Musculoskeletal: Negative for back pain, joint pain and myalgias.  Skin: Negative.   Neurological: Negative for dizziness.  Psychiatric/Behavioral: The patient is not nervous/anxious.       Physical Exam Constitutional:      General: She is not in acute distress.    Appearance: She is  well-developed. She is obese. She is not diaphoretic.  Neck:     Thyroid: No thyromegaly.  Cardiovascular:     Rate and Rhythm: Normal rate and regular rhythm.     Pulses: Normal pulses.     Heart sounds: Normal heart sounds.  Pulmonary:     Effort: Pulmonary effort is normal. No respiratory distress.     Breath sounds: Normal breath sounds.  Abdominal:     General: Bowel sounds are normal. There is no distension.  Palpations: Abdomen is soft.     Tenderness: There is no abdominal tenderness.  Musculoskeletal:     Cervical back: Neck supple.     Right lower leg: No edema.     Left lower leg: No edema.     Comments:  Is able to move all extremities  2016: left hip fracture left hip replacement    Lymphadenopathy:     Cervical: No cervical adenopathy.  Skin:    General: Skin is warm and dry.  Neurological:     Mental Status: She is alert. Mental status is at baseline.  Psychiatric:        Mood and Affect: Mood normal.        ASSESSMENT/ PLAN:  TODAY:   1. Morbid obesity: BMI 40.57 will continue to monitor her status.   2. Unspecified obsessive compulsive disorder: is stable will continue prozac 20 mg daily (has failed wean X2); melatonin 5 mg nightly   3. Chronic generalized pain will continue tylenol 1300 mg daily   4. Seizure disorder (after CHI); no reports of seizure activity will continue keppra 500 mg twice daily    PREVIOUS   5. Thrombocytopenia: is stable her platelets are 142; will continue to monitor her status.   6. Essential hypertension: b/o 156/84 she is presently stable will continue hctz 12.5 mg daily   7. Late onset alzheimer's disease without behavioral disturbance: is without change weight is 259 pounds. She continues to come out of room daily; will continue namenda 5 mg twice daily   8. Simple chronic bronchitis: is stable without recent flares will continue symbicort 80/4.5 mcg 2 puffs twice daily   9. Chronic constipation: is stable will  continue colace 200 mg daily; senna 2 tabs daily and 1 tab nightly   10. Hypokalemia: is stable k+ 3.6 will continue  10 meq daily   11. Unspecified hyperlipidemia: is stable LDL 89 will continue mevacor 20 mg daily   12. Anemia: will hemoccult stool X 3. Hgb 11.5       Synthia Innocent NP [facility]  Contact [phone] Monday through Friday 8am- 5pm  After hours call [phone]

## 2021-02-15 ENCOUNTER — Other Ambulatory Visit (HOSPITAL_COMMUNITY)
Admission: RE | Admit: 2021-02-15 | Discharge: 2021-02-15 | Disposition: A | Discharge status: Home / Self Care | Payer: Medicare PPO | Source: Skilled Nursing Facility | Attending: Adult Health | Admitting: Adult Health

## 2021-02-15 DIAGNOSIS — D649 Anemia, unspecified: Secondary | ICD-10-CM | POA: Diagnosis present

## 2021-02-15 LAB — OCCULT BLOOD X 1 CARD TO LAB, STOOL: Fecal Occult Bld: NEGATIVE

## 2021-02-17 ENCOUNTER — Other Ambulatory Visit (HOSPITAL_COMMUNITY)
Admission: RE | Admit: 2021-02-17 | Discharge: 2021-02-17 | Disposition: A | Discharge status: Home / Self Care | Payer: Medicare PPO | Source: Skilled Nursing Facility | Attending: Adult Health | Admitting: Adult Health

## 2021-02-17 DIAGNOSIS — D649 Anemia, unspecified: Secondary | ICD-10-CM | POA: Insufficient documentation

## 2021-02-17 LAB — OCCULT BLOOD X 1 CARD TO LAB, STOOL: Fecal Occult Bld: NEGATIVE

## 2021-02-19 ENCOUNTER — Other Ambulatory Visit (HOSPITAL_COMMUNITY)
Admission: RE | Admit: 2021-02-19 | Discharge: 2021-02-19 | Disposition: A | Discharge status: Home / Self Care | Payer: Medicare PPO | Source: Skilled Nursing Facility | Attending: Adult Health | Admitting: Adult Health

## 2021-02-19 DIAGNOSIS — D649 Anemia, unspecified: Secondary | ICD-10-CM | POA: Insufficient documentation

## 2021-02-19 LAB — OCCULT BLOOD X 1 CARD TO LAB, STOOL: Fecal Occult Bld: NEGATIVE

## 2021-02-28 ENCOUNTER — Non-Acute Institutional Stay (SKILLED_NURSING_FACILITY): Payer: Medicare PPO | Admitting: Adult Health

## 2021-02-28 ENCOUNTER — Encounter: Payer: Self-pay | Admitting: Adult Health

## 2021-02-28 DIAGNOSIS — I10 Essential (primary) hypertension: Secondary | ICD-10-CM

## 2021-02-28 DIAGNOSIS — D696 Thrombocytopenia, unspecified: Secondary | ICD-10-CM | POA: Diagnosis not present

## 2021-02-28 DIAGNOSIS — F0281 Dementia in other diseases classified elsewhere with behavioral disturbance: Secondary | ICD-10-CM

## 2021-02-28 DIAGNOSIS — J410 Simple chronic bronchitis: Secondary | ICD-10-CM

## 2021-02-28 DIAGNOSIS — F02818 Dementia in other diseases classified elsewhere, unspecified severity, with other behavioral disturbance: Secondary | ICD-10-CM

## 2021-02-28 DIAGNOSIS — G301 Alzheimer's disease with late onset: Secondary | ICD-10-CM

## 2021-02-28 NOTE — Progress Notes (Signed)
Location:  [facility] Room Number: 222 Place of Service:  SNF (31)   CODE STATUS: full code  No Known Allergies  Chief Complaint  Patient presents with  . Medical Management of Chronic Issues            Thrombocytopenia:   Essential hypertension:   Late onset alzheimer's disease without behavioral disturbance: Simple chronic bronchitis:    HPI:  She is a 87 year old long term resident of this facility being seen for the management of her chronic illnesses:  Thrombocytopenia:   Essential hypertension:   Late onset alzheimer's disease without behavioral disturbance: Simple chronic bronchitis:. There are no reports of cough or shortness of breath. No reports of changes in appetite; no reports of uncontrolled pain.   Past Medical History:  Diagnosis Date  . Diverticulosis OCT 2010 TCS   SIGMOID COLON  . Esophageal web 2010   DILATION 16 MM  . Gastritis, Helicobacter pylori 2010   Rx. ABO for 10 days   . GERD (gastroesophageal reflux disease)   . Hip fracture, left (HCC)    2016  . Hyperlipidemia   . Hypertension   . Restless legs syndrome   . Seizures [facility]) January 2010   Post head trauma in fall .sees Dr Gerilyn Pilgrim    Past Surgical History:  Procedure Laterality Date  . ABDOMINAL HYSTERECTOMY    . COLONOSCOPY  OCT 2010   TORTUOUS, Sml IH, Tics, MULTIPLE SIMPLE ADENOMAS (<6MM)  . HIP ARTHROPLASTY Left 11/19/2014   Procedure: LEFT PARTIAL HIP REPLACEMENT;  Surgeon: Vickki Hearing, MD;  Location: [facility];  Service: Orthopedics;  Laterality: Left;  . ORIF HIP FRACTURE  04/05/2012   Procedure: OPEN REDUCTION INTERNAL FIXATION HIP;  Surgeon: Vickki Hearing, MD;  Location: [facility];  Service: Orthopedics;  Laterality: Right;  . UPPER GASTROINTESTINAL ENDOSCOPY  AUG 2010   SAVARY  . VESICOVAGINAL FISTULA CLOSURE W/ TAH  1966   bleeding     Social History   Socioeconomic History  . Marital status: Widowed    Spouse name: Not on file  . Number of  children: Not on file  . Years of education: Not on file  . Highest education level: Not on file  Occupational History  . Occupation: custodian / Health and safety inspector -[employer]   . Occupation: retired   Tobacco Use  . Smoking status: Never Smoker  . Smokeless tobacco: Never Used  Vaping Use  . Vaping Use: Never used  Substance and Sexual Activity  . Alcohol use: No  . Drug use: No  . Sexual activity: Not Currently  Other Topics Concern  . Not on file  Social History Narrative   Long term resident of [facility]    Social Determinants of Health   Financial Resource Strain: Not on file  Food Insecurity: Not on file  Transportation Needs: Not on file  Physical Activity: Not on file  Stress: Not on file  Social Connections: Not on file  Intimate Partner Violence: Not on file   Family History  Problem Relation Age of Onset  . Cancer Mother        bladder   . Diabetes Mother   . Diabetes Father   . Emphysema Father   . GER disease Brother   . Colon cancer Neg Hx   . Colon polyps Neg Hx       VITAL SIGNS BP 135/78   Pulse 78   Temp 98.3 F (36.8 C)   Resp  18   Ht 5\' 7"  (1.702 m)   Wt 257 lb (116.6 kg)   BMI 40.25 kg/m   Outpatient Encounter Medications as of 02/28/2021  Medication Sig  . acetaminophen (TYLENOL) 650 MG CR tablet Take 650 mg by mouth 2 (two) times daily.  03/02/2021 aspirin 81 MG chewable tablet Chew 81 mg by mouth daily.  Marland Kitchen [place] [facility]) OINT Special Instructions: Apply to sacrum, coccyx and bilateral buttocks qshift for prevention.  . calcium carbonate (TUMS - DOSED IN MG ELEMENTAL CALCIUM) 500 MG chewable tablet Chew 1 tablet by mouth 3 (three) times daily. DX age related osteoporosis without current pathological fracture 07/10/2018 per Dr.Gupta note  . FLUoxetine (PROZAC) 20 MG tablet Take 20 mg by mouth daily.  . Foot Care Products (CORN CUSHIONS) PADS Apply 1 Device topically every other day. Apply pad to 2nd toe of R foot every other day  until healed  . hydrochlorothiazide (HYDRODIURIL) 12.5 MG tablet Take 12.5 mg by mouth daily.  09/09/2018 levETIRAcetam (KEPPRA) 500 MG tablet TAKE 1 TABLET BY MOUTH TWICE A DAY AS DIRECTED.  Marland Kitchen lovastatin (MEVACOR) 20 MG tablet Take 1 tablet (20 mg total) by mouth at bedtime.  . Melatonin 5 MG TABS Take 5 mg by mouth at bedtime.  . memantine (NAMENDA) 5 MG tablet Take 5 mg by mouth 2 (two) times daily.  . NON FORMULARY Diet type:  NAS  . potassium chloride (K-DUR,KLOR-CON) 10 MEQ tablet Take 10 mEq by mouth daily.  . Salicylic Acid 40 % PADS Apply topically every other day. Special Instructions: to 2nd digit right foot  . sennosides-docusate sodium (SENOKOT-S) 8.6-50 MG tablet Take 1 tablet by mouth at bedtime. For Constipation  . SYMBICORT 80-4.5 MCG/ACT inhaler Inhale 2 puffs into the lungs 2 (two) times daily.    No facility-administered encounter medications on file as of 02/28/2021.     SIGNIFICANT DIAGNOSTIC EXAMS   PREVIOUS  09-07-19: DEXA: t score -1.06  NO NEW EXAMS.   LABS REVIEWED PREVIOUS  07-25-20: wbc 4.9; hgb 12.1; hct 38.5; mcv 95.1 plt 142; glucose 85; bun 20; creat 0.70; k+ 3.9; na++ 142; ca 8.4 liver normal albumin 3.5  01-23-21 wbc 4.9; hgb 11.5;hct 36.8; mcv 94.4 plt 162; glucose 89; bun 19; creat 0.74; k+ 3.6; na++ 141; ca 8.5 GFR>60 liver normal albumin 3.4; chol 130; ldl 89; trig 61; hdl 42  5-11,13,15-22: guaiac negative   NO NEW LABS.      Review of Systems  Constitutional: Negative for malaise/fatigue.  Respiratory: Negative for cough and shortness of breath.   Cardiovascular: Negative for chest pain, palpitations and leg swelling.  Gastrointestinal: Negative for abdominal pain, constipation and heartburn.  Musculoskeletal: Negative for back pain, joint pain and myalgias.  Skin: Negative.   Neurological: Negative for dizziness.  Psychiatric/Behavioral: The patient is not nervous/anxious.       Physical Exam Constitutional:      General: She is not in  acute distress.    Appearance: She is well-developed. She is obese. She is not diaphoretic.  Neck:     Thyroid: No thyromegaly.  Cardiovascular:     Rate and Rhythm: Normal rate and regular rhythm.     Pulses: Normal pulses.     Heart sounds: Normal heart sounds.  Pulmonary:     Effort: Pulmonary effort is normal. No respiratory distress.     Breath sounds: Normal breath sounds.  Abdominal:     General: Bowel sounds are normal. There is no distension.  Palpations: Abdomen is soft.     Tenderness: There is no abdominal tenderness.  Musculoskeletal:     Cervical back: Neck supple.     Right lower leg: No edema.     Left lower leg: No edema.     Comments: Is able to move all extremities  2016: left hip fracture left hip replacement   Lymphadenopathy:     Cervical: No cervical adenopathy.  Skin:    General: Skin is warm and dry.  Neurological:     Mental Status: She is alert. Mental status is at baseline.  Psychiatric:        Mood and Affect: Mood normal.       ASSESSMENT/ PLAN:  TODAY:   1. Thrombocytopenia: stable plt 142 will monitor  2. Essential hypertension: is stable b/p 135/78 will continue hctz 12.5 mg daily   3. Late onset alzheimer's disease without behavioral disturbance: is stable weight is 257 pounds; will continue namenda 5 mg twice daily   4. Simple chronic bronchitis: is stable without recent flares will continue symbicort 80/4.5 mcg 2 puffs twice daily   PREVIOUS   5. Chronic constipation: is stable will continue colace 200 mg daily; senna 2 tabs daily and 1 tab nightly   6. Hypokalemia: is stable k+ 3.6 will continue  10 meq daily   7. Unspecified hyperlipidemia: is stable LDL 89 will continue mevacor 20 mg daily   8. Anemia: guaiac negative. Hgb 11.5  Will check iron studies   9. Morbid obesity: BMI 40.25 will continue to monitor her status.   10. Unspecified obsessive compulsive disorder: is stable will continue prozac 20 mg daily (has  failed wean X2); melatonin 5 mg nightly   11. Chronic generalized pain will continue tylenol 1300 mg daily   14. Seizure disorder (after CHI); no reports of seizure activity will continue keppra 500 mg twice daily        Synthia Innocent NP [facility]  Contact [phone] Monday through Friday 8am- 5pm  After hours call [phone]

## 2021-03-10 ENCOUNTER — Encounter: Payer: Self-pay | Admitting: Internal Medicine

## 2021-03-10 DIAGNOSIS — G20 Parkinson's disease: Secondary | ICD-10-CM | POA: Insufficient documentation

## 2021-03-10 DIAGNOSIS — G20A1 Parkinson's disease without dyskinesia, without mention of fluctuations: Secondary | ICD-10-CM | POA: Insufficient documentation

## 2021-04-05 ENCOUNTER — Non-Acute Institutional Stay (SKILLED_NURSING_FACILITY): Payer: Medicare PPO | Admitting: Adult Health

## 2021-04-05 ENCOUNTER — Encounter: Payer: Self-pay | Admitting: Adult Health

## 2021-04-05 DIAGNOSIS — E876 Hypokalemia: Secondary | ICD-10-CM

## 2021-04-05 DIAGNOSIS — K5909 Other constipation: Secondary | ICD-10-CM | POA: Diagnosis not present

## 2021-04-05 DIAGNOSIS — E785 Hyperlipidemia, unspecified: Secondary | ICD-10-CM | POA: Diagnosis not present

## 2021-04-05 NOTE — Progress Notes (Signed)
Location:  [facility] Room Number: 222 Place of Service:  SNF (31)   CODE STATUS: full code   No Known Allergies  Chief Complaint  Patient presents with   Medical Management of Chronic Issues       Chronic constipation. Hypokalemia: Unspecified hyperlipidemia    HPI:  She is a 87 year old long term resident of this facility being seen for the management of her chronic illnesses: Chronic constipation. Hypokalemia: Unspecified hyperlipidemia. There are no reports of uncontrolled pain. There are no reports of agitation or anxiety; no reports of changes in appetite.   Past Medical History:  Diagnosis Date   Diverticulosis OCT 2010 TCS   SIGMOID COLON   Esophageal web 2010   DILATION 16 MM   Gastritis, Helicobacter pylori 2010   Rx. ABO for 10 days    GERD (gastroesophageal reflux disease)    Hip fracture, left (HCC)    2016   Hyperlipidemia    Hypertension    Restless legs syndrome    Seizures (HCC) January 2010   Post head trauma in fall .sees Dr Gerilyn Pilgrim    Past Surgical History:  Procedure Laterality Date   ABDOMINAL HYSTERECTOMY     COLONOSCOPY  OCT 2010   TORTUOUS, Sml IH, Tics, MULTIPLE SIMPLE ADENOMAS (<6MM)   HIP ARTHROPLASTY Left 11/19/2014   Procedure: LEFT PARTIAL HIP REPLACEMENT;  Surgeon: Vickki Hearing, MD;  Location: [facility];  Service: Orthopedics;  Laterality: Left;   ORIF HIP FRACTURE  04/05/2012   Procedure: OPEN REDUCTION INTERNAL FIXATION HIP;  Surgeon: Vickki Hearing, MD;  Location: [facility];  Service: Orthopedics;  Laterality: Right;   UPPER GASTROINTESTINAL ENDOSCOPY  AUG 2010   SAVARY   VESICOVAGINAL FISTULA CLOSURE W/ TAH  1966   bleeding     Social History   Socioeconomic History   Marital status: Widowed    Spouse name: Not on file   Number of children: Not on file   Years of education: Not on file   Highest education level: Not on file  Occupational History   Occupation: custodian / nutritionist  -Estate agent    Occupation: retired   Tobacco Use   Smoking status: Never   Smokeless tobacco: Never  Building services engineer Use: Never used  Substance and Sexual Activity   Alcohol use: No   Drug use: No   Sexual activity: Not Currently  Other Topics Concern   Not on file  Social History Narrative   Long term resident of [facility]    Social Determinants of Health   Financial Resource Strain: Not on file  Food Insecurity: Not on file  Transportation Needs: Not on file  Physical Activity: Not on file  Stress: Not on file  Social Connections: Not on file  Intimate Partner Violence: Not on file   Family History  Problem Relation Age of Onset   Cancer Mother        bladder    Diabetes Mother    Diabetes Father    Emphysema Father    GER disease Brother    Colon cancer Neg Hx    Colon polyps Neg Hx       VITAL SIGNS BP 118/68   Pulse 80   Temp 98.2 F (36.8 C)   Resp 18   Ht 5\' 7"  (1.702 m)   Wt 255 lb (115.7 kg)   BMI 39.94 kg/m   Outpatient Encounter Medications as of 04/05/2021  Medication Sig  acetaminophen (TYLENOL) 650 MG CR tablet Take 650 mg by mouth 2 (two) times daily.   aspirin 81 MG chewable tablet Chew 81 mg by mouth daily.   Balsam [place]-Castor Oil [facility]) OINT Special Instructions: Apply to sacrum, coccyx and bilateral buttocks qshift for prevention.   calcium carbonate (TUMS - DOSED IN MG ELEMENTAL CALCIUM) 500 MG chewable tablet Chew 1 tablet by mouth 3 (three) times daily. DX age related osteoporosis without current pathological fracture 07/10/2018 per Dr.Gupta note   FLUoxetine (PROZAC) 20 MG tablet Take 20 mg by mouth daily.   Foot Care Products (CORN CUSHIONS) PADS Apply 1 Device topically every other day. Apply pad to 2nd toe of R foot every other day until healed   hydrochlorothiazide (HYDRODIURIL) 12.5 MG tablet Take 12.5 mg by mouth daily.   levETIRAcetam (KEPPRA) 500 MG tablet TAKE 1 TABLET BY MOUTH TWICE A DAY AS DIRECTED.   lovastatin  (MEVACOR) 20 MG tablet Take 1 tablet (20 mg total) by mouth at bedtime.   Melatonin 5 MG TABS Take 5 mg by mouth at bedtime.   memantine (NAMENDA) 5 MG tablet Take 5 mg by mouth 2 (two) times daily.   NON FORMULARY Diet type:  NAS   potassium chloride (K-DUR,KLOR-CON) 10 MEQ tablet Take 10 mEq by mouth daily.   Salicylic Acid 40 % PADS Apply topically every other day. Special Instructions: to 2nd digit right foot   sennosides-docusate sodium (SENOKOT-S) 8.6-50 MG tablet Take 1 tablet by mouth at bedtime. For Constipation   SYMBICORT 80-4.5 MCG/ACT inhaler Inhale 2 puffs into the lungs 2 (two) times daily.    No facility-administered encounter medications on file as of 04/05/2021.     SIGNIFICANT DIAGNOSTIC EXAMS   PREVIOUS  09-07-19: DEXA: t score -1.06  NO NEW EXAMS.   LABS REVIEWED PREVIOUS  07-25-20: wbc 4.9; hgb 12.1; hct 38.5; mcv 95.1 plt 142; glucose 85; bun 20; creat 0.70; k+ 3.9; na++ 142; ca 8.4 liver normal albumin 3.5  01-23-21 wbc 4.9; hgb 11.5;hct 36.8; mcv 94.4 plt 162; glucose 89; bun 19; creat 0.74; k+ 3.6; na++ 141; ca 8.5 GFR>60 liver normal albumin 3.4; chol 130; ldl 89; trig 61; hdl 42  5-11,13,15-22: guaiac negative   NO NEW LABS.      Review of Systems  Constitutional:  Negative for malaise/fatigue.  Respiratory:  Negative for cough and shortness of breath.   Cardiovascular:  Negative for chest pain, palpitations and leg swelling.  Gastrointestinal:  Negative for abdominal pain, constipation and heartburn.  Musculoskeletal:  Negative for back pain, joint pain and myalgias.  Skin: Negative.   Neurological:  Negative for dizziness.  Psychiatric/Behavioral:  The patient is not nervous/anxious.       Physical Exam Constitutional:      General: She is not in acute distress.    Appearance: She is well-developed. She is obese. She is not diaphoretic.  Neck:     Thyroid: No thyromegaly.  Cardiovascular:     Rate and Rhythm: Normal rate and regular rhythm.      Heart sounds: Normal heart sounds.  Pulmonary:     Effort: Pulmonary effort is normal. No respiratory distress.     Breath sounds: Normal breath sounds.  Abdominal:     General: Bowel sounds are normal. There is no distension.     Palpations: Abdomen is soft.     Tenderness: There is no abdominal tenderness.  Musculoskeletal:     Cervical back: Neck supple.     Right lower  leg: No edema.     Left lower leg: No edema.     Comments:  Is able to move all extremities  2016: left hip fracture left hip replacement    Lymphadenopathy:     Cervical: No cervical adenopathy.  Skin:    General: Skin is warm and dry.  Neurological:     Mental Status: She is alert. Mental status is at baseline.  Psychiatric:        Mood and Affect: Mood normal.     ASSESSMENT/ PLAN:  TODAY:   Chronic constipation: is stable will continue colace 200 mg daily senna 2 tabs daily and 1 tab nightly   2. Hypokalemia: is stable k+ 3.6 will continue k+ 10 meq daily   3. Unspecified hyperlipidemia: is stable LDL 89 will continue mevacor 20 mg daily   PREVIOUS   4. Anemia: guaiac negative. Hgb 11.5  Will check iron studies   5. Morbid obesity: BMI 39.94 will continue to monitor her status.   7. Unspecified obsessive compulsive disorder: is stable will continue prozac 20 mg daily (has failed wean X2); melatonin 5 mg nightly   8. Chronic generalized pain will continue tylenol 1300 mg daily   9. Seizure disorder (after [facility]); no reports of seizure activity will continue keppra 500 mg twice daily   10. Thrombocytopenia: stable plt 142 will monitor  11. Essential hypertension: is stable b/p 118/68 will continue hctz 12.5 mg daily   12. Late onset alzheimer's disease without behavioral disturbance: is stable weight is 255 pounds; will continue namenda 5 mg twice daily   13. Simple chronic bronchitis: is stable without recent flares will continue symbicort 80/4.5 mcg 2 puffs twice daily          Alejandra Innocent NP [facility]  Contact [phone] Monday through Friday 8am- 5pm  After hours call [phone]

## 2021-04-13 ENCOUNTER — Encounter: Payer: Self-pay | Admitting: Adult Health

## 2021-04-13 ENCOUNTER — Non-Acute Institutional Stay (SKILLED_NURSING_FACILITY): Payer: Medicare PPO | Admitting: Adult Health

## 2021-04-13 DIAGNOSIS — G301 Alzheimer's disease with late onset: Secondary | ICD-10-CM | POA: Diagnosis not present

## 2021-04-13 DIAGNOSIS — J410 Simple chronic bronchitis: Secondary | ICD-10-CM | POA: Diagnosis not present

## 2021-04-13 DIAGNOSIS — F0281 Dementia in other diseases classified elsewhere with behavioral disturbance: Secondary | ICD-10-CM

## 2021-04-13 DIAGNOSIS — G20A1 Parkinson's disease without dyskinesia, without mention of fluctuations: Secondary | ICD-10-CM

## 2021-04-13 DIAGNOSIS — G20 Parkinson's disease: Secondary | ICD-10-CM

## 2021-04-13 DIAGNOSIS — G40909 Epilepsy, unspecified, not intractable, without status epilepticus: Secondary | ICD-10-CM

## 2021-04-13 DIAGNOSIS — F02818 Dementia in other diseases classified elsewhere, unspecified severity, with other behavioral disturbance: Secondary | ICD-10-CM

## 2021-04-13 NOTE — Progress Notes (Signed)
Location:  [facility] Room Number: 113-W Place of Service:  SNF (31)   CODE STATUS: Full  No Known Allergies  Chief Complaint  Patient presents with   Acute Visit    Care plan meeting    HPI:  We have come together for her care plan meeting. Marland Kitchen BIMS 9/15; mood 3/30: decreased energy. She is nonambulatory propels self in wheelchair. She is extensive assist with adls. She does feed herself. She is frequently incontinent of bladder and bowel. She has had 2 falls: #1 found on bathroom floor; #2: lowered to floor by staff: no injuries. Dietary: weight is 255 pounds down 4 pound this past quarter; no significant has a good appetite. She does attend activities.  Therapy speech therapy for eating too fast to prevent choking. There are no reports of uncontrolled pain. She continues to be followed for her chronic illnesses including: Simple chronic bronchitis Seizure disorder Parkinsons disease  Late onset alzheimer's disease   Past Medical History:  Diagnosis Date   Diverticulosis OCT 2010 TCS   SIGMOID COLON   Esophageal web 2010   DILATION 16 MM   Gastritis, Helicobacter pylori 2010   Rx. ABO for 10 days    GERD (gastroesophageal reflux disease)    Hip fracture, left (HCC)    2016   Hyperlipidemia    Hypertension    Restless legs syndrome    Seizures (HCC) January 2010   Post head trauma in fall .sees Dr Gerilyn Pilgrim    Past Surgical History:  Procedure Laterality Date   ABDOMINAL HYSTERECTOMY     COLONOSCOPY  OCT 2010   TORTUOUS, Sml IH, Tics, MULTIPLE SIMPLE ADENOMAS (<6MM)   HIP ARTHROPLASTY Left 11/19/2014   Procedure: LEFT PARTIAL HIP REPLACEMENT;  Surgeon: Vickki Hearing, MD;  Location: [facility];  Service: Orthopedics;  Laterality: Left;   ORIF HIP FRACTURE  04/05/2012   Procedure: OPEN REDUCTION INTERNAL FIXATION HIP;  Surgeon: Vickki Hearing, MD;  Location: [facility];  Service: Orthopedics;  Laterality: Right;   UPPER GASTROINTESTINAL ENDOSCOPY  AUG  2010   SAVARY   VESICOVAGINAL FISTULA CLOSURE W/ TAH  1966   bleeding     Social History   Socioeconomic History   Marital status: Widowed    Spouse name: Not on file   Number of children: Not on file   Years of education: Not on file   Highest education level: Not on file  Occupational History   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 agent    Occupation: retired   Tobacco Use   Smoking status: Never   Smokeless tobacco: Never  Building services engineer Use: Never used  Substance and Sexual Activity   Alcohol use: No   Drug use: No   Sexual activity: Not Currently  Other Topics Concern   Not on file  Social History Narrative   Long term resident of [facility]    Social Determinants of Health   Financial Resource Strain: Not on file  Food Insecurity: Not on file  Transportation Needs: Not on file  Physical Activity: Not on file  Stress: Not on file  Social Connections: Not on file  Intimate Partner Violence: Not on file   Family History  Problem Relation Age of Onset   Cancer Mother        bladder    Diabetes Mother    Diabetes Father    Emphysema Father    GER disease Brother    Colon cancer Neg Hx  Colon polyps Neg Hx       VITAL SIGNS BP (!) 152/77   Pulse 83   Temp 97.9 F (36.6 C)   Resp 20   Ht 5\' 7"  (1.702 m)   Wt 255 lb (115.7 kg)   SpO2 92%   BMI 39.94 kg/m   Outpatient Encounter Medications as of 04/13/2021  Medication Sig   acetaminophen (TYLENOL) 650 MG CR tablet Take 650 mg by mouth 2 (two) times daily.   aspirin 81 MG chewable tablet Chew 81 mg by mouth daily.   Balsam [place]-Castor Oil [facility]) OINT Special Instructions: Apply to sacrum, coccyx and bilateral buttocks qshift for prevention.   calcium carbonate (TUMS - DOSED IN MG ELEMENTAL CALCIUM) 500 MG chewable tablet Chew 1 tablet by mouth 3 (three) times daily. DX age related osteoporosis without current pathological fracture 07/10/2018 per Dr.Gupta note   Carbidopa-Levodopa ER  (SINEMET CR) 25-100 MG tablet controlled release Take 1 tablet by mouth 3 (three) times daily.   FLUoxetine (PROZAC) 20 MG tablet Take 20 mg by mouth daily.   Foot Care Products (CORN CUSHIONS) PADS Apply 1 Device topically every other day. Apply pad to 2nd toe of R foot every other day until healed   hydrochlorothiazide (HYDRODIURIL) 12.5 MG tablet Take 12.5 mg by mouth daily.   levETIRAcetam (KEPPRA) 500 MG tablet TAKE 1 TABLET BY MOUTH TWICE A DAY AS DIRECTED.   lovastatin (MEVACOR) 20 MG tablet Take 1 tablet (20 mg total) by mouth at bedtime.   Melatonin 5 MG TABS Take 5 mg by mouth at bedtime.   memantine (NAMENDA) 5 MG tablet Take 5 mg by mouth 2 (two) times daily.   NON FORMULARY Diet type:  NAS   potassium chloride (K-DUR,KLOR-CON) 10 MEQ tablet Take 10 mEq by mouth daily.   Salicylic Acid 40 % PADS Apply topically every other day. Special Instructions: to 2nd digit right foot   sennosides-docusate sodium (SENOKOT-S) 8.6-50 MG tablet Take 1 tablet by mouth at bedtime. For Constipation   SYMBICORT 80-4.5 MCG/ACT inhaler Inhale 2 puffs into the lungs 2 (two) times daily.    No facility-administered encounter medications on file as of 04/13/2021.     SIGNIFICANT DIAGNOSTIC EXAMS  PREVIOUS  09-07-19: DEXA: t score -1.06  NO NEW EXAMS.   LABS REVIEWED PREVIOUS  07-25-20: wbc 4.9; hgb 12.1; hct 38.5; mcv 95.1 plt 142; glucose 85; bun 20; creat 0.70; k+ 3.9; na++ 142; ca 8.4 liver normal albumin 3.5  01-23-21 wbc 4.9; hgb 11.5;hct 36.8; mcv 94.4 plt 162; glucose 89; bun 19; creat 0.74; k+ 3.6; na++ 141; ca 8.5 GFR>60 liver normal albumin 3.4; chol 130; ldl 89; trig 61; hdl 42  5-11,13,15-22: guaiac negative   NO NEW LABS.   Review of Systems  Constitutional:  Negative for malaise/fatigue.  Respiratory:  Negative for cough and shortness of breath.   Cardiovascular:  Negative for chest pain, palpitations and leg swelling.  Gastrointestinal:  Negative for abdominal pain, constipation  and heartburn.  Musculoskeletal:  Negative for back pain, joint pain and myalgias.  Skin: Negative.   Neurological:  Negative for dizziness.  Psychiatric/Behavioral:  The patient is not nervous/anxious.    Physical Exam Constitutional:      General: She is not in acute distress.    Appearance: She is well-developed. She is not diaphoretic.  Neck:     Thyroid: No thyromegaly.  Cardiovascular:     Rate and Rhythm: Normal rate and regular rhythm.     Pulses:  Normal pulses.     Heart sounds: Normal heart sounds.  Pulmonary:     Effort: Pulmonary effort is normal. No respiratory distress.     Breath sounds: Normal breath sounds.  Abdominal:     General: Bowel sounds are normal. There is no distension.     Palpations: Abdomen is soft.     Tenderness: There is no abdominal tenderness.  Musculoskeletal:     Cervical back: Neck supple.     Right lower leg: No edema.     Left lower leg: No edema.     Comments: Is able to move all extremities 2016: left hip fracture left hip replacement     Lymphadenopathy:     Cervical: No cervical adenopathy.  Skin:    General: Skin is warm and dry.  Neurological:     Mental Status: She is alert. Mental status is at baseline.  Psychiatric:        Mood and Affect: Mood normal.      ASSESSMENT/ PLAN:  TODAY  Simple chronic bronchitis Seizure disorder Parkinsons disease Late onset alzheimer's disease  Will continue current medications Will continue current plan of care Will continue to monitor her status  Time spent with patient: 40 minutes: review care plan; medications; therapy and dietary needs.    Synthia Innocent NP [facility]  Contact [phone] Monday through Friday 8am- 5pm  After hours call [phone]

## 2021-04-28 ENCOUNTER — Non-Acute Institutional Stay (SKILLED_NURSING_FACILITY): Payer: Medicare PPO | Admitting: Internal Medicine

## 2021-04-28 ENCOUNTER — Encounter: Payer: Self-pay | Admitting: Internal Medicine

## 2021-04-28 DIAGNOSIS — H543 Unqualified visual loss, both eyes: Secondary | ICD-10-CM

## 2021-04-28 DIAGNOSIS — D649 Anemia, unspecified: Secondary | ICD-10-CM

## 2021-04-28 DIAGNOSIS — I10 Essential (primary) hypertension: Secondary | ICD-10-CM | POA: Diagnosis not present

## 2021-04-28 NOTE — Patient Instructions (Signed)
See assessment and plan under each diagnosis in the problem list and acutely for this visit 

## 2021-04-28 NOTE — Assessment & Plan Note (Signed)
01/23/21 H/H 11.5/36, down from H/H 12.1/38.5 FOB neg X 3 Update H/H

## 2021-04-28 NOTE — Progress Notes (Signed)
NURSING HOME LOCATION:  [facility] ROOM NUMBER: [id number]  CODE STATUS: Full  PCP: Synthia Innocent, NP   This is a nursing facility follow up visit of chronic medical diagnoses & to document compliance with Regulation 483.30 (c) in The Long Term Care Survey Manual Phase 2 which mandates caregiver visit ( visits can alternate among physician, PA or NP as per statutes) within 10 days of 30 days / 60 days/ 90 days post admission to SNF date    Interim medical record and care since last SNF visit was updated with review of diagnostic studies and change in clinical status since last visit were documented.  HPI:She is a permanent resident of this facility with medical diagnoses of history of diverticulosis, history of H. pylori gastritis, GERD, dyslipidemia, RLS, essential hypertension, and history of seizures.  Review of systems: She states that upon awakening she had profound loss of vision this morning.  She could not even read the clock on the wall.  This has resolved and she was able to read small print in a religious pamphlet. She denies any other active symptoms.  History is somewhat questionable as she gave the year as 2020.  Constitutional: No fever, significant weight change, fatigue  Eyes: No redness, discharge, pain ENT/mouth: No nasal congestion,  purulent discharge, earache, change in hearing, sore throat  Cardiovascular: No chest pain, palpitations, paroxysmal nocturnal dyspnea, claudication, edema  Respiratory: No cough, sputum production, hemoptysis, DOE, significant snoring, apnea   Gastrointestinal: No heartburn, dysphagia, abdominal pain, nausea /vomiting, rectal bleeding, melena, change in bowels Genitourinary: No dysuria, hematuria, pyuria, incontinence, nocturia Musculoskeletal: No joint stiffness, joint swelling, weakness, pain Dermatologic: No rash, pruritus, change in appearance of skin Neurologic: No dizziness, headache, syncope, seizures, numbness,  tingling Psychiatric: No significant anxiety, depression, insomnia, anorexia Endocrine: No change in hair/skin/nails, excessive thirst, excessive hunger, excessive urination  Hematologic/lymphatic: No significant bruising, lymphadenopathy, abnormal bleeding Allergy/immunology: No itchy/watery eyes, significant sneezing, urticaria, angioedema  Physical exam:  Pertinent or positive findings: She is slightly hard of hearing.  Speech is somewhat slurred and dysarthric.  She exhibits masklike facies.  Extraocular motion is grossly intact.  Vision to confrontation was also grossly intact.  As noted she was able to read extremely fine print in a small pamphlet.  Her mouth is agape.  Heart sounds are distant.  Abdomen is protuberant.  Pedal pulses are palpable.  The lower extremities are stronger than the upper extremities.  She has very fine speckling hypopigmentation over the forearms.  General appearance: Adequately nourished; no acute distress, increased work of breathing is present.   Lymphatic: No lymphadenopathy about the head, neck, axilla. Eyes: No conjunctival inflammation or lid edema is present. There is no scleral icterus. Ears:  External ear exam shows no significant lesions or deformities.   Nose:  External nasal examination shows no deformity or inflammation. Nasal mucosa are pink and moist without lesions, exudates Oral exam:  Lips and gums are healthy appearing. There is no oropharyngeal erythema or exudate. Neck:  No thyromegaly, masses, tenderness noted.    Heart:  No gallop, murmur, click, rub .  Lungs: without wheezes, rhonchi, rales, rubs. Abdomen: Bowel sounds are normal. Abdomen is soft and nontender with no organomegaly, hernias, masses. GU: Deferred  Extremities:  No cyanosis, clubbing, edema  Neurologic exam : Balance, Rhomberg, finger to nose testing could not be completed due to clinical state Skin: Warm & dry w/o tenting. No significant rash.  See summary under each  active  problem in the Problem List with associated updated therapeutic plan

## 2021-04-28 NOTE — Assessment & Plan Note (Signed)
BP controlled; no change in antihypertensive medications  

## 2021-05-30 ENCOUNTER — Encounter (HOSPITAL_COMMUNITY)
Admission: RE | Admit: 2021-05-30 | Discharge: 2021-05-30 | Disposition: A | Discharge status: Home / Self Care | Payer: Medicare PPO | Source: Skilled Nursing Facility | Attending: Adult Health | Admitting: Adult Health

## 2021-05-30 DIAGNOSIS — I10 Essential (primary) hypertension: Secondary | ICD-10-CM | POA: Insufficient documentation

## 2021-05-30 DIAGNOSIS — N390 Urinary tract infection, site not specified: Secondary | ICD-10-CM | POA: Insufficient documentation

## 2021-05-30 LAB — URINALYSIS, ROUTINE W REFLEX MICROSCOPIC
Bilirubin Urine: NEGATIVE
Glucose, UA: NEGATIVE mg/dL
Hgb urine dipstick: NEGATIVE
Ketones, ur: 5 mg/dL — AB
Leukocytes,Ua: NEGATIVE
Nitrite: NEGATIVE
Protein, ur: NEGATIVE mg/dL
Specific Gravity, Urine: 1.028 (ref 1.005–1.030)
pH: 5 (ref 5.0–8.0)

## 2021-05-30 LAB — CBC WITH DIFFERENTIAL/PLATELET
Abs Immature Granulocytes: 0.01 10*3/uL (ref 0.00–0.07)
Basophils Absolute: 0 10*3/uL (ref 0.0–0.1)
Basophils Relative: 1 %
Eosinophils Absolute: 0.1 10*3/uL (ref 0.0–0.5)
Eosinophils Relative: 2 %
HCT: 40.5 % (ref 36.0–46.0)
Hemoglobin: 13.2 g/dL (ref 12.0–15.0)
Immature Granulocytes: 0 %
Lymphocytes Relative: 30 %
Lymphs Abs: 1.4 10*3/uL (ref 0.7–4.0)
MCH: 30.1 pg (ref 26.0–34.0)
MCHC: 32.6 g/dL (ref 30.0–36.0)
MCV: 92.3 fL (ref 80.0–100.0)
Monocytes Absolute: 0.6 10*3/uL (ref 0.1–1.0)
Monocytes Relative: 12 %
Neutro Abs: 2.6 10*3/uL (ref 1.7–7.7)
Neutrophils Relative %: 55 %
Platelets: 156 10*3/uL (ref 150–400)
RBC: 4.39 MIL/uL (ref 3.87–5.11)
RDW: 14.6 % (ref 11.5–15.5)
WBC: 4.7 10*3/uL (ref 4.0–10.5)
nRBC: 0 % (ref 0.0–0.2)

## 2021-05-30 LAB — BASIC METABOLIC PANEL
Anion gap: 7 (ref 5–15)
BUN: 22 mg/dL (ref 8–23)
CO2: 30 mmol/L (ref 22–32)
Calcium: 8.2 mg/dL — ABNORMAL LOW (ref 8.9–10.3)
Chloride: 101 mmol/L (ref 98–111)
Creatinine, Ser: 0.71 mg/dL (ref 0.44–1.00)
GFR, Estimated: >60 mL/min (ref >60)
Glucose, Bld: 119 mg/dL — ABNORMAL HIGH (ref 70–99)
Potassium: 3.6 mmol/L (ref 3.5–5.1)
Sodium: 138 mmol/L (ref 135–145)

## 2021-05-31 ENCOUNTER — Non-Acute Institutional Stay (SKILLED_NURSING_FACILITY): Payer: Medicare PPO | Admitting: Adult Health

## 2021-05-31 ENCOUNTER — Encounter: Payer: Self-pay | Admitting: Adult Health

## 2021-05-31 DIAGNOSIS — F418 Other specified anxiety disorders: Secondary | ICD-10-CM

## 2021-05-31 NOTE — Progress Notes (Signed)
Location:  [facility] Room Number: 116 Place of Service:  SNF (31)   CODE STATUS: full code   No Known Allergies  Chief Complaint  Patient presents with   Acute Visit    Mood state     HPI:  She has been on long term prozac 20 mg daily. Last pm she spoke of wanting to die. Today she tells me that she was very sad last night; that she is sad often. She feels lonely. She denies any suicidal thoughts; has no plan to commit suicide. She does feel like she needs more medication for her mood.   Past Medical History:  Diagnosis Date   Diverticulosis OCT 2010 TCS   SIGMOID COLON   Esophageal web 2010   DILATION 16 MM   Gastritis, Helicobacter pylori 2010   Rx. ABO for 10 days    GERD (gastroesophageal reflux disease)    Hip fracture, left (HCC)    2016   Hyperlipidemia    Hypertension    Restless legs syndrome    Seizures (HCC) January 2010   Post head trauma in fall .sees Dr Gerilyn Pilgrim    Past Surgical History:  Procedure Laterality Date   ABDOMINAL HYSTERECTOMY     COLONOSCOPY  OCT 2010   TORTUOUS, Sml IH, Tics, MULTIPLE SIMPLE ADENOMAS (<6MM)   HIP ARTHROPLASTY Left 11/19/2014   Procedure: LEFT PARTIAL HIP REPLACEMENT;  Surgeon: Vickki Hearing, MD;  Location: [facility];  Service: Orthopedics;  Laterality: Left;   ORIF HIP FRACTURE  04/05/2012   Procedure: OPEN REDUCTION INTERNAL FIXATION HIP;  Surgeon: Vickki Hearing, MD;  Location: [facility];  Service: Orthopedics;  Laterality: Right;   UPPER GASTROINTESTINAL ENDOSCOPY  AUG 2010   SAVARY   VESICOVAGINAL FISTULA CLOSURE W/ TAH  1966   bleeding     Social History   Socioeconomic History   Marital status: Widowed    Spouse name: Not on file   Number of children: Not on file   Years of education: Not on file   Highest education level: Not on file  Occupational History   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 agent    Occupation: retired   Tobacco Use   Smoking status: Never    Smokeless tobacco: Never  Building services engineer Use: Never used  Substance and Sexual Activity   Alcohol use: No   Drug use: No   Sexual activity: Not Currently  Other Topics Concern   Not on file  Social History Narrative   Long term resident of [facility]    Social Determinants of Health   Financial Resource Strain: Not on file  Food Insecurity: Not on file  Transportation Needs: Not on file  Physical Activity: Not on file  Stress: Not on file  Social Connections: Not on file  Intimate Partner Violence: Not on file   Family History  Problem Relation Age of Onset   Cancer Mother        bladder    Diabetes Mother    Diabetes Father    Emphysema Father    GER disease Brother    Colon cancer Neg Hx    Colon polyps Neg Hx       VITAL SIGNS BP 133/80   Pulse 80   Temp 98.6 F (37 C)   Resp 20   Ht 5\' 7"  (1.702 m)   Wt 247 lb 6.4 oz (112.2 kg)   BMI 38.75 kg/m   Outpatient Encounter Medications  as of 05/31/2021  Medication Sig   acetaminophen (TYLENOL) 650 MG CR tablet Take 650 mg by mouth 2 (two) times daily.   aspirin 81 MG chewable tablet Chew 81 mg by mouth daily.   Balsam [place]-Castor Oil [facility]) OINT Special Instructions: Apply to sacrum, coccyx and bilateral buttocks qshift for prevention.   calcium carbonate (TUMS - DOSED IN MG ELEMENTAL CALCIUM) 500 MG chewable tablet Chew 1 tablet by mouth 3 (three) times daily. DX age related osteoporosis without current pathological fracture 07/10/2018 per Dr.Gupta note   Carbidopa-Levodopa ER (SINEMET CR) 25-100 MG tablet controlled release Take 1 tablet by mouth 3 (three) times daily.   FLUoxetine (PROZAC) 20 MG tablet Take 20 mg by mouth daily.   Foot Care Products (CORN CUSHIONS) PADS Apply 1 Device topically every other day. Apply pad to 2nd toe of R foot every other day until healed   hydrochlorothiazide (HYDRODIURIL) 12.5 MG tablet Take 12.5 mg by mouth daily.   levETIRAcetam (KEPPRA) 500 MG tablet TAKE 1 TABLET BY  MOUTH TWICE A DAY AS DIRECTED.   lovastatin (MEVACOR) 20 MG tablet Take 1 tablet (20 mg total) by mouth at bedtime.   Melatonin 5 MG TABS Take 5 mg by mouth at bedtime.   memantine (NAMENDA) 10 MG tablet Take 10 mg by mouth daily.   memantine (NAMENDA) 5 MG tablet Take 5 mg by mouth at bedtime.   NON FORMULARY Diet type:  NAS   potassium chloride (K-DUR,KLOR-CON) 10 MEQ tablet Take 10 mEq by mouth daily.   Salicylic Acid 40 % PADS Apply topically every other day. Special Instructions: to 2nd digit right foot   sennosides-docusate sodium (SENOKOT-S) 8.6-50 MG tablet Take 1 tablet by mouth at bedtime. For Constipation   SYMBICORT 80-4.5 MCG/ACT inhaler Inhale 2 puffs into the lungs 2 (two) times daily.    No facility-administered encounter medications on file as of 05/31/2021.     SIGNIFICANT DIAGNOSTIC EXAMS  PREVIOUS  09-07-19: DEXA: t score -1.06  NO NEW EXAMS.   LABS REVIEWED PREVIOUS  07-25-20: wbc 4.9; hgb 12.1; hct 38.5; mcv 95.1 plt 142; glucose 85; bun 20; creat 0.70; k+ 3.9; na++ 142; ca 8.4 liver normal albumin 3.5  01-23-21 wbc 4.9; hgb 11.5;hct 36.8; mcv 94.4 plt 162; glucose 89; bun 19; creat 0.74; k+ 3.6; na++ 141; ca 8.5 GFR>60 liver normal albumin 3.4; chol 130; ldl 89; trig 61; hdl 42  5-11,13,15-22: guaiac negative   LABS REVIEWED   05-30-21: wbc 4.7; hgb 13.2; hct 40.5; mcv 92.3 plt 156; glucose 119; bun 22; creat 0.7; k+ 3.6; na++ 138; ca 8.2; GFR>60    Review of Systems  Constitutional:  Negative for malaise/fatigue.  Respiratory:  Negative for cough and shortness of breath.   Cardiovascular:  Negative for chest pain, palpitations and leg swelling.  Gastrointestinal:  Negative for abdominal pain, constipation and heartburn.  Musculoskeletal:  Negative for back pain, joint pain and myalgias.  Skin: Negative.   Neurological:  Negative for dizziness.  Psychiatric/Behavioral:  Positive for depression. Negative for suicidal ideas. The patient is not  nervous/anxious and does not have insomnia.     Physical Exam Constitutional:      General: She is not in acute distress.    Appearance: She is well-developed. She is not diaphoretic.  Neck:     Thyroid: No thyromegaly.  Cardiovascular:     Rate and Rhythm: Normal rate and regular rhythm.     Pulses: Normal pulses.     Heart sounds:  Normal heart sounds.  Pulmonary:     Effort: Pulmonary effort is normal. No respiratory distress.     Breath sounds: Normal breath sounds.  Abdominal:     General: Bowel sounds are normal. There is no distension.     Palpations: Abdomen is soft.     Tenderness: There is no abdominal tenderness.  Musculoskeletal:     Cervical back: Neck supple.     Right lower leg: No edema.     Left lower leg: No edema.     Comments: Is able to move all extremities 2016: left hip fracture left hip replacement   Lymphadenopathy:     Cervical: No cervical adenopathy.  Skin:    General: Skin is warm and dry.  Neurological:     Mental Status: She is alert. Mental status is at baseline.  Psychiatric:        Mood and Affect: Mood normal.     ASSESSMENT/ PLAN:  TODAY  Depression with anxiety is worse: will increase to prozac 40 mg daily and will monitor her status.    Alejandra Innocent NP [facility]  Contact [phone] Monday through Friday 8am- 5pm  After hours call [phone]

## 2021-06-01 LAB — URINE CULTURE: Culture: NO GROWTH

## 2021-06-07 ENCOUNTER — Encounter: Payer: Self-pay | Admitting: Adult Health

## 2021-06-07 ENCOUNTER — Non-Acute Institutional Stay (SKILLED_NURSING_FACILITY): Payer: Medicare PPO | Admitting: Adult Health

## 2021-06-07 DIAGNOSIS — F429 Obsessive-compulsive disorder, unspecified: Secondary | ICD-10-CM

## 2021-06-07 DIAGNOSIS — D649 Anemia, unspecified: Secondary | ICD-10-CM

## 2021-06-07 NOTE — Progress Notes (Signed)
Location:  [facility] Room Number: 116 Place of Service:  SNF (31) Synthia Innocent, NP  CODE STATUS: FULL CODE  No Known Allergies  Chief Complaint  Patient presents with   Medical Management of Chronic Issues         Anemia:    Morbid obesity:  Unspecified obsessive compulsive disorder:    HPI:  She is a 87 year old long term resident of this facility being seen for the management of her chronic illnesses: Anemia:    Morbid obesity:  Unspecified obsessive compulsive disorder. There are no reports of uncontrolled pain. Her weight is stable. She needed to have her prozac dose adjusted.   Past Medical History:  Diagnosis Date   Diverticulosis OCT 2010 TCS   SIGMOID COLON   Esophageal web 2010   DILATION 16 MM   Gastritis, Helicobacter pylori 2010   Rx. ABO for 10 days    GERD (gastroesophageal reflux disease)    Hip fracture, left (HCC)    2016   Hyperlipidemia    Hypertension    Restless legs syndrome    Seizures (HCC) January 2010   Post head trauma in fall .sees Dr Gerilyn Pilgrim    Past Surgical History:  Procedure Laterality Date   ABDOMINAL HYSTERECTOMY     COLONOSCOPY  OCT 2010   TORTUOUS, Sml IH, Tics, MULTIPLE SIMPLE ADENOMAS (<6MM)   HIP ARTHROPLASTY Left 11/19/2014   Procedure: LEFT PARTIAL HIP REPLACEMENT;  Surgeon: Vickki Hearing, MD;  Location: [facility];  Service: Orthopedics;  Laterality: Left;   ORIF HIP FRACTURE  04/05/2012   Procedure: OPEN REDUCTION INTERNAL FIXATION HIP;  Surgeon: Vickki Hearing, MD;  Location: [facility];  Service: Orthopedics;  Laterality: Right;   UPPER GASTROINTESTINAL ENDOSCOPY  AUG 2010   SAVARY   VESICOVAGINAL FISTULA CLOSURE W/ TAH  1966   bleeding     Social History   Socioeconomic History   Marital status: Widowed    Spouse name: Not on file   Number of children: Not on file   Years of education: Not on file   Highest education level: Not on file  Occupational History   Occupation: custodian /  nutritionist -Estate agent    Occupation: retired   Tobacco Use   Smoking status: Never   Smokeless tobacco: Never  Building services engineer Use: Never used  Substance and Sexual Activity   Alcohol use: No   Drug use: No   Sexual activity: Not Currently  Other Topics Concern   Not on file  Social History Narrative   Long term resident of [facility]    Social Determinants of Health   Financial Resource Strain: Not on file  Food Insecurity: Not on file  Transportation Needs: Not on file  Physical Activity: Not on file  Stress: Not on file  Social Connections: Not on file  Intimate Partner Violence: Not on file   Family History  Problem Relation Age of Onset   Cancer Mother        bladder    Diabetes Mother    Diabetes Father    Emphysema Father    GER disease Brother    Colon cancer Neg Hx    Colon polyps Neg Hx       VITAL SIGNS There were no vitals taken for this visit.  Outpatient Encounter Medications as of 06/07/2021  Medication Sig   acetaminophen (TYLENOL) 650 MG CR tablet Take 650 mg by mouth 2 (two) times daily.  aspirin 81 MG chewable tablet Chew 81 mg by mouth daily.   Balsam [place]-Castor Oil [facility]) OINT Special Instructions: Apply to sacrum, coccyx and bilateral buttocks qshift for prevention.   calcium carbonate (TUMS - DOSED IN MG ELEMENTAL CALCIUM) 500 MG chewable tablet Chew 1 tablet by mouth 3 (three) times daily. DX age related osteoporosis without current pathological fracture 07/10/2018 per Dr.Gupta note   Carbidopa-Levodopa ER (SINEMET CR) 25-100 MG tablet controlled release Take 1 tablet by mouth 3 (three) times daily.   FLUoxetine (PROZAC) 20 MG tablet Take 20 mg by mouth daily.   Foot Care Products (CORN CUSHIONS) PADS Apply 1 Device topically every other day. Apply pad to 2nd toe of R foot every other day until healed   hydrochlorothiazide (HYDRODIURIL) 12.5 MG tablet Take 12.5 mg by mouth daily.   levETIRAcetam (KEPPRA) 500 MG tablet TAKE 1  TABLET BY MOUTH TWICE A DAY AS DIRECTED.   lovastatin (MEVACOR) 20 MG tablet Take 1 tablet (20 mg total) by mouth at bedtime.   Melatonin 5 MG TABS Take 5 mg by mouth at bedtime.   memantine (NAMENDA) 10 MG tablet Take 10 mg by mouth daily.   Miconazole Nitrate 2 % POWD by Does not apply route. Apply to Redness in folds of skin as Needed   NON FORMULARY Diet type:  NAS   potassium chloride (K-DUR,KLOR-CON) 10 MEQ tablet Take 10 mEq by mouth daily.   Salicylic Acid 40 % PADS Apply topically every other day. Special Instructions: to 2nd digit right foot   sennosides-docusate sodium (SENOKOT-S) 8.6-50 MG tablet Take 1 tablet by mouth at bedtime. For Constipation   SYMBICORT 80-4.5 MCG/ACT inhaler Inhale 2 puffs into the lungs 2 (two) times daily.    [DISCONTINUED] memantine (NAMENDA) 5 MG tablet Take 5 mg by mouth at bedtime.   No facility-administered encounter medications on file as of 06/07/2021.     SIGNIFICANT DIAGNOSTIC EXAMS   PREVIOUS  09-07-19: DEXA: t score -1.06  NO NEW EXAMS.   LABS REVIEWED PREVIOUS  07-25-20: wbc 4.9; hgb 12.1; hct 38.5; mcv 95.1 plt 142; glucose 85; bun 20; creat 0.70; k+ 3.9; na++ 142; ca 8.4 liver normal albumin 3.5  01-23-21 wbc 4.9; hgb 11.5;hct 36.8; mcv 94.4 plt 162; glucose 89; bun 19; creat 0.74; k+ 3.6; na++ 141; ca 8.5 GFR>60 liver normal albumin 3.4; chol 130; ldl 89; trig 61; hdl 42  5-11,13,15-22: guaiac negative   TODAY  05-30-21: wbc 4.7; hgb 13.2; hct 40.5; mcv 92.3 plt 156;glucose119; bun 22; creat 0.71; k+ 3.6; na++ 138; ca 8.2 GFR>60 urine culture no growth      Review of Systems  Constitutional:  Negative for malaise/fatigue.  Respiratory:  Negative for cough and shortness of breath.   Cardiovascular:  Negative for chest pain, palpitations and leg swelling.  Gastrointestinal:  Negative for abdominal pain, constipation and heartburn.  Musculoskeletal:  Negative for back pain, joint pain and myalgias.  Skin: Negative.    Neurological:  Negative for dizziness.  Psychiatric/Behavioral:  The patient is not nervous/anxious.      Physical Exam Constitutional:      General: She is not in acute distress.    Appearance: She is well-developed. She is not diaphoretic.  Neck:     Thyroid: No thyromegaly.  Cardiovascular:     Rate and Rhythm: Normal rate and regular rhythm.     Pulses: Normal pulses.     Heart sounds: Normal heart sounds.  Pulmonary:     Effort: Pulmonary  effort is normal. No respiratory distress.     Breath sounds: Normal breath sounds.  Abdominal:     General: Bowel sounds are normal. There is no distension.     Palpations: Abdomen is soft.     Tenderness: There is no abdominal tenderness.  Musculoskeletal:     Cervical back: Neck supple.     Right lower leg: No edema.     Left lower leg: No edema.     Comments:  Is able to move all extremities 2016: left hip fracture left hip replacement    Lymphadenopathy:     Cervical: No cervical adenopathy.  Skin:    General: Skin is warm and dry.  Neurological:     Mental Status: She is alert. Mental status is at baseline.  Psychiatric:        Mood and Affect: Mood normal.    ASSESSMENT/ PLAN:  TODAY:   Anemia: guaiac negative: hgb 11.5  2. Morbid obesity: BMI 38.75 will monitor  3. Unspecified obsessive compulsive disorder: is stable will continue prozac 40 mg daily has failed 2 weans) melatonin 5 mg nightly   PREVIOUS   4. Chronic generalized pain will continue tylenol 1300 mg daily   5. Seizure disorder (after CHI); no reports of seizure activity will continue keppra 500 mg twice daily   6. Thrombocytopenia: stable plt 142 will monitor  7. Essential hypertension: is stable b/p 120/70 will continue hctz 12.5 mg daily   8. Late onset alzheimer's disease without behavioral disturbance: is stable weight is 247 pounds; will continue namenda 5 mg twice daily   9. Simple chronic bronchitis: is stable without recent flares will  continue symbicort 80/4.5 mcg 2 puffs twice daily   10. Chronic constipation: is stable will continue colace 200 mg daily senna 2 tabs daily and 1 tab nightly   11. Hypokalemia: is stable k+ 3.6 will continue k+ 10 meq daily   12. Unspecified hyperlipidemia: is stable LDL 89 will continue mevacor 20 mg daily      Synthia Innocent NP [facility]  Contact [phone] Monday through Friday 8am- 5pm  After hours call [phone]

## 2021-06-13 ENCOUNTER — Encounter: Payer: Self-pay | Admitting: Adult Health

## 2021-06-13 ENCOUNTER — Non-Acute Institutional Stay (SKILLED_NURSING_FACILITY): Payer: Medicare PPO | Admitting: Adult Health

## 2021-06-13 DIAGNOSIS — Y92129 Unspecified place in nursing home as the place of occurrence of the external cause: Secondary | ICD-10-CM | POA: Diagnosis not present

## 2021-06-13 DIAGNOSIS — M25532 Pain in left wrist: Secondary | ICD-10-CM

## 2021-06-13 DIAGNOSIS — W19XXXA Unspecified fall, initial encounter: Secondary | ICD-10-CM | POA: Diagnosis not present

## 2021-06-13 NOTE — Progress Notes (Signed)
Location:  [facility] Room Number: 116-D Place of Service:  SNF (31)   CODE STATUS: Full Code   No Known Allergies  Chief Complaint  Patient presents with   Acute Visit    Fall follow-up     HPI:  She had a fall on 06-12-21. She now has left wrist swelling and bruising present. She is able to use her arm to propel her wheelchair. She tells me that her left wrist does hurt "a little bit".   Past Medical History:  Diagnosis Date   Diverticulosis OCT 2010 TCS   SIGMOID COLON   Esophageal web 2010   DILATION 16 MM   Gastritis, Helicobacter pylori 2010   Rx. ABO for 10 days    GERD (gastroesophageal reflux disease)    Hip fracture, left (HCC)    2016   Hyperlipidemia    Hypertension    Restless legs syndrome    Seizures (HCC) January 2010   Post head trauma in fall .sees Dr Gerilyn Pilgrim    Past Surgical History:  Procedure Laterality Date   ABDOMINAL HYSTERECTOMY     COLONOSCOPY  OCT 2010   TORTUOUS, Sml IH, Tics, MULTIPLE SIMPLE ADENOMAS (<6MM)   HIP ARTHROPLASTY Left 11/19/2014   Procedure: LEFT PARTIAL HIP REPLACEMENT;  Surgeon: Vickki Hearing, MD;  Location: [facility];  Service: Orthopedics;  Laterality: Left;   ORIF HIP FRACTURE  04/05/2012   Procedure: OPEN REDUCTION INTERNAL FIXATION HIP;  Surgeon: Vickki Hearing, MD;  Location: [facility];  Service: Orthopedics;  Laterality: Right;   UPPER GASTROINTESTINAL ENDOSCOPY  AUG 2010   SAVARY   VESICOVAGINAL FISTULA CLOSURE W/ TAH  1966   bleeding     Social History   Socioeconomic History   Marital status: Widowed    Spouse name: Not on file   Number of children: Not on file   Years of education: Not on file   Highest education level: Not on file  Occupational History   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 agent    Occupation: retired   Tobacco Use   Smoking status: Never   Smokeless tobacco: Never  Building services engineer Use: Never used  Substance and Sexual Activity    Alcohol use: No   Drug use: No   Sexual activity: Not Currently  Other Topics Concern   Not on file  Social History Narrative   Long term resident of [facility]    Social Determinants of Health   Financial Resource Strain: Not on file  Food Insecurity: Not on file  Transportation Needs: Not on file  Physical Activity: Not on file  Stress: Not on file  Social Connections: Not on file  Intimate Partner Violence: Not on file   Family History  Problem Relation Age of Onset   Cancer Mother        bladder    Diabetes Mother    Diabetes Father    Emphysema Father    GER disease Brother    Colon cancer Neg Hx    Colon polyps Neg Hx       VITAL SIGNS BP 132/68   Pulse 72   Temp 98.9 F (37.2 C)   Resp 20   Ht 5\' 7"  (1.702 m)   Wt 252 lb (114.3 kg)   SpO2 92%   BMI 39.47 kg/m   Outpatient Encounter Medications as of 06/13/2021  Medication Sig   acetaminophen (TYLENOL) 650 MG CR tablet Take 650 mg by mouth 2 (  two) times daily.   aspirin 81 MG chewable tablet Chew 81 mg by mouth daily.   Balsam [place]-Castor Oil [facility]) OINT Special Instructions: Apply to sacrum, coccyx and bilateral buttocks qshift for prevention.   calcium carbonate (TUMS - DOSED IN MG ELEMENTAL CALCIUM) 500 MG chewable tablet Chew 1 tablet by mouth 3 (three) times daily. DX age related osteoporosis without current pathological fracture 07/10/2018 per Dr.Gupta note   Carbidopa-Levodopa ER (SINEMET CR) 25-100 MG tablet controlled release Take 1 tablet by mouth 3 (three) times daily.   FLUoxetine (PROZAC) 40 MG capsule Take 40 mg by mouth daily.   hydrochlorothiazide (HYDRODIURIL) 12.5 MG tablet Take 12.5 mg by mouth daily.   levETIRAcetam (KEPPRA) 500 MG tablet TAKE 1 TABLET BY MOUTH TWICE A DAY AS DIRECTED.   lovastatin (MEVACOR) 20 MG tablet Take 1 tablet (20 mg total) by mouth at bedtime.   Melatonin 5 MG TABS Take 5 mg by mouth at bedtime.   memantine (NAMENDA) 10 MG tablet Take 10 mg by mouth daily.    Miconazole Nitrate 2 % POWD by Does not apply route. Apply to Redness in folds of skin as Needed   NON FORMULARY Diet type:  NAS   potassium chloride (K-DUR,KLOR-CON) 10 MEQ tablet Take 10 mEq by mouth daily.   sennosides-docusate sodium (SENOKOT-S) 8.6-50 MG tablet Take 1 tablet by mouth at bedtime. For Constipation   SYMBICORT 80-4.5 MCG/ACT inhaler Inhale 2 puffs into the lungs 2 (two) times daily.    [DISCONTINUED] FLUoxetine (PROZAC) 20 MG tablet Take 20 mg by mouth daily.   [DISCONTINUED] Foot Care Products (CORN CUSHIONS) PADS Apply 1 Device topically every other day. Apply pad to 2nd toe of R foot every other day until healed   [DISCONTINUED] Salicylic Acid 40 % PADS Apply topically every other day. Special Instructions: to 2nd digit right foot   No facility-administered encounter medications on file as of 06/13/2021.     SIGNIFICANT DIAGNOSTIC EXAMS   PREVIOUS  09-07-19: DEXA: t score -1.06  NO NEW EXAMS.   LABS REVIEWED PREVIOUS  07-25-20: wbc 4.9; hgb 12.1; hct 38.5; mcv 95.1 plt 142; glucose 85; bun 20; creat 0.70; k+ 3.9; na++ 142; ca 8.4 liver normal albumin 3.5  01-23-21 wbc 4.9; hgb 11.5;hct 36.8; mcv 94.4 plt 162; glucose 89; bun 19; creat 0.74; k+ 3.6; na++ 141; ca 8.5 GFR>60 liver normal albumin 3.4; chol 130; ldl 89; trig 61; hdl 42  5-11,13,15-22: guaiac negative  05-30-21: wbc 4.7; hgb 13.2; hct 40.5; mcv 92.3 plt 156;glucose119; bun 22; creat 0.71; k+ 3.6; na++ 138; ca 8.2 GFR>60 urine culture no growth  NO NEW LABS.   Review of Systems  Constitutional:  Negative for malaise/fatigue.  Respiratory:  Negative for cough and shortness of breath.   Cardiovascular:  Negative for chest pain, palpitations and leg swelling.  Gastrointestinal:  Negative for abdominal pain, constipation and heartburn.  Musculoskeletal:  Positive for joint pain. Negative for back pain and myalgias.       Mild left wrist pain   Skin: Negative.   Neurological:  Negative for dizziness.   Psychiatric/Behavioral:  The patient is not nervous/anxious.      Physical Exam Constitutional:      General: She is not in acute distress.    Appearance: She is well-developed. She is obese. She is not diaphoretic.  Neck:     Thyroid: No thyromegaly.  Cardiovascular:     Rate and Rhythm: Normal rate and regular rhythm.  Heart sounds: Normal heart sounds.  Pulmonary:     Effort: Pulmonary effort is normal. No respiratory distress.     Breath sounds: Normal breath sounds.  Abdominal:     General: Bowel sounds are normal. There is no distension.     Palpations: Abdomen is soft.     Tenderness: There is no abdominal tenderness.  Musculoskeletal:     Cervical back: Neck supple.     Right lower leg: No edema.     Left lower leg: No edema.     Comments:   Is able to move all extremities 2016: left hip fracture left hip replacement    Left wrist: slight swelling present; slight bruising present no warmth present   Lymphadenopathy:     Cervical: No cervical adenopathy.  Skin:    General: Skin is warm and dry.  Neurological:     Mental Status: She is alert. Mental status is at baseline.  Psychiatric:        Mood and Affect: Mood normal.      ASSESSMENT/ PLAN:  TODAY   Fall at nursing home: will get an x-ray of left wrist and hand and will further treat as indicated.    Synthia Innocent NP [facility]  Contact [phone] Monday through Friday 8am- 5pm  After hours call [phone]

## 2021-07-07 ENCOUNTER — Encounter: Payer: Self-pay | Admitting: Adult Health

## 2021-07-07 ENCOUNTER — Non-Acute Institutional Stay (SKILLED_NURSING_FACILITY): Payer: Medicare PPO | Admitting: Adult Health

## 2021-07-07 DIAGNOSIS — G2401 Drug induced subacute dyskinesia: Secondary | ICD-10-CM | POA: Diagnosis not present

## 2021-07-07 DIAGNOSIS — R52 Pain, unspecified: Secondary | ICD-10-CM | POA: Diagnosis not present

## 2021-07-07 DIAGNOSIS — G20 Parkinson's disease: Secondary | ICD-10-CM

## 2021-07-07 DIAGNOSIS — G40909 Epilepsy, unspecified, not intractable, without status epilepticus: Secondary | ICD-10-CM

## 2021-07-07 DIAGNOSIS — G20A1 Parkinson's disease without dyskinesia, without mention of fluctuations: Secondary | ICD-10-CM

## 2021-07-07 DIAGNOSIS — G8929 Other chronic pain: Secondary | ICD-10-CM

## 2021-07-07 NOTE — Progress Notes (Signed)
Location:  [facility] Room Number: 160-P Place of Service:  SNF (31)   CODE STATUS: Full code  No Known Allergies  Chief Complaint  Patient presents with   Medical Management of Chronic Issues             Parkinson's disease:   Tardive dyskinesia: . Chronic generalized pain: Seizure disorder:    HPI:  She is a 87 year old long term resident of this facility being seen for the management of her chronic illnesses: Parkinson's disease:   Tardive dyskinesia: . Chronic generalized pain: Seizure disorder:. There are no reports of uncontrolled pain; no reports of anxiety or agitation. She has been started on ingrezza by psych services due to abnormal movements   Past Medical History:  Diagnosis Date   Diverticulosis OCT 2010 TCS   SIGMOID COLON   Esophageal web 2010   DILATION 16 MM   Gastritis, Helicobacter pylori 2010   Rx. ABO for 10 days    GERD (gastroesophageal reflux disease)    Hip fracture, left (HCC)    2016   Hyperlipidemia    Hypertension    Restless legs syndrome    Seizures (HCC) January 2010   Post head trauma in fall .sees Dr Gerilyn Pilgrim    Past Surgical History:  Procedure Laterality Date   ABDOMINAL HYSTERECTOMY     COLONOSCOPY  OCT 2010   TORTUOUS, Sml IH, Tics, MULTIPLE SIMPLE ADENOMAS (<6MM)   HIP ARTHROPLASTY Left 11/19/2014   Procedure: LEFT PARTIAL HIP REPLACEMENT;  Surgeon: Vickki Hearing, MD;  Location: [facility];  Service: Orthopedics;  Laterality: Left;   ORIF HIP FRACTURE  04/05/2012   Procedure: OPEN REDUCTION INTERNAL FIXATION HIP;  Surgeon: Vickki Hearing, MD;  Location: [facility];  Service: Orthopedics;  Laterality: Right;   UPPER GASTROINTESTINAL ENDOSCOPY  AUG 2010   SAVARY   VESICOVAGINAL FISTULA CLOSURE W/ TAH  1966   bleeding     Social History   Socioeconomic History   Marital status: Widowed    Spouse name: Not on file   Number of children: Not on file   Years of education: Not on file   Highest education  level: Not on file  Occupational History   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 agent    Occupation: retired   Tobacco Use   Smoking status: Never   Smokeless tobacco: Never  Building services engineer Use: Never used  Substance and Sexual Activity   Alcohol use: No   Drug use: No   Sexual activity: Not Currently  Other Topics Concern   Not on file  Social History Narrative   Long term resident of [facility]    Social Determinants of Health   Financial Resource Strain: Not on file  Food Insecurity: Not on file  Transportation Needs: Not on file  Physical Activity: Not on file  Stress: Not on file  Social Connections: Not on file  Intimate Partner Violence: Not on file   Family History  Problem Relation Age of Onset   Cancer Mother        bladder    Diabetes Mother    Diabetes Father    Emphysema Father    GER disease Brother    Colon cancer Neg Hx    Colon polyps Neg Hx       VITAL SIGNS BP 111/66   Pulse 69   Temp (!) 97.2 F (36.2 C)   Resp 20   Ht 5\' 7"  (1.702  m)   Wt 252 lb 6.4 oz (114.5 kg)   SpO2 92%   BMI 39.53 kg/m   Outpatient Encounter Medications as of 07/07/2021  Medication Sig   acetaminophen (TYLENOL) 650 MG CR tablet Take 650 mg by mouth 2 (two) times daily.   aspirin 81 MG chewable tablet Chew 81 mg by mouth daily.   Balsam [place]-Castor Oil [facility]) OINT Special Instructions: Apply to sacrum, coccyx and bilateral buttocks qshift for prevention.   calcium carbonate (TUMS - DOSED IN MG ELEMENTAL CALCIUM) 500 MG chewable tablet Chew 1 tablet by mouth 3 (three) times daily. DX age related osteoporosis without current pathological fracture 07/10/2018 per Dr.Gupta note   Carbidopa-Levodopa ER (SINEMET CR) 25-100 MG tablet controlled release Take 1 tablet by mouth 3 (three) times daily.   FLUoxetine (PROZAC) 40 MG capsule Take 40 mg by mouth daily.   hydrochlorothiazide (HYDRODIURIL) 12.5 MG tablet Take 12.5 mg by mouth daily.   levETIRAcetam  (KEPPRA) 500 MG tablet TAKE 1 TABLET BY MOUTH TWICE A DAY AS DIRECTED.   lovastatin (MEVACOR) 20 MG tablet Take 1 tablet (20 mg total) by mouth at bedtime.   Melatonin 5 MG TABS Take 5 mg by mouth at bedtime.   memantine (NAMENDA) 10 MG tablet Take 10 mg by mouth daily.   Miconazole Nitrate 2 % POWD by Does not apply route. Apply to Redness in folds of skin as Needed   NON FORMULARY Diet type: Regular   potassium chloride (K-DUR,KLOR-CON) 10 MEQ tablet Take 10 mEq by mouth daily.   sennosides-docusate sodium (SENOKOT-S) 8.6-50 MG tablet Take 1 tablet by mouth at bedtime. For Constipation   SYMBICORT 80-4.5 MCG/ACT inhaler Inhale 2 puffs into the lungs 2 (two) times daily.    valbenazine (INGREZZA) 40 MG capsule Take 40 mg by mouth at bedtime.   No facility-administered encounter medications on file as of 07/07/2021.     SIGNIFICANT DIAGNOSTIC EXAMS  PREVIOUS  09-07-19: DEXA: t score -1.06  NO NEW EXAMS.   LABS REVIEWED PREVIOUS  07-25-20: wbc 4.9; hgb 12.1; hct 38.5; mcv 95.1 plt 142; glucose 85; bun 20; creat 0.70; k+ 3.9; na++ 142; ca 8.4 liver normal albumin 3.5  01-23-21 wbc 4.9; hgb 11.5;hct 36.8; mcv 94.4 plt 162; glucose 89; bun 19; creat 0.74; k+ 3.6; na++ 141; ca 8.5 GFR>60 liver normal albumin 3.4; chol 130; ldl 89; trig 61; hdl 42  5-11,13,15-22: guaiac negative  05-30-21: wbc 4.7; hgb 13.2; hct 40.5; mcv 92.3 plt 156;glucose119; bun 22; creat 0.71; k+ 3.6; na++ 138; ca 8.2 GFR>60 urine culture no growth  NO NEW LABS.      Review of Systems  Constitutional:  Negative for malaise/fatigue.  Respiratory:  Negative for cough and shortness of breath.   Cardiovascular:  Negative for chest pain, palpitations and leg swelling.  Gastrointestinal:  Negative for abdominal pain, constipation and heartburn.  Musculoskeletal:  Negative for back pain, joint pain and myalgias.  Skin: Negative.   Neurological:  Negative for dizziness.  Psychiatric/Behavioral:  The patient is not  nervous/anxious.     Physical Exam Constitutional:      General: She is not in acute distress.    Appearance: She is well-developed. She is not diaphoretic.  Neck:     Thyroid: No thyromegaly.  Cardiovascular:     Rate and Rhythm: Normal rate and regular rhythm.     Pulses: Normal pulses.     Heart sounds: Normal heart sounds.  Pulmonary:     Effort: Pulmonary effort  is normal. No respiratory distress.     Breath sounds: Normal breath sounds.  Abdominal:     General: Bowel sounds are normal. There is no distension.     Palpations: Abdomen is soft.     Tenderness: There is no abdominal tenderness.  Musculoskeletal:     Cervical back: Neck supple.     Right lower leg: No edema.     Left lower leg: No edema.     Comments:   Is able to move all extremities 2016: left hip fracture left hip replacement     Lymphadenopathy:     Cervical: No cervical adenopathy.  Skin:    General: Skin is warm and dry.  Neurological:     Mental Status: She is alert. Mental status is at baseline.  Psychiatric:        Mood and Affect: Mood normal.      ASSESSMENT/ PLAN:  TODAY:   Parkinson's disease: is stable will continue sinemet cr 25/100 mg three times daily   2. Tardive dyskinesia: is without change: has recently been started on ingrezza 40 mg daily   3. Chronic generalized pain: is stable will continue tylenol 650 mg twice daily  4. Seizure disorder: (after CHI) no reports of seizure activity will continue keppra 500 mg twice daily    PREVIOUS   5. Thrombocytopenia: stable plt 142 will monitor  6. Essential hypertension: is stable b/p 120/70 will continue hctz 12.5 mg daily   7. Late onset alzheimer's disease without behavioral disturbance: is stable weight is 252 pounds; will continue namenda 5 mg twice daily   8. Simple chronic bronchitis: is stable without recent flares will continue symbicort 80/4.5 mcg 2 puffs twice daily   9. Chronic constipation: is stable will continue  senna s daily    10. Hypokalemia: is stable k+ 3.6 will continue k+ 10 meq daily   11. Unspecified hyperlipidemia: is stable LDL 89 will continue mevacor 20 mg daily   12. Anemia: guaiac negative: hgb 11.5  13. Morbid obesity: BMI 39.53 will monitor  14. Unspecified obsessive compulsive disorder: is stable will continue prozac 40 mg daily has failed 2 weans) melatonin 5 mg nightly     Synthia Innocent NP [facility]  Contact [phone] Monday through Friday 8am- 5pm  After hours call [phone]

## 2021-07-11 DIAGNOSIS — G2401 Drug induced subacute dyskinesia: Secondary | ICD-10-CM | POA: Insufficient documentation

## 2021-07-14 ENCOUNTER — Encounter: Payer: Self-pay | Admitting: Adult Health

## 2021-07-14 ENCOUNTER — Non-Acute Institutional Stay (SKILLED_NURSING_FACILITY): Payer: Medicare PPO | Admitting: Adult Health

## 2021-07-14 DIAGNOSIS — F03918 Unspecified dementia, unspecified severity, with other behavioral disturbance: Secondary | ICD-10-CM | POA: Diagnosis not present

## 2021-07-14 DIAGNOSIS — G20 Parkinson's disease: Secondary | ICD-10-CM | POA: Diagnosis not present

## 2021-07-14 DIAGNOSIS — Z6838 Body mass index (BMI) 38.0-38.9, adult: Secondary | ICD-10-CM

## 2021-07-14 DIAGNOSIS — G40909 Epilepsy, unspecified, not intractable, without status epilepticus: Secondary | ICD-10-CM

## 2021-07-14 NOTE — Progress Notes (Signed)
Location:  [facility] Room Number: 116-D Place of Service:  SNF (31)   CODE STATUS: Full code  No Known Allergies  Chief Complaint  Patient presents with   Acute Visit    Care plan meeting    HPI:  We have come together for her care plan meeting: family present.  BIMS 8/15 mood 5/30: no energy; becomes nervous; verbally abusive with staff. She requires extensive assist with her adls. She is nonambulatory and propels self in wheelchair (mostly backwards).  She is frequently incontinent of bladder and bowel. She has had 3 falls without injury. Therapy none at this time stopped 2-3 weeks ago; ambulated with walker 50 feet with minimal assist.  Dietary: feeds self with setup regular diet good appetite; weight is stable at 247 pounds.   She continues to be followed for her chronic illnesses including:   Morbid obesity  Dementia with behavioral disturbance  Parkinson's disease  Seizure disorder.       Family continues to desire for her to be a full code.   Past Medical History:  Diagnosis Date   Diverticulosis OCT 2010 TCS   SIGMOID COLON   Esophageal web 2010   DILATION 16 MM   Gastritis, Helicobacter pylori 2010   Rx. ABO for 10 days    GERD (gastroesophageal reflux disease)    Hip fracture, left (HCC)    2016   Hyperlipidemia    Hypertension    Restless legs syndrome    Seizures (HCC) January 2010   Post head trauma in fall .sees Dr Gerilyn Pilgrim    Past Surgical History:  Procedure Laterality Date   ABDOMINAL HYSTERECTOMY     COLONOSCOPY  OCT 2010   TORTUOUS, Sml IH, Tics, MULTIPLE SIMPLE ADENOMAS (<6MM)   HIP ARTHROPLASTY Left 11/19/2014   Procedure: LEFT PARTIAL HIP REPLACEMENT;  Surgeon: Vickki Hearing, MD;  Location: [facility];  Service: Orthopedics;  Laterality: Left;   ORIF HIP FRACTURE  04/05/2012   Procedure: OPEN REDUCTION INTERNAL FIXATION HIP;  Surgeon: Vickki Hearing, MD;  Location: [facility];  Service: Orthopedics;  Laterality: Right;    UPPER GASTROINTESTINAL ENDOSCOPY  AUG 2010   SAVARY   VESICOVAGINAL FISTULA CLOSURE W/ TAH  1966   bleeding     Social History   Socioeconomic History   Marital status: Widowed    Spouse name: Not on file   Number of children: Not on file   Years of education: Not on file   Highest education level: Not on file  Occupational History   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 agent    Occupation: retired   Tobacco Use   Smoking status: Never   Smokeless tobacco: Never  Building services engineer Use: Never used  Substance and Sexual Activity   Alcohol use: No   Drug use: No   Sexual activity: Not Currently  Other Topics Concern   Not on file  Social History Narrative   Long term resident of [facility]    Social Determinants of Health   Financial Resource Strain: Not on file  Food Insecurity: Not on file  Transportation Needs: Not on file  Physical Activity: Not on file  Stress: Not on file  Social Connections: Not on file  Intimate Partner Violence: Not on file   Family History  Problem Relation Age of Onset   Cancer Mother        bladder    Diabetes Mother    Diabetes Father  Emphysema Father    GER disease Brother    Colon cancer Neg Hx    Colon polyps Neg Hx       VITAL SIGNS BP 138/74   Pulse 67   Temp 97.9 F (36.6 C)   Resp 20   Ht 5\' 7"  (1.702 m)   Wt 247 lb 6.4 oz (112.2 kg)   SpO2 92%   BMI 38.75 kg/m   Outpatient Encounter Medications as of 07/14/2021  Medication Sig   acetaminophen (TYLENOL) 650 MG CR tablet Take 650 mg by mouth 2 (two) times daily.   aspirin 81 MG chewable tablet Chew 81 mg by mouth daily.   Balsam [place]-Castor Oil [facility]) OINT Special Instructions: Apply to sacrum, coccyx and bilateral buttocks qshift for prevention.   calcium carbonate (TUMS - DOSED IN MG ELEMENTAL CALCIUM) 500 MG chewable tablet Chew 1 tablet by mouth 3 (three) times daily. DX age related osteoporosis without current pathological fracture 07/10/2018  per Dr.Gupta note   Carbidopa-Levodopa ER (SINEMET CR) 25-100 MG tablet controlled release Take 1 tablet by mouth 3 (three) times daily.   FLUoxetine (PROZAC) 40 MG capsule Take 40 mg by mouth daily.   hydrochlorothiazide (HYDRODIURIL) 12.5 MG tablet Take 12.5 mg by mouth daily.   levETIRAcetam (KEPPRA) 500 MG tablet TAKE 1 TABLET BY MOUTH TWICE A DAY AS DIRECTED.   lovastatin (MEVACOR) 20 MG tablet Take 1 tablet (20 mg total) by mouth at bedtime.   Melatonin 5 MG TABS Take 5 mg by mouth at bedtime.   memantine (NAMENDA) 10 MG tablet Take 10 mg by mouth daily.   Miconazole Nitrate 2 % POWD by Does not apply route. Apply to Redness in folds of skin as Needed   NON FORMULARY Diet type: Regular   potassium chloride (K-DUR,KLOR-CON) 10 MEQ tablet Take 10 mEq by mouth daily.   sennosides-docusate sodium (SENOKOT-S) 8.6-50 MG tablet Take 1 tablet by mouth at bedtime. For Constipation   SYMBICORT 80-4.5 MCG/ACT inhaler Inhale 2 puffs into the lungs 2 (two) times daily.    valbenazine (INGREZZA) 40 MG capsule Take 40 mg by mouth at bedtime.   No facility-administered encounter medications on file as of 07/14/2021.     SIGNIFICANT DIAGNOSTIC EXAMS  PREVIOUS  09-07-19: DEXA: t score -1.06  NO NEW EXAMS.   LABS REVIEWED PREVIOUS  07-25-20: wbc 4.9; hgb 12.1; hct 38.5; mcv 95.1 plt 142; glucose 85; bun 20; creat 0.70; k+ 3.9; na++ 142; ca 8.4 liver normal albumin 3.5  01-23-21 wbc 4.9; hgb 11.5;hct 36.8; mcv 94.4 plt 162; glucose 89; bun 19; creat 0.74; k+ 3.6; na++ 141; ca 8.5 GFR>60 liver normal albumin 3.4; chol 130; ldl 89; trig 61; hdl 42  5-11,13,15-22: guaiac negative  05-30-21: wbc 4.7; hgb 13.2; hct 40.5; mcv 92.3 plt 156;glucose119; bun 22; creat 0.71; k+ 3.6; na++ 138; ca 8.2 GFR>60 urine culture no growth  NO NEW LABS.     Review of Systems  Constitutional:  Negative for malaise/fatigue.  Respiratory:  Negative for cough and shortness of breath.   Cardiovascular:  Negative for  chest pain, palpitations and leg swelling.  Gastrointestinal:  Negative for abdominal pain, constipation and heartburn.  Musculoskeletal:  Negative for back pain, joint pain and myalgias.  Skin: Negative.   Neurological:  Negative for dizziness.  Psychiatric/Behavioral:  The patient is not nervous/anxious.    Physical Exam Constitutional:      General: She is not in acute distress.    Appearance: She is well-developed. She  is not diaphoretic.  Neck:     Thyroid: No thyromegaly.  Cardiovascular:     Rate and Rhythm: Normal rate and regular rhythm.     Pulses: Normal pulses.     Heart sounds: Normal heart sounds.  Pulmonary:     Effort: Pulmonary effort is normal. No respiratory distress.     Breath sounds: Normal breath sounds.  Abdominal:     General: Bowel sounds are normal. There is no distension.     Palpations: Abdomen is soft.     Tenderness: There is no abdominal tenderness.  Musculoskeletal:     Cervical back: Neck supple.     Right lower leg: No edema.     Left lower leg: No edema.     Comments: Is able to move all extremities 2016: left hip fracture left hip replacement      Lymphadenopathy:     Cervical: No cervical adenopathy.  Skin:    General: Skin is warm and dry.  Neurological:     Mental Status: She is alert. Mental status is at baseline.  Psychiatric:        Mood and Affect: Mood normal.     ASSESSMENT/ PLAN:  TODAY  Morbid obesity Dementia with behavioral disturbance Parkinson's disease Seizure disorder   Will continue current medications Will continue current plan of care Will continue to monitor her status.   Time spent with patient: 40 minutes: medications; plan of care.   Synthia Innocent NP [facility]  Contact [phone] Monday through Friday 8am- 5pm  After hours call [phone]

## 2021-07-20 DIAGNOSIS — M25532 Pain in left wrist: Secondary | ICD-10-CM | POA: Insufficient documentation

## 2021-08-04 ENCOUNTER — Non-Acute Institutional Stay (SKILLED_NURSING_FACILITY): Payer: Medicare PPO | Admitting: Internal Medicine

## 2021-08-04 ENCOUNTER — Encounter: Payer: Self-pay | Admitting: Internal Medicine

## 2021-08-04 DIAGNOSIS — I10 Essential (primary) hypertension: Secondary | ICD-10-CM | POA: Diagnosis not present

## 2021-08-04 DIAGNOSIS — F03918 Unspecified dementia, unspecified severity, with other behavioral disturbance: Secondary | ICD-10-CM

## 2021-08-04 DIAGNOSIS — D696 Thrombocytopenia, unspecified: Secondary | ICD-10-CM | POA: Diagnosis not present

## 2021-08-04 DIAGNOSIS — E785 Hyperlipidemia, unspecified: Secondary | ICD-10-CM | POA: Diagnosis not present

## 2021-08-04 NOTE — Progress Notes (Signed)
NURSING HOME LOCATION: [facility] ROOM NUMBER:  116 D  CODE STATUS:  Full Code  PCP:  Synthia Innocent NP  This is a nursing facility follow up visit of chronic medical diagnoses & to document compliance with Regulation 483.30 (c) in The Long Term Care Survey Manual Phase 2 which mandates caregiver visit ( visits can alternate among physician, PA or NP as per statutes) within 10 days of 30 days / 60 days/ 90 days post admission to SNF date    Interim medical record and care since last SNF visit was updated with review of diagnostic studies and change in clinical status since last visit were documented.  HPI: She has been a permanent resident of this facility since 2016.  Medical diagnoses include COPD, morbid obesity, dyslipidemia, essential hypertension, history of diverticulosis, history of GERD with esophageal web, RLS, tardive dyskinesia, and history of seizures. Most recent labs were reviewed.  LDL is 78 on the statin.  She has CKD stage II with a creatinine of 0.71 and GFR greater than 60.  Anemia has resolved with present H/H of 13.2/40.5.  Review of systems: Dementia invalidated responses.BIMs score 8/15.  Staff reports she is lethargic but intermittently anxious and is intermittently verbally abusive to staff.  She requires extensive assistance with ADLs as she is nonambulatory and propels self backwards in the wheelchair.  Bladder and bowel incontinence is a frequent occurrence & she has had recurrent falls without significant injury. Despite her advanced age and significant comorbidities; family continues to request full code status. She initially denied any active symptoms but then did validate intermittent constipation.  She went on to say "I do not have many things".  I verified she was referring to health issues rather than possessions.  Physical exam:  Pertinent or positive findings:Morbid obesity is present.  She is slightly hard of hearing.  Clinically she  seems somewhat wary when I asked to interview and examine her.  Lower lids are puffy.  She has arcus senilis.  There is alternating exotropia.  She is completely edentulous and not wearing dentures.  Heart sounds are markedly distant.  Faint flow murmur is suggested.  Breath sounds are also decreased.  Abdomen is protuberant.  She is wearing compression hose.  Pedal pulses are not palpable.  Slight clubbing the nailbeds is suggested.  She is wearing false nails.  She is weak to opposition in all extremities.  She has faint, tiny vitiliginous spots over the dorsum of the hands.  General appearance: Adequately nourished; no acute distress, increased work of breathing is present.   Lymphatic: No lymphadenopathy about the head, neck, axilla. Eyes: No conjunctival inflammation or lid edema is present. There is no scleral icterus. Ears:  External ear exam shows no significant lesions or deformities.   Nose:  External nasal examination shows no deformity or inflammation. Nasal mucosa are pink and moist without lesions, exudates Oral exam:There is no oropharyngeal erythema or exudate. Neck:  No thyromegaly, masses, tenderness noted.    Heart:  No gallop,click, rub .  Lungs:  without wheezes, rhonchi, rales, rubs. Abdomen: Bowel sounds are normal. Abdomen is soft and nontender with no organomegaly, hernias, masses. GU: Deferred  Extremities:  No cyanosis, clubbing, edema  Neurologic exam :Balance, Rhomberg, finger to nose testing could not be completed due to clinical state Skin: Warm & dry w/o tenting. No significant  rash.  See summary under each active problem in the Problem List with associated updated therapeutic plan

## 2021-08-04 NOTE — Assessment & Plan Note (Signed)
BP controlled; no change in antihypertensive medications  

## 2021-08-04 NOTE — Assessment & Plan Note (Signed)
Behavioral issues are outlined in today's notes.

## 2021-08-04 NOTE — Assessment & Plan Note (Signed)
Current platelet count is normal.  No bleeding dyscrasias reported.

## 2021-08-04 NOTE — Assessment & Plan Note (Addendum)
LDL is presently 78 on statin.  No indication for change in medication.  If it does rise greater than 100; generic rosuvastatin or adding generic Zetia would be options.

## 2021-08-04 NOTE — Patient Instructions (Signed)
See assessment and plan under each diagnosis in the problem list and acutely for this visit 

## 2021-08-17 ENCOUNTER — Encounter: Payer: Self-pay | Admitting: Adult Health

## 2021-08-17 ENCOUNTER — Other Ambulatory Visit (HOSPITAL_COMMUNITY)
Admission: RE | Admit: 2021-08-17 | Discharge: 2021-08-17 | Disposition: A | Discharge status: Home / Self Care | Payer: Medicare PPO | Source: Skilled Nursing Facility | Attending: Adult Health | Admitting: Adult Health

## 2021-08-17 ENCOUNTER — Inpatient Hospital Stay
Admission: RE | Admit: 2021-08-17 | Discharge: 2021-08-17 | Disposition: A | Discharge status: Home / Self Care | Payer: Medicare PPO | Source: Skilled Nursing Facility | Attending: Adult Health | Admitting: Adult Health

## 2021-08-17 DIAGNOSIS — I10 Essential (primary) hypertension: Secondary | ICD-10-CM | POA: Insufficient documentation

## 2021-08-17 LAB — D-DIMER, QUANTITATIVE: D-Dimer, Quant: 6.82 ug/mL-FEU — ABNORMAL HIGH (ref 0.00–0.50)

## 2021-08-17 NOTE — Progress Notes (Signed)
Location:  [facility] Room Number: 116 Place of Service:  SNF (31)   CODE STATUS: full code   No Known Allergies  Chief Complaint  Patient presents with   Acute Visit    Follow up labs    Error    HPI:    Past Medical History:  Diagnosis Date   Diverticulosis OCT 2010 TCS   SIGMOID COLON   Esophageal web 2010   DILATION 16 MM   Gastritis, Helicobacter pylori 2010   Rx. ABO for 10 days    GERD (gastroesophageal reflux disease)    Hip fracture, left (HCC)    2016   Hyperlipidemia    Hypertension    Restless legs syndrome    Seizures (HCC) January 2010   Post head trauma in fall .sees Dr Gerilyn Pilgrim    Past Surgical History:  Procedure Laterality Date   ABDOMINAL HYSTERECTOMY     COLONOSCOPY  OCT 2010   TORTUOUS, Sml IH, Tics, MULTIPLE SIMPLE ADENOMAS (<6MM)   HIP ARTHROPLASTY Left 11/19/2014   Procedure: LEFT PARTIAL HIP REPLACEMENT;  Surgeon: Vickki Hearing, MD;  Location: [facility];  Service: Orthopedics;  Laterality: Left;   ORIF HIP FRACTURE  04/05/2012   Procedure: OPEN REDUCTION INTERNAL FIXATION HIP;  Surgeon: Vickki Hearing, MD;  Location: [facility];  Service: Orthopedics;  Laterality: Right;   UPPER GASTROINTESTINAL ENDOSCOPY  AUG 2010   SAVARY   VESICOVAGINAL FISTULA CLOSURE W/ TAH  1966   bleeding     Social History   Socioeconomic History   Marital status: Widowed    Spouse name: Not on file   Number of children: Not on file   Years of education: Not on file   Highest education level: Not on file  Occupational History   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 agent    Occupation: retired   Tobacco Use   Smoking status: Never   Smokeless tobacco: Never  Building services engineer Use: Never used  Substance and Sexual Activity   Alcohol use: No   Drug use: No   Sexual activity: Not Currently  Other Topics Concern   Not on file  Social History Narrative   Long term resident of [facility]    Social Determinants of  Health   Financial Resource Strain: Not on file  Food Insecurity: Not on file  Transportation Needs: Not on file  Physical Activity: Not on file  Stress: Not on file  Social Connections: Not on file  Intimate Partner Violence: Not on file   Family History  Problem Relation Age of Onset   Cancer Mother        bladder    Diabetes Mother    Diabetes Father    Emphysema Father    GER disease Brother    Colon cancer Neg Hx    Colon polyps Neg Hx       VITAL SIGNS BP 125/76   Pulse 79   Temp 98.1 F (36.7 C)   Ht 5\' 7"  (1.702 m)   Wt 243 lb 9.6 oz (110.5 kg)   BMI 38.15 kg/m   Outpatient Encounter Medications as of 08/17/2021  Medication Sig   acetaminophen (TYLENOL) 650 MG CR tablet Take 650 mg by mouth 2 (two) times daily.   aspirin 81 MG chewable tablet Chew 81 mg by mouth daily.   Balsam [place]-Castor Oil [facility]) OINT Special Instructions: Apply to sacrum, coccyx and bilateral buttocks qshift for prevention.   calcium carbonate (  TUMS - DOSED IN MG ELEMENTAL CALCIUM) 500 MG chewable tablet Chew 1 tablet by mouth 3 (three) times daily. DX age related osteoporosis without current pathological fracture 07/10/2018 per Dr.Gupta note   Carbidopa-Levodopa ER (SINEMET CR) 25-100 MG tablet controlled release Take 1 tablet by mouth 3 (three) times daily.   FLUoxetine (PROZAC) 40 MG capsule Take 40 mg by mouth daily.   hydrochlorothiazide (HYDRODIURIL) 12.5 MG tablet Take 12.5 mg by mouth daily.   levETIRAcetam (KEPPRA) 500 MG tablet TAKE 1 TABLET BY MOUTH TWICE A DAY AS DIRECTED.   lovastatin (MEVACOR) 20 MG tablet Take 1 tablet (20 mg total) by mouth at bedtime.   Melatonin 5 MG TABS Take 5 mg by mouth at bedtime.   memantine (NAMENDA) 10 MG tablet Take 10 mg by mouth daily.   Miconazole Nitrate 2 % POWD by Does not apply route. Apply to Redness in folds of skin as Needed   NON FORMULARY Diet type: Regular   potassium chloride (K-DUR,KLOR-CON) 10 MEQ tablet Take 10 mEq by mouth  daily.   sennosides-docusate sodium (SENOKOT-S) 8.6-50 MG tablet Take 1 tablet by mouth at bedtime. For Constipation   SYMBICORT 80-4.5 MCG/ACT inhaler Inhale 2 puffs into the lungs 2 (two) times daily.    valbenazine (INGREZZA) 40 MG capsule Take 40 mg by mouth at bedtime.   No facility-administered encounter medications on file as of 08/17/2021.     SIGNIFICANT DIAGNOSTIC EXAMS       ASSESSMENT/ PLAN:     Synthia Innocent NP [facility]  Contact [phone] Monday through Friday 8am- 5pm  After hours call [phone]   This encounter was created in error - please disregard.

## 2021-08-18 ENCOUNTER — Encounter (HOSPITAL_COMMUNITY)
Admission: RE | Admit: 2021-08-18 | Discharge: 2021-08-18 | Disposition: A | Discharge status: Home / Self Care | Payer: Medicare PPO | Source: Skilled Nursing Facility | Attending: Adult Health | Admitting: Adult Health

## 2021-08-18 DIAGNOSIS — I10 Essential (primary) hypertension: Secondary | ICD-10-CM | POA: Diagnosis present

## 2021-08-18 LAB — BASIC METABOLIC PANEL
Anion gap: 7 (ref 5–15)
BUN: 19 mg/dL (ref 8–23)
CO2: 32 mmol/L (ref 22–32)
Calcium: 8.2 mg/dL — ABNORMAL LOW (ref 8.9–10.3)
Chloride: 102 mmol/L (ref 98–111)
Creatinine, Ser: 0.67 mg/dL (ref 0.44–1.00)
GFR, Estimated: >60 mL/min (ref >60)
Glucose, Bld: 76 mg/dL (ref 70–99)
Potassium: 3.4 mmol/L — ABNORMAL LOW (ref 3.5–5.1)
Sodium: 141 mmol/L (ref 135–145)

## 2021-09-06 ENCOUNTER — Encounter: Payer: Self-pay | Admitting: Adult Health

## 2021-09-06 ENCOUNTER — Non-Acute Institutional Stay (SKILLED_NURSING_FACILITY): Payer: Medicare PPO | Admitting: Adult Health

## 2021-09-06 DIAGNOSIS — J410 Simple chronic bronchitis: Secondary | ICD-10-CM

## 2021-09-06 DIAGNOSIS — G20 Parkinson's disease: Secondary | ICD-10-CM

## 2021-09-06 DIAGNOSIS — D696 Thrombocytopenia, unspecified: Secondary | ICD-10-CM

## 2021-09-06 DIAGNOSIS — G2401 Drug induced subacute dyskinesia: Secondary | ICD-10-CM | POA: Diagnosis not present

## 2021-09-06 DIAGNOSIS — F03918 Unspecified dementia, unspecified severity, with other behavioral disturbance: Secondary | ICD-10-CM

## 2021-09-06 DIAGNOSIS — F429 Obsessive-compulsive disorder, unspecified: Secondary | ICD-10-CM

## 2021-09-06 DIAGNOSIS — I10 Essential (primary) hypertension: Secondary | ICD-10-CM | POA: Diagnosis not present

## 2021-09-06 DIAGNOSIS — G40909 Epilepsy, unspecified, not intractable, without status epilepticus: Secondary | ICD-10-CM

## 2021-09-06 DIAGNOSIS — G20A1 Parkinson's disease without dyskinesia, without mention of fluctuations: Secondary | ICD-10-CM

## 2021-09-06 NOTE — Progress Notes (Signed)
Provider:Evelise Reine, Chong Sicilian, NP  Location  [facility]  PCP: Sharee Holster, NP  Extended Emergency Contact Information Primary Emergency Contact: Olivera,Alfreda Address: [address]          [name], [place] [address] [place] Home Phone: [phone] Mobile Phone: [phone] Relation: Daughter Secondary Emergency Contact: Vivien Presto [place] of [place] Mobile Phone: [phone] Relation: Son  Codes status:Full Code  Goals of care: advanced directive information Advanced Directives 09/06/2021  Does Patient Have a Medical Advance Directive? No  Type of Advance Directive -  Does patient want to make changes to medical advance directive? No - Patient declined  Copy of Healthcare Power of Attorney in Chart? -  Would patient like information on creating a medical advance directive? -  Pre-existing out of facility DNR order (yellow form or pink MOST form) -     No Known Allergies  Chief Complaint  Patient presents with   Annual Exam    HPI  She is a 87 year old long term resident of this facility being seen for her annual exam. She has not required any hospitalizations or ED visits. She has not had any recent falls. There are no reports of uncontrolled pain. There are no reports of changes in appetite or significant weight loss/gain. She continues to be followed for her chronic illnesses including:  Seizure disorder: Thrombocytopenia: Essential hypertension:. Late onset alzheimer's disease without behavioral disturbance:    Past Medical History:  Diagnosis Date   Diverticulosis OCT 2010 TCS   SIGMOID COLON   Esophageal web 2010   DILATION 16 MM   Gastritis, Helicobacter pylori 2010   Rx. ABO for 10 days    GERD (gastroesophageal reflux disease)    Hip fracture, left (HCC)    2016   Hyperlipidemia    Hypertension    Restless legs syndrome    Seizures (HCC) January 2010   Post head trauma in fall .sees Dr Gerilyn Pilgrim    Past Surgical History:  Procedure Laterality Date   ABDOMINAL HYSTERECTOMY     COLONOSCOPY  OCT 2010   TORTUOUS, Sml IH, Tics, MULTIPLE SIMPLE ADENOMAS (<6MM)   HIP ARTHROPLASTY Left 11/19/2014   Procedure: LEFT PARTIAL HIP REPLACEMENT;  Surgeon: Vickki Hearing, MD;  Location: [facility];  Service: Orthopedics;  Laterality: Left;   ORIF HIP FRACTURE  04/05/2012   Procedure: OPEN REDUCTION INTERNAL FIXATION HIP;  Surgeon: Vickki Hearing, MD;  Location: [facility];  Service: Orthopedics;  Laterality: Right;   UPPER GASTROINTESTINAL ENDOSCOPY  AUG 2010   SAVARY   VESICOVAGINAL FISTULA CLOSURE W/ TAH  1966   bleeding     reports that she has never smoked. She has never used smokeless tobacco. She reports that she does not drink alcohol and does not use drugs. Social History   Tobacco Use   Smoking status: Never   Smokeless tobacco: Never  Vaping Use   Vaping Use: Never used  Substance Use Topics   Alcohol use: No   Drug use: No   Family History  Problem Relation Age of Onset   Cancer Mother        bladder    Diabetes Mother    Diabetes Father    Emphysema Father    GER disease Brother    Colon cancer Neg Hx    Colon polyps Neg Hx     Pertinent  Health Maintenance Due  Topic Date Due   INFLUENZA VACCINE  Completed   DEXA  SCAN  Completed   Fall Risk 04/29/2014 07/04/2017 07/14/2018 01/22/2020 01/24/2021  Falls in the past year? Yes No No 0 1  Was there an injury with Fall? No - - - 0  Fall Risk Category Calculator - - - - 1  Fall Risk Category - - - - Low  Patient Fall Risk Level - - - Low fall risk Low fall risk  Patient at Risk for Falls Due to - - - - History of fall(s);Impaired balance/gait;Impaired mobility  Fall risk Follow up - - - - Falls evaluation completed   Depression screen [facility] 2/9 01/24/2021 01/22/2020 07/14/2018 07/04/2017 04/29/2014  Decreased Interest 0 0 0 1 1  Down, Depressed, Hopeless 0 0 1 3 1   PHQ - 2 Score 0 0 1 4 2   Altered sleeping 1 0 - 3 3  Tired,  decreased energy 1 0 - 3 2  Change in appetite 0 0 - 0 0  Feeling bad or failure about yourself  0 0 - 0 1  Trouble concentrating 0 0 - 1 3  Moving slowly or fidgety/restless 0 0 - 0 1  Suicidal thoughts 0 0 - 0 0  PHQ-9 Score 2 0 - 11 12  Difficult doing work/chores Not difficult at all - - Somewhat difficult -  Some recent data might be hidden    Functional Status Survey:    Outpatient Encounter Medications as of 09/06/2021  Medication Sig   acetaminophen (TYLENOL) 650 MG CR tablet Take 650 mg by mouth 2 (two) times daily.   aspirin 81 MG chewable tablet Chew 81 mg by mouth daily.   Balsam [place]-Castor Oil [facility]) OINT Special Instructions: Apply to sacrum, coccyx and bilateral buttocks qshift for prevention.   calcium carbonate (TUMS - DOSED IN MG ELEMENTAL CALCIUM) 500 MG chewable tablet Chew 1 tablet by mouth 3 (three) times daily. DX age related osteoporosis without current pathological fracture 07/10/2018 per Dr.Gupta note   Carbidopa-Levodopa ER (SINEMET CR) 25-100 MG tablet controlled release Take 1 tablet by mouth 3 (three) times daily.   FLUoxetine (PROZAC) 40 MG capsule Take 40 mg by mouth daily.   fluticasone-salmeterol (WIXELA INHUB) 100-50 MCG/ACT AEPB Inhale 1 puff into the lungs 2 (two) times daily. Special Instructions: RINSE MOUTH AFTER USE. DISCARD AFTER 1 MONTH   hydrochlorothiazide (HYDRODIURIL) 12.5 MG tablet Take 12.5 mg by mouth daily.   levETIRAcetam (KEPPRA) 500 MG tablet TAKE 1 TABLET BY MOUTH TWICE A DAY AS DIRECTED.   lovastatin (MEVACOR) 20 MG tablet Take 1 tablet (20 mg total) by mouth at bedtime.   Melatonin 5 MG TABS Take 5 mg by mouth at bedtime.   memantine (NAMENDA) 10 MG tablet Take 10 mg by mouth daily.   Miconazole Nitrate 2 % POWD by Does not apply route. Apply to Redness in folds of skin as Needed   NON FORMULARY Diet type: Regular   potassium chloride (K-DUR,KLOR-CON) 10 MEQ tablet Take 10 mEq by mouth daily.   sennosides-docusate sodium  (SENOKOT-S) 8.6-50 MG tablet Take 1 tablet by mouth at bedtime. For Constipation   valbenazine (INGREZZA) 40 MG capsule Take 40 mg by mouth at bedtime.   [DISCONTINUED] SYMBICORT 80-4.5 MCG/ACT inhaler Inhale 2 puffs into the lungs 2 (two) times daily.    No facility-administered encounter medications on file as of 09/06/2021.     Vitals:   09/06/21 0946  BP: (!) 131/92  Pulse: 73  Resp: 20  Temp: (!) 97.2 F (36.2 C)  SpO2: 92%  Weight:  243 lb 9.6 oz (110.5 kg)  Height: 5\' 7"  (1.702 m)   Body mass index is 38.15 kg/m.  DIAGNOSTIC EXAMS    PREVIOUS  09-07-19: DEXA: t score -1.06  NO NEW EXAMS.   LABS REVIEWED PREVIOUS  01-23-21 wbc 4.9; hgb 11.5;hct 36.8; mcv 94.4 plt 162; glucose 89; bun 19; creat 0.74; k+ 3.6; na++ 141; ca 8.5 GFR>60 liver normal albumin 3.4; chol 130; ldl 89; trig 61; hdl 42  5-11,13,15-22: guaiac negative  05-30-21: wbc 4.7; hgb 13.2; hct 40.5; mcv 92.3 plt 156;glucose119; bun 22; creat 0.71; k+ 3.6; na++ 138; ca 8.2 GFR>60 urine culture no growth 08-18-21: glucose 76; bun 19; creat 0.67; k+ 3.4; na++ 141; ca 8.2 GFR>60  NO NEW LABS.      Review of Systems  Constitutional:  Negative for malaise/fatigue.  Respiratory:  Negative for cough and shortness of breath.   Cardiovascular:  Negative for chest pain, palpitations and leg swelling.  Gastrointestinal:  Negative for abdominal pain, constipation and heartburn.  Musculoskeletal:  Negative for back pain, joint pain and myalgias.  Skin: Negative.   Neurological:  Negative for dizziness.  Psychiatric/Behavioral:  The patient is not nervous/anxious.     Physical Exam Constitutional:      General: She is not in acute distress.    Appearance: She is well-developed. She is not diaphoretic.  HENT:     Nose: Nose normal.     Mouth/Throat:     Mouth: Mucous membranes are moist.     Pharynx: Oropharynx is clear.  Eyes:     Conjunctiva/sclera: Conjunctivae normal.  Neck:     Thyroid: No  thyromegaly.  Cardiovascular:     Rate and Rhythm: Normal rate and regular rhythm.     Pulses: Normal pulses.     Heart sounds: Normal heart sounds.  Pulmonary:     Effort: Pulmonary effort is normal. No respiratory distress.     Breath sounds: Normal breath sounds.  Abdominal:     General: Bowel sounds are normal. There is no distension.     Palpations: Abdomen is soft.     Tenderness: There is no abdominal tenderness.  Musculoskeletal:     Cervical back: Neck supple.     Right lower leg: No edema.     Left lower leg: No edema.     Comments:  Is able to move all extremities 2016: left hip fracture left hip replacement    Lymphadenopathy:     Cervical: No cervical adenopathy.  Skin:    General: Skin is warm and dry.  Neurological:     Mental Status: She is alert. Mental status is at baseline.  Psychiatric:        Mood and Affect: Mood normal.      ASSESSMENT/ PLAN:  TODAY:   Parkinson's disease: is stable will continue sinemet cr 25/100 mg three times daily   2. Tardive dyskinesia: is without change: has recently been started on ingrezza 40 mg daily   3. Chronic generalized pain: is stable will continue tylenol 650 mg twice daily  4. Seizure disorder: (after CHI) no reports of seizure activity will continue keppra 500 mg twice daily   5. Thrombocytopenia: stable plt 142 will monitor  6. Essential hypertension: is stable b/p 120/70 will continue hctz 12.5 mg daily   7. Late onset alzheimer's disease without behavioral disturbance: is stable weight is 252 pounds; will continue namenda 5 mg twice daily   8. Simple chronic bronchitis: is stable without recent flares will  continue symbicort 80/4.5 mcg 2 puffs twice daily   9. Chronic constipation: is stable will continue senna s daily    10. Hypokalemia: is stable k+ 3.6 will continue k+ 10 meq daily   11. Unspecified hyperlipidemia: is stable LDL 89 will continue mevacor 20 mg daily   12. Anemia: guaiac negative: hgb  11.5  13. Morbid obesity: BMI 38.15 will monitor  14. Unspecified obsessive compulsive disorder: is stable will continue prozac 40 mg daily (has failed 2 weans) melatonin 5 mg nightly       Synthia Innocent NP [facility]  Contact [phone] Monday through Friday 8am- 5pm  After hours call [phone]

## 2021-09-07 ENCOUNTER — Encounter: Payer: Self-pay | Admitting: Adult Health

## 2021-10-05 ENCOUNTER — Non-Acute Institutional Stay (SKILLED_NURSING_FACILITY): Payer: Medicare PPO | Admitting: Adult Health

## 2021-10-05 ENCOUNTER — Encounter: Payer: Self-pay | Admitting: Adult Health

## 2021-10-05 DIAGNOSIS — G8929 Other chronic pain: Secondary | ICD-10-CM

## 2021-10-05 DIAGNOSIS — R52 Pain, unspecified: Secondary | ICD-10-CM | POA: Diagnosis not present

## 2021-10-05 DIAGNOSIS — G20A1 Parkinson's disease without dyskinesia, without mention of fluctuations: Secondary | ICD-10-CM

## 2021-10-05 DIAGNOSIS — G20 Parkinson's disease: Secondary | ICD-10-CM

## 2021-10-05 DIAGNOSIS — G40909 Epilepsy, unspecified, not intractable, without status epilepticus: Secondary | ICD-10-CM

## 2021-10-05 DIAGNOSIS — G2401 Drug induced subacute dyskinesia: Secondary | ICD-10-CM | POA: Diagnosis not present

## 2021-10-05 NOTE — Progress Notes (Signed)
Location:  [facility]   Place of Service:   SNF   CODE STATUS: full code   No Known Allergies  Chief Complaint  Patient presents with   Medical Management of Chronic Issues             Parkinson's disease:  Tardive dyskinesia:  Chronic generalized pain: Seizure disorder:    HPI:  She is a 87 year old long term resident of this facility being seen for the management of her chronic illnesses: Parkinson's disease:  Tardive dyskinesia:  Chronic generalized pain: Seizure disorder:. There are no reports of uncontrolled pain. No reports of anxiety or depressive thoughts.   Past Medical History:  Diagnosis Date   Diverticulosis OCT 2010 TCS   SIGMOID COLON   Esophageal web 2010   DILATION 16 MM   Gastritis, Helicobacter pylori 2010   Rx. ABO for 10 days    GERD (gastroesophageal reflux disease)    Hip fracture, left (HCC)    2016   Hyperlipidemia    Hypertension    Restless legs syndrome    Seizures (HCC) January 2010   Post head trauma in fall .sees Dr Gerilyn Pilgrim    Past Surgical History:  Procedure Laterality Date   ABDOMINAL HYSTERECTOMY     COLONOSCOPY  OCT 2010   TORTUOUS, Sml IH, Tics, MULTIPLE SIMPLE ADENOMAS (<6MM)   HIP ARTHROPLASTY Left 11/19/2014   Procedure: LEFT PARTIAL HIP REPLACEMENT;  Surgeon: Vickki Hearing, MD;  Location: [facility];  Service: Orthopedics;  Laterality: Left;   ORIF HIP FRACTURE  04/05/2012   Procedure: OPEN REDUCTION INTERNAL FIXATION HIP;  Surgeon: Vickki Hearing, MD;  Location: [facility];  Service: Orthopedics;  Laterality: Right;   UPPER GASTROINTESTINAL ENDOSCOPY  AUG 2010   SAVARY   VESICOVAGINAL FISTULA CLOSURE W/ TAH  1966   bleeding     Social History   Socioeconomic History   Marital status: Widowed    Spouse name: Not on file   Number of children: Not on file   Years of education: Not on file   Highest education level: Not on file  Occupational History   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 agent     Occupation: retired   Tobacco Use   Smoking status: Never   Smokeless tobacco: Never  Building services engineer Use: Never used  Substance and Sexual Activity   Alcohol use: No   Drug use: No   Sexual activity: Not Currently  Other Topics Concern   Not on file  Social History Narrative   Long term resident of [facility]    Social Determinants of Health   Financial Resource Strain: Not on file  Food Insecurity: Not on file  Transportation Needs: Not on file  Physical Activity: Not on file  Stress: Not on file  Social Connections: Not on file  Intimate Partner Violence: Not on file   Family History  Problem Relation Age of Onset   Cancer Mother        bladder    Diabetes Mother    Diabetes Father    Emphysema Father    GER disease Brother    Colon cancer Neg Hx    Colon polyps Neg Hx       VITAL SIGNS BP 120/61    Pulse 72    Temp 98.4 F (36.9 C)    Ht 5\' 7"  (1.702 m)    Wt 240 lb 9.6 oz (109.1 kg)    BMI 37.68 kg/m  Outpatient Encounter Medications as of 10/05/2021  Medication Sig   acetaminophen (TYLENOL) 650 MG CR tablet Take 650 mg by mouth 2 (two) times daily.   aspirin 81 MG chewable tablet Chew 81 mg by mouth daily.   Balsam [place]-Castor Oil [facility]) OINT Special Instructions: Apply to sacrum, coccyx and bilateral buttocks qshift for prevention.   calcium carbonate (TUMS - DOSED IN MG ELEMENTAL CALCIUM) 500 MG chewable tablet Chew 1 tablet by mouth 3 (three) times daily. DX age related osteoporosis without current pathological fracture 07/10/2018 per Dr.Gupta note   Carbidopa-Levodopa ER (SINEMET CR) 25-100 MG tablet controlled release Take 1 tablet by mouth 3 (three) times daily.   FLUoxetine (PROZAC) 40 MG capsule Take 40 mg by mouth daily.   fluticasone-salmeterol (WIXELA INHUB) 100-50 MCG/ACT AEPB Inhale 1 puff into the lungs 2 (two) times daily. Special Instructions: RINSE MOUTH AFTER USE. DISCARD AFTER 1 MONTH   hydrochlorothiazide (HYDRODIURIL) 12.5 MG tablet  Take 12.5 mg by mouth daily.   levETIRAcetam (KEPPRA) 500 MG tablet TAKE 1 TABLET BY MOUTH TWICE A DAY AS DIRECTED.   lovastatin (MEVACOR) 20 MG tablet Take 1 tablet (20 mg total) by mouth at bedtime.   Melatonin 5 MG TABS Take 5 mg by mouth at bedtime.   memantine (NAMENDA) 10 MG tablet Take 10 mg by mouth daily.   Miconazole Nitrate 2 % POWD by Does not apply route. Apply to Redness in folds of skin as Needed   NON FORMULARY Diet type: Regular   potassium chloride (K-DUR,KLOR-CON) 10 MEQ tablet Take 10 mEq by mouth daily.   sennosides-docusate sodium (SENOKOT-S) 8.6-50 MG tablet Take 1 tablet by mouth at bedtime. For Constipation   valbenazine (INGREZZA) 40 MG capsule Take 40 mg by mouth at bedtime.   No facility-administered encounter medications on file as of 10/05/2021.     SIGNIFICANT DIAGNOSTIC EXAMS  PREVIOUS  09-07-19: DEXA: t score -1.06  NO NEW EXAMS.   LABS REVIEWED PREVIOUS  01-23-21 wbc 4.9; hgb 11.5;hct 36.8; mcv 94.4 plt 162; glucose 89; bun 19; creat 0.74; k+ 3.6; na++ 141; ca 8.5 GFR>60 liver normal albumin 3.4; chol 130; ldl 89; trig 61; hdl 42  5-11,13,15-22: guaiac negative  05-30-21: wbc 4.7; hgb 13.2; hct 40.5; mcv 92.3 plt 156;glucose119; bun 22; creat 0.71; k+ 3.6; na++ 138; ca 8.2 GFR>60 urine culture no growth 08-18-21: glucose 76; bun 19; creat 0.67; k+ 3.4; na++ 141; ca 8.2 GFR>60  NO NEW LABS.     Review of Systems  Constitutional:  Negative for malaise/fatigue.  Respiratory:  Negative for cough and shortness of breath.   Cardiovascular:  Negative for chest pain, palpitations and leg swelling.  Gastrointestinal:  Negative for abdominal pain, constipation and heartburn.  Musculoskeletal:  Negative for back pain, joint pain and myalgias.  Skin: Negative.   Neurological:  Negative for dizziness.  Psychiatric/Behavioral:  The patient is not nervous/anxious.      Physical Exam Constitutional:      General: She is not in acute distress.     Appearance: She is well-developed. She is obese. She is not diaphoretic.  Neck:     Thyroid: No thyromegaly.  Cardiovascular:     Rate and Rhythm: Normal rate and regular rhythm.     Pulses: Normal pulses.     Heart sounds: Normal heart sounds.  Pulmonary:     Effort: Pulmonary effort is normal. No respiratory distress.     Breath sounds: Normal breath sounds.  Abdominal:     General:  Bowel sounds are normal. There is no distension.     Palpations: Abdomen is soft.     Tenderness: There is no abdominal tenderness.  Musculoskeletal:     Cervical back: Neck supple.     Right lower leg: No edema.     Left lower leg: No edema.     Comments:  Is able to move all extremities 2016: left hip fracture left hip   Lymphadenopathy:     Cervical: No cervical adenopathy.  Skin:    General: Skin is warm and dry.  Neurological:     Mental Status: She is alert. Mental status is at baseline.  Psychiatric:        Mood and Affect: Mood normal.      ASSESSMENT/ PLAN:  TODAY:   Parkinson's disease: is stable will continue sinemet cr 25/100 mg three times daily   2. Tardive dyskinesia: is without change will continue ingrezza 40 mg daily   3. Chronic generalized pain: is stable will continue tylenol 650 mg twice daily   4. Seizure disorder: (after CHI) no reports of seizure activity; will continue keppra 500 mg twice daily     PREVIOUS   5. Thrombocytopenia: stable plt 142 will monitor  6. Essential hypertension: is stable b/p 120/70 will continue hctz 12.5 mg daily   7. Late onset alzheimer's disease without behavioral disturbance: is stable weight is 252 pounds; will continue namenda 5 mg twice daily   8. Simple chronic bronchitis: is stable without recent flares will continue symbicort 80/4.5 mcg 2 puffs twice daily   9. Chronic constipation: is stable will continue senna s daily    10. Hypokalemia: is stable k+ 3.6 will continue k+ 10 meq daily   11. Unspecified hyperlipidemia:  is stable LDL 89 will continue mevacor 20 mg daily   12. Anemia: guaiac negative: hgb 11.5  13. Morbid obesity: BMI 38.15 will monitor  14. Unspecified obsessive compulsive disorder: is stable will continue prozac 40 mg daily (has failed 2 weans) melatonin 5 mg nightly    Will check cbc; cmp lipids    Synthia Innocent NP [facility]  call [phone]

## 2021-10-09 ENCOUNTER — Other Ambulatory Visit (HOSPITAL_COMMUNITY)
Admission: RE | Admit: 2021-10-09 | Discharge: 2021-10-09 | Disposition: A | Discharge status: Home / Self Care | Payer: Medicare PPO | Source: Skilled Nursing Facility | Attending: Adult Health | Admitting: Adult Health

## 2021-10-09 DIAGNOSIS — I10 Essential (primary) hypertension: Secondary | ICD-10-CM | POA: Diagnosis present

## 2021-10-09 LAB — LIPID PANEL
Cholesterol: 133 mg/dL (ref 0–200)
HDL: 40 mg/dL — ABNORMAL LOW (ref >40)
LDL Cholesterol: 84 mg/dL (ref 0–99)
Total CHOL/HDL Ratio: 3.3 RATIO
Triglycerides: 44 mg/dL (ref <150)
VLDL: 9 mg/dL (ref 0–40)

## 2021-10-09 LAB — CBC
HCT: 38.1 % (ref 36.0–46.0)
Hemoglobin: 12.2 g/dL (ref 12.0–15.0)
MCH: 30 pg (ref 26.0–34.0)
MCHC: 32 g/dL (ref 30.0–36.0)
MCV: 93.6 fL (ref 80.0–100.0)
Platelets: 142 10*3/uL — ABNORMAL LOW (ref 150–400)
RBC: 4.07 MIL/uL (ref 3.87–5.11)
RDW: 14.7 % (ref 11.5–15.5)
WBC: 5.5 10*3/uL (ref 4.0–10.5)
nRBC: 0 % (ref 0.0–0.2)

## 2021-10-09 LAB — COMPREHENSIVE METABOLIC PANEL WITH GFR
ALT: 5 U/L (ref 0–44)
AST: 16 U/L (ref 15–41)
Albumin: 3.5 g/dL (ref 3.5–5.0)
Alkaline Phosphatase: 62 U/L (ref 38–126)
Anion gap: 8 (ref 5–15)
BUN: 27 mg/dL — ABNORMAL HIGH (ref 8–23)
CO2: 32 mmol/L (ref 22–32)
Calcium: 8.1 mg/dL — ABNORMAL LOW (ref 8.9–10.3)
Chloride: 103 mmol/L (ref 98–111)
Creatinine, Ser: 0.71 mg/dL (ref 0.44–1.00)
GFR, Estimated: >60 mL/min
Glucose, Bld: 72 mg/dL (ref 70–99)
Potassium: 3.4 mmol/L — ABNORMAL LOW (ref 3.5–5.1)
Sodium: 143 mmol/L (ref 135–145)
Total Bilirubin: 0.4 mg/dL (ref 0.3–1.2)
Total Protein: 6.3 g/dL — ABNORMAL LOW (ref 6.5–8.1)

## 2021-10-13 ENCOUNTER — Non-Acute Institutional Stay (SKILLED_NURSING_FACILITY): Payer: Medicare PPO | Admitting: Adult Health

## 2021-10-13 ENCOUNTER — Encounter: Payer: Self-pay | Admitting: Adult Health

## 2021-10-13 DIAGNOSIS — J410 Simple chronic bronchitis: Secondary | ICD-10-CM | POA: Diagnosis not present

## 2021-10-13 DIAGNOSIS — F429 Obsessive-compulsive disorder, unspecified: Secondary | ICD-10-CM | POA: Diagnosis not present

## 2021-10-13 DIAGNOSIS — G20 Parkinson's disease: Secondary | ICD-10-CM | POA: Diagnosis not present

## 2021-10-13 DIAGNOSIS — D696 Thrombocytopenia, unspecified: Secondary | ICD-10-CM | POA: Diagnosis not present

## 2021-10-13 NOTE — Progress Notes (Signed)
Location:  [facility] Room Number: 116-D Place of Service:  SNF (31)   CODE STATUS: Full Code  No Known Allergies  Chief Complaint  Patient presents with   Acute Visit    Care plan meeting    HPI:  We have come together for her care plan meeting. BIMS 6/15 mood 2/30: reports frequently tired. She is nonambulatory; she propels herself in w/c. She requires extensive to dependent for her adls care. She is frequently incontinent of bladder and bowel. She has had one fall without injury. Dietary: regular diet; appetite is fair. Therapy: bed mobility; supervision; stand/pivot: min assist; working on stretching balance strengthening . She continues to be followed for her chronic illnesses including: Simple chronic bronchitis  Thrombocytopenia   Parkinson's disease  Obsessive compulsive disordered unspecified type   Past Medical History:  Diagnosis Date   Diverticulosis OCT 2010 TCS   SIGMOID COLON   Esophageal web 2010   DILATION 16 MM   Gastritis, Helicobacter pylori 2010   Rx. ABO for 10 days    GERD (gastroesophageal reflux disease)    Hip fracture, left (HCC)    2016   Hyperlipidemia    Hypertension    Restless legs syndrome    Seizures (HCC) January 2010   Post head trauma in fall .sees Dr Gerilyn Pilgrim    Past Surgical History:  Procedure Laterality Date   ABDOMINAL HYSTERECTOMY     COLONOSCOPY  OCT 2010   TORTUOUS, Sml IH, Tics, MULTIPLE SIMPLE ADENOMAS (<6MM)   HIP ARTHROPLASTY Left 11/19/2014   Procedure: LEFT PARTIAL HIP REPLACEMENT;  Surgeon: Vickki Hearing, MD;  Location: [facility];  Service: Orthopedics;  Laterality: Left;   ORIF HIP FRACTURE  04/05/2012   Procedure: OPEN REDUCTION INTERNAL FIXATION HIP;  Surgeon: Vickki Hearing, MD;  Location: [facility];  Service: Orthopedics;  Laterality: Right;   UPPER GASTROINTESTINAL ENDOSCOPY  AUG 2010   SAVARY   VESICOVAGINAL FISTULA CLOSURE W/ TAH  1966   bleeding     Social History    Socioeconomic History   Marital status: Widowed    Spouse name: Not on file   Number of children: Not on file   Years of education: Not on file   Highest education level: Not on file  Occupational History   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 agent    Occupation: retired   Tobacco Use   Smoking status: Never   Smokeless tobacco: Never  Building services engineer Use: Never used  Substance and Sexual Activity   Alcohol use: No   Drug use: No   Sexual activity: Not Currently  Other Topics Concern   Not on file  Social History Narrative   Long term resident of [facility]    Social Determinants of Health   Financial Resource Strain: Not on file  Food Insecurity: Not on file  Transportation Needs: Not on file  Physical Activity: Not on file  Stress: Not on file  Social Connections: Not on file  Intimate Partner Violence: Not on file   Family History  Problem Relation Age of Onset   Cancer Mother        bladder    Diabetes Mother    Diabetes Father    Emphysema Father    GER disease Brother    Colon cancer Neg Hx    Colon polyps Neg Hx       VITAL SIGNS BP 139/70    Pulse 66    Temp 98.4  F (36.9 C)    Resp 20    Ht 5\' 7"  (1.702 m)    Wt 241 lb (109.3 kg)    SpO2 92%    BMI 37.75 kg/m   Outpatient Encounter Medications as of 10/13/2021  Medication Sig   acetaminophen (TYLENOL) 650 MG CR tablet Take 650 mg by mouth 2 (two) times daily.   aspirin 81 MG chewable tablet Chew 81 mg by mouth daily.   Balsam [place]-Castor Oil [facility](VENELEX) OINT Special Instructions: Apply to sacrum, coccyx and bilateral buttocks qshift for prevention.   calcium carbonate (TUMS - DOSED IN MG ELEMENTAL CALCIUM) 500 MG chewable tablet Chew 1 tablet by mouth 3 (three) times daily. DX age related osteoporosis without current pathological fracture 07/10/2018 per Dr.Gupta note   Carbidopa-Levodopa ER (SINEMET CR) 25-100 MG tablet controlled release Take 1 tablet by mouth 3 (three) times daily.    FLUoxetine (PROZAC) 40 MG capsule Take 40 mg by mouth daily.   fluticasone-salmeterol (ADVAIR) 100-50 MCG/ACT AEPB Inhale 1 puff into the lungs 2 (two) times daily. Special Instructions: RINSE MOUTH AFTER USE. DISCARD AFTER 1 MONTH   hydrochlorothiazide (HYDRODIURIL) 12.5 MG tablet Take 12.5 mg by mouth daily.   levETIRAcetam (KEPPRA) 500 MG tablet TAKE 1 TABLET BY MOUTH TWICE A DAY AS DIRECTED.   lovastatin (MEVACOR) 20 MG tablet Take 1 tablet (20 mg total) by mouth at bedtime.   Melatonin 5 MG TABS Take 5 mg by mouth at bedtime.   memantine (NAMENDA) 10 MG tablet Take 10 mg by mouth daily.   Miconazole Nitrate 2 % POWD by Does not apply route. Apply to Redness in folds of skin as Needed   NON FORMULARY Diet type: Regular   potassium chloride (K-DUR,KLOR-CON) 10 MEQ tablet Take 10 mEq by mouth daily.   sennosides-docusate sodium (SENOKOT-S) 8.6-50 MG tablet Take 1 tablet by mouth at bedtime. For Constipation   valbenazine (INGREZZA) 40 MG capsule Take 40 mg by mouth at bedtime.   No facility-administered encounter medications on file as of 10/13/2021.     SIGNIFICANT DIAGNOSTIC EXAMS   PREVIOUS  09-07-19: DEXA: t score -1.06  NO NEW EXAMS.   LABS REVIEWED PREVIOUS  01-23-21 wbc 4.9; hgb 11.5;hct 36.8; mcv 94.4 plt 162; glucose 89; bun 19; creat 0.74; k+ 3.6; na++ 141; ca 8.5 GFR>60 liver normal albumin 3.4; chol 130; ldl 89; trig 61; hdl 42  5-11,13,15-22: guaiac negative  05-30-21: wbc 4.7; hgb 13.2; hct 40.5; mcv 92.3 plt 156;glucose119; bun 22; creat 0.71; k+ 3.6; na++ 138; ca 8.2 GFR>60 urine culture no growth 08-18-21: glucose 76; bun 19; creat 0.67; k+ 3.4; na++ 141; ca 8.2 GFR>60  NO NEW LABS.   Review of Systems  Constitutional:  Negative for malaise/fatigue.  Respiratory:  Negative for cough and shortness of breath.   Cardiovascular:  Negative for chest pain, palpitations and leg swelling.  Gastrointestinal:  Negative for abdominal pain, constipation and heartburn.   Musculoskeletal:  Negative for back pain, joint pain and myalgias.  Skin: Negative.   Neurological:  Negative for dizziness.  Psychiatric/Behavioral:  The patient is not nervous/anxious.    Physical Exam Constitutional:      General: She is not in acute distress.    Appearance: She is well-developed. She is obese. She is not diaphoretic.  Neck:     Thyroid: No thyromegaly.  Cardiovascular:     Rate and Rhythm: Normal rate and regular rhythm.     Pulses: Normal pulses.     Heart sounds:  Normal heart sounds.  Pulmonary:     Effort: Pulmonary effort is normal. No respiratory distress.     Breath sounds: Normal breath sounds.  Abdominal:     General: Bowel sounds are normal. There is no distension.     Palpations: Abdomen is soft.     Tenderness: There is no abdominal tenderness.  Musculoskeletal:     Cervical back: Neck supple.     Right lower leg: No edema.     Left lower leg: No edema.     Comments:  Is able to move all extremities 2016: left hip fracture left hip    Lymphadenopathy:     Cervical: No cervical adenopathy.  Skin:    General: Skin is warm and dry.  Neurological:     Mental Status: She is alert. Mental status is at baseline.  Psychiatric:        Mood and Affect: Mood normal.     ASSESSMENT/ PLAN:  TODAY  Simple chronic bronchitis Thrombocytopenia Parkinson's disease Obsessive compulsive disordered unspecified type   Will continue current medications Will continue current plan of care Will continue to monitor her status.   Time spent with patient: 40 minutes: medications therapy plan of care    Synthia Innocent NP [facility]   call [phone]

## 2021-11-06 ENCOUNTER — Non-Acute Institutional Stay (SKILLED_NURSING_FACILITY): Payer: Medicare PPO | Admitting: Adult Health

## 2021-11-06 ENCOUNTER — Encounter: Payer: Self-pay | Admitting: Adult Health

## 2021-11-06 DIAGNOSIS — J410 Simple chronic bronchitis: Secondary | ICD-10-CM

## 2021-11-06 DIAGNOSIS — I10 Essential (primary) hypertension: Secondary | ICD-10-CM | POA: Diagnosis not present

## 2021-11-06 DIAGNOSIS — D696 Thrombocytopenia, unspecified: Secondary | ICD-10-CM | POA: Diagnosis not present

## 2021-11-06 DIAGNOSIS — F03918 Unspecified dementia, unspecified severity, with other behavioral disturbance: Secondary | ICD-10-CM | POA: Diagnosis not present

## 2021-11-06 NOTE — Progress Notes (Signed)
Location:  [facility] Room Number: 116-D Place of Service:  SNF (31)   CODE STATUS: Full Code  No Known Allergies  Chief Complaint  Patient presents with   Medical Management of Chronic Issues          Thrombocytopenia:  Essential hypertension: Late onset alzheimer's disease without behavioral disturbance:  Simple chronic bronchitis     HPI:  She is a 88 year old long term resident of this facility being seen for the management of her chronic illnesses: Thrombocytopenia:  Essential hypertension: Late onset alzheimer's disease without behavioral disturbance:  Simple chronic bronchitis there are no reports of uncontrolled pain. No reports of cough or shortness present. No reports of anxiety or agitation.   Past Medical History:  Diagnosis Date   Diverticulosis OCT 2010 TCS   SIGMOID COLON   Esophageal web 2010   DILATION 16 MM   Gastritis, Helicobacter pylori 2010   Rx. ABO for 10 days    GERD (gastroesophageal reflux disease)    Hip fracture, left (HCC)    2016   Hyperlipidemia    Hypertension    Restless legs syndrome    Seizures (HCC) January 2010   Post head trauma in fall .sees Dr Gerilyn Pilgrim    Past Surgical History:  Procedure Laterality Date   ABDOMINAL HYSTERECTOMY     COLONOSCOPY  OCT 2010   TORTUOUS, Sml IH, Tics, MULTIPLE SIMPLE ADENOMAS (<6MM)   HIP ARTHROPLASTY Left 11/19/2014   Procedure: LEFT PARTIAL HIP REPLACEMENT;  Surgeon: Vickki Hearing, MD;  Location: [facility];  Service: Orthopedics;  Laterality: Left;   ORIF HIP FRACTURE  04/05/2012   Procedure: OPEN REDUCTION INTERNAL FIXATION HIP;  Surgeon: Vickki Hearing, MD;  Location: [facility];  Service: Orthopedics;  Laterality: Right;   UPPER GASTROINTESTINAL ENDOSCOPY  AUG 2010   SAVARY   VESICOVAGINAL FISTULA CLOSURE W/ TAH  1966   bleeding     Social History   Socioeconomic History   Marital status: Widowed    Spouse name: Not on file   Number of children: Not on file    Years of education: Not on file   Highest education level: Not on file  Occupational History   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 agent    Occupation: retired   Tobacco Use   Smoking status: Never   Smokeless tobacco: Never  Building services engineer Use: Never used  Substance and Sexual Activity   Alcohol use: No   Drug use: No   Sexual activity: Not Currently  Other Topics Concern   Not on file  Social History Narrative   Long term resident of [facility]    Social Determinants of Health   Financial Resource Strain: Not on file  Food Insecurity: Not on file  Transportation Needs: Not on file  Physical Activity: Not on file  Stress: Not on file  Social Connections: Not on file  Intimate Partner Violence: Not on file   Family History  Problem Relation Age of Onset   Cancer Mother        bladder    Diabetes Mother    Diabetes Father    Emphysema Father    GER disease Brother    Colon cancer Neg Hx    Colon polyps Neg Hx       VITAL SIGNS BP (!) 115/58    Pulse (!) 52    Temp 98.4 F (36.9 C)    Resp 20    Ht  5\' 7"  (1.702 m)    Wt 241 lb (109.3 kg)    SpO2 92%    BMI 37.75 kg/m   Outpatient Encounter Medications as of 11/06/2021  Medication Sig   acetaminophen (TYLENOL) 650 MG CR tablet Take 650 mg by mouth 2 (two) times daily.   aspirin 81 MG chewable tablet Chew 81 mg by mouth daily.   Balsam [place]-Castor Oil [facility](VENELEX) OINT Special Instructions: Apply to sacrum, coccyx and bilateral buttocks qshift for prevention.   calcium carbonate (TUMS - DOSED IN MG ELEMENTAL CALCIUM) 500 MG chewable tablet Chew 1 tablet by mouth 3 (three) times daily. DX age related osteoporosis without current pathological fracture 07/10/2018 per Dr.Gupta note   Carbidopa-Levodopa ER (SINEMET CR) 25-100 MG tablet controlled release Take 1 tablet by mouth 3 (three) times daily.   FLUoxetine (PROZAC) 40 MG capsule Take 40 mg by mouth daily.   fluticasone-salmeterol (ADVAIR) 100-50 MCG/ACT  AEPB Inhale 1 puff into the lungs 2 (two) times daily. Special Instructions: RINSE MOUTH AFTER USE. DISCARD AFTER 1 MONTH   hydrochlorothiazide (HYDRODIURIL) 12.5 MG tablet Take 12.5 mg by mouth daily.   levETIRAcetam (KEPPRA) 500 MG tablet TAKE 1 TABLET BY MOUTH TWICE A DAY AS DIRECTED.   lovastatin (MEVACOR) 20 MG tablet Take 1 tablet (20 mg total) by mouth at bedtime.   Melatonin 5 MG TABS Take 5 mg by mouth at bedtime.   memantine (NAMENDA) 10 MG tablet Take 10 mg by mouth 2 (two) times daily.   Miconazole Nitrate 2 % POWD by Does not apply route. Apply to Redness in folds of skin as Needed   NON FORMULARY Diet type: Regular   potassium chloride (K-DUR,KLOR-CON) 10 MEQ tablet Take 10 mEq by mouth daily.   sennosides-docusate sodium (SENOKOT-S) 8.6-50 MG tablet Take 1 tablet by mouth at bedtime. For Constipation   valbenazine (INGREZZA) 40 MG capsule Take 40 mg by mouth at bedtime.   No facility-administered encounter medications on file as of 11/06/2021.     SIGNIFICANT DIAGNOSTIC EXAMS  PREVIOUS  09-07-19: DEXA: t score -1.06  NO NEW EXAMS.   LABS REVIEWED PREVIOUS  01-23-21 wbc 4.9; hgb 11.5;hct 36.8; mcv 94.4 plt 162; glucose 89; bun 19; creat 0.74; k+ 3.6; na++ 141; ca 8.5 GFR>60 liver normal albumin 3.4; chol 130; ldl 89; trig 61; hdl 42  5-11,13,15-22: guaiac negative  05-30-21: wbc 4.7; hgb 13.2; hct 40.5; mcv 92.3 plt 156;glucose119; bun 22; creat 0.71; k+ 3.6; na++ 138; ca 8.2 GFR>60 urine culture no growth 08-18-21: glucose 76; bun 19; creat 0.67; k+ 3.4; na++ 141; ca 8.2 GFR>60  TODAY  10-09-21: wbc 5.5; hgb 12.2; hct 38.1; mcv 93.6 plt 142; glucose 72; bun 27; creat 0.71; k+ 3.4; na++ 143; ca 8.1; GFR>60; liver normal albumin 3.8; chol 133; ldl 84 trig 44; hdl 40      Review of Systems  Constitutional:  Negative for malaise/fatigue.  Respiratory:  Negative for cough and shortness of breath.   Cardiovascular:  Negative for chest pain, palpitations and leg swelling.   Gastrointestinal:  Negative for abdominal pain, constipation and heartburn.  Musculoskeletal:  Negative for back pain, joint pain and myalgias.  Skin: Negative.   Neurological:  Negative for dizziness.  Psychiatric/Behavioral:  The patient is not nervous/anxious.    Physical Exam Constitutional:      General: She is not in acute distress.    Appearance: She is well-developed. She is obese. She is not diaphoretic.  Neck:     Thyroid: No  thyromegaly.  Cardiovascular:     Rate and Rhythm: Normal rate and regular rhythm.     Pulses: Normal pulses.     Heart sounds: Normal heart sounds.  Pulmonary:     Effort: Pulmonary effort is normal. No respiratory distress.     Breath sounds: Normal breath sounds.  Abdominal:     General: Bowel sounds are normal. There is no distension.     Palpations: Abdomen is soft.     Tenderness: There is no abdominal tenderness.  Musculoskeletal:     Cervical back: Neck supple.     Right lower leg: No edema.     Left lower leg: No edema.     Comments: Is able to move all extremities 2016: left hip fracture left hip     Lymphadenopathy:     Cervical: No cervical adenopathy.  Skin:    General: Skin is warm and dry.  Neurological:     Mental Status: She is alert. Mental status is at baseline.  Psychiatric:        Mood and Affect: Mood normal.        ASSESSMENT/ PLAN:  TODAY:   Thrombocytopenia: stable plt 142; will monitor  2. Essential hypertension: is stable 115/58 will continue hctz 12.5 mg daily   3. Late onset alzheimer's disease without behavioral disturbance: is stable weight is 241 pounds; will continue namenda 5 mg twice daily  4. Simple chronic bronchitis: is stable without recent flares: will continue advair 150/50 1 puff twice daily    PREVIOUS    5. Chronic constipation: is stable will continue senna s daily    6. Hypokalemia: is stable k+ 3.6 will continue k+ 10 meq daily   7. Unspecified hyperlipidemia: is stable LDL 89  will continue mevacor 20 mg daily   8. Anemia: guaiac negative: hgb 11.5  9. Morbid obesity: BMI 38.15 will monitor  10. Unspecified obsessive compulsive disorder: is stable will continue prozac 40 mg daily (has failed 2 weans) melatonin 5 mg nightly   11. Parkinson's disease: is stable will continue sinemet cr 25/100 mg three times daily   12. Tardive dyskinesia: is without change will continue ingrezza 40 mg daily   13. Chronic generalized pain: is stable will continue tylenol 650 mg twice daily   14. Seizure disorder: (after CHI) no reports of seizure activity; will continue keppra 500 mg twice daily     Synthia Innocent NP [facility]   call [phone]

## 2021-11-21 ENCOUNTER — Non-Acute Institutional Stay (SKILLED_NURSING_FACILITY): Payer: Medicare PPO | Admitting: Internal Medicine

## 2021-11-21 ENCOUNTER — Encounter: Payer: Self-pay | Admitting: Internal Medicine

## 2021-11-21 DIAGNOSIS — G40909 Epilepsy, unspecified, not intractable, without status epilepticus: Secondary | ICD-10-CM | POA: Diagnosis not present

## 2021-11-21 DIAGNOSIS — D696 Thrombocytopenia, unspecified: Secondary | ICD-10-CM

## 2021-11-21 DIAGNOSIS — E441 Mild protein-calorie malnutrition: Secondary | ICD-10-CM

## 2021-11-21 NOTE — Progress Notes (Signed)
° °  NURSING HOME LOCATION:  [facility] ROOM NUMBER: 116 D  CODE STATUS: Full code  PCP: Synthia Innocent, NP  This is a nursing facility follow up visit of chronic medical diagnoses & to document compliance with Regulation 483.30 (c) in The Long Term Care Survey Manual Phase 2 which mandates caregiver visit ( visits can alternate among physician, PA or NP as per statutes) within 10 days of 30 days / 60 days/ 90 days post admission to SNF date    Interim medical record and care since last SNF visit was updated with review of diagnostic studies and change in clinical status since last visit were documented.  HPI: She is a permanent resident of the facility with medical diagnoses of history of Helicobacter pylori gastritis, GERD, esophageal web, history of diverticulosis, dyslipidemia, RLS, history of seizures, and essential hypertension.  Most recent labs were completed 10/09/2021.  Mild hypokalemia was present at 3.4 but was stable.  Slight prerenal azotemia was noted with a BUN of 27.  Hypocalcemia was present with a value of 8.1.  Creatinine was 0.71 with a GFR greater than 60 indicating CKD stage II.  Total protein was 6.3 suggesting protein/caloric malnutrition.  Albumin was low normal at 3.5.  LDL was 84.  CBC and differential was normal; but platelet count was mildly reduced at 142,000.  There is no current TSH on record.  The most recent value was 1.59 on 06/19/2017.  Review of systems: Dementia invalidated responses.  She can provide no history or follow commands and confabulates.  Physical exam:  Pertinent or positive findings: She is hard of hearing and responses are delayed and unfocused as documented.  Arcus senilis is present.  Lids are puffy.  She is edentulous and not wearing dentures.  Breath sounds are decreased and heart sounds are distant.  Central obesity is present with protuberant abdomen.  Pedal pulses are not palpable.  General appearance:no acute distress,  increased work of breathing is present.   Lymphatic: No lymphadenopathy about the head, neck, axilla. Eyes: No conjunctival inflammation or lid edema is present. There is no scleral icterus. Ears:  External ear exam shows no significant lesions or deformities.   Nose:  External nasal examination shows no deformity or inflammation. Nasal mucosa are pink and moist without lesions, exudates Oral exam:  Lips and gums are healthy appearing. There is no oropharyngeal erythema or exudate. Neck:  No thyromegaly, masses, tenderness noted.    Heart:  No gallop, click, or rub. Lungs: without wheezes, rhonchi, rales, rubs. Abdomen: Bowel sounds are normal. Abdomen is soft and nontender with no organomegaly, hernias, masses. GU: Deferred  Extremities:  No cyanosis Neurologic exam :Balance, Rhomberg, finger to nose testing could not be completed due to clinical state Skin: Warm & dry w/o tenting. No significant rash.  See summary under each active problem in the Problem List with associated updated therapeutic plan

## 2021-11-21 NOTE — Assessment & Plan Note (Addendum)
Current total protein is 6.3, mildly reduced.  Albumin is low normal at 3.5. Nutritionist will continue to follow at the SNF.

## 2021-11-21 NOTE — Assessment & Plan Note (Signed)
Staff has not reported any recent seizure activity.  No change in regimen indicated.  Follow-up with Dr. Gerilyn Pilgrim as scheduled.

## 2021-11-21 NOTE — Assessment & Plan Note (Signed)
10/09/2021 platelet count mildly reduced at 142,000.  No bleeding dyscrasias reported by staff.  CBC and differential normal.

## 2021-11-23 ENCOUNTER — Encounter: Payer: Self-pay | Admitting: Internal Medicine

## 2021-11-23 NOTE — Patient Instructions (Signed)
See assessment and plan under each diagnosis in the problem list and acutely for this visit 

## 2021-11-23 NOTE — Assessment & Plan Note (Signed)
TSH will be updated as it was last recorded on 06/19/2017 with a value of 1.59.

## 2021-11-28 ENCOUNTER — Encounter: Payer: Self-pay | Admitting: Adult Health

## 2021-11-28 NOTE — Progress Notes (Signed)
Location:  [facility] Room Number: 116-D Place of Service:  SNF (31)   CODE STATUS: Full Code   No Known Allergies  Chief Complaint  Patient presents with   Acute Visit    Falls    HPI:    Past Medical History:  Diagnosis Date   Diverticulosis OCT 2010 TCS   SIGMOID COLON   Esophageal web 2010   DILATION 16 MM   Gastritis, Helicobacter pylori 2010   Rx. ABO for 10 days    GERD (gastroesophageal reflux disease)    Hip fracture, left (HCC)    2016   Hyperlipidemia    Hypertension    Restless legs syndrome    Seizures (HCC) January 2010   Post head trauma in fall .sees Dr Gerilyn Pilgrim    Past Surgical History:  Procedure Laterality Date   ABDOMINAL HYSTERECTOMY     COLONOSCOPY  OCT 2010   TORTUOUS, Sml IH, Tics, MULTIPLE SIMPLE ADENOMAS (<6MM)   HIP ARTHROPLASTY Left 11/19/2014   Procedure: LEFT PARTIAL HIP REPLACEMENT;  Surgeon: Vickki Hearing, MD;  Location: [facility];  Service: Orthopedics;  Laterality: Left;   ORIF HIP FRACTURE  04/05/2012   Procedure: OPEN REDUCTION INTERNAL FIXATION HIP;  Surgeon: Vickki Hearing, MD;  Location: [facility];  Service: Orthopedics;  Laterality: Right;   UPPER GASTROINTESTINAL ENDOSCOPY  AUG 2010   SAVARY   VESICOVAGINAL FISTULA CLOSURE W/ TAH  1966   bleeding     Social History   Socioeconomic History   Marital status: Widowed    Spouse name: Not on file   Number of children: Not on file   Years of education: Not on file   Highest education level: Not on file  Occupational History   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    Occupation: retired   Tobacco Use   Smoking status: Never   Smokeless tobacco: Never  Building services engineer Use: Never used  Substance and Sexual Activity   Alcohol use: No   Drug use: No   Sexual activity: Not Currently  Other Topics Concern   Not on file  Social History Narrative   Long [facility] resident of [facility]    Social Determinants of Health    Financial Resource Strain: Not on file  Food Insecurity: Not on file  Transportation Needs: Not on file  Physical Activity: Not on file  Stress: Not on file  Social Connections: Not on file  Intimate Partner Violence: Not on file   Family History  Problem Relation Age of Onset   Cancer Mother        bladder    Diabetes Mother    Diabetes Father    Emphysema Father    GER disease Brother    Colon cancer Neg Hx    Colon polyps Neg Hx       VITAL SIGNS BP 117/74    Pulse (!) 56    Temp (!) 97 F (36.1 C) (Temporal)    Resp 20    Ht 5\' 7"  (1.702 m)    Wt 241 lb (109.3 kg)    SpO2 96%    BMI 37.75 kg/m   Outpatient Encounter Medications as of 11/28/2021  Medication Sig   acetaminophen (TYLENOL) 650 MG CR tablet Take 650 mg by mouth 2 (two) times daily.   aspirin 81 MG chewable tablet Chew 81 mg by mouth daily.   Balsam [place]-Castor Oil [facility]) OINT Special Instructions: Apply to sacrum, coccyx and  bilateral buttocks qshift for prevention.   calcium carbonate (TUMS - DOSED IN MG ELEMENTAL CALCIUM) 500 MG chewable tablet Chew 1 tablet by mouth 3 (three) times daily. DX age related osteoporosis without current pathological fracture 07/10/2018 per Dr.Gupta note   Carbidopa-Levodopa ER (SINEMET CR) 25-100 MG tablet controlled release Take 1 tablet by mouth 3 (three) times daily.   FLUoxetine (PROZAC) 40 MG capsule Take 40 mg by mouth daily.   fluticasone-salmeterol (ADVAIR) 100-50 MCG/ACT AEPB Inhale 1 puff into the lungs 2 (two) times daily. Special Instructions: RINSE MOUTH AFTER USE. DISCARD AFTER 1 MONTH   hydrochlorothiazide (HYDRODIURIL) 12.5 MG tablet Take 12.5 mg by mouth daily.   levETIRAcetam (KEPPRA) 500 MG tablet TAKE 1 TABLET BY MOUTH TWICE A DAY AS DIRECTED.   lovastatin (MEVACOR) 20 MG tablet Take 1 tablet (20 mg total) by mouth at bedtime.   Melatonin 5 MG TABS Take 5 mg by mouth at bedtime.   memantine (NAMENDA) 10 MG tablet Take 10 mg by mouth 2 (two) times daily.    Miconazole Nitrate 2 % POWD by Does not apply route. Apply to Redness in folds of skin as Needed   NON FORMULARY Diet type: Regular   potassium chloride (K-DUR,KLOR-CON) 10 MEQ tablet Take 10 mEq by mouth daily.   sennosides-docusate sodium (SENOKOT-S) 8.6-50 MG tablet Take 1 tablet by mouth at bedtime. For Constipation   valbenazine (INGREZZA) 40 MG capsule Take 40 mg by mouth at bedtime.   No facility-administered encounter medications on file as of 11/28/2021.     SIGNIFICANT DIAGNOSTIC EXAMS       ASSESSMENT/ PLAN:     Synthia Innocent NP [facility]  Contact [phone] Monday through Friday 8am- 5pm  After hours call [phone]

## 2021-12-01 NOTE — Progress Notes (Signed)
This encounter was created in error - please disregard.

## 2021-12-18 ENCOUNTER — Encounter: Payer: Self-pay | Admitting: Adult Health

## 2021-12-18 ENCOUNTER — Non-Acute Institutional Stay (SKILLED_NURSING_FACILITY): Payer: Medicare PPO | Admitting: Adult Health

## 2021-12-18 DIAGNOSIS — E785 Hyperlipidemia, unspecified: Secondary | ICD-10-CM | POA: Diagnosis not present

## 2021-12-18 DIAGNOSIS — E876 Hypokalemia: Secondary | ICD-10-CM | POA: Diagnosis not present

## 2021-12-18 DIAGNOSIS — K5909 Other constipation: Secondary | ICD-10-CM

## 2021-12-18 NOTE — Progress Notes (Signed)
Location:  [facility] Room Number: 116 Place of Service:  SNF (31)   CODE STATUS: full code   No Known Allergies  Chief Complaint  Patient presents with   Medical Management of Chronic Issues            Chronic constipation: Hypokalemia:  Unspecified hyperlipidemia:    HPI:  She is a 88 year old long term resident being seen for the management of her chronic illnesses: Chronic constipation: Hypokalemia:  Unspecified hyperlipidemia. There are no reports of uncontrolled pian. There are no reports of agitation; no reports anxiety. Her weight is stable.   Past Medical History:  Diagnosis Date   Diverticulosis OCT 2010 TCS   SIGMOID COLON   Esophageal web 2010   DILATION 16 MM   Gastritis, Helicobacter pylori 2010   Rx. ABO for 10 days    GERD (gastroesophageal reflux disease)    Hip fracture, left (HCC)    2016   Hyperlipidemia    Hypertension    Restless legs syndrome    Seizures (HCC) January 2010   Post head trauma in fall .[place]    Past Surgical History:  Procedure Laterality Date   ABDOMINAL HYSTERECTOMY     COLONOSCOPY  OCT 2010   TORTUOUS, Sml IH, Tics, MULTIPLE SIMPLE ADENOMAS (<6MM)   HIP ARTHROPLASTY Left 11/19/2014   Procedure: LEFT PARTIAL HIP REPLACEMENT;  Surgeon: Vickki HearingStanley E Harrison, MD;  Location: [facility];  Service: Orthopedics;  Laterality: Left;   ORIF HIP FRACTURE  04/05/2012   Procedure: OPEN REDUCTION INTERNAL FIXATION HIP;  Surgeon: Vickki HearingStanley E Harrison, MD;  Location: [facility];  Service: Orthopedics;  Laterality: Right;   UPPER GASTROINTESTINAL ENDOSCOPY  AUG 2010   SAVARY   VESICOVAGINAL FISTULA CLOSURE W/ TAH  1966   bleeding     Social History   Socioeconomic History   Marital status: Widowed    Spouse name: Not on file   Number of children: Not on file   Years of education: Not on file   Highest education level: Not on file  Occupational History   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     Occupation: retired   Tobacco Use   Smoking status: Never   Smokeless tobacco: Never  Building services engineerVaping Use   Vaping Use: Never used  Substance and Sexual Activity   Alcohol use: No   Drug use: No   Sexual activity: Not Currently  Other Topics Concern   Not on file  Social History Narrative   [facility]    Social Determinants of Health   Financial Resource Strain: Not on file  Food Insecurity: Not on file  Transportation Needs: Not on file  Physical Activity: Not on file  Stress: Not on file  Social Connections: Not on file  Intimate Partner Violence: Not on file   Family History  Problem Relation Age of Onset   Cancer Mother        bladder    Diabetes Mother    Diabetes Father    Emphysema Father    GER disease Brother    Colon cancer Neg Hx    Colon polyps Neg Hx       VITAL SIGNS BP (!) 129/59    Pulse 60    Temp (!) 97.2 F (36.2 C)    Resp 18    Ht 5\' 7"  (1.702 m)    Wt 240 lb 9.6 oz (109.1 kg)    BMI  37.68 kg/m   Outpatient Encounter Medications as of 12/18/2021  Medication Sig   acetaminophen (TYLENOL) 650 MG CR tablet Take 650 mg by mouth 2 (two) times daily.   aspirin 81 MG chewable tablet Chew 81 mg by mouth daily.   Balsam [place]-Castor Oil [facility]) OINT Special Instructions: Apply to sacrum, coccyx and bilateral buttocks qshift for prevention.   calcium carbonate (TUMS - DOSED IN MG ELEMENTAL CALCIUM) 500 MG chewable tablet Chew 1 tablet by mouth 3 (three) times daily. DX age related osteoporosis without current pathological fracture 07/10/2018 per Dr.Gupta note   Carbidopa-Levodopa ER (SINEMET CR) 25-100 MG tablet controlled release Take 1 tablet by mouth 3 (three) times daily.   FLUoxetine (PROZAC) 40 MG capsule Take 40 mg by mouth daily.   fluticasone-salmeterol (ADVAIR) 100-50 MCG/ACT AEPB Inhale 1 puff into the lungs 2 (two) times daily. Special Instructions: RINSE MOUTH AFTER USE. DISCARD AFTER 1 MONTH   hydrochlorothiazide (HYDRODIURIL) 12.5  MG tablet Take 12.5 mg by mouth daily.   levETIRAcetam (KEPPRA) 500 MG tablet TAKE 1 TABLET BY MOUTH TWICE A DAY AS DIRECTED.   lovastatin (MEVACOR) 20 MG tablet Take 1 tablet (20 mg total) by mouth at bedtime.   Melatonin 5 MG TABS Take 5 mg by mouth at bedtime.   memantine (NAMENDA) 10 MG tablet Take 10 mg by mouth 2 (two) times daily.   Miconazole Nitrate 2 % POWD by Does not apply route. Apply to Redness in folds of skin as Needed   NON FORMULARY Diet type: Regular   potassium chloride (K-DUR,KLOR-CON) 10 MEQ tablet Take 10 mEq by mouth daily.   sennosides-docusate sodium (SENOKOT-S) 8.6-50 MG tablet Take 1 tablet by mouth at bedtime. For Constipation   valbenazine (INGREZZA) 40 MG capsule Take 40 mg by mouth at bedtime.   No facility-administered encounter medications on file as of 12/18/2021.     SIGNIFICANT DIAGNOSTIC EXAMS   PREVIOUS  09-07-19: DEXA: t score -1.06  NO NEW EXAMS.   LABS REVIEWED PREVIOUS  01-23-21 wbc 4.9; hgb 11.5;hct 36.8; mcv 94.4 plt 162; glucose 89; bun 19; creat 0.74; k+ 3.6; na++ 141; ca 8.5 GFR>60 liver normal albumin 3.4; chol 130; ldl 89; trig 61; hdl 42  5-11,13,15-22: guaiac negative  05-30-21: wbc 4.7; hgb 13.2; hct 40.5; mcv 92.3 plt 156;glucose119; bun 22; creat 0.71; k+ 3.6; na++ 138; ca 8.2 GFR>60 urine culture no growth 08-18-21: glucose 76; bun 19; creat 0.67; k+ 3.4; na++ 141; ca 8.2 GFR>60 10-09-21: wbc 5.5; hgb 12.2; hct 38.1; mcv 93.6 plt 142; glucose 72; bun 27; creat 0.71; k+ 3.4; na++ 143; ca 8.1; GFR>60; liver normal albumin 3.8; chol 133; ldl 84 trig 44; hdl 40  NO NEW LABS.       Review of Systems  Constitutional:  Negative for malaise/fatigue.  Respiratory:  Negative for cough and shortness of breath.   Cardiovascular:  Negative for chest pain, palpitations and leg swelling.  Gastrointestinal:  Negative for abdominal pain, constipation and heartburn.  Musculoskeletal:  Negative for back pain, joint pain and myalgias.  Skin:  Negative.   Neurological:  Negative for dizziness.  Psychiatric/Behavioral:  The patient is not nervous/anxious.    Physical Exam Constitutional:      General: She is not in acute distress.    Appearance: She is well-developed. She is obese. She is not diaphoretic.  Neck:     Thyroid: No thyromegaly.  Cardiovascular:     Rate and Rhythm: Normal rate and regular rhythm.  Pulses: Normal pulses.     Heart sounds: Normal heart sounds.  Pulmonary:     Effort: Pulmonary effort is normal. No respiratory distress.     Breath sounds: Normal breath sounds.  Abdominal:     General: Bowel sounds are normal. There is no distension.     Palpations: Abdomen is soft.     Tenderness: There is no abdominal tenderness.  Musculoskeletal:     Cervical back: Neck supple.     Right lower leg: No edema.     Left lower leg: No edema.     Comments: Is able to move all extremities 2016: left hip fracture left hip  Lymphadenopathy:     Cervical: No cervical adenopathy.  Skin:    General: Skin is warm and dry.  Neurological:     Mental Status: She is alert. Mental status is at baseline.  Psychiatric:        Mood and Affect: Mood normal.        ASSESSMENT/ PLAN:  TODAY:   Chronic constipation: is stable will continue senna s daily   2. Hypokalemia: is stable k+ 3.4 will continue k+ 10 meq daily   3. Unspecified hyperlipidemia: is stable LDL 89; will continue mevacor 20 mg daily   PREVIOUS    4. Anemia: guaiac negative: hgb 11.5  5. Morbid obesity: BMI 38.15 will monitor  6. Unspecified obsessive compulsive disorder: is stable will continue prozac 40 mg daily (has failed 2 weans) melatonin 5 mg nightly   7. Parkinson's disease: is stable will continue sinemet cr 25/100 mg three times daily   8. Tardive dyskinesia: is without change will continue ingrezza 40 mg daily   9. Chronic generalized pain: is stable will continue tylenol 650 mg twice daily   10. Seizure disorder: (after CHI)  no reports of seizure activity; will continue keppra 500 mg twice daily   11. Thrombocytopenia: stable plt 142; will monitor  12. Essential hypertension: is stable 129/59 will continue hctz 12.5 mg daily   13. Late onset alzheimer's disease without behavioral disturbance: is stable weight is 240 pounds; will continue namenda 5 mg twice daily  14. Simple chronic bronchitis: is stable without recent flares: will continue advair 150/50 1 puff twice daily        Synthia Innocent NP [facility]  [phone]

## 2022-01-07 ENCOUNTER — Encounter (HOSPITAL_COMMUNITY)
Admission: RE | Admit: 2022-01-07 | Discharge: 2022-01-07 | Disposition: A | Discharge status: Home / Self Care | Payer: Medicare PPO | Source: Skilled Nursing Facility | Attending: Internal Medicine | Admitting: Internal Medicine

## 2022-01-07 DIAGNOSIS — U071 COVID-19: Secondary | ICD-10-CM | POA: Diagnosis present

## 2022-01-07 LAB — BASIC METABOLIC PANEL
Anion gap: 9 (ref 5–15)
BUN: 20 mg/dL (ref 8–23)
CO2: 30 mmol/L (ref 22–32)
Calcium: 8.5 mg/dL — ABNORMAL LOW (ref 8.9–10.3)
Chloride: 99 mmol/L (ref 98–111)
Creatinine, Ser: 0.73 mg/dL (ref 0.44–1.00)
GFR, Estimated: >60 mL/min (ref >60)
Glucose, Bld: 116 mg/dL — ABNORMAL HIGH (ref 70–99)
Potassium: 3.5 mmol/L (ref 3.5–5.1)
Sodium: 138 mmol/L (ref 135–145)

## 2022-01-07 LAB — CBC
HCT: 40.9 % (ref 36.0–46.0)
Hemoglobin: 13 g/dL (ref 12.0–15.0)
MCH: 29.1 pg (ref 26.0–34.0)
MCHC: 31.8 g/dL (ref 30.0–36.0)
MCV: 91.7 fL (ref 80.0–100.0)
Platelets: 143 10*3/uL — ABNORMAL LOW (ref 150–400)
RBC: 4.46 MIL/uL (ref 3.87–5.11)
RDW: 14.6 % (ref 11.5–15.5)
WBC: 9 10*3/uL (ref 4.0–10.5)
nRBC: 0 % (ref 0.0–0.2)

## 2022-01-07 LAB — D-DIMER, QUANTITATIVE: D-Dimer, Quant: 0.94 ug/mL-FEU — ABNORMAL HIGH (ref 0.00–0.50)

## 2022-01-08 ENCOUNTER — Encounter: Payer: Self-pay | Admitting: Adult Health

## 2022-01-08 ENCOUNTER — Non-Acute Institutional Stay (SKILLED_NURSING_FACILITY): Payer: Medicare PPO | Admitting: Adult Health

## 2022-01-08 ENCOUNTER — Other Ambulatory Visit (HOSPITAL_COMMUNITY)
Admission: RE | Admit: 2022-01-08 | Discharge: 2022-01-08 | Disposition: A | Discharge status: Home / Self Care | Payer: Medicare PPO | Source: Skilled Nursing Facility | Attending: Adult Health | Admitting: Adult Health

## 2022-01-08 DIAGNOSIS — R7982 Elevated C-reactive protein (CRP): Secondary | ICD-10-CM | POA: Diagnosis not present

## 2022-01-08 DIAGNOSIS — U071 COVID-19: Secondary | ICD-10-CM

## 2022-01-08 LAB — C-REACTIVE PROTEIN: CRP: 10.5 mg/dL — ABNORMAL HIGH (ref <1.0)

## 2022-01-08 LAB — SARS CORONAVIRUS 2 BY RT PCR (HOSPITAL ORDER, PERFORMED IN ~~LOC~~ HOSPITAL LAB): SARS Coronavirus 2: POSITIVE — AB

## 2022-01-08 NOTE — Progress Notes (Signed)
?Location:  [facility] ?Nursing Home Room Number: 128-P ?Place of Service:  SNF (31) ? ? ?CODE STATUS: Full Code ? ?No Known Allergies ? ?Chief Complaint  ?Patient presents with  ? Acute Visit  ?  COVID positive   ? ? ?HPI: ? ?She has tested positive for COVID. There are no reports of fevers. No cough; no shortness of breath; no body aches or pain.  ? ?Past Medical History:  ?Diagnosis Date  ? Diverticulosis OCT 2010 TCS  ? SIGMOID COLON  ? Esophageal web 2010  ? DILATION 16 MM  ? Gastritis, Helicobacter pylori 2010  ? Rx. ABO for 10 days   ? GERD (gastroesophageal reflux disease)   ? Hip fracture, left (HCC)   ? 2016  ? Hyperlipidemia   ? Hypertension   ? Restless legs syndrome   ? Seizures [facility](HCC) January 2010  ? Post head trauma in fall .se[place] Dr [place]  ? ? ?Past Surgical History:  ?Procedure Laterality Date  ? ABDOMINAL HYSTERECTOMY    ? COLONOSCOPY  OCT 2010  ? TORTUOUS, Sml IH, Tics, MULTIPLE SIMPLE ADENOMAS (<6MM)  ? HIP ARTHROPLASTY Left 11/19/2014  ? Procedure: LEFT PARTIAL HIP REPLACEMENT;  Surgeon: Vickki HearingStanley E Harrison, MD;  Location: [facility];  Service: Orthopedics;  Laterality: Left;  ? ORIF HIP FRACTURE  04/05/2012  ? Procedure: OPEN REDUCTION INTERNAL FIXATION HIP;  Surgeon: Vickki HearingStanley E Harrison, MD;  Location: [facility];  Service: Orthopedics;  Laterality: Right;  ? UPPER GASTROINTESTINAL ENDOSCOPY  AUG 2010  ? SAVARY  ? VESICOVAGINAL FISTULA CLOSURE W/ TAH  1966  ? bleeding   ? ? ?Social History  ? ?Socioeconomic History  ? Marital status: Widowed  ?  Spouse name: Not on file  ? Number of children: Not on file  ? Years of education: Not on file  ? Highest education level: Not on file  ?Occupational History  ? 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 Occupation: retired   ?Tobacco Use  ? Smoking status: Never  ? Smokeless tobacco: Never  ?Vaping Use  ? Vaping Use: Never used  ?Substance and Sexual Activity  ? Alcohol use: No  ? Drug use: No  ? Sexual activity: Not Currently  ?Other  Topics Concern  ? Not on file  ?Social History Narrative  ? [facility]   ? ?Social Determinants of Health  ? ?Financial Resource Strain: Not on file  ?Food Insecurity: Not on file  ?Transportation Needs: Not on file  ?Physical Activity: Not on file  ?Stress: Not on file  ?Social Connections: Not on file  ?Intimate Partner Violence: Not on file  ? ?Family History  ?Problem Relation Age of Onset  ? Cancer Mother   ?     bladder   ? Diabetes Mother   ? Diabetes Father   ? Emphysema Father   ? GER disease Brother   ? Colon cancer Neg Hx   ? Colon polyps Neg Hx   ? ? ? ? ?VITAL SIGNS ?BP 112/62   Pulse 64   Temp (!) 97.5 ?F (36.4 ?C)   Resp 20   Ht 5\' 7"  (1.702 m)   Wt 240 lb 9.6 oz (109.1 kg)   SpO2 98%   BMI 37.68 kg/m?  ? ?Outpatient Encounter Medications as of 01/08/2022  ?Medication Sig  ? acetaminophen (TYLENOL) 650 MG CR tablet Take 650 mg by mouth 2 (two) times daily.  ? aspirin 81 MG chewable tablet Chew 81 mg  by mouth daily.  ? Balsam Journalist, newspaper [facility]) OINT Special Instructions: Apply to sacrum, coccyx and bilateral buttocks qshift for prevention.  ? calcium carbonate (TUMS - DOSED IN MG ELEMENTAL CALCIUM) 500 MG chewable tablet Chew 1 tablet by mouth 3 (three) times daily. DX age related osteoporosis without current pathological fracture 07/10/2018 per Dr.Gupta note  ? Carbidopa-Levodopa ER (SINEMET CR) 25-100 MG tablet controlled release Take 1 tablet by mouth 3 (three) times daily.  ? Ergocalciferol (VITAMIN D2 PO) Take by mouth. 1,250 mcg (50,000 unit); oral ?Special Instructions: Once A Day Every 7 Days x 2 doses.  ? FLUoxetine (PROZAC) 40 MG capsule Take 40 mg by mouth daily.  ? fluticasone-salmeterol (ADVAIR) 100-50 MCG/ACT AEPB Inhale 1 puff into the lungs 2 (two) times daily. Special Instructions: RINSE MOUTH AFTER USE. DISCARD AFTER 1 MONTH  ? hydrochlorothiazide (HYDRODIURIL) 12.5 MG tablet Take 12.5 mg by mouth daily.  ? levETIRAcetam (KEPPRA) 500 MG tablet TAKE 1 TABLET  BY MOUTH TWICE A DAY AS DIRECTED.  ? lovastatin (MEVACOR) 20 MG tablet Take 1 tablet (20 mg total) by mouth at bedtime.  ? Melatonin 5 MG TABS Take 5 mg by mouth at bedtime.  ? memantine (NAMENDA) 10 MG tablet Take 10 mg by mouth 2 (two) times daily.  ? Miconazole Nitrate 2 % POWD by Does not apply route. Apply to Redness in folds of skin as Needed  ? NON FORMULARY Diet type: Regular  ? potassium chloride (K-DUR,KLOR-CON) 10 MEQ tablet Take 10 mEq by mouth daily.  ? predniSONE (DELTASONE) 20 MG tablet Take 20 mg by mouth in the morning and at bedtime. CRP elevation  ? sennosides-docusate sodium (SENOKOT-S) 8.6-50 MG tablet Take 1 tablet by mouth at bedtime. For Constipation  ? Throat Lozenges (ZINC W/A&C) LOZG Special Instructions: 5 Times Per Dayx 2 weeks.  ? valbenazine (INGREZZA) 40 MG capsule Take 40 mg by mouth at bedtime.  ? vitamin C (ASCORBIC ACID) 500 MG tablet Take 500 mg by mouth 2 (two) times daily.  ? ?No facility-administered encounter medications on file as of 01/08/2022.  ? ? ? ?SIGNIFICANT DIAGNOSTIC EXAMS ? ?PREVIOUS ? ?09-07-19: DEXA: t score -1.06 ? ?TODAY ? ?01-07-22: chest x-ray: mild CHF   ? ?LABS REVIEWED PREVIOUS ? ?01-23-21 wbc 4.9; hgb 11.5;hct 36.8; mcv 94.4 plt 162; glucose 89; bun 19; creat 0.74; k+ 3.6; na++ 141; ca 8.5 GFR>60 liver normal albumin 3.4; chol 130; ldl 89; trig 61; hdl 42  ?1-24,58,09-98: guaiac negative  ?05-30-21: wbc 4.7; hgb 13.2; hct 40.5; mcv 92.3 plt 156;glucose119; bun 22; creat 0.71; k+ 3.6; na++ 138; ca 8.2 GFR>60 urine culture no growth ?08-18-21: glucose 76; bun 19; creat 0.67; k+ 3.4; na++ 141; ca 8.2 GFR>60 ?10-09-21: wbc 5.5; hgb 12.2; hct 38.1; mcv 93.6 plt 142; glucose 72; bun 27; creat 0.71; k+ 3.4; na++ 143; ca 8.1; GFR>60; liver normal albumin 3.8; chol 133; ldl 84 trig 44; hdl 40 ? ?TODAY ? ?01-07-22: wbc 9.0; hgb 13.0; hct 40.9; mcv 91.7 plt 143; glucose 116; bun 20; creat 0.73 ;k+ 3.5; na++ 138; ca 8.6; GFR>60; d-dimer 0.94; CRP 10.5   ?   ? ?Review of  Systems  ?Constitutional:  Negative for malaise/fatigue.  ?Respiratory:  Negative for cough and shortness of breath.   ?Cardiovascular:  Negative for chest pain, palpitations and leg swelling.  ?Gastrointestinal:  Negative for abdominal pain, constipation and heartburn.  ?Musculoskeletal:  Negative for back pain, joint pain and myalgias.  ?Skin: Negative.   ?Neurological:  Negative for dizziness.  ?Psychiatric/Behavioral:  The patient is not nervous/anxious.   ? ? ?Physical Exam ?Constitutional:   ?   General: She is not in acute distress. ?   Appearance: She is well-developed. She is not diaphoretic.  ?Neck:  ?   Thyroid: No thyromegaly.  ?Cardiovascular:  ?   Rate and Rhythm: Normal rate and regular rhythm.  ?   Pulses: Normal pulses.  ?   Heart sounds: Normal heart sounds.  ?Pulmonary:  ?   Effort: Pulmonary effort is normal. No respiratory distress.  ?   Breath sounds: Normal breath sounds.  ?Abdominal:  ?   General: Bowel sounds are normal. There is no distension.  ?   Palpations: Abdomen is soft.  ?   Tenderness: There is no abdominal tenderness.  ?Musculoskeletal:  ?   Cervical back: Neck supple.  ?   Right lower leg: No edema.  ?   Left lower leg: No edema.  ?   Comments: Is able to move all extremities ?2016: left hip fracture left hip  ?Lymphadenopathy:  ?   Cervical: No cervical adenopathy.  ?Skin: ?   General: Skin is warm and dry.  ?Neurological:  ?   Mental Status: She is alert. Mental status is at baseline.  ?Psychiatric:     ?   Mood and Affect: Mood normal.  ? ? ? ?ASSESSMENT/ PLAN: ? ?TODAY ? ?SARS COv -2 positive ?Elevated CRP ? ?Will continue vitamin d, c, zinc and antiviral ? ?Will begin prednisone 20 mg twice daily through 01-12-22 ?Lasix 40 mg daily for 3 days with k+ 20 meq daily  ?Will repeat BMP 01-15-22.  ? ? ?Synthia Innocent NP ?[place] [facility]  ? call [phone]  ? ?

## 2022-01-11 DIAGNOSIS — R7982 Elevated C-reactive protein (CRP): Secondary | ICD-10-CM | POA: Insufficient documentation

## 2022-01-11 DIAGNOSIS — U071 COVID-19: Secondary | ICD-10-CM | POA: Insufficient documentation

## 2022-01-12 ENCOUNTER — Non-Acute Institutional Stay (SKILLED_NURSING_FACILITY): Payer: Medicare PPO | Admitting: Adult Health

## 2022-01-12 ENCOUNTER — Encounter: Payer: Self-pay | Admitting: Adult Health

## 2022-01-12 DIAGNOSIS — D696 Thrombocytopenia, unspecified: Secondary | ICD-10-CM

## 2022-01-12 DIAGNOSIS — J410 Simple chronic bronchitis: Secondary | ICD-10-CM | POA: Diagnosis not present

## 2022-01-12 DIAGNOSIS — G20 Parkinson's disease: Secondary | ICD-10-CM

## 2022-01-12 DIAGNOSIS — G40909 Epilepsy, unspecified, not intractable, without status epilepticus: Secondary | ICD-10-CM | POA: Diagnosis not present

## 2022-01-12 NOTE — Progress Notes (Signed)
? ?Location:  [place] ?Nursing Home Room Number: 128 ?Place of Service:  SNF (31) ? ? ?CODE STATUS: full code  ? ?No Known Allergies ? ?Chief Complaint  ?Patient presents with  ? Acute Visit  ?  Care plan meeting   ? ? ?HPI: ? ?We have come together for her care plan meeting. BIMS 8/15 mood 0/30. She is nonambulatory uses wheelchair. Has had 6 falls over the past quarter without injury. She requires extensive assist with her adl care. She is frequently incontinent of bladder and bowel. Dietary: weight is stable; regular diet good appetite; feeds self. Therapy: PT: min to mod assist with transfers; min assist with bed mobility; 50 feet with contact guard for ambulation.  She continues to be followed for her chronic illnesses including:  Simple chronic bronchitis Parkinson's disease Seizure Thrombocytopenia:  ? ?Past Medical History:  ?Diagnosis Date  ? Diverticulosis OCT 2010 TCS  ? SIGMOID COLON  ? Esophageal web 2010  ? DILATION 16 MM  ? Gastritis, Helicobacter pylori [id number]  ? Rx. ABO for 10 days   ? GERD (gastroesophageal reflux disease)   ? Hip fracture, left ([place])   ? 2016  ? Hyperlipidemia   ? Hypertension   ? Restless legs syndrome   ? Seizures [facility]) January 2010  ? Post head trauma in fall .sees [name]  ? ? ?Past Surgical History:  ?Procedure Laterality Date  ? ABDOMINAL HYSTERECTOMY    ? COLONOSCOPY  OCT 2010  ? TORTUOUS, Sml IH, Tics, MULTIPLE SIMPLE ADENOMAS (<6MM)  ? HIP ARTHROPLASTY Left 11/19/2014  ? Procedure: LEFT PARTIAL HIP REPLACEMENT;  Surgeon: [name], MD;  [facility]: [facility];  Service: Orthopedics;  Laterality: Left;  ? ORIF HIP FRACTURE  04/05/2012  ? Procedure: OPEN REDUCTION INTERNAL FIXATION HIP;  Surgeon: [name], MD;  Location: [facility];  Service: Orthopedics;  Laterality: Right;  ? UPPER GASTROINTESTINAL ENDOSCOPY  AUG 2010  ? SAVARY  ? VESICOVAGINAL FISTULA CLOSURE W/ TAH  1966  ? bleeding   ? ? ?Social History  ? ?Socioeconomic History  ? Marital status:  Widowed  ?  Spouse name: Not on file  ? Number of children: Not on file  ? Years of education: Not on file  ? Highest education level: Not on file  ?Occupational History  ? 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 Occupation: retired   ?Tobacco Use  ? Smoking status: Never  ? Smokeless tobacco: Never  ?Vaping Use  ? Vaping Use: Never used  ?Substance and Sexual Activity  ? Alcohol use: No  ? Drug use: No  ? Sexual activity: Not Currently  ?Other Topics Concern  ? Not on file  ?Social History Narrative  ? Long term resident of [facility]   ? ?Social Determinants of Health  ? ?Financial Resource Strain: Not on file  ?Food Insecurity: Not on file  ?Transportation Needs: Not on file  ?Physical Activity: Not on file  ?Stress: Not on file  ?Social Connections: Not on file  ?Intimate Partner Violence: Not on file  ? ?Family History  ?Problem Relation Age of Onset  ? Cancer Mother   ?     bladder   ? Diabetes Mother   ? Diabetes Father   ? Emphysema Father   ? GER disease Brother   ? Colon cancer Neg Hx   ? Colon polyps Neg Hx   ? ? ? ? ?VITAL SIGNS ?BP (!) 108/56   Pulse 64  Temp 98 ?F (36.7 ?C)   Ht 5\' 7"  (1.702 m)   Wt 240 lb 9.6 oz (109.1 kg)   BMI 37.68 kg/m?  ? ?Outpatient Encounter Medications as of 01/12/2022  ?Medication Sig  ? acetaminophen (TYLENOL) 650 MG CR tablet Take 650 mg by mouth 2 (two) times daily.  ? aspirin 81 MG chewable tablet Chew 81 mg by mouth daily.  ? Balsam Engineer, materials [facility]) OINT Special Instructions: Apply to sacrum, coccyx and bilateral buttocks qshift for prevention.  ? calcium carbonate (TUMS - DOSED IN MG ELEMENTAL CALCIUM) 500 MG chewable tablet Chew 1 tablet by mouth 3 (three) times daily. DX age related osteoporosis without current pathological fracture 07/10/2018 per Dr.Gupta note  ? Carbidopa-Levodopa ER (SINEMET CR) 25-100 MG tablet controlled release Take 1 tablet by mouth 3 (three) times daily.  ? Ergocalciferol (VITAMIN D2 PO) Take by mouth. 1,250 mcg  (50,000 unit); oral ?Special Instructions: Once A Day Every 7 Days x 2 doses.  ? FLUoxetine (PROZAC) 40 MG capsule Take 40 mg by mouth daily.  ? fluticasone-salmeterol (ADVAIR) 100-50 MCG/ACT AEPB Inhale 1 puff into the lungs 2 (two) times daily. Special Instructions: RINSE MOUTH AFTER USE. DISCARD AFTER 1 MONTH  ? hydrochlorothiazide (HYDRODIURIL) 12.5 MG tablet Take 12.5 mg by mouth daily.  ? levETIRAcetam (KEPPRA) 500 MG tablet TAKE 1 TABLET BY MOUTH TWICE A DAY AS DIRECTED.  ? lovastatin (MEVACOR) 20 MG tablet Take 1 tablet (20 mg total) by mouth at bedtime.  ? Melatonin 5 MG TABS Take 5 mg by mouth at bedtime.  ? memantine (NAMENDA) 10 MG tablet Take 10 mg by mouth 2 (two) times daily.  ? Miconazole Nitrate 2 % POWD by Does not apply route. Apply to Redness in folds of skin as Needed  ? NON FORMULARY Diet type: Regular  ? potassium chloride (K-DUR,KLOR-CON) 10 MEQ tablet Take 10 mEq by mouth daily.  ? predniSONE (DELTASONE) 20 MG tablet Take 20 mg by mouth in the morning and at bedtime. CRP elevation  ? sennosides-docusate sodium (SENOKOT-S) 8.6-50 MG tablet Take 1 tablet by mouth at bedtime. For Constipation  ? Throat Lozenges (ZINC W/A&C) LOZG Special Instructions: 5 Times Per Dayx 2 weeks.  ? valbenazine (INGREZZA) 40 MG capsule Take 40 mg by mouth at bedtime.  ? vitamin C (ASCORBIC ACID) 500 MG tablet Take 500 mg by mouth 2 (two) times daily.  ? ?No facility-administered encounter medications on file as of 01/12/2022.  ? ? ? ?SIGNIFICANT DIAGNOSTIC EXAMS ? ?PREVIOUS ? ?09-07-19: DEXA: t score -1.06 ? ?01-07-22: chest x-ray: mild CHF   ? ?NO NEW EXAMS.  ? ?LABS REVIEWED PREVIOUS ? ?01-23-21 wbc 4.9; hgb 11.5;hct 36.8; mcv 94.4 plt 162; glucose 89; bun 19; creat 0.74; k+ 3.6; na++ 141; ca 8.5 GFR>60 liver normal albumin 3.4; chol 130; ldl 89; trig 61; hdl 42  ?5-11,13,15-22: guaiac negative  ?05-30-21: wbc 4.7; hgb 13.2; hct 40.5; mcv 92.3 plt 156;glucose119; bun 22; creat 0.71; k+ 3.6; na++ 138; ca 8.2 GFR>60  urine culture no growth ?08-18-21: glucose 76; bun 19; creat 0.67; k+ 3.4; na++ 141; ca 8.2 GFR>60 ?10-09-21: wbc 5.5; hgb 12.2; hct 38.1; mcv 93.6 plt 142; glucose 72; bun 27; creat 0.71; k+ 3.4; na++ 143; ca 8.1; GFR>60; liver normal albumin 3.8; chol 133; ldl 84 trig 44; hdl 40 ?01-07-22: wbc 9.0; hgb 13.0; hct 40.9; mcv 91.7 plt 143; glucose 116; bun 20; creat 0.73 ;k+ 3.5; na++ 138; ca 8.6; GFR>60; d-dimer 0.94; CRP 10.5  ? ?  NO NEW LABS.   ? ?Review of Systems  ?Constitutional:  Negative for malaise/fatigue.  ?Respiratory:  Negative for cough and shortness of breath.   ?Cardiovascular:  Negative for chest pain, palpitations and leg swelling.  ?Gastrointestinal:  Negative for abdominal pain, constipation and heartburn.  ?Musculoskeletal:  Negative for back pain, joint pain and myalgias.  ?Skin: Negative.   ?Neurological:  Negative for dizziness.  ?Psychiatric/Behavioral:  The patient is not nervous/anxious.   ? ? ?Physical Exam ?Constitutional:   ?   General: She is not in acute distress. ?   Appearance: She is well-developed. She is not diaphoretic.  ?Neck:  ?   Thyroid: No thyromegaly.  ?Cardiovascular:  ?   Rate and Rhythm: Normal rate and regular rhythm.  ?   Pulses: Normal pulses.  ?   Heart sounds: Normal heart sounds.  ?Pulmonary:  ?   Effort: Pulmonary effort is normal. No respiratory distress.  ?   Breath sounds: Normal breath sounds.  ?Abdominal:  ?   General: Bowel sounds are normal. There is no distension.  ?   Palpations: Abdomen is soft.  ?   Tenderness: There is no abdominal tenderness.  ?Musculoskeletal:  ?   Cervical back: Neck supple.  ?   Right lower leg: No edema.  ?   Left lower leg: No edema.  ?   Comments:  Is able to move all extremities ?2016: left hip fracture left hip   ?Lymphadenopathy:  ?   Cervical: No cervical adenopathy.  ?Skin: ?   General: Skin is warm and dry.  ?Neurological:  ?   Mental Status: She is alert. Mental status is at baseline.  ?Psychiatric:     ?   Mood and Affect:  Mood normal.  ? ? ? ?ASSESSMENT/ PLAN: ? ?TODAY ? ?Simple chronic bronchitis ?Parkinson's disease ?Seizure ?Thrombocytopenia:  ? ? ?Will continue current medications ?Will continue current plan of care ?Will continue

## 2022-01-15 ENCOUNTER — Other Ambulatory Visit (HOSPITAL_COMMUNITY)
Admission: RE | Admit: 2022-01-15 | Discharge: 2022-01-15 | Disposition: A | Discharge status: Home / Self Care | Payer: Medicare PPO | Source: Skilled Nursing Facility | Attending: Adult Health | Admitting: Adult Health

## 2022-01-15 DIAGNOSIS — U071 COVID-19: Secondary | ICD-10-CM | POA: Insufficient documentation

## 2022-01-15 LAB — C-REACTIVE PROTEIN: CRP: 0.7 mg/dL (ref <1.0)

## 2022-01-16 ENCOUNTER — Non-Acute Institutional Stay (SKILLED_NURSING_FACILITY): Payer: Medicare PPO | Admitting: Adult Health

## 2022-01-16 ENCOUNTER — Encounter: Payer: Self-pay | Admitting: Adult Health

## 2022-01-16 DIAGNOSIS — G20 Parkinson's disease: Secondary | ICD-10-CM | POA: Diagnosis not present

## 2022-01-16 DIAGNOSIS — G40909 Epilepsy, unspecified, not intractable, without status epilepticus: Secondary | ICD-10-CM | POA: Diagnosis not present

## 2022-01-16 DIAGNOSIS — F429 Obsessive-compulsive disorder, unspecified: Secondary | ICD-10-CM

## 2022-01-16 NOTE — Progress Notes (Signed)
? ?Location:  [place] ?Nursing Home Room Number: 128 ?Place of Service:  SNF (31) ? ? ?CODE STATUS: full  ? ?No Known Allergies ? ?Chief Complaint  ?Patient presents with  ? Medical Management of Chronic Issues ? ?           Morbid obesity: Unspecified obsessive compulsive disorder:  Parkinson's disease  Chronic generalized pain  ? ? ?HPI: ? ?She is being seen for the management of her chronic illnesses:  Morbid obesity: Unspecified obsessive compulsive disorder:  Parkinson's disease  Chronic generalized pain. There are no reports of uncontrolled pain. She has had covid this past month. No reports of anxiety or depressive thoughts.  ? ?Past Medical History:  ?Diagnosis Date  ? Diverticulosis OCT 2010 TCS  ? SIGMOID COLON  ? Esophageal web 2010  ? DILATION 16 MM  ? Gastritis, Helicobacter pylori [id number]  ? Rx. ABO for 10 days   ? GERD (gastroesophageal reflux disease)   ? Hip fracture, left ([place])   ? 2016  ? Hyperlipidemia   ? Hypertension   ? Restless legs syndrome   ? Seizures [facility]) January 2010  ? Post head trauma in fall .sees [name]  ? ? ?Past Surgical History:  ?Procedure Laterality Date  ? ABDOMINAL HYSTERECTOMY    ? COLONOSCOPY  OCT 2010  ? TORTUOUS, Sml IH, Tics, MULTIPLE SIMPLE ADENOMAS (<6MM)  ? HIP ARTHROPLASTY Left 11/19/2014  ? Procedure: LEFT PARTIAL HIP REPLACEMENT;  Surgeon: [name], MD;  Location: [facility];  Service: Orthopedics;  Laterality: Left;  ? ORIF HIP FRACTURE  04/05/2012  ? Procedure: OPEN REDUCTION INTERNAL FIXATION HIP;  Surgeon: [name], MD;  Location: [facility];  Service: Orthopedics;  Laterality: Right;  ? UPPER GASTROINTESTINAL ENDOSCOPY  AUG 2010  ? SAVARY  ? VESICOVAGINAL FISTULA CLOSURE W/ TAH  1966  ? bleeding   ? ? ?Social History  ? ?Socioeconomic History  ? Marital status: Widowed  ?  Spouse name: Not on file  ? Number of children: Not on file  ? Years of education: Not on file  ? Highest education level: Not on file  ?Occupational History  ?  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 Occupation: retired   ?Tobacco Use  ? Smoking status: Never  ? Smokeless tobacco: Never  ?Vaping Use  ? Vaping Use: Never used  ?Substance and Sexual Activity  ? Alcohol use: No  ? Drug use: No  ? Sexual activity: Not Currently  ?Other Topics Concern  ? Not on file  ?Social History Narrative  ? Long term resident of [facility]   ? ?Social Determinants of Health  ? ?Financial Resource Strain: Not on file  ?Food Insecurity: Not on file  ?Transportation Needs: Not on file  ?Physical Activity: Not on file  ?Stress: Not on file  ?Social Connections: Not on file  ?Intimate Partner Violence: Not on file  ? ?Family History  ?Problem Relation Age of Onset  ? Cancer Mother   ?     bladder   ? Diabetes Mother   ? Diabetes Father   ? Emphysema Father   ? GER disease Brother   ? Colon cancer Neg Hx   ? Colon polyps Neg Hx   ? ? ? ? ?VITAL SIGNS ?BP (!) 170/80   Pulse 82   Temp 98 ?F (36.7 ?C)   Resp 20   Ht 5\' 7"  (1.702 m)   Wt 240 lb 9.6 oz (109.1 kg)  SpO2 97%   BMI 37.68 kg/m?  ? ?Outpatient Encounter Medications as of 01/16/2022  ?Medication Sig  ? acetaminophen (TYLENOL) 650 MG CR tablet Take 650 mg by mouth 2 (two) times daily.  ? aspirin 81 MG chewable tablet Chew 81 mg by mouth daily.  ? Balsam Engineer, materials [facility]) OINT Special Instructions: Apply to sacrum, coccyx and bilateral buttocks qshift for prevention.  ? calcium carbonate (TUMS - DOSED IN MG ELEMENTAL CALCIUM) 500 MG chewable tablet Chew 1 tablet by mouth 3 (three) times daily. DX age related osteoporosis without current pathological fracture 07/10/2018 per Dr.Gupta note  ? Carbidopa-Levodopa ER (SINEMET CR) 25-100 MG tablet controlled release Take 1 tablet by mouth 3 (three) times daily.  ? Ergocalciferol (VITAMIN D2 PO) Take by mouth. 1,250 mcg (50,000 unit); oral ?Special Instructions: Once A Day Every 7 Days x 2 doses.  ? FLUoxetine (PROZAC) 40 MG capsule Take 40 mg by mouth daily.  ?  fluticasone-salmeterol (ADVAIR) 100-50 MCG/ACT AEPB Inhale 1 puff into the lungs 2 (two) times daily. Special Instructions: RINSE MOUTH AFTER USE. DISCARD AFTER 1 MONTH  ? hydrochlorothiazide (HYDRODIURIL) 12.5 MG tablet Take 12.5 mg by mouth daily.  ? levETIRAcetam (KEPPRA) 500 MG tablet TAKE 1 TABLET BY MOUTH TWICE A DAY AS DIRECTED.  ? lovastatin (MEVACOR) 20 MG tablet Take 1 tablet (20 mg total) by mouth at bedtime.  ? Melatonin 5 MG TABS Take 5 mg by mouth at bedtime.  ? memantine (NAMENDA) 10 MG tablet Take 10 mg by mouth 2 (two) times daily.  ? Miconazole Nitrate 2 % POWD by Does not apply route. Apply to Redness in folds of skin as Needed  ? NON FORMULARY Diet type: Regular  ? potassium chloride (K-DUR,KLOR-CON) 10 MEQ tablet Take 10 mEq by mouth daily.  ? sennosides-docusate sodium (SENOKOT-S) 8.6-50 MG tablet Take 1 tablet by mouth at bedtime. For Constipation  ? Throat Lozenges (ZINC W/A&C) LOZG Special Instructions: 5 Times Per Dayx 2 weeks.  ? valbenazine (INGREZZA) 40 MG capsule Take 40 mg by mouth at bedtime.  ? vitamin C (ASCORBIC ACID) 500 MG tablet Take 500 mg by mouth 2 (two) times daily.  ? ?No facility-administered encounter medications on file as of 01/16/2022.  ? ? ? ?SIGNIFICANT DIAGNOSTIC EXAMS ? ?PREVIOUS ? ?09-07-19: DEXA: t score -1.06 ? ?01-07-22: chest x-ray: mild CHF   ? ?NO NEW EXAMS.  ? ?LABS REVIEWED PREVIOUS ? ?01-23-21 wbc 4.9; hgb 11.5;hct 36.8; mcv 94.4 plt 162; glucose 89; bun 19; creat 0.74; k+ 3.6; na++ 141; ca 8.5 GFR>60 liver normal albumin 3.4; chol 130; ldl 89; trig 61; hdl 42  ?5-11,13,15-22: guaiac negative  ?05-30-21: wbc 4.7; hgb 13.2; hct 40.5; mcv 92.3 plt 156;glucose119; bun 22; creat 0.71; k+ 3.6; na++ 138; ca 8.2 GFR>60 urine culture no growth ?08-18-21: glucose 76; bun 19; creat 0.67; k+ 3.4; na++ 141; ca 8.2 GFR>60 ?10-09-21: wbc 5.5; hgb 12.2; hct 38.1; mcv 93.6 plt 142; glucose 72; bun 27; creat 0.71; k+ 3.4; na++ 143; ca 8.1; GFR>60; liver normal albumin 3.8; chol  133; ldl 84 trig 44; hdl 40 ?01-07-22: wbc 9.0; hgb 13.0; hct 40.9; mcv 91.7 plt 143; glucose 116; bun 20; creat 0.73 ;k+ 3.5; na++ 138; ca 8.6; GFR>60; d-dimer 0.94; CRP 10.5  ? ?TODAY ? ?01-15-22: CRP 0.7 ? ?Review of Systems  ?Constitutional:  Negative for malaise/fatigue.  ?Respiratory:  Negative for cough and shortness of breath.   ?Cardiovascular:  Negative for chest pain, palpitations and leg swelling.  ?  Gastrointestinal:  Negative for abdominal pain, constipation and heartburn.  ?Musculoskeletal:  Negative for back pain, joint pain and myalgias.  ?Skin: Negative.   ?Neurological:  Negative for dizziness.  ?Psychiatric/Behavioral:  The patient is not nervous/anxious.   ? ?Physical Exam ?Constitutional:   ?   General: She is not in acute distress. ?   Appearance: She is well-developed. She is obese. She is not diaphoretic.  ?Neck:  ?   Thyroid: No thyromegaly.  ?Cardiovascular:  ?   Rate and Rhythm: Normal rate and regular rhythm.  ?   Pulses: Normal pulses.  ?   Heart sounds: Normal heart sounds.  ?Pulmonary:  ?   Effort: Pulmonary effort is normal. No respiratory distress.  ?   Breath sounds: Normal breath sounds.  ?Abdominal:  ?   General: Bowel sounds are normal. There is no distension.  ?   Palpations: Abdomen is soft.  ?   Tenderness: There is no abdominal tenderness.  ?Musculoskeletal:  ?   Cervical back: Neck supple.  ?   Right lower leg: No edema.  ?   Left lower leg: No edema.  ?   Comments:  Is able to move all extremities ?2016: left hip fracture left hip    ?Lymphadenopathy:  ?   Cervical: No cervical adenopathy.  ?Skin: ?   General: Skin is warm and dry.  ?Neurological:  ?   Mental Status: She is alert. Mental status is at baseline.  ?Psychiatric:     ?   Mood and Affect: Mood normal.  ? ? ? ? ?ASSESSMENT/ PLAN: ? ?TODAY:  ? ?Morbid obesity: hyperlipidemia: BMI 37.68 ? ?2. Unspecified obsessive compulsive disorder: will continue prozac 40 mg daily (failed wean X2) melatonin 5 mg nightly  ? ?3.  Parkinson's disease will continue sinemet cr 25/100 mg three times daily  ? ?4. Chronic generalized pain: will continue tylenol 650 mg twice daily   ? ?PREVIOUS  ?  ?5. Seizure disorder: (after [facility]) no reports of seizure acti

## 2022-01-25 ENCOUNTER — Non-Acute Institutional Stay (SKILLED_NURSING_FACILITY): Payer: Medicare PPO | Admitting: Adult Health

## 2022-01-25 ENCOUNTER — Encounter: Payer: Self-pay | Admitting: Adult Health

## 2022-01-25 DIAGNOSIS — Z0000 Encounter for general adult medical examination without abnormal findings: Secondary | ICD-10-CM | POA: Diagnosis not present

## 2022-01-25 NOTE — Progress Notes (Signed)
?Location:  [facility] ?Nursing Home Room Number: 116-D ?Place of Service:  SNF (31) ? ? ?CODE STATUS: Full Code ? ?No Known Allergies ? ?Chief Complaint  ?Patient presents with  ? Medicare Wellness  ?  Annual Wellness Exam  ? ? ?HPI: ? ? ? ?Past Medical History:  ?Diagnosis Date  ? Diverticulosis OCT 2010 TCS  ? SIGMOID COLON  ? Esophageal web 2010  ? DILATION 16 MM  ? Gastritis, Helicobacter pylori 2010  ? Rx. ABO for 10 days   ? GERD (gastroesophageal reflux disease)   ? Hip fracture, left ([facility])   ? 2016  ? Hyperlipidemia   ? Hypertension   ? Restless legs syndrome   ? Seizures [facility]) January 2010  ? Post head trauma in fall .sees Dr Gerilyn Pilgrim  ? ? ?Past Surgical History:  ?Procedure Laterality Date  ? ABDOMINAL HYSTERECTOMY    ? COLONOSCOPY  OCT 2010  ? TORTUOUS, Sml IH, Tics, MULTIPLE SIMPLE ADENOMAS (<6MM)  ? HIP ARTHROPLASTY Left 11/19/2014  ? Procedure: LEFT PARTIAL HIP REPLACEMENT;  Surgeon: Vickki Hearing, MD;  Location: [facility];  Service: Orthopedics;  Laterality: Left;  ? ORIF HIP FRACTURE  04/05/2012  ? Procedure: OPEN REDUCTION INTERNAL FIXATION HIP;  Surgeon: Vickki Hearing, MD;  Location: [facility];  Service: Orthopedics;  Laterality: Right;  ? UPPER GASTROINTESTINAL ENDOSCOPY  AUG 2010  ? SAVARY  ? VESICOVAGINAL FISTULA CLOSURE W/ TAH  1966  ? bleeding   ? ? ?Social History  ? ?Socioeconomic History  ? Marital status: Widowed  ?  Spouse name: Not on file  ? Number of children: Not on file  ? Years of education: Not on file  ? Highest education level: Not on file  ?Occupational History  ? 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 Occupation: retired   ?Tobacco Use  ? Smoking status: Never  ? Smokeless tobacco: Never  ?Vaping Use  ? Vaping Use: Never used  ?Substance and Sexual Activity  ? Alcohol use: No  ? Drug use: No  ? Sexual activity: Not Currently  ?Other Topics Concern  ? Not on file  ?Social History Narrative  ? Long term resident of [facility]   ? ?Social Determinants  of Health  ? ?Financial Resource Strain: Not on file  ?Food Insecurity: Not on file  ?Transportation Needs: Not on file  ?Physical Activity: Not on file  ?Stress: Not on file  ?Social Connections: Not on file  ?Intimate Partner Violence: Not on file  ? ?Family History  ?Problem Relation Age of Onset  ? Cancer Mother   ?     bladder   ? Diabetes Mother   ? Diabetes Father   ? Emphysema Father   ? GER disease Brother   ? Colon cancer Neg Hx   ? Colon polyps Neg Hx   ? ? ? ? ?VITAL SIGNS ?BP (!) 123/59   Pulse (!) 50   Temp (!) 97.1 ?F (36.2 ?C)   Resp 18   Ht 5\' 7"  (1.702 m)   Wt 240 lb 9.6 oz (109.1 kg)   SpO2 96%   BMI 37.68 kg/m?  ? ?Outpatient Encounter Medications as of 01/25/2022  ?Medication Sig  ? acetaminophen (TYLENOL) 650 MG CR tablet Take 650 mg by mouth 2 (two) times daily.  ? aspirin 81 MG chewable tablet Chew 81 mg by mouth daily.  ? 01/27/2022 [facility]) OINT Special Instructions: Apply to sacrum, coccyx and bilateral buttocks qshift for  prevention.  ? calcium carbonate (TUMS - DOSED IN MG ELEMENTAL CALCIUM) 500 MG chewable tablet Chew 1 tablet by mouth 3 (three) times daily. DX age related osteoporosis without current pathological fracture 07/10/2018 per Dr.Gupta note  ? Carbidopa-Levodopa ER (SINEMET CR) 25-100 MG tablet controlled release Take 1 tablet by mouth 3 (three) times daily.  ? FLUoxetine (PROZAC) 40 MG capsule Take 40 mg by mouth daily.  ? fluticasone-salmeterol (ADVAIR) 100-50 MCG/ACT AEPB Inhale 1 puff into the lungs 2 (two) times daily. Special Instructions: RINSE MOUTH AFTER USE. DISCARD AFTER 1 MONTH  ? hydrochlorothiazide (HYDRODIURIL) 12.5 MG tablet Take 12.5 mg by mouth daily.  ? levETIRAcetam (KEPPRA) 500 MG tablet TAKE 1 TABLET BY MOUTH TWICE A DAY AS DIRECTED.  ? lovastatin (MEVACOR) 20 MG tablet Take 1 tablet (20 mg total) by mouth at bedtime.  ? Melatonin 5 MG TABS Take 5 mg by mouth at bedtime.  ? memantine (NAMENDA) 10 MG tablet Take 10 mg by mouth 2 (two)  times daily.  ? Miconazole Nitrate 2 % POWD by Does not apply route. Apply to Redness in folds of skin as Needed  ? NON FORMULARY Diet type: Regular  ? potassium chloride (K-DUR,KLOR-CON) 10 MEQ tablet Take 10 mEq by mouth daily.  ? sennosides-docusate sodium (SENOKOT-S) 8.6-50 MG tablet Take 1 tablet by mouth at bedtime. For Constipation  ? valbenazine (INGREZZA) 40 MG capsule Take 40 mg by mouth at bedtime.  ? [DISCONTINUED] Ergocalciferol (VITAMIN D2 PO) Take by mouth. 1,250 mcg (50,000 unit); oral ?Special Instructions: Once A Day Every 7 Days x 2 doses.  ? ?No facility-administered encounter medications on file as of 01/25/2022.  ? ? ? ?SIGNIFICANT DIAGNOSTIC EXAMS ? ? ? ? ? ? ?ASSESSMENT/ PLAN: ? ? ? ? ?Synthia Innocent NP ?[facility]  ?Contact [phone] Monday through Friday 8am- 5pm  ?After hours call [phone]  ? ?

## 2022-01-29 NOTE — Progress Notes (Signed)
? ?Subjective:  ? Alejandra Cruz is a 88 y.o. female who presents for Medicare Annual (Subsequent) preventive examination. ? ?Review of Systems    ?Review of Systems  ?Constitutional:  Negative for malaise/fatigue.  ?Respiratory:  Negative for cough and shortness of breath.   ?Cardiovascular:  Negative for chest pain, palpitations and leg swelling.  ?Gastrointestinal:  Negative for abdominal pain, constipation and heartburn.  ?Musculoskeletal:  Negative for back pain, joint pain and myalgias.  ?Skin: Negative.   ?Neurological:  Negative for dizziness.  ?Psychiatric/Behavioral:  The patient is not nervous/anxious.   ? ?Cardiac Risk Factors include: advanced age (>40men, >55 women);obesity (BMI >30kg/m2);sedentary lifestyle ? ?   ?Objective:  ?  ?Today's Vitals  ? 01/25/22 1059 01/29/22 1054  ?BP: (!) 123/59   ?Pulse: (!) 50   ?Resp: 18   ?Temp: (!) 97.1 ?F (36.2 ?C)   ?SpO2: 96%   ?Weight: 240 lb 9.6 oz (109.1 kg)   ?Height: 5\' 7"  (1.702 m)   ?PainSc:  0-No pain  ? ?Body mass index is 37.68 kg/m?. ? ? ?  01/25/2022  ? 11:00 AM 01/16/2022  ? 12:01 PM 01/08/2022  ?  3:33 PM 11/28/2021  ?  9:58 AM 11/06/2021  ? 10:56 AM 10/13/2021  ?  9:45 AM 09/06/2021  ?  9:50 AM  ?Advanced Directives  ?Does Patient Have a Medical Advance Directive? No No No No No No No  ?Does patient want to make changes to medical advance directive? No - Patient declined  No - Patient declined No - Patient declined No - Patient declined No - Patient declined No - Patient declined  ? ? ?Current Medications (verified) ?Outpatient Encounter Medications as of 01/25/2022  ?Medication Sig  ? acetaminophen (TYLENOL) 650 MG CR tablet Take 650 mg by mouth 2 (two) times daily.  ? aspirin 81 MG chewable tablet Chew 81 mg by mouth daily.  ? Balsam 01/27/2022 [facility]) OINT Special Instructions: Apply to sacrum, coccyx and bilateral buttocks qshift for prevention.  ? calcium carbonate (TUMS - DOSED IN MG ELEMENTAL CALCIUM) 500 MG chewable tablet Chew 1 tablet by  mouth 3 (three) times daily. DX age related osteoporosis without current pathological fracture 07/10/2018 per Dr.Gupta note  ? Carbidopa-Levodopa ER (SINEMET CR) 25-100 MG tablet controlled release Take 1 tablet by mouth 3 (three) times daily.  ? FLUoxetine (PROZAC) 40 MG capsule Take 40 mg by mouth daily.  ? fluticasone-salmeterol (ADVAIR) 100-50 MCG/ACT AEPB Inhale 1 puff into the lungs 2 (two) times daily. Special Instructions: RINSE MOUTH AFTER USE. DISCARD AFTER 1 MONTH  ? hydrochlorothiazide (HYDRODIURIL) 12.5 MG tablet Take 12.5 mg by mouth daily.  ? levETIRAcetam (KEPPRA) 500 MG tablet TAKE 1 TABLET BY MOUTH TWICE A DAY AS DIRECTED.  ? lovastatin (MEVACOR) 20 MG tablet Take 1 tablet (20 mg total) by mouth at bedtime.  ? Melatonin 5 MG TABS Take 5 mg by mouth at bedtime.  ? memantine (NAMENDA) 10 MG tablet Take 10 mg by mouth 2 (two) times daily.  ? Miconazole Nitrate 2 % POWD by Does not apply route. Apply to Redness in folds of skin as Needed  ? NON FORMULARY Diet type: Regular  ? potassium chloride (K-DUR,KLOR-CON) 10 MEQ tablet Take 10 mEq by mouth daily.  ? sennosides-docusate sodium (SENOKOT-S) 8.6-50 MG tablet Take 1 tablet by mouth at bedtime. For Constipation  ? valbenazine (INGREZZA) 40 MG capsule Take 40 mg by mouth at bedtime.  ? [DISCONTINUED] Ergocalciferol (VITAMIN D2 PO) Take by mouth.  1,250 mcg (50,000 unit); oral ?Special Instructions: Once A Day Every 7 Days x 2 doses.  ? ?No facility-administered encounter medications on file as of 01/25/2022.  ? ? ?Allergies (verified) ?Patient has no known allergies.  ? ?History: ?Past Medical History:  ?Diagnosis Date  ? Diverticulosis OCT 2010 TCS  ? SIGMOID COLON  ? Esophageal web 2010  ? DILATION 16 MM  ? Gastritis, Helicobacter pylori 2010  ? Rx. ABO for 10 days   ? GERD (gastroesophageal reflux disease)   ? Hip fracture, left (HCC)   ? 2016  ? Hyperlipidemia   ? Hypertension   ? Restless legs syndrome   ? Seizures [facility]) January 2010  ? Post head  trauma in fall .sees Dr Gerilyn Pilgrim  ? ?Past Surgical History:  ?Procedure Laterality Date  ? ABDOMINAL HYSTERECTOMY    ? COLONOSCOPY  OCT 2010  ? TORTUOUS, Sml IH, Tics, MULTIPLE SIMPLE ADENOMAS (<6MM)  ? HIP ARTHROPLASTY Left 11/19/2014  ? Procedure: LEFT PARTIAL HIP REPLACEMENT;  Surgeon: Vickki Hearing, MD;  Location: [facility];  Service: Orthopedics;  Laterality: Left;  ? ORIF HIP FRACTURE  04/05/2012  ? Procedure: OPEN REDUCTION INTERNAL FIXATION HIP;  Surgeon: Vickki Hearing, MD;  Location: [facility];  Service: Orthopedics;  Laterality: Right;  ? UPPER GASTROINTESTINAL ENDOSCOPY  AUG 2010  ? SAVARY  ? VESICOVAGINAL FISTULA CLOSURE W/ TAH  1966  ? bleeding   ? ?Family History  ?Problem Relation Age of Onset  ? Cancer Mother   ?     bladder   ? Diabetes Mother   ? Diabetes Father   ? Emphysema Father   ? GER disease Brother   ? Colon cancer Neg Hx   ? Colon polyps Neg Hx   ? ?Social History  ? ?Socioeconomic History  ? Marital status: Widowed  ?  Spouse name: Not on file  ? Number of children: Not on file  ? Years of education: Not on file  ? Highest education level: Not on file  ?Occupational History  ? 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 Occupation: retired   ?Tobacco Use  ? Smoking status: Never  ? Smokeless tobacco: Never  ?Vaping Use  ? Vaping Use: Never used  ?Substance and Sexual Activity  ? Alcohol use: No  ? Drug use: No  ? Sexual activity: Not Currently  ?Other Topics Concern  ? Not on file  ?Social History Narrative  ? Long term resident of [facility]   ? ?Social Determinants of Health  ? ?Financial Resource Strain: Not on file  ?Food Insecurity: Not on file  ?Transportation Needs: Not on file  ?Physical Activity: Not on file  ?Stress: Not on file  ?Social Connections: Not on file  ? ? ?Tobacco Counseling ?Counseling given: Not Answered ? ? ?Clinical Intake: ? ?Pre-visit preparation completed: Yes ? ?Pain : No/denies pain ?Pain Score: 0-No pain ? ?  ? ?BMI - recorded:  37.68 ?Nutritional Status: BMI > 30  Obese ?Nutritional Risks: Unintentional weight gain ?Diabetes: No ? ?How often do you need to have someone help you when you read instructions, pamphlets, or other written materials from your doctor or pharmacy?: 5 - Always ? ?Diabetic?no ? ?Interpreter Needed?: No ? ?  ? ? ?Activities of Daily Living ? ?  01/29/2022  ? 10:57 AM  ?In your present state of health, do you have any difficulty performing the following activities:  ?Hearing? 0  ?Difficulty concentrating or making decisions? 1  ?Walking or  climbing stairs? 1  ?Dressing or bathing? 1  ?Doing errands, shopping? 1  ?Preparing Food and eating ? Y  ?Using the Toilet? Y  ?In the past six months, have you accidently leaked urine? Y  ?Do you have problems with loss of bowel control? Y  ?Managing your Medications? Y  ?Managing your Finances? Y  ?Housekeeping or managing your Housekeeping? Y  ? ? ?Patient Care Team: ?Sharee HolsterGreen, Konor Noren S, NP as PCP - General (Geriatric Medicine) ?Fields, Darleene CleaverSandi L, MD (Inactive) (Gastroenterology) ?[facility] ([facility] Facility) ?Lanelle Balarver, Charles K, DO as Consulting Physician (Internal Medicine) ? ?Indicate any recent Medical Services you may have received from other than [facility] providers in the past year (date may be approximate). ? ?   ?Assessment:  ? This is a routine wellness examination for Alona BeneJoyce. ? ?Hearing/Vision screen ?No results found. ? ?Dietary issues and exercise activities discussed: ?Current Exercise Habits: The patient does not participate in regular exercise at present, Exercise limited by: orthopedic condition(s) ? ? Goals Addressed   ? ?  ?  ?  ?  ? This Visit's Progress  ?  DIET - INCREASE WATER INTAKE   On track  ?  General - Client will not be readmitted within 30 days ([facility])   On track  ?  General - Client will not be readmitted within 30 days ([facility])   On track  ? ?  ? ?Depression Screen ? ?  01/29/2022  ? 10:56 AM 12/18/2021  ?  1:37 PM 10/17/2021  ? 12:03 PM  01/24/2021  ? 11:07 AM 01/22/2020  ? 11:51 AM 07/14/2018  ? 11:03 AM 07/04/2017  ?  8:42 AM  ?PHQ 2/9 Scores  ?PHQ - 2 Score 0 0 0 0 0 1 4  ?PHQ- 9 Score 0 0  2 0  11  ?  ?Fall Risk ? ?  01/29/2022  ? 10:56 AM 12/18/2021  ?  1:38 PM 2/

## 2022-01-29 NOTE — Patient Instructions (Signed)
?  Ms. Reding , ?Thank you for taking time to come for your Medicare Wellness Visit. I appreciate your ongoing commitment to your health goals. Please review the following plan we discussed and let me know if I can assist you in the future.  ? ?These are the goals we discussed: ? Goals   ? ?  DIET - INCREASE WATER INTAKE   ?  General - Client will not be readmitted within 30 days ([facility])   ?  General - Client will not be readmitted within 30 days ([facility])   ? ?  ?  ?This is a list of the screening recommended for you and due dates:  ?Health Maintenance  ?Topic Date Due  ? Flu Shot  05/08/2022  ? Tetanus Vaccine  06/21/2027  ? Pneumonia Vaccine  Completed  ? DEXA scan (bone density measurement)  Completed  ? COVID-19 Vaccine  Completed  ? Zoster (Shingles) Vaccine  Completed  ? HPV Vaccine  Aged Out  ? ? ?

## 2022-02-01 ENCOUNTER — Other Ambulatory Visit (HOSPITAL_COMMUNITY)
Admission: RE | Admit: 2022-02-01 | Discharge: 2022-02-01 | Disposition: A | Discharge status: Home / Self Care | Payer: Medicare PPO | Source: Skilled Nursing Facility | Attending: Adult Health | Admitting: Adult Health

## 2022-02-01 DIAGNOSIS — I10 Essential (primary) hypertension: Secondary | ICD-10-CM | POA: Diagnosis present

## 2022-02-01 LAB — HEPATITIS C ANTIBODY: HCV Ab: NONREACTIVE

## 2022-02-07 ENCOUNTER — Non-Acute Institutional Stay (SKILLED_NURSING_FACILITY): Payer: Medicare PPO | Admitting: Internal Medicine

## 2022-02-07 ENCOUNTER — Encounter: Payer: Self-pay | Admitting: Internal Medicine

## 2022-02-07 DIAGNOSIS — G40909 Epilepsy, unspecified, not intractable, without status epilepticus: Secondary | ICD-10-CM

## 2022-02-07 DIAGNOSIS — I10 Essential (primary) hypertension: Secondary | ICD-10-CM | POA: Diagnosis not present

## 2022-02-07 DIAGNOSIS — D696 Thrombocytopenia, unspecified: Secondary | ICD-10-CM | POA: Diagnosis not present

## 2022-02-07 DIAGNOSIS — F03918 Unspecified dementia, unspecified severity, with other behavioral disturbance: Secondary | ICD-10-CM

## 2022-02-07 NOTE — Assessment & Plan Note (Signed)
BP controlled; no change in antihypertensive medications  

## 2022-02-07 NOTE — Progress Notes (Signed)
? ?NURSING HOME LOCATION:  [place] ?ROOM NUMBER:  [id number] ? ?CODE STATUS:  Full ? ?[id number] Green NP,PSC ? ?This is a nursing facility follow up visit of chronic medical diagnoses & to document compliance with Regulation 483.30 (c) in [employer] [place] Phase 2 which mandates caregiver visit ( visits can alternate among physician, PA or NP as per statutes) within 10 days of 30 days / 60 days/ 90 days post admission to SNF date   ? ?Interim medical record and care since last SNF visit was updated with review of diagnostic studies and change in clinical status since last visit were documented. ? ?HPI: She is a permanent resident of the facility with medical diagnoses of diverticulosis, history of treated Helicobacter gastritis, GERD, dyslipidemia, essential hypertension, restless leg syndrome, and history of seizures. ? ?Labs were completed in early April because of SARS positivity on screening.  C-reactive protein peaked at 10.5 but subsequently dropped to 0.7.  D-dimer was only mildly elevated at 0.94.  CBC and differential were normal.  Minimal thrombocytopenia was present with a platelet count of 143,000.  CKD stage II was present with a creatinine of 0.73 and GFR greater than 60.  She was treated with oral antivirals and oral steroids. ? ?Review of systems: Dementia invalidated responses.  She could not provide the date, even the year.  She could not name the POTUS initially but subsequently stated it was Trump.  She denied any active symptoms except for constipation "sometimes". ? ?Constitutional: No fever, significant weight change, fatigue  ?Eyes: No redness, discharge, pain, vision change ?ENT/mouth: No nasal congestion,  purulent discharge, earache, change in hearing, sore throat  ?Cardiovascular: No chest pain, palpitations, paroxysmal nocturnal dyspnea, claudication, edema  ?Respiratory: No cough, sputum production, hemoptysis, DOE, significant snoring, apnea    ?Gastrointestinal: No heartburn, dysphagia, abdominal pain, nausea /vomiting, rectal bleeding, melena ?Genitourinary: No dysuria, hematuria, pyuria, incontinence, nocturia ?Musculoskeletal: No joint stiffness, joint swelling, weakness, pain ?Dermatologic: No rash, pruritus, change in appearance of skin ?Neurologic: No dizziness, headache, syncope, seizures, numbness, tingling ?Psychiatric: No significant anxiety, depression, insomnia, anorexia ?Endocrine: No change in hair/skin/nails, excessive thirst, excessive hunger, excessive urination  ?Hematologic/lymphatic: No significant bruising, lymphadenopathy, abnormal bleeding ?Allergy/immunology: No itchy/watery eyes, significant sneezing, urticaria, angioedema ? ?Physical exam:  ?Pertinent or positive findings: As noted she exhibits profound neurocognitive deficits.  She is hard of hearing.  Verbal responses are slow.  There is slight exotropia of the right eye.  She is edentulous.  She exhibits repetitive mastication maneuvers.  Heart sounds are distant.  Breath sounds are decreased.  Abdomen is protuberant.  Pedal pulses are decreased.  She has nonpitting edema of the lower extremities.  Initially it appeared if she had some flexion contractures but subsequently was able to extend the fingers.  There was even some hyperextension of the index finger unilaterally.  There is faint splotchy hyperpigmentation over the distal tip of the nose. ? ?General appearance: Adequately nourished; no acute distress, increased work of breathing is present.   ?Lymphatic: No lymphadenopathy about the head, neck, axilla. ?Eyes: No conjunctival inflammation or lid edema is present. There is no scleral icterus. ?Ears:  External ear exam shows no significant lesions or deformities.   ?Nose:  External nasal examination shows no deformity or inflammation. Nasal mucosa are pink and moist without lesions, exudates ?Neck:  No thyromegaly, masses, tenderness noted.    ?Heart:  No gallop,  murmur, click, rub .  ?Lungs:  without wheezes,  rhonchi, rales, rubs. ?Abdomen: Bowel sounds are normal. Abdomen is soft and nontender with no organomegaly, hernias, masses. ?GU: Deferred  ?Extremities:  No cyanosis, clubbing, edema  ?Neurologic exam :Balance, Rhomberg, finger to nose testing could not be completed due to clinical state ?Skin: Warm & dry w/o tenting. ?No significant rash. ? ?See summary under each active problem in the Problem List with associated updated therapeutic plan ? ? ?

## 2022-02-07 NOTE — Assessment & Plan Note (Signed)
No bleeding dyscrasias reported.  There is minimal thrombocytopenia with a platelet count of 143,000. ?

## 2022-02-07 NOTE — Assessment & Plan Note (Addendum)
02/07/2022 she can provide no meaningful history.  She cannot provide the date, even the year.  She named Trump as POTUS. ?

## 2022-02-07 NOTE — Assessment & Plan Note (Signed)
Staff has not reported any seizures.  No change in present regimen indicated. ?

## 2022-02-07 NOTE — Patient Instructions (Signed)
See assessment and plan under each diagnosis in the problem list and acutely for this visit 

## 2022-03-13 ENCOUNTER — Encounter: Payer: Self-pay | Admitting: Adult Health

## 2022-03-13 ENCOUNTER — Non-Acute Institutional Stay (SKILLED_NURSING_FACILITY): Payer: Medicare PPO | Admitting: Adult Health

## 2022-03-13 DIAGNOSIS — I10 Essential (primary) hypertension: Secondary | ICD-10-CM | POA: Diagnosis not present

## 2022-03-13 DIAGNOSIS — D696 Thrombocytopenia, unspecified: Secondary | ICD-10-CM | POA: Diagnosis not present

## 2022-03-13 DIAGNOSIS — F03918 Unspecified dementia, unspecified severity, with other behavioral disturbance: Secondary | ICD-10-CM

## 2022-03-13 DIAGNOSIS — G40909 Epilepsy, unspecified, not intractable, without status epilepticus: Secondary | ICD-10-CM

## 2022-03-13 NOTE — Progress Notes (Unsigned)
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 There are no reports of uncontrolled pain. She does sit out in the hallway daily. She does socialize with others. No reports of chest pain or shortness of breath present.   Past Medical History:  Diagnosis Date   Diverticulosis OCT 2010 TCS   SIGMOID COLON   Esophageal web 2010   DILATION 16 MM   Gastritis, Helicobacter pylori 2010   Rx. ABO for 10 days    GERD (gastroesophageal reflux disease)    Hip fracture, left (HCC)    2016   Hyperlipidemia    Hypertension    Restless legs syndrome    Seizures (HCC) January 2010   Post head trauma in fall .sees Dr Gerilyn Pilgrim    Past Surgical History:  Procedure Laterality Date   ABDOMINAL HYSTERECTOMY     COLONOSCOPY  OCT 2010   TORTUOUS, Sml IH, Tics, MULTIPLE SIMPLE ADENOMAS (<6MM)   HIP ARTHROPLASTY Left 11/19/2014   Procedure: LEFT PARTIAL HIP REPLACEMENT;  Surgeon: Vickki Hearing, MD;  Location: [facility];  Service: Orthopedics;  Laterality: Left;   ORIF HIP FRACTURE  04/05/2012   Procedure: OPEN REDUCTION INTERNAL FIXATION HIP;  Surgeon: Vickki Hearing, MD;  Location: [facility];  Service: Orthopedics;  Laterality: Right;   UPPER GASTROINTESTINAL ENDOSCOPY  AUG 2010   SAVARY   VESICOVAGINAL FISTULA CLOSURE W/ TAH  1966   bleeding     Social History   Socioeconomic History   Marital status: Widowed    Spouse name: Not on file    Number of children: Not on file   Years of education: Not on file   Highest education level: Not on file  Occupational History   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    Occupation: retired   Tobacco Use   Smoking status: Never   Smokeless tobacco: Never  Building services engineer Use: Never used  Substance and Sexual Activity   Alcohol use: No   Drug use: No   Sexual activity: Not Currently  Other Topics Concern   Not on file  Social History Narrative   [facility] resident of [facility]    Social Determinants of Health   Financial Resource Strain: Not on file  Food Insecurity: Not on file  Transportation Needs: Not on file  Physical Activity: Not on file  Stress: Not on file  Social Connections: Not on file  Intimate Partner Violence: Not on file   Family History  Problem Relation Age of Onset   Cancer Mother        bladder    Diabetes Mother    Diabetes Father    Emphysema Father    GER disease Brother    Colon cancer Neg Hx    Colon polyps Neg Hx       VITAL SIGNS BP (!) 103/58   Pulse 68  Temp (!) 97.5 F (36.4 C)   Resp 18   Ht 5\' 7"  (1.702 m)   Wt 236 lb 9.6 oz (107.3 kg)   BMI 37.06 kg/m   Outpatient Encounter Medications as of 03/13/2022  Medication Sig   acetaminophen (TYLENOL) 650 MG CR tablet Take 650 mg by mouth 2 (two) times daily.   aspirin 81 MG chewable tablet Chew 81 mg by mouth daily.   Balsam [place]-Castor Oil [facility]) OINT Special Instructions: Apply to sacrum, coccyx and bilateral buttocks qshift for prevention.   calcium carbonate (TUMS - DOSED IN MG ELEMENTAL CALCIUM) 500 MG chewable tablet Chew 1 tablet by mouth 3 (three) times daily. DX age related osteoporosis without current pathological fracture 07/10/2018 per Dr.Gupta note   Carbidopa-Levodopa ER (SINEMET CR) 25-100 MG tablet controlled release Take 1 tablet by mouth 3 (three) times daily.   FLUoxetine (PROZAC) 40 MG capsule Take 40 mg by mouth daily.    fluticasone-salmeterol (ADVAIR) 100-50 MCG/ACT AEPB Inhale 1 puff into the lungs 2 (two) times daily. Special Instructions: RINSE MOUTH AFTER USE. DISCARD AFTER 1 MONTH   hydrochlorothiazide (HYDRODIURIL) 12.5 MG tablet Take 12.5 mg by mouth daily.   levETIRAcetam (KEPPRA) 500 MG tablet TAKE 1 TABLET BY MOUTH TWICE A DAY AS DIRECTED.   lovastatin (MEVACOR) 20 MG tablet Take 1 tablet (20 mg total) by mouth at bedtime.   Melatonin 5 MG TABS Take 5 mg by mouth at bedtime.   memantine (NAMENDA) 10 MG tablet Take 10 mg by mouth 2 (two) times daily.   Miconazole Nitrate 2 % POWD by Does not apply route. Apply to Redness in folds of skin as Needed   NON FORMULARY Diet type: Regular   potassium chloride (K-DUR,KLOR-CON) 10 MEQ tablet Take 10 mEq by mouth daily.   sennosides-docusate sodium (SENOKOT-S) 8.6-50 MG tablet Take 1 tablet by mouth at bedtime. For Constipation   [DISCONTINUED] valbenazine (INGREZZA) 40 MG capsule Take 40 mg by mouth at bedtime.   No facility-administered encounter medications on file as of 03/13/2022.     SIGNIFICANT DIAGNOSTIC EXAMS  PREVIOUS  09-07-19: DEXA: t score -1.06  01-07-22: chest x-ray: mild CHF    NO NEW EXAMS.   LABS REVIEWED PREVIOUS  5-11,13,15-22: guaiac negative  05-30-21: wbc 4.7; hgb 13.2; hct 40.5; mcv 92.3 plt 156;glucose119; bun 22; creat 0.71; k+ 3.6; na++ 138; ca 8.2 GFR>60 urine culture no growth 08-18-21: glucose 76; bun 19; creat 0.67; k+ 3.4; na++ 141; ca 8.2 GFR>60 10-09-21: wbc 5.5; hgb 12.2; hct 38.1; mcv 93.6 plt 142; glucose 72; bun 27; creat 0.71; k+ 3.4; na++ 143; ca 8.1; GFR>60; liver normal albumin 3.8; chol 133; ldl 84 trig 44; hdl 40 01-07-22: wbc 9.0; hgb 13.0; hct 40.9; mcv 91.7 plt 143; glucose 116; bun 20; creat 0.73 ;k+ 3.5; na++ 138; ca 8.6; GFR>60; d-dimer 0.94; CRP 10.5   TODAY  01-15-22: CRP 0.7 02-01-22: hepatitis C nr    Review of Systems  Constitutional:  Negative for malaise/fatigue.  Respiratory:  Negative for cough  and shortness of breath.   Cardiovascular:  Negative for chest pain, palpitations and leg swelling.  Gastrointestinal:  Negative for abdominal pain, constipation and heartburn.  Musculoskeletal:  Negative for back pain, joint pain and myalgias.  Skin: Negative.   Neurological:  Negative for dizziness.  Psychiatric/Behavioral:  The patient is not nervous/anxious.     Physical Exam Constitutional:      General: She is not in acute distress.    Appearance: She is  well-developed. She is obese. She is not diaphoretic.  Neck:     Thyroid: No thyromegaly.  Cardiovascular:     Rate and Rhythm: Normal rate and regular rhythm.     Heart sounds: Normal heart sounds.  Pulmonary:     Effort: Pulmonary effort is normal. No respiratory distress.     Breath sounds: Normal breath sounds.  Abdominal:     General: Bowel sounds are normal. There is no distension.     Palpations: Abdomen is soft.     Tenderness: There is no abdominal tenderness.  Musculoskeletal:     Cervical back: Neck supple.     Right lower leg: No edema.     Left lower leg: No edema.     Comments: Is able to move all extremities 2016: left hip fracture left hip     Lymphadenopathy:     Cervical: No cervical adenopathy.  Skin:    General: Skin is warm and dry.  Neurological:     Mental Status: She is alert. Mental status is at baseline.  Psychiatric:        Mood and Affect: Mood normal.     ASSESSMENT/ PLAN:  TODAY:   Seizure disorder (after CHI) no recent seizures; will continue keppra 500 mg twice daily  2. Thrombocytopenia: plt 142 will monitor  3. Essential hypertension: b/p 103/58 will continue hctz 12.5 mg daily   4. 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 Simple chronic bronchitis: is stable without recent flares: will continue advair 100/50 1 puff twice daily    6. Chronic constipation: is stable will  continue senna s daily   7. Hypokalemia: is stable k+ 3.4 will continue k+ 10 meq daily   8. Unspecified hyperlipidemia: is stable LDL 89; will continue mevacor 20 mg daily   9. Morbid obesity: hyperlipidemia: BMI 37.06  10. Unspecified obsessive compulsive disorder: will continue prozac 40 mg daily (failed wean X2) melatonin 5 mg nightly   11. Parkinson's disease will continue sinemet cr 25/100 mg three times daily   12. Chronic generalized pain: will continue tylenol 650 mg twice daily      Synthia Innocenteborah Laura Radilla NP [facility]  call [phone]

## 2022-04-05 ENCOUNTER — Non-Acute Institutional Stay (SKILLED_NURSING_FACILITY): Payer: Medicare PPO | Admitting: Adult Health

## 2022-04-05 ENCOUNTER — Encounter: Payer: Self-pay | Admitting: Adult Health

## 2022-04-05 DIAGNOSIS — G20 Parkinson's disease: Secondary | ICD-10-CM

## 2022-04-05 DIAGNOSIS — J410 Simple chronic bronchitis: Secondary | ICD-10-CM | POA: Diagnosis not present

## 2022-04-05 DIAGNOSIS — G40909 Epilepsy, unspecified, not intractable, without status epilepticus: Secondary | ICD-10-CM

## 2022-04-05 NOTE — Progress Notes (Signed)
Location:  [facility] Room Number: 116/D Place of Service:  SNF (31)   CODE STATUS: Full Code  No Known Allergies  Chief Complaint  Patient presents with   Acute Visit    Care plan meeting    HPI:  We have come together for her care plan meeting. BIMS 11/15 mood 0/30. She is nonambulatory propels self has had 2 falls without injury. She requires extensive assist to dependent for her adls. She is frequently incontinent of bladder and bowel. Dietary: regular diet feeds self appetite 75-100% weight is 236.6 pounds down 4 pounds. Her therapy stopped 2 days ago ST for cognition. She does attend activities. She continues to be followed for her chronic illnesses including: Simple chronic bronchitis  Parkinson's disease  Seizure disorder  Past Medical History:  Diagnosis Date   Diverticulosis OCT 2010 TCS   SIGMOID COLON   Esophageal web 2010   DILATION 16 MM   Gastritis, Helicobacter pylori 2010   Rx. ABO for 10 days    GERD (gastroesophageal reflux disease)    Hip fracture, left (HCC)    2016   Hyperlipidemia    Hypertension    Restless legs syndrome    Seizures (HCC) January 2010   Post head trauma in fall .sees Dr Gerilyn Pilgrim    Past Surgical History:  Procedure Laterality Date   ABDOMINAL HYSTERECTOMY     COLONOSCOPY  OCT 2010   TORTUOUS, Sml IH, Tics, MULTIPLE SIMPLE ADENOMAS (<6MM)   HIP ARTHROPLASTY Left 11/19/2014   Procedure: LEFT PARTIAL HIP REPLACEMENT;  Surgeon: Vickki Hearing, MD;  Location: [facility];  Service: Orthopedics;  Laterality: Left;   ORIF HIP FRACTURE  04/05/2012   Procedure: OPEN REDUCTION INTERNAL FIXATION HIP;  Surgeon: Vickki Hearing, MD;  Location: [facility];  Service: Orthopedics;  Laterality: Right;   UPPER GASTROINTESTINAL ENDOSCOPY  AUG 2010   SAVARY   VESICOVAGINAL FISTULA CLOSURE W/ TAH  1966   bleeding     Social History   Socioeconomic History   Marital status: Widowed    Spouse name: Not on file   Number of  children: Not on file   Years of education: Not on file   Highest education level: Not on file  Occupational History   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 agent    Occupation: retired   Tobacco Use   Smoking status: Never   Smokeless tobacco: Never  Building services engineer Use: Never used  Substance and Sexual Activity   Alcohol use: No   Drug use: No   Sexual activity: Not Currently  Other Topics Concern   Not on file  Social History Narrative   Long term resident of [facility]    Social Determinants of Health   Financial Resource Strain: Low Risk  (07/14/2018)   Overall Financial Resource Strain (CARDIA)    Difficulty of Paying Living Expenses: Not hard at all  Food Insecurity: No Food Insecurity (07/14/2018)   Hunger Vital Sign    Worried About Running Out of Food in the Last Year: Never true    Ran Out of Food in the Last Year: Never true  Transportation Needs: No Transportation Needs (07/14/2018)   PRAPARE - Administrator, Civil Service (Medical): No    Lack of Transportation (Non-Medical): No  Physical Activity: Insufficiently Active (07/14/2018)   Exercise Vital Sign    Days of Exercise per Week: 4 days    Minutes of Exercise per Session: 20 min  Stress: Stress Concern Present (07/14/2018)   [employer] of Occupational Health - Occupational Stress Questionnaire    Feeling of Stress : To some extent  Social Connections: Moderately Isolated (07/14/2018)   Social Connection and Isolation Panel [NHANES]    Frequency of Communication with Friends and Family: Twice a week    Frequency of Social Gatherings with Friends and Family: Once a week    Attends Religious Services: Never    Database administrator or Organizations: No    Attends Banker Meetings: Never    Marital Status: Widowed  Intimate Partner Violence: Not At Risk (07/14/2018)   Humiliation, Afraid, Rape, and Kick questionnaire    Fear of Current or Ex-Partner: No     Emotionally Abused: No    Physically Abused: No    Sexually Abused: No   Family History  Problem Relation Age of Onset   Cancer Mother        bladder    Diabetes Mother    Diabetes Father    Emphysema Father    GER disease Brother    Colon cancer Neg Hx    Colon polyps Neg Hx       VITAL SIGNS BP 116/71   Pulse 79   Temp (!) 97.4 F (36.3 C)   Resp 20   Ht 5\' 7"  (1.702 m)   Wt 236 lb 9.6 oz (107.3 kg)   SpO2 100%   BMI 37.06 kg/m   Outpatient Encounter Medications as of 04/05/2022  Medication Sig   acetaminophen (TYLENOL) 650 MG CR tablet Take 650 mg by mouth 2 (two) times daily.   aspirin 81 MG chewable tablet Chew 81 mg by mouth daily.   Balsam [place]-Castor Oil [facility]) OINT Special Instructions: Apply to sacrum, coccyx and bilateral buttocks qshift for prevention.   calcium carbonate (TUMS - DOSED IN MG ELEMENTAL CALCIUM) 500 MG chewable tablet Chew 1 tablet by mouth 3 (three) times daily. DX age related osteoporosis without current pathological fracture 07/10/2018 per Dr.Gupta note   Carbidopa-Levodopa ER (SINEMET CR) 25-100 MG tablet controlled release Take 1 tablet by mouth 3 (three) times daily.   FLUoxetine (PROZAC) 40 MG capsule Take 40 mg by mouth daily.   fluticasone-salmeterol (ADVAIR) 100-50 MCG/ACT AEPB Inhale 1 puff into the lungs 2 (two) times daily. Special Instructions: RINSE MOUTH AFTER USE. DISCARD AFTER 1 MONTH   hydrochlorothiazide (HYDRODIURIL) 12.5 MG tablet Take 12.5 mg by mouth daily.   levETIRAcetam (KEPPRA) 500 MG tablet TAKE 1 TABLET BY MOUTH TWICE A DAY AS DIRECTED.   lovastatin (MEVACOR) 20 MG tablet Take 1 tablet (20 mg total) by mouth at bedtime.   Melatonin 5 MG TABS Take 5 mg by mouth at bedtime.   memantine (NAMENDA) 10 MG tablet Take 10 mg by mouth 2 (two) times daily.   Miconazole Nitrate 2 % POWD by Does not apply route. Apply to Redness in folds of skin as Needed   NON FORMULARY Diet type: Regular   potassium chloride  (K-DUR,KLOR-CON) 10 MEQ tablet Take 10 mEq by mouth daily.   sennosides-docusate sodium (SENOKOT-S) 8.6-50 MG tablet Take 1 tablet by mouth at bedtime. For Constipation   No facility-administered encounter medications on file as of 04/05/2022.     SIGNIFICANT DIAGNOSTIC EXAMS  PREVIOUS  09-07-19: DEXA: t score -1.06  01-07-22: chest x-ray: mild CHF    NO NEW EXAMS.   LABS REVIEWED PREVIOUS  5-11,13,15-22: guaiac negative  05-30-21: wbc 4.7; hgb 13.2; hct 40.5; mcv 92.3  plt 156;glucose119; bun 22; creat 0.71; k+ 3.6; na++ 138; ca 8.2 GFR>60 urine culture no growth 08-18-21: glucose 76; bun 19; creat 0.67; k+ 3.4; na++ 141; ca 8.2 GFR>60 10-09-21: wbc 5.5; hgb 12.2; hct 38.1; mcv 93.6 plt 142; glucose 72; bun 27; creat 0.71; k+ 3.4; na++ 143; ca 8.1; GFR>60; liver normal albumin 3.8; chol 133; ldl 84 trig 44; hdl 40 01-07-22: wbc 9.0; hgb 13.0; hct 40.9; mcv 91.7 plt 143; glucose 116; bun 20; creat 0.73 ;k+ 3.5; na++ 138; ca 8.6; GFR>60; d-dimer 0.94; CRP 10.5  01-15-22: CRP 0.7 02-01-22: hepatitis C nr  NO NEW LABS.    Review of Systems  Constitutional:  Negative for malaise/fatigue.  Respiratory:  Negative for cough and shortness of breath.   Cardiovascular:  Negative for chest pain, palpitations and leg swelling.  Gastrointestinal:  Negative for abdominal pain, constipation and heartburn.  Musculoskeletal:  Negative for back pain, joint pain and myalgias.  Skin: Negative.   Neurological:  Negative for dizziness.  Psychiatric/Behavioral:  The patient is not nervous/anxious.     Physical Exam Constitutional:      General: She is not in acute distress.    Appearance: She is well-developed. She is obese. She is not diaphoretic.  Neck:     Thyroid: No thyromegaly.  Cardiovascular:     Rate and Rhythm: Normal rate and regular rhythm.     Pulses: Normal pulses.     Heart sounds: Normal heart sounds.  Pulmonary:     Effort: Pulmonary effort is normal. No respiratory distress.      Breath sounds: Normal breath sounds.  Abdominal:     General: Bowel sounds are normal. There is no distension.     Palpations: Abdomen is soft.     Tenderness: There is no abdominal tenderness.  Musculoskeletal:     Cervical back: Neck supple.     Right lower leg: No edema.     Left lower leg: No edema.     Comments: Is able to move all extremities 2016: left hip fracture left hip  Lymphadenopathy:     Cervical: No cervical adenopathy.  Skin:    General: Skin is warm and dry.  Neurological:     Mental Status: She is alert. Mental status is at baseline.  Psychiatric:        Mood and Affect: Mood normal.       ASSESSMENT/ PLAN:  TODAY  Simple chronic bronchitis Parkinson's disease Seizure disorder  Will continue current medications Will continue current plan of care Will continue to monitor her status.   Time spent with patient: 40 minutes: medications; dietary; plan of care.    Synthia Innocent NP [facility]  call [phone]

## 2022-04-18 ENCOUNTER — Encounter: Payer: Self-pay | Admitting: Adult Health

## 2022-04-18 ENCOUNTER — Non-Acute Institutional Stay (SKILLED_NURSING_FACILITY): Payer: Medicare PPO | Admitting: Adult Health

## 2022-04-18 DIAGNOSIS — E876 Hypokalemia: Secondary | ICD-10-CM | POA: Diagnosis not present

## 2022-04-18 DIAGNOSIS — J410 Simple chronic bronchitis: Secondary | ICD-10-CM

## 2022-04-18 DIAGNOSIS — K5909 Other constipation: Secondary | ICD-10-CM

## 2022-04-18 DIAGNOSIS — E785 Hyperlipidemia, unspecified: Secondary | ICD-10-CM

## 2022-04-18 NOTE — Progress Notes (Signed)
Location:  [facility] Room Number: 116-D Place of Service:  SNF (31)   CODE STATUS: Full Code  No Known Allergies  Chief Complaint  Patient presents with   Medical Management of Chronic Issues                            Simple chronic bronchitis: Chronic constipation:  Hypokalemia:  Unspecified hyperlipidemia:    HPI:  She is a 88 year old long term resident of this facility being seen for the management of her chronic illnesses:  Simple chronic bronchitis: Chronic constipation:  Hypokalemia:  Unspecified hyperlipidemia. There are no reports of uncontrolled pain. There are no reports of constipation present. She has lost some weight but is insignificant. No therapy at this time.   Past Medical History:  Diagnosis Date   Diverticulosis OCT 2010 TCS   SIGMOID COLON   Esophageal web 2010   DILATION 16 MM   Gastritis, Helicobacter pylori 2010   Rx. ABO for 10 days    GERD (gastroesophageal reflux disease)    Hip fracture, left (HCC)    2016   Hyperlipidemia    Hypertension    Restless legs syndrome    Seizures (HCC) January 2010   Post head trauma in fall .sees Dr Gerilyn Pilgrim    Past Surgical History:  Procedure Laterality Date   ABDOMINAL HYSTERECTOMY     COLONOSCOPY  OCT 2010   TORTUOUS, Sml IH, Tics, MULTIPLE SIMPLE ADENOMAS (<6MM)   HIP ARTHROPLASTY Left 11/19/2014   Procedure: LEFT PARTIAL HIP REPLACEMENT;  Surgeon: Vickki Hearing, MD;  Location: [facility];  Service: Orthopedics;  Laterality: Left;   ORIF HIP FRACTURE  04/05/2012   Procedure: OPEN REDUCTION INTERNAL FIXATION HIP;  Surgeon: Vickki Hearing, MD;  Location: [facility];  Service: Orthopedics;  Laterality: Right;   UPPER GASTROINTESTINAL ENDOSCOPY  AUG 2010   SAVARY   VESICOVAGINAL FISTULA CLOSURE W/ TAH  1966   bleeding     Social History   Socioeconomic History   Marital status: Widowed    Spouse name: Not on file   Number of children: Not on file   Years of education: Not  on file   Highest education level: Not on file  Occupational History   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 agent    Occupation: retired   Tobacco Use   Smoking status: Never   Smokeless tobacco: Never  Building services engineer Use: Never used  Substance and Sexual Activity   Alcohol use: No   Drug use: No   Sexual activity: Not Currently  Other Topics Concern   Not on file  Social History Narrative   Long term resident of [facility]    Social Determinants of Health   Financial Resource Strain: Low Risk  (07/14/2018)   Overall Financial Resource Strain (CARDIA)    Difficulty of Paying Living Expenses: Not hard at all  Food Insecurity: No Food Insecurity (07/14/2018)   Hunger Vital Sign    Worried About Running Out of Food in the Last Year: Never true    Ran Out of Food in the Last Year: Never true  Transportation Needs: No Transportation Needs (07/14/2018)   PRAPARE - Administrator, Civil Service (Medical): No    Lack of Transportation (Non-Medical): No  Physical Activity: Insufficiently Active (07/14/2018)   Exercise Vital Sign    Days of Exercise per Week: 4 days  Minutes of Exercise per Session: 20 min  Stress: Stress Concern Present (07/14/2018)   [employer] of Occupational Health - Occupational Stress Questionnaire    Feeling of Stress : To some extent  Social Connections: Moderately Isolated (07/14/2018)   Social Connection and Isolation Panel [NHANES]    Frequency of Communication with Friends and Family: Twice a week    Frequency of Social Gatherings with Friends and Family: Once a week    Attends Religious Services: Never    Database administrator or Organizations: No    Attends Banker Meetings: Never    Marital Status: Widowed  Intimate Partner Violence: Not At Risk (07/14/2018)   Humiliation, Afraid, Rape, and Kick questionnaire    Fear of Current or Ex-Partner: No    Emotionally Abused: No    Physically Abused: No     Sexually Abused: No   Family History  Problem Relation Age of Onset   Cancer Mother        bladder    Diabetes Mother    Diabetes Father    Emphysema Father    GER disease Brother    Colon cancer Neg Hx    Colon polyps Neg Hx       VITAL SIGNS BP 115/68   Pulse 62   Temp 97.8 F (36.6 C)   Resp 18   Ht 5\' 7"  (1.702 m)   Wt 227 lb 3.2 oz (103.1 kg)   SpO2 97%   BMI 35.58 kg/m   Outpatient Encounter Medications as of 04/18/2022  Medication Sig   acetaminophen (TYLENOL) 650 MG CR tablet Take 650 mg by mouth 2 (two) times daily.   aspirin 81 MG chewable tablet Chew 81 mg by mouth daily.   Balsam [place]-Castor Oil [facility]) OINT Special Instructions: Apply to sacrum, coccyx and bilateral buttocks qshift for prevention.   calcium carbonate (TUMS - DOSED IN MG ELEMENTAL CALCIUM) 500 MG chewable tablet Chew 1 tablet by mouth 3 (three) times daily. DX age related osteoporosis without current pathological fracture 07/10/2018 per Dr.Gupta note   Carbidopa-Levodopa ER (SINEMET CR) 25-100 MG tablet controlled release Take 1 tablet by mouth 3 (three) times daily.   FLUoxetine (PROZAC) 40 MG capsule Take 40 mg by mouth daily.   fluticasone-salmeterol (ADVAIR) 100-50 MCG/ACT AEPB Inhale 1 puff into the lungs 2 (two) times daily. Special Instructions: RINSE MOUTH AFTER USE. DISCARD AFTER 1 MONTH   hydrochlorothiazide (HYDRODIURIL) 12.5 MG tablet Take 12.5 mg by mouth daily.   levETIRAcetam (KEPPRA) 250 MG tablet Take 250 mg by mouth 2 (two) times daily.   lovastatin (MEVACOR) 20 MG tablet Take 1 tablet (20 mg total) by mouth at bedtime.   Magnesium Hydroxide (MILK OF MAGNESIA PO) Take by mouth. 30 ml by mouth daily as needed for constipation.   Melatonin 5 MG TABS Take 5 mg by mouth at bedtime.   memantine (NAMENDA) 10 MG tablet Take 10 mg by mouth 2 (two) times daily.   Miconazole Nitrate 2 % POWD by Does not apply route. Apply to Redness in folds of skin as Needed   NON FORMULARY Diet type:  Regular   potassium chloride (K-DUR,KLOR-CON) 10 MEQ tablet Take 10 mEq by mouth daily.   sennosides-docusate sodium (SENOKOT-S) 8.6-50 MG tablet Take 1 tablet by mouth in the morning and at bedtime. For Constipation   [DISCONTINUED] levETIRAcetam (KEPPRA) 500 MG tablet TAKE 1 TABLET BY MOUTH TWICE A DAY AS DIRECTED.   No facility-administered encounter medications on file as  of 04/18/2022.     SIGNIFICANT DIAGNOSTIC EXAMS  PREVIOUS  09-07-19: DEXA: t score -1.06  01-07-22: chest x-ray: mild CHF    NO NEW EXAMS.   LABS REVIEWED PREVIOUS  05-30-21: wbc 4.7; hgb 13.2; hct 40.5; mcv 92.3 plt 156;glucose119; bun 22; creat 0.71; k+ 3.6; na++ 138; ca 8.2 GFR>60 urine culture no growth 08-18-21: glucose 76; bun 19; creat 0.67; k+ 3.4; na++ 141; ca 8.2 GFR>60 10-09-21: wbc 5.5; hgb 12.2; hct 38.1; mcv 93.6 plt 142; glucose 72; bun 27; creat 0.71; k+ 3.4; na++ 143; ca 8.1; GFR>60; liver normal albumin 3.8; chol 133; ldl 84 trig 44; hdl 40 01-07-22: wbc 9.0; hgb 13.0; hct 40.9; mcv 91.7 plt 143; glucose 116; bun 20; creat 0.73 ;k+ 3.5; na++ 138; ca 8.6; GFR>60; d-dimer 0.94; CRP 10.5  01-15-22: CRP 0.7 02-01-22: hepatitis C nr   NO NEW LABS.   Review of Systems  Constitutional:  Negative for malaise/fatigue.  Respiratory:  Negative for cough and shortness of breath.   Cardiovascular:  Negative for chest pain, palpitations and leg swelling.  Gastrointestinal:  Negative for abdominal pain, constipation and heartburn.  Musculoskeletal:  Negative for back pain, joint pain and myalgias.  Skin: Negative.   Neurological:  Negative for dizziness.  Psychiatric/Behavioral:  The patient is not nervous/anxious.    Physical Exam Constitutional:      General: She is not in acute distress.    Appearance: She is well-developed. She is obese. She is not diaphoretic.  Neck:     Thyroid: No thyromegaly.  Cardiovascular:     Rate and Rhythm: Normal rate and regular rhythm.     Pulses: Normal pulses.      Heart sounds: Normal heart sounds.  Pulmonary:     Effort: Pulmonary effort is normal. No respiratory distress.     Breath sounds: Normal breath sounds.  Abdominal:     General: Bowel sounds are normal. There is no distension.     Palpations: Abdomen is soft.     Tenderness: There is no abdominal tenderness.  Musculoskeletal:        General: Normal range of motion.     Cervical back: Neck supple.     Right lower leg: No edema.     Left lower leg: No edema.     Comments:  Is able to move all extremities 2016: left hip fracture left hip   Lymphadenopathy:     Cervical: No cervical adenopathy.  Skin:    General: Skin is warm and dry.  Neurological:     Mental Status: She is alert. Mental status is at baseline.  Psychiatric:        Mood and Affect: Mood normal.       ASSESSMENT/ PLAN:  TODAY:   Simple chronic bronchitis: without recent flares: will continue advair 100/50 1 puff twice daily   2. Chronic constipation: will continue senna s daily   3. Hypokalemia: k+ 3.4 will continue k+ 10 meq daily   4. Unspecified hyperlipidemia: LDL 89; will continue mevacor 20 mg daily   PREVIOUS    5. Morbid obesity: hyperlipidemia: BMI 35.58  6. Unspecified obsessive compulsive disorder: will continue prozac 40 mg daily (failed wean X2) melatonin 5 mg nightly   7. Parkinson's disease will continue sinemet cr 25/100 mg three times daily   8. Chronic generalized pain: will continue tylenol 650 mg twice daily    9. Seizure disorder (after CHI) no recent seizures; will continue keppra 500 mg twice daily  10. Thrombocytopenia: plt 142 will monitor  11. Essential hypertension: b/p 115/68 will continue hctz 12.5 mg daily   12. Late onset alzheimer's disease without behavioral disturbance: weight is 227 pounds; will continue namenda 5 mg twice daily     Synthia Innocent NP [facility]   call [phone]

## 2022-05-17 ENCOUNTER — Encounter: Payer: Self-pay | Admitting: Internal Medicine

## 2022-05-17 ENCOUNTER — Non-Acute Institutional Stay (SKILLED_NURSING_FACILITY): Payer: Medicare PPO | Admitting: Internal Medicine

## 2022-05-17 DIAGNOSIS — G40909 Epilepsy, unspecified, not intractable, without status epilepticus: Secondary | ICD-10-CM | POA: Diagnosis not present

## 2022-05-17 DIAGNOSIS — H547 Unspecified visual loss: Secondary | ICD-10-CM

## 2022-05-17 DIAGNOSIS — L84 Corns and callosities: Secondary | ICD-10-CM | POA: Diagnosis not present

## 2022-05-17 DIAGNOSIS — D696 Thrombocytopenia, unspecified: Secondary | ICD-10-CM

## 2022-05-17 DIAGNOSIS — I10 Essential (primary) hypertension: Secondary | ICD-10-CM | POA: Diagnosis not present

## 2022-05-17 NOTE — Progress Notes (Signed)
NURSING HOME LOCATION:  [facility] ROOM NUMBER:  116 D  CODE STATUS:  Full Code  PCP:  Synthia Innocent NP  This is a nursing facility follow up visit of chronic medical diagnoses & to document compliance with Regulation 483.30 (c) in The Long Term Care Survey Manual Phase 2 which mandates caregiver visit ( visits can alternate among physician, PA or NP as per statutes) within 10 days of 30 days / 60 days/ 90 days post admission to SNF date    Interim medical record and care since last SNF visit was updated with review of diagnostic studies and change in clinical status since last visit were documented.  HPI: She is permanent resident of this facility with medical diagnoses of Parkinson's,history of diverticulosis, history of H. pylori gastritis, GERD, dyslipidemia, essential hypertension, RLS, and history of seizures.  Significant procedures include  colonoscopy, EGD, and abdominal hysterectomy with vesicovaginal fistula closure.  Most recent labs were in April of this year when she tested positive for COVID.  She had also tested positive for COVID in September 2020.  In April CBC and differential was normal but D-dimer was elevated at 0.94 and C-reactive protein to 10.5.  Mild thrombocytopenia was stable with a value of 143,000.  CKD stage II was present with a creatinine of 0.73 and GFR greater than 60.  Lipids were performed in January and revealed LDL of 84 and HDL of 40 on a statin.  Review of systems: Her major complaint is "I cannot half see."  She also describes 2 corns on her right foot.  Constipation is a chronic issue.  Otherwise review of systems is negative.  Constitutional: No fever, significant weight change, fatigue  Eyes: No redness, discharge, pain ENT/mouth: No nasal congestion,  purulent discharge, earache, change in hearing, sore throat  Cardiovascular: No chest pain, palpitations, paroxysmal nocturnal dyspnea, edema  Respiratory: No cough, sputum  production, hemoptysis, DOE, significant snoring, apnea   Gastrointestinal: No heartburn, dysphagia, abdominal pain, nausea /vomiting, rectal bleeding, melena Genitourinary: No dysuria, hematuria, pyuria, incontinence, nocturia Musculoskeletal: No joint stiffness, joint swelling, weakness, pain Dermatologic: No rash, pruritus Neurologic: No dizziness, headache, syncope, seizures, numbness, tingling Psychiatric: No significant anxiety, depression, insomnia, anorexia Endocrine: No change in hair/skin/nails, excessive thirst, excessive hunger, excessive urination  Hematologic/lymphatic: No significant bruising, lymphadenopathy, abnormal bleeding Allergy/immunology: No itchy/watery eyes, significant sneezing, urticaria, angioedema  Physical exam:  Pertinent or positive findings: Extraocular motion and field of vision are grossly intact.  She was able to count fingers with each eye.  She does have intermittent slight exotropia of the right eye.  She is edentulous.  She exhibits a constant mastication type maneuver of the mandible.  Heart sounds are markedly distant.  Breath sounds are decreased.  Abdomen is protuberant.  Pedal pulses are decreased.  The lower extremities are stronger than the upper extremities to opposition.  She has very minor spotty vitiliginous changes over the dorsum of the hands.  General appearance: Adequately nourished; no acute distress, increased work of breathing is present.   Lymphatic: No lymphadenopathy about the head, neck, axilla. Eyes: No conjunctival inflammation or lid edema is present. There is no scleral icterus. Ears:  External ear exam shows no significant lesions or deformities.   Nose:  External nasal examination shows no deformity or inflammation. Nasal mucosa are pink and moist without lesions, exudates Oral exam:  Lips and gums are healthy appearing. There is no oropharyngeal erythema or exudate. Neck:  No thyromegaly, masses, tenderness  noted.    Heart:   No gallop, murmur, click, rub .  Lungs:  without wheezes, rhonchi, rales, rubs. Abdomen: Bowel sounds are normal. Abdomen is soft and nontender with no organomegaly, hernias, masses. GU: Deferred  Extremities:  No cyanosis, clubbing, edema  Neurologic exam :Balance, Rhomberg, finger to nose testing could not be completed due to clinical state Skin: Warm & dry w/o tenting. No significant lesions or rash.  See summary under each active problem in the Problem List with associated updated therapeutic plan

## 2022-05-17 NOTE — Assessment & Plan Note (Signed)
Thrombocytopenia is mild and stable with no reported bleeding dyscrasias at the SNF.

## 2022-05-17 NOTE — Patient Instructions (Signed)
See assessment and plan under each diagnosis in the problem list and acutely for this visit 

## 2022-05-17 NOTE — Assessment & Plan Note (Addendum)
No reported seizure activity @ SNF.  Neurology follow-up as scheduled.

## 2022-05-17 NOTE — Assessment & Plan Note (Addendum)
BP controlled; no change in low dose HCTZ

## 2022-06-18 ENCOUNTER — Encounter: Payer: Self-pay | Admitting: Adult Health

## 2022-06-18 ENCOUNTER — Non-Acute Institutional Stay (SKILLED_NURSING_FACILITY): Payer: Medicare PPO | Admitting: Adult Health

## 2022-06-18 DIAGNOSIS — R52 Pain, unspecified: Secondary | ICD-10-CM

## 2022-06-18 DIAGNOSIS — G20 Parkinson's disease: Secondary | ICD-10-CM

## 2022-06-18 DIAGNOSIS — F429 Obsessive-compulsive disorder, unspecified: Secondary | ICD-10-CM | POA: Diagnosis not present

## 2022-06-18 DIAGNOSIS — E669 Obesity, unspecified: Secondary | ICD-10-CM | POA: Diagnosis not present

## 2022-06-18 DIAGNOSIS — G8929 Other chronic pain: Secondary | ICD-10-CM

## 2022-06-18 NOTE — Progress Notes (Signed)
Location:  [facility] Room Number: 116 Place of Service:  SNF (31)   CODE STATUS: full   No Known Allergies  Chief Complaint  Patient presents with   Medical Management of Chronic Issues                             Morbid obesity:  Unspecified obsessive compulsive disorder:  Parkinson's disease:  Chronic generalized pain    HPI:  She is a 88 year old long term resident of this facility being seen for the management of her chronic illnesses: Morbid obesity:  Unspecified obsessive compulsive disorder:  Parkinson's disease:  Chronic generalized pain. There are no reports of uncontrolled pain. No reports of anxiety or agitation. Weight is stable.   Past Medical History:  Diagnosis Date   Diverticulosis OCT 2010 TCS   SIGMOID COLON   Esophageal web 2010   DILATION 16 MM   Gastritis, Helicobacter pylori 2010   Rx. ABO for 10 days    GERD (gastroesophageal reflux disease)    Hip fracture, left (HCC)    2016   Hyperlipidemia    Hypertension    Restless legs syndrome    Seizures (HCC) January 2010   Post head trauma in fall .sees Dr Gerilyn Pilgrim    Past Surgical History:  Procedure Laterality Date   ABDOMINAL HYSTERECTOMY     COLONOSCOPY  OCT 2010   TORTUOUS, Sml IH, Tics, MULTIPLE SIMPLE ADENOMAS (<6MM)   HIP ARTHROPLASTY Left 11/19/2014   Procedure: LEFT PARTIAL HIP REPLACEMENT;  Surgeon: Vickki Hearing, MD;  Location: [facility];  Service: Orthopedics;  Laterality: Left;   ORIF HIP FRACTURE  04/05/2012   Procedure: OPEN REDUCTION INTERNAL FIXATION HIP;  Surgeon: Vickki Hearing, MD;  Location: [facility];  Service: Orthopedics;  Laterality: Right;   UPPER GASTROINTESTINAL ENDOSCOPY  AUG 2010   SAVARY   VESICOVAGINAL FISTULA CLOSURE W/ TAH  1966   bleeding     Social History   Socioeconomic History   Marital status: Widowed    Spouse name: Not on file   Number of children: Not on file   Years of education: Not on file   Highest education level:  Not on file  Occupational History   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 agent    Occupation: retired   Tobacco Use   Smoking status: Never   Smokeless tobacco: Never  Building services engineer Use: Never used  Substance and Sexual Activity   Alcohol use: No   Drug use: No   Sexual activity: Not Currently  Other Topics Concern   Not on file  Social History Narrative   Long term resident of [facility]    Social Determinants of Health   Financial Resource Strain: Low Risk  (07/14/2018)   Overall Financial Resource Strain (CARDIA)    Difficulty of Paying Living Expenses: Not hard at all  Food Insecurity: No Food Insecurity (07/14/2018)   Hunger Vital Sign    Worried About Running Out of Food in the Last Year: Never true    Ran Out of Food in the Last Year: Never true  Transportation Needs: No Transportation Needs (07/14/2018)   PRAPARE - Administrator, Civil Service (Medical): No    Lack of Transportation (Non-Medical): No  Physical Activity: Insufficiently Active (07/14/2018)   Exercise Vital Sign    Days of Exercise per Week: 4 days    Minutes of  Exercise per Session: 20 min  Stress: Stress Concern Present (07/14/2018)   [employer] of Occupational Health - Occupational Stress Questionnaire    Feeling of Stress : To some extent  Social Connections: Moderately Isolated (07/14/2018)   Social Connection and Isolation Panel [NHANES]    Frequency of Communication with Friends and Family: Twice a week    Frequency of Social Gatherings with Friends and Family: Once a week    Attends Religious Services: Never    Database administrator or Organizations: No    Attends Banker Meetings: Never    Marital Status: Widowed  Intimate Partner Violence: Not At Risk (07/14/2018)   Humiliation, Afraid, Rape, and Kick questionnaire    Fear of Current or Ex-Partner: No    Emotionally Abused: No    Physically Abused: No    Sexually Abused: No   Family History   Problem Relation Age of Onset   Cancer Mother        bladder    Diabetes Mother    Diabetes Father    Emphysema Father    GER disease Brother    Colon cancer Neg Hx    Colon polyps Neg Hx       VITAL SIGNS BP 110/71   Pulse (!) 59   Temp (!) 97.5 F (36.4 C)   Resp 20   Ht 5\' 7"  (1.702 m)   Wt 230 lb 3.2 oz (104.4 kg)   SpO2 97%   BMI 36.05 kg/m   Outpatient Encounter Medications as of 06/18/2022  Medication Sig   acetaminophen (TYLENOL) 650 MG CR tablet Take 650 mg by mouth 2 (two) times daily.   aspirin 81 MG chewable tablet Chew 81 mg by mouth daily.   Balsam [place]-Castor Oil [facility]) OINT Special Instructions: Apply to sacrum, coccyx and bilateral buttocks qshift for prevention.   calcium carbonate (TUMS - DOSED IN MG ELEMENTAL CALCIUM) 500 MG chewable tablet Chew 1 tablet by mouth 3 (three) times daily. DX age related osteoporosis without current pathological fracture 07/10/2018 per Dr.Gupta note   Carbidopa-Levodopa ER (SINEMET CR) 25-100 MG tablet controlled release Take 1 tablet by mouth 3 (three) times daily.   FLUoxetine (PROZAC) 40 MG capsule Take 40 mg by mouth daily.   fluticasone-salmeterol (ADVAIR) 100-50 MCG/ACT AEPB Inhale 1 puff into the lungs 2 (two) times daily. Special Instructions: RINSE MOUTH AFTER USE. DISCARD AFTER 1 MONTH   hydrochlorothiazide (HYDRODIURIL) 12.5 MG tablet Take 12.5 mg by mouth daily.   levETIRAcetam (KEPPRA) 250 MG tablet Take 250 mg by mouth 2 (two) times daily.   lovastatin (MEVACOR) 20 MG tablet Take 1 tablet (20 mg total) by mouth at bedtime.   Magnesium Hydroxide (MILK OF MAGNESIA PO) Take by mouth. 30 ml by mouth daily as needed for constipation.   Melatonin 5 MG TABS Take 5 mg by mouth at bedtime.   memantine (NAMENDA) 10 MG tablet Take 10 mg by mouth 2 (two) times daily.   Miconazole Nitrate 2 % POWD by Does not apply route. Apply to Redness in folds of skin as Needed   NON FORMULARY Diet type: Regular   potassium chloride  (K-DUR,KLOR-CON) 10 MEQ tablet Take 10 mEq by mouth daily.   sennosides-docusate sodium (SENOKOT-S) 8.6-50 MG tablet Take 1 tablet by mouth in the morning and at bedtime. For Constipation   No facility-administered encounter medications on file as of 06/18/2022.     SIGNIFICANT DIAGNOSTIC EXAMS   PREVIOUS  09-07-19: DEXA: t score -1.06  01-07-22: chest x-ray: mild CHF    NO NEW EXAMS.   LABS REVIEWED PREVIOUS  08-18-21: glucose 76; bun 19; creat 0.67; k+ 3.4; na++ 141; ca 8.2 GFR>60 10-09-21: wbc 5.5; hgb 12.2; hct 38.1; mcv 93.6 plt 142; glucose 72; bun 27; creat 0.71; k+ 3.4; na++ 143; ca 8.1; GFR>60; liver normal albumin 3.8; chol 133; ldl 84 trig 44; hdl 40 01-07-22: wbc 9.0; hgb 13.0; hct 40.9; mcv 91.7 plt 143; glucose 116; bun 20; creat 0.73 ;k+ 3.5; na++ 138; ca 8.6; GFR>60; d-dimer 0.94; CRP 10.5  01-15-22: CRP 0.7 02-01-22: hepatitis C nr   NO NEW LABS.   Review of Systems  Constitutional:  Negative for malaise/fatigue.  Respiratory:  Negative for cough and shortness of breath.   Cardiovascular:  Negative for chest pain, palpitations and leg swelling.  Gastrointestinal:  Negative for abdominal pain, constipation and heartburn.  Musculoskeletal:  Negative for back pain, joint pain and myalgias.  Skin: Negative.   Neurological:  Negative for dizziness.  Psychiatric/Behavioral:  The patient is not nervous/anxious.    Physical Exam Constitutional:      General: She is not in acute distress.    Appearance: She is well-developed. She is obese. She is not diaphoretic.  Neck:     Thyroid: No thyromegaly.  Cardiovascular:     Rate and Rhythm: Normal rate and regular rhythm.     Pulses: Normal pulses.     Heart sounds: Normal heart sounds.  Pulmonary:     Effort: Pulmonary effort is normal. No respiratory distress.     Breath sounds: Normal breath sounds.  Abdominal:     General: Bowel sounds are normal. There is no distension.     Palpations: Abdomen is soft.      Tenderness: There is no abdominal tenderness.  Musculoskeletal:     Cervical back: Neck supple.     Right lower leg: No edema.     Left lower leg: No edema.     Comments:  Is able to move all extremities 2016: left hip fracture left hip    Lymphadenopathy:     Cervical: No cervical adenopathy.  Skin:    General: Skin is warm and dry.  Neurological:     Mental Status: She is alert. Mental status is at baseline.  Psychiatric:        Mood and Affect: Mood normal.        ASSESSMENT/ PLAN:  TODAY:   Morbid obesity: hyperlipidemia: BMI 36.05  2. Unspecified obsessive compulsive disorder: will continue prozac 40 mg daily (has failed 2 weans) melatonin 5 mg nightly   3. Parkinson's disease: will continue sinemet cr 25/100 mg three times daily   4. Chronic generalized pain: will continue tylenol 650 mg twice daily    PREVIOUS    5. Seizure disorder (after CHI) no recent seizures; will continue keppra 500 mg twice daily  6. Thrombocytopenia: plt 142 will monitor  7. Essential hypertension: b/p 110/71 will continue hctz 12.5 mg daily   8. Late onset alzheimer's disease without behavioral disturbance: weight is 230 pounds; will continue namenda 5 mg twice daily   9. Simple chronic bronchitis: without recent flares: will continue advair 100/50 1 puff twice daily   10. Chronic constipation: will continue senna s daily   11. Hypokalemia: k+ 3.4 will continue k+ 10 meq daily   12. Unspecified hyperlipidemia: LDL 89; will continue mevacor 20 mg daily     Synthia Innocent NP [facility]   call [phone]

## 2022-07-23 ENCOUNTER — Non-Acute Institutional Stay (SKILLED_NURSING_FACILITY): Payer: Medicare PPO | Admitting: Adult Health

## 2022-07-23 ENCOUNTER — Encounter: Payer: Self-pay | Admitting: Adult Health

## 2022-07-23 DIAGNOSIS — G40909 Epilepsy, unspecified, not intractable, without status epilepticus: Secondary | ICD-10-CM

## 2022-07-23 DIAGNOSIS — I10 Essential (primary) hypertension: Secondary | ICD-10-CM

## 2022-07-23 DIAGNOSIS — F03918 Unspecified dementia, unspecified severity, with other behavioral disturbance: Secondary | ICD-10-CM | POA: Diagnosis not present

## 2022-07-23 DIAGNOSIS — D696 Thrombocytopenia, unspecified: Secondary | ICD-10-CM

## 2022-07-23 NOTE — Progress Notes (Unsigned)
Location:  [place] Room Number: 116 Place of Service:  SNF (31)   CODE STATUS: full   No Known Allergies  Chief Complaint  Patient presents with   Medical Management of Chronic Issues                                Seizure disorder  Thrombocytopenia;  Essential hypertension:  Late onset alzheimer's disease without behavioral disturbance     HPI:  She is a 88 year old long term resident of this facility being seen for the management of her chronic illnesses:   Seizure disorder  Thrombocytopenia;  Essential hypertension:  Late onset alzheimer's disease without behavioral disturbance. She has had one fall without injury. There are no reports of uncontrolled pain. There are no reports of anxiety or agitation.   Past Medical History:  Diagnosis Date   Diverticulosis OCT 2010 TCS   SIGMOID COLON   Esophageal web 2010   DILATION 16 MM   Gastritis, Helicobacter pylori 6160   Rx. ABO for 10 days    GERD (gastroesophageal reflux disease)    Hip fracture, left ([place])    2016   Hyperlipidemia    Hypertension    Restless legs syndrome    Seizures ([place]) January 2010   Post head trauma in fall .sees [name]    Past Surgical History:  Procedure Laterality Date   ABDOMINAL HYSTERECTOMY     COLONOSCOPY  OCT 2010   TORTUOUS, Sml IH, Tics, MULTIPLE SIMPLE ADENOMAS (<6MM)   HIP ARTHROPLASTY Left 11/19/2014   Procedure: LEFT PARTIAL HIP REPLACEMENT;  Surgeon: [name], MD;  Location: [facility];  Service: Orthopedics;  Laterality: Left;   ORIF HIP FRACTURE  04/05/2012   Procedure: OPEN REDUCTION INTERNAL FIXATION HIP;  Surgeon: [name], MD;  Location: [facility];  Service: Orthopedics;  Laterality: Right;   UPPER GASTROINTESTINAL ENDOSCOPY  AUG 2010   SAVARY   VESICOVAGINAL FISTULA CLOSURE W/ TAH  1966   bleeding     Social History   Socioeconomic History   Marital status: Widowed    Spouse name: Not on file   Number of children: Not on  file   Years of education: Not on file   Highest education level: Not on file  Occupational History   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Market researcher    Occupation: retired   Tobacco Use   Smoking status: Never   Smokeless tobacco: Never  Scientific laboratory technician Use: Never used  Substance and Sexual Activity   Alcohol use: No   Drug use: No   Sexual activity: Not Currently  Other Topics Concern   Not on file  Social History Narrative   Long term resident of [facility]    Social Determinants of Health   Financial Resource Strain: [place]  (07/14/2018)   Overall Financial Resource Strain (CARDIA)    Difficulty of Paying Living Expenses: Not hard at all  Food Insecurity: No Food Insecurity (07/14/2018)   Hunger Vital Sign    Worried About Running Out of Food in the Last Year: Never true    [place] in the Last Year: Never true  Transportation Needs: No Transportation Needs (07/14/2018)   PRAPARE - Hydrologist (Medical): No    Lack of Transportation (Non-Medical): No  Physical Activity: Insufficiently Active (07/14/2018)   Exercise Vital Sign  Days of Exercise per Week: 4 days    Minutes of Exercise per Session: 20 min  Stress: Stress Concern Present (07/14/2018)   [employer] of Occupational Health - Occupational Stress Questionnaire    Feeling of Stress : To some extent  Social Connections: Moderately Isolated (07/14/2018)   Social Connection and Isolation Panel [NHANES]    Frequency of Communication with Friends and Family: Twice a week    Frequency of Social Gatherings with Friends and Family: Once a week    Attends Religious Services: Never    Database administrator or Organizations: No    Attends Banker Meetings: Never    Marital Status: Widowed  Intimate Partner Violence: Not At Risk (07/14/2018)   Humiliation, Afraid, Rape, and Kick questionnaire    Fear of Current or Ex-Partner: No    Emotionally Abused: No     Physically Abused: No    Sexually Abused: No   Family History  Problem Relation Age of Onset   Cancer Mother        bladder    Diabetes Mother    Diabetes Father    Emphysema Father    GER disease Brother    Colon cancer Neg Hx    Colon polyps Neg Hx       VITAL SIGNS BP 122/78   Pulse 70   Temp 98 F (36.7 C)   Resp 18   Ht 5\' 7"  (1.702 m)   Wt 230 lb 3.2 oz (104.4 kg)   SpO2 97%   BMI 36.05 kg/m   Outpatient Encounter Medications as of 07/23/2022  Medication Sig   acetaminophen (TYLENOL) 650 MG CR tablet Take 650 mg by mouth 2 (two) times daily.   aspirin 81 MG chewable tablet Chew 81 mg by mouth daily.   Balsam [place]-Castor Oil [facility]) OINT Special Instructions: Apply to sacrum, coccyx and bilateral buttocks qshift for prevention.   calcium carbonate (TUMS - DOSED IN MG ELEMENTAL CALCIUM) 500 MG chewable tablet Chew 1 tablet by mouth 3 (three) times daily. DX age related osteoporosis without current pathological fracture 07/10/2018 per Dr.Gupta note   Carbidopa-Levodopa ER (SINEMET CR) 25-100 MG tablet controlled release Take 1 tablet by mouth 3 (three) times daily.   FLUoxetine (PROZAC) 40 MG capsule Take 40 mg by mouth daily.   fluticasone-salmeterol (ADVAIR) 100-50 MCG/ACT AEPB Inhale 1 puff into the lungs 2 (two) times daily. Special Instructions: RINSE MOUTH AFTER USE. DISCARD AFTER 1 MONTH   hydrochlorothiazide (HYDRODIURIL) 12.5 MG tablet Take 12.5 mg by mouth daily.   levETIRAcetam (KEPPRA) 250 MG tablet Take 250 mg by mouth 2 (two) times daily.   lovastatin (MEVACOR) 20 MG tablet Take 1 tablet (20 mg total) by mouth at bedtime.   Magnesium Hydroxide (MILK OF MAGNESIA PO) Take by mouth. 30 ml by mouth daily as needed for constipation.   Melatonin 5 MG TABS Take 5 mg by mouth at bedtime.   memantine (NAMENDA) 10 MG tablet Take 10 mg by mouth 2 (two) times daily.   Miconazole Nitrate 2 % POWD by Does not apply route. Apply to Redness in folds of skin as Needed    NON FORMULARY Diet type: Regular   potassium chloride (K-DUR,KLOR-CON) 10 MEQ tablet Take 10 mEq by mouth daily.   sennosides-docusate sodium (SENOKOT-S) 8.6-50 MG tablet Take 1 tablet by mouth in the morning and at bedtime. For Constipation   No facility-administered encounter medications on file as of 07/23/2022.     SIGNIFICANT DIAGNOSTIC  EXAMS   PREVIOUS  09-07-19: DEXA: t score -1.06  01-07-22: chest x-ray: mild CHF    NO NEW EXAMS.   LABS REVIEWED PREVIOUS  08-18-21: glucose 76; bun 19; creat 0.67; k+ 3.4; na++ 141; ca 8.2 GFR>60 10-09-21: wbc 5.5; hgb 12.2; hct 38.1; mcv 93.6 plt 142; glucose 72; bun 27; creat 0.71; k+ 3.4; na++ 143; ca 8.1; GFR>60; liver normal albumin 3.8; chol 133; ldl 84 trig 44; hdl 40 01-07-22: wbc 9.0; hgb 13.0; hct 40.9; mcv 91.7 plt 143; glucose 116; bun 20; creat 0.73 ;k+ 3.5; na++ 138; ca 8.6; GFR>60; d-dimer 0.94; CRP 10.5  01-15-22: CRP 0.7 02-01-22: hepatitis C nr   NO NEW LABS.   Review of Systems  Constitutional:  Negative for malaise/fatigue.  Respiratory:  Negative for cough and shortness of breath.   Cardiovascular:  Negative for chest pain, palpitations and leg swelling.  Gastrointestinal:  Negative for abdominal pain, constipation and heartburn.  Musculoskeletal:  Negative for back pain, joint pain and myalgias.  Skin: Negative.   Neurological:  Negative for dizziness.  Psychiatric/Behavioral:  The patient is not nervous/anxious.    Physical Exam Constitutional:      General: She is not in acute distress.    Appearance: She is well-developed. She is obese. She is not diaphoretic.  Neck:     Thyroid: No thyromegaly.  Cardiovascular:     Rate and Rhythm: Normal rate and regular rhythm.     Pulses: Normal pulses.     Heart sounds: Normal heart sounds.  Pulmonary:     Effort: Pulmonary effort is normal. No respiratory distress.     Breath sounds: Normal breath sounds.  Abdominal:     General: Bowel sounds are normal. There is no  distension.     Palpations: Abdomen is soft.     Tenderness: There is no abdominal tenderness.  Musculoskeletal:     Cervical back: Neck supple.     Right lower leg: No edema.     Left lower leg: No edema.     Comments:  Is able to move all extremities 2016: left hip fracture left hip     Lymphadenopathy:     Cervical: No cervical adenopathy.  Skin:    General: Skin is warm and dry.  Neurological:     Mental Status: She is alert. Mental status is at baseline.  Psychiatric:        Mood and Affect: Mood normal.        ASSESSMENT/ PLAN:  TODAY:   Seizure disorder (after CHI) no recent seizures; will continue keppra 500 mg twice daily   2. Thrombocytopenia; plt 142 will monitor   3. Essential hypertension: b/p 122/78 will continue hctz 12.5 mg daily with k+ 10 meq daily   4. Late onset alzheimer's disease without behavioral disturbance weight is 230 pounds will continue namenda 5 mg twice daily   PREVIOUS    5. Simple chronic bronchitis: without recent flares: will continue advair 100/50 1 puff twice daily   6. Chronic constipation: will continue senna s daily   7. Hypokalemia: k+ 3.4 will continue k+ 10 meq daily   8. Unspecified hyperlipidemia: LDL 89; will continue mevacor 20 mg daily   9. Morbid obesity: hyperlipidemia: BMI 36.05  10. Unspecified obsessive compulsive disorder: will continue prozac 40 mg daily (has failed 2 weans) melatonin 5 mg nightly   11. Parkinson's disease: will continue sinemet cr 25/100 mg three times daily   12. Chronic generalized pain: will continue tylenol  650 mg twice daily     Will check cbc;cmp; lipids     Synthia Innocent NP [facility]   call [phone]

## 2022-08-21 ENCOUNTER — Non-Acute Institutional Stay (SKILLED_NURSING_FACILITY): Payer: Medicare PPO | Admitting: Internal Medicine

## 2022-08-21 ENCOUNTER — Encounter: Payer: Self-pay | Admitting: Internal Medicine

## 2022-08-21 DIAGNOSIS — G40909 Epilepsy, unspecified, not intractable, without status epilepticus: Secondary | ICD-10-CM | POA: Diagnosis not present

## 2022-08-21 DIAGNOSIS — I10 Essential (primary) hypertension: Secondary | ICD-10-CM

## 2022-08-21 DIAGNOSIS — K5909 Other constipation: Secondary | ICD-10-CM | POA: Diagnosis not present

## 2022-08-21 DIAGNOSIS — F03918 Unspecified dementia, unspecified severity, with other behavioral disturbance: Secondary | ICD-10-CM | POA: Diagnosis not present

## 2022-08-21 NOTE — Progress Notes (Unsigned)
NURSING HOME LOCATION:  [facility] ROOM NUMBER:  [id number]  CODE STATUS:  Full Code  PCP:  Synthia Innocent NP  This is a nursing facility follow up visit of chronic medical diagnoses & to document compliance with Regulation 483.30 (c) in The Long Term Care Survey Manual Phase 2 which mandates caregiver visit ( visits can alternate among physician, PA or NP as per statutes) within 10 days of 30 days / 60 days/ 90 days post admission to SNF date    Interim medical record and care since last SNF visit was updated with review of diagnostic studies and change in clinical status since last visit were documented.  HPI: She is a permanent resident of this facility with history of diverticulosis, history of GERD with esophageal web, essential hypertension, RLS, history of seizures, dementia,and dyslipidemia.  Labs were last performed in April when she was positive for COVID and revealed mild hyperglycemia with a glucose of 116.  Calcium was mildly reduced at 8.5.  CBC and differential was normal except for mildly reduced platelet count of 143,000.  On 10/09/2021 LDL was 84; she is on lovastatin 20 mg daily.  Review of systems: Dementia invalidated responses.  Her only positive response to queries was "nerves make me jump" when I asked whether she were having anxiety or depression.  She also describes "a little bit of diarrhea".  When asked whether this was a loose or watery; her response was "how much"? Staff validates she has not had any seizure activity.  They state that her behavioral issues are "micro focusing" and some argumentative behavior.  For instance yesterday she wanted to go back to bed before 3 PM and became louder in her protest when she was not allowed to do so.  Apparently she stated "just because you get up early in the morning does not mean I have to". Nurse states that corn pads have been of benefit for a foot lesion.  Constitutional: No fever, significant weight change,  fatigue  Eyes: No redness, discharge, pain, vision change ENT/mouth: No nasal congestion,  purulent discharge, earache, change in hearing, sore throat  Cardiovascular: No chest pain, palpitations, paroxysmal nocturnal dyspnea, claudication, edema  Respiratory: No cough, sputum production, hemoptysis, DOE, significant snoring, apnea   Gastrointestinal: No heartburn, dysphagia, abdominal pain, nausea /vomiting, rectal bleeding, melena Genitourinary: No dysuria, hematuria, pyuria, incontinence, nocturia Musculoskeletal: No joint stiffness, joint swelling, weakness, pain Dermatologic: No rash, pruritus, change in appearance of skin Neurologic: No dizziness, headache, syncope, seizures, numbness, tingling Psychiatric: No significant insomnia, anorexia Endocrine: No change in hair/skin/nails, excessive thirst, excessive hunger, excessive urination  Hematologic/lymphatic: No significant bruising, lymphadenopathy, abnormal bleeding Allergy/immunology: No itchy/watery eyes, significant sneezing, urticaria, angioedema  Physical exam:  Pertinent or positive findings: Initially she was asleep in the wheelchair.  She was exhibiting constant mastication like activity of the mandible.  She did awaken with a start when I began to auscultate her chest.  While asleep she did not exhibit snoring or apnea.  Upon awakening she continued to exhibit the chewing motions.  She is hard of hearing.  There is exotropia on the right.  She is completely edentulous.  Heart sounds are distant.  Breath sounds are markedly decreased.  Abdomen is protuberant.  Pedal pulses are decreased.  Her hands are large with minor arthritic changes.  General appearance: Adequately nourished; no acute distress, increased work of breathing is present.   Lymphatic: No lymphadenopathy about the head, neck, axilla. Eyes: No conjunctival inflammation  or lid edema is present. There is no scleral icterus. Ears:  External ear exam shows no  significant lesions or deformities.   Nose:  External nasal examination shows no deformity or inflammation. Nasal mucosa are pink and moist without lesions, exudates Neck:  No thyromegaly, masses, tenderness noted.    Heart:  No gallop, murmur, click, rub .  Lungs:  without wheezes, rhonchi, rales, rubs. Abdomen: Bowel sounds are normal. Abdomen is soft and nontender with no organomegaly, hernias, masses. GU: Deferred  Extremities:  No cyanosis, clubbing, edema  Neurologic exam :Balance, Rhomberg, finger to nose testing could not be completed due to clinical state Skin: Warm & dry w/o tenting. No significant rash.  See summary under each active problem in the Problem List with associated updated therapeutic plan

## 2022-08-21 NOTE — Patient Instructions (Signed)
See assessment and plan under each diagnosis in the problem list and acutely for this visit 

## 2022-08-21 NOTE — Assessment & Plan Note (Addendum)
Today she denies constipation and describes diarrhea but could not characterize it due to her dementia.

## 2022-08-21 NOTE — Assessment & Plan Note (Signed)
She can provide no meaningful history at this time.

## 2022-08-21 NOTE — Assessment & Plan Note (Signed)
No seizures reported by staff on present regimen.  Neurology follow-up as scheduled.

## 2022-08-22 NOTE — Assessment & Plan Note (Signed)
BP controlled; no change in antihypertensive medication, low dose HCTZ

## 2022-09-26 ENCOUNTER — Encounter: Payer: Self-pay | Admitting: Adult Health

## 2022-09-26 ENCOUNTER — Non-Acute Institutional Stay (SKILLED_NURSING_FACILITY): Payer: Medicare PPO | Admitting: Adult Health

## 2022-09-26 DIAGNOSIS — K5909 Other constipation: Secondary | ICD-10-CM

## 2022-09-26 DIAGNOSIS — J410 Simple chronic bronchitis: Secondary | ICD-10-CM | POA: Diagnosis not present

## 2022-09-26 DIAGNOSIS — G20A1 Parkinson's disease without dyskinesia, without mention of fluctuations: Secondary | ICD-10-CM

## 2022-09-26 DIAGNOSIS — I10 Essential (primary) hypertension: Secondary | ICD-10-CM | POA: Diagnosis not present

## 2022-09-26 DIAGNOSIS — D696 Thrombocytopenia, unspecified: Secondary | ICD-10-CM

## 2022-09-26 DIAGNOSIS — G40909 Epilepsy, unspecified, not intractable, without status epilepticus: Secondary | ICD-10-CM

## 2022-09-26 DIAGNOSIS — E785 Hyperlipidemia, unspecified: Secondary | ICD-10-CM

## 2022-09-26 DIAGNOSIS — G8929 Other chronic pain: Secondary | ICD-10-CM

## 2022-09-26 DIAGNOSIS — F03918 Unspecified dementia, unspecified severity, with other behavioral disturbance: Secondary | ICD-10-CM | POA: Diagnosis not present

## 2022-09-26 DIAGNOSIS — R52 Pain, unspecified: Secondary | ICD-10-CM

## 2022-09-26 NOTE — Progress Notes (Signed)
Location:  [facility] Room Number: 116-D Place of Service:  SNF (31)   CODE STATUS: Full Code  No Known Allergies  Chief Complaint  Patient presents with   Annual Exam    HPI:  She is a 88 year old long term resident of this facility being seen for her annual exam. She has not been hospitalized over the past year. She has not had any ED visits. Her weight has remained stable. There are no reports of uncontrolled pain. No reports of anxiety or depressive thoughts. She continues to be followed for her chronic illnesses including:  Unspecified hyperlipidemia: Morbid obesity:  Unspecified obsessive compulsive disorder:  Parkinson's disease:  Chronic generalized pain: will continue tylenol 650 mg twice daily    Past Medical History:  Diagnosis Date   Diverticulosis OCT 2010 TCS   SIGMOID COLON   Esophageal web 2010   DILATION 16 MM   Gastritis, Helicobacter pylori 2010   Rx. ABO for 10 days    GERD (gastroesophageal reflux disease)    Hip fracture, left (HCC)    2016   Hyperlipidemia    Hypertension    Restless legs syndrome    Seizures (HCC) January 2010   Post head trauma in fall .sees Dr Gerilyn Pilgrim    Past Surgical History:  Procedure Laterality Date   ABDOMINAL HYSTERECTOMY     COLONOSCOPY  OCT 2010   TORTUOUS, Sml IH, Tics, MULTIPLE SIMPLE ADENOMAS (<6MM)   HIP ARTHROPLASTY Left 11/19/2014   Procedure: LEFT PARTIAL HIP REPLACEMENT;  Surgeon: Vickki Hearing, MD;  Location: [facility];  Service: Orthopedics;  Laterality: Left;   ORIF HIP FRACTURE  04/05/2012   Procedure: OPEN REDUCTION INTERNAL FIXATION HIP;  Surgeon: Vickki Hearing, MD;  Location: [facility];  Service: Orthopedics;  Laterality: Right;   UPPER GASTROINTESTINAL ENDOSCOPY  AUG 2010   SAVARY   VESICOVAGINAL FISTULA CLOSURE W/ TAH  1966   bleeding     Social History   Socioeconomic History   Marital status: Widowed    Spouse name: Not on file   Number of children: Not on file    Years of education: Not on file   Highest education level: Not on file  Occupational History   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 agent    Occupation: retired   Tobacco Use   Smoking status: Never   Smokeless tobacco: Never  Building services engineer Use: Never used  Substance and Sexual Activity   Alcohol use: No   Drug use: No   Sexual activity: Not Currently  Other Topics Concern   Not on file  Social History Narrative   Long term resident of [facility]    Social Determinants of Health   Financial Resource Strain: Low Risk  (07/14/2018)   Overall Financial Resource Strain (CARDIA)    Difficulty of Paying Living Expenses: Not hard at all  Food Insecurity: No Food Insecurity (07/14/2018)   Hunger Vital Sign    Worried About Running Out of Food in the Last Year: Never true    Ran Out of Food in the Last Year: Never true  Transportation Needs: No Transportation Needs (07/14/2018)   PRAPARE - Administrator, Civil Service (Medical): No    Lack of Transportation (Non-Medical): No  Physical Activity: Insufficiently Active (07/14/2018)   Exercise Vital Sign    Days of Exercise per Week: 4 days    Minutes of Exercise per Session: 20 min  Stress: Stress Concern  Present (07/14/2018)   [employer] of Occupational Health - Occupational Stress Questionnaire    Feeling of Stress : To some extent  Social Connections: Moderately Isolated (07/14/2018)   Social Connection and Isolation Panel [NHANES]    Frequency of Communication with Friends and Family: Twice a week    Frequency of Social Gatherings with Friends and Family: Once a week    Attends Religious Services: Never    Database administrator or Organizations: No    Attends Banker Meetings: Never    Marital Status: Widowed  Intimate Partner Violence: Not At Risk (07/14/2018)   Humiliation, Afraid, Rape, and Kick questionnaire    Fear of Current or Ex-Partner: No    Emotionally Abused: No     Physically Abused: No    Sexually Abused: No   Family History  Problem Relation Age of Onset   Cancer Mother        bladder    Diabetes Mother    Diabetes Father    Emphysema Father    GER disease Brother    Colon cancer Neg Hx    Colon polyps Neg Hx       VITAL SIGNS BP 130/75   Pulse 66   Temp 97.7 F (36.5 C)   Resp 18   Ht 5\' 7"  (1.702 m)   Wt 225 lb 12.8 oz (102.4 kg)   SpO2 96%   BMI 35.37 kg/m   Outpatient Encounter Medications as of 09/26/2022  Medication Sig   Acetaminophen (TYLENOL ARTHRITIS PAIN PO) Take 650 mg by mouth in the morning and at bedtime.   aspirin 81 MG chewable tablet Chew 81 mg by mouth daily.   bisacodyl (DULCOLAX) 10 MG suppository Place 10 mg rectally as needed.   calcium carbonate (TUMS - DOSED IN MG ELEMENTAL CALCIUM) 500 MG chewable tablet Chew 1 tablet by mouth 3 (three) times daily. DX age related osteoporosis without current pathological fracture 07/10/2018 per Dr.Gupta note   Carbidopa-Levodopa ER (SINEMET CR) 25-100 MG tablet controlled release Take 1 tablet by mouth 3 (three) times daily.   FLUoxetine (PROZAC) 40 MG capsule Take 40 mg by mouth daily.   fluticasone-salmeterol (ADVAIR) 100-50 MCG/ACT AEPB Inhale 1 puff into the lungs 2 (two) times daily. Special Instructions: RINSE MOUTH AFTER USE. DISCARD AFTER 1 MONTH   hydrochlorothiazide (HYDRODIURIL) 12.5 MG tablet Take 12.5 mg by mouth daily.   levETIRAcetam (KEPPRA) 250 MG tablet Take 250 mg by mouth 2 (two) times daily.   lovastatin (MEVACOR) 20 MG tablet Take 1 tablet (20 mg total) by mouth at bedtime.   Melatonin 5 MG TABS Take 5 mg by mouth at bedtime.   memantine (NAMENDA) 10 MG tablet Take 10 mg by mouth 2 (two) times daily.   NON FORMULARY Diet type: Regular   potassium chloride (K-DUR,KLOR-CON) 10 MEQ tablet Take 10 mEq by mouth daily.   sennosides-docusate sodium (SENOKOT-S) 8.6-50 MG tablet Take 1 tablet by mouth in the morning and at bedtime. For Constipation   zinc  oxide 20 % ointment Apply 1 Application topically 3 (three) times daily. Apply to sacrum, coccyx and bilateral buttocks   [DISCONTINUED] acetaminophen (TYLENOL) 650 MG CR tablet Take 650 mg by mouth 2 (two) times daily.   [DISCONTINUED] Balsam [place]-Castor Oil [facility]) OINT Special Instructions: Apply to sacrum, coccyx and bilateral buttocks qshift for prevention.   [DISCONTINUED] Magnesium Hydroxide (MILK OF MAGNESIA PO) Take by mouth. 30 ml by mouth daily as needed for constipation.   [DISCONTINUED]  Miconazole Nitrate 2 % POWD by Does not apply route. Apply to Redness in folds of skin as Needed   No facility-administered encounter medications on file as of 09/26/2022.     SIGNIFICANT DIAGNOSTIC EXAMS  PREVIOUS  09-07-19: DEXA: t score -1.06  01-07-22: chest x-ray: mild CHF    NO NEW EXAMS.   LABS REVIEWED PREVIOUS  10-09-21: wbc 5.5; hgb 12.2; hct 38.1; mcv 93.6 plt 142; glucose 72; bun 27; creat 0.71; k+ 3.4; na++ 143; ca 8.1; GFR>60; liver normal albumin 3.8; chol 133; ldl 84 trig 44; hdl 40 01-07-22: wbc 9.0; hgb 13.0; hct 40.9; mcv 91.7 plt 143; glucose 116; bun 20; creat 0.73 ;k+ 3.5; na++ 138; ca 8.6; GFR>60; d-dimer 0.94; CRP 10.5  01-15-22: CRP 0.7 02-01-22: hepatitis C nr   NO NEW LABS.   Review of Systems  Constitutional:  Negative for malaise/fatigue.  HENT: Negative.    Respiratory:  Negative for cough and shortness of breath.   Cardiovascular:  Negative for chest pain, palpitations and leg swelling.  Gastrointestinal:  Negative for abdominal pain, constipation and heartburn.  Musculoskeletal:  Negative for back pain, joint pain and myalgias.  Skin: Negative.   Neurological:  Negative for dizziness.  Psychiatric/Behavioral:  The patient is not nervous/anxious.    Physical Exam Constitutional:      General: She is not in acute distress.    Appearance: She is well-developed. She is obese. She is not diaphoretic.  HENT:     Right Ear: Tympanic membrane normal.      Left Ear: Tympanic membrane normal.     Nose: Nose normal.     Mouth/Throat:     Mouth: Mucous membranes are moist.     Pharynx: Oropharynx is clear.  Eyes:     Conjunctiva/sclera: Conjunctivae normal.  Neck:     Thyroid: No thyromegaly.  Cardiovascular:     Rate and Rhythm: Normal rate and regular rhythm.     Heart sounds: Normal heart sounds.  Pulmonary:     Effort: Pulmonary effort is normal. No respiratory distress.     Breath sounds: Normal breath sounds.  Abdominal:     General: Bowel sounds are normal. There is no distension.     Palpations: Abdomen is soft.     Tenderness: There is no abdominal tenderness.  Musculoskeletal:     Cervical back: Neck supple.     Right lower leg: No edema.     Left lower leg: No edema.     Comments: Is able to move all extremities 2016: left hip fracture left hip   Lymphadenopathy:     Cervical: No cervical adenopathy.  Skin:    General: Skin is warm and dry.  Neurological:     Mental Status: She is alert. Mental status is at baseline.  Psychiatric:        Mood and Affect: Mood normal.       ASSESSMENT/ PLAN:  TODAY:   Seizure disorder (after CHI) no recent seizures; will continue keppra 500 mg twice daily   2. Thrombocytopenia; plt 142 will monitor   3. Essential hypertension: b/p 122/78 will continue hctz 12.5 mg daily with k+ 10 meq daily   4. Late onset alzheimer's disease without behavioral disturbance weight is 230 pounds will continue namenda 5 mg twice daily     5. Simple chronic bronchitis: without recent flares: will continue advair 100/50 1 puff twice daily   6. Chronic constipation: will continue senna s daily   7. Hypokalemia: k+  3.4 will continue k+ 10 meq daily   8. Unspecified hyperlipidemia: LDL 89; will continue mevacor 20 mg daily   9. Morbid obesity: hyperlipidemia: BMI 36.05  10. Unspecified obsessive compulsive disorder: will continue prozac 40 mg daily (has failed 2 weans) melatonin 5 mg nightly    11. Parkinson's disease: will continue sinemet cr 25/100 mg three times daily   12. Chronic generalized pain: will continue tylenol 650 mg twice daily      Synthia Innocent NP [facility]   call [phone]

## 2022-10-04 ENCOUNTER — Other Ambulatory Visit (HOSPITAL_COMMUNITY)
Admission: RE | Admit: 2022-10-04 | Discharge: 2022-10-04 | Disposition: A | Discharge status: Home / Self Care | Payer: Medicare PPO | Source: Skilled Nursing Facility | Attending: Adult Health | Admitting: Adult Health

## 2022-10-04 DIAGNOSIS — I10 Essential (primary) hypertension: Secondary | ICD-10-CM | POA: Insufficient documentation

## 2022-10-04 LAB — COMPREHENSIVE METABOLIC PANEL
ALT: 10 U/L (ref 0–44)
AST: 18 U/L (ref 15–41)
Albumin: 3.3 g/dL — ABNORMAL LOW (ref 3.5–5.0)
Alkaline Phosphatase: 54 U/L (ref 38–126)
Anion gap: 6 (ref 5–15)
BUN: 21 mg/dL (ref 8–23)
CO2: 31 mmol/L (ref 22–32)
Calcium: 8.2 mg/dL — ABNORMAL LOW (ref 8.9–10.3)
Chloride: 106 mmol/L (ref 98–111)
Creatinine, Ser: 0.7 mg/dL (ref 0.44–1.00)
GFR, Estimated: >60 mL/min (ref >60)
Glucose, Bld: 75 mg/dL (ref 70–99)
Potassium: 3.6 mmol/L (ref 3.5–5.1)
Sodium: 143 mmol/L (ref 135–145)
Total Bilirubin: 0.4 mg/dL (ref 0.3–1.2)
Total Protein: 6 g/dL — ABNORMAL LOW (ref 6.5–8.1)

## 2022-10-04 LAB — CBC
HCT: 37.9 % (ref 36.0–46.0)
Hemoglobin: 12 g/dL (ref 12.0–15.0)
MCH: 29.9 pg (ref 26.0–34.0)
MCHC: 31.7 g/dL (ref 30.0–36.0)
MCV: 94.5 fL (ref 80.0–100.0)
Platelets: 157 10*3/uL (ref 150–400)
RBC: 4.01 MIL/uL (ref 3.87–5.11)
RDW: 14.5 % (ref 11.5–15.5)
WBC: 4.6 10*3/uL (ref 4.0–10.5)
nRBC: 0 % (ref 0.0–0.2)

## 2022-10-04 LAB — LIPID PANEL
Cholesterol: 137 mg/dL (ref 0–200)
HDL: 40 mg/dL — ABNORMAL LOW (ref >40)
LDL Cholesterol: 86 mg/dL (ref 0–99)
Total CHOL/HDL Ratio: 3.4 RATIO
Triglycerides: 55 mg/dL (ref <150)
VLDL: 11 mg/dL (ref 0–40)

## 2022-10-05 ENCOUNTER — Non-Acute Institutional Stay (SKILLED_NURSING_FACILITY): Payer: Medicare PPO | Admitting: Adult Health

## 2022-10-05 ENCOUNTER — Encounter: Payer: Self-pay | Admitting: Adult Health

## 2022-10-05 DIAGNOSIS — G40909 Epilepsy, unspecified, not intractable, without status epilepticus: Secondary | ICD-10-CM | POA: Diagnosis not present

## 2022-10-05 DIAGNOSIS — F03918 Unspecified dementia, unspecified severity, with other behavioral disturbance: Secondary | ICD-10-CM

## 2022-10-05 DIAGNOSIS — D696 Thrombocytopenia, unspecified: Secondary | ICD-10-CM | POA: Diagnosis not present

## 2022-10-05 NOTE — Progress Notes (Signed)
Location:  [facility] Room Number: 116-D Place of Service:  SNF (31)   CODE STATUS: Full Code  No Known Allergies  Chief Complaint  Patient presents with   Care Plan Meeting    HPI:  We have come together for her care plan meeting. Family present BIMS 7/15 mood 3/30. She is non ambulatory with one fall without injury. She requires max to dependent for her adls. She is frequently incontinent of bladder and bowel. Dietary:  regular diet: weight is 225.8 pounds; appetite 25-100%; feeds self after set up. Therapy: none at this time. Activities: very involved. We have discussed her advanced directives. Her son would like to fill out the MOST form with his siblings before making any decisions. She will continue to be followed for her chronic illnesses including: Thrombocytopenia Seizure disorder  Dementia with behavioral disorder  Past Medical History:  Diagnosis Date   Diverticulosis OCT 2010 TCS   SIGMOID COLON   Esophageal web 2010   DILATION 16 MM   Gastritis, Helicobacter pylori 2010   Rx. ABO for 10 days    GERD (gastroesophageal reflux disease)    Hip fracture, left (HCC)    2016   Hyperlipidemia    Hypertension    Restless legs syndrome    Seizures (HCC) January 2010   Post head trauma in fall .sees Dr Gerilyn Pilgrim    Past Surgical History:  Procedure Laterality Date   ABDOMINAL HYSTERECTOMY     COLONOSCOPY  OCT 2010   TORTUOUS, Sml IH, Tics, MULTIPLE SIMPLE ADENOMAS (<6MM)   HIP ARTHROPLASTY Left 11/19/2014   Procedure: LEFT PARTIAL HIP REPLACEMENT;  Surgeon: Vickki Hearing, MD;  Location: [facility];  Service: Orthopedics;  Laterality: Left;   ORIF HIP FRACTURE  04/05/2012   Procedure: OPEN REDUCTION INTERNAL FIXATION HIP;  Surgeon: Vickki Hearing, MD;  Location: [facility];  Service: Orthopedics;  Laterality: Right;   UPPER GASTROINTESTINAL ENDOSCOPY  AUG 2010   SAVARY   VESICOVAGINAL FISTULA CLOSURE W/ TAH  1966   bleeding     Social History    Socioeconomic History   Marital status: Widowed    Spouse name: Not on file   Number of children: Not on file   Years of education: Not on file   Highest education level: Not on file  Occupational History   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 agent    Occupation: retired   Tobacco Use   Smoking status: Never   Smokeless tobacco: Never  Building services engineer Use: Never used  Substance and Sexual Activity   Alcohol use: No   Drug use: No   Sexual activity: Not Currently  Other Topics Concern   Not on file  Social History Narrative   Long term resident of [facility]    Social Determinants of Health   Financial Resource Strain: Low Risk  (07/14/2018)   Overall Financial Resource Strain (CARDIA)    Difficulty of Paying Living Expenses: Not hard at all  Food Insecurity: No Food Insecurity (07/14/2018)   Hunger Vital Sign    Worried About Running Out of Food in the Last Year: Never true    Ran Out of Food in the Last Year: Never true  Transportation Needs: No Transportation Needs (07/14/2018)   PRAPARE - Administrator, Civil Service (Medical): No    Lack of Transportation (Non-Medical): No  Physical Activity: Insufficiently Active (07/14/2018)   Exercise Vital Sign    Days of Exercise per  Week: 4 days    Minutes of Exercise per Session: 20 min  Stress: Stress Concern Present (07/14/2018)   [employer] of Occupational Health - Occupational Stress Questionnaire    Feeling of Stress : To some extent  Social Connections: Moderately Isolated (07/14/2018)   Social Connection and Isolation Panel [NHANES]    Frequency of Communication with Friends and Family: Twice a week    Frequency of Social Gatherings with Friends and Family: Once a week    Attends Religious Services: Never    Database administrator or Organizations: No    Attends Banker Meetings: Never    Marital Status: Widowed  Intimate Partner Violence: Not At Risk (07/14/2018)    Humiliation, Afraid, Rape, and Kick questionnaire    Fear of Current or Ex-Partner: No    Emotionally Abused: No    Physically Abused: No    Sexually Abused: No   Family History  Problem Relation Age of Onset   Cancer Mother        bladder    Diabetes Mother    Diabetes Father    Emphysema Father    GER disease Brother    Colon cancer Neg Hx    Colon polyps Neg Hx       VITAL SIGNS BP (!) 142/81   Pulse 70   Temp 98.1 F (36.7 C)   Resp 18   Ht 5\' 7"  (1.702 m)   Wt 225 lb 12.8 oz (102.4 kg)   SpO2 98%   BMI 35.37 kg/m   Outpatient Encounter Medications as of 10/05/2022  Medication Sig   Acetaminophen (TYLENOL ARTHRITIS PAIN PO) Take 650 mg by mouth in the morning and at bedtime.   aspirin 81 MG chewable tablet Chew 81 mg by mouth daily.   bisacodyl (DULCOLAX) 10 MG suppository Place 10 mg rectally as needed.   calcium carbonate (TUMS - DOSED IN MG ELEMENTAL CALCIUM) 500 MG chewable tablet Chew 1 tablet by mouth 3 (three) times daily. DX age related osteoporosis without current pathological fracture 07/10/2018 per Dr.Gupta note   Carbidopa-Levodopa ER (SINEMET CR) 25-100 MG tablet controlled release Take 1 tablet by mouth 3 (three) times daily.   FLUoxetine (PROZAC) 40 MG capsule Take 40 mg by mouth daily.   fluticasone-salmeterol (ADVAIR) 100-50 MCG/ACT AEPB Inhale 1 puff into the lungs 2 (two) times daily. Special Instructions: RINSE MOUTH AFTER USE. DISCARD AFTER 1 MONTH   hydrochlorothiazide (HYDRODIURIL) 12.5 MG tablet Take 12.5 mg by mouth daily.   levETIRAcetam (KEPPRA) 250 MG tablet Take 250 mg by mouth 2 (two) times daily.   lovastatin (MEVACOR) 20 MG tablet Take 1 tablet (20 mg total) by mouth at bedtime.   Melatonin 5 MG TABS Take 5 mg by mouth at bedtime.   memantine (NAMENDA) 10 MG tablet Take 10 mg by mouth 2 (two) times daily.   NON FORMULARY Diet type: Regular   potassium chloride (K-DUR,KLOR-CON) 10 MEQ tablet Take 10 mEq by mouth daily.    sennosides-docusate sodium (SENOKOT-S) 8.6-50 MG tablet Take 1 tablet by mouth in the morning and at bedtime. For Constipation   zinc oxide 20 % ointment Apply 1 Application topically 3 (three) times daily. Apply to sacrum, coccyx and bilateral buttocks   No facility-administered encounter medications on file as of 10/05/2022.     SIGNIFICANT DIAGNOSTIC EXAMS  PREVIOUS  09-07-19: DEXA: t score -1.06  01-07-22: chest x-ray: mild CHF    NO NEW EXAMS.   LABS REVIEWED PREVIOUS  10-09-21: wbc 5.5; hgb 12.2; hct 38.1; mcv 93.6 plt 142; glucose 72; bun 27; creat 0.71; k+ 3.4; na++ 143; ca 8.1; GFR>60; liver normal albumin 3.8; chol 133; ldl 84 trig 44; hdl 40 01-07-22: wbc 9.0; hgb 13.0; hct 40.9; mcv 91.7 plt 143; glucose 116; bun 20; creat 0.73 ;k+ 3.5; na++ 138; ca 8.6; GFR>60; d-dimer 0.94; CRP 10.5  01-15-22: CRP 0.7 02-01-22: hepatitis C nr   NO NEW LABS.   Review of Systems  Constitutional:  Negative for malaise/fatigue.  Respiratory:  Negative for cough and shortness of breath.   Cardiovascular:  Negative for chest pain, palpitations and leg swelling.  Gastrointestinal:  Negative for abdominal pain, constipation and heartburn.  Musculoskeletal:  Negative for back pain, joint pain and myalgias.  Skin: Negative.   Neurological:  Negative for dizziness.  Psychiatric/Behavioral:  The patient is not nervous/anxious.    Physical Exam Constitutional:      General: She is not in acute distress.    Appearance: She is well-developed. She is obese. She is not diaphoretic.  Neck:     Thyroid: No thyromegaly.  Cardiovascular:     Rate and Rhythm: Normal rate and regular rhythm.     Pulses: Normal pulses.     Heart sounds: Normal heart sounds.  Pulmonary:     Effort: Pulmonary effort is normal. No respiratory distress.     Breath sounds: Normal breath sounds.  Abdominal:     General: Bowel sounds are normal. There is no distension.     Palpations: Abdomen is soft.     Tenderness: There  is no abdominal tenderness.  Musculoskeletal:     Cervical back: Neck supple.     Right lower leg: No edema.     Left lower leg: No edema.     Comments:  Is able to move all extremities 2016: left hip fracture left hip    Lymphadenopathy:     Cervical: No cervical adenopathy.  Skin:    General: Skin is warm and dry.  Neurological:     Mental Status: She is alert. Mental status is at baseline.  Psychiatric:        Mood and Affect: Mood normal.      ASSESSMENT/ PLAN:  TODAY  Thrombocytopenia Seizure disorder Dementia with behavioral disorder  Will continue current medications Will continue current plan of care Will continue to monitor her status.   Time spent with patient: 40 minutes:(20 minutes spent with advanced directives; MOST form given to family) medications; dietary; plan of care.    Synthia Innocent NP [facility]   call [phone]

## 2022-11-01 ENCOUNTER — Encounter: Payer: Self-pay | Admitting: Adult Health

## 2022-11-01 ENCOUNTER — Non-Acute Institutional Stay (SKILLED_NURSING_FACILITY): Payer: Medicare PPO | Admitting: Adult Health

## 2022-11-01 DIAGNOSIS — I10 Essential (primary) hypertension: Secondary | ICD-10-CM

## 2022-11-01 DIAGNOSIS — F03918 Unspecified dementia, unspecified severity, with other behavioral disturbance: Secondary | ICD-10-CM

## 2022-11-01 DIAGNOSIS — G40909 Epilepsy, unspecified, not intractable, without status epilepticus: Secondary | ICD-10-CM

## 2022-11-01 DIAGNOSIS — D696 Thrombocytopenia, unspecified: Secondary | ICD-10-CM

## 2022-11-01 NOTE — Progress Notes (Signed)
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 There are no reports of uncontrolled pain. Her weight is stable. No reports of anxiety or depressive thoughts.   Past Medical History:  Diagnosis Date   Diverticulosis OCT 2010 TCS   SIGMOID COLON   Esophageal web 2010   DILATION 16 MM   Gastritis, Helicobacter pylori 7989   Rx. ABO for 10 days    GERD (gastroesophageal reflux disease)    Hip fracture, left ([place])    2016   Hyperlipidemia    Hypertension    Restless legs syndrome    Seizures ([place]) January 2010   Post head trauma in fall .sees [name]    Past Surgical History:  Procedure Laterality Date   ABDOMINAL HYSTERECTOMY     COLONOSCOPY  OCT 2010   TORTUOUS, Sml IH, Tics, MULTIPLE SIMPLE ADENOMAS (<6MM)   HIP ARTHROPLASTY Left 11/19/2014   Procedure: LEFT PARTIAL HIP REPLACEMENT;  Surgeon: [name], MD;  Location: [facility];  Service: Orthopedics;  Laterality: Left;   ORIF HIP FRACTURE  04/05/2012   Procedure: OPEN REDUCTION INTERNAL FIXATION HIP;  Surgeon: [name], MD;  Location: [facility];  Service: Orthopedics;  Laterality: Right;   UPPER GASTROINTESTINAL ENDOSCOPY  AUG 2010   SAVARY   VESICOVAGINAL FISTULA CLOSURE W/ TAH  1966   bleeding     Social History   Socioeconomic History   Marital status: Widowed    Spouse name: Not on file   Number of children: Not on file   Years of education: Not on  file   Highest education level: Not on file  Occupational History   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Market researcher    Occupation: retired   Tobacco Use   Smoking status: Never   Smokeless tobacco: Never  Scientific laboratory technician Use: Never used  Substance and Sexual Activity   Alcohol use: No   Drug use: No   Sexual activity: Not Currently  Other Topics Concern   Not on file  Social History Narrative   [facility] resident of [facility]    Social Determinants of Health   Financial Resource Strain: [place]  (07/14/2018)   Overall Financial Resource Strain (CARDIA)    Difficulty of Paying Living Expenses: Not hard at all  Food Insecurity: No Food Insecurity (07/14/2018)   Hunger Vital Sign    Worried About Running Out of Food in the Last Year: Never true    [place] in the Last Year: Never true  Transportation Needs: No Transportation Needs (07/14/2018)   PRAPARE - Hydrologist (Medical): No    Lack of Transportation (Non-Medical): No  Physical Activity: Insufficiently Active (07/14/2018)   Exercise Vital Sign    Days of Exercise per Week: 4 days    Minutes of Exercise per Session: 20 min  Stress: Stress Concern Present (07/14/2018)   [place]  [employer] - Occupational Stress Questionnaire    Feeling of Stress : To some extent  Social Connections: Moderately Isolated (07/14/2018)   Social Connection and Isolation Panel [NHANES]    Frequency of Communication with Friends and Family: Twice a week    Frequency of Social Gatherings with Friends and Family: Once a week    Attends Religious Services: Never    Marine scientist or Organizations: No    Attends Archivist Meetings: Never    Marital Status: Widowed  Intimate Partner Violence: Not At Risk (07/14/2018)   Humiliation, Afraid, Rape, and Kick questionnaire    Fear of Current or Ex-Partner: No    Emotionally Abused: No    Physically Abused: No    Sexually  Abused: No   Family History  Problem Relation Age of Onset   Cancer Mother        bladder    Diabetes Mother    Diabetes Father    Emphysema Father    GER disease Brother    Colon cancer Neg Hx    Colon polyps Neg Hx       VITAL SIGNS BP 139/64   Pulse 92   Temp 97.6 F (36.4 C)   Ht 5\' 7"  (1.702 m)   Wt 226 lb (102.5 kg)   BMI 35.40 kg/m   Outpatient Encounter Medications as of 11/01/2022  Medication Sig   Acetaminophen (TYLENOL ARTHRITIS PAIN PO) Take 650 mg by mouth in the morning and at bedtime.   aspirin 81 MG chewable tablet Chew 81 mg by mouth daily.   bisacodyl (DULCOLAX) 10 MG suppository Place 10 mg rectally as needed.   calcium carbonate (TUMS - DOSED IN MG ELEMENTAL CALCIUM) 500 MG chewable tablet Chew 1 tablet by mouth 3 (three) times daily. DX age related osteoporosis without current pathological fracture 07/10/2018 per Dr.Gupta note   Carbidopa-Levodopa ER (SINEMET CR) 25-100 MG tablet controlled release Take 1 tablet by mouth 3 (three) times daily.   FLUoxetine (PROZAC) 40 MG capsule Take 40 mg by mouth daily.   fluticasone-salmeterol (ADVAIR) 100-50 MCG/ACT AEPB Inhale 1 puff into the lungs 2 (two) times daily. Special Instructions: RINSE MOUTH AFTER USE. DISCARD AFTER 1 MONTH   hydrochlorothiazide (HYDRODIURIL) 12.5 MG tablet Take 12.5 mg by mouth daily.   levETIRAcetam (KEPPRA) 250 MG tablet Take 250 mg by mouth 2 (two) times daily.   lovastatin (MEVACOR) 20 MG tablet Take 1 tablet (20 mg total) by mouth at bedtime.   Melatonin 5 MG TABS Take 5 mg by mouth at bedtime.   memantine (NAMENDA) 10 MG tablet Take 10 mg by mouth 2 (two) times daily.   NON FORMULARY Diet type: Regular   potassium chloride (K-DUR,KLOR-CON) 10 MEQ tablet Take 10 mEq by mouth daily.   sennosides-docusate sodium (SENOKOT-S) 8.6-50 MG tablet Take 1 tablet by mouth in the morning and at bedtime. For Constipation   zinc oxide 20 % ointment Apply 1 Application topically 3 (three) times  daily. Apply to sacrum, coccyx and bilateral buttocks   No facility-administered encounter medications on file as of 11/01/2022.     SIGNIFICANT DIAGNOSTIC EXAMS  PREVIOUS  09-07-19: DEXA: t score -1.06  01-07-22: chest x-ray: mild CHF    NO NEW EXAMS.   LABS REVIEWED PREVIOUS  10-09-21: wbc 5.5; hgb 12.2; hct 38.1; mcv 93.6 plt 142; glucose 72; bun 27; creat 0.71; k+ 3.4; na++ 143; ca 8.1; GFR>60; liver normal albumin 3.8; chol 133; ldl 84 trig  44; hdl 40 01-07-22: wbc 9.0; hgb 13.0; hct 40.9; mcv 91.7 plt 143; glucose 116; bun 20; creat 0.73 ;k+ 3.5; na++ 138; ca 8.6; GFR>60; d-dimer 0.94; CRP 10.5  01-15-22: CRP 0.7 02-01-22: hepatitis C nr   TODAY  10-04-22: wbc 4.6; hgb 12.0; hct 37.9; mcv 94.5 plt 157; glucose 75; bun 21; creat 0.70; k+ 3.6; na++ 143; ca 8.2 gfr >60; protein 6.0; albumin 3.3 chol 137; ldl 86; trig 55; hdl 40   Review of Systems  Constitutional:  Negative for malaise/fatigue.  Respiratory:  Negative for cough and shortness of breath.   Cardiovascular:  Negative for chest pain, palpitations and leg swelling.  Gastrointestinal:  Negative for abdominal pain, constipation and heartburn.  Musculoskeletal:  Negative for back pain, joint pain and myalgias.  Skin: Negative.   Neurological:  Negative for dizziness.  Psychiatric/Behavioral:  The patient is not nervous/anxious.    Physical Exam Constitutional:      General: She is not in acute distress.    Appearance: She is well-developed. She is obese. She is not diaphoretic.  Neck:     Thyroid: No thyromegaly.  Cardiovascular:     Rate and Rhythm: Normal rate and regular rhythm.     Pulses: Normal pulses.     Heart sounds: Normal heart sounds.  Pulmonary:     Effort: Pulmonary effort is normal. No respiratory distress.     Breath sounds: Normal breath sounds.  Abdominal:     General: Bowel sounds are normal. There is no distension.     Palpations: Abdomen is soft.     Tenderness: There is no abdominal  tenderness.  Musculoskeletal:     Cervical back: Neck supple.     Right lower leg: No edema.     Left lower leg: No edema.     Comments:  Is able to move all extremities  Lymphadenopathy:     Cervical: No cervical adenopathy.  Skin:    General: Skin is warm and dry.  Neurological:     Mental Status: She is alert. Mental status is at baseline.  Psychiatric:        Mood and Affect: Mood normal.        ASSESSMENT/ PLAN:  TODAY:   Seizure disorder (after CHI) no recent seizures: will continue keppra 500 mg twice daily   2. Thrombocytopenia: plt 142  3. Essential hypertension: b/p 139/64 will continue hctz 12.5 mg daily with k+ 10 meq dail y  4. Late onset alzheimer's disease without behavioral disturbance: weight is 226 pounds will continue namenda 5 mg twice daily   PREVIOUS     5. Simple chronic bronchitis: without recent flares: will continue advair 100/50 1 puff twice daily   6. Chronic constipation: will continue senna s daily   7. Hypokalemia: k+ 3.4 will continue k+ 10 meq daily   8. Unspecified hyperlipidemia: LDL 89; will continue mevacor 20 mg daily   9. Morbid obesity: hyperlipidemia: BMI 36.05  10. Unspecified obsessive compulsive disorder: will continue prozac 40 mg daily (has failed 2 weans) melatonin 5 mg nightly   11. Parkinson's disease: will continue sinemet cr 25/100 mg three times daily   12. Chronic generalized pain: will continue tylenol 650 mg twice daily        Synthia Innocent NP [facility]   call [phone]

## 2022-11-20 ENCOUNTER — Encounter: Payer: Self-pay | Admitting: Adult Health

## 2022-11-20 ENCOUNTER — Non-Acute Institutional Stay (SKILLED_NURSING_FACILITY): Payer: Medicare PPO | Admitting: Adult Health

## 2022-11-20 DIAGNOSIS — R634 Abnormal weight loss: Secondary | ICD-10-CM | POA: Diagnosis not present

## 2022-11-20 NOTE — Progress Notes (Signed)
Location:  [place] Room Number: 116-D Place of Service:  SNF (31)   CODE STATUS: Full Code  No Known Allergies  Chief Complaint  Patient presents with   Acute Visit    Patient concerns    HPI:  Staff is reporting several issues. She is "seeing people and talking to herself". Her appetite has declined and is not eating at times. She is not attending activities like she normally does. Today she denies seeing people who are not there. She tells me that she eats what she wants.   Past Medical History:  Diagnosis Date   Diverticulosis OCT 2010 TCS   SIGMOID COLON   Esophageal web 2010   DILATION 16 MM   Gastritis, Helicobacter pylori [id number]   Rx. ABO for 10 days    GERD (gastroesophageal reflux disease)    Hip fracture, left ([place])    2016   Hyperlipidemia    Hypertension    Restless legs syndrome    Seizures ([place]) January 2010   Post head trauma in fall .sees [name]    Past Surgical History:  Procedure Laterality Date   ABDOMINAL HYSTERECTOMY     COLONOSCOPY  OCT 2010   TORTUOUS, Sml IH, Tics, MULTIPLE SIMPLE ADENOMAS (<6MM)   HIP ARTHROPLASTY Left 11/19/2014   Procedure: LEFT PARTIAL HIP REPLACEMENT;  Surgeon: [name], MD;  Location: [facility];  Service: Orthopedics;  Laterality: Left;   ORIF HIP FRACTURE  04/05/2012   Procedure: OPEN REDUCTION INTERNAL FIXATION HIP;  Surgeon: [name], MD;  Location: [facility];  Service: Orthopedics;  Laterality: Right;   UPPER GASTROINTESTINAL ENDOSCOPY  AUG 2010   SAVARY   VESICOVAGINAL FISTULA CLOSURE W/ TAH  1966   bleeding     Social History   Socioeconomic History   Marital status: Widowed    Spouse name: Not on file   Number of children: Not on file   Years of education: Not on file   Highest education level: Not on file  Occupational History   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Market researcher    Occupation: retired   Tobacco Use   Smoking status: Never   Smokeless  tobacco: Never  Scientific laboratory technician Use: Never used  Substance and Sexual Activity   Alcohol use: No   Drug use: No   Sexual activity: Not Currently  Other Topics Concern   Not on file  Social History Narrative   Long term resident of [facility]    Social Determinants of Health   Financial Resource Strain: [place]  (07/14/2018)   Overall Financial Resource Strain (CARDIA)    Difficulty of Paying Living Expenses: Not hard at all  Food Insecurity: No Food Insecurity (07/14/2018)   Hunger Vital Sign    Worried About Running Out of Food in the Last Year: Never true    [place] in the Last Year: Never true  Transportation Needs: No Transportation Needs (07/14/2018)   PRAPARE - Hydrologist (Medical): No    Lack of Transportation (Non-Medical): No  Physical Activity: Insufficiently Active (07/14/2018)   Exercise Vital Sign    Days of Exercise per Week: 4 days    Minutes of Exercise per Session: 20 min  Stress: Stress Concern Present (07/14/2018)   [place]    Feeling of Stress : To some extent  Social Connections: Moderately Isolated (07/14/2018)   Social Connection  and Isolation Panel [NHANES]    Frequency of Communication with Friends and Family: Twice a week    Frequency of Social Gatherings with Friends and Family: Once a week    Attends Religious Services: Never    Marine scientist or Organizations: No    Attends Archivist Meetings: Never    Marital Status: Widowed  Intimate Partner Violence: Not At Risk (07/14/2018)   Humiliation, Afraid, Rape, and Kick questionnaire    Fear of Current or Ex-Partner: No    Emotionally Abused: No    Physically Abused: No    Sexually Abused: No   Family History  Problem Relation Age of Onset   Cancer Mother        bladder    Diabetes Mother    Diabetes Father    Emphysema Father    GER disease Brother    Colon cancer Neg Hx     Colon polyps Neg Hx       VITAL SIGNS BP 123/61   Pulse 72   Temp 98 F (36.7 C)   Resp 20   Ht 5' 7"$  (1.702 m)   Wt 218 lb 3.2 oz (99 kg)   SpO2 95%   BMI 34.17 kg/m   Outpatient Encounter Medications as of 11/20/2022  Medication Sig   Acetaminophen (TYLENOL ARTHRITIS PAIN PO) Take 650 mg by mouth in the morning and at bedtime.   aspirin 81 MG chewable tablet Chew 81 mg by mouth daily.   bisacodyl (DULCOLAX) 10 MG suppository Place 10 mg rectally as needed.   calcium carbonate (TUMS - DOSED IN MG ELEMENTAL CALCIUM) 500 MG chewable tablet Chew 1 tablet by mouth 3 (three) times daily. DX age related osteoporosis without current pathological fracture 07/10/2018 per Dr.Gupta note   Carbidopa-Levodopa ER (SINEMET CR) 25-100 MG tablet controlled release Take 1 tablet by mouth 3 (three) times daily.   FLUoxetine (PROZAC) 40 MG capsule Take 40 mg by mouth daily.   fluticasone-salmeterol (ADVAIR) 100-50 MCG/ACT AEPB Inhale 1 puff into the lungs 2 (two) times daily. Special Instructions: RINSE MOUTH AFTER USE. DISCARD AFTER 1 MONTH   hydrochlorothiazide (HYDRODIURIL) 12.5 MG tablet Take 12.5 mg by mouth daily.   levETIRAcetam (KEPPRA) 250 MG tablet Take 250 mg by mouth 2 (two) times daily.   Melatonin 5 MG TABS Take 5 mg by mouth at bedtime.   memantine (NAMENDA) 10 MG tablet Take 10 mg by mouth 2 (two) times daily.   mirtazapine (REMERON) 7.5 MG tablet Take 7.5 mg by mouth at bedtime. for appetite   NON FORMULARY Diet type: Regular   potassium chloride (K-DUR,KLOR-CON) 10 MEQ tablet Take 10 mEq by mouth daily.   sennosides-docusate sodium (SENOKOT-S) 8.6-50 MG tablet Take 1 tablet by mouth in the morning and at bedtime. For Constipation   zinc oxide 20 % ointment Apply 1 Application topically 3 (three) times daily. Apply to sacrum, coccyx and bilateral buttocks   lovastatin (MEVACOR) 20 MG tablet Take 1 tablet (20 mg total) by mouth at bedtime. (Patient not taking: Reported on 11/20/2022)    No facility-administered encounter medications on file as of 11/20/2022.     SIGNIFICANT DIAGNOSTIC EXAMS  PREVIOUS  09-07-19: DEXA: t score -1.06  01-07-22: chest x-ray: mild CHF    NO NEW EXAMS.   LABS REVIEWED PREVIOUS  01-07-22: wbc 9.0; hgb 13.0; hct 40.9; mcv 91.7 plt 143; glucose 116; bun 20; creat 0.73 ;k+ 3.5; na++ 138; ca 8.6; GFR>60; d-dimer 0.94; CRP 10.5  01-15-22: CRP 0.7 02-01-22: hepatitis C nr  10-04-22: wbc 4.6; hgb 12.0; hct 37.9; mcv 94.5 plt 157; glucose 75; bun 21; creat 0.70; k+ 3.6; na++ 143; ca 8.2 gfr >60; protein 6.0; albumin 3.3 chol 137; ldl 86; trig 55; hdl 40  NO NEW LABS.    Review of Systems  Constitutional:  Negative for malaise/fatigue.  Respiratory:  Negative for cough and shortness of breath.   Cardiovascular:  Negative for chest pain, palpitations and leg swelling.  Gastrointestinal:  Negative for abdominal pain, constipation and heartburn.  Musculoskeletal:  Negative for back pain, joint pain and myalgias.  Skin: Negative.   Neurological:  Negative for dizziness.  Psychiatric/Behavioral:  The patient is not nervous/anxious.     Physical Exam Constitutional:      General: She is not in acute distress.    Appearance: She is well-developed. She is obese. She is not diaphoretic.  Neck:     Thyroid: No thyromegaly.  Cardiovascular:     Rate and Rhythm: Normal rate and regular rhythm.     Pulses: Normal pulses.     Heart sounds: Normal heart sounds.  Pulmonary:     Effort: Pulmonary effort is normal. No respiratory distress.     Breath sounds: Normal breath sounds.  Abdominal:     General: Bowel sounds are normal. There is no distension.     Palpations: Abdomen is soft.     Tenderness: There is no abdominal tenderness.  Musculoskeletal:     Cervical back: Neck supple.     Right lower leg: No edema.     Left lower leg: No edema.     Comments:  Is able to move all extremities   Lymphadenopathy:     Cervical: No cervical adenopathy.   Skin:    General: Skin is warm and dry.  Neurological:     Mental Status: She is alert. Mental status is at baseline.  Psychiatric:        Mood and Affect: Mood normal.       ASSESSMENT/ PLAN:  TODAY  Weight loss: will begin remeron 7.5 mg nightly through 12-19-22; will stop mevacor; will check tsh    Ok Edwards NP [facility]  call [phone]

## 2022-11-22 ENCOUNTER — Other Ambulatory Visit (HOSPITAL_COMMUNITY)
Admission: RE | Admit: 2022-11-22 | Discharge: 2022-11-22 | Disposition: A | Discharge status: Home / Self Care | Payer: Medicare PPO | Source: Skilled Nursing Facility | Attending: Adult Health | Admitting: Adult Health

## 2022-11-22 DIAGNOSIS — G301 Alzheimer's disease with late onset: Secondary | ICD-10-CM | POA: Insufficient documentation

## 2022-11-22 LAB — TSH: TSH: 1.384 u[IU]/mL (ref 0.350–4.500)

## 2022-11-26 DIAGNOSIS — R634 Abnormal weight loss: Secondary | ICD-10-CM | POA: Insufficient documentation

## 2022-11-30 ENCOUNTER — Encounter: Payer: Self-pay | Admitting: Internal Medicine

## 2022-11-30 ENCOUNTER — Non-Acute Institutional Stay (SKILLED_NURSING_FACILITY): Payer: Medicare PPO | Admitting: Internal Medicine

## 2022-11-30 DIAGNOSIS — G40909 Epilepsy, unspecified, not intractable, without status epilepticus: Secondary | ICD-10-CM | POA: Diagnosis not present

## 2022-11-30 DIAGNOSIS — D696 Thrombocytopenia, unspecified: Secondary | ICD-10-CM

## 2022-11-30 DIAGNOSIS — I10 Essential (primary) hypertension: Secondary | ICD-10-CM | POA: Diagnosis not present

## 2022-11-30 DIAGNOSIS — F03918 Unspecified dementia, unspecified severity, with other behavioral disturbance: Secondary | ICD-10-CM

## 2022-11-30 DIAGNOSIS — E440 Moderate protein-calorie malnutrition: Secondary | ICD-10-CM

## 2022-11-30 NOTE — Progress Notes (Signed)
   NURSING HOME LOCATION:  [facility] ROOM NUMBER:  116 D  CODE STATUS:  Full Code  PCP:  Ok Edwards NP  This is a nursing facility follow up visit of chronic medical diagnoses & to document compliance with Regulation 483.30 (c) in [employer] [place] Manual Phase 2 which mandates caregiver visit ( visits can alternate among physician, PA or NP as per statutes) within 10 days of 30 days / 60 days/ 90 days post admission to SNF date    Interim medical record and care since last SNF visit was updated with review of diagnostic studies and change in clinical status since last visit were documented.  HPI:  Review of systems: Dementia invalidated responses. Date given as   Constitutional: No fever, significant weight change, fatigue  Eyes: No redness, discharge, pain, vision change ENT/mouth: No nasal congestion,  purulent discharge, earache, change in hearing, sore throat  Cardiovascular: No chest pain, palpitations, paroxysmal nocturnal dyspnea, claudication, edema  Respiratory: No cough, sputum production, hemoptysis, DOE, significant snoring, apnea   Gastrointestinal: No heartburn, dysphagia, abdominal pain, nausea /vomiting, rectal bleeding, melena, change in bowels Genitourinary: No dysuria, hematuria, pyuria, incontinence, nocturia Musculoskeletal: No joint stiffness, joint swelling, weakness, pain Dermatologic: No rash, pruritus, change in appearance of skin Neurologic: No dizziness, headache, syncope, seizures, numbness, tingling Psychiatric: No significant anxiety, depression, insomnia, anorexia Endocrine: No change in hair/skin/nails, excessive thirst, excessive hunger, excessive urination  Hematologic/lymphatic: No significant bruising, lymphadenopathy, abnormal bleeding Allergy/immunology: No itchy/watery eyes, significant sneezing, urticaria, angioedema  Physical exam:  Pertinent or positive findings: General appearance: Adequately nourished; no  acute distress, increased work of breathing is present.   Lymphatic: No lymphadenopathy about the head, neck, axilla. Eyes: No conjunctival inflammation or lid edema is present. There is no scleral icterus. Ears:  External ear exam shows no significant lesions or deformities.   Nose:  External nasal examination shows no deformity or inflammation. Nasal mucosa are pink and moist without lesions, exudates Oral exam:  Lips and gums are healthy appearing. There is no oropharyngeal erythema or exudate. Neck:  No thyromegaly, masses, tenderness noted.    Heart:  Normal rate and regular rhythm. S1 and S2 normal without gallop, murmur, click, rub .  Lungs: Chest clear to auscultation without wheezes, rhonchi, rales, rubs. Abdomen: Bowel sounds are normal. Abdomen is soft and nontender with no organomegaly, hernias, masses. GU: Deferred  Extremities:  No cyanosis, clubbing, edema  Neurologic exam : Cn 2-7 intact Strength equal  in upper & lower extremities Balance, Rhomberg, finger to nose testing could not be completed due to clinical state Deep tendon reflexes are equal Skin: Warm & dry w/o tenting. No significant lesions or rash.  See summary under each active problem in the Problem List with associated updated therapeutic plan

## 2022-12-01 NOTE — Assessment & Plan Note (Signed)
BP controlled; no change in antihypertensive medications  

## 2022-12-01 NOTE — Patient Instructions (Signed)
See assessment and plan under each diagnosis in the problem list and acutely for this visit 

## 2022-12-01 NOTE — Assessment & Plan Note (Signed)
Platelet count is normal.  No bleeding dyscrasias reported by staff.  Continue to monitor.

## 2022-12-01 NOTE — Assessment & Plan Note (Signed)
Current albumin 3.3 and total protein 6.0.  Nutritionist to monitor at [facility].

## 2022-12-01 NOTE — Assessment & Plan Note (Signed)
Can provide no meaningful history.  When asked to describe her headache she could not characterize them or quantitate them.  Her response to how often were she having headaches was "not too bad." No behavioral issues reported by staff.

## 2022-12-01 NOTE — Assessment & Plan Note (Signed)
No seizures reported by staff at the SNF.  No change in present Keppra dosage.

## 2022-12-11 ENCOUNTER — Encounter: Payer: Self-pay | Admitting: Adult Health

## 2022-12-11 ENCOUNTER — Non-Acute Institutional Stay (SKILLED_NURSING_FACILITY): Payer: Medicare PPO | Admitting: Adult Health

## 2022-12-11 ENCOUNTER — Other Ambulatory Visit (HOSPITAL_COMMUNITY)
Admission: RE | Admit: 2022-12-11 | Discharge: 2022-12-11 | Disposition: A | Discharge status: Home / Self Care | Payer: Medicare PPO | Source: Skilled Nursing Facility | Attending: Adult Health | Admitting: Adult Health

## 2022-12-11 DIAGNOSIS — E876 Hypokalemia: Secondary | ICD-10-CM

## 2022-12-11 DIAGNOSIS — Z79899 Other long term (current) drug therapy: Secondary | ICD-10-CM | POA: Diagnosis not present

## 2022-12-11 DIAGNOSIS — F418 Other specified anxiety disorders: Secondary | ICD-10-CM | POA: Diagnosis not present

## 2022-12-11 DIAGNOSIS — I10 Essential (primary) hypertension: Secondary | ICD-10-CM | POA: Insufficient documentation

## 2022-12-11 DIAGNOSIS — F01B11 Vascular dementia, moderate, with agitation: Secondary | ICD-10-CM

## 2022-12-11 DIAGNOSIS — F422 Mixed obsessional thoughts and acts: Secondary | ICD-10-CM

## 2022-12-11 DIAGNOSIS — F03911 Unspecified dementia, unspecified severity, with agitation: Secondary | ICD-10-CM | POA: Insufficient documentation

## 2022-12-11 LAB — COMPREHENSIVE METABOLIC PANEL
ALT: 8 U/L (ref 0–44)
AST: 20 U/L (ref 15–41)
Albumin: 4 g/dL (ref 3.5–5.0)
Alkaline Phosphatase: 58 U/L (ref 38–126)
Anion gap: 11 (ref 5–15)
BUN: 18 mg/dL (ref 8–23)
CO2: 31 mmol/L (ref 22–32)
Calcium: 9.1 mg/dL (ref 8.9–10.3)
Chloride: 99 mmol/L (ref 98–111)
Creatinine, Ser: 0.69 mg/dL (ref 0.44–1.00)
GFR, Estimated: >60 mL/min (ref >60)
Glucose, Bld: 75 mg/dL (ref 70–99)
Potassium: 3.1 mmol/L — ABNORMAL LOW (ref 3.5–5.1)
Sodium: 141 mmol/L (ref 135–145)
Total Bilirubin: 0.6 mg/dL (ref 0.3–1.2)
Total Protein: 7.4 g/dL (ref 6.5–8.1)

## 2022-12-11 LAB — CBC WITH DIFFERENTIAL/PLATELET
Abs Immature Granulocytes: 0.01 10*3/uL (ref 0.00–0.07)
Basophils Absolute: 0 10*3/uL (ref 0.0–0.1)
Basophils Relative: 1 %
Eosinophils Absolute: 0.1 10*3/uL (ref 0.0–0.5)
Eosinophils Relative: 3 %
HCT: 43.3 % (ref 36.0–46.0)
Hemoglobin: 13.8 g/dL (ref 12.0–15.0)
Immature Granulocytes: 0 %
Lymphocytes Relative: 36 %
Lymphs Abs: 1.6 10*3/uL (ref 0.7–4.0)
MCH: 29.8 pg (ref 26.0–34.0)
MCHC: 31.9 g/dL (ref 30.0–36.0)
MCV: 93.5 fL (ref 80.0–100.0)
Monocytes Absolute: 0.5 10*3/uL (ref 0.1–1.0)
Monocytes Relative: 12 %
Neutro Abs: 2.2 10*3/uL (ref 1.7–7.7)
Neutrophils Relative %: 48 %
Platelets: 189 10*3/uL (ref 150–400)
RBC: 4.63 MIL/uL (ref 3.87–5.11)
RDW: 14.1 % (ref 11.5–15.5)
WBC: 4.5 10*3/uL (ref 4.0–10.5)
nRBC: 0 % (ref 0.0–0.2)

## 2022-12-11 LAB — VITAMIN B12: Vitamin B-12: 412 pg/mL (ref 180–914)

## 2022-12-11 LAB — TSH: TSH: 2.415 u[IU]/mL (ref 0.350–4.500)

## 2022-12-11 NOTE — Progress Notes (Signed)
Location:  [place] Room Number: 116 Place of Service:  SNF (31)   CODE STATUS: full   No Known Allergies  Chief Complaint  Patient presents with   Acute Visit    Behavioral issues     HPI:  Staff is concerned about her behavioral changes. She is trying to "find her children"; why was the NT in "her house". She was started on remeron on 11-20-22 for weight loss. She is focusing on the daily chronicle takes from 9-12 to read this paper and will not eat until it is read. She has had a fall yesterday without injury. These behaviors have been happening for the past several weeks to month. Her current weight is 218  pounds. She does have a history of OCD; depression and anxiety.   Past Medical History:  Diagnosis Date   Diverticulosis OCT 2010 TCS   SIGMOID COLON   Esophageal web 2010   DILATION 16 MM   Gastritis, Helicobacter pylori [id number]   Rx. ABO for 10 days    GERD (gastroesophageal reflux disease)    Hip fracture, left ([place])    2016   Hyperlipidemia    Hypertension    Restless legs syndrome    Seizures ([place]) January 2010   Post head trauma in fall .sees [name]    Past Surgical History:  Procedure Laterality Date   ABDOMINAL HYSTERECTOMY     COLONOSCOPY  OCT 2010   TORTUOUS, Sml IH, Tics, MULTIPLE SIMPLE ADENOMAS (<6MM)   HIP ARTHROPLASTY Left 11/19/2014   Procedure: LEFT PARTIAL HIP REPLACEMENT;  Surgeon: [name], MD;  Location: [facility];  Service: Orthopedics;  Laterality: Left;   ORIF HIP FRACTURE  04/05/2012   Procedure: OPEN REDUCTION INTERNAL FIXATION HIP;  Surgeon: [name], MD;  Location: [facility];  Service: Orthopedics;  Laterality: Right;   UPPER GASTROINTESTINAL ENDOSCOPY  AUG 2010   SAVARY   VESICOVAGINAL FISTULA CLOSURE W/ TAH  1966   bleeding     Social History   Socioeconomic History   Marital status: Widowed    Spouse name: Not on file   Number of children: Not on file   Years of education: Not on  file   Highest education level: Not on file  Occupational History   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Market researcher    Occupation: retired   Tobacco Use   Smoking status: Never   Smokeless tobacco: Never  Scientific laboratory technician Use: Never used  Substance and Sexual Activity   Alcohol use: No   Drug use: No   Sexual activity: Not Currently  Other Topics Concern   Not on file  Social History Narrative   Long term resident of [facility]    Social Determinants of Health   Financial Resource Strain: [place]  (07/14/2018)   Overall Financial Resource Strain (CARDIA)    Difficulty of Paying Living Expenses: Not hard at all  Food Insecurity: No Food Insecurity (07/14/2018)   Hunger Vital Sign    Worried About Running Out of Food in the Last Year: Never true    [place] in the Last Year: Never true  Transportation Needs: No Transportation Needs (07/14/2018)   PRAPARE - Hydrologist (Medical): No    Lack of Transportation (Non-Medical): No  Physical Activity: Insufficiently Active (07/14/2018)   Exercise Vital Sign    Days of Exercise per Week: 4 days    Minutes of  Exercise per Session: 20 min  Stress: Stress Concern Present (07/14/2018)   [place]    Feeling of Stress : To some extent  Social Connections: Moderately Isolated (07/14/2018)   Social Connection and Isolation Panel [NHANES]    Frequency of Communication with Friends and Family: Twice a week    Frequency of Social Gatherings with Friends and Family: Once a week    Attends Religious Services: Never    Marine scientist or Organizations: No    Attends Archivist Meetings: Never    Marital Status: Widowed  Intimate Partner Violence: Not At Risk (07/14/2018)   Humiliation, Afraid, Rape, and Kick questionnaire    Fear of Current or Ex-Partner: No    Emotionally Abused: No    Physically Abused: No    Sexually  Abused: No   Family History  Problem Relation Age of Onset   Cancer Mother        bladder    Diabetes Mother    Diabetes Father    Emphysema Father    GER disease Brother    Colon cancer Neg Hx    Colon polyps Neg Hx       VITAL SIGNS BP 121/70   Pulse 70   Temp 98.2 F (36.8 C)   Resp 20   Ht '5\' 7"'$  (1.702 m)   Wt 218 lb (98.9 kg)   SpO2 95%   BMI 34.14 kg/m   Outpatient Encounter Medications as of 12/11/2022  Medication Sig   Acetaminophen (TYLENOL ARTHRITIS PAIN PO) Take 650 mg by mouth in the morning and at bedtime.   aspirin 81 MG chewable tablet Chew 81 mg by mouth daily.   bisacodyl (DULCOLAX) 10 MG suppository Place 10 mg rectally as needed.   calcium carbonate (TUMS - DOSED IN MG ELEMENTAL CALCIUM) 500 MG chewable tablet Chew 1 tablet by mouth 3 (three) times daily. DX age related osteoporosis without current pathological fracture 07/10/2018 per Dr.Gupta note   Carbidopa-Levodopa ER (SINEMET CR) 25-100 MG tablet controlled release Take 1 tablet by mouth 3 (three) times daily.   FLUoxetine (PROZAC) 40 MG capsule Take 40 mg by mouth daily.   fluticasone-salmeterol (ADVAIR) 100-50 MCG/ACT AEPB Inhale 1 puff into the lungs 2 (two) times daily. Special Instructions: RINSE MOUTH AFTER USE. DISCARD AFTER 1 MONTH   hydrochlorothiazide (HYDRODIURIL) 12.5 MG tablet Take 12.5 mg by mouth daily.   levETIRAcetam (KEPPRA) 250 MG tablet Take 250 mg by mouth 2 (two) times daily.   Melatonin 5 MG TABS Take 5 mg by mouth at bedtime.   memantine (NAMENDA) 10 MG tablet Take 10 mg by mouth 2 (two) times daily.   mirtazapine (REMERON) 7.5 MG tablet Take 7.5 mg by mouth at bedtime. for appetite   NON FORMULARY Diet type: Regular   potassium chloride (K-DUR,KLOR-CON) 10 MEQ tablet Take 10 mEq by mouth daily.   sennosides-docusate sodium (SENOKOT-S) 8.6-50 MG tablet Take 1 tablet by mouth in the morning and at bedtime. For Constipation   zinc oxide 20 % ointment Apply 1 Application topically  3 (three) times daily. Apply to sacrum, coccyx and bilateral buttocks   No facility-administered encounter medications on file as of 12/11/2022.     SIGNIFICANT DIAGNOSTIC EXAMS  PREVIOUS  09-07-19: DEXA: t score -1.06  01-07-22: chest x-ray: mild CHF    NO NEW EXAMS.   LABS REVIEWED PREVIOUS  01-07-22: wbc 9.0; hgb 13.0; hct 40.9; mcv 91.7 plt 143; glucose  116; bun 20; creat 0.73 ;k+ 3.5; na++ 138; ca 8.6; GFR>60; d-dimer 0.94; CRP 10.5  01-15-22: CRP 0.7 02-01-22: hepatitis C nr  10-04-22: wbc 4.6; hgb 12.0; hct 37.9; mcv 94.5 plt 157; glucose 75; bun 21; creat 0.70; k+ 3.6; na++ 143; ca 8.2 gfr >60; protein 6.0; albumin 3.3 chol 137; ldl 86; trig 55; hdl 40  TODAY  12-11-22: wbc 4.5; hgb 13.8; hct 43.3; mcv 93.5 plt 189; glucose 75 bun 18; creat 0.69; k+ 3.1; na++ 141; ca 9.1; protein 7.4 albumin 4.0; tsh 2.415; vitamin B 12: 412    Review of Systems  Reason unable to perform ROS: lethargic.   Physical Exam Constitutional:      General: She is not in acute distress.    Appearance: She is well-developed. She is obese. She is not diaphoretic.  Neck:     Thyroid: No thyromegaly.  Cardiovascular:     Rate and Rhythm: Normal rate and regular rhythm.     Pulses: Normal pulses.     Heart sounds: Normal heart sounds.  Pulmonary:     Effort: Pulmonary effort is normal. No respiratory distress.     Breath sounds: Normal breath sounds.  Abdominal:     General: Bowel sounds are normal. There is no distension.     Palpations: Abdomen is soft.     Tenderness: There is no abdominal tenderness.  Musculoskeletal:     Cervical back: Neck supple.     Right lower leg: No edema.     Left lower leg: No edema.     Comments:  Is able to move all extremities    Lymphadenopathy:     Cervical: No cervical adenopathy.  Skin:    General: Skin is warm and dry.  Neurological:     Mental Status: She is alert. Mental status is at baseline.  Psychiatric:        Mood and Affect: Mood normal.        ASSESSMENT/ PLAN:  TODAY  Mixed obsessional thoughts and acts Depression with anxiety 3. Hypokalemia  Will give k+ 40 meq daily Will stop hctz Will check BMP 12-17-22 Will begin rexulti for agitation with dementia    Ok Edwards NP [facility]   call [phone]

## 2022-12-12 ENCOUNTER — Non-Acute Institutional Stay (SKILLED_NURSING_FACILITY): Payer: Medicare PPO | Admitting: Adult Health

## 2022-12-12 ENCOUNTER — Encounter: Payer: Self-pay | Admitting: Adult Health

## 2022-12-12 DIAGNOSIS — G40909 Epilepsy, unspecified, not intractable, without status epilepticus: Secondary | ICD-10-CM

## 2022-12-12 LAB — LEVETIRACETAM LEVEL: Levetiracetam Lvl: 8.1 ug/mL — ABNORMAL LOW (ref 10.0–40.0)

## 2022-12-12 NOTE — Progress Notes (Signed)
Location:  [place] Room Number: 62- D Place of Service:  SNF (31)   CODE STATUS: Full Code   No Known Allergies  Chief Complaint  Patient presents with   Acute Visit    Follow up on labs     HPI:  She has been taking keppra 250 mg twice daily long term for seizure prevention. There have been no reports of seizure activity. Her keppra level returned at 8.1.   Past Medical History:  Diagnosis Date   Diverticulosis OCT 2010 TCS   SIGMOID COLON   Esophageal web 2010   DILATION 16 MM   Gastritis, Helicobacter pylori [id number]   Rx. ABO for 10 days    GERD (gastroesophageal reflux disease)    Hip fracture, left ([place])    2016   Hyperlipidemia    Hypertension    Restless legs syndrome    Seizures ([place]) January 2010   Post head trauma in fall .sees [name]    Past Surgical History:  Procedure Laterality Date   ABDOMINAL HYSTERECTOMY     COLONOSCOPY  OCT 2010   TORTUOUS, Sml IH, Tics, MULTIPLE SIMPLE ADENOMAS (<6MM)   HIP ARTHROPLASTY Left 11/19/2014   Procedure: LEFT PARTIAL HIP REPLACEMENT;  Surgeon: [name], MD;  Location: [facility];  Service: Orthopedics;  Laterality: Left;   ORIF HIP FRACTURE  04/05/2012   Procedure: OPEN REDUCTION INTERNAL FIXATION HIP;  Surgeon: [name], MD;  Location: [facility];  Service: Orthopedics;  Laterality: Right;   UPPER GASTROINTESTINAL ENDOSCOPY  AUG 2010   SAVARY   VESICOVAGINAL FISTULA CLOSURE W/ TAH  1966   bleeding     Social History   Socioeconomic History   Marital status: Widowed    Spouse name: Not on file   Number of children: Not on file   Years of education: Not on file   Highest education level: Not on file  Occupational History   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Market researcher    Occupation: retired   Tobacco Use   Smoking status: Never   Smokeless tobacco: Never  Scientific laboratory technician Use: Never used  Substance and Sexual Activity   Alcohol use: No   Drug use:  No   Sexual activity: Not Currently  Other Topics Concern   Not on file  Social History Narrative   Long term resident of [facility]    Social Determinants of Health   Financial Resource Strain: [place]  (07/14/2018)   Overall Financial Resource Strain (CARDIA)    Difficulty of Paying Living Expenses: Not hard at all  Food Insecurity: No Food Insecurity (07/14/2018)   Hunger Vital Sign    Worried About Running Out of Food in the Last Year: Never true    [place] in the Last Year: Never true  Transportation Needs: No Transportation Needs (07/14/2018)   PRAPARE - Hydrologist (Medical): No    Lack of Transportation (Non-Medical): No  Physical Activity: Insufficiently Active (07/14/2018)   Exercise Vital Sign    Days of Exercise per Week: 4 days    Minutes of Exercise per Session: 20 min  Stress: Stress Concern Present (07/14/2018)   [place]    Feeling of Stress : To some extent  Social Connections: Moderately Isolated (07/14/2018)   Social Connection and Isolation Panel [NHANES]    Frequency of Communication with Friends and Family: Twice a week  Frequency of Social Gatherings with Friends and Family: Once a week    Attends Religious Services: Never    Marine scientist or Organizations: No    Attends Archivist Meetings: Never    Marital Status: Widowed  Intimate Partner Violence: Not At Risk (07/14/2018)   Humiliation, Afraid, Rape, and Kick questionnaire    Fear of Current or Ex-Partner: No    Emotionally Abused: No    Physically Abused: No    Sexually Abused: No   Family History  Problem Relation Age of Onset   Cancer Mother        bladder    Diabetes Mother    Diabetes Father    Emphysema Father    GER disease Brother    Colon cancer Neg Hx    Colon polyps Neg Hx       VITAL SIGNS BP 121/73   Pulse 73   Ht '5\' 7"'$  (1.702 m)   Wt 218 lb (98.9 kg)    BMI 34.14 kg/m   Outpatient Encounter Medications as of 12/12/2022  Medication Sig   Acetaminophen (TYLENOL ARTHRITIS PAIN PO) Take 650 mg by mouth in the morning and at bedtime.   aspirin 81 MG chewable tablet Chew 81 mg by mouth daily.   bisacodyl (DULCOLAX) 10 MG suppository Place 10 mg rectally as needed.   brexpiprazole (REXULTI) 0.25 MG TABS tablet Take 0.25 mg by mouth daily.   calcium carbonate (TUMS - DOSED IN MG ELEMENTAL CALCIUM) 500 MG chewable tablet Chew 1 tablet by mouth 3 (three) times daily. DX age related osteoporosis without current pathological fracture 07/10/2018 per Dr.Gupta note   Carbidopa-Levodopa ER (SINEMET CR) 25-100 MG tablet controlled release Take 1 tablet by mouth 3 (three) times daily.   FLUoxetine (PROZAC) 40 MG capsule Take 40 mg by mouth daily.   fluticasone-salmeterol (ADVAIR) 100-50 MCG/ACT AEPB Inhale 1 puff into the lungs 2 (two) times daily. Special Instructions: RINSE MOUTH AFTER USE. DISCARD AFTER 1 MONTH   levETIRAcetam (KEPPRA) 500 MG tablet Take 500 mg by mouth at bedtime.   Melatonin 5 MG TABS Take 5 mg by mouth at bedtime.   memantine (NAMENDA) 10 MG tablet Take 10 mg by mouth 2 (two) times daily.   NON FORMULARY Diet type: Regular   sennosides-docusate sodium (SENOKOT-S) 8.6-50 MG tablet Take 1 tablet by mouth in the morning and at bedtime. For Constipation   zinc oxide 20 % ointment Apply 1 Application topically 3 (three) times daily. Apply to sacrum, coccyx and bilateral buttocks   [DISCONTINUED] levETIRAcetam (KEPPRA) 250 MG tablet Take 250 mg by mouth 2 (two) times daily.   [DISCONTINUED] mirtazapine (REMERON) 7.5 MG tablet Take 7.5 mg by mouth at bedtime. for appetite   [DISCONTINUED] potassium chloride (KLOR-CON) 10 MEQ tablet Take 40 mEq by mouth 3 (three) times daily.   No facility-administered encounter medications on file as of 12/12/2022.     SIGNIFICANT DIAGNOSTIC EXAMS   PREVIOUS  09-07-19: DEXA: t score -1.06  01-07-22: chest  x-ray: mild CHF    NO NEW EXAMS.   LABS REVIEWED PREVIOUS  01-07-22: wbc 9.0; hgb 13.0; hct 40.9; mcv 91.7 plt 143; glucose 116; bun 20; creat 0.73 ;k+ 3.5; na++ 138; ca 8.6; GFR>60; d-dimer 0.94; CRP 10.5  01-15-22: CRP 0.7 02-01-22: hepatitis C nr  10-04-22: wbc 4.6; hgb 12.0; hct 37.9; mcv 94.5 plt 157; glucose 75; bun 21; creat 0.70; k+ 3.6; na++ 143; ca 8.2 gfr >60; protein 6.0; albumin 3.3 chol  137; ldl 86; trig 55; hdl 40  TODAY  12-11-22: wbc 4.5; hgb 13.8; hct 43.3; mcv 93.5 plt 189; glucose 75 bun 18; creat 0.69; k+ 3.1; na++ 141; ca 9.1; protein 7.4 albumin 4.0; tsh 2.415; vitamin B 12: 412  keppra 8.1   Review of Systems  Constitutional:  Negative for malaise/fatigue.  Respiratory:  Negative for cough and shortness of breath.   Cardiovascular:  Negative for chest pain, palpitations and leg swelling.  Gastrointestinal:  Negative for abdominal pain, constipation and heartburn.  Musculoskeletal:  Negative for back pain, joint pain and myalgias.  Skin: Negative.   Neurological:  Negative for dizziness.  Psychiatric/Behavioral:  Positive for depression. The patient is nervous/anxious.    Physical Exam Constitutional:      General: She is not in acute distress.    Appearance: She is well-developed. She is obese. She is not diaphoretic.  Neck:     Thyroid: No thyromegaly.  Cardiovascular:     Rate and Rhythm: Normal rate and regular rhythm.     Pulses: Normal pulses.     Heart sounds: Normal heart sounds.  Pulmonary:     Effort: Pulmonary effort is normal. No respiratory distress.     Breath sounds: Normal breath sounds.  Abdominal:     General: Bowel sounds are normal. There is no distension.     Palpations: Abdomen is soft.     Tenderness: There is no abdominal tenderness.  Musculoskeletal:     Cervical back: Neck supple.     Right lower leg: No edema.     Left lower leg: No edema.     Comments: Able to move all extremities   Lymphadenopathy:     Cervical: No cervical  adenopathy.  Skin:    General: Skin is warm and dry.  Neurological:     Mental Status: She is alert. Mental status is at baseline.  Psychiatric:        Mood and Affect: Mood normal.       ASSESSMENT/ PLAN:  TODAY  Seizure disorder: no recent seizure activity. Will change keppra to 250 mg in the AM and 150 mg nightly and will repeat level on 12-20-22.    Ok Edwards NP [facility] [place] [facility]  call [phone]

## 2022-12-17 ENCOUNTER — Other Ambulatory Visit (HOSPITAL_COMMUNITY)
Admission: RE | Admit: 2022-12-17 | Discharge: 2022-12-17 | Disposition: A | Discharge status: Home / Self Care | Payer: Medicare PPO | Source: Skilled Nursing Facility | Attending: Adult Health | Admitting: Adult Health

## 2022-12-17 DIAGNOSIS — I10 Essential (primary) hypertension: Secondary | ICD-10-CM | POA: Diagnosis present

## 2022-12-17 LAB — BASIC METABOLIC PANEL
Anion gap: 6 (ref 5–15)
BUN: 19 mg/dL (ref 8–23)
CO2: 30 mmol/L (ref 22–32)
Calcium: 8 mg/dL — ABNORMAL LOW (ref 8.9–10.3)
Chloride: 103 mmol/L (ref 98–111)
Creatinine, Ser: 0.69 mg/dL (ref 0.44–1.00)
GFR, Estimated: >60 mL/min (ref >60)
Glucose, Bld: 77 mg/dL (ref 70–99)
Potassium: 3.2 mmol/L — ABNORMAL LOW (ref 3.5–5.1)
Sodium: 139 mmol/L (ref 135–145)

## 2022-12-24 ENCOUNTER — Non-Acute Institutional Stay (SKILLED_NURSING_FACILITY): Payer: Medicare PPO | Admitting: Adult Health

## 2022-12-24 ENCOUNTER — Encounter: Payer: Self-pay | Admitting: Adult Health

## 2022-12-24 ENCOUNTER — Other Ambulatory Visit (HOSPITAL_COMMUNITY)
Admission: RE | Admit: 2022-12-24 | Discharge: 2022-12-24 | Disposition: A | Discharge status: Home / Self Care | Payer: Medicare PPO | Source: Skilled Nursing Facility | Attending: Adult Health | Admitting: Adult Health

## 2022-12-24 DIAGNOSIS — I10 Essential (primary) hypertension: Secondary | ICD-10-CM | POA: Diagnosis present

## 2022-12-24 DIAGNOSIS — F01B11 Vascular dementia, moderate, with agitation: Secondary | ICD-10-CM | POA: Diagnosis not present

## 2022-12-24 DIAGNOSIS — J410 Simple chronic bronchitis: Secondary | ICD-10-CM

## 2022-12-24 LAB — BASIC METABOLIC PANEL
Anion gap: 6 (ref 5–15)
BUN: 22 mg/dL (ref 8–23)
CO2: 29 mmol/L (ref 22–32)
Calcium: 8.1 mg/dL — ABNORMAL LOW (ref 8.9–10.3)
Chloride: 105 mmol/L (ref 98–111)
Creatinine, Ser: 0.72 mg/dL (ref 0.44–1.00)
GFR, Estimated: >60 mL/min (ref >60)
Glucose, Bld: 77 mg/dL (ref 70–99)
Potassium: 3.7 mmol/L (ref 3.5–5.1)
Sodium: 140 mmol/L (ref 135–145)

## 2022-12-24 NOTE — Progress Notes (Unsigned)
Location:  [place] Room Number: 116-D Place of Service:  SNF (31)   CODE STATUS: Full Code  No Known Allergies  Chief Complaint  Patient presents with   Acute Visit    changes in behaviors    HPI:  She has had 2 falls over the past weekend without injury. She was resistant to getting up from the fall. She has been having hallucinations and delusions about children. She has been having episodes of yelling out. There are no indications of pain present. She does go to the dining room for lunch she is presently taking rexulti 0.25 mg daily.   Past Medical History:  Diagnosis Date   Diverticulosis OCT 2010 TCS   SIGMOID COLON   Esophageal web 2010   DILATION 16 MM   Gastritis, Helicobacter pylori [id number]   Rx. ABO for 10 days    GERD (gastroesophageal reflux disease)    Hip fracture, left ([place])    2016   Hyperlipidemia    Hypertension    Restless legs syndrome    Seizures ([place]) January 2010   Post head trauma in fall .sees [name]    Past Surgical History:  Procedure Laterality Date   ABDOMINAL HYSTERECTOMY     COLONOSCOPY  OCT 2010   TORTUOUS, Sml IH, Tics, MULTIPLE SIMPLE ADENOMAS (<6MM)   HIP ARTHROPLASTY Left 11/19/2014   Procedure: LEFT PARTIAL HIP REPLACEMENT;  Surgeon: [name], MD;  Location: [facility];  Service: Orthopedics;  Laterality: Left;   ORIF HIP FRACTURE  04/05/2012   Procedure: OPEN REDUCTION INTERNAL FIXATION HIP;  Surgeon: [name], MD;  Location: [facility];  Service: Orthopedics;  Laterality: Right;   UPPER GASTROINTESTINAL ENDOSCOPY  AUG 2010   SAVARY   VESICOVAGINAL FISTULA CLOSURE W/ TAH  1966   bleeding     Social History   Socioeconomic History   Marital status: Widowed    Spouse name: Not on file   Number of children: Not on file   Years of education: Not on file   Highest education level: Not on file  Occupational History   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   Tobacco Use   Smoking status: Never   Smokeless tobacco: Never  Scientific laboratory technician Use: Never used  Substance and Sexual Activity   Alcohol use: No   Drug use: No   Sexual activity: Not Currently  Other Topics Concern   Not on file  Social History Narrative   [facility] term resident of [facility]    Social Determinants of Health   Financial Resource Strain: [place]  (07/14/2018)   Overall Financial Resource Strain (CARDIA)    Difficulty of Paying Living Expenses: Not hard at all  Food Insecurity: No Food Insecurity (07/14/2018)   Hunger Vital Sign    Worried About Running Out of Food in the Last Year: Never true    [place] in the Last Year: Never true  Transportation Needs: No Transportation Needs (07/14/2018)   PRAPARE - Hydrologist (Medical): No    Lack of Transportation (Non-Medical): No  Physical Activity: Insufficiently Active (07/14/2018)   Exercise Vital Sign    Days of Exercise per Week: 4 days    Minutes of Exercise per Session: 20 min  Stress: Stress Concern Present (07/14/2018)   [place]    Feeling of Stress : To  some extent  Social Connections: Moderately Isolated (07/14/2018)   Social Connection and Isolation Panel [NHANES]    Frequency of Communication with Friends and Family: Twice a week    Frequency of Social Gatherings with Friends and Family: Once a week    Attends Religious Services: Never    Marine scientist or Organizations: No    Attends Archivist Meetings: Never    Marital Status: Widowed  Intimate Partner Violence: Not At Risk (07/14/2018)   Humiliation, Afraid, Rape, and Kick questionnaire    Fear of Current or Ex-Partner: No    Emotionally Abused: No    Physically Abused: No    Sexually Abused: No   Family History  Problem Relation Age of Onset   Cancer Mother        bladder    Diabetes Mother    Diabetes  Father    Emphysema Father    GER disease Brother    Colon cancer Neg Hx    Colon polyps Neg Hx       VITAL SIGNS BP 122/86   Pulse 68   Temp 98 F (36.7 C)   Resp 18   Ht 5\' 7"  (1.702 m)   Wt 220 lb (99.8 kg)   SpO2 100%   BMI 34.46 kg/m   Outpatient Encounter Medications as of 12/24/2022  Medication Sig   Acetaminophen (TYLENOL ARTHRITIS PAIN PO) Take 650 mg by mouth in the morning and at bedtime.   aspirin 81 MG chewable tablet Chew 81 mg by mouth daily.   bisacodyl (DULCOLAX) 10 MG suppository Place 10 mg rectally as needed.   brexpiprazole (REXULTI) 0.25 MG TABS tablet Take 0.25 mg by mouth daily.   calcium carbonate (TUMS - DOSED IN MG ELEMENTAL CALCIUM) 500 MG chewable tablet Chew 1 tablet by mouth 3 (three) times daily. DX age related osteoporosis without current pathological fracture 07/10/2018 per Dr.Gupta note   Carbidopa-Levodopa ER (SINEMET CR) 25-100 MG tablet controlled release Take 1 tablet by mouth 3 (three) times daily.   FLUoxetine (PROZAC) 40 MG capsule Take 40 mg by mouth daily.   fluticasone-salmeterol (ADVAIR) 100-50 MCG/ACT AEPB Inhale 1 puff into the lungs 2 (two) times daily. Special Instructions: RINSE MOUTH AFTER USE. DISCARD AFTER 1 MONTH   levETIRAcetam (KEPPRA) 250 MG tablet Take 250 mg by mouth 2 (two) times daily. Take 250 mg in the AM and 500 mg in the PM   Melatonin 5 MG TABS Take 5 mg by mouth at bedtime.   memantine (NAMENDA) 10 MG tablet Take 10 mg by mouth 2 (two) times daily.   NON FORMULARY Diet type: Regular   sennosides-docusate sodium (SENOKOT-S) 8.6-50 MG tablet Take 1 tablet by mouth in the morning and at bedtime. For Constipation   zinc oxide 20 % ointment Apply 1 Application topically 3 (three) times daily. Apply to sacrum, coccyx and bilateral buttocks   No facility-administered encounter medications on file as of 12/24/2022.     SIGNIFICANT DIAGNOSTIC EXAMS  PREVIOUS  09-07-19: DEXA: t score -1.06  01-07-22: chest x-ray:  mild CHF    NO NEW EXAMS.   LABS REVIEWED PREVIOUS  01-07-22: wbc 9.0; hgb 13.0; hct 40.9; mcv 91.7 plt 143; glucose 116; bun 20; creat 0.73 ;k+ 3.5; na++ 138; ca 8.6; GFR>60; d-dimer 0.94; CRP 10.5  01-15-22: CRP 0.7 02-01-22: hepatitis C nr  10-04-22: wbc 4.6; hgb 12.0; hct 37.9; mcv 94.5 plt 157; glucose 75; bun 21; creat 0.70; k+ 3.6; na++ 143; ca  8.2 gfr >60; protein 6.0; albumin 3.3 chol 137; ldl 86; trig 55; hdl 40  TODAY  12-11-22: wbc 4.5; hgb 13.8; hct 43.3; mcv 93.5 plt 189; glucose 75 bun 18; creat 0.69; k+ 3.1; na++ 141; ca 9.1; protein 7.4 albumin 4.0; tsh 2.415; vitamin B 12: 412  keppra 8.1  12-17-22: glucose 77; bun 19; creat 0.69; k+ 3.2; na++ 139; ca 8.0; gfr >60 12-24-22: glucose 77; bun 22; creat 0.75; k+ 3.7; na++ 140; ca 8.1; gfr >60   Review of Systems  Constitutional:  Negative for malaise/fatigue.  Respiratory:  Negative for cough and shortness of breath.   Cardiovascular:  Negative for chest pain, palpitations and leg swelling.  Gastrointestinal:  Negative for abdominal pain, constipation and heartburn.  Musculoskeletal:  Negative for back pain, joint pain and myalgias.  Skin: Negative.   Neurological:  Negative for dizziness.  Psychiatric/Behavioral:  Positive for depression. Negative for suicidal ideas. The patient is nervous/anxious.    Physical Exam Constitutional:      General: She is not in acute distress.    Appearance: She is well-developed. She is obese. She is not diaphoretic.  Neck:     Thyroid: No thyromegaly.  Cardiovascular:     Rate and Rhythm: Normal rate and regular rhythm.     Pulses: Normal pulses.     Heart sounds: Normal heart sounds.  Pulmonary:     Effort: Pulmonary effort is normal. No respiratory distress.     Breath sounds: Normal breath sounds.  Abdominal:     General: Bowel sounds are normal. There is no distension.     Palpations: Abdomen is soft.     Tenderness: There is no abdominal tenderness.  Musculoskeletal:      Cervical back: Neck supple.     Right lower leg: No edema.     Left lower leg: No edema.     Comments: Able to move all extremities   Lymphadenopathy:     Cervical: No cervical adenopathy.  Skin:    General: Skin is warm and dry.  Neurological:     Mental Status: She is alert. Mental status is at baseline.  Psychiatric:        Mood and Affect: Mood normal.        ASSESSMENT/ PLAN:  TODAY  Simple chronic bronchitis Moderate vascular dementia with agitation  Is on room air Will increase rexulti to 0.5 mg daily to better manage her agitation and hallucinations  Will check cbc; and keppra levels    Ok Edwards NP [facility]   call [phone]

## 2022-12-27 ENCOUNTER — Other Ambulatory Visit (HOSPITAL_COMMUNITY)
Admission: RE | Admit: 2022-12-27 | Discharge: 2022-12-27 | Disposition: A | Discharge status: Home / Self Care | Payer: Medicare PPO | Source: Skilled Nursing Facility | Attending: Adult Health | Admitting: Adult Health

## 2022-12-27 DIAGNOSIS — Z5181 Encounter for therapeutic drug level monitoring: Secondary | ICD-10-CM | POA: Insufficient documentation

## 2022-12-27 DIAGNOSIS — G4089 Other seizures: Secondary | ICD-10-CM | POA: Insufficient documentation

## 2022-12-27 LAB — CBC
HCT: 36.9 % (ref 36.0–46.0)
Hemoglobin: 11.9 g/dL — ABNORMAL LOW (ref 12.0–15.0)
MCH: 29.9 pg (ref 26.0–34.0)
MCHC: 32.2 g/dL (ref 30.0–36.0)
MCV: 92.7 fL (ref 80.0–100.0)
Platelets: 148 10*3/uL — ABNORMAL LOW (ref 150–400)
RBC: 3.98 MIL/uL (ref 3.87–5.11)
RDW: 14.4 % (ref 11.5–15.5)
WBC: 4.1 10*3/uL (ref 4.0–10.5)
nRBC: 0 % (ref 0.0–0.2)

## 2022-12-28 ENCOUNTER — Non-Acute Institutional Stay (SKILLED_NURSING_FACILITY): Payer: Medicare PPO | Admitting: Adult Health

## 2022-12-28 ENCOUNTER — Encounter: Payer: Self-pay | Admitting: Adult Health

## 2022-12-28 DIAGNOSIS — F01B11 Vascular dementia, moderate, with agitation: Secondary | ICD-10-CM

## 2022-12-28 DIAGNOSIS — D696 Thrombocytopenia, unspecified: Secondary | ICD-10-CM | POA: Diagnosis not present

## 2022-12-28 DIAGNOSIS — G40909 Epilepsy, unspecified, not intractable, without status epilepticus: Secondary | ICD-10-CM | POA: Diagnosis not present

## 2022-12-28 NOTE — Progress Notes (Unsigned)
Location:  [place] Room Number: 116D Place of Service:  SNF (31)   CODE STATUS: DNR  No Known Allergies  Chief Complaint  Patient presents with   Acute Visit    Patient is being seen for a Care Plan meeting    HPI:  We have come together for her care plan meeting. Family present. BIMS 7/15 mood 3/30: nervous at times; hallucinations; delusions. SLUMS 1/30. Is nonambulatory uses wheelchair with 8 falls without injury. She requires max assist with adl care. She is incontinent of bladder and bowel. Dietary: weight is 220 pounds down 12 pounds over the past 6 months. Regular diet appetite 50-100%. Therapy: ST for cognition secondary to falls for safety and memory. Activities: does participate. We have discussed her advanced directives to include code status; hospitalization; tube feeding; ivf and abt. The MOST form has been filled out. She will continue to be followed for her chronic illnesses including:   Seizure disorder   Thrombocytopenia  Moderate vascular dementia with agitation  Past Medical History:  Diagnosis Date   Diverticulosis OCT 2010 TCS   SIGMOID COLON   Esophageal web 2010   DILATION 16 MM   Gastritis, Helicobacter pylori [id number]   Rx. ABO for 10 days    GERD (gastroesophageal reflux disease)    Hip fracture, left ([place])    2016   Hyperlipidemia    Hypertension    Restless legs syndrome    Seizures ([place]) January 2010   Post head trauma in fall .sees [name]    Past Surgical History:  Procedure Laterality Date   ABDOMINAL HYSTERECTOMY     COLONOSCOPY  OCT 2010   TORTUOUS, Sml IH, Tics, MULTIPLE SIMPLE ADENOMAS (<6MM)   HIP ARTHROPLASTY Left 11/19/2014   Procedure: LEFT PARTIAL HIP REPLACEMENT;  Surgeon: [name], MD;  Location: [facility];  Service: Orthopedics;  Laterality: Left;   ORIF HIP FRACTURE  04/05/2012   Procedure: OPEN REDUCTION INTERNAL FIXATION HIP;  Surgeon: [name], MD;  Location: [facility];  Service:  Orthopedics;  Laterality: Right;   UPPER GASTROINTESTINAL ENDOSCOPY  AUG 2010   SAVARY   VESICOVAGINAL FISTULA CLOSURE W/ TAH  1966   bleeding     Social History   Socioeconomic History   Marital status: Widowed    Spouse name: Not on file   Number of children: Not on file   Years of education: Not on file   Highest education level: Not on file  Occupational History   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Market researcher    Occupation: retired   Tobacco Use   Smoking status: Never   Smokeless tobacco: Never  Scientific laboratory technician Use: Never used  Substance and Sexual Activity   Alcohol use: No   Drug use: No   Sexual activity: Not Currently  Other Topics Concern   Not on file  Social History Narrative   Long term resident of [facility]    Social Determinants of Health   Financial Resource Strain: [place]  (07/14/2018)   Overall Financial Resource Strain (CARDIA)    Difficulty of Paying Living Expenses: Not hard at all  Food Insecurity: No Food Insecurity (07/14/2018)   Hunger Vital Sign    Worried About Running Out of Food in the Last Year: Never true    [place] in the Last Year: Never true  Transportation Needs: No Transportation Needs (07/14/2018)   PRAPARE - Hydrologist (  Medical): No    Lack of Transportation (Non-Medical): No  Physical Activity: Insufficiently Active (07/14/2018)   Exercise Vital Sign    Days of Exercise per Week: 4 days    Minutes of Exercise per Session: 20 min  Stress: Stress Concern Present (07/14/2018)   [place]    Feeling of Stress : To some extent  Social Connections: Moderately Isolated (07/14/2018)   Social Connection and Isolation Panel [NHANES]    Frequency of Communication with Friends and Family: Twice a week    Frequency of Social Gatherings with Friends and Family: Once a week    Attends Religious Services: Never    Museum/gallery conservator or Organizations: No    Attends Archivist Meetings: Never    Marital Status: Widowed  Intimate Partner Violence: Not At Risk (07/14/2018)   Humiliation, Afraid, Rape, and Kick questionnaire    Fear of Current or Ex-Partner: No    Emotionally Abused: No    Physically Abused: No    Sexually Abused: No   Family History  Problem Relation Age of Onset   Cancer Mother        bladder    Diabetes Mother    Diabetes Father    Emphysema Father    GER disease Brother    Colon cancer Neg Hx    Colon polyps Neg Hx       VITAL SIGNS BP 122/86   Pulse 68   Temp 98 F (36.7 C) (Temporal)   Resp 18   Ht 5\' 7"  (1.702 m)   Wt 220 lb (99.8 kg)   SpO2 100%   BMI 34.46 kg/m   Outpatient Encounter Medications as of 12/28/2022  Medication Sig   Acetaminophen (TYLENOL ARTHRITIS PAIN PO) Take 650 mg by mouth in the morning and at bedtime.   aspirin 81 MG chewable tablet Chew 81 mg by mouth daily.   bisacodyl (DULCOLAX) 10 MG suppository Place 10 mg rectally as needed.   Brexpiprazole (REXULTI) 0.5 MG TABS Take 0.5 mg by mouth daily at 2 PM.   calcium carbonate (TUMS - DOSED IN MG ELEMENTAL CALCIUM) 500 MG chewable tablet Chew 1 tablet by mouth 3 (three) times daily. DX age related osteoporosis without current pathological fracture 07/10/2018 per Dr.Gupta note   Carbidopa-Levodopa ER (SINEMET CR) 25-100 MG tablet controlled release Take 1 tablet by mouth 3 (three) times daily.   FLUoxetine (PROZAC) 40 MG capsule Take 40 mg by mouth daily.   fluticasone-salmeterol (ADVAIR) 100-50 MCG/ACT AEPB Inhale 1 puff into the lungs 2 (two) times daily. Special Instructions: RINSE MOUTH AFTER USE. DISCARD AFTER 1 MONTH   levETIRAcetam (KEPPRA) 250 MG tablet Take 250 mg by mouth 2 (two) times daily. Take 250 mg in the AM and 500 mg in the PM   Melatonin 5 MG TABS Take 5 mg by mouth at bedtime.   memantine (NAMENDA) 10 MG tablet Take 10 mg by mouth 2 (two) times daily.   NON FORMULARY Diet  type: Regular   sennosides-docusate sodium (SENOKOT-S) 8.6-50 MG tablet Take 1 tablet by mouth in the morning and at bedtime. For Constipation   zinc oxide 20 % ointment Apply 1 Application topically 3 (three) times daily. Apply to sacrum, coccyx and bilateral buttocks   No facility-administered encounter medications on file as of 12/28/2022.     SIGNIFICANT DIAGNOSTIC EXAMS  PREVIOUS  09-07-19: DEXA: t score -1.06  01-07-22: chest x-ray: mild CHF  NO NEW EXAMS.   LABS REVIEWED PREVIOUS  01-07-22: wbc 9.0; hgb 13.0; hct 40.9; mcv 91.7 plt 143; glucose 116; bun 20; creat 0.73 ;k+ 3.5; na++ 138; ca 8.6; GFR>60; d-dimer 0.94; CRP 10.5  01-15-22: CRP 0.7 02-01-22: hepatitis C nr  10-04-22: wbc 4.6; hgb 12.0; hct 37.9; mcv 94.5 plt 157; glucose 75; bun 21; creat 0.70; k+ 3.6; na++ 143; ca 8.2 gfr >60; protein 6.0; albumin 3.3 chol 137; ldl 86; trig 55; hdl 40 12-11-22: wbc 4.5; hgb 13.8; hct 43.3; mcv 93.5 plt 189; glucose 75 bun 18; creat 0.69; k+ 3.1; na++ 141; ca 9.1; protein 7.4 albumin 4.0; tsh 2.415; vitamin B 12: 412  keppra 8.1  12-17-22: glucose 77; bun 19; creat 0.69; k+ 3.2; na++ 139; ca 8.0; gfr >60 12-24-22: glucose 77; bun 22; creat 0.75; k+ 3.7; na++ 140; ca 8.1; gfr >60  NO NEW LABS.   Review of Systems  Constitutional:  Negative for malaise/fatigue.  Respiratory:  Negative for cough and shortness of breath.   Cardiovascular:  Negative for chest pain, palpitations and leg swelling.  Gastrointestinal:  Negative for abdominal pain, constipation and heartburn.  Musculoskeletal:  Negative for back pain, joint pain and myalgias.  Skin: Negative.   Neurological:  Negative for dizziness.  Psychiatric/Behavioral:  The patient is not nervous/anxious.    Physical Exam Constitutional:      General: She is not in acute distress.    Appearance: She is well-developed. She is obese. She is not diaphoretic.  Neck:     Thyroid: No thyromegaly.  Cardiovascular:     Rate and Rhythm:  Normal rate and regular rhythm.     Pulses: Normal pulses.     Heart sounds: Normal heart sounds.  Pulmonary:     Effort: Pulmonary effort is normal. No respiratory distress.     Breath sounds: Normal breath sounds.  Abdominal:     General: Bowel sounds are normal. There is no distension.     Palpations: Abdomen is soft.     Tenderness: There is no abdominal tenderness.  Musculoskeletal:     Cervical back: Neck supple.     Right lower leg: No edema.     Left lower leg: No edema.     Comments: Moves all extremities   Lymphadenopathy:     Cervical: No cervical adenopathy.  Skin:    General: Skin is warm and dry.  Neurological:     Mental Status: She is alert. Mental status is at baseline.  Psychiatric:        Mood and Affect: Mood normal.        ASSESSMENT/ PLAN:  TODAY  Seizure disorder Thrombocytopenia Moderate vascular dementia with agitation  Will continue current medications Will continue current plan of care Will continue to monitor her status.   Time spent with patient 45 minutes: MOST form filled out spent 25 minutes with advanced directives; medications; therapy.    Ok Edwards NP [facility]  call [phone]

## 2022-12-29 LAB — LEVETIRACETAM LEVEL: Levetiracetam Lvl: 12.9 ug/mL (ref 10.0–40.0)

## 2023-01-02 ENCOUNTER — Non-Acute Institutional Stay (SKILLED_NURSING_FACILITY): Payer: Medicare PPO | Admitting: Adult Health

## 2023-01-02 ENCOUNTER — Encounter: Payer: Self-pay | Admitting: Adult Health

## 2023-01-02 DIAGNOSIS — E876 Hypokalemia: Secondary | ICD-10-CM | POA: Diagnosis not present

## 2023-01-02 DIAGNOSIS — K5909 Other constipation: Secondary | ICD-10-CM | POA: Diagnosis not present

## 2023-01-02 DIAGNOSIS — J410 Simple chronic bronchitis: Secondary | ICD-10-CM

## 2023-01-02 NOTE — Progress Notes (Unsigned)
Location:  [facility] Room Number: 116 Place of Service:  SNF (31)   CODE STATUS: dnr  No Known Allergies  Chief Complaint  Patient presents with   Medical Management of Chronic Issues      Simple chronic bronchitis: Chronic constipation: Hypokalemia:     HPI:  She is a 89 year old long term resident of this facility being seen for the management of her chronic illnesses:Simple chronic bronchitis: Chronic constipation: Hypokalemia: there are no reports of uncontrolled pain. She has had episodes of agitation and is now on rexulti with some benefit. She has had numerous falls; without injury   Past Medical History:  Diagnosis Date   Diverticulosis OCT 2010 TCS   SIGMOID COLON   Esophageal web 2010   DILATION 16 MM   Gastritis, Helicobacter pylori [id number]   Rx. ABO for 10 days    GERD (gastroesophageal reflux disease)    Hip fracture, left ([place])    2016   Hyperlipidemia    Hypertension    Restless legs syndrome    Seizures ([place]) January 2010   Post head trauma in fall .sees [name]    Past Surgical History:  Procedure Laterality Date   ABDOMINAL HYSTERECTOMY     COLONOSCOPY  OCT 2010   TORTUOUS, Sml IH, Tics, MULTIPLE SIMPLE ADENOMAS (<6MM)   HIP ARTHROPLASTY Left 11/19/2014   Procedure: LEFT PARTIAL HIP REPLACEMENT;  Surgeon: [name], MD;  Location: [facility];  Service: Orthopedics;  Laterality: Left;   ORIF HIP FRACTURE  04/05/2012   Procedure: OPEN REDUCTION INTERNAL FIXATION HIP;  Surgeon: [name], MD;  Location: [facility];  Service: Orthopedics;  Laterality: Right;   UPPER GASTROINTESTINAL ENDOSCOPY  AUG 2010   SAVARY   VESICOVAGINAL FISTULA CLOSURE W/ TAH  1966   bleeding     Social History   Socioeconomic History   Marital status: Widowed    Spouse name: Not on file   Number of children: Not on file   Years of education: Not on file   Highest education level: Not on file  Occupational History   Occupation:  custodian / nutritionist -Market researcher    Occupation: retired   Tobacco Use   Smoking status: Never   Smokeless tobacco: Never  Scientific laboratory technician Use: Never used  Substance and Sexual Activity   Alcohol use: No   Drug use: No   Sexual activity: Not Currently  Other Topics Concern   Not on file  Social History Narrative   Long term resident of [facility]    Social Determinants of Health   Financial Resource Strain: [place]  (07/14/2018)   Overall Financial Resource Strain (CARDIA)    Difficulty of Paying Living Expenses: Not hard at all  Food Insecurity: No Food Insecurity (07/14/2018)   Hunger Vital Sign    Worried About Running Out of Food in the Last Year: Never true    [place] in the Last Year: Never true  Transportation Needs: No Transportation Needs (07/14/2018)   PRAPARE - Hydrologist (Medical): No    Lack of Transportation (Non-Medical): No  Physical Activity: Insufficiently Active (07/14/2018)   Exercise Vital Sign    Days of Exercise per Week: 4 days    Minutes of Exercise per Session: 20 min  Stress: Stress Concern Present (07/14/2018)   [place]    Feeling of Stress :  To some extent  Social Connections: Moderately Isolated (07/14/2018)   Social Connection and Isolation Panel [NHANES]    Frequency of Communication with Friends and Family: Twice a week    Frequency of Social Gatherings with Friends and Family: Once a week    Attends Religious Services: Never    Marine scientist or Organizations: No    Attends Archivist Meetings: Never    Marital Status: Widowed  Intimate Partner Violence: Not At Risk (07/14/2018)   Humiliation, Afraid, Rape, and Kick questionnaire    Fear of Current or Ex-Partner: No    Emotionally Abused: No    Physically Abused: No    Sexually Abused: No   Family History  Problem Relation Age of Onset   Cancer Mother         bladder    Diabetes Mother    Diabetes Father    Emphysema Father    GER disease Brother    Colon cancer Neg Hx    Colon polyps Neg Hx       VITAL SIGNS BP 102/72   Pulse 74   Temp 97.6 F (36.4 C)   Resp 18   Ht 5\' 7"  (1.702 m)   Wt 220 lb (99.8 kg)   SpO2 99%   BMI 34.46 kg/m   Outpatient Encounter Medications as of 01/02/2023  Medication Sig   Acetaminophen (TYLENOL ARTHRITIS PAIN PO) Take 650 mg by mouth in the morning and at bedtime.   aspirin 81 MG chewable tablet Chew 81 mg by mouth daily.   bisacodyl (DULCOLAX) 10 MG suppository Place 10 mg rectally as needed.   Brexpiprazole (REXULTI) 0.5 MG TABS Take 0.5 mg by mouth daily at 2 PM.   calcium carbonate (TUMS - DOSED IN MG ELEMENTAL CALCIUM) 500 MG chewable tablet Chew 1 tablet by mouth 3 (three) times daily. DX age related osteoporosis without current pathological fracture 07/10/2018 per Dr.Gupta note   Carbidopa-Levodopa ER (SINEMET CR) 25-100 MG tablet controlled release Take 1 tablet by mouth 3 (three) times daily.   FLUoxetine (PROZAC) 40 MG capsule Take 40 mg by mouth daily.   fluticasone-salmeterol (ADVAIR) 100-50 MCG/ACT AEPB Inhale 1 puff into the lungs 2 (two) times daily. Special Instructions: RINSE MOUTH AFTER USE. DISCARD AFTER 1 MONTH   levETIRAcetam (KEPPRA) 250 MG tablet Take 250 mg by mouth 2 (two) times daily. Take 250 mg in the AM and 500 mg in the PM   Melatonin 5 MG TABS Take 5 mg by mouth at bedtime.   memantine (NAMENDA) 10 MG tablet Take 10 mg by mouth 2 (two) times daily.   NON FORMULARY Diet type: Regular   sennosides-docusate sodium (SENOKOT-S) 8.6-50 MG tablet Take 1 tablet by mouth in the morning and at bedtime. For Constipation   zinc oxide 20 % ointment Apply 1 Application topically 3 (three) times daily. Apply to sacrum, coccyx and bilateral buttocks   No facility-administered encounter medications on file as of 01/02/2023.     SIGNIFICANT DIAGNOSTIC EXAMS  PREVIOUS  09-07-19:  DEXA: t score -1.06  01-07-22: chest x-ray: mild CHF    NO NEW EXAMS.   LABS REVIEWED PREVIOUS  01-07-22: wbc 9.0; hgb 13.0; hct 40.9; mcv 91.7 plt 143; glucose 116; bun 20; creat 0.73 ;k+ 3.5; na++ 138; ca 8.6; GFR>60; d-dimer 0.94; CRP 10.5  01-15-22: CRP 0.7 02-01-22: hepatitis C nr  10-04-22: wbc 4.6; hgb 12.0; hct 37.9; mcv 94.5 plt 157; glucose 75; bun 21; creat 0.70; k+ 3.6;  na++ 143; ca 8.2 gfr >60; protein 6.0; albumin 3.3 chol 137; ldl 86; trig 55; hdl 40 12-11-22: wbc 4.5; hgb 13.8; hct 43.3; mcv 93.5 plt 189; glucose 75 bun 18; creat 0.69; k+ 3.1; na++ 141; ca 9.1; protein 7.4 albumin 4.0; tsh 2.415; vitamin B 12: 412  keppra 8.1  12-17-22: glucose 77; bun 19; creat 0.69; k+ 3.2; na++ 139; ca 8.0; gfr >60 12-24-22: glucose 77; bun 22; creat 0.75; k+ 3.7; na++ 140; ca 8.1; gfr >60  NO NEW LABS.   Review of Systems  Constitutional:  Negative for malaise/fatigue.  Respiratory:  Negative for cough and shortness of breath.   Cardiovascular:  Negative for chest pain, palpitations and leg swelling.  Gastrointestinal:  Negative for abdominal pain, constipation and heartburn.  Musculoskeletal:  Negative for back pain, joint pain and myalgias.  Skin: Negative.   Neurological:  Negative for dizziness.  Psychiatric/Behavioral:  The patient is not nervous/anxious.     Physical Exam Constitutional:      General: She is not in acute distress.    Appearance: She is well-developed. She is obese. She is not diaphoretic.  Neck:     Thyroid: No thyromegaly.  Cardiovascular:     Rate and Rhythm: Normal rate and regular rhythm.     Pulses: Normal pulses.     Heart sounds: Normal heart sounds.  Pulmonary:     Effort: Pulmonary effort is normal. No respiratory distress.     Breath sounds: Normal breath sounds.  Abdominal:     General: Bowel sounds are normal. There is no distension.     Palpations: Abdomen is soft.     Tenderness: There is no abdominal tenderness.  Musculoskeletal:      Cervical back: Neck supple.     Right lower leg: No edema.     Left lower leg: No edema.     Comments: Able to move all extremities   Lymphadenopathy:     Cervical: No cervical adenopathy.  Skin:    General: Skin is warm and dry.  Neurological:     Mental Status: She is alert. Mental status is at baseline.  Psychiatric:        Mood and Affect: Mood normal.      ASSESSMENT/ PLAN:  TODAY:   Simple chronic bronchitis: without flares: will continue advair 150/50 1 puff twice daily   2. Chronic constipation: will continue senna s daily   3. Hypokalemia:  k+ 3.7 is off supplement   PREVIOUS      4. Unspecified hyperlipidemia: LDL 89; will continue mevacor 20 mg daily   5. Morbid obesity: hyperlipidemia: BMI 34.46  6. Unspecified obsessive compulsive disorder: will continue prozac 40 mg daily (has failed 2 weans) melatonin 5 mg nightly   7. Parkinson's disease: will continue sinemet cr 25/100 mg three times daily   8. Chronic generalized pain: will continue tylenol 650 mg twice daily   9. Seizure disorder (after CHI) no recent seizures: will continue keppra 250 mg in the AM and 500 mg in the PM   10.  Thrombocytopenia: plt 142  11. Essential hypertension: b/p 102/72 will continue to monitor   12. Dementia with agitation : weight is 220 pounds will continue namenda 5 mg twice daily will continue rexulti 0.5 mg daily for agitation.     Ok Edwards NP [facility]  call [phone]

## 2023-01-09 ENCOUNTER — Non-Acute Institutional Stay (SKILLED_NURSING_FACILITY): Payer: Medicare PPO | Admitting: Adult Health

## 2023-01-09 DIAGNOSIS — G20A1 Parkinson's disease without dyskinesia, without mention of fluctuations: Secondary | ICD-10-CM

## 2023-01-09 DIAGNOSIS — R634 Abnormal weight loss: Secondary | ICD-10-CM | POA: Diagnosis not present

## 2023-01-09 DIAGNOSIS — F01C11 Vascular dementia, severe, with agitation: Secondary | ICD-10-CM

## 2023-01-10 ENCOUNTER — Encounter: Payer: Self-pay | Admitting: Adult Health

## 2023-01-10 NOTE — Progress Notes (Signed)
Location:  [facility] Room Number: 75 D Place of Service:  SNF (31)   CODE STATUS: DNR  No Known Allergies  Chief Complaint  Patient presents with   Acute Visit    Weight issues    HPI:  She is losing weight. Her weight 6 months ago was 228.4 pounds current weight os 218.6 pounds. She has lost 22 pounds over the past year. Her appetite is fair. She normally eats all of her meals. She does enjoy her junk food. She is sundowning with delusions about children. She has had falls over the past 6 months without injury.   Past Medical History:  Diagnosis Date   Diverticulosis OCT 2010 TCS   SIGMOID COLON   Esophageal web 2010   DILATION 16 MM   Gastritis, Helicobacter pylori 2010   Rx. ABO for 10 days    GERD (gastroesophageal reflux disease)    Hip fracture, left    2016   Hyperlipidemia    Hypertension    Restless legs syndrome    Seizures January 2010   Post head trauma in fall .sees Dr Gerilyn Pilgrim    Past Surgical History:  Procedure Laterality Date   ABDOMINAL HYSTERECTOMY     COLONOSCOPY  OCT 2010   TORTUOUS, Sml IH, Tics, MULTIPLE SIMPLE ADENOMAS (<6MM)   HIP ARTHROPLASTY Left 11/19/2014   Procedure: LEFT PARTIAL HIP REPLACEMENT;  Surgeon: Vickki Hearing, MD;  Location: [facility];  Service: Orthopedics;  Laterality: Left;   ORIF HIP FRACTURE  04/05/2012   Procedure: OPEN REDUCTION INTERNAL FIXATION HIP;  Surgeon: Vickki Hearing, MD;  Location: [facility];  Service: Orthopedics;  Laterality: Right;   UPPER GASTROINTESTINAL ENDOSCOPY  AUG 2010   SAVARY   VESICOVAGINAL FISTULA CLOSURE W/ TAH  1966   bleeding     Social History   Socioeconomic History   Marital status: Widowed    Spouse name: Not on file   Number of children: Not on file   Years of education: Not on file   Highest education level: Not on file  Occupational History   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 agent    Occupation: retired   Tobacco Use   Smoking  status: Never   Smokeless tobacco: Never  Building services engineer Use: Never used  Substance and Sexual Activity   Alcohol use: No   Drug use: No   Sexual activity: Not Currently  Other Topics Concern   Not on file  Social History Narrative   Long term resident of [facility]    Social Determinants of Health   Financial Resource Strain: Low Risk  (07/14/2018)   Overall Financial Resource Strain (CARDIA)    Difficulty of Paying Living Expenses: Not hard at all  Food Insecurity: No Food Insecurity (07/14/2018)   Hunger Vital Sign    Worried About Running Out of Food in the Last Year: Never true    Ran Out of Food in the Last Year: Never true  Transportation Needs: No Transportation Needs (07/14/2018)   PRAPARE - Administrator, Civil Service (Medical): No    Lack of Transportation (Non-Medical): No  Physical Activity: Insufficiently Active (07/14/2018)   Exercise Vital Sign    Days of Exercise per Week: 4 days    Minutes of Exercise per Session: 20 min  Stress: Stress Concern Present (07/14/2018)   [employer] of Occupational Health - Occupational Stress Questionnaire    Feeling of Stress : To some extent  Social Connections: Moderately Isolated (07/14/2018)   Social Connection and Isolation Panel [NHANES]    Frequency of Communication with Friends and Family: Twice a week    Frequency of Social Gatherings with Friends and Family: Once a week    Attends Religious Services: Never    Database administrator or Organizations: No    Attends Banker Meetings: Never    Marital Status: Widowed  Intimate Partner Violence: Not At Risk (07/14/2018)   Humiliation, Afraid, Rape, and Kick questionnaire    Fear of Current or Ex-Partner: No    Emotionally Abused: No    Physically Abused: No    Sexually Abused: No   Family History  Problem Relation Age of Onset   Cancer Mother        bladder    Diabetes Mother    Diabetes Father    Emphysema Father    GER disease  Brother    Colon cancer Neg Hx    Colon polyps Neg Hx       VITAL SIGNS BP 130/62   Pulse 82   Temp (!) 95.8 F (35.4 C)   Resp 20   Ht  (1.702 m)   Wt 218 lb 9.6 oz (99.2 kg)   SpO2 96%   BMI 34.24 kg/m   Outpatient Encounter Medications as of 01/09/2023  Medication Sig   Acetaminophen (TYLENOL ARTHRITIS PAIN PO) Take 650 mg by mouth in the morning and at bedtime.   aspirin 81 MG chewable tablet Chew 81 mg by mouth daily.   bisacodyl (DULCOLAX) 10 MG suppository Place 10 mg rectally as needed.   Brexpiprazole (REXULTI) 0.5 MG TABS Take 0.5 mg by mouth daily at 2 PM.   Carbidopa-Levodopa ER (SINEMET CR) 25-100 MG tablet controlled release Take 1 tablet by mouth 3 (three) times daily.   FLUoxetine (PROZAC) 40 MG capsule Take 40 mg by mouth daily.   fluticasone-salmeterol (ADVAIR) 100-50 MCG/ACT AEPB Inhale 1 puff into the lungs 2 (two) times daily. Special Instructions: RINSE MOUTH AFTER USE. DISCARD AFTER 1 MONTH   levETIRAcetam (KEPPRA) 250 MG tablet Take 250 mg by mouth daily. Take 250 mg in the AM and 500 mg in the PM   Melatonin 5 MG TABS Take 5 mg by mouth at bedtime.   memantine (NAMENDA) 10 MG tablet Take 10 mg by mouth 2 (two) times daily.   NON FORMULARY Diet type: Regular   potassium chloride (KLOR-CON) 10 MEQ tablet Take 10 mEq by mouth daily.   sennosides-docusate sodium (SENOKOT-S) 8.6-50 MG tablet Take 1 tablet by mouth in the morning and at bedtime. For Constipation   zinc oxide 20 % ointment Apply 1 Application topically 3 (three) times daily. Apply to sacrum, coccyx and bilateral buttocks   [DISCONTINUED] calcium carbonate (TUMS - DOSED IN MG ELEMENTAL CALCIUM) 500 MG chewable tablet Chew 1 tablet by mouth 3 (three) times daily. DX age related osteoporosis without current pathological fracture 07/10/2018 per Dr.Gupta note   No facility-administered encounter medications on file as of 01/09/2023.     SIGNIFICANT DIAGNOSTIC EXAMS  PREVIOUS  09-07-19: DEXA:  t score -1.06  01-07-22: chest x-ray: mild CHF    NO NEW EXAMS.   LABS REVIEWED PREVIOUS  01-07-22: wbc 9.0; hgb 13.0; hct 40.9; mcv 91.7 plt 143; glucose 116; bun 20; creat 0.73 ;k+ 3.5; na++ 138; ca 8.6; GFR>60; d-dimer 0.94; CRP 10.5  01-15-22: CRP 0.7 02-01-22: hepatitis C nr  10-04-22: wbc 4.6; hgb 12.0; hct 37.9; mcv  94.5 plt 157; glucose 75; bun 21; creat 0.70; k+ 3.6; na++ 143; ca 8.2 gfr >60; protein 6.0; albumin 3.3 chol 137; ldl 86; trig 55; hdl 40 12-11-22: wbc 4.5; hgb 13.8; hct 43.3; mcv 93.5 plt 189; glucose 75 bun 18; creat 0.69; k+ 3.1; na++ 141; ca 9.1; protein 7.4 albumin 4.0; tsh 2.415; vitamin B 12: 412  keppra 8.1  12-17-22: glucose 77; bun 19; creat 0.69; k+ 3.2; na++ 139; ca 8.0; gfr >60 12-24-22: glucose 77; bun 22; creat 0.75; k+ 3.7; na++ 140; ca 8.1; gfr >60  NO NEW LABS.   Review of Systems  Constitutional:  Negative for malaise/fatigue.  Respiratory:  Negative for cough and shortness of breath.   Cardiovascular:  Negative for chest pain, palpitations and leg swelling.  Gastrointestinal:  Negative for abdominal pain, constipation and heartburn.  Musculoskeletal:  Negative for back pain, joint pain and myalgias.  Skin: Negative.   Neurological:  Negative for dizziness.  Psychiatric/Behavioral:  The patient is not nervous/anxious.     Physical Exam Constitutional:      General: She is not in acute distress.    Appearance: She is well-developed. She is obese. She is not diaphoretic.  Neck:     Thyroid: No thyromegaly.  Cardiovascular:     Rate and Rhythm: Normal rate and regular rhythm.     Pulses: Normal pulses.     Heart sounds: Normal heart sounds.  Pulmonary:     Effort: Pulmonary effort is normal. No respiratory distress.     Breath sounds: Normal breath sounds.  Abdominal:     General: Bowel sounds are normal. There is no distension.     Palpations: Abdomen is soft.     Tenderness: There is no abdominal tenderness.  Musculoskeletal:     Cervical  back: Neck supple.     Right lower leg: No edema.     Left lower leg: No edema.     Comments: Able to move all extremities   Lymphadenopathy:     Cervical: No cervical adenopathy.  Skin:    General: Skin is warm and dry.  Neurological:     Mental Status: She is alert. Mental status is at baseline.  Psychiatric:        Mood and Affect: Mood normal.       ASSESSMENT/ PLAN:  TODAY  Weight loss unintentional Severe vascular dementia with agitation Parkinson's disease without dyskinesia unspecified whether manifestations fluctuate  Will stop her tums Will give her more "junk food" on her trays to encourage her to eat.  Will monitor her status.    Synthia Innocent NP [facility]   call [phone]

## 2023-01-23 ENCOUNTER — Encounter: Payer: Self-pay | Admitting: Adult Health

## 2023-01-23 ENCOUNTER — Non-Acute Institutional Stay (SKILLED_NURSING_FACILITY): Payer: Medicare PPO | Admitting: Adult Health

## 2023-01-23 DIAGNOSIS — F422 Mixed obsessional thoughts and acts: Secondary | ICD-10-CM

## 2023-01-23 DIAGNOSIS — E669 Obesity, unspecified: Secondary | ICD-10-CM

## 2023-01-23 DIAGNOSIS — F22 Delusional disorders: Secondary | ICD-10-CM

## 2023-01-23 DIAGNOSIS — E785 Hyperlipidemia, unspecified: Secondary | ICD-10-CM

## 2023-01-23 DIAGNOSIS — R443 Hallucinations, unspecified: Secondary | ICD-10-CM

## 2023-01-23 NOTE — Progress Notes (Signed)
Location:  [facility] Room Number: 116D Place of Service:  SNF (31)   CODE STATUS: DNR  No Known Allergies  Chief Complaint  Patient presents with   Medical Management of Chronic Issues              Hallucination and delusion:  Unspecified hyperlipidemia:   Unspecified obsessive compulsive disorder:   Morbid obesity:     HPI:  She is a 89 year old long term resident of this facility being seen for the management of her chronic illnesses:  Hallucination and delusion:  Unspecified hyperlipidemia:   Unspecified obsessive compulsive disorder:   Morbid obesity. There are no reports of uncontrolled pain. She has had numerous falls over the past several months without injury present. Her mood state has improved with rexulti.   Past Medical History:  Diagnosis Date   Diverticulosis OCT 2010 TCS   SIGMOID COLON   Esophageal web 2010   DILATION 16 MM   Gastritis, Helicobacter pylori 2010   Rx. ABO for 10 days    GERD (gastroesophageal reflux disease)    Hip fracture, left    2016   Hyperlipidemia    Hypertension    Restless legs syndrome    Seizures January 2010   Post head trauma in fall .sees Dr Gerilyn Pilgrim    Past Surgical History:  Procedure Laterality Date   ABDOMINAL HYSTERECTOMY     COLONOSCOPY  OCT 2010   TORTUOUS, Sml IH, Tics, MULTIPLE SIMPLE ADENOMAS (<6MM)   HIP ARTHROPLASTY Left 11/19/2014   Procedure: LEFT PARTIAL HIP REPLACEMENT;  Surgeon: Vickki Hearing, MD;  Location: [facility];  Service: Orthopedics;  Laterality: Left;   ORIF HIP FRACTURE  04/05/2012   Procedure: OPEN REDUCTION INTERNAL FIXATION HIP;  Surgeon: Vickki Hearing, MD;  Location: [facility];  Service: Orthopedics;  Laterality: Right;   UPPER GASTROINTESTINAL ENDOSCOPY  AUG 2010   SAVARY   VESICOVAGINAL FISTULA CLOSURE W/ TAH  1966   bleeding     Social History   Socioeconomic History   Marital status: Widowed    Spouse name: Not on file   Number of children: Not on file    Years of education: Not on file   Highest education level: Not on file  Occupational History   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 agent    Occupation: retired   Tobacco Use   Smoking status: Never   Smokeless tobacco: Never  Building services engineer Use: Never used  Substance and Sexual Activity   Alcohol use: No   Drug use: No   Sexual activity: Not Currently  Other Topics Concern   Not on file  Social History Narrative   Long term resident of [facility]    Social Determinants of Health   Financial Resource Strain: Low Risk  (07/14/2018)   Overall Financial Resource Strain (CARDIA)    Difficulty of Paying Living Expenses: Not hard at all  Food Insecurity: No Food Insecurity (07/14/2018)   Hunger Vital Sign    Worried About Running Out of Food in the Last Year: Never true    Ran Out of Food in the Last Year: Never true  Transportation Needs: No Transportation Needs (07/14/2018)   PRAPARE - Administrator, Civil Service (Medical): No    Lack of Transportation (Non-Medical): No  Physical Activity: Insufficiently Active (07/14/2018)   Exercise Vital Sign    Days of Exercise per Week: 4 days    Minutes of Exercise per  Session: 20 min  Stress: Stress Concern Present (07/14/2018)   [employer] of Occupational Health - Occupational Stress Questionnaire    Feeling of Stress : To some extent  Social Connections: Moderately Isolated (07/14/2018)   Social Connection and Isolation Panel [NHANES]    Frequency of Communication with Friends and Family: Twice a week    Frequency of Social Gatherings with Friends and Family: Once a week    Attends Religious Services: Never    Database administrator or Organizations: No    Attends Banker Meetings: Never    Marital Status: Widowed  Intimate Partner Violence: Not At Risk (07/14/2018)   Humiliation, Afraid, Rape, and Kick questionnaire    Fear of Current or Ex-Partner: No    Emotionally Abused: No     Physically Abused: No    Sexually Abused: No   Family History  Problem Relation Age of Onset   Cancer Mother        bladder    Diabetes Mother    Diabetes Father    Emphysema Father    GER disease Brother    Colon cancer Neg Hx    Colon polyps Neg Hx       VITAL SIGNS BP 123/61   Pulse (!) 55   Temp 98.6 F (37 C)   Resp 20   Ht 5\' 7"  (1.702 m)   Wt 218 lb 6 oz (99.1 kg)   SpO2 98%   BMI 34.20 kg/m   Outpatient Encounter Medications as of 01/23/2023  Medication Sig   Acetaminophen (TYLENOL ARTHRITIS PAIN PO) Take 650 mg by mouth in the morning and at bedtime.   aspirin 81 MG chewable tablet Chew 81 mg by mouth daily.   bisacodyl (DULCOLAX) 10 MG suppository Place 10 mg rectally as needed.   Brexpiprazole (REXULTI) 0.5 MG TABS Take 0.5 mg by mouth daily at 2 PM.   Carbidopa-Levodopa ER (SINEMET CR) 25-100 MG tablet controlled release Take 1 tablet by mouth 3 (three) times daily.   FLUoxetine (PROZAC) 40 MG capsule Take 40 mg by mouth daily.   fluticasone-salmeterol (ADVAIR) 100-50 MCG/ACT AEPB Inhale 1 puff into the lungs 2 (two) times daily. Special Instructions: RINSE MOUTH AFTER USE. DISCARD AFTER 1 MONTH   levETIRAcetam (KEPPRA) 250 MG tablet Take 250 mg by mouth daily. Take 250 mg in the AM and 500 mg in the PM   Melatonin 5 MG TABS Take 5 mg by mouth at bedtime.   memantine (NAMENDA) 10 MG tablet Take 10 mg by mouth 2 (two) times daily.   NON FORMULARY Diet type: Regular   potassium chloride (KLOR-CON) 10 MEQ tablet Take 10 mEq by mouth daily.   sennosides-docusate sodium (SENOKOT-S) 8.6-50 MG tablet Take 1 tablet by mouth in the morning and at bedtime. For Constipation   zinc oxide 20 % ointment Apply 1 Application topically 3 (three) times daily. Apply to sacrum, coccyx and bilateral buttocks   No facility-administered encounter medications on file as of 01/23/2023.     SIGNIFICANT DIAGNOSTIC EXAMS  PREVIOUS  09-07-19: DEXA: t score -1.06  01-07-22: chest  x-ray: mild CHF    NO NEW EXAMS.   LABS REVIEWED PREVIOUS  01-07-22: wbc 9.0; hgb 13.0; hct 40.9; mcv 91.7 plt 143; glucose 116; bun 20; creat 0.73 ;k+ 3.5; na++ 138; ca 8.6; GFR>60; d-dimer 0.94; CRP 10.5  01-15-22: CRP 0.7 02-01-22: hepatitis C nr  10-04-22: wbc 4.6; hgb 12.0; hct 37.9; mcv 94.5 plt 157; glucose 75; bun  21; creat 0.70; k+ 3.6; na++ 143; ca 8.2 gfr >60; protein 6.0; albumin 3.3 chol 137; ldl 86; trig 55; hdl 40 12-11-22: wbc 4.5; hgb 13.8; hct 43.3; mcv 93.5 plt 189; glucose 75 bun 18; creat 0.69; k+ 3.1; na++ 141; ca 9.1; protein 7.4 albumin 4.0; tsh 2.415; vitamin B 12: 412  keppra 8.1  12-17-22: glucose 77; bun 19; creat 0.69; k+ 3.2; na++ 139; ca 8.0; gfr >60 12-24-22: glucose 77; bun 22; creat 0.75; k+ 3.7; na++ 140; ca 8.1; gfr >60  TODAY  12-27-22: wbc 4.1; hgb 11.9; hct 36.9; mcv 92.7 plt 148 keppra 12.9    Review of Systems  Constitutional:  Negative for malaise/fatigue.  Respiratory:  Negative for cough and shortness of breath.   Cardiovascular:  Negative for chest pain, palpitations and leg swelling.  Gastrointestinal:  Negative for abdominal pain, constipation and heartburn.  Musculoskeletal:  Negative for back pain, joint pain and myalgias.  Skin: Negative.   Neurological:  Negative for dizziness.  Psychiatric/Behavioral:  The patient is not nervous/anxious.    Physical Exam Constitutional:      General: She is not in acute distress.    Appearance: She is well-developed. She is obese. She is not diaphoretic.  Neck:     Thyroid: No thyromegaly.  Cardiovascular:     Rate and Rhythm: Normal rate and regular rhythm.     Pulses: Normal pulses.     Heart sounds: Normal heart sounds.  Pulmonary:     Effort: Pulmonary effort is normal. No respiratory distress.     Breath sounds: Normal breath sounds.  Abdominal:     General: Bowel sounds are normal. There is no distension.     Palpations: Abdomen is soft.     Tenderness: There is no abdominal tenderness.   Musculoskeletal:     Cervical back: Neck supple.     Right lower leg: No edema.     Left lower leg: No edema.     Comments: Able to move all extremities   Lymphadenopathy:     Cervical: No cervical adenopathy.  Skin:    General: Skin is warm and dry.  Neurological:     Mental Status: She is alert. Mental status is at baseline.  Psychiatric:        Mood and Affect: Mood normal.      ASSESSMENT/ PLAN:  TODAY:   Hallucination and delusion: her mood state is stable will continue rexulti 0.5 mg daily   2. Unspecified hyperlipidemia: LDL 89 is off statin due to advanced age   63. Unspecified obsessive compulsive disorder: will continue prozac 40 mg daily (has failed 2 weans)   4. Morbid obesity: BMI 34.20  PREVIOUS     5. Parkinson's disease: will continue sinemet cr 25/100 mg three times daily   6. Chronic generalized pain: will continue tylenol 650 mg twice daily   7. Seizure disorder (after CHI) no recent seizures: will continue keppra 250 mg in the AM and 500 mg in the PM   8.  Thrombocytopenia: plt 148  9. Essential hypertension: b/p 123/61 will continue to monitor   10. Dementia with agitation : weight is 218 pounds will continue namenda 5 mg twice daily will continue rexulti 0.5 mg daily for agitation.   11. Simple chronic bronchitis: without flares: will continue advair 100/50 1 puff twice daily   12. Chronic constipation: will continue senna s daily   13. Hypokalemia:  k+ 3.7 is off supplement    Synthia Innocent NP  [facility]  call [phone]

## 2023-01-29 DIAGNOSIS — R443 Hallucinations, unspecified: Secondary | ICD-10-CM | POA: Insufficient documentation

## 2023-01-29 DIAGNOSIS — F22 Delusional disorders: Secondary | ICD-10-CM | POA: Insufficient documentation

## 2023-01-31 ENCOUNTER — Non-Acute Institutional Stay (SKILLED_NURSING_FACILITY): Payer: Medicare PPO | Admitting: Adult Health

## 2023-01-31 ENCOUNTER — Encounter: Payer: Self-pay | Admitting: Adult Health

## 2023-01-31 DIAGNOSIS — Z0000 Encounter for general adult medical examination without abnormal findings: Secondary | ICD-10-CM | POA: Diagnosis not present

## 2023-01-31 NOTE — Patient Instructions (Signed)
  Alejandra Cruz , Thank you for taking time to come for your Medicare Wellness Visit. I appreciate your ongoing commitment to your health goals. Please review the following plan we discussed and let me know if I can assist you in the future.   These are the goals we discussed:  Goals      Absence of Fall and Fall-Related Injury     Evidence-based guidance:  Assess fall risk using a validated tool when available. Consider balance and gait impairment, muscle weakness, diminished vision or hearing, environmental hazards, presence of urinary or bowel urgency and/or incontinence.  Communicate fall injury risk to interprofessional healthcare team.  Develop a fall prevention plan with the patient and family.  Promote use of personal vision and auditory aids.  Promote reorientation, appropriate sensory stimulation, and routines to decrease risk of fall when changes in mental status are present.  Assess assistance level required for safe and effective self-care; consider referral for home care.  Encourage physical activity, such as performance of self-care at highest level of ability, strength and balance exercise program, and provision of appropriate assistive devices; refer to rehabilitation therapy.  Refer to community-based fall prevention program where available.  If fall occurs, determine the cause and revise fall injury prevention plan.  Regularly review medication contribution to fall risk; consider risk related to polypharmacy and age.  Refer to pharmacist for consultation when concerns about medications are revealed.  Balance adequate pain management with potential for oversedation.  Provide guidance related to environmental modifications.  Consider supplementation with Vitamin D.   Notes:      DIET - INCREASE WATER INTAKE     General - Client will not be readmitted within 30 days (C-SNP)        This is a list of the screening recommended for you and due dates:  Health Maintenance  Topic  Date Due   Flu Shot  05/09/2023   Medicare Annual Wellness Visit  01/31/2024   DTaP/Tdap/Td vaccine (3 - Td or Tdap) 06/21/2027   Pneumonia Vaccine  Completed   DEXA scan (bone density measurement)  Completed   Zoster (Shingles) Vaccine  Completed   HPV Vaccine  Aged Out   COVID-19 Vaccine  Discontinued

## 2023-01-31 NOTE — Progress Notes (Signed)
Subjective:   Alejandra Cruz is a 89 y.o. female who presents for Medicare Annual (Subsequent) preventive examination.  Review of Systems    Review of Systems  Constitutional:  Negative for malaise/fatigue.  Respiratory:  Negative for cough and shortness of breath.   Cardiovascular:  Negative for chest pain, palpitations and leg swelling.  Gastrointestinal:  Negative for abdominal pain, constipation and heartburn.  Musculoskeletal:  Negative for back pain, joint pain and myalgias.  Skin: Negative.   Neurological:  Negative for dizziness.  Psychiatric/Behavioral:  The patient is not nervous/anxious.     Cardiac Risk Factors include: advanced age (>65men, >53 women);hypertension;obesity (BMI >30kg/m2);sedentary lifestyle     Objective:    Today's Vitals   01/31/23 1020  BP: 113/69  Pulse: 63  Resp: 17  Temp: 97.6 F (36.4 C)  TempSrc: Temporal  SpO2: 98%  Weight: 218 lb 9.6 oz (99.2 kg)  Height:  (1.702 m)   Body mass index is 34.24 kg/m.     01/31/2023   10:23 AM 01/23/2023    8:36 AM 12/28/2022    2:28 PM 12/24/2022    2:48 PM 12/12/2022   12:02 PM 11/20/2022    4:16 PM 11/01/2022   11:25 AM  Advanced Directives  Does Patient Have a Medical Advance Directive? Yes Yes No No No No No  Type of Advance Directive Out of facility DNR (pink MOST or yellow form) Out of facility DNR (pink MOST or yellow form)       Does patient want to make changes to medical advance directive? No - Patient declined No - Patient declined  No - Patient declined No - Patient declined No - Patient declined No - Patient declined  Pre-existing out of facility DNR order (yellow form or pink MOST form) Pink MOST form placed in chart (order not valid for inpatient use);Yellow form placed in chart (order not valid for inpatient use) Pink MOST form placed in chart (order not valid for inpatient use);Yellow form placed in chart (order not valid for inpatient use)         Current Medications  (verified) Outpatient Encounter Medications as of 01/31/2023  Medication Sig   Acetaminophen (TYLENOL ARTHRITIS PAIN PO) Take 650 mg by mouth in the morning and at bedtime.   aspirin 81 MG chewable tablet Chew 81 mg by mouth daily.   bisacodyl (DULCOLAX) 10 MG suppository Place 10 mg rectally as needed.   Brexpiprazole (REXULTI) 0.5 MG TABS Take 0.5 mg by mouth daily at 2 PM.   Carbidopa-Levodopa ER (SINEMET CR) 25-100 MG tablet controlled release Take 1 tablet by mouth 3 (three) times daily.   FLUoxetine (PROZAC) 40 MG capsule Take 40 mg by mouth daily.   fluticasone-salmeterol (ADVAIR) 100-50 MCG/ACT AEPB Inhale 1 puff into the lungs 2 (two) times daily. Special Instructions: RINSE MOUTH AFTER USE. DISCARD AFTER 1 MONTH   levETIRAcetam (KEPPRA) 250 MG tablet Take 250 mg by mouth daily. Take 250 mg in the AM and 500 mg in the PM   Melatonin 5 MG TABS Take 5 mg by mouth at bedtime.   memantine (NAMENDA) 10 MG tablet Take 10 mg by mouth 2 (two) times daily.   NON FORMULARY Diet type: Regular   potassium chloride (KLOR-CON) 10 MEQ tablet Take 10 mEq by mouth daily.   sennosides-docusate sodium (SENOKOT-S) 8.6-50 MG tablet Take 1 tablet by mouth in the morning and at bedtime. For Constipation   zinc oxide 20 % ointment Apply 1 Application topically  3 (three) times daily. Apply to sacrum, coccyx and bilateral buttocks   No facility-administered encounter medications on file as of 01/31/2023.    Allergies (verified) Patient has no known allergies.   History: Past Medical History:  Diagnosis Date   Diverticulosis OCT 2010 TCS   SIGMOID COLON   Esophageal web 2010   DILATION 16 MM   Gastritis, Helicobacter pylori 2010   Rx. ABO for 10 days    GERD (gastroesophageal reflux disease)    Hip fracture, left    2016   Hyperlipidemia    Hypertension    Restless legs syndrome    Seizures January 2010   Post head trauma in fall .sees Dr Gerilyn Pilgrim   Past Surgical History:  Procedure Laterality  Date   ABDOMINAL HYSTERECTOMY     COLONOSCOPY  OCT 2010   TORTUOUS, Sml IH, Tics, MULTIPLE SIMPLE ADENOMAS (<6MM)   HIP ARTHROPLASTY Left 11/19/2014   Procedure: LEFT PARTIAL HIP REPLACEMENT;  Surgeon: Vickki Hearing, MD;  Location: [facility];  Service: Orthopedics;  Laterality: Left;   ORIF HIP FRACTURE  04/05/2012   Procedure: OPEN REDUCTION INTERNAL FIXATION HIP;  Surgeon: Vickki Hearing, MD;  Location: [facility];  Service: Orthopedics;  Laterality: Right;   UPPER GASTROINTESTINAL ENDOSCOPY  AUG 2010   SAVARY   VESICOVAGINAL FISTULA CLOSURE W/ TAH  1966   bleeding    Family History  Problem Relation Age of Onset   Cancer Mother        bladder    Diabetes Mother    Diabetes Father    Emphysema Father    GER disease Brother    Colon cancer Neg Hx    Colon polyps Neg Hx    Social History   Socioeconomic History   Marital status: Widowed    Spouse name: Not on file   Number of children: Not on file   Years of education: Not on file   Highest education level: Not on file  Occupational History   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 agent    Occupation: retired   Tobacco Use   Smoking status: Never   Smokeless tobacco: Never  Building services engineer Use: Never used  Substance and Sexual Activity   Alcohol use: No   Drug use: No   Sexual activity: Not Currently  Other Topics Concern   Not on file  Social History Narrative   Long term resident of [facility]    Social Determinants of Health   Financial Resource Strain: Low Risk  (07/14/2018)   Overall Financial Resource Strain (CARDIA)    Difficulty of Paying Living Expenses: Not hard at all  Food Insecurity: No Food Insecurity (07/14/2018)   Hunger Vital Sign    Worried About Running Out of Food in the Last Year: Never true    Ran Out of Food in the Last Year: Never true  Transportation Needs: No Transportation Needs (07/14/2018)   PRAPARE - Administrator, Civil Service (Medical): No    Lack of  Transportation (Non-Medical): No  Physical Activity: Insufficiently Active (07/14/2018)   Exercise Vital Sign    Days of Exercise per Week: 4 days    Minutes of Exercise per Session: 20 min  Stress: Stress Concern Present (07/14/2018)   [employer] of Occupational Health - Occupational Stress Questionnaire    Feeling of Stress : To some extent  Social Connections: Moderately Isolated (07/14/2018)   Social Connection and Isolation Panel [NHANES]    Frequency of  Communication with Friends and Family: Twice a week    Frequency of Social Gatherings with Friends and Family: Once a week    Attends Religious Services: Never    Database administrator or Organizations: No    Attends Banker Meetings: Never    Marital Status: Widowed    Tobacco Counseling Counseling given: Not Answered   Clinical Intake:  Pre-visit preparation completed: Yes  Pain : No/denies pain     BMI - recorded: 34.24 Nutritional Status: BMI > 30  Obese Nutritional Risks: Unintentional weight gain Diabetes: No  How often do you need to have someone help you when you read instructions, pamphlets, or other written materials from your doctor or pharmacy?: 5 - Always  Diabetic?no  Interpreter Needed?: No  Comments: long term resident of [facility]   Activities of Daily Living    01/31/2023   12:09 PM  In your present state of health, do you have any difficulty performing the following activities:  Hearing? 0  Vision? 0  Difficulty concentrating or making decisions? 1  Walking or climbing stairs? 1  Dressing or bathing? 1  Doing errands, shopping? 1  Preparing Food and eating ? Y  Using the Toilet? Y  In the past six months, have you accidently leaked urine? Y  Do you have problems with loss of bowel control? Y  Managing your Medications? Y  Managing your Finances? Y  Housekeeping or managing your Housekeeping? Y    Patient Care Team: Sharee Holster, NP as PCP - General (Geriatric  Medicine) [place] Bali, MD (Inactive) (Gastroenterology) [facility] Lanelle Bal, DO as Consulting Physician (Internal Medicine)  Indicate any recent Medical Services you may have received from other than [facility] providers in the past year (date may be approximate).     Assessment:   This is a routine wellness examination for Amarra.  Hearing/Vision screen No results found.  Dietary issues and exercise activities discussed: Current Exercise Habits: The patient does not participate in regular exercise at present, Exercise limited by: None identified   Goals Addressed             This Visit's Progress    Absence of Fall and Fall-Related Injury   Not on track    Evidence-based guidance:  Assess fall risk using a validated tool when available. Consider balance and gait impairment, muscle weakness, diminished vision or hearing, environmental hazards, presence of urinary or bowel urgency and/or incontinence.  Communicate fall injury risk to interprofessional healthcare team.  Develop a fall prevention plan with the patient and family.  Promote use of personal vision and auditory aids.  Promote reorientation, appropriate sensory stimulation, and routines to decrease risk of fall when changes in mental status are present.  Assess assistance level required for safe and effective self-care; consider referral for home care.  Encourage physical activity, such as performance of self-care at highest level of ability, strength and balance exercise program, and provision of appropriate assistive devices; refer to rehabilitation therapy.  Refer to community-based fall prevention program where available.  If fall occurs, determine the cause and revise fall injury prevention plan.  Regularly review medication contribution to fall risk; consider risk related to polypharmacy and age.  Refer to pharmacist for consultation when concerns about medications are  revealed.  Balance adequate pain management with potential for oversedation.  Provide guidance related to environmental modifications.  Consider supplementation with Vitamin D.   Notes:  DIET - INCREASE WATER INTAKE   On track    General - Client will not be readmitted within 30 days ([facility])   On track      Depression Screen    01/31/2023   12:06 PM 12/24/2022    2:48 PM 10/02/2022   11:05 AM 01/29/2022   10:56 AM 12/18/2021    1:37 PM 10/17/2021   12:03 PM 01/24/2021   11:07 AM  PHQ 2/9 Scores  PHQ - 2 Score 0 0 0 0 0 0 0  PHQ- 9 Score 0  0 0 0  2    Fall Risk    01/31/2023   12:06 PM 12/24/2022    2:47 PM 10/02/2022   11:04 AM 01/29/2022   10:56 AM 12/18/2021    1:38 PM  Fall Risk   Falls in the past year? 1 0 1 1 1   Number falls in past yr: 1 0 1 1 1   Injury with Fall? 0 0 0 0 0  Risk for fall due to : History of fall(s);Impaired balance/gait No Fall Risks History of fall(s);Impaired balance/gait;Impaired mobility History of fall(s);Impaired balance/gait;Impaired mobility History of fall(s);Impaired balance/gait;Impaired mobility  Follow up  Falls evaluation completed       FALL RISK PREVENTION PERTAINING TO THE HOME:  Any stairs in or around the home? Yes  If so, are there any without handrails? No  Home free of loose throw rugs in walkways, pet beds, electrical cords, etc? yes Adequate lighting in your home to reduce risk of falls? Yes   ASSISTIVE DEVICES UTILIZED TO PREVENT FALLS:  Life alert? No  Use of a cane, walker or w/c? Yes  Grab bars in the bathroom? Yes  Shower chair or bench in shower? Yes  Elevated toilet seat or a handicapped toilet? Yes   TIMED UP AND GO:  Was the test performed? No .  nonambluatory  Cognitive Function:    01/29/2022   10:57 AM 01/24/2021   11:09 AM 04/29/2014   10:45 AM  MMSE - Mini Mental State Exam  Not completed: Refused Refused   Orientation to time   5  Orientation to Place   5  Registration   3  Attention/  Calculation   5  Recall   2  Language- name 2 objects   2  Language- repeat   1  Language- follow 3 step command   3  Language- read & follow direction   1  Write a sentence   1  Copy design   0  Total score   28        01/22/2020   11:52 AM 07/14/2018   11:03 AM 07/04/2017    8:45 AM  6CIT Screen  What Year? 0 points 0 points 0 points  What month? 0 points 0 points 0 points  What time? 0 points 0 points 0 points  Count back from 20 2 points 0 points 0 points  Months in reverse 2 points 0 points 0 points  Repeat phrase 2 points 4 points 4 points  Total Score 6 points 4 points 4 points    Immunizations Immunization History  Administered Date(s) Administered   Fluad Quad(high Dose 65+) 07/12/2021   Influenza Split 07/25/2012   Influenza,inj,Quad PF,6+ Mos 07/30/2013, 08/18/2014   Influenza-Unspecified 07/12/2017, 07/11/2018, 07/13/2019, 07/15/2020, 07/10/2022   Moderna Covid-19 Vaccine Bivalent Booster 55yrs & up 08/01/2021   Moderna SARS-COV2 Booster Vaccination 02/27/2022   Moderna Sars-Covid-2 Vaccination 10/14/2019, 11/11/2019, 08/11/2020, 01/18/2021, 08/01/2022   PNEUMOCOCCAL  CONJUGATE-20 03/23/2022   PPD Test 12/06/2014   Pneumococcal Conjugate-13 04/29/2014   Pneumococcal Polysaccharide-23 07/25/2012   Rsv, Bivalent, Protein Subunit Rsvpref,pf Verdis Frederickson) 09/10/2022   Tdap 08/08/2011, 06/20/2017   Zoster Recombinat (Shingrix) 06/06/2021, 09/08/2021    TDAP status: Up to date  Flu Vaccine status: Up to date  Pneumococcal vaccine status: Up to date  Covid-19 vaccine status: Completed vaccines  Qualifies for Shingles Vaccine? Yes   Zostavax completed No   Shingrix Completed?: No.    Education has been provided regarding the importance of this vaccine. Patient has been advised to call insurance company to determine out of pocket expense if they have not yet received this vaccine. Advised may also receive vaccine at local pharmacy or Health Dept. Verbalized  acceptance and understanding.  Screening Tests Health Maintenance  Topic Date Due   INFLUENZA VACCINE  05/09/2023   Medicare Annual Wellness (AWV)  01/02/2024   DTaP/Tdap/Td (3 - Td or Tdap) 06/21/2027   Pneumonia Vaccine 44+ Years old  Completed   DEXA SCAN  Completed   Zoster Vaccines- Shingrix  Completed   HPV VACCINES  Aged Out   COVID-19 Vaccine  Discontinued    Health Maintenance  There are no preventive care reminders to display for this patient.  Colorectal cancer screening: No longer required.   Mammogram status: No longer required due to age.  Bone Density status: Completed 05/2022. Results reflect: Bone density results: OSTEOPENIA. Repeat every 2 years.  Lung Cancer Screening: (Low Dose CT Chest recommended if Age 43-80 years, 30 pack-year currently smoking OR have quit w/in 15years.) does not qualify.   Lung Cancer Screening Referral:   Additional Screening:  Hepatitis C Screening: does not qualify; Completed   Vision Screening: Recommended annual ophthalmology exams for early detection of glaucoma and other disorders of the eye. Is the patient up to date with their annual eye exam?  No  Who is the provider or what is the name of the office in which the patient attends annual eye exams?  If pt is not established with a provider, would they like to be referred to a provider to establish care? No .   Dental Screening: Recommended annual dental exams for proper oral hygiene  Community Resource Referral / Chronic Care Management: CRR required this visit?  No   CCM required this visit?  No      Plan:     I have personally reviewed and noted the following in the patient's chart:   Medical and social history Use of alcohol, tobacco or illicit drugs  Current medications and supplements including opioid prescriptions. Patient is not currently taking opioid prescriptions. Functional ability and status Nutritional status Physical activity Advanced  directives List of other physicians Hospitalizations, surgeries, and ER visits in previous 12 months Vitals Screenings to include cognitive, depression, and falls Referrals and appointments  In addition, I have reviewed and discussed with patient certain preventive protocols, quality metrics, and best practice recommendations. A written personalized care plan for preventive services as well as general preventive health recommendations were provided to patient.     Sharee Holster, NP   01/31/2023   Nurse Notes: this exam was performed at this facility by myself

## 2023-01-31 NOTE — Progress Notes (Signed)
Location:  [facility] Room Number: 116D Place of Service:  SNF (31)   CODE STATUS: DNR  No Known Allergies  Chief Complaint  Patient presents with   Medicare Wellness    Patient is being seen for a Medicare annual wellness visit     HPI:    Past Medical History:  Diagnosis Date   Diverticulosis OCT 2010 TCS   SIGMOID COLON   Esophageal web 2010   DILATION 16 MM   Gastritis, Helicobacter pylori 2010   Rx. ABO for 10 days    GERD (gastroesophageal reflux disease)    Hip fracture, left    2016   Hyperlipidemia    Hypertension    Restless legs syndrome    Seizures January 2010   Post head trauma in fall .sees Dr Gerilyn Pilgrim    Past Surgical History:  Procedure Laterality Date   ABDOMINAL HYSTERECTOMY     COLONOSCOPY  OCT 2010   TORTUOUS, Sml IH, Tics, MULTIPLE SIMPLE ADENOMAS (<6MM)   HIP ARTHROPLASTY Left 11/19/2014   Procedure: LEFT PARTIAL HIP REPLACEMENT;  Surgeon: Vickki Hearing, MD;  Location: [facility];  Service: Orthopedics;  Laterality: Left;   ORIF HIP FRACTURE  04/05/2012   Procedure: OPEN REDUCTION INTERNAL FIXATION HIP;  Surgeon: Vickki Hearing, MD;  Location: [facility];  Service: Orthopedics;  Laterality: Right;   UPPER GASTROINTESTINAL ENDOSCOPY  AUG 2010   SAVARY   VESICOVAGINAL FISTULA CLOSURE W/ TAH  1966   bleeding     Social History   Socioeconomic History   Marital status: Widowed    Spouse name: Not on file   Number of children: Not on file   Years of education: Not on file   Highest education level: Not on file  Occupational History   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 agent    Occupation: retired   Tobacco Use   Smoking status: Never   Smokeless tobacco: Never  Building services engineer Use: Never used  Substance and Sexual Activity   Alcohol use: No   Drug use: No   Sexual activity: Not Currently  Other Topics Concern   Not on file  Social History Narrative   [facility] term resident of [facility]     Social Determinants of Health   Financial Resource Strain: Low Risk  (07/14/2018)   Overall Financial Resource Strain (CARDIA)    Difficulty of Paying Living Expenses: Not hard at all  Food Insecurity: No Food Insecurity (07/14/2018)   Hunger Vital Sign    Worried About Running Out of Food in the Last Year: Never true    Ran Out of Food in the Last Year: Never true  Transportation Needs: No Transportation Needs (07/14/2018)   PRAPARE - Administrator, Civil Service (Medical): No    Lack of Transportation (Non-Medical): No  Physical Activity: Insufficiently Active (07/14/2018)   Exercise Vital Sign    Days of Exercise per Week: 4 days    Minutes of Exercise per Session: 20 min  Stress: Stress Concern Present (07/14/2018)   [employer] of Occupational Health - Occupational Stress Questionnaire    Feeling of Stress : To some extent  Social Connections: Moderately Isolated (07/14/2018)   Social Connection and Isolation Panel [NHANES]    Frequency of Communication with Friends and Family: Twice a week    Frequency of Social Gatherings with Friends and Family: Once a week    Attends Religious Services: Never    Production manager of  Clubs or Organizations: No    Attends Banker Meetings: Never    Marital Status: Widowed  Intimate Partner Violence: Not At Risk (07/14/2018)   Humiliation, Afraid, Rape, and Kick questionnaire    Fear of Current or Ex-Partner: No    Emotionally Abused: No    Physically Abused: No    Sexually Abused: No   Family History  Problem Relation Age of Onset   Cancer Mother        bladder    Diabetes Mother    Diabetes Father    Emphysema Father    GER disease Brother    Colon cancer Neg Hx    Colon polyps Neg Hx       VITAL SIGNS BP 113/69   Pulse 63   Temp 97.6 F (36.4 C) (Temporal)   Resp 17   Ht  (1.702 m)   Wt 218 lb 9.6 oz (99.2 kg)   SpO2 98%   BMI 34.24 kg/m   Outpatient Encounter Medications as of  01/31/2023  Medication Sig   Acetaminophen (TYLENOL ARTHRITIS PAIN PO) Take 650 mg by mouth in the morning and at bedtime.   aspirin 81 MG chewable tablet Chew 81 mg by mouth daily.   bisacodyl (DULCOLAX) 10 MG suppository Place 10 mg rectally as needed.   Brexpiprazole (REXULTI) 0.5 MG TABS Take 0.5 mg by mouth daily at 2 PM.   Carbidopa-Levodopa ER (SINEMET CR) 25-100 MG tablet controlled release Take 1 tablet by mouth 3 (three) times daily.   FLUoxetine (PROZAC) 40 MG capsule Take 40 mg by mouth daily.   fluticasone-salmeterol (ADVAIR) 100-50 MCG/ACT AEPB Inhale 1 puff into the lungs 2 (two) times daily. Special Instructions: RINSE MOUTH AFTER USE. DISCARD AFTER 1 MONTH   levETIRAcetam (KEPPRA) 250 MG tablet Take 250 mg by mouth daily. Take 250 mg in the AM and 500 mg in the PM   Melatonin 5 MG TABS Take 5 mg by mouth at bedtime.   memantine (NAMENDA) 10 MG tablet Take 10 mg by mouth 2 (two) times daily.   NON FORMULARY Diet type: Regular   potassium chloride (KLOR-CON) 10 MEQ tablet Take 10 mEq by mouth daily.   sennosides-docusate sodium (SENOKOT-S) 8.6-50 MG tablet Take 1 tablet by mouth in the morning and at bedtime. For Constipation   zinc oxide 20 % ointment Apply 1 Application topically 3 (three) times daily. Apply to sacrum, coccyx and bilateral buttocks   No facility-administered encounter medications on file as of 01/31/2023.     SIGNIFICANT DIAGNOSTIC EXAMS       ASSESSMENT/ PLAN:     Synthia Innocent NP [facility]  Contact [phone] Monday through Friday 8am- 5pm  After hours call [phone]

## 2023-02-14 ENCOUNTER — Non-Acute Institutional Stay (SKILLED_NURSING_FACILITY): Payer: Medicare PPO | Admitting: Adult Health

## 2023-02-14 ENCOUNTER — Encounter: Payer: Self-pay | Admitting: Adult Health

## 2023-02-14 DIAGNOSIS — R443 Hallucinations, unspecified: Secondary | ICD-10-CM

## 2023-02-14 DIAGNOSIS — G40909 Epilepsy, unspecified, not intractable, without status epilepticus: Secondary | ICD-10-CM

## 2023-02-14 DIAGNOSIS — F03918 Unspecified dementia, unspecified severity, with other behavioral disturbance: Secondary | ICD-10-CM | POA: Diagnosis not present

## 2023-02-14 DIAGNOSIS — F22 Delusional disorders: Secondary | ICD-10-CM

## 2023-02-14 NOTE — Progress Notes (Signed)
Location:  [facility] Room Number: 7 D Place of Service:  SNF (31)   CODE STATUS: DNR  No Known Allergies  Chief Complaint  Patient presents with   Acute Visit    Fall    HPI:  She has had 12 falls since February of this year; with a majority of the falls since increasing her rexulti to 0.5 mg daily. She has completed therapy treatment. She has been having delusions about "trying to get to my children" and "going home". She has been given a doll to help with her emotional status. The care plan team feels as though she is not getting any benefit from namenda.   Past Medical History:  Diagnosis Date   Diverticulosis OCT 2010 TCS   SIGMOID COLON   Esophageal web 2010   DILATION 16 MM   Gastritis, Helicobacter pylori 2010   Rx. ABO for 10 days    GERD (gastroesophageal reflux disease)    Hip fracture, left (HCC)    2016   Hyperlipidemia    Hypertension    Restless legs syndrome    Seizures (HCC) January 2010   Post head trauma in fall .sees Dr Gerilyn Pilgrim    Past Surgical History:  Procedure Laterality Date   ABDOMINAL HYSTERECTOMY     COLONOSCOPY  OCT 2010   TORTUOUS, Sml IH, Tics, MULTIPLE SIMPLE ADENOMAS (<6MM)   HIP ARTHROPLASTY Left 11/19/2014   Procedure: LEFT PARTIAL HIP REPLACEMENT;  Surgeon: Vickki Hearing, MD;  Location: [facility];  Service: Orthopedics;  Laterality: Left;   ORIF HIP FRACTURE  04/05/2012   Procedure: OPEN REDUCTION INTERNAL FIXATION HIP;  Surgeon: Vickki Hearing, MD;  Location: [facility];  Service: Orthopedics;  Laterality: Right;   UPPER GASTROINTESTINAL ENDOSCOPY  AUG 2010   SAVARY   VESICOVAGINAL FISTULA CLOSURE W/ TAH  1966   bleeding     Social History   Socioeconomic History   Marital status: Widowed    Spouse name: Not on file   Number of children: Not on file   Years of education: Not on file   Highest education level: Not on file  Occupational History   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Chief Strategy Officer    Occupation: retired   Tobacco Use   Smoking status: Never   Smokeless tobacco: Never  Building services engineer Use: Never used  Substance and Sexual Activity   Alcohol use: No   Drug use: No   Sexual activity: Not Currently  Other Topics Concern   Not on file  Social History Narrative   Long term resident of [facility]    Social Determinants of Health   Financial Resource Strain: Low Risk  (07/14/2018)   Overall Financial Resource Strain (CARDIA)    Difficulty of Paying Living Expenses: Not hard at all  Food Insecurity: No Food Insecurity (07/14/2018)   Hunger Vital Sign    Worried About Running Out of Food in the Last Year: Never true    Ran Out of Food in the Last Year: Never true  Transportation Needs: No Transportation Needs (07/14/2018)   PRAPARE - Administrator, Civil Service (Medical): No    Lack of Transportation (Non-Medical): No  Physical Activity: Insufficiently Active (07/14/2018)   Exercise Vital Sign    Days of Exercise per Week: 4 days    Minutes of Exercise per Session: 20 min  Stress: Stress Concern Present (07/14/2018)   [employer] of Occupational Health - Occupational Stress Questionnaire  Feeling of Stress : To some extent  Social Connections: Moderately Isolated (07/14/2018)   Social Connection and Isolation Panel [NHANES]    Frequency of Communication with Friends and Family: Twice a week    Frequency of Social Gatherings with Friends and Family: Once a week    Attends Religious Services: Never    Database administrator or Organizations: No    Attends Banker Meetings: Never    Marital Status: Widowed  Intimate Partner Violence: Not At Risk (07/14/2018)   Humiliation, Afraid, Rape, and Kick questionnaire    Fear of Current or Ex-Partner: No    Emotionally Abused: No    Physically Abused: No    Sexually Abused: No   Family History  Problem Relation Age of Onset   Cancer Mother        bladder    Diabetes Mother     Diabetes Father    Emphysema Father    GER disease Brother    Colon cancer Neg Hx    Colon polyps Neg Hx       VITAL SIGNS BP 110/69   Pulse (!) 59   Temp (!) 97.4 F (36.3 C)   Wt 222 lb 3.2 oz (100.8 kg)   BMI 34.80 kg/m   Outpatient Encounter Medications as of 02/14/2023  Medication Sig   Acetaminophen (TYLENOL ARTHRITIS PAIN PO) Take 650 mg by mouth in the morning and at bedtime.   aspirin 81 MG chewable tablet Chew 81 mg by mouth daily.   bisacodyl (DULCOLAX) 10 MG suppository Place 10 mg rectally as needed.   Brexpiprazole (REXULTI) 0.5 MG TABS Take 0.5 mg by mouth daily at 2 PM.   Carbidopa-Levodopa ER (SINEMET CR) 25-100 MG tablet controlled release Take 1 tablet by mouth 3 (three) times daily.   FLUoxetine (PROZAC) 40 MG capsule Take 40 mg by mouth daily.   fluticasone-salmeterol (ADVAIR) 100-50 MCG/ACT AEPB Inhale 1 puff into the lungs 2 (two) times daily. Special Instructions: RINSE MOUTH AFTER USE. DISCARD AFTER 1 MONTH   levETIRAcetam (KEPPRA) 250 MG tablet Take 250 mg by mouth daily.   levETIRAcetam (KEPPRA) 500 MG tablet Take 500 mg by mouth at bedtime. 8 pm   Melatonin 5 MG TABS Take 5 mg by mouth at bedtime.   memantine (NAMENDA) 10 MG tablet Take 10 mg by mouth 2 (two) times daily.   NON FORMULARY Diet type: Regular   potassium chloride (KLOR-CON) 10 MEQ tablet Take 10 mEq by mouth daily.   sennosides-docusate sodium (SENOKOT-S) 8.6-50 MG tablet Take 1 tablet by mouth in the morning and at bedtime. For Constipation   zinc oxide 20 % ointment Apply 1 Application topically 3 (three) times daily. Apply to sacrum, coccyx and bilateral buttocks   No facility-administered encounter medications on file as of 02/14/2023.     SIGNIFICANT DIAGNOSTIC EXAMS  PREVIOUS  09-07-19: DEXA: t score -1.06  NO NEW EXAMS.   LABS REVIEWED PREVIOUS  10-04-22: wbc 4.6; hgb 12.0; hct 37.9; mcv 94.5 plt 157; glucose 75; bun 21; creat 0.70; k+ 3.6; na++ 143; ca 8.2 gfr >60;  protein 6.0; albumin 3.3 chol 137; ldl 86; trig 55; hdl 40 12-11-22: wbc 4.5; hgb 13.8; hct 43.3; mcv 93.5 plt 189; glucose 75 bun 18; creat 0.69; k+ 3.1; na++ 141; ca 9.1; protein 7.4 albumin 4.0; tsh 2.415; vitamin B 12: 412  keppra 8.1  12-17-22: glucose 77; bun 19; creat 0.69; k+ 3.2; na++ 139; ca 8.0; gfr >60 12-24-22: glucose 77;  bun 22; creat 0.75; k+ 3.7; na++ 140; ca 8.1; gfr >60  TODAY  12-27-22: wbc 4.1; hgb 11.9; hct 36.9; mcv 92.7 plt 148 keppra 12.9    Review of Systems  Constitutional:  Negative for malaise/fatigue.  Respiratory:  Negative for cough and shortness of breath.   Cardiovascular:  Negative for chest pain, palpitations and leg swelling.  Gastrointestinal:  Negative for abdominal pain, constipation and heartburn.  Musculoskeletal:  Negative for back pain, joint pain and myalgias.  Skin: Negative.   Neurological:  Negative for dizziness.  Psychiatric/Behavioral:  The patient is not nervous/anxious.    Physical Exam Constitutional:      General: She is not in acute distress.    Appearance: She is well-developed. She is obese. She is not diaphoretic.  Neck:     Thyroid: No thyromegaly.  Cardiovascular:     Rate and Rhythm: Normal rate and regular rhythm.     Pulses: Normal pulses.     Heart sounds: Normal heart sounds.  Pulmonary:     Effort: Pulmonary effort is normal. No respiratory distress.     Breath sounds: Normal breath sounds.  Abdominal:     General: Bowel sounds are normal. There is no distension.     Palpations: Abdomen is soft.     Tenderness: There is no abdominal tenderness.  Musculoskeletal:     Cervical back: Neck supple.     Right lower leg: No edema.     Left lower leg: No edema.     Comments: Able to move all extremities   Lymphadenopathy:     Cervical: No cervical adenopathy.  Skin:    General: Skin is warm and dry.  Neurological:     Mental Status: She is alert. Mental status is at baseline.  Psychiatric:        Mood and Affect:  Mood normal.      ASSESSMENT/ PLAN:  TODAY  Seizure disorder Dementia with behavioral disturbance Delusions/hallucinations   Will stop namenda Will reduce rexulti to 0.25 mg due to her falls Will monitor her status.    Synthia Innocent NP [facility]  call [phone]

## 2023-02-20 ENCOUNTER — Encounter: Payer: Self-pay | Admitting: Internal Medicine

## 2023-02-20 ENCOUNTER — Non-Acute Institutional Stay (SKILLED_NURSING_FACILITY): Payer: Medicare PPO | Admitting: Internal Medicine

## 2023-02-20 DIAGNOSIS — G40909 Epilepsy, unspecified, not intractable, without status epilepticus: Secondary | ICD-10-CM | POA: Diagnosis not present

## 2023-02-20 DIAGNOSIS — F03918 Unspecified dementia, unspecified severity, with other behavioral disturbance: Secondary | ICD-10-CM

## 2023-02-20 DIAGNOSIS — I10 Essential (primary) hypertension: Secondary | ICD-10-CM

## 2023-02-20 DIAGNOSIS — D649 Anemia, unspecified: Secondary | ICD-10-CM | POA: Diagnosis not present

## 2023-02-20 NOTE — Assessment & Plan Note (Addendum)
BP controlled w/o antihypertensive medications.  No change indicated.  

## 2023-02-20 NOTE — Assessment & Plan Note (Signed)
No seizure activity reported by staff.  Antiseizure medication serum level is therapeutic.  No change indicated.

## 2023-02-20 NOTE — Progress Notes (Signed)
NURSING HOME LOCATION:  [facility] ROOM NUMBER: [id number] D   CODE STATUS:  Full Code  PCP:  Synthia Innocent NP  This is a nursing facility follow up visit for specific acute issue of aborted fall. Also she is due for routine follow-up of chronic medical issues to document compliance with Regulation 483.30 (c) in The Long Term Care Survey Manual Phase 2 which mandates caregiver visit ( visits can alternate among physician, PA or NP as per statutes) within 10 days of 30 days / 60 days/ 90 days post admission to SNF date    Interim medical record and care since last SNF visit was updated with review of diagnostic studies and change in clinical status since last visit were documented.  HPI: Staff reports that while transferring from the shower chair to the bed she sat down on the floor.  Staff did not report any apparent injury. Family was notified. She is a permanent resident of this facility with a history of diverticulosis, GERD, dyslipidemia, essential hypertension, RLS, and history of seizures.  Most recent labs were performed in late March.  Calcium was low at 8.1.  There has been slight progression of anemia with H/H of 11.9/36.9, down from prior values of 13.8/43.3.  Levetiracetum level was therapeutic at 12.9.  Review of systems: Dementia invalidated responses.  She denies incident of being on the floor during a transfer.  She denies any active cardiac or neurologic symptoms.  She expressed no active symptoms. After I asked about possible cardiac prodrome, she asked "Where is my heart?"  Constitutional: No fever, significant weight change, fatigue  Eyes: No redness, discharge, pain, vision change ENT/mouth: No nasal congestion,  purulent discharge, earache, change in hearing, sore throat  Cardiovascular: No chest pain, palpitations, paroxysmal nocturnal dyspnea, edema  Respiratory: No cough, sputum production, hemoptysis, DOE, significant snoring, apnea    Gastrointestinal: No heartburn, dysphagia, abdominal pain, nausea /vomiting, rectal bleeding, melena, change in bowels Genitourinary: No dysuria, hematuria, pyuria, incontinence, nocturia Musculoskeletal: No joint stiffness, joint swelling, weakness, pain Dermatologic: No rash, pruritus, change in appearance of skin Neurologic: No dizziness, headache, syncope, seizures, numbness, tingling Psychiatric: No significant anxiety, depression, insomnia, anorexia Endocrine: No change in hair/skin/nails, excessive thirst, excessive hunger, excessive urination  Hematologic/lymphatic: No significant bruising, lymphadenopathy, abnormal bleeding Allergy/immunology: No itchy/watery eyes, significant sneezing, urticaria, angioedema  Physical exam:  Pertinent or positive findings: She was in the wheelchair in the hallway on the way to the activity room for an event that she had attended the hour previously.  She had difficulty understanding she needed to release the guard rail in the hall to allow me to take her back to her room for interview and exam.  This pattern of difficulty following commands was repetitive.  She exhibits a blank stare with mouth agape.  The left nasolabial fold is decreased.  She has alternating exotropia.  Arcus senilis is suggested.  Her speech is slurred and hyponasal.  She intermittently was yawning. She is edentulous. She has low-grade rales at the lower bases.  Abdomen is protuberant.  Pedal pulses are decreased.  She has nonpitting edema and is wearing TED hose.  She does move all extremities; strength could not be tested as she had difficulty following commands.  General appearance: Adequately nourished; no acute distress, increased work of breathing is present.   Lymphatic: No lymphadenopathy about the head, neck, axilla. Eyes: No conjunctival inflammation or lid edema is present. There is no scleral icterus. Ears:  External  ear exam shows no significant lesions or deformities.    Nose:  External nasal examination shows no deformity or inflammation. Nasal mucosa are pink and moist without lesions, exudates Oral exam:  Lips and gums are healthy appearing.  Neck:  No thyromegaly, masses, tenderness noted.    Heart:  Normal rate and regular rhythm. S1 and S2 normal without gallop, murmur, click, rub .  Lungs: Chest clear to auscultation without wheezes, rhonchi, rales, rubs. Abdomen: Bowel sounds are normal. Abdomen is soft and nontender with no organomegaly, hernias, masses. GU: Deferred  Extremities:  No cyanosis, clubbing  Neurologic exam :Balance, Rhomberg, finger to nose testing could not be completed due to clinical state Skin: Warm & dry w/o tenting. No significant lesions or rash.  See summary under each active problem in the Problem List with associated updated therapeutic plan

## 2023-02-20 NOTE — Patient Instructions (Signed)
See assessment and plan under each diagnosis in the problem list and acutely for this visit 

## 2023-02-20 NOTE — Assessment & Plan Note (Signed)
There has been slight progression of anemia with most recent H/H 11.9/36.9 down from prior values of 13.8/43.3.  No bleeding dyscrasias reported by staff.  Continue to monitor.

## 2023-02-20 NOTE — Assessment & Plan Note (Signed)
She had difficulty following commands.  She denied the event of the near fall when staff had to help her down to the floor.  She denies any new cardiac or neurologic symptoms. Her BIMS score was 7; but her dementia clinically has progressed significantly.

## 2023-03-27 ENCOUNTER — Encounter: Payer: Self-pay | Admitting: Adult Health

## 2023-03-27 ENCOUNTER — Non-Acute Institutional Stay (SKILLED_NURSING_FACILITY): Payer: Medicare PPO | Admitting: Adult Health

## 2023-03-27 DIAGNOSIS — G20A1 Parkinson's disease without dyskinesia, without mention of fluctuations: Secondary | ICD-10-CM

## 2023-03-27 DIAGNOSIS — G40909 Epilepsy, unspecified, not intractable, without status epilepticus: Secondary | ICD-10-CM | POA: Diagnosis not present

## 2023-03-27 DIAGNOSIS — G8929 Other chronic pain: Secondary | ICD-10-CM | POA: Diagnosis not present

## 2023-03-27 DIAGNOSIS — D696 Thrombocytopenia, unspecified: Secondary | ICD-10-CM

## 2023-03-27 NOTE — Progress Notes (Signed)
Location:  [facility] Room Number: 27 D Place of Service:  SNF (31)   CODE STATUS: DNR  No Known Allergies  Chief Complaint  Patient presents with   Medical Management of Chronic Issues           Parkinson's disease:   Chronic generalized pain: Seizure disorder  Thrombocytopenia     HPI:  She is a 89 year old long term resident of this facility being seen for the management of her chronic illnesses: Parkinson's disease:   Chronic generalized pain: Seizure disorder  Thrombocytopenia. She has not had any falls since 03-19-23. There are no report of uncontrolled pain. Her wight remains stable; is off namenda.   Past Medical History:  Diagnosis Date   Diverticulosis OCT 2010 TCS   SIGMOID COLON   Esophageal web 2010   DILATION 16 MM   Gastritis, Helicobacter pylori 2010   Rx. ABO for 10 days    GERD (gastroesophageal reflux disease)    Hip fracture, left (HCC)    2016   Hyperlipidemia    Hypertension    Restless legs syndrome    Seizures (HCC) January 2010   Post head trauma in fall .sees Dr Gerilyn Pilgrim    Past Surgical History:  Procedure Laterality Date   ABDOMINAL HYSTERECTOMY     COLONOSCOPY  OCT 2010   TORTUOUS, Sml IH, Tics, MULTIPLE SIMPLE ADENOMAS (<6MM)   HIP ARTHROPLASTY Left 11/19/2014   Procedure: LEFT PARTIAL HIP REPLACEMENT;  Surgeon: Vickki Hearing, MD;  Location: [facility];  Service: Orthopedics;  Laterality: Left;   ORIF HIP FRACTURE  04/05/2012   Procedure: OPEN REDUCTION INTERNAL FIXATION HIP;  Surgeon: Vickki Hearing, MD;  Location: [facility];  Service: Orthopedics;  Laterality: Right;   UPPER GASTROINTESTINAL ENDOSCOPY  AUG 2010   SAVARY   VESICOVAGINAL FISTULA CLOSURE W/ TAH  1966   bleeding     Social History   Socioeconomic History   Marital status: Widowed    Spouse name: Not on file   Number of children: Not on file   Years of education: Not on file   Highest education level: Not on file  Occupational History    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 agent    Occupation: retired   Tobacco Use   Smoking status: Never   Smokeless tobacco: Never  Building services engineer Use: Never used  Substance and Sexual Activity   Alcohol use: No   Drug use: No   Sexual activity: Not Currently  Other Topics Concern   Not on file  Social History Narrative   Long term resident of [facility]    Social Determinants of Health   Financial Resource Strain: Low Risk  (07/14/2018)   Overall Financial Resource Strain (CARDIA)    Difficulty of Paying Living Expenses: Not hard at all  Food Insecurity: No Food Insecurity (07/14/2018)   Hunger Vital Sign    Worried About Running Out of Food in the Last Year: Never true    Ran Out of Food in the Last Year: Never true  Transportation Needs: No Transportation Needs (07/14/2018)   PRAPARE - Administrator, Civil Service (Medical): No    Lack of Transportation (Non-Medical): No  Physical Activity: Insufficiently Active (07/14/2018)   Exercise Vital Sign    Days of Exercise per Week: 4 days    Minutes of Exercise per Session: 20 min  Stress: Stress Concern Present (07/14/2018)   [employer] of Occupational Health -  Occupational Stress Questionnaire    Feeling of Stress : To some extent  Social Connections: Moderately Isolated (07/14/2018)   Social Connection and Isolation Panel [NHANES]    Frequency of Communication with Friends and Family: Twice a week    Frequency of Social Gatherings with Friends and Family: Once a week    Attends Religious Services: Never    Database administrator or Organizations: No    Attends Banker Meetings: Never    Marital Status: Widowed  Intimate Partner Violence: Not At Risk (07/14/2018)   Humiliation, Afraid, Rape, and Kick questionnaire    Fear of Current or Ex-Partner: No    Emotionally Abused: No    Physically Abused: No    Sexually Abused: No   Family History  Problem Relation Age of Onset   Cancer  Mother        bladder    Diabetes Mother    Diabetes Father    Emphysema Father    GER disease Brother    Colon cancer Neg Hx    Colon polyps Neg Hx       VITAL SIGNS BP 106/67   Pulse (!) 56   Temp (!) 97 F (36.1 C)   Resp 20   Ht 5\' 7"  (1.702 m)   Wt 220 lb 3.2 oz (99.9 kg)   SpO2 95%   BMI 34.49 kg/m   Outpatient Encounter Medications as of 03/27/2023  Medication Sig   Acetaminophen (TYLENOL ARTHRITIS PAIN PO) Take 650 mg by mouth in the morning and at bedtime.   aspirin 81 MG chewable tablet Chew 81 mg by mouth daily.   Balsam [place]-Castor Oil (VENELEX) OINT Apply topically. Apply to sacrum, bilateral buttocks and coccyx qshift for MAD & excoriation.   bisacodyl (DULCOLAX) 10 MG suppository Place 10 mg rectally as needed.   brexpiprazole (REXULTI) 0.25 MG TABS tablet Take 0.25 mg by mouth daily at 2 PM.   Carbidopa-Levodopa ER (SINEMET CR) 25-100 MG tablet controlled release Take 1 tablet by mouth 3 (three) times daily.   FLUoxetine (PROZAC) 40 MG capsule Take 40 mg by mouth daily.   fluticasone-salmeterol (ADVAIR) 100-50 MCG/ACT AEPB Inhale 1 puff into the lungs 2 (two) times daily. Special Instructions: RINSE MOUTH AFTER USE. DISCARD AFTER 1 MONTH   levETIRAcetam (KEPPRA) 250 MG tablet Take 250 mg by mouth daily.   levETIRAcetam (KEPPRA) 500 MG tablet Take 500 mg by mouth at bedtime. 8 pm   Melatonin 5 MG TABS Take 5 mg by mouth at bedtime.   NON FORMULARY Diet type: Regular   potassium chloride (KLOR-CON) 10 MEQ tablet Take 10 mEq by mouth daily.   sennosides-docusate sodium (SENOKOT-S) 8.6-50 MG tablet Take 1 tablet by mouth in the morning and at bedtime. For Constipation   [DISCONTINUED] memantine (NAMENDA) 10 MG tablet Take 10 mg by mouth 2 (two) times daily.   [DISCONTINUED] zinc oxide 20 % ointment Apply 1 Application topically 3 (three) times daily. Apply to sacrum, coccyx and bilateral buttocks   No facility-administered encounter medications on file as of  03/27/2023.     SIGNIFICANT DIAGNOSTIC EXAMS  PREVIOUS  09-07-19: DEXA: t score -1.06  NO NEW EXAMS.   LABS REVIEWED PREVIOUS  10-04-22: wbc 4.6; hgb 12.0; hct 37.9; mcv 94.5 plt 157; glucose 75; bun 21; creat 0.70; k+ 3.6; na++ 143; ca 8.2 gfr >60; protein 6.0; albumin 3.3 chol 137; ldl 86; trig 55; hdl 40 12-11-22: wbc 4.5; hgb 13.8; hct 43.3; mcv 93.5 plt  189; glucose 75 bun 18; creat 0.69; k+ 3.1; na++ 141; ca 9.1; protein 7.4 albumin 4.0; tsh 2.415; vitamin B 12: 412  keppra 8.1  12-17-22: glucose 77; bun 19; creat 0.69; k+ 3.2; na++ 139; ca 8.0; gfr >60 12-24-22: glucose 77; bun 22; creat 0.75; k+ 3.7; na++ 140; ca 8.1; gfr >60 12-27-22: wbc 4.1; hgb 11.9; hct 36.9; mcv 92.7 plt 148 keppra 12.9   NO NEW LABS   Review of Systems  Constitutional:  Negative for malaise/fatigue.  Respiratory:  Negative for cough and shortness of breath.   Cardiovascular:  Negative for chest pain, palpitations and leg swelling.  Gastrointestinal:  Negative for abdominal pain, constipation and heartburn.  Musculoskeletal:  Negative for back pain, joint pain and myalgias.  Skin: Negative.   Neurological:  Negative for dizziness.  Psychiatric/Behavioral:  The patient is not nervous/anxious.    Physical Exam Constitutional:      General: She is not in acute distress.    Appearance: She is well-developed. She is obese. She is not diaphoretic.  Neck:     Thyroid: No thyromegaly.  Cardiovascular:     Rate and Rhythm: Regular rhythm. Bradycardia present.     Pulses: Normal pulses.     Heart sounds: Normal heart sounds.  Pulmonary:     Effort: Pulmonary effort is normal. No respiratory distress.     Breath sounds: Normal breath sounds.  Abdominal:     General: Bowel sounds are normal. There is no distension.     Palpations: Abdomen is soft.     Tenderness: There is no abdominal tenderness.  Musculoskeletal:     Cervical back: Neck supple.     Right lower leg: No edema.     Left lower leg: No  edema.     Comments: Able to move all extremities   Lymphadenopathy:     Cervical: No cervical adenopathy.  Skin:    General: Skin is warm and dry.  Neurological:     Mental Status: She is alert. Mental status is at baseline.  Psychiatric:        Mood and Affect: Mood normal.       ASSESSMENT/ PLAN:  TODAY:   Parkinson's disease: will continue sinemet cr 25/100 mg three times daily   2. Chronic generalized pain: will continue tylenol 650 mg twice daily   3. Seizure disorder (after CHI) no recent seizures reported; will continue keppra 250 mg in the AM and 500 mg in the PM.   4. Thrombocytopenia: plt 148    PREVIOUS     5. Essential hypertension: b/p 106/67 will continue to monitor   6. Dementia with agitation : weight is 220 pounds is off namenda as this medication was not providing any benefit. Is on rexulti 0.25 mg daily for agitation  7. Simple chronic bronchitis: without flares: will continue advair 100/50 1 puff twice daily   8. Chronic constipation: will continue senna s twice  daily   9. Hypokalemia:  k+ 3.7 is off supplement   10. Hallucination and delusion: her mood state is stable will continue rexulti 0.5 mg daily   11. Unspecified hyperlipidemia: LDL 89 is off statin due to advanced age   78. Unspecified obsessive compulsive disorder: will continue prozac 40 mg daily (has failed 2 weans)   13. Morbid obesity: BMI 34.49   Will check lipids    Synthia Innocent NP [facility]   call [phone]

## 2023-03-28 ENCOUNTER — Other Ambulatory Visit (HOSPITAL_COMMUNITY)
Admission: RE | Admit: 2023-03-28 | Discharge: 2023-03-28 | Disposition: A | Discharge status: Home / Self Care | Payer: Medicare PPO | Source: Skilled Nursing Facility | Attending: Adult Health | Admitting: Adult Health

## 2023-03-28 DIAGNOSIS — M1990 Unspecified osteoarthritis, unspecified site: Secondary | ICD-10-CM | POA: Diagnosis present

## 2023-03-28 DIAGNOSIS — E785 Hyperlipidemia, unspecified: Secondary | ICD-10-CM | POA: Insufficient documentation

## 2023-03-28 LAB — LIPID PANEL
Cholesterol: 175 mg/dL (ref 0–200)
HDL: 39 mg/dL — ABNORMAL LOW (ref >40)
LDL Cholesterol: 127 mg/dL — ABNORMAL HIGH (ref 0–99)
Total CHOL/HDL Ratio: 4.5 RATIO
Triglycerides: 44 mg/dL (ref <150)
VLDL: 9 mg/dL (ref 0–40)

## 2023-04-23 ENCOUNTER — Non-Acute Institutional Stay (SKILLED_NURSING_FACILITY): Payer: Medicare PPO | Admitting: Adult Health

## 2023-04-23 ENCOUNTER — Encounter: Payer: Self-pay | Admitting: Adult Health

## 2023-04-23 DIAGNOSIS — I10 Essential (primary) hypertension: Secondary | ICD-10-CM

## 2023-04-23 DIAGNOSIS — J410 Simple chronic bronchitis: Secondary | ICD-10-CM | POA: Diagnosis not present

## 2023-04-23 DIAGNOSIS — F01C11 Vascular dementia, severe, with agitation: Secondary | ICD-10-CM | POA: Diagnosis not present

## 2023-04-23 NOTE — Progress Notes (Signed)
Location:  [facility] Room Number: 116 Place of Service:  SNF (31)   CODE STATUS: full   No Known Allergies  Chief Complaint  Patient presents with   Medical Management of Chronic Issues              Essential hypertension: Dementia with agitation: Simple chronic bronchitis     HPI:  She is a 89 year old long term resident of this facility being seen for the management of her chronic illnesses: Essential hypertension: Dementia with agitation: Simple chronic bronchitis. There are no reports of uncontrolled pain. Her agitation is well managed with her current regimen. There are no reports of cough or shortness of breath.   Past Medical History:  Diagnosis Date   Diverticulosis OCT 2010 TCS   SIGMOID COLON   Esophageal web 2010   DILATION 16 MM   Gastritis, Helicobacter pylori 2010   Rx. ABO for 10 days    GERD (gastroesophageal reflux disease)    Hip fracture, left (HCC)    2016   Hyperlipidemia    Hypertension    Restless legs syndrome    Seizures (HCC) January 2010   Post head trauma in fall .sees Dr Gerilyn Pilgrim    Past Surgical History:  Procedure Laterality Date   ABDOMINAL HYSTERECTOMY     COLONOSCOPY  OCT 2010   TORTUOUS, Sml IH, Tics, MULTIPLE SIMPLE ADENOMAS (<6MM)   HIP ARTHROPLASTY Left 11/19/2014   Procedure: LEFT PARTIAL HIP REPLACEMENT;  Surgeon: Vickki Hearing, MD;  Location: [facility];  Service: Orthopedics;  Laterality: Left;   ORIF HIP FRACTURE  04/05/2012   Procedure: OPEN REDUCTION INTERNAL FIXATION HIP;  Surgeon: Vickki Hearing, MD;  Location: [facility];  Service: Orthopedics;  Laterality: Right;   UPPER GASTROINTESTINAL ENDOSCOPY  AUG 2010   SAVARY   VESICOVAGINAL FISTULA CLOSURE W/ TAH  1966   bleeding     Social History   Socioeconomic History   Marital status: Widowed    Spouse name: Not on file   Number of children: Not on file   Years of education: Not on file   Highest education level: Not on file   Occupational History   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 agent    Occupation: retired   Tobacco Use   Smoking status: Never   Smokeless tobacco: Never  Vaping Use   Vaping status: Never Used  Substance and Sexual Activity   Alcohol use: No   Drug use: No   Sexual activity: Not Currently  Other Topics Concern   Not on file  Social History Narrative   Long term resident of [facility]    Social Determinants of Health   Financial Resource Strain: Low Risk  (07/14/2018)   Overall Financial Resource Strain (CARDIA)    Difficulty of Paying Living Expenses: Not hard at all  Food Insecurity: No Food Insecurity (07/14/2018)   Hunger Vital Sign    Worried About Running Out of Food in the Last Year: Never true    Ran Out of Food in the Last Year: Never true  Transportation Needs: No Transportation Needs (07/14/2018)   PRAPARE - Administrator, Civil Service (Medical): No    Lack of Transportation (Non-Medical): No  Physical Activity: Insufficiently Active (07/14/2018)   Exercise Vital Sign    Days of Exercise per Week: 4 days    Minutes of Exercise per Session: 20 min  Stress: Stress Concern Present (07/14/2018)   [employer]  Occupational Health - Occupational Stress Questionnaire    Feeling of Stress : To some extent  Social Connections: Moderately Isolated (07/14/2018)   Social Connection and Isolation Panel [NHANES]    Frequency of Communication with Friends and Family: Twice a week    Frequency of Social Gatherings with Friends and Family: Once a week    Attends Religious Services: Never    Database administrator or Organizations: No    Attends Banker Meetings: Never    Marital Status: Widowed  Intimate Partner Violence: Not At Risk (07/14/2018)   Humiliation, Afraid, Rape, and Kick questionnaire    Fear of Current or Ex-Partner: No    Emotionally Abused: No    Physically Abused: No    Sexually Abused: No   Family History  Problem  Relation Age of Onset   Cancer Mother        bladder    Diabetes Mother    Diabetes Father    Emphysema Father    GER disease Brother    Colon cancer Neg Hx    Colon polyps Neg Hx       VITAL SIGNS BP 119/64   Pulse 61   Temp 98.4 F (36.9 C)   Resp 20   Ht 5\' 7"  (1.702 m)   Wt 216 lb 9.6 oz (98.2 kg)   SpO2 98%   BMI 33.92 kg/m   Outpatient Encounter Medications as of 04/23/2023  Medication Sig   Acetaminophen (TYLENOL ARTHRITIS PAIN PO) Take 650 mg by mouth in the morning and at bedtime.   aspirin 81 MG chewable tablet Chew 81 mg by mouth daily.   Balsam Peru-Castor Oil (VENELEX) OINT Apply topically. Apply to sacrum, bilateral buttocks and coccyx qshift for MAD & excoriation.   bisacodyl (DULCOLAX) 10 MG suppository Place 10 mg rectally as needed.   brexpiprazole (REXULTI) 0.25 MG TABS tablet Take 0.25 mg by mouth daily at 2 PM.   Carbidopa-Levodopa ER (SINEMET CR) 25-100 MG tablet controlled release Take 1 tablet by mouth 3 (three) times daily.   FLUoxetine (PROZAC) 40 MG capsule Take 40 mg by mouth daily.   fluticasone-salmeterol (ADVAIR) 100-50 MCG/ACT AEPB Inhale 1 puff into the lungs 2 (two) times daily. Special Instructions: RINSE MOUTH AFTER USE. DISCARD AFTER 1 MONTH   levETIRAcetam (KEPPRA) 250 MG tablet Take 250 mg by mouth daily.   levETIRAcetam (KEPPRA) 500 MG tablet Take 500 mg by mouth at bedtime. 8 pm   Melatonin 5 MG TABS Take 5 mg by mouth at bedtime.   NON FORMULARY Diet type: Regular   potassium chloride (KLOR-CON) 10 MEQ tablet Take 10 mEq by mouth daily.   sennosides-docusate sodium (SENOKOT-S) 8.6-50 MG tablet Take 1 tablet by mouth in the morning and at bedtime. For Constipation   No facility-administered encounter medications on file as of 04/23/2023.     SIGNIFICANT DIAGNOSTIC EXAMS   PREVIOUS  09-07-19: DEXA: t score -1.06  NO NEW EXAMS.   LABS REVIEWED PREVIOUS  10-04-22: wbc 4.6; hgb 12.0; hct 37.9; mcv 94.5 plt 157; glucose 75;  bun 21; creat 0.70; k+ 3.6; na++ 143; ca 8.2 gfr >60; protein 6.0; albumin 3.3 chol 137; ldl 86; trig 55; hdl 40 12-11-22: wbc 4.5; hgb 13.8; hct 43.3; mcv 93.5 plt 189; glucose 75 bun 18; creat 0.69; k+ 3.1; na++ 141; ca 9.1; protein 7.4 albumin 4.0; tsh 2.415; vitamin B 12: 412  keppra 8.1  12-17-22: glucose 77; bun 19; creat 0.69; k+ 3.2; na++ 139;  ca 8.0; gfr >60 12-24-22: glucose 77; bun 22; creat 0.75; k+ 3.7; na++ 140; ca 8.1; gfr >60 12-27-22: wbc 4.1; hgb 11.9; hct 36.9; mcv 92.7 plt 148 keppra 12.9   TODAY  03-28-23: chol 175; ldl 127; trig 44; hdl 39    Review of Systems  Constitutional:  Negative for malaise/fatigue.  Respiratory:  Negative for cough and shortness of breath.   Cardiovascular:  Negative for chest pain, palpitations and leg swelling.  Gastrointestinal:  Negative for abdominal pain, constipation and heartburn.  Musculoskeletal:  Negative for back pain, joint pain and myalgias.  Skin: Negative.   Neurological:  Negative for dizziness.  Psychiatric/Behavioral:  The patient is not nervous/anxious.    Physical Exam Constitutional:      General: She is not in acute distress.    Appearance: She is well-developed. She is obese. She is not diaphoretic.  Neck:     Thyroid: No thyromegaly.  Cardiovascular:     Rate and Rhythm: Normal rate and regular rhythm.     Pulses: Normal pulses.     Heart sounds: Normal heart sounds.  Pulmonary:     Effort: Pulmonary effort is normal. No respiratory distress.     Breath sounds: Normal breath sounds.  Abdominal:     General: Bowel sounds are normal. There is no distension.     Palpations: Abdomen is soft.     Tenderness: There is no abdominal tenderness.  Musculoskeletal:     Cervical back: Neck supple.     Right lower leg: No edema.     Left lower leg: No edema.     Comments: Able to move all extremities   Lymphadenopathy:     Cervical: No cervical adenopathy.  Skin:    General: Skin is warm and dry.  Neurological:      Mental Status: She is alert. Mental status is at baseline.  Psychiatric:        Mood and Affect: Mood normal.           ASSESSMENT/ PLAN:  TODAY:   Essential hypertension: b/p 119/64 will continue to monitor   2. Dementia with agitation: weight is 216 pounds; is off namenda (not providing benefit) is on rexulti 0.25 mg daily for agitation   3. Simple chronic bronchitis: without flares: will continue advair 100/50 1 puff twice daily   PREVIOUS     4. Chronic constipation: will continue senna s twice  daily   5. Hypokalemia:  k+ 3.7 is off supplement   6. Hallucination and delusion/agitation due to dementia: her mood state is stable will continue rexulti 0.25 mg daily   7. Unspecified hyperlipidemia: LDL 127 is off statin due to advanced age   52. Unspecified obsessive compulsive disorder: will continue prozac 40 mg daily (has failed 2 weans)   9. Morbid obesity: BMI 33.92  10. Parkinson's disease: will continue sinemet cr 25/100 mg three times daily   11. Chronic generalized pain: will continue tylenol 650 mg twice daily   12. Seizure disorder (after CHI) no recent seizures reported; will continue keppra 250 mg in the AM and 500 mg in the PM.   13. Thrombocytopenia: plt 148       Synthia Innocent NP [facility]   call [phone]

## 2023-04-29 ENCOUNTER — Non-Acute Institutional Stay (SKILLED_NURSING_FACILITY): Payer: Medicare PPO | Admitting: Adult Health

## 2023-04-29 ENCOUNTER — Encounter: Payer: Self-pay | Admitting: Adult Health

## 2023-04-29 DIAGNOSIS — F01C11 Vascular dementia, severe, with agitation: Secondary | ICD-10-CM

## 2023-04-29 DIAGNOSIS — G20A1 Parkinson's disease without dyskinesia, without mention of fluctuations: Secondary | ICD-10-CM

## 2023-04-29 NOTE — Progress Notes (Signed)
Location:  [facility] Room Number: 116 Place of Service:  SNF (31)   CODE STATUS: full   No Known Allergies  Chief Complaint  Patient presents with   Acute Visit    Decline in status     HPI:  Staff has expressed concerns about her overall status. There has been decline in her functional abilities. She does require max assist with her adls and is incontinent of bladder and bowel. She is able to propel herself in her wheelchair. She has lost about 10 pounds over the past 6 months. Her appetite remains fair.   Past Medical History:  Diagnosis Date   Diverticulosis OCT 2010 TCS   SIGMOID COLON   Esophageal web 2010   DILATION 16 MM   Gastritis, Helicobacter pylori 2010   Rx. ABO for 10 days    GERD (gastroesophageal reflux disease)    Hip fracture, left (HCC)    2016   Hyperlipidemia    Hypertension    Restless legs syndrome    Seizures (HCC) January 2010   Post head trauma in fall .sees Dr Gerilyn Pilgrim    Past Surgical History:  Procedure Laterality Date   ABDOMINAL HYSTERECTOMY     COLONOSCOPY  OCT 2010   TORTUOUS, Sml IH, Tics, MULTIPLE SIMPLE ADENOMAS (<6MM)   HIP ARTHROPLASTY Left 11/19/2014   Procedure: LEFT PARTIAL HIP REPLACEMENT;  Surgeon: Vickki Hearing, MD;  Location: [facility];  Service: Orthopedics;  Laterality: Left;   ORIF HIP FRACTURE  04/05/2012   Procedure: OPEN REDUCTION INTERNAL FIXATION HIP;  Surgeon: Vickki Hearing, MD;  Location: [facility];  Service: Orthopedics;  Laterality: Right;   UPPER GASTROINTESTINAL ENDOSCOPY  AUG 2010   SAVARY   VESICOVAGINAL FISTULA CLOSURE W/ TAH  1966   bleeding     Social History   Socioeconomic History   Marital status: Widowed    Spouse name: Not on file   Number of children: Not on file   Years of education: Not on file   Highest education level: Not on file  Occupational History   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 agent    Occupation: retired   Tobacco Use    Smoking status: Never   Smokeless tobacco: Never  Vaping Use   Vaping status: Never Used  Substance and Sexual Activity   Alcohol use: No   Drug use: No   Sexual activity: Not Currently  Other Topics Concern   Not on file  Social History Narrative   Long term resident of [facility]    Social Determinants of Health   Financial Resource Strain: Low Risk  (07/14/2018)   Overall Financial Resource Strain (CARDIA)    Difficulty of Paying Living Expenses: Not hard at all  Food Insecurity: No Food Insecurity (07/14/2018)   Hunger Vital Sign    Worried About Running Out of Food in the Last Year: Never true    Ran Out of Food in the Last Year: Never true  Transportation Needs: No Transportation Needs (07/14/2018)   PRAPARE - Administrator, Civil Service (Medical): No    Lack of Transportation (Non-Medical): No  Physical Activity: Insufficiently Active (07/14/2018)   Exercise Vital Sign    Days of Exercise per Week: 4 days    Minutes of Exercise per Session: 20 min  Stress: Stress Concern Present (07/14/2018)   [employer] of Occupational Health - Occupational Stress Questionnaire    Feeling of Stress : To some extent  Social  Connections: Moderately Isolated (07/14/2018)   Social Connection and Isolation Panel [NHANES]    Frequency of Communication with Friends and Family: Twice a week    Frequency of Social Gatherings with Friends and Family: Once a week    Attends Religious Services: Never    Database administrator or Organizations: No    Attends Banker Meetings: Never    Marital Status: Widowed  Intimate Partner Violence: Not At Risk (07/14/2018)   Humiliation, Afraid, Rape, and Kick questionnaire    Fear of Current or Ex-Partner: No    Emotionally Abused: No    Physically Abused: No    Sexually Abused: No   Family History  Problem Relation Age of Onset   Cancer Mother        bladder    Diabetes Mother    Diabetes Father    Emphysema Father    GER  disease Brother    Colon cancer Neg Hx    Colon polyps Neg Hx       VITAL SIGNS BP 125/60   Pulse 62   Temp (!) 97.4 F (36.3 C)   Resp 20   Ht 5\' 7"  (1.702 m)   Wt 216 lb 9.6 oz (98.2 kg)   SpO2 95%   BMI 33.92 kg/m   Outpatient Encounter Medications as of 04/29/2023  Medication Sig   Acetaminophen (TYLENOL ARTHRITIS PAIN PO) Take 650 mg by mouth in the morning and at bedtime.   aspirin 81 MG chewable tablet Chew 81 mg by mouth daily.   Balsam [place]-Castor Oil (VENELEX) OINT Apply topically. Apply to sacrum, bilateral buttocks and coccyx qshift for MAD & excoriation.   bisacodyl (DULCOLAX) 10 MG suppository Place 10 mg rectally as needed.   brexpiprazole (REXULTI) 0.25 MG TABS tablet Take 0.25 mg by mouth daily at 2 PM.   Carbidopa-Levodopa ER (SINEMET CR) 25-100 MG tablet controlled release Take 1 tablet by mouth 3 (three) times daily.   FLUoxetine (PROZAC) 40 MG capsule Take 40 mg by mouth daily.   fluticasone-salmeterol (ADVAIR) 100-50 MCG/ACT AEPB Inhale 1 puff into the lungs 2 (two) times daily. Special Instructions: RINSE MOUTH AFTER USE. DISCARD AFTER 1 MONTH   levETIRAcetam (KEPPRA) 250 MG tablet Take 250 mg by mouth daily.   levETIRAcetam (KEPPRA) 500 MG tablet Take 500 mg by mouth at bedtime. 8 pm   Melatonin 5 MG TABS Take 5 mg by mouth at bedtime.   NON FORMULARY Diet type: Regular   potassium chloride (KLOR-CON) 10 MEQ tablet Take 10 mEq by mouth daily.   sennosides-docusate sodium (SENOKOT-S) 8.6-50 MG tablet Take 1 tablet by mouth in the morning and at bedtime. For Constipation   No facility-administered encounter medications on file as of 04/29/2023.     SIGNIFICANT DIAGNOSTIC EXAMS  PREVIOUS  09-07-19: DEXA: t score -1.06  NO NEW EXAMS.   LABS REVIEWED PREVIOUS  10-04-22: wbc 4.6; hgb 12.0; hct 37.9; mcv 94.5 plt 157; glucose 75; bun 21; creat 0.70; k+ 3.6; na++ 143; ca 8.2 gfr >60; protein 6.0; albumin 3.3 chol 137; ldl 86; trig 55; hdl 40 12-11-22:  wbc 4.5; hgb 13.8; hct 43.3; mcv 93.5 plt 189; glucose 75 bun 18; creat 0.69; k+ 3.1; na++ 141; ca 9.1; protein 7.4 albumin 4.0; tsh 2.415; vitamin B 12: 412  keppra 8.1  12-17-22: glucose 77; bun 19; creat 0.69; k+ 3.2; na++ 139; ca 8.0; gfr >60 12-24-22: glucose 77; bun 22; creat 0.75; k+ 3.7; na++ 140; ca 8.1; gfr >  60 12-27-22: wbc 4.1; hgb 11.9; hct 36.9; mcv 92.7 plt 148 keppra 12.9  03-28-23: chol 175; ldl 127; trig 44; hdl 39  NO NEW LABS.     Review of Systems  Constitutional:  Negative for malaise/fatigue.  Respiratory:  Negative for cough and shortness of breath.   Cardiovascular:  Negative for chest pain, palpitations and leg swelling.  Gastrointestinal:  Negative for abdominal pain, constipation and heartburn.  Musculoskeletal:  Negative for back pain, joint pain and myalgias.  Skin: Negative.   Neurological:  Negative for dizziness.  Psychiatric/Behavioral:  The patient is not nervous/anxious.    Physical Exam Constitutional:      General: She is not in acute distress.    Appearance: She is well-developed. She is obese. She is not diaphoretic.  Neck:     Thyroid: No thyromegaly.  Cardiovascular:     Rate and Rhythm: Normal rate and regular rhythm.     Pulses: Normal pulses.     Heart sounds: Normal heart sounds.  Pulmonary:     Effort: Pulmonary effort is normal. No respiratory distress.     Breath sounds: Normal breath sounds.  Abdominal:     General: Bowel sounds are normal. There is no distension.     Palpations: Abdomen is soft.     Tenderness: There is no abdominal tenderness.  Musculoskeletal:     Cervical back: Neck supple.     Right lower leg: No edema.     Left lower leg: No edema.     Comments: Able to move all extremities   Lymphadenopathy:     Cervical: No cervical adenopathy.  Skin:    General: Skin is warm and dry.  Neurological:     Mental Status: She is alert. Mental status is at baseline.     Comments: 12-19-22: SLUMS 1/30  Psychiatric:         Mood and Affect: Mood normal.        ASSESSMENT/ PLAN:  TODAY  Parkinson's disease without dyskinesia unspecified whether manifestations fluctuate Severe vascular dementia with agitation.   At this time her decline is global in nature; we will continue to monitor her status and will provide further interventions as indicated at that time.   Synthia Innocent NP [facility]  call [phone]

## 2023-05-15 ENCOUNTER — Non-Acute Institutional Stay: Payer: Self-pay | Admitting: Internal Medicine

## 2023-05-15 ENCOUNTER — Encounter: Payer: Self-pay | Admitting: Internal Medicine

## 2023-05-15 DIAGNOSIS — G40909 Epilepsy, unspecified, not intractable, without status epilepticus: Secondary | ICD-10-CM

## 2023-05-15 DIAGNOSIS — E785 Hyperlipidemia, unspecified: Secondary | ICD-10-CM

## 2023-05-15 DIAGNOSIS — F03918 Unspecified dementia, unspecified severity, with other behavioral disturbance: Secondary | ICD-10-CM | POA: Diagnosis not present

## 2023-05-15 DIAGNOSIS — I10 Essential (primary) hypertension: Secondary | ICD-10-CM

## 2023-05-15 NOTE — Assessment & Plan Note (Addendum)
LDL has risen from 86 in December 2023 up to a value of 127 on 03/28/2023.  There is no history of CAD , CVA or diabetes and blood pressure is well-controlled without medication.  At 6 10-year life expectancy is not expected.  Risk versus benefit of statin outweighs initiating such.  Low-dose aspirin prophylaxis is not unreasonable.

## 2023-05-15 NOTE — Assessment & Plan Note (Signed)
No seizures reported; Keppra blood levels are therapeutic.  No change indicated.

## 2023-05-15 NOTE — Patient Instructions (Signed)
See assessment and plan under each diagnosis in the problem list and acutely for this visit 

## 2023-05-15 NOTE — Assessment & Plan Note (Signed)
Her speech pattern makes content almost undiscernible.  She had great difficulty understanding the reason for my visit today and had difficulty following commands.  There was no behavioral disturbance or agitation noted.

## 2023-05-15 NOTE — Progress Notes (Unsigned)
NURSING HOME LOCATION:  [facility] ROOM NUMBER:  [id number]  CODE STATUS: Full Code    PCP:  Synthia Innocent NP  This is a nursing facility follow up visit of chronic medical diagnoses & to document compliance with Regulation 483.30 (c) in The Long Term Care Survey Manual Phase 2 which mandates caregiver visit ( visits can alternate among physician, PA or NP as per statutes) within 10 days of 30 days / 60 days/ 90 days post admission to SNF date    Interim medical record and care since last SNF visit was updated with review of diagnostic studies and change in clinical status since last visit were documented.  HPI: She is a permanent resident of facility with medical diagnoses history of diverticulosis, history of Helicobacter pylori gastritis, GERD, dyslipidemia, essential hypertension, and history of seizures. LDL has risen from a value of 86 in December 2023 up to a value of 127 on 03/28/2023.  Review of systems: Dementia invalidated responses.  She appeared to deny any active symptoms but her speech is still garbled the content is essentially undiscernible.  She had difficulty following commands.  She seemed to have difficulty comprehending why I was interviewing and examining her today.  Constitutional: No fever, significant weight change, fatigue  Eyes: No redness, discharge, pain, vision change ENT/mouth: No nasal congestion,  purulent discharge, earache, change in hearing, sore throat  Cardiovascular: No chest pain, palpitations, paroxysmal nocturnal dyspnea, claudication, edema  Respiratory: No cough, sputum production, hemoptysis, DOE, significant snoring, apnea   Gastrointestinal: No heartburn, dysphagia, abdominal pain, nausea /vomiting, rectal bleeding, melena, change in bowels Genitourinary: No dysuria, hematuria, pyuria, incontinence, nocturia Musculoskeletal: No joint stiffness, joint swelling, weakness, pain Dermatologic: No rash, pruritus, change in appearance  of skin Neurologic: No dizziness, headache, syncope, seizures, numbness, tingling Psychiatric: No significant anxiety, depression, insomnia, anorexia Endocrine: No change in hair/skin/nails, excessive thirst, excessive hunger, excessive urination  Hematologic/lymphatic: No significant bruising, lymphadenopathy, abnormal bleeding Allergy/immunology: No itchy/watery eyes, significant sneezing, urticaria, angioedema  Physical exam:  Pertinent or positive findings: Facies tend to be blank in the mouth or gait.  She exhibits intermittent lipsmacking and dramatic chewing motion with the mandible.  There is intermittently marked exotropia of the right eye.  She is edentulous.  There is slight increase in the second heart sound.  Grade 1/2 systolic murmur is suggested.  Abdomen is protuberant.  Pedal pulses not palpable.  There is no edema but she is wearing compression hose.  General appearance: Adequately nourished; no acute distress, increased work of breathing is present.   Lymphatic: No lymphadenopathy about the head, neck, axilla. Eyes: No conjunctival inflammation or lid edema is present. There is no scleral icterus. Ears:  External ear exam shows no significant lesions or deformities.   Nose:  External nasal examination shows no deformity or inflammation. Nasal mucosa are pink and moist without lesions, exudates Oral exam:  Lips and gums are healthy appearing. There is no oropharyngeal erythema or exudate. Neck:  No thyromegaly, masses, tenderness noted.    Heart:  Normal rate and regular rhythm. S1 and S2 normal without gallop, murmur, click, rub .  Lungs: Chest clear to auscultation without wheezes, rhonchi, rales, rubs. Abdomen: Bowel sounds are normal. Abdomen is soft and nontender with no organomegaly, hernias, masses. GU: Deferred  Extremities:  No cyanosis, clubbing, edema  Neurologic exam : Cn 2-7 intact Strength equal  in upper & lower extremities Balance, Rhomberg, finger to nose  testing could not be completed  due to clinical state Deep tendon reflexes are equal Skin: Warm & dry w/o tenting. No significant lesions or rash.  See summary under each active problem in the Problem List with associated updated therapeutic plan

## 2023-05-15 NOTE — Assessment & Plan Note (Signed)
BP controlled w/o antihypertensive medications.  In fact at times blood pressure readings are soft.  No change indicated; continue to monitor.

## 2023-06-12 ENCOUNTER — Encounter: Payer: Self-pay | Admitting: Adult Health

## 2023-06-12 ENCOUNTER — Non-Acute Institutional Stay (SKILLED_NURSING_FACILITY): Payer: Medicare PPO | Admitting: Adult Health

## 2023-06-12 DIAGNOSIS — K5909 Other constipation: Secondary | ICD-10-CM

## 2023-06-12 DIAGNOSIS — I10 Essential (primary) hypertension: Secondary | ICD-10-CM

## 2023-06-12 DIAGNOSIS — Z66 Do not resuscitate: Secondary | ICD-10-CM | POA: Diagnosis not present

## 2023-06-12 DIAGNOSIS — E876 Hypokalemia: Secondary | ICD-10-CM

## 2023-06-12 NOTE — Progress Notes (Signed)
Location:  [facility] Room Number: 45 D Place of Service:  SNF (31)   CODE STATUS: DNR  No Known Allergies  Chief Complaint  Patient presents with   Medical Management of Chronic Issues              Essential hypertension:     Chronic constipation:    Hypokalemia:     HPI:  She is a 89 year old long term resident of this facility being seen for the management of her chronic illnesses: Essential hypertension:     Chronic constipation:    Hypokalemia.  She has had 4 falls over the past month without injury. She has had soft blood pressure readings. There have been no reports of syncopal episodes. There is a concern that the soft blood pressure readings could be related to her falls. We are unable to obtain orthostatic vitals as she is unable to stand.   Past Medical History:  Diagnosis Date   Diverticulosis OCT 2010 TCS   SIGMOID COLON   Esophageal web 10/08/2008   DILATION 16 MM   Gastritis, Helicobacter pylori 10/08/2008   Rx. ABO for 10 days    GERD (gastroesophageal reflux disease)    Hip fracture, left (HCC)    2016   Hyperlipidemia    Hypertension    Restless legs syndrome     Past Surgical History:  Procedure Laterality Date   ABDOMINAL HYSTERECTOMY     COLONOSCOPY  OCT 2010   TORTUOUS, Sml IH, Tics, MULTIPLE SIMPLE ADENOMAS (<6MM)   HIP ARTHROPLASTY Left 11/19/2014   Procedure: LEFT PARTIAL HIP REPLACEMENT;  Surgeon: Vickki Hearing, MD;  Location: [facility];  Service: Orthopedics;  Laterality: Left;   ORIF HIP FRACTURE  04/05/2012   Procedure: OPEN REDUCTION INTERNAL FIXATION HIP;  Surgeon: Vickki Hearing, MD;  Location: [facility];  Service: Orthopedics;  Laterality: Right;   UPPER GASTROINTESTINAL ENDOSCOPY  AUG 2010   SAVARY   VESICOVAGINAL FISTULA CLOSURE W/ TAH  1966   bleeding     Social History   Socioeconomic History   Marital status: Widowed    Spouse name: Not on file   Number of children: Not on file   Years of  education: Not on file   Highest education level: Not on file  Occupational History   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 agent    Occupation: retired   Tobacco Use   Smoking status: Never   Smokeless tobacco: Never  Vaping Use   Vaping status: Never Used  Substance and Sexual Activity   Alcohol use: No   Drug use: No   Sexual activity: Not Currently  Other Topics Concern   Not on file  Social History Narrative   Long term resident of [facility]    Social Determinants of Health   Financial Resource Strain: Low Risk  (07/14/2018)   Overall Financial Resource Strain (CARDIA)    Difficulty of Paying Living Expenses: Not hard at all  Food Insecurity: No Food Insecurity (07/14/2018)   Hunger Vital Sign    Worried About Running Out of Food in the Last Year: Never true    Ran Out of Food in the Last Year: Never true  Transportation Needs: No Transportation Needs (07/14/2018)   PRAPARE - Administrator, Civil Service (Medical): No    Lack of Transportation (Non-Medical): No  Physical Activity: Insufficiently Active (07/14/2018)   Exercise Vital Sign    Days of Exercise per Week: 4 days  Minutes of Exercise per Session: 20 min  Stress: Stress Concern Present (07/14/2018)   [employer] of Occupational Health - Occupational Stress Questionnaire    Feeling of Stress : To some extent  Social Connections: Moderately Isolated (07/14/2018)   Social Connection and Isolation Panel [NHANES]    Frequency of Communication with Friends and Family: Twice a week    Frequency of Social Gatherings with Friends and Family: Once a week    Attends Religious Services: Never    Database administrator or Organizations: No    Attends Banker Meetings: Never    Marital Status: Widowed  Intimate Partner Violence: Not At Risk (07/14/2018)   Humiliation, Afraid, Rape, and Kick questionnaire    Fear of Current or Ex-Partner: No    Emotionally Abused: No    Physically  Abused: No    Sexually Abused: No   Family History  Problem Relation Age of Onset   Cancer Mother        bladder    Diabetes Mother    Diabetes Father    Emphysema Father    GER disease Brother    Colon cancer Neg Hx    Colon polyps Neg Hx       VITAL SIGNS BP (!) 111/49   Pulse 79   Ht 5\' 7"  (1.702 m)   Wt 210 lb (95.3 kg)   SpO2 98%   BMI 32.89 kg/m   Outpatient Encounter Medications as of 06/12/2023  Medication Sig   Acetaminophen (TYLENOL ARTHRITIS PAIN PO) Take 650 mg by mouth in the morning and at bedtime.   aspirin 81 MG chewable tablet Chew 81 mg by mouth daily.   Balsam Peru-Castor Oil (VENELEX) OINT Apply topically. Apply to sacrum, bilateral buttocks and coccyx qshift for MAD & excoriation.   bisacodyl (DULCOLAX) 10 MG suppository Place 10 mg rectally as needed.   brexpiprazole (REXULTI) 0.25 MG TABS tablet Take 0.25 mg by mouth daily at 2 PM.   Carbidopa-Levodopa ER (SINEMET CR) 25-100 MG tablet controlled release Take 1 tablet by mouth 3 (three) times daily.   FLUoxetine (PROZAC) 40 MG capsule Take 40 mg by mouth daily.   fluticasone-salmeterol (ADVAIR) 100-50 MCG/ACT AEPB Inhale 1 puff into the lungs 2 (two) times daily. Special Instructions: RINSE MOUTH AFTER USE. DISCARD AFTER 1 MONTH   levETIRAcetam (KEPPRA) 250 MG tablet Take 250 mg by mouth daily.   levETIRAcetam (KEPPRA) 500 MG tablet Take 500 mg by mouth at bedtime. 8 pm   Melatonin 5 MG TABS Take 5 mg by mouth at bedtime.   NON FORMULARY Diet type: Regular   potassium chloride (KLOR-CON) 10 MEQ tablet Take 10 mEq by mouth daily.   Salicylic Acid 40 % MISC Apply 1 patch topically at bedtime. 8 pm   sennosides-docusate sodium (SENOKOT-S) 8.6-50 MG tablet Take 1 tablet by mouth in the morning and at bedtime. For Constipation   No facility-administered encounter medications on file as of 06/12/2023.     SIGNIFICANT DIAGNOSTIC EXAMS  PREVIOUS  09-07-19: DEXA: t score -1.06  NO NEW EXAMS.   LABS  REVIEWED PREVIOUS  10-04-22: wbc 4.6; hgb 12.0; hct 37.9; mcv 94.5 plt 157; glucose 75; bun 21; creat 0.70; k+ 3.6; na++ 143; ca 8.2 gfr >60; protein 6.0; albumin 3.3 chol 137; ldl 86; trig 55; hdl 40 12-11-22: wbc 4.5; hgb 13.8; hct 43.3; mcv 93.5 plt 189; glucose 75 bun 18; creat 0.69; k+ 3.1; na++ 141; ca 9.1; protein 7.4 albumin 4.0; tsh  2.415; vitamin B 12: 412  keppra 8.1  12-17-22: glucose 77; bun 19; creat 0.69; k+ 3.2; na++ 139; ca 8.0; gfr >60 12-24-22: glucose 77; bun 22; creat 0.75; k+ 3.7; na++ 140; ca 8.1; gfr >60 12-27-22: wbc 4.1; hgb 11.9; hct 36.9; mcv 92.7 plt 148 keppra 12.9  03-28-23: chol 175; ldl 127; trig 44; hdl 39  NO NEW LABS.     Review of Systems  Constitutional:  Negative for malaise/fatigue.  Respiratory:  Negative for cough and shortness of breath.   Cardiovascular:  Negative for chest pain, palpitations and leg swelling.  Gastrointestinal:  Negative for abdominal pain, constipation and heartburn.  Musculoskeletal:  Negative for back pain, joint pain and myalgias.  Skin: Negative.   Neurological:  Negative for dizziness.  Psychiatric/Behavioral:  The patient is not nervous/anxious.    Physical Exam Constitutional:      General: She is not in acute distress.    Appearance: She is well-developed. She is obese. She is not diaphoretic.  Neck:     Thyroid: No thyromegaly.  Cardiovascular:     Rate and Rhythm: Normal rate and regular rhythm.     Pulses: Normal pulses.     Heart sounds: Normal heart sounds.  Pulmonary:     Effort: Pulmonary effort is normal. No respiratory distress.     Breath sounds: Normal breath sounds.  Abdominal:     General: Bowel sounds are normal. There is no distension.     Palpations: Abdomen is soft.     Tenderness: There is no abdominal tenderness.  Musculoskeletal:     Cervical back: Neck supple.     Right lower leg: No edema.     Left lower leg: No edema.     Comments: Able to move all extremities   Lymphadenopathy:      Cervical: No cervical adenopathy.  Skin:    General: Skin is warm and dry.  Neurological:     Mental Status: She is alert. Mental status is at baseline.     Comments: 12-19-22: SLUMS 1/30   Psychiatric:        Mood and Affect: Mood normal.          ASSESSMENT/ PLAN:  TODAY:   Essential hypertension: b/p 111/49. She has had soft blood pressure readings; she has had 4 falls this past month without injuries. Will need to monitor her blood pressure closely for the next week; may need to consider midodrine therapy. Her falls could be related to soft blood pressure readings.  will continue to monitor   2. Chronic constipation: will continue senna s twice daily   3. Hypokalemia: k+ 3.7 will monitor   PREVIOUS     4. Hallucination and delusion/agitation due to dementia: her mood state is stable will continue rexulti 0.25 mg daily   5. Unspecified hyperlipidemia: LDL 127 is off statin due to advanced age   36. Unspecified obsessive compulsive disorder: will continue prozac 40 mg daily (has failed 2 weans)   7. Morbid obesity: BMI 32.89  8. Parkinson's disease: will continue sinemet cr 25/100 mg three times daily   9. Chronic generalized pain: will continue tylenol 650 mg twice daily   10. Seizure disorder (after CHI) no recent seizures reported; will continue keppra 250 mg in the AM and 500 mg in the PM.   11. Thrombocytopenia: plt 148   12. Dementia with agitation: weight is 210 pounds; is off namenda (not providing benefit) is on rexulti 0.25 mg daily for agitation  13. Simple chronic bronchitis: without flares: will continue advair 100/50 1 puff twice daily   Will check cbc; cmp; vitamin B 12 and tsh   Synthia Innocent NP [facility]  call [phone]

## 2023-06-13 ENCOUNTER — Other Ambulatory Visit (HOSPITAL_COMMUNITY)
Admission: RE | Admit: 2023-06-13 | Discharge: 2023-06-13 | Disposition: A | Discharge status: Home / Self Care | Payer: Medicare PPO | Source: Skilled Nursing Facility | Attending: Adult Health | Admitting: Adult Health

## 2023-06-13 DIAGNOSIS — I10 Essential (primary) hypertension: Secondary | ICD-10-CM | POA: Diagnosis present

## 2023-06-13 LAB — COMPREHENSIVE METABOLIC PANEL
ALT: 18 U/L (ref 0–44)
AST: 19 U/L (ref 15–41)
Albumin: 3.9 g/dL (ref 3.5–5.0)
Alkaline Phosphatase: 58 U/L (ref 38–126)
Anion gap: 9 (ref 5–15)
BUN: 15 mg/dL (ref 8–23)
CO2: 30 mmol/L (ref 22–32)
Calcium: 8.6 mg/dL — ABNORMAL LOW (ref 8.9–10.3)
Chloride: 100 mmol/L (ref 98–111)
Creatinine, Ser: 0.67 mg/dL (ref 0.44–1.00)
GFR, Estimated: >60 mL/min (ref >60)
Glucose, Bld: 96 mg/dL (ref 70–99)
Potassium: 3.7 mmol/L (ref 3.5–5.1)
Sodium: 139 mmol/L (ref 135–145)
Total Bilirubin: 0.7 mg/dL (ref 0.3–1.2)
Total Protein: 7.2 g/dL (ref 6.5–8.1)

## 2023-06-13 LAB — CBC
HCT: 41.7 % (ref 36.0–46.0)
Hemoglobin: 13.3 g/dL (ref 12.0–15.0)
MCH: 29.8 pg (ref 26.0–34.0)
MCHC: 31.9 g/dL (ref 30.0–36.0)
MCV: 93.5 fL (ref 80.0–100.0)
Platelets: 183 10*3/uL (ref 150–400)
RBC: 4.46 MIL/uL (ref 3.87–5.11)
RDW: 14 % (ref 11.5–15.5)
WBC: 4.4 10*3/uL (ref 4.0–10.5)
nRBC: 0 % (ref 0.0–0.2)

## 2023-06-13 LAB — VITAMIN B12: Vitamin B-12: 474 pg/mL (ref 180–914)

## 2023-06-13 LAB — TSH: TSH: 1.92 u[IU]/mL (ref 0.350–4.500)

## 2023-06-28 ENCOUNTER — Encounter: Payer: Self-pay | Admitting: Adult Health

## 2023-06-28 ENCOUNTER — Non-Acute Institutional Stay (SKILLED_NURSING_FACILITY): Payer: Medicare PPO | Admitting: Adult Health

## 2023-06-28 DIAGNOSIS — Z66 Do not resuscitate: Secondary | ICD-10-CM | POA: Diagnosis not present

## 2023-06-28 DIAGNOSIS — D696 Thrombocytopenia, unspecified: Secondary | ICD-10-CM | POA: Diagnosis not present

## 2023-06-28 DIAGNOSIS — F03918 Unspecified dementia, unspecified severity, with other behavioral disturbance: Secondary | ICD-10-CM

## 2023-06-28 DIAGNOSIS — E440 Moderate protein-calorie malnutrition: Secondary | ICD-10-CM | POA: Diagnosis not present

## 2023-06-28 NOTE — Progress Notes (Signed)
Location:  [facility] Room Number: North/116/D Place of Service:  SNF (31)   CODE STATUS: dnr   No Known Allergies  Chief Complaint  Patient presents with   Acute Visit    Care Plan Meeting    HPI:  We have come together for her care plan meeting. Family present BIMS 4/15 mood 0/30. she uses wheelchair has had four falls without injury. She requires maximum to dependent assist with her adls. She is frequently incontinent of bladder and bowel. Dietary: regular diet requires setup for meals; weight 210.6 pounds appetite down to 0-50%. Therapy: OT: self feeding fine motor skills. Activities: does participate. She continues to be followed for her chronic illnesses including:   Dementia with behavioral disturbance  Thrombocytopenia    Moderate protein calorie malnutrition  Past Medical History:  Diagnosis Date   Diverticulosis OCT 2010 TCS   SIGMOID COLON   Esophageal web 10/08/2008   DILATION 16 MM   Gastritis, Helicobacter pylori 10/08/2008   Rx. ABO for 10 days    GERD (gastroesophageal reflux disease)    Hip fracture, left (HCC)    2016   Hyperlipidemia    Hypertension    Restless legs syndrome     Past Surgical History:  Procedure Laterality Date   ABDOMINAL HYSTERECTOMY     COLONOSCOPY  OCT 2010   TORTUOUS, Sml IH, Tics, MULTIPLE SIMPLE ADENOMAS (<6MM)   HIP ARTHROPLASTY Left 11/19/2014   Procedure: LEFT PARTIAL HIP REPLACEMENT;  Surgeon: Vickki Hearing, MD;  Location: [facility];  Service: Orthopedics;  Laterality: Left;   ORIF HIP FRACTURE  04/05/2012   Procedure: OPEN REDUCTION INTERNAL FIXATION HIP;  Surgeon: Vickki Hearing, MD;  Location: [facility];  Service: Orthopedics;  Laterality: Right;   UPPER GASTROINTESTINAL ENDOSCOPY  AUG 2010   SAVARY   VESICOVAGINAL FISTULA CLOSURE W/ TAH  1966   bleeding     Social History   Socioeconomic History   Marital status: Widowed    Spouse name: Not on file   Number of children: Not on file    Years of education: Not on file   Highest education level: Not on file  Occupational History   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 agent    Occupation: retired   Tobacco Use   Smoking status: Never   Smokeless tobacco: Never  Vaping Use   Vaping status: Never Used  Substance and Sexual Activity   Alcohol use: No   Drug use: No   Sexual activity: Not Currently  Other Topics Concern   Not on file  Social History Narrative   Long term resident of [facility]    Social Determinants of Health   Financial Resource Strain: Low Risk  (07/14/2018)   Overall Financial Resource Strain (CARDIA)    Difficulty of Paying Living Expenses: Not hard at all  Food Insecurity: No Food Insecurity (07/14/2018)   Hunger Vital Sign    Worried About Running Out of Food in the Last Year: Never true    Ran Out of Food in the Last Year: Never true  Transportation Needs: No Transportation Needs (07/14/2018)   PRAPARE - Administrator, Civil Service (Medical): No    Lack of Transportation (Non-Medical): No  Physical Activity: Insufficiently Active (07/14/2018)   Exercise Vital Sign    Days of Exercise per Week: 4 days    Minutes of Exercise per Session: 20 min  Stress: Stress Concern Present (07/14/2018)   [employer] of Occupational  Health - Occupational Stress Questionnaire    Feeling of Stress : To some extent  Social Connections: Moderately Isolated (07/14/2018)   Social Connection and Isolation Panel [NHANES]    Frequency of Communication with Friends and Family: Twice a week    Frequency of Social Gatherings with Friends and Family: Once a week    Attends Religious Services: Never    Database administrator or Organizations: No    Attends Banker Meetings: Never    Marital Status: Widowed  Intimate Partner Violence: Not At Risk (07/14/2018)   Humiliation, Afraid, Rape, and Kick questionnaire    Fear of Current or Ex-Partner: No    Emotionally Abused: No     Physically Abused: No    Sexually Abused: No   Family History  Problem Relation Age of Onset   Cancer Mother        bladder    Diabetes Mother    Diabetes Father    Emphysema Father    GER disease Brother    Colon cancer Neg Hx    Colon polyps Neg Hx       VITAL SIGNS BP 108/64   Pulse 70   Temp 97.6 F (36.4 C)   Resp 18   Ht 5\' 7"  (1.702 m)   Wt 210 lb 9.6 oz (95.5 kg)   SpO2 95%   BMI 32.98 kg/m   Outpatient Encounter Medications as of 06/28/2023  Medication Sig   Acetaminophen (TYLENOL ARTHRITIS PAIN PO) Take 650 mg by mouth in the morning and at bedtime.   aspirin 81 MG chewable tablet Chew 81 mg by mouth daily.   Balsam Peru-Castor Oil (VENELEX) OINT Apply topically. Apply to sacrum, bilateral buttocks and coccyx qshift for MAD & excoriation.   bisacodyl (DULCOLAX) 10 MG suppository Place 10 mg rectally as needed.   brexpiprazole (REXULTI) 0.25 MG TABS tablet Take 0.25 mg by mouth daily at 2 PM.   Carbidopa-Levodopa ER (SINEMET CR) 25-100 MG tablet controlled release Take 1 tablet by mouth 3 (three) times daily.   FLUoxetine (PROZAC) 40 MG capsule Take 40 mg by mouth daily.   fluticasone-salmeterol (ADVAIR) 100-50 MCG/ACT AEPB Inhale 1 puff into the lungs 2 (two) times daily. Special Instructions: RINSE MOUTH AFTER USE. DISCARD AFTER 1 MONTH   levETIRAcetam (KEPPRA) 250 MG tablet Take 250 mg by mouth daily.   levETIRAcetam (KEPPRA) 500 MG tablet Take 500 mg by mouth at bedtime. 8 pm   Melatonin 5 MG TABS Take 5 mg by mouth at bedtime.   NON FORMULARY Diet type: Regular   potassium chloride (KLOR-CON) 10 MEQ tablet Take 10 mEq by mouth daily.   Salicylic Acid 40 % MISC Apply 1 patch topically at bedtime. 8 pm   sennosides-docusate sodium (SENOKOT-S) 8.6-50 MG tablet Take 1 tablet by mouth in the morning and at bedtime. For Constipation   No facility-administered encounter medications on file as of 06/28/2023.     SIGNIFICANT DIAGNOSTIC  EXAMS  PREVIOUS  09-07-19: DEXA: t score -1.06  NO NEW EXAMS.   LABS REVIEWED PREVIOUS  10-04-22: wbc 4.6; hgb 12.0; hct 37.9; mcv 94.5 plt 157; glucose 75; bun 21; creat 0.70; k+ 3.6; na++ 143; ca 8.2 gfr >60; protein 6.0; albumin 3.3 chol 137; ldl 86; trig 55; hdl 40 12-11-22: wbc 4.5; hgb 13.8; hct 43.3; mcv 93.5 plt 189; glucose 75 bun 18; creat 0.69; k+ 3.1; na++ 141; ca 9.1; protein 7.4 albumin 4.0; tsh 2.415; vitamin B 12: 412  keppra  8.1  12-17-22: glucose 77; bun 19; creat 0.69; k+ 3.2; na++ 139; ca 8.0; gfr >60 12-24-22: glucose 77; bun 22; creat 0.75; k+ 3.7; na++ 140; ca 8.1; gfr >60 12-27-22: wbc 4.1; hgb 11.9; hct 36.9; mcv 92.7 plt 148 keppra 12.9  03-28-23: chol 175; ldl 127; trig 44; hdl 39  NO NEW LABS.      Review of Systems  Constitutional:  Negative for malaise/fatigue.  Respiratory:  Negative for cough and shortness of breath.   Cardiovascular:  Negative for chest pain, palpitations and leg swelling.  Gastrointestinal:  Negative for abdominal pain, constipation and heartburn.  Musculoskeletal:  Negative for back pain, joint pain and myalgias.  Skin: Negative.   Neurological:  Negative for dizziness.  Psychiatric/Behavioral:  The patient is not nervous/anxious.    Physical Exam Constitutional:      General: She is not in acute distress.    Appearance: She is well-developed. She is obese. She is not diaphoretic.  Neck:     Thyroid: No thyromegaly.  Cardiovascular:     Rate and Rhythm: Normal rate and regular rhythm.     Pulses: Normal pulses.     Heart sounds: Normal heart sounds.  Pulmonary:     Effort: Pulmonary effort is normal. No respiratory distress.     Breath sounds: Normal breath sounds.  Abdominal:     General: Bowel sounds are normal. There is no distension.     Palpations: Abdomen is soft.     Tenderness: There is no abdominal tenderness.  Musculoskeletal:     Cervical back: Neck supple.     Right lower leg: No edema.     Left lower leg: No  edema.     Comments: Able to move all extremities   Lymphadenopathy:     Cervical: No cervical adenopathy.  Skin:    General: Skin is warm and dry.  Neurological:     Mental Status: She is alert. Mental status is at baseline.     Comments: 12-19-22: SLUMS 1/30    Psychiatric:        Mood and Affect: Mood normal.     ASSESSMENT/ PLAN:  TODAY  Dementia with behavioral disturbance Thrombocytopenia Moderate protein calorie malnutrition  Will continue current medications Will continue current plan of care Will continue to monitor her status  Time spent with patient: 40 minutes: medications; plan of care dietary.    Synthia Innocent NP [facility]   call [phone]

## 2023-07-15 ENCOUNTER — Non-Acute Institutional Stay (SKILLED_NURSING_FACILITY): Payer: Self-pay | Admitting: Adult Health

## 2023-07-15 ENCOUNTER — Encounter: Payer: Self-pay | Admitting: Adult Health

## 2023-07-15 DIAGNOSIS — F422 Mixed obsessional thoughts and acts: Secondary | ICD-10-CM

## 2023-07-15 DIAGNOSIS — R443 Hallucinations, unspecified: Secondary | ICD-10-CM | POA: Diagnosis not present

## 2023-07-15 DIAGNOSIS — E785 Hyperlipidemia, unspecified: Secondary | ICD-10-CM

## 2023-07-15 DIAGNOSIS — F22 Delusional disorders: Secondary | ICD-10-CM

## 2023-07-15 NOTE — Progress Notes (Unsigned)
Location:  [facility] Room Number: 116 Place of Service:  SNF (31)   CODE STATUS: dnr   No Known Allergies  Chief Complaint  Patient presents with   Medical Management of Chronic Issues          Hallucination and delusion/agitation due to dementia:   Unspecified hyperlipidemia: Unspecified obsessive compulsive disorder:     HPI:  She is a 89 year old long term resident of this facility being seen for the management of her chronic illnesses:  Hallucination and delusion/agitation due to dementia:   Unspecified hyperlipidemia: Unspecified obsessive compulsive disorder. There are no reports of recent falls. Her weight is without significant change. There are no reports of uncontrolled pain.   Past Medical History:  Diagnosis Date   Diverticulosis OCT 2010 TCS   SIGMOID COLON   Esophageal web 10/08/2008   DILATION 16 MM   Gastritis, Helicobacter pylori 10/08/2008   Rx. ABO for 10 days    GERD (gastroesophageal reflux disease)    Hip fracture, left (HCC)    2016   Hyperlipidemia    Hypertension    Restless legs syndrome     Past Surgical History:  Procedure Laterality Date   ABDOMINAL HYSTERECTOMY     COLONOSCOPY  OCT 2010   TORTUOUS, Sml IH, Tics, MULTIPLE SIMPLE ADENOMAS (<6MM)   HIP ARTHROPLASTY Left 11/19/2014   Procedure: LEFT PARTIAL HIP REPLACEMENT;  Surgeon: Vickki Hearing, MD;  Location: [facility];  Service: Orthopedics;  Laterality: Left;   ORIF HIP FRACTURE  04/05/2012   Procedure: OPEN REDUCTION INTERNAL FIXATION HIP;  Surgeon: Vickki Hearing, MD;  Location: [facility];  Service: Orthopedics;  Laterality: Right;   UPPER GASTROINTESTINAL ENDOSCOPY  AUG 2010   SAVARY   VESICOVAGINAL FISTULA CLOSURE W/ TAH  1966   bleeding     Social History   Socioeconomic History   Marital status: Widowed    Spouse name: Not on file   Number of children: Not on file   Years of education: Not on file   Highest education level: Not on file   Occupational History   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 agent    Occupation: retired   Tobacco Use   Smoking status: Never   Smokeless tobacco: Never  Vaping Use   Vaping status: Never Used  Substance and Sexual Activity   Alcohol use: No   Drug use: No   Sexual activity: Not Currently  Other Topics Concern   Not on file  Social History Narrative   Long term resident of [facility]    Social Determinants of Health   Financial Resource Strain: Low Risk  (07/14/2018)   Overall Financial Resource Strain (CARDIA)    Difficulty of Paying Living Expenses: Not hard at all  Food Insecurity: No Food Insecurity (07/14/2018)   Hunger Vital Sign    Worried About Running Out of Food in the Last Year: Never true    Ran Out of Food in the Last Year: Never true  Transportation Needs: No Transportation Needs (07/14/2018)   PRAPARE - Administrator, Civil Service (Medical): No    Lack of Transportation (Non-Medical): No  Physical Activity: Insufficiently Active (07/14/2018)   Exercise Vital Sign    Days of Exercise per Week: 4 days    Minutes of Exercise per Session: 20 min  Stress: Stress Concern Present (07/14/2018)   [employer] of Occupational Health - Occupational Stress Questionnaire    Feeling of Stress :  To some extent  Social Connections: Moderately Isolated (07/14/2018)   Social Connection and Isolation Panel [NHANES]    Frequency of Communication with Friends and Family: Twice a week    Frequency of Social Gatherings with Friends and Family: Once a week    Attends Religious Services: Never    Database administrator or Organizations: No    Attends Banker Meetings: Never    Marital Status: Widowed  Intimate Partner Violence: Not At Risk (07/14/2018)   Humiliation, Afraid, Rape, and Kick questionnaire    Fear of Current or Ex-Partner: No    Emotionally Abused: No    Physically Abused: No    Sexually Abused: No   Family History  Problem  Relation Age of Onset   Cancer Mother        bladder    Diabetes Mother    Diabetes Father    Emphysema Father    GER disease Brother    Colon cancer Neg Hx    Colon polyps Neg Hx       VITAL SIGNS BP 112/65   Pulse 64   Temp (!) 96.7 F (35.9 C)   Resp 20   Ht 5\' 7"  (1.702 m)   Wt 207 lb 12.8 oz (94.3 kg)   SpO2 96%   BMI 32.55 kg/m   Outpatient Encounter Medications as of 07/15/2023  Medication Sig   Acetaminophen (TYLENOL ARTHRITIS PAIN PO) Take 650 mg by mouth in the morning and at bedtime.   aspirin 81 MG chewable tablet Chew 81 mg by mouth daily.   Balsam Peru-Castor Oil (VENELEX) OINT Apply topically. Apply to sacrum, bilateral buttocks and coccyx qshift for MAD & excoriation.   bisacodyl (DULCOLAX) 10 MG suppository Place 10 mg rectally as needed.   brexpiprazole (REXULTI) 0.25 MG TABS tablet Take 0.25 mg by mouth daily at 2 PM.   Carbidopa-Levodopa ER (SINEMET CR) 25-100 MG tablet controlled release Take 1 tablet by mouth 3 (three) times daily.   FLUoxetine (PROZAC) 40 MG capsule Take 40 mg by mouth daily.   fluticasone-salmeterol (ADVAIR) 100-50 MCG/ACT AEPB Inhale 1 puff into the lungs 2 (two) times daily. Special Instructions: RINSE MOUTH AFTER USE. DISCARD AFTER 1 MONTH   levETIRAcetam (KEPPRA) 250 MG tablet Take 250 mg by mouth daily.   levETIRAcetam (KEPPRA) 500 MG tablet Take 500 mg by mouth at bedtime. 8 pm   Melatonin 5 MG TABS Take 5 mg by mouth at bedtime.   NON FORMULARY Diet type: Regular   potassium chloride (KLOR-CON) 10 MEQ tablet Take 10 mEq by mouth daily.   Salicylic Acid 40 % MISC Apply 1 patch topically at bedtime. 8 pm   sennosides-docusate sodium (SENOKOT-S) 8.6-50 MG tablet Take 1 tablet by mouth in the morning and at bedtime. For Constipation   No facility-administered encounter medications on file as of 07/15/2023.     SIGNIFICANT DIAGNOSTIC EXAMS  PREVIOUS  09-07-19: DEXA: t score -1.06  NO NEW EXAMS.   LABS REVIEWED  PREVIOUS  10-04-22: wbc 4.6; hgb 12.0; hct 37.9; mcv 94.5 plt 157; glucose 75; bun 21; creat 0.70; k+ 3.6; na++ 143; ca 8.2 gfr >60; protein 6.0; albumin 3.3 chol 137; ldl 86; trig 55; hdl 40 12-11-22: wbc 4.5; hgb 13.8; hct 43.3; mcv 93.5 plt 189; glucose 75 bun 18; creat 0.69; k+ 3.1; na++ 141; ca 9.1; protein 7.4 albumin 4.0; tsh 2.415; vitamin B 12: 412  keppra 8.1  12-17-22: glucose 77; bun 19; creat 0.69; k+ 3.2;  na++ 139; ca 8.0; gfr >60 12-24-22: glucose 77; bun 22; creat 0.75; k+ 3.7; na++ 140; ca 8.1; gfr >60 12-27-22: wbc 4.1; hgb 11.9; hct 36.9; mcv 92.7 plt 148 keppra 12.9  03-28-23: chol 175; ldl 127; trig 44; hdl 39  TODAY  06-13-23: wbc 4.4; hgb 13.3; hct 41.7; mcv 93.5 plt 183; glucose 96; bun 15; creat 0.67; k+ 3.7; na++ 139; ca 8.6; gfr >60; protein 7.2 albumin 3.9; tsh 1.920; vitamin B 12: 474     Review of Systems  Constitutional:  Negative for malaise/fatigue.  Respiratory:  Negative for cough and shortness of breath.   Cardiovascular:  Negative for chest pain, palpitations and leg swelling.  Gastrointestinal:  Negative for abdominal pain, constipation and heartburn.  Musculoskeletal:  Negative for back pain, joint pain and myalgias.  Skin: Negative.   Neurological:  Negative for dizziness.  Psychiatric/Behavioral:  The patient is not nervous/anxious.    Physical Exam Constitutional:      General: She is not in acute distress.    Appearance: She is well-developed. She is obese. She is not diaphoretic.  Neck:     Thyroid: No thyromegaly.  Cardiovascular:     Rate and Rhythm: Normal rate and regular rhythm.     Pulses: Normal pulses.     Heart sounds: Normal heart sounds.  Pulmonary:     Effort: Pulmonary effort is normal. No respiratory distress.     Breath sounds: Normal breath sounds.  Abdominal:     General: Bowel sounds are normal. There is no distension.     Palpations: Abdomen is soft.     Tenderness: There is no abdominal tenderness.  Musculoskeletal:      Cervical back: Neck supple.     Right lower leg: No edema.     Left lower leg: No edema.     Comments: Able to move all extremities  Lymphadenopathy:     Cervical: No cervical adenopathy.  Skin:    General: Skin is warm and dry.  Neurological:     Mental Status: She is alert. Mental status is at baseline.     Comments: 12-19-22: SLUMS 1/30     Psychiatric:        Mood and Affect: Mood normal.       ASSESSMENT/ PLAN:  TODAY:   Hallucination and delusion/agitation due to dementia: her mood state is stable will continue rexulti 0.25 mg daily   2. Unspecified hyperlipidemia: ldl 127 is off statin due to advanced age   35. Unspecified obsessive compulsive disorder: will continue prozac 40 mg daily (has failued 2 weans)   PREVIOUS     4. Unspecified obsessive compulsive disorder: will continue prozac 40 mg daily (has failed 2 weans)   5. Morbid obesity: BMI 32.55  6. Parkinson's disease: will continue sinemet cr 25/100 mg three times daily   7. Chronic generalized pain: will continue tylenol 650 mg twice daily   8. Seizure disorder (after CHI) no recent seizures reported; will continue keppra 250 mg in the AM and 500 mg in the PM.   9. Thrombocytopenia: plt 183   10. Dementia with agitation: weight is 207 pounds; is off namenda (not providing benefit) is on rexulti 0.25 mg daily for agitation   11. Simple chronic bronchitis: without flares: will continue advair 100/50 1 puff twice daily   12. Essential hypertension: b/p 112/65: is presently stable without recent falls.   13. Chronic constipation: will continue senna s twice daily   14. Hypokalemia: k+ 3.7  will monitor      Synthia Innocent NP [facility]  call [phone]

## 2023-08-13 ENCOUNTER — Non-Acute Institutional Stay (SKILLED_NURSING_FACILITY): Payer: Self-pay | Admitting: Adult Health

## 2023-08-13 DIAGNOSIS — G20A1 Parkinson's disease without dyskinesia, without mention of fluctuations: Secondary | ICD-10-CM

## 2023-08-13 DIAGNOSIS — F0280 Dementia in other diseases classified elsewhere without behavioral disturbance: Secondary | ICD-10-CM | POA: Diagnosis not present

## 2023-08-14 ENCOUNTER — Encounter: Payer: Self-pay | Admitting: Adult Health

## 2023-08-14 NOTE — Progress Notes (Signed)
Location:  [facility] Room Number: 116 Place of Service:  SNF (31)   CODE STATUS: dnr   No Known Allergies  Chief Complaint  Patient presents with   Acute Visit    Weight loss     HPI:  She has been losing weight weight. She has lost 12 pounds in the past 6 months with her current weight at 209.6 pounds. She has been declining globally over the past 6 months. She has had falls without injury. Speech therapy has seen her. She has had a good appetite most of the time. She does have hallucinations; including babies in the room.   Past Medical History:  Diagnosis Date   Diverticulosis OCT 2010 TCS   SIGMOID COLON   Esophageal web 10/08/2008   DILATION 16 MM   Gastritis, Helicobacter pylori 10/08/2008   Rx. ABO for 10 days    GERD (gastroesophageal reflux disease)    Hip fracture, left (HCC)    2016   Hyperlipidemia    Hypertension    Restless legs syndrome     Past Surgical History:  Procedure Laterality Date   ABDOMINAL HYSTERECTOMY     COLONOSCOPY  OCT 2010   TORTUOUS, Sml IH, Tics, MULTIPLE SIMPLE ADENOMAS (<6MM)   HIP ARTHROPLASTY Left 11/19/2014   Procedure: LEFT PARTIAL HIP REPLACEMENT;  Surgeon: Vickki Hearing, MD;  Location: [facility];  Service: Orthopedics;  Laterality: Left;   ORIF HIP FRACTURE  04/05/2012   Procedure: OPEN REDUCTION INTERNAL FIXATION HIP;  Surgeon: Vickki Hearing, MD;  Location: [facility];  Service: Orthopedics;  Laterality: Right;   UPPER GASTROINTESTINAL ENDOSCOPY  AUG 2010   SAVARY   VESICOVAGINAL FISTULA CLOSURE W/ TAH  1966   bleeding     Social History   Socioeconomic History   Marital status: Widowed    Spouse name: Not on file   Number of children: Not on file   Years of education: Not on file   Highest education level: Not on file  Occupational History   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 agent    Occupation: retired   Tobacco Use   Smoking status: Never   Smokeless tobacco: Never   Vaping Use   Vaping status: Never Used  Substance and Sexual Activity   Alcohol use: No   Drug use: No   Sexual activity: Not Currently  Other Topics Concern   Not on file  Social History Narrative   Long term resident of [facility]    Social Determinants of Health   Financial Resource Strain: Low Risk  (07/14/2018)   Overall Financial Resource Strain (CARDIA)    Difficulty of Paying Living Expenses: Not hard at all  Food Insecurity: No Food Insecurity (07/14/2018)   Hunger Vital Sign    Worried About Running Out of Food in the Last Year: Never true    Ran Out of Food in the Last Year: Never true  Transportation Needs: No Transportation Needs (07/14/2018)   PRAPARE - Administrator, Civil Service (Medical): No    Lack of Transportation (Non-Medical): No  Physical Activity: Insufficiently Active (07/14/2018)   Exercise Vital Sign    Days of Exercise per Week: 4 days    Minutes of Exercise per Session: 20 min  Stress: Stress Concern Present (07/14/2018)   [employer] of Occupational Health - Occupational Stress Questionnaire    Feeling of Stress : To some extent  Social Connections: Moderately Isolated (07/14/2018)   Social Connection and  Isolation Panel [NHANES]    Frequency of Communication with Friends and Family: Twice a week    Frequency of Social Gatherings with Friends and Family: Once a week    Attends Religious Services: Never    Database administrator or Organizations: No    Attends Banker Meetings: Never    Marital Status: Widowed  Intimate Partner Violence: Not At Risk (07/14/2018)   Humiliation, Afraid, Rape, and Kick questionnaire    Fear of Current or Ex-Partner: No    Emotionally Abused: No    Physically Abused: No    Sexually Abused: No   Family History  Problem Relation Age of Onset   Cancer Mother        bladder    Diabetes Mother    Diabetes Father    Emphysema Father    GER disease Brother    Colon cancer Neg Hx    Colon  polyps Neg Hx       VITAL SIGNS BP 111/66   Pulse (!) 55   Temp (!) 96.8 F (36 C)   Resp 20   Ht 5\' 7"  (1.702 m)   Wt 209 lb 9.6 oz (95.1 kg)   SpO2 95%   BMI 32.83 kg/m   Outpatient Encounter Medications as of 08/13/2023  Medication Sig   Acetaminophen (TYLENOL ARTHRITIS PAIN PO) Take 650 mg by mouth in the morning and at bedtime.   aspirin 81 MG chewable tablet Chew 81 mg by mouth daily.   Balsam Peru-Castor Oil (VENELEX) OINT Apply topically. Apply to sacrum, bilateral buttocks and coccyx qshift for MAD & excoriation.   bisacodyl (DULCOLAX) 10 MG suppository Place 10 mg rectally as needed.   brexpiprazole (REXULTI) 0.25 MG TABS tablet Take 0.25 mg by mouth daily at 2 PM.   Carbidopa-Levodopa ER (SINEMET CR) 25-100 MG tablet controlled release Take 1 tablet by mouth 3 (three) times daily.   FLUoxetine (PROZAC) 40 MG capsule Take 40 mg by mouth daily.   fluticasone-salmeterol (ADVAIR) 100-50 MCG/ACT AEPB Inhale 1 puff into the lungs 2 (two) times daily. Special Instructions: RINSE MOUTH AFTER USE. DISCARD AFTER 1 MONTH   levETIRAcetam (KEPPRA) 250 MG tablet Take 250 mg by mouth daily.   levETIRAcetam (KEPPRA) 500 MG tablet Take 500 mg by mouth at bedtime. 8 pm   Melatonin 5 MG TABS Take 5 mg by mouth at bedtime.   NON FORMULARY Diet type: Regular   potassium chloride (KLOR-CON) 10 MEQ tablet Take 10 mEq by mouth daily.   Salicylic Acid 40 % MISC Apply 1 patch topically at bedtime. 8 pm   sennosides-docusate sodium (SENOKOT-S) 8.6-50 MG tablet Take 1 tablet by mouth in the morning and at bedtime. For Constipation   No facility-administered encounter medications on file as of 08/13/2023.     SIGNIFICANT DIAGNOSTIC EXAMS  PREVIOUS  09-07-19: DEXA: t score -1.06  NO NEW EXAMS.   LABS REVIEWED PREVIOUS  10-04-22: wbc 4.6; hgb 12.0; hct 37.9; mcv 94.5 plt 157; glucose 75; bun 21; creat 0.70; k+ 3.6; na++ 143; ca 8.2 gfr >60; protein 6.0; albumin 3.3 chol 137; ldl 86; trig  55; hdl 40 12-11-22: wbc 4.5; hgb 13.8; hct 43.3; mcv 93.5 plt 189; glucose 75 bun 18; creat 0.69; k+ 3.1; na++ 141; ca 9.1; protein 7.4 albumin 4.0; tsh 2.415; vitamin B 12: 412  keppra 8.1  12-17-22: glucose 77; bun 19; creat 0.69; k+ 3.2; na++ 139; ca 8.0; gfr >60 12-24-22: glucose 77; bun 22; creat 0.75;  k+ 3.7; na++ 140; ca 8.1; gfr >60 12-27-22: wbc 4.1; hgb 11.9; hct 36.9; mcv 92.7 plt 148 keppra 12.9  03-28-23: chol 175; ldl 127; trig 44; hdl 39  TODAY  06-13-23: wbc 4.4; hgb 13.3; hct 41.7; mcv 93.5 plt 183; glucose 96; bun 15; creat 0.67; k+ 3.7; na++ 139; ca 8.6; gfr >60; protein 7.2 albumin 3.9; tsh 1.920; vitamin B 12: 474     Review of Systems  Reason unable to perform ROS: unable to fully participate.   Physical Exam Constitutional:      General: She is not in acute distress.    Appearance: She is well-developed. She is obese. She is not diaphoretic.  Neck:     Thyroid: No thyromegaly.  Cardiovascular:     Rate and Rhythm: Normal rate and regular rhythm.     Pulses: Normal pulses.     Heart sounds: Normal heart sounds.  Pulmonary:     Effort: Pulmonary effort is normal. No respiratory distress.     Breath sounds: Normal breath sounds.  Abdominal:     General: Bowel sounds are normal. There is no distension.     Palpations: Abdomen is soft.     Tenderness: There is no abdominal tenderness.  Musculoskeletal:     Cervical back: Neck supple.     Right lower leg: No edema.     Left lower leg: No edema.     Comments: Able to move all extremities   Lymphadenopathy:     Cervical: No cervical adenopathy.  Skin:    General: Skin is warm and dry.  Neurological:     Mental Status: She is alert. Mental status is at baseline.     Comments: 12-19-22: SLUMS 1/30      Psychiatric:        Mood and Affect: Mood normal.      ASSESSMENT/ PLAN:  TODAY  Dementia associated with parkinson's disease: she is declining globally. Her medications have been reduced as able. She requires  assist with her adls care. Will not make changes at this time.    Synthia Innocent NP [facility]   call [phone]

## 2023-08-19 ENCOUNTER — Non-Acute Institutional Stay (SKILLED_NURSING_FACILITY): Payer: Self-pay | Admitting: Student

## 2023-08-19 ENCOUNTER — Non-Acute Institutional Stay (INDEPENDENT_AMBULATORY_CARE_PROVIDER_SITE_OTHER): Payer: Self-pay | Admitting: Student

## 2023-08-19 DIAGNOSIS — W19XXXA Unspecified fall, initial encounter: Secondary | ICD-10-CM

## 2023-08-19 DIAGNOSIS — G20A1 Parkinson's disease without dyskinesia, without mention of fluctuations: Secondary | ICD-10-CM | POA: Insufficient documentation

## 2023-08-19 DIAGNOSIS — F0280 Dementia in other diseases classified elsewhere without behavioral disturbance: Secondary | ICD-10-CM | POA: Insufficient documentation

## 2023-08-19 DIAGNOSIS — F03918 Unspecified dementia, unspecified severity, with other behavioral disturbance: Secondary | ICD-10-CM | POA: Diagnosis not present

## 2023-08-19 NOTE — Progress Notes (Signed)
Location:  [facility]   Place of Service:   Activity Director's Office, [facility]   CODE STATUS: DNR  No Known Allergies  No chief complaint on file.   HPI: Patient is seen today after a fall that occurred last night.  Per review of nursing notes, around 1915 she was engaged by nursing staff due to attempting to get out of bed.  At that time, she was noted to be a bit agitated and resistant to staff efforts to assist her back into bed.  At around 1930 she was found to be on the floor at bedside sitting up, she was in no obvious distress and denied any injury at the time.  This morning, she is seen in the activity room in NAD.  On interviewing her in the activities director's office, she has no recollection of last night's events but denies any new pain or weakness.   Past Medical History:  Diagnosis Date   Diverticulosis OCT 2010 TCS   SIGMOID COLON   Esophageal web 10/08/2008   DILATION 16 MM   Gastritis, Helicobacter pylori 10/08/2008   Rx. ABO for 10 days    GERD (gastroesophageal reflux disease)    Hip fracture, left (HCC)    2016   Hyperlipidemia    Hypertension    Restless legs syndrome     Past Surgical History:  Procedure Laterality Date   ABDOMINAL HYSTERECTOMY     COLONOSCOPY  OCT 2010   TORTUOUS, Sml IH, Tics, MULTIPLE SIMPLE ADENOMAS (<6MM)   HIP ARTHROPLASTY Left 11/19/2014   Procedure: LEFT PARTIAL HIP REPLACEMENT;  Surgeon: Vickki Hearing, MD;  Location: [facility];  Service: Orthopedics;  Laterality: Left;   ORIF HIP FRACTURE  04/05/2012   Procedure: OPEN REDUCTION INTERNAL FIXATION HIP;  Surgeon: Vickki Hearing, MD;  Location: [facility];  Service: Orthopedics;  Laterality: Right;   UPPER GASTROINTESTINAL ENDOSCOPY  AUG 2010   SAVARY   VESICOVAGINAL FISTULA CLOSURE W/ TAH  1966   bleeding     Social History   Socioeconomic History   Marital status: Widowed    Spouse name: Not on file   Number of children: Not on file   Years of  education: Not on file   Highest education level: Not on file  Occupational History   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 agent    Occupation: retired   Tobacco Use   Smoking status: Never   Smokeless tobacco: Never  Vaping Use   Vaping status: Never Used  Substance and Sexual Activity   Alcohol use: No   Drug use: No   Sexual activity: Not Currently  Other Topics Concern   Not on file  Social History Narrative   Long term resident of [facility]    Social Determinants of Health   Financial Resource Strain: Low Risk  (07/14/2018)   Overall Financial Resource Strain (CARDIA)    Difficulty of Paying Living Expenses: Not hard at all  Food Insecurity: No Food Insecurity (07/14/2018)   Hunger Vital Sign    Worried About Running Out of Food in the Last Year: Never true    Ran Out of Food in the Last Year: Never true  Transportation Needs: No Transportation Needs (07/14/2018)   PRAPARE - Administrator, Civil Service (Medical): No    Lack of Transportation (Non-Medical): No  Physical Activity: Insufficiently Active (07/14/2018)   Exercise Vital Sign    Days of Exercise per Week: 4 days  Minutes of Exercise per Session: 20 min  Stress: Stress Concern Present (07/14/2018)   [employer] of Occupational Health - Occupational Stress Questionnaire    Feeling of Stress : To some extent  Social Connections: Moderately Isolated (07/14/2018)   Social Connection and Isolation Panel [NHANES]    Frequency of Communication with Friends and Family: Twice a week    Frequency of Social Gatherings with Friends and Family: Once a week    Attends Religious Services: Never    Database administrator or Organizations: No    Attends Banker Meetings: Never    Marital Status: Widowed  Intimate Partner Violence: Not At Risk (07/14/2018)   Humiliation, Afraid, Rape, and Kick questionnaire    Fear of Current or Ex-Partner: No    Emotionally Abused: No    Physically  Abused: No    Sexually Abused: No   Family History  Problem Relation Age of Onset   Cancer Mother        bladder    Diabetes Mother    Diabetes Father    Emphysema Father    GER disease Brother    Colon cancer Neg Hx    Colon polyps Neg Hx       VITAL SIGNS There were no vitals taken for this visit.  Outpatient Encounter Medications as of 08/19/2023  Medication Sig   Acetaminophen (TYLENOL ARTHRITIS PAIN PO) Take 650 mg by mouth in the morning and at bedtime.   aspirin 81 MG chewable tablet Chew 81 mg by mouth daily.   Balsam Peru-Castor Oil (VENELEX) OINT Apply topically. Apply to sacrum, bilateral buttocks and coccyx qshift for MAD & excoriation.   bisacodyl (DULCOLAX) 10 MG suppository Place 10 mg rectally as needed.   brexpiprazole (REXULTI) 0.25 MG TABS tablet Take 0.25 mg by mouth daily at 2 PM.   Carbidopa-Levodopa ER (SINEMET CR) 25-100 MG tablet controlled release Take 1 tablet by mouth 3 (three) times daily.   FLUoxetine (PROZAC) 40 MG capsule Take 40 mg by mouth daily.   fluticasone-salmeterol (ADVAIR) 100-50 MCG/ACT AEPB Inhale 1 puff into the lungs 2 (two) times daily. Special Instructions: RINSE MOUTH AFTER USE. DISCARD AFTER 1 MONTH   levETIRAcetam (KEPPRA) 250 MG tablet Take 250 mg by mouth daily.   levETIRAcetam (KEPPRA) 500 MG tablet Take 500 mg by mouth at bedtime. 8 pm   Melatonin 5 MG TABS Take 5 mg by mouth at bedtime.   NON FORMULARY Diet type: Regular   potassium chloride (KLOR-CON) 10 MEQ tablet Take 10 mEq by mouth daily.   Salicylic Acid 40 % MISC Apply 1 patch topically at bedtime. 8 pm   sennosides-docusate sodium (SENOKOT-S) 8.6-50 MG tablet Take 1 tablet by mouth in the morning and at bedtime. For Constipation   No facility-administered encounter medications on file as of 08/19/2023.     SIGNIFICANT DIAGNOSTIC EXAMS  Gen: In wheelchair NAD HENT: Head is atraumatic. Edentulous Resp: Normal WOB on RA MSK: Strength and ROM is symmetric across  all large joints. The joints and limbs are without swelling or tenderness to palpation. There is no bruising or overlying abrasions to suggest trauma.      ASSESSMENT/ PLAN: Demential With Behavioral Disturbance  Fall Patient was a fall from bed after a episode of agitation, given time of day and her known history of fairly advanced dementia, suspect this may be a sundowning phenomenon.  In discussions with staff, it sounds like this is not the first time that she has  exhibited these behaviors around this time of day.  Unfortunately, I believe this may prove difficult to manage given her history of not tolerating dose increases of her Rexulti in the past. She has not been seen by physical therapy since May of this year. -PT to see given recent fall -The more difficult management will be with her dementia/behavioral disturbances.  Has not been tolerated of Rexulti dose increases in the past.  Could consider the addition of pimavanserin given his specific indication for behavioral disturbances secondary to Parkinson's dementia.   Eliezer Mccoy, MD Contact [phone] Monday through Friday 8am- 5pm  After hours call [phone]

## 2023-08-19 NOTE — Progress Notes (Signed)
Opened in error under incorrect Epic Context.

## 2023-08-20 ENCOUNTER — Encounter: Payer: Self-pay | Admitting: Student

## 2023-08-20 NOTE — Addendum Note (Signed)
Addended by: Sharee Holster on: 08/20/2023 09:46 AM   Modules accepted: Level of Service

## 2023-08-22 ENCOUNTER — Encounter: Payer: Self-pay | Admitting: Internal Medicine

## 2023-08-22 ENCOUNTER — Non-Acute Institutional Stay (SKILLED_NURSING_FACILITY): Payer: Medicare PPO | Admitting: Internal Medicine

## 2023-08-22 DIAGNOSIS — G40909 Epilepsy, unspecified, not intractable, without status epilepticus: Secondary | ICD-10-CM | POA: Diagnosis not present

## 2023-08-22 DIAGNOSIS — G20A2 Parkinson's disease without dyskinesia, with fluctuations: Secondary | ICD-10-CM | POA: Diagnosis not present

## 2023-08-22 DIAGNOSIS — F01C11 Vascular dementia, severe, with agitation: Secondary | ICD-10-CM | POA: Diagnosis not present

## 2023-08-22 NOTE — Patient Instructions (Addendum)
See assessment and plan under each diagnosis in the problem list and acutely for this visit 

## 2023-08-22 NOTE — Progress Notes (Signed)
NURSING HOME LOCATION:  [facility] ROOM NUMBER:  [id number]  CODE STATUS: DNR  PCP:  Synthia Innocent NP  This is a nursing facility follow up visit of chronic medical diagnoses & to document compliance with Regulation 483.30 (c) in The Long Term Care Survey Manual Phase 2 which mandates caregiver visit ( visits can alternate among physician, PA or NP as per statutes) within 10 days of 30 days / 60 days/ 90 days post admission to SNF date    Interim medical record and care since last SNF visit was updated with review of diagnostic studies and change in clinical status since last visit were documented.  HPI: She is a permanent resident of this facility with medical diagnoses of hx of divertculosis, gastritis secondary to Helicobacter pylori, GERD, dyslipidemia, hypertension, history of seizure disorder, and dementia in the context of Parkinson's. Most recent labs were performed 06/13/23.  Mild hypocalcemia was present but calcium has risen from 8.1 up to 8.6.  H/H has risen from 11.9/36.9 up to 13.3/41.7 indicating resolution of the normochromic, normocytic anemia.  B12 and TSH were normal or therapeutic.  Review of systems: Dementia invalidated responses.  She was seen 11/11 for physical assessment after a fall the evening of 11/10. This was in the context of longstanding dementia with intermittent agitation.  She apparently was attempting to get out of bed and resisted aid from the staff to return to bed.  She shortly thereafter was found on the floor without obvious injury or distress.  When assessed she had no memory of the event.  With excessive agitation she had received increased doses of Rexulti but was unable to tolerate such. Staff reports that she has not been agitated recently but states she is often searching for her mother, daughter, or her grandchildren. A review of systems was totally negative.  She confabulated about her son. There was a college football game on TV and  she stated her son was playing in the game.  Constitutional: No fever, significant weight change, fatigue  Eyes: No redness, discharge, pain, vision change ENT/mouth: No nasal congestion,  purulent discharge, earache, change in hearing, sore throat  Cardiovascular: No chest pain, palpitations, paroxysmal nocturnal dyspnea, claudication, edema  Respiratory: No cough, sputum production, hemoptysis, DOE, significant snoring, apnea   Gastrointestinal: No heartburn, dysphagia, abdominal pain, nausea /vomiting, rectal bleeding, melena, change in bowels Genitourinary: No dysuria, hematuria, pyuria, incontinence, nocturia Musculoskeletal: No joint stiffness, joint swelling, weakness, pain Dermatologic: No rash, pruritus, change in appearance of skin Neurologic: No dizziness, headache, syncope, seizures, numbness, tingling Psychiatric: No significant anxiety, depression, insomnia, anorexia Endocrine: No change in hair/skin/nails, excessive thirst, excessive hunger, excessive urination  Hematologic/lymphatic: No significant bruising, lymphadenopathy, abnormal bleeding Allergy/immunology: No itchy/watery eyes, significant sneezing, urticaria, angioedema  Physical exam:  Pertinent or positive findings: She was in the wheelchair,apparently awaiting lunch.  Facies are blank; but  she intermittently opened and closed her mouth as if  trying to eat or talk.  She is edentulous.  Grade 1/2 systolic murmur at the base.  Abdomen is protuberant due to central obesity The pedal pulses are decreased.  Pitting edema of the lower extremities is present ;but she is wearing compression hose. . General appearance: Adequately nourished; no acute distress, increased work of breathing is present.   Lymphatic: No lymphadenopathy about the head, neck, axilla. Eyes: No conjunctival inflammation or lid edema is present. There is no scleral icterus. Ears:  External ear exam shows no significant lesions or deformities.  Nose:   External nasal examination shows no deformity or inflammation. Nasal mucosa are pink and moist without lesions, exudates Neck:  No thyromegaly, masses, tenderness noted.    Heart:  Normal rate and regular rhythm. S1 and S2 normal without gallop, click, rub .  Lungs: Chest clear to auscultation without wheezes, rhonchi, rales, rubs. Abdomen: Bowel sounds are normal. Abdomen is soft and nontender with no organomegaly, hernias, masses. GU: Deferred  Extremities:  No cyanosis, clubbing Neurologic exam :Balance, Rhomberg, finger to nose testing could not be completed due to clinical state Deep tendon reflexes are equal Skin: Warm & dry w/o tenting. No significant lesions or rash.  See summary under each active problem in the Problem List with associated updated therapeutic plan

## 2023-08-22 NOTE — Assessment & Plan Note (Addendum)
Staff reports intermittent exacerbations in the behavioral issues mainly as confusion, looking for relatives who are not here at the facility. She has not tolerated increased doses of Rexulti in the past; consideration could be given to  trial of Pimavanserin (Nuplazid) which may be of benefit with agitation in Parkinson's

## 2023-08-22 NOTE — Assessment & Plan Note (Signed)
Other than the intermittent agitation and persistent confusion; her Parkinson's is clinically stable. Neurology follow-up as scheduled.  No change in present regimen indicated.

## 2023-08-22 NOTE — Assessment & Plan Note (Addendum)
Staff reports no recent seizures on current regimen.  No change indicated; continue to monitor.

## 2023-09-03 ENCOUNTER — Encounter: Payer: Self-pay | Admitting: Adult Health

## 2023-09-03 NOTE — Progress Notes (Unsigned)
Location:  [facility] Room Number: 116 Place of Service:  SNF (31)   CODE STATUS: dnr   No Known Allergies  Chief Complaint  Patient presents with   Acute Visit    Care plan meeting     HPI:  We have come together for her care plan meeting. BIMS 4/15 mood 0/30. She uses wheelchair, she has had 4 falls without injury. She requires maximum to dependent assist with her adl care. She is incontinent of bladder and bowel. Dietary: regular diet, feeds self; appetite 76-100%; weight is 209 pounds. Therapy: none at this time. She will continue to be followed for her chronic illnesses including:   Simple chronic bronchitis   Dementia associated with parkinson's disease  Seizure disorder  Past Medical History:  Diagnosis Date   Diverticulosis OCT 2010 TCS   SIGMOID COLON   Esophageal web 10/08/2008   DILATION 16 MM   Gastritis, Helicobacter pylori 10/08/2008   Rx. ABO for 10 days    GERD (gastroesophageal reflux disease)    Hip fracture, left (HCC)    2016   Hyperlipidemia    Hypertension    Restless legs syndrome     Past Surgical History:  Procedure Laterality Date   ABDOMINAL HYSTERECTOMY     COLONOSCOPY  OCT 2010   TORTUOUS, Sml IH, Tics, MULTIPLE SIMPLE ADENOMAS (<6MM)   HIP ARTHROPLASTY Left 11/19/2014   Procedure: LEFT PARTIAL HIP REPLACEMENT;  Surgeon: Vickki Hearing, MD;  Location: [facility];  Service: Orthopedics;  Laterality: Left;   ORIF HIP FRACTURE  04/05/2012   Procedure: OPEN REDUCTION INTERNAL FIXATION HIP;  Surgeon: Vickki Hearing, MD;  Location: [facility];  Service: Orthopedics;  Laterality: Right;   UPPER GASTROINTESTINAL ENDOSCOPY  AUG 2010   SAVARY   VESICOVAGINAL FISTULA CLOSURE W/ TAH  1966   bleeding     Social History   Socioeconomic History   Marital status: Widowed    Spouse name: Not on file   Number of children: Not on file   Years of education: Not on file   Highest education level: Not on file  Occupational  History   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 agent    Occupation: retired   Tobacco Use   Smoking status: Never   Smokeless tobacco: Never  Vaping Use   Vaping status: Never Used  Substance and Sexual Activity   Alcohol use: No   Drug use: No   Sexual activity: Not Currently  Other Topics Concern   Not on file  Social History Narrative   Long term resident of [facility]    Social Determinants of Health   Financial Resource Strain: Low Risk  (07/14/2018)   Overall Financial Resource Strain (CARDIA)    Difficulty of Paying Living Expenses: Not hard at all  Food Insecurity: No Food Insecurity (07/14/2018)   Hunger Vital Sign    Worried About Running Out of Food in the Last Year: Never true    Ran Out of Food in the Last Year: Never true  Transportation Needs: No Transportation Needs (07/14/2018)   PRAPARE - Administrator, Civil Service (Medical): No    Lack of Transportation (Non-Medical): No  Physical Activity: Insufficiently Active (07/14/2018)   Exercise Vital Sign    Days of Exercise per Week: 4 days    Minutes of Exercise per Session: 20 min  Stress: Stress Concern Present (07/14/2018)   [employer] of Occupational Health - Occupational Stress Questionnaire  Feeling of Stress : To some extent  Social Connections: Moderately Isolated (07/14/2018)   Social Connection and Isolation Panel [NHANES]    Frequency of Communication with Friends and Family: Twice a week    Frequency of Social Gatherings with Friends and Family: Once a week    Attends Religious Services: Never    Database administrator or Organizations: No    Attends Banker Meetings: Never    Marital Status: Widowed  Intimate Partner Violence: Not At Risk (07/14/2018)   Humiliation, Afraid, Rape, and Kick questionnaire    Fear of Current or Ex-Partner: No    Emotionally Abused: No    Physically Abused: No    Sexually Abused: No   Family History  Problem Relation Age of  Onset   Cancer Mother        bladder    Diabetes Mother    Diabetes Father    Emphysema Father    GER disease Brother    Colon cancer Neg Hx    Colon polyps Neg Hx       VITAL SIGNS BP 128/67   Pulse 60   Temp (!) 97 F (36.1 C)   Resp 20   Ht 5\' 7"  (1.702 m)   Wt 209 lb 9.6 oz (95.1 kg)   SpO2 98%   BMI 32.83 kg/m   Outpatient Encounter Medications as of 09/04/2023  Medication Sig   Acetaminophen (TYLENOL ARTHRITIS PAIN PO) Take 650 mg by mouth in the morning and at bedtime.   aspirin 81 MG chewable tablet Chew 81 mg by mouth daily.   Balsam Peru-Castor Oil (VENELEX) OINT Apply topically. Apply to sacrum, bilateral buttocks and coccyx qshift for MAD & excoriation.   bisacodyl (DULCOLAX) 10 MG suppository Place 10 mg rectally as needed.   brexpiprazole (REXULTI) 0.25 MG TABS tablet Take 0.25 mg by mouth daily at 2 PM.   Carbidopa-Levodopa ER (SINEMET CR) 25-100 MG tablet controlled release Take 1 tablet by mouth 3 (three) times daily.   FLUoxetine (PROZAC) 40 MG capsule Take 40 mg by mouth daily.   fluticasone-salmeterol (ADVAIR) 100-50 MCG/ACT AEPB Inhale 1 puff into the lungs 2 (two) times daily. Special Instructions: RINSE MOUTH AFTER USE. DISCARD AFTER 1 MONTH   levETIRAcetam (KEPPRA) 250 MG tablet Take 250 mg by mouth daily.   levETIRAcetam (KEPPRA) 500 MG tablet Take 500 mg by mouth at bedtime. 8 pm   Melatonin 5 MG TABS Take 5 mg by mouth at bedtime.   NON FORMULARY Diet type: Regular   potassium chloride (KLOR-CON) 10 MEQ tablet Take 10 mEq by mouth daily.   Salicylic Acid 40 % MISC Apply 1 patch topically at bedtime. 8 pm   sennosides-docusate sodium (SENOKOT-S) 8.6-50 MG tablet Take 1 tablet by mouth in the morning and at bedtime. For Constipation   No facility-administered encounter medications on file as of 09/04/2023.     SIGNIFICANT DIAGNOSTIC EXAMS  PREVIOUS  09-07-19: DEXA: t score -1.06  NO NEW EXAMS.   LABS REVIEWED PREVIOUS  10-04-22: wbc  4.6; hgb 12.0; hct 37.9; mcv 94.5 plt 157; glucose 75; bun 21; creat 0.70; k+ 3.6; na++ 143; ca 8.2 gfr >60; protein 6.0; albumin 3.3 chol 137; ldl 86; trig 55; hdl 40 12-11-22: wbc 4.5; hgb 13.8; hct 43.3; mcv 93.5 plt 189; glucose 75 bun 18; creat 0.69; k+ 3.1; na++ 141; ca 9.1; protein 7.4 albumin 4.0; tsh 2.415; vitamin B 12: 412  keppra 8.1  12-17-22: glucose 77; bun 19;  creat 0.69; k+ 3.2; na++ 139; ca 8.0; gfr >60 12-24-22: glucose 77; bun 22; creat 0.75; k+ 3.7; na++ 140; ca 8.1; gfr >60 12-27-22: wbc 4.1; hgb 11.9; hct 36.9; mcv 92.7 plt 148 keppra 12.9  03-28-23: chol 175; ldl 127; trig 44; hdl 39 06-13-23: wbc 4.4; hgb 13.3; hct 41.7; mcv 93.5 plt 183; glucose 96; bun 15; creat 0.67; k+ 3.7; na++ 139; ca 8.6; gfr >60; protein 7.2 albumin 3.9; tsh 1.920; vitamin B 12: 474   NO NEW LABS.     Review of Systems  Unable to perform ROS: Dementia   Physical Exam Constitutional:      General: She is not in acute distress.    Appearance: She is well-developed. She is obese. She is not diaphoretic.  Neck:     Thyroid: No thyromegaly.  Cardiovascular:     Rate and Rhythm: Normal rate and regular rhythm.     Pulses: Normal pulses.     Heart sounds: Normal heart sounds.  Pulmonary:     Effort: Pulmonary effort is normal. No respiratory distress.     Breath sounds: Normal breath sounds.  Abdominal:     General: Bowel sounds are normal. There is no distension.     Palpations: Abdomen is soft.     Tenderness: There is no abdominal tenderness.  Musculoskeletal:     Cervical back: Neck supple.     Right lower leg: No edema.     Left lower leg: No edema.     Comments: Has bilateral lower extremity contractures from chronic wheelchair use   Lymphadenopathy:     Cervical: No cervical adenopathy.  Skin:    General: Skin is warm and dry.  Neurological:     Mental Status: She is alert. Mental status is at baseline.     Comments:  12-19-22: SLUMS 1/30       Psychiatric:        Mood and Affect:  Mood normal.    ASSESSMENT/ PLAN:  TODAY  Simple chronic bronchitis Dementia associated with parkinson's disease Seizure disorder  Will continue current medications She has failed wean from prozac X2 Will continue current plan of care Will continue to monitor her status.   Time spent with patient: 40 minutes: medications; falls; dietary.    Synthia Innocent NP [facility]   call [phone]

## 2023-09-04 ENCOUNTER — Non-Acute Institutional Stay (SKILLED_NURSING_FACILITY): Payer: Medicare PPO | Admitting: Adult Health

## 2023-09-04 DIAGNOSIS — J410 Simple chronic bronchitis: Secondary | ICD-10-CM | POA: Diagnosis not present

## 2023-09-04 DIAGNOSIS — G20A1 Parkinson's disease without dyskinesia, without mention of fluctuations: Secondary | ICD-10-CM

## 2023-09-04 DIAGNOSIS — G40909 Epilepsy, unspecified, not intractable, without status epilepticus: Secondary | ICD-10-CM

## 2023-09-04 DIAGNOSIS — F0280 Dementia in other diseases classified elsewhere without behavioral disturbance: Secondary | ICD-10-CM

## 2023-09-25 ENCOUNTER — Non-Acute Institutional Stay (SKILLED_NURSING_FACILITY): Payer: Self-pay | Admitting: Adult Health

## 2023-09-25 ENCOUNTER — Encounter: Payer: Self-pay | Admitting: Adult Health

## 2023-09-25 DIAGNOSIS — F422 Mixed obsessional thoughts and acts: Secondary | ICD-10-CM

## 2023-09-25 DIAGNOSIS — G8929 Other chronic pain: Secondary | ICD-10-CM

## 2023-09-25 DIAGNOSIS — G20A2 Parkinson's disease without dyskinesia, with fluctuations: Secondary | ICD-10-CM

## 2023-09-25 NOTE — Progress Notes (Signed)
Location:  [facility] Room Number: 116 Place of Service:  SNF (31)   CODE STATUS: dnr   No Known Allergies  Chief Complaint  Patient presents with   Medical Management of Chronic Issues          Unspecified obsessive compulsive disorder:      Morbid obesity: Parkinson's disease: Chronic generalized pain     HPI:  She is a 89 year old long term resident of this facility being seen for the management of her chronic illnesses:   Unspecified obsessive compulsive disorder:      Morbid obesity: Parkinson's disease: Chronic generalized pain. There are no reports of uncontrolled pain. There are no reports of agitation; no reports of depressive thoughts.   Past Medical History:  Diagnosis Date   Diverticulosis OCT 2010 TCS   SIGMOID COLON   Esophageal web 10/08/2008   DILATION 16 MM   Gastritis, Helicobacter pylori 10/08/2008   Rx. ABO for 10 days    GERD (gastroesophageal reflux disease)    Hip fracture, left (HCC)    2016   Hyperlipidemia    Hypertension    Restless legs syndrome     Past Surgical History:  Procedure Laterality Date   ABDOMINAL HYSTERECTOMY     COLONOSCOPY  OCT 2010   TORTUOUS, Sml IH, Tics, MULTIPLE SIMPLE ADENOMAS (<6MM)   HIP ARTHROPLASTY Left 11/19/2014   Procedure: LEFT PARTIAL HIP REPLACEMENT;  Surgeon: Vickki Hearing, MD;  Location: [facility];  Service: Orthopedics;  Laterality: Left;   ORIF HIP FRACTURE  04/05/2012   Procedure: OPEN REDUCTION INTERNAL FIXATION HIP;  Surgeon: Vickki Hearing, MD;  Location: [facility];  Service: Orthopedics;  Laterality: Right;   UPPER GASTROINTESTINAL ENDOSCOPY  AUG 2010   SAVARY   VESICOVAGINAL FISTULA CLOSURE W/ TAH  1966   bleeding     Social History   Socioeconomic History   Marital status: Widowed    Spouse name: Not on file   Number of children: Not on file   Years of education: Not on file   Highest education level: Not on file  Occupational History   Occupation: custodian /  nutritionist -Estate agent    Occupation: retired   Tobacco Use   Smoking status: Never   Smokeless tobacco: Never  Vaping Use   Vaping status: Never Used  Substance and Sexual Activity   Alcohol use: No   Drug use: No   Sexual activity: Not Currently  Other Topics Concern   Not on file  Social History Narrative   Long term resident of [facility]    Social Drivers of Health   Financial Resource Strain: Low Risk  (07/14/2018)   Overall Financial Resource Strain (CARDIA)    Difficulty of Paying Living Expenses: Not hard at all  Food Insecurity: No Food Insecurity (07/14/2018)   Hunger Vital Sign    Worried About Running Out of Food in the Last Year: Never true    Ran Out of Food in the Last Year: Never true  Transportation Needs: No Transportation Needs (07/14/2018)   PRAPARE - Administrator, Civil Service (Medical): No    Lack of Transportation (Non-Medical): No  Physical Activity: Insufficiently Active (07/14/2018)   Exercise Vital Sign    Days of Exercise per Week: 4 days    Minutes of Exercise per Session: 20 min  Stress: Stress Concern Present (07/14/2018)   [employer] of Occupational Health - Occupational Stress Questionnaire    Feeling  of Stress : To some extent  Social Connections: Moderately Isolated (07/14/2018)   Social Connection and Isolation Panel [NHANES]    Frequency of Communication with Friends and Family: Twice a week    Frequency of Social Gatherings with Friends and Family: Once a week    Attends Religious Services: Never    Database administrator or Organizations: No    Attends Banker Meetings: Never    Marital Status: Widowed  Intimate Partner Violence: Not At Risk (07/14/2018)   Humiliation, Afraid, Rape, and Kick questionnaire    Fear of Current or Ex-Partner: No    Emotionally Abused: No    Physically Abused: No    Sexually Abused: No   Family History  Problem Relation Age of Onset   Cancer Mother        bladder     Diabetes Mother    Diabetes Father    Emphysema Father    GER disease Brother    Colon cancer Neg Hx    Colon polyps Neg Hx       VITAL SIGNS BP 128/79   Pulse 62   Temp (!) 97.3 F (36.3 C)   Resp 20   Ht 5\' 8"  (1.727 m)   Wt 212 lb 9.6 oz (96.4 kg)   SpO2 97%   BMI 32.33 kg/m   Outpatient Encounter Medications as of 09/25/2023  Medication Sig   Acetaminophen (TYLENOL ARTHRITIS PAIN PO) Take 650 mg by mouth in the morning and at bedtime.   aspirin 81 MG chewable tablet Chew 81 mg by mouth daily.   Balsam Peru-Castor Oil (VENELEX) OINT Apply topically. Apply to sacrum, bilateral buttocks and coccyx qshift for MAD & excoriation.   bisacodyl (DULCOLAX) 10 MG suppository Place 10 mg rectally as needed.   brexpiprazole (REXULTI) 0.25 MG TABS tablet Take 0.25 mg by mouth daily at 2 PM.   Carbidopa-Levodopa ER (SINEMET CR) 25-100 MG tablet controlled release Take 1 tablet by mouth 3 (three) times daily.   FLUoxetine (PROZAC) 40 MG capsule Take 40 mg by mouth daily.   fluticasone-salmeterol (ADVAIR) 100-50 MCG/ACT AEPB Inhale 1 puff into the lungs 2 (two) times daily. Special Instructions: RINSE MOUTH AFTER USE. DISCARD AFTER 1 MONTH   levETIRAcetam (KEPPRA) 250 MG tablet Take 250 mg by mouth daily.   levETIRAcetam (KEPPRA) 500 MG tablet Take 500 mg by mouth at bedtime. 8 pm   Melatonin 5 MG TABS Take 5 mg by mouth at bedtime.   NON FORMULARY Diet type: Regular   potassium chloride (KLOR-CON) 10 MEQ tablet Take 10 mEq by mouth daily.   Salicylic Acid 40 % MISC Apply 1 patch topically at bedtime. 8 pm   sennosides-docusate sodium (SENOKOT-S) 8.6-50 MG tablet Take 1 tablet by mouth in the morning and at bedtime. For Constipation   No facility-administered encounter medications on file as of 09/25/2023.     SIGNIFICANT DIAGNOSTIC EXAMS  PREVIOUS  09-07-19: DEXA: t score -1.06  NO NEW EXAMS.   LABS REVIEWED PREVIOUS  10-04-22: wbc 4.6; hgb 12.0; hct 37.9; mcv 94.5 plt  157; glucose 75; bun 21; creat 0.70; k+ 3.6; na++ 143; ca 8.2 gfr >60; protein 6.0; albumin 3.3 chol 137; ldl 86; trig 55; hdl 40 12-11-22: wbc 4.5; hgb 13.8; hct 43.3; mcv 93.5 plt 189; glucose 75 bun 18; creat 0.69; k+ 3.1; na++ 141; ca 9.1; protein 7.4 albumin 4.0; tsh 2.415; vitamin B 12: 412  keppra 8.1  12-17-22: glucose 77; bun 19; creat  0.69; k+ 3.2; na++ 139; ca 8.0; gfr >60 12-24-22: glucose 77; bun 22; creat 0.75; k+ 3.7; na++ 140; ca 8.1; gfr >60 12-27-22: wbc 4.1; hgb 11.9; hct 36.9; mcv 92.7 plt 148 keppra 12.9  03-28-23: chol 175; ldl 127; trig 44; hdl 39 06-13-23: wbc 4.4; hgb 13.3; hct 41.7; mcv 93.5 plt 183; glucose 96; bun 15; creat 0.67; k+ 3.7; na++ 139; ca 8.6; gfr >60; protein 7.2 albumin 3.9; tsh 1.920; vitamin B 12: 474     NO NEW LABS.   Review of Systems  Unable to perform ROS: Dementia   Physical Exam Constitutional:      General: She is not in acute distress.    Appearance: She is well-developed. She is obese. She is not diaphoretic.  Neck:     Thyroid: No thyromegaly.  Cardiovascular:     Rate and Rhythm: Normal rate and regular rhythm.     Pulses: Normal pulses.     Heart sounds: Normal heart sounds.  Pulmonary:     Effort: Pulmonary effort is normal. No respiratory distress.     Breath sounds: Normal breath sounds.  Abdominal:     General: Bowel sounds are normal. There is no distension.     Palpations: Abdomen is soft.     Tenderness: There is no abdominal tenderness.  Musculoskeletal:     Cervical back: Neck supple.     Right lower leg: No edema.     Left lower leg: No edema.     Comments:  Has bilateral lower extremity contractures from chronic wheelchair use   Lymphadenopathy:     Cervical: No cervical adenopathy.  Skin:    General: Skin is warm and dry.  Neurological:     Mental Status: She is alert. Mental status is at baseline.     Comments: 12-19-22: SLUMS 1/30      Psychiatric:        Mood and Affect: Mood normal.        ASSESSMENT/  PLAN:  TODAY:   Unspecified obsessive compulsive disorder: will continue prozac 40 mg daily (has failed wean X2)  2. Morbid obesity: BMI 32.33 no significant change  3. Parkinson's disease: without change; will continue sinemet cr 25/100 mg three times daily   4. Chronic generalized pain: will continue tylenol 650 mg twice daily   PREVIOUS     5. Seizure disorder (after CHI) no recent seizures reported; will continue keppra 250 mg in the AM and 500 mg in the PM.   6. Thrombocytopenia: plt 183   7. Dementia with agitation: weight is 212 pounds; is off namenda (not providing benefit) is on rexulti 0.25 mg daily for agitation   8. Simple chronic bronchitis: without flares: will continue advair 100/50 1 puff twice daily   9. Essential hypertension: b/p 128/79: is presently stable is currently off medications.    10. Chronic constipation: will continue senna s twice daily   11. Hypokalemia: k+ 3.7 will monitor   12. Hallucination and delusion/agitation due to dementia: her mood state is stable will continue rexulti 0.25 mg daily   13. Unspecified hyperlipidemia: ldl 127 is off statin due to advanced age   20. Unspecified obsessive compulsive disorder: will continue prozac 40 mg daily (has failued 2 weans)     Synthia Innocent NP [facility]   call [phone]

## 2023-10-04 ENCOUNTER — Other Ambulatory Visit (HOSPITAL_COMMUNITY)
Admission: RE | Admit: 2023-10-04 | Discharge: 2023-10-04 | Disposition: A | Discharge status: Home / Self Care | Payer: Medicare PPO | Source: Skilled Nursing Facility | Attending: Adult Health | Admitting: Adult Health

## 2023-10-04 DIAGNOSIS — I10 Essential (primary) hypertension: Secondary | ICD-10-CM | POA: Diagnosis present

## 2023-10-04 LAB — BASIC METABOLIC PANEL
Anion gap: 5 (ref 5–15)
BUN: 21 mg/dL (ref 8–23)
CO2: 29 mmol/L (ref 22–32)
Calcium: 8.5 mg/dL — ABNORMAL LOW (ref 8.9–10.3)
Chloride: 103 mmol/L (ref 98–111)
Creatinine, Ser: 0.52 mg/dL (ref 0.44–1.00)
GFR, Estimated: >60 mL/min (ref >60)
Glucose, Bld: 82 mg/dL (ref 70–99)
Potassium: 3.8 mmol/L (ref 3.5–5.1)
Sodium: 137 mmol/L (ref 135–145)

## 2023-10-21 ENCOUNTER — Other Ambulatory Visit (HOSPITAL_COMMUNITY)
Admission: RE | Admit: 2023-10-21 | Discharge: 2023-10-21 | Disposition: A | Discharge status: Home / Self Care | Payer: Medicare PPO | Source: Skilled Nursing Facility | Attending: Adult Health | Admitting: Adult Health

## 2023-10-21 DIAGNOSIS — Z9181 History of falling: Secondary | ICD-10-CM | POA: Diagnosis not present

## 2023-10-21 DIAGNOSIS — I10 Essential (primary) hypertension: Secondary | ICD-10-CM | POA: Insufficient documentation

## 2023-10-21 DIAGNOSIS — E538 Deficiency of other specified B group vitamins: Secondary | ICD-10-CM | POA: Insufficient documentation

## 2023-10-21 DIAGNOSIS — Z5181 Encounter for therapeutic drug level monitoring: Secondary | ICD-10-CM | POA: Diagnosis not present

## 2023-10-21 DIAGNOSIS — G4089 Other seizures: Secondary | ICD-10-CM | POA: Insufficient documentation

## 2023-10-21 LAB — COMPREHENSIVE METABOLIC PANEL
ALT: <5 U/L (ref 0–44)
AST: 19 U/L (ref 15–41)
Albumin: 3.4 g/dL — ABNORMAL LOW (ref 3.5–5.0)
Alkaline Phosphatase: 53 U/L (ref 38–126)
Anion gap: 6 (ref 5–15)
BUN: 22 mg/dL (ref 8–23)
CO2: 29 mmol/L (ref 22–32)
Calcium: 8.4 mg/dL — ABNORMAL LOW (ref 8.9–10.3)
Chloride: 102 mmol/L (ref 98–111)
Creatinine, Ser: 0.55 mg/dL (ref 0.44–1.00)
GFR, Estimated: >60 mL/min (ref >60)
Glucose, Bld: 79 mg/dL (ref 70–99)
Potassium: 4.1 mmol/L (ref 3.5–5.1)
Sodium: 137 mmol/L (ref 135–145)
Total Bilirubin: 0.4 mg/dL (ref 0.0–1.2)
Total Protein: 6.5 g/dL (ref 6.5–8.1)

## 2023-10-21 LAB — CBC
HCT: 38.8 % (ref 36.0–46.0)
Hemoglobin: 12.6 g/dL (ref 12.0–15.0)
MCH: 29.9 pg (ref 26.0–34.0)
MCHC: 32.5 g/dL (ref 30.0–36.0)
MCV: 92.2 fL (ref 80.0–100.0)
Platelets: 178 10*3/uL (ref 150–400)
RBC: 4.21 MIL/uL (ref 3.87–5.11)
RDW: 13.7 % (ref 11.5–15.5)
WBC: 4.5 10*3/uL (ref 4.0–10.5)
nRBC: 0 % (ref 0.0–0.2)

## 2023-10-21 LAB — TSH: TSH: 1.873 u[IU]/mL (ref 0.350–4.500)

## 2023-10-22 LAB — LEVETIRACETAM LEVEL: Levetiracetam Lvl: 8.6 ug/mL — ABNORMAL LOW (ref 10.0–40.0)

## 2023-11-05 ENCOUNTER — Encounter: Payer: Self-pay | Admitting: Adult Health

## 2023-11-05 ENCOUNTER — Non-Acute Institutional Stay (SKILLED_NURSING_FACILITY): Payer: Medicare PPO | Admitting: Adult Health

## 2023-11-05 DIAGNOSIS — G40909 Epilepsy, unspecified, not intractable, without status epilepticus: Secondary | ICD-10-CM | POA: Diagnosis not present

## 2023-11-05 DIAGNOSIS — F0280 Dementia in other diseases classified elsewhere without behavioral disturbance: Secondary | ICD-10-CM | POA: Diagnosis not present

## 2023-11-05 DIAGNOSIS — D696 Thrombocytopenia, unspecified: Secondary | ICD-10-CM | POA: Diagnosis not present

## 2023-11-05 DIAGNOSIS — G20A1 Parkinson's disease without dyskinesia, without mention of fluctuations: Secondary | ICD-10-CM

## 2023-11-05 NOTE — Progress Notes (Unsigned)
Location:  [facility] Room Number: 113 Place of Service:  SNF (31)   CODE STATUS: dnr   No Known Allergies  Chief Complaint  Patient presents with   Medical Management of Chronic Issues       Seizure disorder:    Thrombocytopenia: Dementia with agitation     HPI:  She is a 89 year old long term resident of this facility being seen for the management of her chronic illnesses: Seizure disorder:    Thrombocytopenia: Dementia with agitation. No reports of uncontrolled pain. No reports of excessive bruising; no reports of seizure activity   Past Medical History:  Diagnosis Date   Diverticulosis OCT 2010 TCS   SIGMOID COLON   Esophageal web 10/08/2008   DILATION 16 MM   Gastritis, Helicobacter pylori 10/08/2008   Rx. ABO for 10 days    GERD (gastroesophageal reflux disease)    Hip fracture, left (HCC)    2016   Hyperlipidemia    Hypertension    Restless legs syndrome     Past Surgical History:  Procedure Laterality Date   ABDOMINAL HYSTERECTOMY     COLONOSCOPY  OCT 2010   TORTUOUS, Sml IH, Tics, MULTIPLE SIMPLE ADENOMAS (<6MM)   HIP ARTHROPLASTY Left 11/19/2014   Procedure: LEFT PARTIAL HIP REPLACEMENT;  Surgeon: Vickki Hearing, MD;  Location: [facility];  Service: Orthopedics;  Laterality: Left;   ORIF HIP FRACTURE  04/05/2012   Procedure: OPEN REDUCTION INTERNAL FIXATION HIP;  Surgeon: Vickki Hearing, MD;  Location: [facility];  Service: Orthopedics;  Laterality: Right;   UPPER GASTROINTESTINAL ENDOSCOPY  AUG 2010   SAVARY   VESICOVAGINAL FISTULA CLOSURE W/ TAH  1966   bleeding     Social History   Socioeconomic History   Marital status: Widowed    Spouse name: Not on file   Number of children: Not on file   Years of education: Not on file   Highest education level: Not on file  Occupational History   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 agent    Occupation: retired   Tobacco Use   Smoking status: Never   Smokeless  tobacco: Never  Vaping Use   Vaping status: Never Used  Substance and Sexual Activity   Alcohol use: No   Drug use: No   Sexual activity: Not Currently  Other Topics Concern   Not on file  Social History Narrative   Long term resident of [facility]    Social Drivers of Health   Financial Resource Strain: Low Risk  (07/14/2018)   Overall Financial Resource Strain (CARDIA)    Difficulty of Paying Living Expenses: Not hard at all  Food Insecurity: No Food Insecurity (07/14/2018)   Hunger Vital Sign    Worried About Running Out of Food in the Last Year: Never true    Ran Out of Food in the Last Year: Never true  Transportation Needs: No Transportation Needs (07/14/2018)   PRAPARE - Administrator, Civil Service (Medical): No    Lack of Transportation (Non-Medical): No  Physical Activity: Insufficiently Active (07/14/2018)   Exercise Vital Sign    Days of Exercise per Week: 4 days    Minutes of Exercise per Session: 20 min  Stress: Stress Concern Present (07/14/2018)   [employer] of Occupational Health - Occupational Stress Questionnaire    Feeling of Stress : To some extent  Social Connections: Moderately Isolated (07/14/2018)   Social Connection and Isolation Panel [NHANES]  Frequency of Communication with Friends and Family: Twice a week    Frequency of Social Gatherings with Friends and Family: Once a week    Attends Religious Services: Never    Database administrator or Organizations: No    Attends Banker Meetings: Never    Marital Status: Widowed  Intimate Partner Violence: Not At Risk (07/14/2018)   Humiliation, Afraid, Rape, and Kick questionnaire    Fear of Current or Ex-Partner: No    Emotionally Abused: No    Physically Abused: No    Sexually Abused: No   Family History  Problem Relation Age of Onset   Cancer Mother        bladder    Diabetes Mother    Diabetes Father    Emphysema Father    GER disease Brother    Colon cancer Neg Hx     Colon polyps Neg Hx       VITAL SIGNS BP (!) 103/57   Pulse 63   Temp (!) 97.2 F (36.2 C)   Resp 20   Ht 5\' 8"  (1.727 m)   Wt 207 lb (93.9 kg)   SpO2 98%   BMI 31.47 kg/m   Outpatient Encounter Medications as of 11/05/2023  Medication Sig   Acetaminophen (TYLENOL ARTHRITIS PAIN PO) Take 650 mg by mouth in the morning and at bedtime.   aspirin 81 MG chewable tablet Chew 81 mg by mouth daily.   Balsam [place]-Castor Oil (VENELEX) OINT Apply topically. Apply to sacrum, bilateral buttocks and coccyx qshift for MAD & excoriation.   bisacodyl (DULCOLAX) 10 MG suppository Place 10 mg rectally as needed.   brexpiprazole (REXULTI) 0.25 MG TABS tablet Take 0.25 mg by mouth daily at 2 PM.   Carbidopa-Levodopa ER (SINEMET CR) 25-100 MG tablet controlled release Take 1 tablet by mouth 3 (three) times daily.   FLUoxetine (PROZAC) 40 MG capsule Take 40 mg by mouth daily.   fluticasone-salmeterol (ADVAIR) 100-50 MCG/ACT AEPB Inhale 1 puff into the lungs 2 (two) times daily. Special Instructions: RINSE MOUTH AFTER USE. DISCARD AFTER 1 MONTH   levETIRAcetam (KEPPRA) 250 MG tablet Take 250 mg by mouth daily.   levETIRAcetam (KEPPRA) 500 MG tablet Take 500 mg by mouth at bedtime. 8 pm   Melatonin 5 MG TABS Take 5 mg by mouth at bedtime.   NON FORMULARY Diet type: Regular   potassium chloride (KLOR-CON) 10 MEQ tablet Take 10 mEq by mouth daily.   Salicylic Acid 40 % MISC Apply 1 patch topically at bedtime. 8 pm   sennosides-docusate sodium (SENOKOT-S) 8.6-50 MG tablet Take 1 tablet by mouth in the morning and at bedtime. For Constipation   No facility-administered encounter medications on file as of 11/05/2023.     SIGNIFICANT DIAGNOSTIC EXAMS  PREVIOUS  09-07-19: DEXA: t score -1.06  NO NEW EXAMS.   LABS REVIEWED PREVIOUS  12-11-22: wbc 4.5; hgb 13.8; hct 43.3; mcv 93.5 plt 189; glucose 75 bun 18; creat 0.69; k+ 3.1; na++ 141; ca 9.1; protein 7.4 albumin 4.0; tsh 2.415; vitamin B 12: 412   keppra 8.1  12-17-22: glucose 77; bun 19; creat 0.69; k+ 3.2; na++ 139; ca 8.0; gfr >60 12-24-22: glucose 77; bun 22; creat 0.75; k+ 3.7; na++ 140; ca 8.1; gfr >60 12-27-22: wbc 4.1; hgb 11.9; hct 36.9; mcv 92.7 plt 148 keppra 12.9  03-28-23: chol 175; ldl 127; trig 44; hdl 39 06-13-23: wbc 4.4; hgb 13.3; hct 41.7; mcv 93.5 plt 183; glucose 96; bun  15; creat 0.67; k+ 3.7; na++ 139; ca 8.6; gfr >60; protein 7.2 albumin 3.9; tsh 1.920; vitamin B 12: 474     TODAY  10-04-23: glucose 82; bun 21; creat 0.52; k+ 3.8; na++ 137; ca 8.5; gfr >60 10-21-23: wbc 4.5; hgb 12.6; hct 38.8; mcv 92.2 plt 178; glucose 79; bun 22; creat 0.55; k+ 4.1; na++ 137; ca 8.4 gfr >60 protein 6.5 albumin 3.4 tsh 1.873; keppra 8.6    Review of Systems  Unable to perform ROS: Dementia   Physical Exam Constitutional:      General: She is not in acute distress.    Appearance: She is well-developed. She is obese. She is not diaphoretic.  Neck:     Thyroid: No thyromegaly.  Cardiovascular:     Rate and Rhythm: Normal rate and regular rhythm.     Pulses: Normal pulses.     Heart sounds: Normal heart sounds.  Pulmonary:     Effort: Pulmonary effort is normal. No respiratory distress.     Breath sounds: Normal breath sounds.  Abdominal:     General: Bowel sounds are normal. There is no distension.     Palpations: Abdomen is soft.     Tenderness: There is no abdominal tenderness.  Musculoskeletal:     Cervical back: Neck supple.     Right lower leg: No edema.     Left lower leg: No edema.     Comments: Has bilateral lower extremity contractures from chronic wheelchair use    Lymphadenopathy:     Cervical: No cervical adenopathy.  Skin:    General: Skin is warm and dry.  Neurological:     Mental Status: She is alert. Mental status is at baseline.     Comments: SLUMS 1/30       Psychiatric:        Mood and Affect: Mood normal.        ASSESSMENT/ PLAN:  TODAY:   Seizure disorder: (after CHI); no recent activity:  will continue keppra 250 mg in the AM and 500 mg in the PM  2. Thrombocytopenia: plt 178  3. Dementia with agitation: weight is 207 pounds: she is off namenda (not providing benefit); is on rexulti 0.25 mg daily for agitation.    PREVIOUS     4. Simple chronic bronchitis: without flares: will continue advair 100/50 1 puff twice daily   5. Essential hypertension: b/p 103/57: is presently stable is currently off medications.    6. Chronic constipation: will continue senna s twice daily   7. Hypokalemia: k+ 3.8 will monitor   8. Hallucination and delusion/agitation due to dementia: her mood state is stable will continue rexulti 0.25 mg daily   9. Unspecified hyperlipidemia: ldl 127 is off statin due to advanced age   22. Unspecified obsessive compulsive disorder: will continue prozac 40 mg daily (has failed wean X2)   11. Morbid obesity: BMI 31.47 no significant change  12. Parkinson's disease: without change; will continue sinemet cr 25/100 mg three times daily   13. Chronic generalized pain: will continue tylenol 650 mg twice daily   Synthia Innocent NP [facility]  call [phone]

## 2023-11-12 ENCOUNTER — Non-Acute Institutional Stay (SKILLED_NURSING_FACILITY): Payer: Self-pay | Admitting: Internal Medicine

## 2023-11-12 ENCOUNTER — Encounter: Payer: Self-pay | Admitting: Internal Medicine

## 2023-11-12 DIAGNOSIS — G20A1 Parkinson's disease without dyskinesia, without mention of fluctuations: Secondary | ICD-10-CM

## 2023-11-12 DIAGNOSIS — F0280 Dementia in other diseases classified elsewhere without behavioral disturbance: Secondary | ICD-10-CM

## 2023-11-12 DIAGNOSIS — G40909 Epilepsy, unspecified, not intractable, without status epilepticus: Secondary | ICD-10-CM

## 2023-11-12 DIAGNOSIS — E440 Moderate protein-calorie malnutrition: Secondary | ICD-10-CM

## 2023-11-12 NOTE — Assessment & Plan Note (Deleted)
No seizures reported on current regimen. Ca++ will be checked as Keppra can lower Ca++.

## 2023-11-12 NOTE — Patient Instructions (Signed)
 See assessment and plan under each diagnosis in the problem list and acutely for this visit

## 2023-11-12 NOTE — Assessment & Plan Note (Addendum)
 Today she was markedly lethargic and minimally interactive.  She denies any active symptoms.  The lethargy has been an intermittent phenomena.  Her anemia has resolved with no evidence of macrocytosis to suggest B12 deficiency. Also TSH is therapeutic.  Keppra  levels have not been elevated.  Continue to monitor for persistent mental status changes.

## 2023-11-12 NOTE — Progress Notes (Signed)
   NURSING HOME LOCATION:  [facility] ROOM NUMBER:  113  CODE STATUS:  DNR  PCP:  Barnie Seip NP  This is a nursing facility follow up visit of chronic medical diagnoses & to document compliance with Regulation 483.30 (c) in The Long Term Care Survey Manual Phase 2 which mandates caregiver visit ( visits can alternate among physician, PA or NP as per statutes) within 10 days of 30 days / 60 days/ 90 days post admission to SNF date    Interim medical record and care since last SNF visit was updated with review of diagnostic studies and change in clinical status since last visit were documented.  HPI: She is a permanent resident of this facility with medical diagnoses of essential hypertension, dyslipidemia, RLS, GERD, history of Helicobacter pylori gastritis, and history of diverticulosis. Most recent labs were performed 10/21/2023.  There has been slight progression of hypocalcemia with current values of 8.4.  On 06/13/2023 calcium  had been 8.6; actually representing an improvement from a value of 8.0 on 12/17/2022. Last vitamin D  level was 40.9 on 07/11/2018. On 10/21/2023 albumin  was 3.4 and total protein 6.5.  Prior values on 06/13/2023 had been 3.9 and 7.2. Also on 1/13 CBC was normal; she has a history of anemia.  TSH was therapeutic at 1.873.  Review of systems: Dementia invalidated responses.  She denied any active symptoms.  She was profoundly lethargic and minimally interactive.  Physical exam:  Pertinent or positive findings: She was asleep in the wheelchair by bedside.  She was exhibiting hypopnea without snoring or frank apnea.  She was intermittently pushing the lower lip out.  She did awaken but remained very lethargic and minimally interactive.  Facies were blank.  She did exhibit slight exotropia of the right eye.  Clinically she appears to be edentulous.  Heart sounds are markedly distant and rhythm is slightly irregular.  Grade 1/2 systolic murmur was suggested.   Breath sounds are decreased.  Abdomen is protuberant.  She has trace-1/2+ edema at the sock line.  Pedal pulses are decreased palpation.  General appearance:  no acute distress, increased work of breathing is present.   Lymphatic: No lymphadenopathy about the head, neck, axilla. Eyes: No conjunctival inflammation or lid edema is present. There is no scleral icterus. Ears:  External ear exam shows no significant lesions or deformities.   Nose:  External nasal examination shows no deformity or inflammation. Nasal mucosa are pink and moist without lesions, exudates Neck:  No thyromegaly, masses, tenderness noted.    Heart:  No gallop, click, rub .  Lungs:  without wheezes, rhonchi, rales, rubs. Abdomen: Bowel sounds are normal. Abdomen is soft and nontender with no organomegaly, hernias, masses. GU: Deferred  Extremities:  No cyanosis, clubbing  Neurologic exam :Balance, Rhomberg, finger to nose testing could not be completed due to clinical state Skin: Warm & dry w/o tenting. No significant lesions or rash.  See summary under each active problem in the Problem List with associated updated therapeutic plan

## 2023-11-12 NOTE — Assessment & Plan Note (Addendum)
Since 06/13/2023 albumin has decreased from 3.9 to 3.4 on 10/21/2023. Total protein decressed from 7.2 to 6.5 as of same date.She is not on a protein supplement. Nutritionist to assess.

## 2023-11-12 NOTE — Assessment & Plan Note (Addendum)
 12/17/22 Ca++ 8.0 06/13/23 Ca++ 8.6 10/21/23 Ca+= 8.4; alk phos 53. Last vit D,25 OH level 40.9 in 2019. She is on Keppra  which can lower Ca++. She is not on Ca++ or vit D supplement. Discuss with NP

## 2023-11-12 NOTE — Assessment & Plan Note (Addendum)
 No seizures reported on current regimen. Continue monitor.

## 2023-12-05 ENCOUNTER — Non-Acute Institutional Stay (SKILLED_NURSING_FACILITY): Payer: Self-pay | Admitting: Adult Health

## 2023-12-05 ENCOUNTER — Encounter: Payer: Self-pay | Admitting: Adult Health

## 2023-12-05 DIAGNOSIS — G20A1 Parkinson's disease without dyskinesia, without mention of fluctuations: Secondary | ICD-10-CM

## 2023-12-05 DIAGNOSIS — F0280 Dementia in other diseases classified elsewhere without behavioral disturbance: Secondary | ICD-10-CM

## 2023-12-05 DIAGNOSIS — G40909 Epilepsy, unspecified, not intractable, without status epilepticus: Secondary | ICD-10-CM

## 2023-12-05 DIAGNOSIS — G20A2 Parkinson's disease without dyskinesia, with fluctuations: Secondary | ICD-10-CM

## 2023-12-05 NOTE — Progress Notes (Signed)
 Location:  [facility] Room Number: 116 D Place of Service:  SNF (31) Hennessy Bartel S,NP  CODE STATUS: DNR  No Known Allergies  Chief Complaint  Patient presents with   Acute Visit    Care plan meeting.    HPI:  We have come together for her care plan meeting. BIMS 5/15 mood 6/30. She uses a wheelchair; has had 5 falls without injury. She requires dependent assist with her adl care. She is incontinent of bladder and frequently incontinent of bowel. Dietary: supervision for meals; weight is 206.8 pounds; D3 diet appetite >75%. Therapy: none at this time. She continues to be followed for her chronic illnesses including: Parkinson's disease without dyskinesia with fluctuating manifestations  Seizure disorder    Dementia associated with parkinson's disease   Past Medical History:  Diagnosis Date   Diverticulosis OCT 2010 TCS   SIGMOID COLON   Esophageal web 10/08/2008   DILATION 16 MM   Gastritis, Helicobacter pylori 10/08/2008   Rx. ABO for 10 days    GERD (gastroesophageal reflux disease)    Hip fracture, left (HCC)    2016   Hyperlipidemia    Hypertension    Restless legs syndrome     Past Surgical History:  Procedure Laterality Date   ABDOMINAL HYSTERECTOMY     COLONOSCOPY  OCT 2010   TORTUOUS, Sml IH, Tics, MULTIPLE SIMPLE ADENOMAS (<6MM)   HIP ARTHROPLASTY Left 11/19/2014   Procedure: LEFT PARTIAL HIP REPLACEMENT;  Surgeon: Vickki Hearing, MD;  Location: [facility];  Service: Orthopedics;  Laterality: Left;   ORIF HIP FRACTURE  04/05/2012   Procedure: OPEN REDUCTION INTERNAL FIXATION HIP;  Surgeon: Vickki Hearing, MD;  Location: [facility];  Service: Orthopedics;  Laterality: Right;   UPPER GASTROINTESTINAL ENDOSCOPY  AUG 2010   SAVARY   VESICOVAGINAL FISTULA CLOSURE W/ TAH  1966   bleeding     Social History   Socioeconomic History   Marital status: Widowed    Spouse name: Not on file   Number of children: Not on file   Years of  education: Not on file   Highest education level: Not on file  Occupational History   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 agent    Occupation: retired   Tobacco Use   Smoking status: Never   Smokeless tobacco: Never  Vaping Use   Vaping status: Never Used  Substance and Sexual Activity   Alcohol use: No   Drug use: No   Sexual activity: Not Currently  Other Topics Concern   Not on file  Social History Narrative   Long term resident of [facility]    Social Drivers of Health   Financial Resource Strain: Low Risk  (07/14/2018)   Overall Financial Resource Strain (CARDIA)    Difficulty of Paying Living Expenses: Not hard at all  Food Insecurity: No Food Insecurity (07/14/2018)   Hunger Vital Sign    Worried About Running Out of Food in the Last Year: Never true    Ran Out of Food in the Last Year: Never true  Transportation Needs: No Transportation Needs (07/14/2018)   PRAPARE - Administrator, Civil Service (Medical): No    Lack of Transportation (Non-Medical): No  Physical Activity: Insufficiently Active (07/14/2018)   Exercise Vital Sign    Days of Exercise per Week: 4 days    Minutes of Exercise per Session: 20 min  Stress: Stress Concern Present (07/14/2018)   [employer] of Occupational Health - Occupational  Stress Questionnaire    Feeling of Stress : To some extent  Social Connections: Moderately Isolated (07/14/2018)   Social Connection and Isolation Panel [NHANES]    Frequency of Communication with Friends and Family: Twice a week    Frequency of Social Gatherings with Friends and Family: Once a week    Attends Religious Services: Never    Database administrator or Organizations: No    Attends Banker Meetings: Never    Marital Status: Widowed  Intimate Partner Violence: Not At Risk (07/14/2018)   Humiliation, Afraid, Rape, and Kick questionnaire    Fear of Current or Ex-Partner: No    Emotionally Abused: No    Physically Abused:  No    Sexually Abused: No   Family History  Problem Relation Age of Onset   Cancer Mother        bladder    Diabetes Mother    Diabetes Father    Emphysema Father    GER disease Brother    Colon cancer Neg Hx    Colon polyps Neg Hx       VITAL SIGNS BP 116/73   Pulse 64   Temp (!) 97 F (36.1 C)   Resp 20   Ht 5\' 8"  (1.727 m)   Wt 206 lb 12.8 oz (93.8 kg)   SpO2 96%   BMI 31.44 kg/m   Outpatient Encounter Medications as of 12/05/2023  Medication Sig   Acetaminophen (TYLENOL ARTHRITIS PAIN PO) Take 650 mg by mouth in the morning and at bedtime.   aspirin 81 MG chewable tablet Chew 81 mg by mouth daily.   bisacodyl (DULCOLAX) 10 MG suppository Place 10 mg rectally as needed.   brexpiprazole (REXULTI) 0.25 MG TABS tablet Take 0.25 mg by mouth daily at 2 PM.   Carbidopa-Levodopa ER (SINEMET CR) 25-100 MG tablet controlled release Take 1 tablet by mouth 3 (three) times daily.   FLUoxetine (PROZAC) 40 MG capsule Take 40 mg by mouth daily.   levETIRAcetam (KEPPRA) 250 MG tablet Take 250 mg by mouth daily.   Melatonin 5 MG TABS Take 5 mg by mouth at bedtime.   potassium chloride (KLOR-CON) 10 MEQ tablet Take 10 mEq by mouth daily.   Salicylic Acid 40 % MISC Apply 1 patch topically at bedtime. 8 pm   sennosides-docusate sodium (SENOKOT-S) 8.6-50 MG tablet Take 1 tablet by mouth in the morning and at bedtime. For Constipation   Balsam [place]-Castor Oil (VENELEX) OINT Apply topically. Apply to sacrum, bilateral buttocks and coccyx qshift for MAD & excoriation.   fluticasone-salmeterol (ADVAIR) 100-50 MCG/ACT AEPB Inhale 1 puff into the lungs 2 (two) times daily. Special Instructions: RINSE MOUTH AFTER USE. DISCARD AFTER 1 MONTH (Patient not taking: Reported on 12/05/2023)   levETIRAcetam (KEPPRA) 500 MG tablet Take 500 mg by mouth at bedtime. 8 pm   NON FORMULARY Diet type: Regular   No facility-administered encounter medications on file as of 12/05/2023.     SIGNIFICANT DIAGNOSTIC  EXAMS   LABS REVIEWED PREVIOUS  12-11-22: wbc 4.5; hgb 13.8; hct 43.3; mcv 93.5 plt 189; glucose 75 bun 18; creat 0.69; k+ 3.1; na++ 141; ca 9.1; protein 7.4 albumin 4.0; tsh 2.415; vitamin B 12: 412  keppra 8.1  12-17-22: glucose 77; bun 19; creat 0.69; k+ 3.2; na++ 139; ca 8.0; gfr >60 12-24-22: glucose 77; bun 22; creat 0.75; k+ 3.7; na++ 140; ca 8.1; gfr >60 12-27-22: wbc 4.1; hgb 11.9; hct 36.9; mcv 92.7 plt 148 keppra 12.9  03-28-23: chol 175; ldl 127; trig 44; hdl 39 06-13-23: wbc 4.4; hgb 13.3; hct 41.7; mcv 93.5 plt 183; glucose 96; bun 15; creat 0.67; k+ 3.7; na++ 139; ca 8.6; gfr >60; protein 7.2 albumin 3.9; tsh 1.920; vitamin B 12: 474    10-04-23: glucose 82; bun 21; creat 0.52; k+ 3.8; na++ 137; ca 8.5; gfr >60 10-21-23: wbc 4.5; hgb 12.6; hct 38.8; mcv 92.2 plt 178; glucose 79; bun 22; creat 0.55; k+ 4.1; na++ 137; ca 8.4 gfr >60 protein 6.5 albumin 3.4 tsh 1.873; keppra 8.6    NO NEW LABS.   Review of Systems  Unable to perform ROS: Dementia   Physical Exam Constitutional:      General: She is not in acute distress.    Appearance: She is well-developed. She is obese. She is not diaphoretic.  Neck:     Thyroid: No thyromegaly.  Cardiovascular:     Rate and Rhythm: Normal rate and regular rhythm.     Pulses: Normal pulses.     Heart sounds: Normal heart sounds.  Pulmonary:     Effort: Pulmonary effort is normal. No respiratory distress.     Breath sounds: Normal breath sounds.  Abdominal:     General: Bowel sounds are normal. There is no distension.     Palpations: Abdomen is soft.     Tenderness: There is no abdominal tenderness.  Musculoskeletal:     Cervical back: Neck supple.     Right lower leg: No edema.     Left lower leg: No edema.     Comments:  Has bilateral lower extremity contractures from chronic wheelchair use    Lymphadenopathy:     Cervical: No cervical adenopathy.  Skin:    General: Skin is warm and dry.  Neurological:     Mental Status: She is  alert. Mental status is at baseline.     Comments: SLUMS 1/30  Psychiatric:        Mood and Affect: Mood normal.    ASSESSMENT/ PLAN:  TODAY  Parkinson's disease without dyskinesia with fluctuating manifestations Seizure disorder Dementia associated with parkinson's disease    Will continue current medications Will continue current plan of care Will continue to monitor her status  Time spent with patient: 40 minutes: medications dietary plan of care.    Synthia Innocent NP [facility]  Contact [phone] Monday through Friday 8am- 5pm  After hours call [phone]

## 2024-01-06 ENCOUNTER — Encounter: Payer: Self-pay | Admitting: Adult Health

## 2024-01-06 ENCOUNTER — Non-Acute Institutional Stay (SKILLED_NURSING_FACILITY): Payer: Self-pay | Admitting: Adult Health

## 2024-01-06 DIAGNOSIS — K5909 Other constipation: Secondary | ICD-10-CM | POA: Diagnosis not present

## 2024-01-06 DIAGNOSIS — J410 Simple chronic bronchitis: Secondary | ICD-10-CM

## 2024-01-06 DIAGNOSIS — I10 Essential (primary) hypertension: Secondary | ICD-10-CM | POA: Diagnosis not present

## 2024-01-06 NOTE — Progress Notes (Unsigned)
 Location:  [facility] Room Number: 120 Place of Service:  SNF (31)   CODE STATUS: dnr   No Known Allergies  Chief Complaint  Patient presents with   Medical Management of Chronic Issues          Simple chronic bronchitis:  Essential hypertension: Chronic constipation:     HPI:  She is a 89 year old long term resident of this facility being seen for the management of her chronic illnesses: Simple chronic bronchitis:  Essential hypertension: Chronic constipation. There are no reports of uncontrolled pain. There are no reports of anxiety or agitation.   Past Medical History:  Diagnosis Date   Diverticulosis OCT 2010 TCS   SIGMOID COLON   Esophageal web 10/08/2008   DILATION 16 MM   Gastritis, Helicobacter pylori 10/08/2008   Rx. ABO for 10 days    GERD (gastroesophageal reflux disease)    Hip fracture, left (HCC)    2016   Hyperlipidemia    Hypertension    Restless legs syndrome     Past Surgical History:  Procedure Laterality Date   ABDOMINAL HYSTERECTOMY     COLONOSCOPY  OCT 2010   TORTUOUS, Sml IH, Tics, MULTIPLE SIMPLE ADENOMAS (<6MM)   HIP ARTHROPLASTY Left 11/19/2014   Procedure: LEFT PARTIAL HIP REPLACEMENT;  Surgeon: Vickki Hearing, MD;  Location: [facility];  Service: Orthopedics;  Laterality: Left;   ORIF HIP FRACTURE  04/05/2012   Procedure: OPEN REDUCTION INTERNAL FIXATION HIP;  Surgeon: Vickki Hearing, MD;  Location: [facility];  Service: Orthopedics;  Laterality: Right;   UPPER GASTROINTESTINAL ENDOSCOPY  AUG 2010   SAVARY   VESICOVAGINAL FISTULA CLOSURE W/ TAH  1966   bleeding     Social History   Socioeconomic History   Marital status: Widowed    Spouse name: Not on file   Number of children: Not on file   Years of education: Not on file   Highest education level: Not on file  Occupational History   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 agent    Occupation: retired   Tobacco Use   Smoking status: Never    Smokeless tobacco: Never  Vaping Use   Vaping status: Never Used  Substance and Sexual Activity   Alcohol use: No   Drug use: No   Sexual activity: Not Currently  Other Topics Concern   Not on file  Social History Narrative   Long term resident of [facility]    Social Drivers of Health   Financial Resource Strain: Low Risk  (07/14/2018)   Overall Financial Resource Strain (CARDIA)    Difficulty of Paying Living Expenses: Not hard at all  Food Insecurity: No Food Insecurity (07/14/2018)   Hunger Vital Sign    Worried About Running Out of Food in the Last Year: Never true    Ran Out of Food in the Last Year: Never true  Transportation Needs: No Transportation Needs (07/14/2018)   PRAPARE - Administrator, Civil Service (Medical): No    Lack of Transportation (Non-Medical): No  Physical Activity: Insufficiently Active (07/14/2018)   Exercise Vital Sign    Days of Exercise per Week: 4 days    Minutes of Exercise per Session: 20 min  Stress: Stress Concern Present (07/14/2018)   [employer] of Occupational Health - Occupational Stress Questionnaire    Feeling of Stress : To some extent  Social Connections: Moderately Isolated (07/14/2018)   Social Connection and Isolation Panel [NHANES]  Frequency of Communication with Friends and Family: Twice a week    Frequency of Social Gatherings with Friends and Family: Once a week    Attends Religious Services: Never    Database administrator or Organizations: No    Attends Banker Meetings: Never    Marital Status: Widowed  Intimate Partner Violence: Not At Risk (07/14/2018)   Humiliation, Afraid, Rape, and Kick questionnaire    Fear of Current or Ex-Partner: No    Emotionally Abused: No    Physically Abused: No    Sexually Abused: No   Family History  Problem Relation Age of Onset   Cancer Mother        bladder    Diabetes Mother    Diabetes Father    Emphysema Father    GER disease Brother    Colon  cancer Neg Hx    Colon polyps Neg Hx       VITAL SIGNS BP 125/71   Pulse (!) 57   Temp 98 F (36.7 C)   Resp 18   Ht 5\' 8"  (1.727 m)   Wt 209 lb 3.2 oz (94.9 kg)   SpO2 97%   BMI 31.81 kg/m   Outpatient Encounter Medications as of 01/06/2024  Medication Sig   Acetaminophen (TYLENOL ARTHRITIS PAIN PO) Take 650 mg by mouth in the morning and at bedtime.   aspirin 81 MG chewable tablet Chew 81 mg by mouth daily.   Balsam [place]-Castor Oil (VENELEX) OINT Apply topically. Apply to sacrum, bilateral buttocks and coccyx qshift for MAD & excoriation.   bisacodyl (DULCOLAX) 10 MG suppository Place 10 mg rectally as needed.   brexpiprazole (REXULTI) 0.25 MG TABS tablet Take 0.25 mg by mouth daily at 2 PM.   Carbidopa-Levodopa ER (SINEMET CR) 25-100 MG tablet controlled release Take 1 tablet by mouth 3 (three) times daily.   FLUoxetine (PROZAC) 40 MG capsule Take 40 mg by mouth daily.   fluticasone-salmeterol (ADVAIR) 100-50 MCG/ACT AEPB Inhale 1 puff into the lungs 2 (two) times daily. Special Instructions: RINSE MOUTH AFTER USE. DISCARD AFTER 1 MONTH (Patient not taking: Reported on 12/05/2023)   levETIRAcetam (KEPPRA) 250 MG tablet Take 250 mg by mouth daily.   levETIRAcetam (KEPPRA) 500 MG tablet Take 500 mg by mouth at bedtime. 8 pm   Melatonin 5 MG TABS Take 5 mg by mouth at bedtime.   NON FORMULARY Diet type: Regular   potassium chloride (KLOR-CON) 10 MEQ tablet Take 10 mEq by mouth daily.   Salicylic Acid 40 % MISC Apply 1 patch topically at bedtime. 8 pm   sennosides-docusate sodium (SENOKOT-S) 8.6-50 MG tablet Take 1 tablet by mouth in the morning and at bedtime. For Constipation   No facility-administered encounter medications on file as of 01/06/2024.     SIGNIFICANT DIAGNOSTIC EXAMS  LABS REVIEWED PREVIOUS  12-11-22: wbc 4.5; hgb 13.8; hct 43.3; mcv 93.5 plt 189; glucose 75 bun 18; creat 0.69; k+ 3.1; na++ 141; ca 9.1; protein 7.4 albumin 4.0; tsh 2.415; vitamin B 12: 412   keppra 8.1  12-17-22: glucose 77; bun 19; creat 0.69; k+ 3.2; na++ 139; ca 8.0; gfr >60 12-24-22: glucose 77; bun 22; creat 0.75; k+ 3.7; na++ 140; ca 8.1; gfr >60 12-27-22: wbc 4.1; hgb 11.9; hct 36.9; mcv 92.7 plt 148 keppra 12.9  03-28-23: chol 175; ldl 127; trig 44; hdl 39 06-13-23: wbc 4.4; hgb 13.3; hct 41.7; mcv 93.5 plt 183; glucose 96; bun 15; creat 0.67; k+ 3.7; na++  139; ca 8.6; gfr >60; protein 7.2 albumin 3.9; tsh 1.920; vitamin B 12: 474    10-04-23: glucose 82; bun 21; creat 0.52; k+ 3.8; na++ 137; ca 8.5; gfr >60 10-21-23: wbc 4.5; hgb 12.6; hct 38.8; mcv 92.2 plt 178; glucose 79; bun 22; creat 0.55; k+ 4.1; na++ 137; ca 8.4 gfr >60 protein 6.5 albumin 3.4 tsh 1.873; keppra 8.6    NO NEW LABS.   Review of Systems  Unable to perform ROS: Dementia   Physical Exam Constitutional:      General: She is not in acute distress.    Appearance: She is well-developed and overweight. She is not diaphoretic.  Neck:     Thyroid: No thyromegaly.  Cardiovascular:     Rate and Rhythm: Normal rate and regular rhythm.     Pulses: Normal pulses.     Heart sounds: Normal heart sounds.  Pulmonary:     Effort: Pulmonary effort is normal. No respiratory distress.     Breath sounds: Normal breath sounds.  Abdominal:     General: Bowel sounds are normal. There is no distension.     Palpations: Abdomen is soft.     Tenderness: There is no abdominal tenderness.  Musculoskeletal:     Cervical back: Neck supple.     Right lower leg: No edema.     Left lower leg: No edema.     Comments: Has bilateral lower extremity contractures from chronic wheelchair use   Lymphadenopathy:     Cervical: No cervical adenopathy.  Skin:    General: Skin is warm and dry.  Neurological:     Mental Status: She is alert. Mental status is at baseline.     Comments: SLUMS 1/30   Psychiatric:        Mood and Affect: Mood normal.        ASSESSMENT/ PLAN:  TODAY:   Simple chronic bronchitis: without flares; will  continue advair 100/50 one puff twice daily   2. Essential hypertension: b/p 12/71: is currently off medications  3. Chronic constipation: will continue senna s twice daily   PREVIOUS     4. Hypokalemia: k+ 3.8 will monitor   5. Hallucination and delusion/agitation due to dementia: her mood state is stable will continue rexulti 0.25 mg daily   6. Unspecified hyperlipidemia: ldl 127 is off statin due to advanced age   13. Unspecified obsessive compulsive disorder: will continue prozac 40 mg daily (has failed wean X2)   8. Morbid obesity: BMI 31.81 no significant change  9. Parkinson's disease: without change; will continue sinemet cr 25/100 mg three times daily   10. Chronic generalized pain: will continue tylenol 650 mg twice daily  11. Seizure disorder: (after CHI); no recent activity: will continue keppra 250 mg in the AM and 500 mg in the PM  12. Thrombocytopenia: plt 178  13. Dementia with agitation: weight is 209 pounds: she is off namenda (not providing benefit); is on rexulti 0.25 mg daily for agitation.    Synthia Innocent NP [facility]  call [phone]

## 2024-01-27 ENCOUNTER — Non-Acute Institutional Stay (SKILLED_NURSING_FACILITY): Payer: Self-pay | Admitting: Adult Health

## 2024-01-27 ENCOUNTER — Encounter: Payer: Self-pay | Admitting: Adult Health

## 2024-01-27 DIAGNOSIS — F22 Delusional disorders: Secondary | ICD-10-CM

## 2024-01-27 DIAGNOSIS — E876 Hypokalemia: Secondary | ICD-10-CM

## 2024-01-27 DIAGNOSIS — F03911 Unspecified dementia, unspecified severity, with agitation: Secondary | ICD-10-CM

## 2024-01-27 DIAGNOSIS — E785 Hyperlipidemia, unspecified: Secondary | ICD-10-CM

## 2024-01-27 NOTE — Progress Notes (Addendum)
 Location:  [facility] Room Number: 118 Place of Service:  SNF (31)   CODE STATUS: dnr   No Known Allergies  Chief Complaint  Patient presents with   Medical Management of Chronic Issues     Hypokalemia: Hallucination and delusion/agitation due to dementia: Unspecified hyperlipidemia:      HPI:  She is a 89 y.o. long term resident of this facility being seen for the management of her chronic illnesses:Hypokalemia: Hallucination and delusion/agitation due to dementia: Unspecified hyperlipidemia. There are no reports of uncontrolled pain. Her weight remains stable. Her rexulti is effective in managing delusions and hallucinations. Her agitation is managed as well.    Past Medical History:  Diagnosis Date   Diverticulosis OCT 2010 TCS   SIGMOID COLON   Esophageal web 10/08/2008   DILATION 16 MM   Gastritis, Helicobacter pylori 10/08/2008   Rx. ABO for 10 days    GERD (gastroesophageal reflux disease)    Hip fracture, left (HCC)    2016   Hyperlipidemia    Hypertension    Restless legs syndrome     Past Surgical History:  Procedure Laterality Date   ABDOMINAL HYSTERECTOMY     COLONOSCOPY  OCT 2010   TORTUOUS, Sml IH, Tics, MULTIPLE SIMPLE ADENOMAS (<6MM)   HIP ARTHROPLASTY Left 11/19/2014   Procedure: LEFT PARTIAL HIP REPLACEMENT;  Surgeon: Darrin Emerald, MD;  Location: [facility];  Service: Orthopedics;  Laterality: Left;   ORIF HIP FRACTURE  04/05/2012   Procedure: OPEN REDUCTION INTERNAL FIXATION HIP;  Surgeon: Darrin Emerald, MD;  Location: [facility];  Service: Orthopedics;  Laterality: Right;   UPPER GASTROINTESTINAL ENDOSCOPY  AUG 2010   SAVARY   VESICOVAGINAL FISTULA CLOSURE W/ TAH  1966   bleeding     Social History   Socioeconomic History   Marital status: Widowed    Spouse name: Not on file   Number of children: Not on file   Years of education: Not on file   Highest education level: Not on file  Occupational History    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 agent    Occupation: retired   Tobacco Use   Smoking status: Never   Smokeless tobacco: Never  Vaping Use   Vaping status: Never Used  Substance and Sexual Activity   Alcohol use: No   Drug use: No   Sexual activity: Not Currently  Other Topics Concern   Not on file  Social History Narrative   Long term resident of [facility]    Social Drivers of Health   Financial Resource Strain: Low Risk  (07/14/2018)   Overall Financial Resource Strain (CARDIA)    Difficulty of Paying Living Expenses: Not hard at all  Food Insecurity: No Food Insecurity (07/14/2018)   Hunger Vital Sign    Worried About Running Out of Food in the Last Year: Never true    Ran Out of Food in the Last Year: Never true  Transportation Needs: No Transportation Needs (07/14/2018)   PRAPARE - Administrator, Civil Service (Medical): No    Lack of Transportation (Non-Medical): No  Physical Activity: Insufficiently Active (07/14/2018)   Exercise Vital Sign    Days of Exercise per Week: 4 days    Minutes of Exercise per Session: 20 min  Stress: Stress Concern Present (07/14/2018)   [employer] of Occupational Health - Occupational Stress Questionnaire    Feeling of Stress : To some extent  Social Connections: Moderately Isolated (07/14/2018)  Social Connection and Isolation Panel [NHANES]    Frequency of Communication with Friends and Family: Twice a week    Frequency of Social Gatherings with Friends and Family: Once a week    Attends Religious Services: Never    Database administrator or Organizations: No    Attends Banker Meetings: Never    Marital Status: Widowed  Intimate Partner Violence: Not At Risk (07/14/2018)   Humiliation, Afraid, Rape, and Kick questionnaire    Fear of Current or Ex-Partner: No    Emotionally Abused: No    Physically Abused: No    Sexually Abused: No   Family History  Problem Relation Age of Onset   Cancer  Mother        bladder    Diabetes Mother    Diabetes Father    Emphysema Father    GER disease Brother    Colon cancer Neg Hx    Colon polyps Neg Hx       VITAL SIGNS BP 127/70   Pulse (!) 59   Temp 97.6 F (36.4 C)   Resp 18   Ht 5\' 8"  (1.727 m)   Wt 208 lb 12.8 oz (94.7 kg)   SpO2 99%   BMI 31.75 kg/m   Outpatient Encounter Medications as of 01/27/2024  Medication Sig   Acetaminophen  (TYLENOL  ARTHRITIS PAIN PO) Take 650 mg by mouth in the morning and at bedtime.   aspirin  81 MG chewable tablet Chew 81 mg by mouth daily.   Balsam [place]-Castor Oil (VENELEX) OINT Apply topically. Apply to sacrum, bilateral buttocks and coccyx qshift for MAD & excoriation.   bisacodyl  (DULCOLAX) 10 MG suppository Place 10 mg rectally as needed.   Carbidopa-Levodopa ER (SINEMET CR) 25-100 MG tablet controlled release Take 1 tablet by mouth 3 (three) times daily.   FLUoxetine  (PROZAC ) 40 MG capsule Take 40 mg by mouth daily.   fluticasone-salmeterol (ADVAIR) 100-50 MCG/ACT AEPB Inhale 1 puff into the lungs 2 (two) times daily. Special Instructions: RINSE MOUTH AFTER USE. DISCARD AFTER 1 MONTH (Patient not taking: Reported on 12/05/2023)   levETIRAcetam  (KEPPRA ) 250 MG tablet Take 250 mg by mouth twice daily.   Melatonin 5 MG TABS Take 5 mg by mouth at bedtime.   NON FORMULARY Diet type: Regular   potassium chloride  (KLOR-CON ) 10 MEQ tablet Take 10 mEq by mouth daily.   Salicylic Acid 40 % MISC Apply 1 patch topically at bedtime. 8 pm   sennosides-docusate sodium  (SENOKOT-S) 8.6-50 MG tablet Take 1 tablet by mouth in the morning and at bedtime. For Constipation   No facility-administered encounter medications on file as of 01/27/2024.     SIGNIFICANT DIAGNOSTIC EXAMS  LABS REVIEWED PREVIOUS  03-28-23: chol 175; ldl 127; trig 44; hdl 39 06-13-23: wbc 4.4; hgb 13.3; hct 41.7; mcv 93.5 plt 183; glucose 96; bun 15; creat 0.67; k+ 3.7; na++ 139; ca 8.6; gfr >60; protein 7.2 albumin 3.9; tsh 1.920;  vitamin B 12: 474    10-04-23: glucose 82; bun 21; creat 0.52; k+ 3.8; na++ 137; ca 8.5; gfr >60 10-21-23: wbc 4.5; hgb 12.6; hct 38.8; mcv 92.2 plt 178; glucose 79; bun 22; creat 0.55; k+ 4.1; na++ 137; ca 8.4 gfr >60 protein 6.5 albumin 3.4 tsh 1.873; keppra  8.6    NO NEW LABS.   Review of Systems  Unable to perform ROS: Dementia   Physical Exam Constitutional:      General: She is not in acute distress.    Appearance:  She is well-developed and overweight. She is not diaphoretic.  Neck:     Thyroid : No thyromegaly.  Cardiovascular:     Rate and Rhythm: Normal rate and regular rhythm.     Pulses: Normal pulses.     Heart sounds: Normal heart sounds.  Pulmonary:     Effort: Pulmonary effort is normal. No respiratory distress.     Breath sounds: Normal breath sounds.  Abdominal:     General: Bowel sounds are normal. There is no distension.     Palpations: Abdomen is soft.     Tenderness: There is no abdominal tenderness.  Musculoskeletal:     Cervical back: Neck supple.     Right lower leg: No edema.     Left lower leg: No edema.     Comments: Has bilateral lower extremity contractures from chronic wheelchair use    Lymphadenopathy:     Cervical: No cervical adenopathy.  Skin:    General: Skin is warm and dry.  Neurological:     Mental Status: She is alert. Mental status is at baseline.     Comments:  SLUMS 1/30   Psychiatric:        Mood and Affect: Mood normal.        ASSESSMENT/ PLAN:  TODAY:   Hypokalemia: k+ 3.8; will monitor  2. Hallucination and delusion/agitation due to dementia: her mood state is stable no longer on rexulti  3. Unspecified hyperlipidemia: ldl 127 is off statins due to advanced age.   PREVIOUS    4. Unspecified obsessive compulsive disorder: will continue prozac  40 mg daily (has failed wean X2)   5. Morbid obesity: BMI 31.75 no significant change  6. Parkinson's disease: without change; will continue sinemet cr 25/100 mg three times  daily   7. Chronic generalized pain: will continue tylenol  650 mg twice daily  8. Seizure disorder: (after CHI); no recent activity: will continue keppra  250 mg in the AM and 500 mg in the PM  9. Thrombocytopenia: plt 178  10. Dementia with agitation: weight is 208 pounds: she is off namenda (not providing benefit); is on rexulti 0.25 mg daily for agitation.   11. Simple chronic bronchitis: without flares; will continue advair 100/50 one puff twice daily   12. Essential hypertension: b/p 127/71: is currently off medications  13. Chronic constipation: will continue senna s twice daily    Britt Candle NP [facility]   call [phone]

## 2024-02-03 ENCOUNTER — Encounter: Payer: Self-pay | Admitting: Adult Health

## 2024-02-03 ENCOUNTER — Non-Acute Institutional Stay (SKILLED_NURSING_FACILITY): Payer: Self-pay | Admitting: Adult Health

## 2024-02-03 DIAGNOSIS — F03911 Unspecified dementia, unspecified severity, with agitation: Secondary | ICD-10-CM | POA: Insufficient documentation

## 2024-02-03 DIAGNOSIS — Z0000 Encounter for general adult medical examination without abnormal findings: Secondary | ICD-10-CM | POA: Diagnosis not present

## 2024-02-03 NOTE — Patient Instructions (Signed)
  Alejandra Cruz , Thank you for taking time to come for your Medicare Wellness Visit. I appreciate your ongoing commitment to your health goals. Please review the following plan we discussed and let me know if I can assist you in the future.   These are the goals we discussed:  Goals      Absence of Fall and Fall-Related Injury     Evidence-based guidance:  Assess fall risk using a validated tool when available. Consider balance and gait impairment, muscle weakness, diminished vision or hearing, environmental hazards, presence of urinary or bowel urgency and/or incontinence.  Communicate fall injury risk to interprofessional healthcare team.  Develop a fall prevention plan with the patient and family.  Promote use of personal vision and auditory aids.  Promote reorientation, appropriate sensory stimulation, and routines to decrease risk of fall when changes in mental status are present.  Assess assistance level required for safe and effective self-care; consider referral for home care.  Encourage physical activity, such as performance of self-care at highest level of ability, strength and balance exercise program, and provision of appropriate assistive devices; refer to rehabilitation therapy.  Refer to community-based fall prevention program where available.  If fall occurs, determine the cause and revise fall injury prevention plan.  Regularly review medication contribution to fall risk; consider risk related to polypharmacy and age.  Refer to pharmacist for consultation when concerns about medications are revealed.  Balance adequate pain management with potential for oversedation.  Provide guidance related to environmental modifications.  Consider supplementation with Vitamin D .   Notes:      DIET - INCREASE WATER INTAKE     General - Client will not be readmitted within 30 days ([facility])        This is a list of the screening recommended for you and due dates:  Health Maintenance  Topic  Date Due   Flu Shot  05/08/2024   Medicare Annual Wellness Visit  02/02/2025   DTaP/Tdap/Td vaccine (3 - Td or Tdap) 06/21/2027   Pneumonia Vaccine  Completed   DEXA scan (bone density measurement)  Completed   Zoster (Shingles) Vaccine  Completed   HPV Vaccine  Aged Out   Meningitis B Vaccine  Aged Out   COVID-19 Vaccine  Discontinued

## 2024-02-03 NOTE — Progress Notes (Signed)
 Subjective:   Alejandra Cruz is a 89 y.o. female who presents for Medicare Annual (Subsequent) preventive examination.  Visit Complete: In person  Patient Medicare AWV questionnaire was completed by the patient on 02-03-24; I have confirmed that all information answered by patient is correct and no changes since this date.  Cardiac Risk Factors include: advanced age (>49men, >22 women);hypertension;obesity (BMI >30kg/m2);sedentary lifestyle     Objective:    Today's Vitals   02/03/24 1502  BP: 126/80  Pulse: 84  Resp: 18  Temp: (!) 97.2 F (36.2 C)  SpO2: 98%  Weight: 208 lb 12.8 oz (94.7 kg)  Height: 5\' 8"  (1.727 m)   Body mass index is 31.75 kg/m.     12/05/2023   11:02 AM 06/28/2023    8:27 AM 06/12/2023   12:01 PM 03/27/2023    9:30 AM 02/14/2023    2:28 PM 01/31/2023   10:23 AM 01/23/2023    8:36 AM  Advanced Directives  Does Patient Have a Medical Advance Directive? Yes Yes Yes Yes Yes Yes Yes  Type of Advance Directive Out of facility DNR (pink MOST or yellow form) Out of facility DNR (pink MOST or yellow form) Out of facility DNR (pink MOST or yellow form) Out of facility DNR (pink MOST or yellow form) Out of facility DNR (pink MOST or yellow form) Out of facility DNR (pink MOST or yellow form) Out of facility DNR (pink MOST or yellow form)  Does patient want to make changes to medical advance directive? No - Patient declined Yes (Inpatient - patient defers changing a medical advance directive at this time - Information given) No - Patient declined No - Patient declined No - Patient declined No - Patient declined No - Patient declined  Pre-existing out of facility DNR order (yellow form or pink MOST form) Yellow form placed in chart (order not valid for inpatient use)  Yellow form placed in chart (order not valid for inpatient use);Pink MOST form placed in chart (order not valid for inpatient use) Pink MOST form placed in chart (order not valid for inpatient use);Yellow form  placed in chart (order not valid for inpatient use) Pink MOST form placed in chart (order not valid for inpatient use);Yellow form placed in chart (order not valid for inpatient use) Pink MOST form placed in chart (order not valid for inpatient use);Yellow form placed in chart (order not valid for inpatient use) Pink MOST form placed in chart (order not valid for inpatient use);Yellow form placed in chart (order not valid for inpatient use)    Current Medications (verified) Outpatient Encounter Medications as of 02/03/2024  Medication Sig   Acetaminophen  (TYLENOL  ARTHRITIS PAIN PO) Take 650 mg by mouth in the morning and at bedtime.   aspirin  81 MG chewable tablet Chew 81 mg by mouth daily.   Balsam [place]-Castor Oil (VENELEX) OINT Apply topically. Apply to sacrum, bilateral buttocks and coccyx qshift for MAD & excoriation.   bisacodyl  (DULCOLAX) 10 MG suppository Place 10 mg rectally as needed.   brexpiprazole (REXULTI) 0.25 MG TABS tablet Take 0.25 mg by mouth daily at 2 PM.   Carbidopa-Levodopa ER (SINEMET CR) 25-100 MG tablet controlled release Take 1 tablet by mouth 3 (three) times daily.   FLUoxetine  (PROZAC ) 40 MG capsule Take 40 mg by mouth daily.   levETIRAcetam  (KEPPRA ) 250 MG tablet Take 250 mg by mouth daily.   levETIRAcetam  (KEPPRA ) 500 MG tablet Take 500 mg by mouth at bedtime. 8 pm   Melatonin  5 MG TABS Take 5 mg by mouth at bedtime.   NON FORMULARY Diet type: Regular   potassium chloride  (KLOR-CON ) 10 MEQ tablet Take 10 mEq by mouth daily.   Salicylic Acid 40 % MISC Apply 1 patch topically at bedtime. 8 pm   sennosides-docusate sodium  (SENOKOT-S) 8.6-50 MG tablet Take 1 tablet by mouth in the morning and at bedtime. For Constipation   [DISCONTINUED] fluticasone-salmeterol (ADVAIR) 100-50 MCG/ACT AEPB Inhale 1 puff into the lungs 2 (two) times daily. Special Instructions: RINSE MOUTH AFTER USE. DISCARD AFTER 1 MONTH (Patient not taking: Reported on 12/05/2023)   No  facility-administered encounter medications on file as of 02/03/2024.    Allergies (verified) Patient has no known allergies.   History: Past Medical History:  Diagnosis Date   Diverticulosis OCT 2010 TCS   SIGMOID COLON   Esophageal web 10/08/2008   DILATION 16 MM   Gastritis, Helicobacter pylori 10/08/2008   Rx. ABO for 10 days    GERD (gastroesophageal reflux disease)    Hip fracture, left (HCC)    2016   Hyperlipidemia    Hypertension    Restless legs syndrome    Past Surgical History:  Procedure Laterality Date   ABDOMINAL HYSTERECTOMY     COLONOSCOPY  OCT 2010   TORTUOUS, Sml IH, Tics, MULTIPLE SIMPLE ADENOMAS (<6MM)   HIP ARTHROPLASTY Left 11/19/2014   Procedure: LEFT PARTIAL HIP REPLACEMENT;  Surgeon: Darrin Emerald, MD;  Location: [facility];  Service: Orthopedics;  Laterality: Left;   ORIF HIP FRACTURE  04/05/2012   Procedure: OPEN REDUCTION INTERNAL FIXATION HIP;  Surgeon: Darrin Emerald, MD;  Location: [facility];  Service: Orthopedics;  Laterality: Right;   UPPER GASTROINTESTINAL ENDOSCOPY  AUG 2010   SAVARY   VESICOVAGINAL FISTULA CLOSURE W/ [facility]  1966   bleeding    Family History  Problem Relation Age of Onset   Cancer Mother        bladder    Diabetes Mother    Diabetes Father    Emphysema Father    GER disease Brother    Colon cancer Neg Hx    Colon polyps Neg Hx    Social History   Socioeconomic History   Marital status: Widowed    Spouse name: Not on file   Number of children: Not on file   Years of education: Not on file   Highest education level: Not on file  Occupational History   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 agent    Occupation: retired   Tobacco Use   Smoking status: Never   Smokeless tobacco: Never  Vaping Use   Vaping status: Never Used  Substance and Sexual Activity   Alcohol use: No   Drug use: No   Sexual activity: Not Currently  Other Topics Concern   Not on file  Social History Narrative   Long term  resident of [facility]    Social Drivers of Health   Financial Resource Strain: Low Risk  (07/14/2018)   Overall Financial Resource Strain (CARDIA)    Difficulty of Paying Living Expenses: Not hard at all  Food Insecurity: No Food Insecurity (07/14/2018)   Hunger Vital Sign    Worried About Running Out of Food in the Last Year: Never true    Ran Out of Food in the Last Year: Never true  Transportation Needs: No Transportation Needs (07/14/2018)   PRAPARE - Administrator, Civil Service (Medical): No    Lack of Transportation (Non-Medical): No  Physical Activity: Insufficiently Active (07/14/2018)   Exercise Vital Sign    Days of Exercise per Week: 4 days    Minutes of Exercise per Session: 20 min  Stress: Stress Concern Present (07/14/2018)   [employer] of Occupational Health - Occupational Stress Questionnaire    Feeling of Stress : To some extent  Social Connections: Moderately Isolated (07/14/2018)   Social Connection and Isolation Panel [NHANES]    Frequency of Communication with Friends and Family: Twice a week    Frequency of Social Gatherings with Friends and Family: Once a week    Attends Religious Services: Never    Database administrator or Organizations: No    Attends Banker Meetings: Never    Marital Status: Widowed    Tobacco Counseling Counseling given: Not Answered   Clinical Intake:  Pre-visit preparation completed: Yes  Pain : No/denies pain     BMI - recorded: 31.75 Nutritional Status: BMI > 30  Obese Nutritional Risks: Unintentional weight loss, Failure to thrive Diabetes: No  How often do you need to have someone help you when you read instructions, pamphlets, or other written materials from your doctor or pharmacy?: 5 - Always  Interpreter Needed?: No  Comments: long term resident of SNF   Activities of Daily Living    02/03/2024    3:05 PM  In your present state of health, do you have any difficulty performing the  following activities:  Hearing? 0  Vision? 0  Difficulty concentrating or making decisions? 1  Walking or climbing stairs? 1  Dressing or bathing? 1  Doing errands, shopping? 1  Preparing Food and eating ? Y  Using the Toilet? Y  In the past six months, have you accidently leaked urine? Y  Do you have problems with loss of bowel control? Y  Managing your Medications? Y  Managing your Finances? Y  Housekeeping or managing your Housekeeping? Y    Patient Care Team: Marilyne Shu, NP as PCP - General (Geriatric Medicine) Alyce Jubilee, MD (Inactive) (Gastroenterology) [facility] Vinetta Greening, DO as Consulting Physician (Internal Medicine)  Indicate any recent Medical Services you may have received from other than [facility] providers in the past year (date may be approximate).     Assessment:   This is a routine wellness examination for Jazzmon.  Hearing/Vision screen No results found.   Goals Addressed             This Visit's Progress    Absence of Fall and Fall-Related Injury   On track    Evidence-based guidance:  Assess fall risk using a validated tool when available. Consider balance and gait impairment, muscle weakness, diminished vision or hearing, environmental hazards, presence of urinary or bowel urgency and/or incontinence.  Communicate fall injury risk to interprofessional healthcare team.  Develop a fall prevention plan with the patient and family.  Promote use of personal vision and auditory aids.  Promote reorientation, appropriate sensory stimulation, and routines to decrease risk of fall when changes in mental status are present.  Assess assistance level required for safe and effective self-care; consider referral for home care.  Encourage physical activity, such as performance of self-care at highest level of ability, strength and balance exercise program, and provision of appropriate assistive devices; refer to  rehabilitation therapy.  Refer to community-based fall prevention program where available.  If fall occurs, determine the cause and revise fall injury prevention plan.  Regularly review medication contribution  to fall risk; consider risk related to polypharmacy and age.  Refer to pharmacist for consultation when concerns about medications are revealed.  Balance adequate pain management with potential for oversedation.  Provide guidance related to environmental modifications.  Consider supplementation with Vitamin D .   Notes:      DIET - INCREASE WATER INTAKE   On track    General - Client will not be readmitted within 30 days ([facility])   On track      Depression Screen    02/03/2024    3:07 PM 11/05/2023   10:53 AM 09/25/2023    1:02 PM 01/31/2023   12:06 PM 12/24/2022    2:48 PM 10/02/2022   11:05 AM 01/29/2022   10:56 AM  PHQ 2/9 Scores  PHQ - 2 Score    0 0 0 0  PHQ- 9 Score    0  0 0  Exception Documentation Other- indicate reason in comment box Other- indicate reason in comment box Other- indicate reason in comment box      Not completed unable to participate unable to participate unable to fully participate        Fall Risk    02/03/2024    3:06 PM 11/05/2023   10:52 AM 01/31/2023   12:06 PM 12/24/2022    2:47 PM 10/02/2022   11:04 AM  Fall Risk   Falls in the past year? 1 1 1  0 1  Number falls in past yr: 0 1 1 0 1  Injury with Fall? 0 0 0 0 0  Risk for fall due to : History of fall(s);Impaired balance/gait;Impaired mobility History of fall(s);Impaired balance/gait;Impaired mobility History of fall(s);Impaired balance/gait No Fall Risks History of fall(s);Impaired balance/gait;Impaired mobility  Follow up    Falls evaluation completed     MEDICARE RISK AT HOME: Medicare Risk at Home Any stairs in or around the home?: Yes If so, are there any without handrails?: No Home free of loose throw rugs in walkways, pet beds, electrical cords, etc?: Yes Adequate lighting in your  home to reduce risk of falls?: Yes Life alert?: No Use of a cane, walker or w/c?: Yes Grab bars in the bathroom?: Yes Shower chair or bench in shower?: Yes Elevated toilet seat or a handicapped toilet?: Yes  TIMED UP AND GO:  Was the test performed?  No    Cognitive Function:    02/03/2024    3:07 PM 01/31/2023   12:09 PM 01/29/2022   10:57 AM 01/24/2021   11:09 AM 04/29/2014   10:45 AM  MMSE - Mini Mental State Exam  Not completed: Unable to complete Unable to complete Refused Refused   Orientation to time     5  Orientation to Place     5  Registration     3  Attention/ Calculation     5  Recall     2  Language- name 2 objects     2  Language- repeat     1  Language- follow 3 step command     3  Language- read & follow direction     1  Write a sentence     1  Copy design     0  Total score     28        01/22/2020   11:52 AM 07/14/2018   11:03 AM 07/04/2017    8:45 AM  6CIT Screen  What Year? 0 points 0 points 0 points  What month? 0 points  0 points 0 points  What time? 0 points 0 points 0 points  Count back from 20 2 points 0 points 0 points  Months in reverse 2 points 0 points 0 points  Repeat phrase 2 points 4 points 4 points  Total Score 6 points 4 points 4 points    Immunizations Immunization History  Administered Date(s) Administered   Fluad Quad(high Dose 65+) 07/12/2021, 08/05/2023   Influenza Split 07/25/2012   Influenza,inj,Quad PF,6+ Mos 07/30/2013, 08/18/2014   Influenza-Unspecified 07/12/2017, 07/11/2018, 07/13/2019, 07/15/2020, 07/10/2022   Moderna Covid-19 Vaccine Bivalent Booster 60yrs & up 08/01/2021, 07/16/2023   Moderna SARS-COV2 Booster Vaccination 02/27/2022   Moderna Sars-Covid-2 Vaccination 10/14/2019, 11/11/2019, 08/11/2020, 01/18/2021, 08/01/2022, 01/07/2024   PNEUMOCOCCAL CONJUGATE-20 03/23/2022   PPD Test 12/06/2014   Pneumococcal Conjugate-13 04/29/2014   Pneumococcal Polysaccharide-23 07/25/2012   Rsv, Bivalent, Protein Subunit  Rsvpref,pf Pattricia Bores) 09/10/2022   Tdap 08/08/2011, 06/20/2017   Zoster Recombinant(Shingrix) 06/06/2021, 09/08/2021    TDAP status: Up to date  Flu Vaccine status: Up to date  Pneumococcal vaccine status: Up to date  Covid-19 vaccine status: Completed vaccines  Qualifies for Shingles Vaccine? No   Zostavax completed Yes     Screening Tests Health Maintenance  Topic Date Due   INFLUENZA VACCINE  05/08/2024   Medicare Annual Wellness (AWV)  02/02/2025   DTaP/Tdap/Td (3 - Td or Tdap) 06/21/2027   Pneumonia Vaccine 76+ Years old  Completed   DEXA SCAN  Completed   Zoster Vaccines- Shingrix  Completed   HPV VACCINES  Aged Out   Meningococcal B Vaccine  Aged Out   COVID-19 Vaccine  Discontinued    Health Maintenance  There are no preventive care reminders to display for this patient.   Colorectal cancer screening: No longer required.   Mammogram status: No longer required due to age.  Bone Density status: Completed 2023. Results reflect: Bone density results: OSTEOPOROSIS. Repeat every 2 years.  Lung Cancer Screening: (Low Dose CT Chest recommended if Age 63-80 years, 20 pack-year currently smoking OR have quit w/in 15years.) does not qualify.   Lung Cancer Screening Referral:   Additional Screening:  Hepatitis C Screening: does not qualify; Completed   Vision Screening: Recommended annual ophthalmology exams for early detection of glaucoma and other disorders of the eye. Is the patient up to date with their annual eye exam?  No  Who is the provider or what is the name of the office in which the patient attends annual eye exams?  If pt is not established with a provider, would they like to be referred to a provider to establish care? No .   Dental Screening: Recommended annual dental exams for proper oral hygiene  Diabetic Foot Exam:   Community Resource Referral / Chronic Care Management: CRR required this visit?  No   CCM required this visit?  No      Plan:     I have personally reviewed and noted the following in the patient's chart:   Medical and social history Use of alcohol, tobacco or illicit drugs  Current medications and supplements including opioid prescriptions. Patient is not currently taking opioid prescriptions. Functional ability and status Nutritional status Physical activity Advanced directives List of other physicians Hospitalizations, surgeries, and ER visits in previous 12 months Vitals Screenings to include cognitive, depression, and falls Referrals and appointments  In addition, I have reviewed and discussed with patient certain preventive protocols, quality metrics, and best practice recommendations. A written personalized care plan for preventive services as well  as general preventive health recommendations were provided to patient.     Marilyne Shu, NP   02/03/2024   After Visit Summary: (In Person-Printed) AVS printed and given to the patient  Nurse Notes: this exam was performed at this facility by myself.

## 2024-02-20 ENCOUNTER — Non-Acute Institutional Stay (SKILLED_NURSING_FACILITY): Payer: Self-pay | Admitting: Internal Medicine

## 2024-02-20 ENCOUNTER — Encounter: Payer: Self-pay | Admitting: Internal Medicine

## 2024-02-20 DIAGNOSIS — G40909 Epilepsy, unspecified, not intractable, without status epilepticus: Secondary | ICD-10-CM | POA: Diagnosis not present

## 2024-02-20 DIAGNOSIS — F01C11 Vascular dementia, severe, with agitation: Secondary | ICD-10-CM | POA: Insufficient documentation

## 2024-02-20 DIAGNOSIS — D649 Anemia, unspecified: Secondary | ICD-10-CM | POA: Diagnosis not present

## 2024-02-20 DIAGNOSIS — I10 Essential (primary) hypertension: Secondary | ICD-10-CM

## 2024-02-20 NOTE — Assessment & Plan Note (Addendum)
 Staff reports that she is not exhibiting agitation; but some days she is difficult to arouse.  No seizure activity reported. No change in psychotropic meds.

## 2024-02-20 NOTE — Assessment & Plan Note (Signed)
 Most recent CBC was normal with no evidence of anemia.  No bleeding dyscrasias reported by staff.

## 2024-02-20 NOTE — Patient Instructions (Addendum)
 See assessment and plan under each diagnosis in the problem list and acutely for this visit:  Severe vascular dementia with agitation [facility]) Staff reports that she is not exhibiting agitation; but some days she is difficult to arouse.  No seizure activity reported. No change in psychotropic meds.  Essential hypertension Blood pressure is well-controlled without antihypertensive medications.  Continue to monitor.  Seizure disorder [facility]) Staff reports no seizure activity on present Keppra  regimen.  Continue to monitor.  Anemia, normocytic normochromic Most recent CBC was normal with no evidence of anemia.  No bleeding dyscrasias reported by staff.

## 2024-02-20 NOTE — Progress Notes (Signed)
   NURSING HOME LOCATION:  [facility] ROOM NUMBER: [id number]  CODE STATUS: DNR  PCP: Marilyne Shu, NP   This is a nursing facility follow up visit of chronic medical diagnoses to document compliance with Regulation 483.30 (c) in The Long Term Care Survey Manual Phase 2 which mandates caregiver visit ( visits can alternate among physician, PA or NP as per statutes) within 10 days of 30 days / 60 days/ 90 days post admission to SNF date  .  Interim medical record and care since last SNF visit was updated with review of diagnostic studies and change in clinical status since last visit were documented.  HPI: She is a permanent resident of this facility with medical diagnoses of RLS, history of seizure disorder, essential hypertension, history of diverticulosis, Parkinson's disease without dyskinesia, GERD with esophageal web, past history of Helicobacter pylori gastritis, dementia, and dyslipidemia.  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  Most recent labs were completed 10/21/2023.  There was minimal progression of her hypocalcemia with the value dropping from 8.5-8.4.  Albumin had dropped from 3.9-3.4; but total protein was low normal at 6.5.  CBC was normal.  TSH was therapeutic.  She employs a wheelchair; yet she still has had a total of 5 falls without documented injury.  She requires dependent assist with all ADLs.  She is incontinent of bladder and intermittently incontinent of bowel.  No seizures reported by staff.  Staff reports no behavioral issues although some days she is very somnolent & difficult to arouse.    Review of systems: Dementia invalidated responses.  She is essentially nonverbal.  She exhibits very garbled, "baby talk" type speech pattern which is difficult to discern.   Physical exam:  Pertinent or positive findings: Facies are blank.  She is markedly hard of hearing.  Lids are puffy.  She is  edentulous.  She exhibits an intermittent mastication type activity of the mandible.  She could not follow commands to allow optimal auscultation of lungs. Abdomen is protuberant.  She has 1/2+ edema at the right sock line and trace at the left.  Pedal pulses are decreased to palpation.  She would not oppose my hands to assess strength.  General appearance: Adequately nourished; no acute distress, increased work of breathing is present.   Lymphatic: No lymphadenopathy about the head, neck, axilla. Eyes: No conjunctival inflammation or lid edema is present. There is no scleral icterus. Ears:  External ear exam shows no significant lesions or deformities.   Nose:  External nasal examination shows no deformity or inflammation. Nasal mucosa are pink and moist without lesions, exudates Neck:  No thyromegaly, masses, tenderness noted.    Heart:  Normal rate and regular rhythm. S1 and S2 normal without gallop, murmur, click, rub .  Lungs:  without wheezes, rhonchi, rales, rubs. Abdomen: Bowel sounds are normal. Abdomen is soft and nontender with no organomegaly, hernias, masses. GU: Deferred  Extremities:  No cyanosis, clubbing Neurologic exam :Balance, Rhomberg, finger to nose testing could not be completed due to clinical state Skin: Warm & dry w/o tenting. No significant lesions or rash.  See summary under each active problem in the Problem List with associated updated therapeutic plan

## 2024-02-20 NOTE — Assessment & Plan Note (Signed)
 Blood pressure is well-controlled without antihypertensive medications.  Continue to monitor.

## 2024-02-20 NOTE — Assessment & Plan Note (Addendum)
 Staff reports no seizure activity on present Keppra  regimen.  Continue to monitor.

## 2024-02-26 NOTE — Addendum Note (Signed)
 Addended by: Marilyne Shu on: 02/26/2024 09:59 AM   Modules accepted: Orders

## 2024-02-26 NOTE — Addendum Note (Signed)
 Addended by: Marilyne Shu on: 02/26/2024 09:56 AM   Modules accepted: Orders

## 2024-03-05 ENCOUNTER — Encounter: Payer: Self-pay | Admitting: Adult Health

## 2024-03-05 NOTE — Progress Notes (Unsigned)
 Location:  [facility] Room Number: 112 Place of Service:  SNF (31)   CODE STATUS: dnr   No Known Allergies  Chief Complaint  Patient presents with   Acute Visit    Care plan meeting     HPI:  We have come together for her care plan of care. *** BIMS*** mood ***. She uses wheelchair with *** falls. She requires **. She is ***. Dietary: ***. Therapy: ***. She will continue to be followed for her chronic illnesses including: Simple chronic bronchitis  Seizure disorder  Parkinson's disease without dyskinesia with fluctuating manifestations  Past Medical History:  Diagnosis Date   Diverticulosis OCT 2010 TCS   SIGMOID COLON   Esophageal web 10/08/2008   DILATION 16 MM   Gastritis, Helicobacter pylori 10/08/2008   Rx. ABO for 10 days    GERD (gastroesophageal reflux disease)    Hip fracture, left (HCC)    2016   Hyperlipidemia    Hypertension    Restless legs syndrome     Past Surgical History:  Procedure Laterality Date   ABDOMINAL HYSTERECTOMY     COLONOSCOPY  OCT 2010   TORTUOUS, Sml IH, Tics, MULTIPLE SIMPLE ADENOMAS (<6MM)   HIP ARTHROPLASTY Left 11/19/2014   Procedure: LEFT PARTIAL HIP REPLACEMENT;  Surgeon: Darrin Emerald, MD;  Location: [facility];  Service: Orthopedics;  Laterality: Left;   ORIF HIP FRACTURE  04/05/2012   Procedure: OPEN REDUCTION INTERNAL FIXATION HIP;  Surgeon: Darrin Emerald, MD;  Location: [facility];  Service: Orthopedics;  Laterality: Right;   UPPER GASTROINTESTINAL ENDOSCOPY  AUG 2010   SAVARY   VESICOVAGINAL FISTULA CLOSURE W/ TAH  1966   bleeding     Social History   Socioeconomic History   Marital status: Widowed    Spouse name: Not on file   Number of children: Not on file   Years of education: Not on file   Highest education level: Not on file  Occupational History   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 agent    Occupation: retired   Tobacco Use   Smoking status: Never   Smokeless  tobacco: Never  Vaping Use   Vaping status: Never Used  Substance and Sexual Activity   Alcohol use: No   Drug use: No   Sexual activity: Not Currently  Other Topics Concern   Not on file  Social History Narrative   [facility] term resident of [facility]    Social Drivers of Health   Financial Resource Strain: Low Risk  (07/14/2018)   Overall Financial Resource Strain (CARDIA)    Difficulty of Paying Living Expenses: Not hard at all  Food Insecurity: No Food Insecurity (07/14/2018)   Hunger Vital Sign    Worried About Running Out of Food in the Last Year: Never true    Ran Out of Food in the Last Year: Never true  Transportation Needs: No Transportation Needs (07/14/2018)   PRAPARE - Administrator, Civil Service (Medical): No    Lack of Transportation (Non-Medical): No  Physical Activity: Insufficiently Active (07/14/2018)   Exercise Vital Sign    Days of Exercise per Week: 4 days    Minutes of Exercise per Session: 20 min  Stress: Stress Concern Present (07/14/2018)   [employer] of Occupational Health - Occupational Stress Questionnaire    Feeling of Stress : To some extent  Social Connections: Moderately Isolated (07/14/2018)   Social Connection and Isolation Panel [NHANES]    Frequency of Communication  with Friends and Family: Twice a week    Frequency of Social Gatherings with Friends and Family: Once a week    Attends Religious Services: Never    Database administrator or Organizations: No    Attends Banker Meetings: Never    Marital Status: Widowed  Intimate Partner Violence: Not At Risk (07/14/2018)   Humiliation, Afraid, Rape, and Kick questionnaire    Fear of Current or Ex-Partner: No    Emotionally Abused: No    Physically Abused: No    Sexually Abused: No   Family History  Problem Relation Age of Onset   Cancer Mother        bladder    Diabetes Mother    Diabetes Father    Emphysema Father    GER disease Brother    Colon cancer Neg Hx     Colon polyps Neg Hx       VITAL SIGNS BP 110/72   Pulse 78   Temp 98 F (36.7 C)   Resp 18   Ht 5\' 8"  (1.727 m)   Wt 209 lb 6.4 oz (95 kg)   SpO2 96%   BMI 31.84 kg/m   Outpatient Encounter Medications as of 03/06/2024  Medication Sig   Acetaminophen  (TYLENOL  ARTHRITIS PAIN PO) Take 650 mg by mouth in the morning and at bedtime.   aspirin  81 MG chewable tablet Chew 81 mg by mouth daily.   Balsam Peru-Castor Oil (VENELEX) OINT Apply topically. Apply to sacrum, bilateral buttocks and coccyx qshift for MAD & excoriation.   bisacodyl  (DULCOLAX) 10 MG suppository Place 10 mg rectally as needed.   Carbidopa-Levodopa ER (SINEMET CR) 25-100 MG tablet controlled release Take 1 tablet by mouth 3 (three) times daily.   FLUoxetine  (PROZAC ) 40 MG capsule Take 40 mg by mouth daily.   levETIRAcetam  (KEPPRA ) 250 MG tablet Take 250 mg by mouth 2 (two) times daily.   Melatonin 5 MG TABS Take 5 mg by mouth at bedtime.   NON FORMULARY Diet type: Regular   potassium chloride  (KLOR-CON ) 10 MEQ tablet Take 10 mEq by mouth daily.   Salicylic Acid 40 % MISC Apply 1 patch topically at bedtime. 8 pm   sennosides-docusate sodium  (SENOKOT-S) 8.6-50 MG tablet Take 1 tablet by mouth in the morning and at bedtime. For Constipation   No facility-administered encounter medications on file as of 03/06/2024.     SIGNIFICANT DIAGNOSTIC EXAMS  LABS REVIEWED PREVIOUS  03-28-23: chol 175; ldl 127; trig 44; hdl 39 06-13-23: wbc 4.4; hgb 13.3; hct 41.7; mcv 93.5 plt 183; glucose 96; bun 15; creat 0.67; k+ 3.7; na++ 139; ca 8.6; gfr >60; protein 7.2 albumin 3.9; tsh 1.920; vitamin B 12: 474    10-04-23: glucose 82; bun 21; creat 0.52; k+ 3.8; na++ 137; ca 8.5; gfr >60 10-21-23: wbc 4.5; hgb 12.6; hct 38.8; mcv 92.2 plt 178; glucose 79; bun 22; creat 0.55; k+ 4.1; na++ 137; ca 8.4 gfr >60 protein 6.5 albumin 3.4 tsh 1.873; keppra  8.6    NO NEW LABS.   Review of Systems  Unable to perform ROS: Dementia   Physical  Exam Constitutional:      General: She is not in acute distress.    Appearance: She is well-developed. She is obese. She is not diaphoretic.  Neck:     Thyroid : No thyromegaly.  Cardiovascular:     Rate and Rhythm: Normal rate and regular rhythm.     Pulses: Normal pulses.  Heart sounds: Normal heart sounds.  Pulmonary:     Effort: Pulmonary effort is normal. No respiratory distress.     Breath sounds: Normal breath sounds.  Abdominal:     General: Bowel sounds are normal. There is no distension.     Palpations: Abdomen is soft.     Tenderness: There is no abdominal tenderness.  Musculoskeletal:     Cervical back: Neck supple.     Right lower leg: No edema.     Left lower leg: No edema.     Comments: Has bilateral lower extremity contractures from chronic wheelchair use      Lymphadenopathy:     Cervical: No cervical adenopathy.  Skin:    General: Skin is warm and dry.  Neurological:     Mental Status: She is alert. Mental status is at baseline.  Psychiatric:        Mood and Affect: Mood normal.    ASSESSMENT/ PLAN:  TODAY  Simple chronic bronchitis Seizure disorder Parkinson's disease without dyskinesia with fluctuating manifestations   Will continue current medications Will continue current plan of care Will continue to monitor her status  Time spent with patient: 40 minutes: dietary; plan of care medications.    Britt Candle NP [facility]   call [phone]

## 2024-03-06 ENCOUNTER — Non-Acute Institutional Stay (SKILLED_NURSING_FACILITY): Payer: Self-pay | Admitting: Adult Health

## 2024-03-06 DIAGNOSIS — G20A2 Parkinson's disease without dyskinesia, with fluctuations: Secondary | ICD-10-CM

## 2024-03-06 DIAGNOSIS — J410 Simple chronic bronchitis: Secondary | ICD-10-CM | POA: Diagnosis not present

## 2024-03-06 DIAGNOSIS — G40909 Epilepsy, unspecified, not intractable, without status epilepticus: Secondary | ICD-10-CM | POA: Diagnosis not present

## 2024-03-24 ENCOUNTER — Non-Acute Institutional Stay (SKILLED_NURSING_FACILITY): Payer: Self-pay | Admitting: Adult Health

## 2024-03-24 ENCOUNTER — Encounter: Payer: Self-pay | Admitting: Adult Health

## 2024-03-24 DIAGNOSIS — G20A2 Parkinson's disease without dyskinesia, with fluctuations: Secondary | ICD-10-CM

## 2024-03-24 DIAGNOSIS — F422 Mixed obsessional thoughts and acts: Secondary | ICD-10-CM

## 2024-03-24 NOTE — Progress Notes (Signed)
 Location:  [facility] Room Number: 111 Place of Service:  SNF (31)   CODE STATUS: dnr   No Known Allergies  Chief Complaint  Patient presents with   Medical Management of Chronic Issues          Unspecified obsessive compulsive disorder:  Morbid obesity:  Parkinson's disease    HPI:  She is a 89 y.o. long term resident of this facility being seen for the management of her chronic illnesses:Unspecified obsessive compulsive disorder:  Morbid obesity:  Parkinson's disease. There are no reports of uncontrolled pain. There are no reports of anxiety or no reports of significant weight loss.    Past Medical History:  Diagnosis Date   Diverticulosis OCT 2010 TCS   SIGMOID COLON   Esophageal web 10/08/2008   DILATION 16 MM   Gastritis, Helicobacter pylori 10/08/2008   Rx. ABO for 10 days    GERD (gastroesophageal reflux disease)    Hip fracture, left (HCC)    2016   Hyperlipidemia    Hypertension    Restless legs syndrome     Past Surgical History:  Procedure Laterality Date   ABDOMINAL HYSTERECTOMY     COLONOSCOPY  OCT 2010   TORTUOUS, Sml IH, Tics, MULTIPLE SIMPLE ADENOMAS (<6MM)   HIP ARTHROPLASTY Left 11/19/2014   Procedure: LEFT PARTIAL HIP REPLACEMENT;  Surgeon: Darrin Emerald, MD;  Location: [facility];  Service: Orthopedics;  Laterality: Left;   ORIF HIP FRACTURE  04/05/2012   Procedure: OPEN REDUCTION INTERNAL FIXATION HIP;  Surgeon: Darrin Emerald, MD;  Location: [facility];  Service: Orthopedics;  Laterality: Right;   UPPER GASTROINTESTINAL ENDOSCOPY  AUG 2010   SAVARY   VESICOVAGINAL FISTULA CLOSURE W/ TAH  1966   bleeding     Social History   Socioeconomic History   Marital status: Widowed    Spouse name: Not on file   Number of children: Not on file   Years of education: Not on file   Highest education level: Not on file  Occupational History   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 agent    Occupation: retired    Tobacco Use   Smoking status: Never   Smokeless tobacco: Never  Vaping Use   Vaping status: Never Used  Substance and Sexual Activity   Alcohol use: No   Drug use: No   Sexual activity: Not Currently  Other Topics Concern   Not on file  Social History Narrative   Long term resident of [facility]    Social Drivers of Health   Financial Resource Strain: Low Risk  (07/14/2018)   Overall Financial Resource Strain (CARDIA)    Difficulty of Paying Living Expenses: Not hard at all  Food Insecurity: No Food Insecurity (07/14/2018)   Hunger Vital Sign    Worried About Running Out of Food in the Last Year: Never true    Ran Out of Food in the Last Year: Never true  Transportation Needs: No Transportation Needs (07/14/2018)   PRAPARE - Administrator, Civil Service (Medical): No    Lack of Transportation (Non-Medical): No  Physical Activity: Insufficiently Active (07/14/2018)   Exercise Vital Sign    Days of Exercise per Week: 4 days    Minutes of Exercise per Session: 20 min  Stress: Stress Concern Present (07/14/2018)   [employer] of Occupational Health - Occupational Stress Questionnaire    Feeling of Stress : To some extent  Social Connections: Moderately Isolated (07/14/2018)   Social  Connection and Isolation Panel    Frequency of Communication with Friends and Family: Twice a week    Frequency of Social Gatherings with Friends and Family: Once a week    Attends Religious Services: Never    Database administrator or Organizations: No    Attends Banker Meetings: Never    Marital Status: Widowed  Intimate Partner Violence: Not At Risk (07/14/2018)   Humiliation, Afraid, Rape, and Kick questionnaire    Fear of Current or Ex-Partner: No    Emotionally Abused: No    Physically Abused: No    Sexually Abused: No   Family History  Problem Relation Age of Onset   Cancer Mother        bladder    Diabetes Mother    Diabetes Father    Emphysema Father    GER  disease Brother    Colon cancer Neg Hx    Colon polyps Neg Hx       VITAL SIGNS BP 111/64   Pulse (!) 53   Temp (!) 97 F (36.1 C)   Resp 20   Ht 5' 8 (1.727 m)   Wt 208 lb 12.8 oz (94.7 kg)   SpO2 96%   BMI 31.75 kg/m   Outpatient Encounter Medications as of 03/24/2024  Medication Sig   Acetaminophen  (TYLENOL  ARTHRITIS PAIN PO) Take 650 mg by mouth in the morning and at bedtime.   aspirin  81 MG chewable tablet Chew 81 mg by mouth daily.   Balsam Peru-Castor Oil (VENELEX) OINT Apply topically. Apply to sacrum, bilateral buttocks and coccyx qshift for MAD & excoriation.   bisacodyl  (DULCOLAX) 10 MG suppository Place 10 mg rectally as needed.   Carbidopa-Levodopa ER (SINEMET CR) 25-100 MG tablet controlled release Take 1 tablet by mouth 3 (three) times daily.   FLUoxetine  (PROZAC ) 40 MG capsule Take 40 mg by mouth daily.   levETIRAcetam  (KEPPRA ) 250 MG tablet Take 250 mg by mouth 2 (two) times daily.   Melatonin 5 MG TABS Take 5 mg by mouth at bedtime.   NON FORMULARY Diet type: Regular   potassium chloride  (KLOR-CON ) 10 MEQ tablet Take 10 mEq by mouth daily.   Salicylic Acid 40 % MISC Apply 1 patch topically at bedtime. 8 pm   sennosides-docusate sodium  (SENOKOT-S) 8.6-50 MG tablet Take 1 tablet by mouth in the morning and at bedtime. For Constipation   No facility-administered encounter medications on file as of 03/24/2024.     SIGNIFICANT DIAGNOSTIC EXAMS  LABS REVIEWED PREVIOUS  03-28-23: chol 175; ldl 127; trig 44; hdl 39 06-13-23: wbc 4.4; hgb 13.3; hct 41.7; mcv 93.5 plt 183; glucose 96; bun 15; creat 0.67; k+ 3.7; na++ 139; ca 8.6; gfr >60; protein 7.2 albumin 3.9; tsh 1.920; vitamin B 12: 474    10-04-23: glucose 82; bun 21; creat 0.52; k+ 3.8; na++ 137; ca 8.5; gfr >60 10-21-23: wbc 4.5; hgb 12.6; hct 38.8; mcv 92.2 plt 178; glucose 79; bun 22; creat 0.55; k+ 4.1; na++ 137; ca 8.4 gfr >60 protein 6.5 albumin 3.4 tsh 1.873; keppra  8.6    NO NEW LABS.   Review of  Systems  Unable to perform ROS: Dementia   Physical Exam Constitutional:      General: She is not in acute distress.    Appearance: She is well-developed. She is obese. She is not diaphoretic.  Neck:     Thyroid : No thyromegaly.   Cardiovascular:     Rate and Rhythm: Normal rate and  regular rhythm.     Pulses: Normal pulses.     Heart sounds: Normal heart sounds.  Pulmonary:     Effort: Pulmonary effort is normal. No respiratory distress.     Breath sounds: Normal breath sounds.  Abdominal:     General: Bowel sounds are normal. There is no distension.     Palpations: Abdomen is soft.     Tenderness: There is no abdominal tenderness.   Musculoskeletal:     Cervical back: Neck supple.     Right lower leg: No edema.     Left lower leg: No edema.     Comments:  Has bilateral lower extremity contractures from chronic wheelchair use    Lymphadenopathy:     Cervical: No cervical adenopathy.   Skin:    General: Skin is warm and dry.   Neurological:     Mental Status: She is alert. Mental status is at baseline.   Psychiatric:        Mood and Affect: Mood normal.         ASSESSMENT/ PLAN:  TODAY:   Unspecified obsessive compulsive disorder: will continue prozac  40 mg daily (failed wean X 2)   2. Morbid obesity: BMI 31.75 no significant change in weight  3. Parkinson's disease: is without change will continue sinemet cr 25/100 mg three times daily   PREVIOUS    4. Chronic generalized pain: will continue tylenol  650 mg twice daily  5. Seizure disorder: (after CHI); no recent activity: will continue keppra  250 mg in the AM and 500 mg in the PM  6. Thrombocytopenia: plt 178  7. Dementia with agitation: weight is 208 pounds: she is off namenda (not providing benefit); is on rexulti 0.25 mg daily for agitation.   8. Simple chronic bronchitis: without flares; will continue advair 100/50 one puff twice daily   9. Essential hypertension: b/p 111/64: is currently off  medications  10. Chronic constipation: will continue senna s twice daily   11. Hypokalemia: k+ 3.8; will monitor  12. Hallucination and delusion/agitation due to dementia: her mood state is stable no longer on rexulti  13. Unspecified hyperlipidemia: ldl 127 is off statins due to advanced age.      Britt Candle NP [facility]  call [phone]

## 2024-04-22 ENCOUNTER — Non-Acute Institutional Stay (SKILLED_NURSING_FACILITY): Payer: Self-pay | Admitting: Adult Health

## 2024-04-22 ENCOUNTER — Encounter: Payer: Self-pay | Admitting: Adult Health

## 2024-04-22 DIAGNOSIS — D696 Thrombocytopenia, unspecified: Secondary | ICD-10-CM | POA: Diagnosis not present

## 2024-04-22 DIAGNOSIS — G40909 Epilepsy, unspecified, not intractable, without status epilepticus: Secondary | ICD-10-CM | POA: Diagnosis not present

## 2024-04-22 DIAGNOSIS — G894 Chronic pain syndrome: Secondary | ICD-10-CM

## 2024-04-22 NOTE — Progress Notes (Signed)
 Location:  [facility] Room Number: 113 Place of Service:  SNF (31)   CODE STATUS: dnr   No Known Allergies  Chief Complaint  Patient presents with   Medical Management of Chronic Issues              Chronic pain syndrome: Seizure disorder:Thrombocytopenia    HPI:  She is a 89 y.o. long term resident of this facility being seen for the management of her chronic illnesses:Chronic pain syndrome: Seizure disorder:Thrombocytopenia. There are no reports of uncontrolled pain present. She continues to get out of bed daily; there are no reports of anxiety or agitation.    Past Medical History:  Diagnosis Date   Diverticulosis OCT 2010 TCS   SIGMOID COLON   Esophageal web 10/08/2008   DILATION 16 MM   Gastritis, Helicobacter pylori 10/08/2008   Rx. ABO for 10 days    GERD (gastroesophageal reflux disease)    Hip fracture, left (HCC)    2016   Hyperlipidemia    Hypertension    Restless legs syndrome     Past Surgical History:  Procedure Laterality Date   ABDOMINAL HYSTERECTOMY     COLONOSCOPY  OCT 2010   TORTUOUS, Sml IH, Tics, MULTIPLE SIMPLE ADENOMAS (<6MM)   HIP ARTHROPLASTY Left 11/19/2014   Procedure: LEFT PARTIAL HIP REPLACEMENT;  Surgeon: Taft FORBES Minerva, MD;  Location: [facility];  Service: Orthopedics;  Laterality: Left;   ORIF HIP FRACTURE  04/05/2012   Procedure: OPEN REDUCTION INTERNAL FIXATION HIP;  Surgeon: Taft FORBES Minerva, MD;  Location: [facility];  Service: Orthopedics;  Laterality: Right;   UPPER GASTROINTESTINAL ENDOSCOPY  AUG 2010   SAVARY   VESICOVAGINAL FISTULA CLOSURE W/ TAH  1966   bleeding     Social History   Socioeconomic History   Marital status: Widowed    Spouse name: Not on file   Number of children: Not on file   Years of education: Not on file   Highest education level: Not on file  Occupational History   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 agent    Occupation: retired   Tobacco Use   Smoking  status: Never   Smokeless tobacco: Never  Vaping Use   Vaping status: Never Used  Substance and Sexual Activity   Alcohol use: No   Drug use: No   Sexual activity: Not Currently  Other Topics Concern   Not on file  Social History Narrative   Long term resident of [facility]    Social Drivers of Health   Financial Resource Strain: Low Risk  (07/14/2018)   Overall Financial Resource Strain (CARDIA)    Difficulty of Paying Living Expenses: Not hard at all  Food Insecurity: No Food Insecurity (07/14/2018)   Hunger Vital Sign    Worried About Running Out of Food in the Last Year: Never true    Ran Out of Food in the Last Year: Never true  Transportation Needs: No Transportation Needs (07/14/2018)   PRAPARE - Administrator, Civil Service (Medical): No    Lack of Transportation (Non-Medical): No  Physical Activity: Insufficiently Active (07/14/2018)   Exercise Vital Sign    Days of Exercise per Week: 4 days    Minutes of Exercise per Session: 20 min  Stress: Stress Concern Present (07/14/2018)   [employer] of Occupational Health - Occupational Stress Questionnaire    Feeling of Stress : To some extent  Social Connections: Moderately Isolated (07/14/2018)   Social Connection and  Isolation Panel    Frequency of Communication with Friends and Family: Twice a week    Frequency of Social Gatherings with Friends and Family: Once a week    Attends Religious Services: Never    Database administrator or Organizations: No    Attends Banker Meetings: Never    Marital Status: Widowed  Intimate Partner Violence: Not At Risk (07/14/2018)   Humiliation, Afraid, Rape, and Kick questionnaire    Fear of Current or Ex-Partner: No    Emotionally Abused: No    Physically Abused: No    Sexually Abused: No   Family History  Problem Relation Age of Onset   Cancer Mother        bladder    Diabetes Mother    Diabetes Father    Emphysema Father    GER disease Brother    Colon  cancer Neg Hx    Colon polyps Neg Hx       VITAL SIGNS BP 111/76   Pulse 83   Temp 97.9 F (36.6 C)   Resp 20   Ht 5' 8 (1.727 m)   Wt 210 lb 6.4 oz (95.4 kg)   SpO2 100%   BMI 31.99 kg/m   Outpatient Encounter Medications as of 04/22/2024  Medication Sig   Acetaminophen  (TYLENOL  ARTHRITIS PAIN PO) Take 650 mg by mouth in the morning and at bedtime.   aspirin  81 MG chewable tablet Chew 81 mg by mouth daily.   Balsam Peru-Castor Oil (VENELEX) OINT Apply topically. Apply to sacrum, bilateral buttocks and coccyx qshift for MAD & excoriation.   bisacodyl  (DULCOLAX) 10 MG suppository Place 10 mg rectally as needed.   Carbidopa-Levodopa ER (SINEMET CR) 25-100 MG tablet controlled release Take 1 tablet by mouth 3 (three) times daily.   FLUoxetine  (PROZAC ) 40 MG capsule Take 40 mg by mouth daily.   levETIRAcetam  (KEPPRA ) 250 MG tablet Take 250 mg by mouth 2 (two) times daily.   Melatonin 5 MG TABS Take 5 mg by mouth at bedtime.   NON FORMULARY Diet type: Regular   potassium chloride  (KLOR-CON ) 10 MEQ tablet Take 10 mEq by mouth daily.   sennosides-docusate sodium  (SENOKOT-S) 8.6-50 MG tablet Take 1 tablet by mouth in the morning and at bedtime. For Constipation   No facility-administered encounter medications on file as of 04/22/2024.     SIGNIFICANT DIAGNOSTIC EXAMS  LABS REVIEWED PREVIOUS  06-13-23: wbc 4.4; hgb 13.3; hct 41.7; mcv 93.5 plt 183; glucose 96; bun 15; creat 0.67; k+ 3.7; na++ 139; ca 8.6; gfr >60; protein 7.2 albumin 3.9; tsh 1.920; vitamin B 12: 474    10-04-23: glucose 82; bun 21; creat 0.52; k+ 3.8; na++ 137; ca 8.5; gfr >60 10-21-23: wbc 4.5; hgb 12.6; hct 38.8; mcv 92.2 plt 178; glucose 79; bun 22; creat 0.55; k+ 4.1; na++ 137; ca 8.4 gfr >60 protein 6.5 albumin 3.4 tsh 1.873; keppra  8.6    NO NEW LABS.   Review of Systems  Unable to perform ROS: Dementia   Physical Exam Constitutional:      General: She is not in acute distress.    Appearance: She is  well-developed. She is obese. She is not diaphoretic.  Neck:     Thyroid : No thyromegaly.  Cardiovascular:     Rate and Rhythm: Normal rate and regular rhythm.     Pulses: Normal pulses.     Heart sounds: Normal heart sounds.  Pulmonary:     Effort: Pulmonary effort is normal.  No respiratory distress.     Breath sounds: Normal breath sounds.  Abdominal:     General: Bowel sounds are normal. There is no distension.     Palpations: Abdomen is soft.     Tenderness: There is no abdominal tenderness.  Musculoskeletal:     Cervical back: Neck supple.     Right lower leg: No edema.     Left lower leg: No edema.     Comments: Has bilateral lower extremity contractures from chronic wheelchair use  Lymphadenopathy:     Cervical: No cervical adenopathy.  Skin:    General: Skin is warm and dry.  Neurological:     Mental Status: She is alert. Mental status is at baseline.  Psychiatric:        Mood and Affect: Mood normal.         ASSESSMENT/ PLAN:  TODAY:   Chronic pain syndrome: will continue tylenol  650 mg twice daily   2. Seizure disorder: (after CHI) no recent activity: will continue keppra  250 mg twice daily   3. Thrombocytopenia: plt 178   PREVIOUS    4. Dementia with agitation: weight is 210 pounds: she is off namenda (not providing benefit);  5. Simple chronic bronchitis: without flares; will continue advair 100/50 one puff twice daily   6. Essential hypertension: b/p 111/76: is currently off medications  7. Chronic constipation: will continue senna s twice daily   8. Hypokalemia: k+ 3.8; will monitor  9. Hallucination and delusion/agitation due to dementia: her mood state is stable no longer on rexulti  10. Unspecified hyperlipidemia: ldl 127 is off statins due to advanced age.   11. Unspecified obsessive compulsive disorder: will continue prozac  40 mg daily (failed wean X 2)   12. Morbid obesity: BMI 31.99 no significant change in weight  13. Parkinson's  disease: is without change will continue sinemet cr 25/100 mg three times daily    Will check cbc; cmp   Barnie Seip NP [facility]   call [phone]

## 2024-04-27 ENCOUNTER — Other Ambulatory Visit (HOSPITAL_COMMUNITY)
Admission: RE | Admit: 2024-04-27 | Discharge: 2024-04-27 | Disposition: A | Discharge status: Home / Self Care | Source: Skilled Nursing Facility | Attending: Adult Health | Admitting: Adult Health

## 2024-04-27 DIAGNOSIS — I10 Essential (primary) hypertension: Secondary | ICD-10-CM | POA: Diagnosis present

## 2024-04-27 LAB — CBC
HCT: 41.6 % (ref 36.0–46.0)
Hemoglobin: 13.4 g/dL (ref 12.0–15.0)
MCH: 30.1 pg (ref 26.0–34.0)
MCHC: 32.2 g/dL (ref 30.0–36.0)
MCV: 93.5 fL (ref 80.0–100.0)
Platelets: 180 K/uL (ref 150–400)
RBC: 4.45 MIL/uL (ref 3.87–5.11)
RDW: 13.7 % (ref 11.5–15.5)
WBC: 5.2 K/uL (ref 4.0–10.5)
nRBC: 0 % (ref 0.0–0.2)

## 2024-04-27 LAB — COMPREHENSIVE METABOLIC PANEL WITH GFR
ALT: 9 U/L (ref 0–44)
AST: 18 U/L (ref 15–41)
Albumin: 3.6 g/dL (ref 3.5–5.0)
Alkaline Phosphatase: 67 U/L (ref 38–126)
Anion gap: 9 (ref 5–15)
BUN: 32 mg/dL — ABNORMAL HIGH (ref 8–23)
CO2: 29 mmol/L (ref 22–32)
Calcium: 8.6 mg/dL — ABNORMAL LOW (ref 8.9–10.3)
Chloride: 103 mmol/L (ref 98–111)
Creatinine, Ser: 0.56 mg/dL (ref 0.44–1.00)
GFR, Estimated: >60 mL/min (ref >60)
Glucose, Bld: 81 mg/dL (ref 70–99)
Potassium: 3.8 mmol/L (ref 3.5–5.1)
Sodium: 141 mmol/L (ref 135–145)
Total Bilirubin: 0.6 mg/dL (ref 0.0–1.2)
Total Protein: 7.2 g/dL (ref 6.5–8.1)

## 2024-04-28 DIAGNOSIS — G894 Chronic pain syndrome: Secondary | ICD-10-CM | POA: Insufficient documentation

## 2024-05-18 ENCOUNTER — Encounter: Payer: Self-pay | Admitting: Internal Medicine

## 2024-05-18 ENCOUNTER — Non-Acute Institutional Stay (SKILLED_NURSING_FACILITY): Payer: Self-pay | Admitting: Internal Medicine

## 2024-05-18 DIAGNOSIS — E440 Moderate protein-calorie malnutrition: Secondary | ICD-10-CM

## 2024-05-18 DIAGNOSIS — R030 Elevated blood-pressure reading, without diagnosis of hypertension: Secondary | ICD-10-CM

## 2024-05-18 DIAGNOSIS — G20A2 Parkinson's disease without dyskinesia, with fluctuations: Secondary | ICD-10-CM | POA: Diagnosis not present

## 2024-05-18 DIAGNOSIS — D696 Thrombocytopenia, unspecified: Secondary | ICD-10-CM | POA: Diagnosis not present

## 2024-05-18 DIAGNOSIS — G40909 Epilepsy, unspecified, not intractable, without status epilepticus: Secondary | ICD-10-CM | POA: Diagnosis not present

## 2024-05-18 NOTE — Assessment & Plan Note (Signed)
 No seizures reported on present regimen.  Continue to monitor.

## 2024-05-18 NOTE — Assessment & Plan Note (Signed)
 Current platelet count 180,000; this is serially stable.  No bleeding dyscrasias reported by staff; continue to monitor.

## 2024-05-18 NOTE — Assessment & Plan Note (Addendum)
 Clinically her Parkinson's is very stable on low-dose Parkinson medications.  No change; neurology follow-up if symptoms/ signs progress.

## 2024-05-18 NOTE — Progress Notes (Signed)
 NURSING HOME LOCATION:  [facility] ROOM NUMBER:  [id number]  CODE STATUS:  DNR  PCP: Landy Barnie RAMAN, NP   This is a nursing facility follow up visit of chronic medical diagnoses to document compliance with Regulation 483.30 (c) in The Long Term Care Survey Manual Phase 2 which mandates caregiver visit ( visits can alternate among physician, PA or NP as per statutes) within 10 days of 30 days / 60 days/ 90 days post admission to SNF date  .  Interim medical record and care since last SNF visit was updated with review of diagnostic studies and change in clinical status since last visit were documented.  HPI: She is a permanent resident of this facility with medical diagnoses of RLS; essential hypertension; history of diverticulosis; GERD with history of esophageal web; history of Helicobacter pylori gastritis; and dyslipidemia. Labs are current as of 04/27/2024; there was slight prerenal azotemia with a BUN of 32.  Creatinine was 0.56 and GFR greater than 60.  Hypocalcemia has improved from 8.4 up to 8.6.  CBC was normal.  Albumin improved from 3.4 up to 3.6 and total protein from 6.5 up to 7.2.  Review of systems: She is essentially nonverbal and all responses were negative shake of the head and a mumbled no. Constitutional: No fever, significant weight change, fatigue  Eyes: No redness, discharge, pain, vision change ENT/mouth: No nasal congestion,  purulent discharge, earache, change in hearing, sore throat  Cardiovascular: No chest pain, palpitations, paroxysmal nocturnal dyspnea, edema  Respiratory: No cough, sputum production, hemoptysis, DOE, significant snoring, apnea   Gastrointestinal: No heartburn, dysphagia, abdominal pain, nausea /vomiting, rectal bleeding, melena, change in bowels Genitourinary: No dysuria, hematuria, pyuria, incontinence, nocturia Musculoskeletal: No joint stiffness, joint swelling, weakness, pain Dermatologic: No rash, pruritus, change in  appearance of skin Neurologic: No dizziness, headache, syncope, seizures, numbness, tingling Psychiatric: No significant anxiety, depression, insomnia, anorexia Endocrine: No change in hair/skin/nails, excessive thirst, excessive hunger, excessive urination  Hematologic/lymphatic: No significant bruising, lymphadenopathy, abnormal bleeding Allergy/immunology: No itchy/watery eyes, significant sneezing, urticaria, angioedema  Physical exam:  Pertinent or positive findings: She appears her age and suboptimally nourished.  Hair is thin with patchy alopecia over the crown.  The right lid is slightly more puffy than the left.  The right lower lip is puckered outward laterally. The maxilla appears edentulous.  She has a few remaining mandibular teeth which are carious and coated. Hirsutism of the chin is noted.  Second heart sound is increased.  Breath sounds are decreased; she does exhibit some low-grade musical rhonchi on expiration. Pedal pulses are not palpable.  Strength could not be tested as she could not follow commands to oppose my hands.  Interosseous wasting is present.  Limb atrophy is noted.  She has small punctate vitiliginous lesions over the forearms as well as to a lesser extent over the skin of the head.  There is a larger lesion over the left temple.   General appearance:  no acute distress, increased work of breathing is present.   Lymphatic: No lymphadenopathy about the head, neck, axilla. Eyes: No conjunctival inflammation or lid edema is present. There is no scleral icterus. Ears:  External ear exam shows no significant lesions or deformities.   Nose:  External nasal examination shows no deformity or inflammation. Nasal mucosa are pink and moist without lesions, exudates Neck:  No thyromegaly, masses, tenderness noted.    Heart:  Normal rate and regular rhythm without gallop, murmur, click, rub .  Lungs:  without wheezes,  rales, rubs. Abdomen: Bowel sounds are normal. Abdomen is  soft and nontender with no organomegaly, hernias, masses. GU: Deferred  Extremities:  No cyanosis, clubbing, edema  Neurologic exam :Balance, Rhomberg, finger to nose testing could not be completed due to clinical state Skin: Warm & dry w/o tenting. No significant rash.  See summary under each active problem in the Problem List with associated updated therapeutic plan

## 2024-05-18 NOTE — Patient Instructions (Signed)
 See assessment and plan under each diagnosis in the problem list and acutely for this visit

## 2024-05-18 NOTE — Assessment & Plan Note (Addendum)
 On 04/27/2024 albumin had improved from 3.4-3.6 and total protein from 6.5-7.2. She exhibits limb atrophy and interosseous wasting.  Nutritionist continues to follow.

## 2024-06-05 ENCOUNTER — Encounter: Payer: Self-pay | Admitting: Adult Health

## 2024-06-05 ENCOUNTER — Non-Acute Institutional Stay (SKILLED_NURSING_FACILITY): Payer: Self-pay | Admitting: Adult Health

## 2024-06-05 DIAGNOSIS — G40909 Epilepsy, unspecified, not intractable, without status epilepticus: Secondary | ICD-10-CM | POA: Diagnosis not present

## 2024-06-05 DIAGNOSIS — I10 Essential (primary) hypertension: Secondary | ICD-10-CM

## 2024-06-05 DIAGNOSIS — F01C11 Vascular dementia, severe, with agitation: Secondary | ICD-10-CM | POA: Diagnosis not present

## 2024-06-05 NOTE — Progress Notes (Signed)
 Location:  [facility] Room Number: 74 D Place of Service:  SNF (31)   CODE STATUS: DNR  No Known Allergies  Chief Complaint  Patient presents with   Care Plan Meeting     HPI:  We have come together for her care plan meeting. BIMS 3/15 mood 0/30. She is using wheelchair with falls without injury. She requires dependent assist with her adl care. She is incontinent of bladder and bowel. Dietary: D3 thin liquids, is in restorative dining; has poor attention span. Appetite 76-100% weight is 212.4 pounds is stable. Therapy: PT: wheelchair positioning and transfers. Activities: does participate, but is more passive. She will continue to be followed for her chronic illnesses including:   Essential hypertension  Severe vascular dementia with agitation   Seizure  Past Medical History:  Diagnosis Date   Diverticulosis OCT 2010 TCS   SIGMOID COLON   Esophageal web 10/08/2008   DILATION 16 MM   Gastritis, Helicobacter pylori 10/08/2008   Rx. ABO for 10 days    GERD (gastroesophageal reflux disease)    Hip fracture, left (HCC)    2016   Hyperlipidemia    Hypertension    Restless legs syndrome     Past Surgical History:  Procedure Laterality Date   ABDOMINAL HYSTERECTOMY     COLONOSCOPY  OCT 2010   TORTUOUS, Sml IH, Tics, MULTIPLE SIMPLE ADENOMAS (<6MM)   HIP ARTHROPLASTY Left 11/19/2014   Procedure: LEFT PARTIAL HIP REPLACEMENT;  Surgeon: Taft FORBES Minerva, MD;  Location: [facility];  Service: Orthopedics;  Laterality: Left;   ORIF HIP FRACTURE  04/05/2012   Procedure: OPEN REDUCTION INTERNAL FIXATION HIP;  Surgeon: Taft FORBES Minerva, MD;  Location: [facility];  Service: Orthopedics;  Laterality: Right;   UPPER GASTROINTESTINAL ENDOSCOPY  AUG 2010   SAVARY   VESICOVAGINAL FISTULA CLOSURE W/ TAH  1966   bleeding     Social History   Socioeconomic History   Marital status: Widowed    Spouse name: Not on file   Number of children: Not on file   Years of  education: Not on file   Highest education level: Not on file  Occupational History   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 agent    Occupation: retired   Tobacco Use   Smoking status: Never   Smokeless tobacco: Never  Vaping Use   Vaping status: Never Used  Substance and Sexual Activity   Alcohol use: No   Drug use: No   Sexual activity: Not Currently  Other Topics Concern   Not on file  Social History Narrative   Long term resident of [facility]    Social Drivers of Health   Financial Resource Strain: Low Risk  (07/14/2018)   Overall Financial Resource Strain (CARDIA)    Difficulty of Paying Living Expenses: Not hard at all  Food Insecurity: No Food Insecurity (07/14/2018)   Hunger Vital Sign    Worried About Running Out of Food in the Last Year: Never true    Ran Out of Food in the Last Year: Never true  Transportation Needs: No Transportation Needs (07/14/2018)   PRAPARE - Administrator, Civil Service (Medical): No    Lack of Transportation (Non-Medical): No  Physical Activity: Insufficiently Active (07/14/2018)   Exercise Vital Sign    Days of Exercise per Week: 4 days    Minutes of Exercise per Session: 20 min  Stress: Stress Concern Present (07/14/2018)   [employer] of Occupational Health -  Occupational Stress Questionnaire    Feeling of Stress : To some extent  Social Connections: Moderately Isolated (07/14/2018)   Social Connection and Isolation Panel    Frequency of Communication with Friends and Family: Twice a week    Frequency of Social Gatherings with Friends and Family: Once a week    Attends Religious Services: Never    Database administrator or Organizations: No    Attends Banker Meetings: Never    Marital Status: Widowed  Intimate Partner Violence: Not At Risk (07/14/2018)   Humiliation, Afraid, Rape, and Kick questionnaire    Fear of Current or Ex-Partner: No    Emotionally Abused: No    Physically Abused: No     Sexually Abused: No   Family History  Problem Relation Age of Onset   Cancer Mother        bladder    Diabetes Mother    Diabetes Father    Emphysema Father    GER disease Brother    Colon cancer Neg Hx    Colon polyps Neg Hx       VITAL SIGNS BP 130/77   Pulse 62   Temp (!) 97.2 F (36.2 C)   Resp 20   Ht 5' 8.7 (1.745 m)   Wt 212 lb 6.4 oz (96.3 kg)   SpO2 93%   BMI 31.64 kg/m   Outpatient Encounter Medications as of 06/05/2024  Medication Sig   acetaminophen  (TYLENOL ) 325 MG tablet Take 650 mg by mouth 2 (two) times daily.   aspirin  81 MG chewable tablet Chew 81 mg by mouth daily.   Balsam [place]-Castor Oil (VENELEX) OINT Apply topically. Apply to sacrum, bilateral buttocks and coccyx qshift for MAD & excoriation.   bisacodyl  (DULCOLAX) 10 MG suppository Place 10 mg rectally as needed.   Carbidopa-Levodopa ER (SINEMET CR) 25-100 MG tablet controlled release Take 1 tablet by mouth 3 (three) times daily.   feeding supplement (ENSURE PLUS HIGH PROTEIN) LIQD Take 237 mLs by mouth 2 (two) times daily between meals.   FLUoxetine  (PROZAC ) 40 MG capsule Take 40 mg by mouth daily.   levETIRAcetam  (KEPPRA ) 250 MG tablet Take 250 mg by mouth 2 (two) times daily.   Melatonin 5 MG TABS Take 5 mg by mouth at bedtime.   NON FORMULARY Diet type: DYSPHAGIA 3 / THIN LIQUIDS.  Add soft sandwich to Lunch and Dinner trays   potassium chloride  (KLOR-CON ) 10 MEQ tablet Take 10 mEq by mouth daily.   Salicylic Acid (CORN REMOVER ONE STEP) 40 % PADS Apply 1 patch topically at bedtime.   sennosides-docusate sodium  (SENOKOT-S) 8.6-50 MG tablet Take 1 tablet by mouth in the morning and at bedtime. For Constipation   Acetaminophen  (TYLENOL  ARTHRITIS PAIN PO) Take 650 mg by mouth in the morning and at bedtime. (Patient not taking: Reported on 06/05/2024)   No facility-administered encounter medications on file as of 06/05/2024.     SIGNIFICANT DIAGNOSTIC EXAMS  LABS REVIEWED PREVIOUS  06-13-23:  wbc 4.4; hgb 13.3; hct 41.7; mcv 93.5 plt 183; glucose 96; bun 15; creat 0.67; k+ 3.7; na++ 139; ca 8.6; gfr >60; protein 7.2 albumin 3.9; tsh 1.920; vitamin B 12: 474    10-04-23: glucose 82; bun 21; creat 0.52; k+ 3.8; na++ 137; ca 8.5; gfr >60 10-21-23: wbc 4.5; hgb 12.6; hct 38.8; mcv 92.2 plt 178; glucose 79; bun 22; creat 0.55; k+ 4.1; na++ 137; ca 8.4 gfr >60 protein 6.5 albumin 3.4 tsh 1.873; keppra  8.6  NO NEW LABS.   Review of Systems  Unable to perform ROS: Dementia    Physical Exam Constitutional:      General: She is not in acute distress.    Appearance: She is well-developed. She is not diaphoretic.  Neck:     Thyroid : No thyromegaly.  Cardiovascular:     Rate and Rhythm: Normal rate and regular rhythm.     Heart sounds: Normal heart sounds.  Pulmonary:     Effort: Pulmonary effort is normal. No respiratory distress.     Breath sounds: Normal breath sounds.  Abdominal:     General: Bowel sounds are normal. There is no distension.     Palpations: Abdomen is soft.     Tenderness: There is no abdominal tenderness.  Musculoskeletal:     Cervical back: Neck supple.     Right lower leg: No edema.     Left lower leg: No edema.     Comments:  Has bilateral lower extremity contractures from chronic wheelchair use   Lymphadenopathy:     Cervical: No cervical adenopathy.  Skin:    General: Skin is warm and dry.  Neurological:     Mental Status: She is alert. Mental status is at baseline.  Psychiatric:        Mood and Affect: Mood normal.      ASSESSMENT/ PLAN:  TODAY  Essential hypertension Severe vascular dementia with agitation Seizure  Will continue current medications Will continue current plan of care Will continue to monitor her status.   Time spent with patient: 40 minutes: medications; activities; dietary    Barnie Seip NP [facility]  call [phone]

## 2024-06-23 ENCOUNTER — Non-Acute Institutional Stay (SKILLED_NURSING_FACILITY): Payer: Self-pay | Admitting: Adult Health

## 2024-06-23 ENCOUNTER — Encounter: Payer: Self-pay | Admitting: Adult Health

## 2024-06-23 DIAGNOSIS — F01C11 Vascular dementia, severe, with agitation: Secondary | ICD-10-CM

## 2024-06-23 DIAGNOSIS — J410 Simple chronic bronchitis: Secondary | ICD-10-CM | POA: Diagnosis not present

## 2024-06-23 DIAGNOSIS — I10 Essential (primary) hypertension: Secondary | ICD-10-CM | POA: Diagnosis not present

## 2024-06-23 NOTE — Progress Notes (Signed)
 Location:  [facility] Room Number: 116/D Place of Service:  SNF (31)   CODE STATUS: DNR  No Known Allergies  Chief Complaint  Patient presents with   Medical Management of Chronic Issues              Dementia with agitation: Simple chronic bronchitis: Essential hypertension    HPI:  She is a 89 y.o. long term resident of this facility being seen for the management of her chronic illnesses:Dementia with agitation: Simple chronic bronchitis: Essential hypertension. There are no reports of uncontrolled pain. She is spending more time in bed; is sleeping more. Her weight is without significant change.    Past Medical History:  Diagnosis Date   Diverticulosis OCT 2010 TCS   SIGMOID COLON   Esophageal web 10/08/2008   DILATION 16 MM   Gastritis, Helicobacter pylori 10/08/2008   Rx. ABO for 10 days    GERD (gastroesophageal reflux disease)    Hip fracture, left (HCC)    2016   Hyperlipidemia    Hypertension    Restless legs syndrome     Past Surgical History:  Procedure Laterality Date   ABDOMINAL HYSTERECTOMY     COLONOSCOPY  OCT 2010   TORTUOUS, Sml IH, Tics, MULTIPLE SIMPLE ADENOMAS (<6MM)   HIP ARTHROPLASTY Left 11/19/2014   Procedure: LEFT PARTIAL HIP REPLACEMENT;  Surgeon: Taft FORBES Minerva, MD;  Location: [facility];  Service: Orthopedics;  Laterality: Left;   ORIF HIP FRACTURE  04/05/2012   Procedure: OPEN REDUCTION INTERNAL FIXATION HIP;  Surgeon: Taft FORBES Minerva, MD;  Location: [facility];  Service: Orthopedics;  Laterality: Right;   UPPER GASTROINTESTINAL ENDOSCOPY  AUG 2010   SAVARY   VESICOVAGINAL FISTULA CLOSURE W/ TAH  1966   bleeding     Social History   Socioeconomic History   Marital status: Widowed    Spouse name: Not on file   Number of children: Not on file   Years of education: Not on file   Highest education level: Not on file  Occupational History   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 agent    Occupation:  retired   Tobacco Use   Smoking status: Never   Smokeless tobacco: Never  Vaping Use   Vaping status: Never Used  Substance and Sexual Activity   Alcohol use: No   Drug use: No   Sexual activity: Not Currently  Other Topics Concern   Not on file  Social History Narrative   Long term resident of [facility]    Social Drivers of Health   Financial Resource Strain: Low Risk  (07/14/2018)   Overall Financial Resource Strain (CARDIA)    Difficulty of Paying Living Expenses: Not hard at all  Food Insecurity: No Food Insecurity (07/14/2018)   Hunger Vital Sign    Worried About Running Out of Food in the Last Year: Never true    Ran Out of Food in the Last Year: Never true  Transportation Needs: No Transportation Needs (07/14/2018)   PRAPARE - Administrator, Civil Service (Medical): No    Lack of Transportation (Non-Medical): No  Physical Activity: Insufficiently Active (07/14/2018)   Exercise Vital Sign    Days of Exercise per Week: 4 days    Minutes of Exercise per Session: 20 min  Stress: Stress Concern Present (07/14/2018)   [employer] of Occupational Health - Occupational Stress Questionnaire    Feeling of Stress : To some extent  Social Connections: Moderately Isolated (07/14/2018)  Social Advertising account executive    Frequency of Communication with Friends and Family: Twice a week    Frequency of Social Gatherings with Friends and Family: Once a week    Attends Religious Services: Never    Database administrator or Organizations: No    Attends Banker Meetings: Never    Marital Status: Widowed  Intimate Partner Violence: Not At Risk (07/14/2018)   Humiliation, Afraid, Rape, and Kick questionnaire    Fear of Current or Ex-Partner: No    Emotionally Abused: No    Physically Abused: No    Sexually Abused: No   Family History  Problem Relation Age of Onset   Cancer Mother        bladder    Diabetes Mother    Diabetes Father    Emphysema Father     GER disease Brother    Colon cancer Neg Hx    Colon polyps Neg Hx       VITAL SIGNS BP 117/71   Pulse 64   Temp 98.2 F (36.8 C)   Resp 18   Ht 5' 8.7 (1.745 m)   Wt 210 lb 9.6 oz (95.5 kg)   SpO2 97%   BMI 31.37 kg/m   Outpatient Encounter Medications as of 06/23/2024  Medication Sig   acetaminophen  (TYLENOL ) 325 MG tablet Take 650 mg by mouth 2 (two) times daily.   aspirin  81 MG chewable tablet Chew 81 mg by mouth daily.   Balsam [place]-Castor Oil (VENELEX) OINT Apply topically. Apply to sacrum, bilateral buttocks and coccyx qshift for MAD & excoriation.   bisacodyl  (DULCOLAX) 10 MG suppository Place 10 mg rectally as needed.   Carbidopa-Levodopa ER (SINEMET CR) 25-100 MG tablet controlled release Take 1 tablet by mouth 3 (three) times daily.   feeding supplement (ENSURE PLUS HIGH PROTEIN) LIQD Take 237 mLs by mouth 2 (two) times daily between meals.   FLUoxetine  (PROZAC ) 40 MG capsule Take 40 mg by mouth daily.   levETIRAcetam  (KEPPRA ) 250 MG tablet Take 250 mg by mouth 2 (two) times daily.   Melatonin 5 MG TABS Take 5 mg by mouth at bedtime.   NON FORMULARY Diet type: DYSPHAGIA 3 / THIN LIQUIDS.  Add soft sandwich to Lunch and Dinner trays   potassium chloride  (KLOR-CON ) 10 MEQ tablet Take 10 mEq by mouth daily.   Salicylic Acid (CORN REMOVER ONE STEP) 40 % PADS Apply 1 patch topically at bedtime.   sennosides-docusate sodium  (SENOKOT-S) 8.6-50 MG tablet Take 1 tablet by mouth in the morning and at bedtime. For Constipation   Acetaminophen  (TYLENOL  ARTHRITIS PAIN PO) Take 650 mg by mouth in the morning and at bedtime. (Patient not taking: Reported on 06/23/2024)   No facility-administered encounter medications on file as of 06/23/2024.     SIGNIFICANT DIAGNOSTIC EXAMS  LABS REVIEWED PREVIOUS  06-13-23: wbc 4.4; hgb 13.3; hct 41.7; mcv 93.5 plt 183; glucose 96; bun 15; creat 0.67; k+ 3.7; na++ 139; ca 8.6; gfr >60; protein 7.2 albumin 3.9; tsh 1.920; vitamin B 12: 474     10-04-23: glucose 82; bun 21; creat 0.52; k+ 3.8; na++ 137; ca 8.5; gfr >60 10-21-23: wbc 4.5; hgb 12.6; hct 38.8; mcv 92.2 plt 178; glucose 79; bun 22; creat 0.55; k+ 4.1; na++ 137; ca 8.4 gfr >60 protein 6.5 albumin 3.4 tsh 1.873; keppra  8.6    TODAY  04-27-24: wbc 5.2; hgb 13.4; hct 41.6; mcv 93.5 plt 180; glucose 81; bun 32; creat 0.56; k+  3.8; na++ 141; ca 8.6; gfr >60 protein 7.2 albumin 3.6   Review of Systems  Unable to perform ROS: Dementia    Physical Exam Constitutional:      General: She is not in acute distress.    Appearance: She is well-developed. She is obese. She is not diaphoretic.  Neck:     Thyroid : No thyromegaly.  Cardiovascular:     Rate and Rhythm: Normal rate and regular rhythm.     Heart sounds: Normal heart sounds.  Pulmonary:     Effort: Pulmonary effort is normal. No respiratory distress.     Breath sounds: Normal breath sounds.  Abdominal:     General: Bowel sounds are normal. There is no distension.     Palpations: Abdomen is soft.     Tenderness: There is no abdominal tenderness.  Musculoskeletal:     Cervical back: Neck supple.     Right lower leg: No edema.     Left lower leg: No edema.     Comments:  Has bilateral lower extremity contractures from chronic wheelchair use  Lymphadenopathy:     Cervical: No cervical adenopathy.  Skin:    General: Skin is warm and dry.     Comments: Stage 2: 1.2 x 1.2 cm  Neurological:     Mental Status: She is alert. Mental status is at baseline.  Psychiatric:        Mood and Affect: Mood normal.          ASSESSMENT/ PLAN:  TODAY:   Dementia with agitation: weight is 210 pounds; is presently off medications;   2. Simple chronic bronchitis: without flares: will continue to monitor her status; is not on advair as she is unable to use this inhaler.   3. Essential hypertension: b/p 117/71 is presently off medications.   PREVIOUS   4. Chronic constipation: will continue senna s twice daily   5.  Hypokalemia: k+ 3.8; will monitor  6. Hallucination and delusion/agitation due to dementia: her mood state is stable no longer on rexulti  7. Unspecified hyperlipidemia: ldl 127 is off statins due to advanced age.   8. Unspecified obsessive compulsive disorder: will continue prozac  40 mg daily (failed wean X 2)   9. Morbid obesity: BMI 31.99 no significant change in weight  10. Parkinson's disease: is without change will continue sinemet cr 25/100 mg three times daily   11. Chronic pain syndrome: will continue tylenol  650 mg twice daily   12. Seizure disorder: (after CHI) no recent activity: will continue keppra  250 mg twice daily   13. Thrombocytopenia: plt 178      Barnie Seip NP [facility]  call [phone]

## 2024-07-02 ENCOUNTER — Encounter: Payer: Self-pay | Admitting: Adult Health

## 2024-07-02 ENCOUNTER — Non-Acute Institutional Stay (SKILLED_NURSING_FACILITY): Payer: Self-pay | Admitting: Adult Health

## 2024-07-02 DIAGNOSIS — G894 Chronic pain syndrome: Secondary | ICD-10-CM | POA: Diagnosis not present

## 2024-07-02 NOTE — Progress Notes (Signed)
 Location:  [facility] Room Number: 116/D Place of Service:  SNF (31)   CODE STATUS: DNR  No Known Allergies  Chief Complaint  Patient presents with   Acute Visit    Pain management     HPI:  She is presently taking tylenol  650 mg twice daily for pain management. She appears to be in pain. She is not able to rest comfortably. She is restless. The staff is wanting to increase her tylenol  to four times daily.   Past Medical History:  Diagnosis Date   Diverticulosis OCT 2010 TCS   SIGMOID COLON   Esophageal web 10/08/2008   DILATION 16 MM   Gastritis, Helicobacter pylori 10/08/2008   Rx. ABO for 10 days    GERD (gastroesophageal reflux disease)    Hip fracture, left (HCC)    2016   Hyperlipidemia    Hypertension    Restless legs syndrome     Past Surgical History:  Procedure Laterality Date   ABDOMINAL HYSTERECTOMY     COLONOSCOPY  OCT 2010   TORTUOUS, Sml IH, Tics, MULTIPLE SIMPLE ADENOMAS (<6MM)   HIP ARTHROPLASTY Left 11/19/2014   Procedure: LEFT PARTIAL HIP REPLACEMENT;  Surgeon: Taft FORBES Minerva, MD;  Location: [facility];  Service: Orthopedics;  Laterality: Left;   ORIF HIP FRACTURE  04/05/2012   Procedure: OPEN REDUCTION INTERNAL FIXATION HIP;  Surgeon: Taft FORBES Minerva, MD;  Location: [facility];  Service: Orthopedics;  Laterality: Right;   UPPER GASTROINTESTINAL ENDOSCOPY  AUG 2010   SAVARY   VESICOVAGINAL FISTULA CLOSURE W/ TAH  1966   bleeding     Social History   Socioeconomic History   Marital status: Widowed    Spouse name: Not on file   Number of children: Not on file   Years of education: Not on file   Highest education level: Not on file  Occupational History   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 agent    Occupation: retired   Tobacco Use   Smoking status: Never   Smokeless tobacco: Never  Vaping Use   Vaping status: Never Used  Substance and Sexual Activity   Alcohol use: No   Drug use: No   Sexual  activity: Not Currently  Other Topics Concern   Not on file  Social History Narrative   Long term resident of [facility]    Social Drivers of Health   Financial Resource Strain: Low Risk  (07/14/2018)   Overall Financial Resource Strain (CARDIA)    Difficulty of Paying Living Expenses: Not hard at all  Food Insecurity: No Food Insecurity (07/14/2018)   Hunger Vital Sign    Worried About Running Out of Food in the Last Year: Never true    Ran Out of Food in the Last Year: Never true  Transportation Needs: No Transportation Needs (07/14/2018)   PRAPARE - Administrator, Civil Service (Medical): No    Lack of Transportation (Non-Medical): No  Physical Activity: Insufficiently Active (07/14/2018)   Exercise Vital Sign    Days of Exercise per Week: 4 days    Minutes of Exercise per Session: 20 min  Stress: Stress Concern Present (07/14/2018)   [employer] of Occupational Health - Occupational Stress Questionnaire    Feeling of Stress : To some extent  Social Connections: Moderately Isolated (07/14/2018)   Social Connection and Isolation Panel    Frequency of Communication with Friends and Family: Twice a week    Frequency of Social Gatherings with Friends and  Family: Once a week    Attends Religious Services: Never    Active Member of Clubs or Organizations: No    Attends Banker Meetings: Never    Marital Status: Widowed  Intimate Partner Violence: Not At Risk (07/14/2018)   Humiliation, Afraid, Rape, and Kick questionnaire    Fear of Current or Ex-Partner: No    Emotionally Abused: No    Physically Abused: No    Sexually Abused: No   Family History  Problem Relation Age of Onset   Cancer Mother        bladder    Diabetes Mother    Diabetes Father    Emphysema Father    GER disease Brother    Colon cancer Neg Hx    Colon polyps Neg Hx       VITAL SIGNS BP (!) 107/55   Pulse (!) 54   Temp 98 F (36.7 C)   Ht 5' 8.75 (1.746 m)   Wt 210 lb 9.6 oz  (95.5 kg)   SpO2 92%   BMI 31.33 kg/m   Outpatient Encounter Medications as of 07/02/2024  Medication Sig   acetaminophen  (TYLENOL ) 325 MG tablet Take 650 mg by mouth 2 (two) times daily.   aspirin  81 MG chewable tablet Chew 81 mg by mouth daily.   Balsam [place]-Castor Oil (VENELEX) OINT Apply topically. Apply to sacrum, bilateral buttocks and coccyx qshift for MAD & excoriation.   bisacodyl  (DULCOLAX) 10 MG suppository Place 10 mg rectally as needed.   Carbidopa-Levodopa ER (SINEMET CR) 25-100 MG tablet controlled release Take 1 tablet by mouth 3 (three) times daily.   feeding supplement (ENSURE PLUS HIGH PROTEIN) LIQD Take 237 mLs by mouth 2 (two) times daily between meals.   FLUoxetine  (PROZAC ) 40 MG capsule Take 40 mg by mouth daily.   levETIRAcetam  (KEPPRA ) 250 MG tablet Take 250 mg by mouth 2 (two) times daily.   Melatonin 5 MG TABS Take 5 mg by mouth at bedtime.   NON FORMULARY Diet type: DYSPHAGIA 3 / THIN LIQUIDS.  Add soft sandwich to Lunch and Dinner trays   potassium chloride  (KLOR-CON ) 10 MEQ tablet Take 10 mEq by mouth daily.   sennosides-docusate sodium  (SENOKOT-S) 8.6-50 MG tablet Take 1 tablet by mouth in the morning and at bedtime. For Constipation   Wound Cleansers (VASHE WOUND) 0.033 % SOLN Apply topically daily.   Acetaminophen  (TYLENOL  ARTHRITIS PAIN PO) Take 650 mg by mouth in the morning and at bedtime. (Patient not taking: Reported on 06/23/2024)   Salicylic Acid (CORN REMOVER ONE STEP) 40 % PADS Apply 1 patch topically at bedtime. (Patient not taking: Reported on 07/02/2024)   No facility-administered encounter medications on file as of 07/02/2024.     SIGNIFICANT DIAGNOSTIC EXAMS  LABS REVIEWED PREVIOUS     10-04-23: glucose 82; bun 21; creat 0.52; k+ 3.8; na++ 137; ca 8.5; gfr >60 10-21-23: wbc 4.5; hgb 12.6; hct 38.8; mcv 92.2 plt 178; glucose 79; bun 22; creat 0.55; k+ 4.1; na++ 137; ca 8.4 gfr >60 protein 6.5 albumin 3.4 tsh 1.873; keppra  8.6   04-27-24: wbc  5.2; hgb 13.4; hct 41.6; mcv 93.5 plt 180; glucose 81; bun 32; creat 0.56; k+ 3.8; na++ 141; ca 8.6; gfr >60 protein 7.2 albumin 3.6  NO NEW LABS.    Review of Systems  Unable to perform ROS: Dementia    Physical Exam Constitutional:      General: She is not in acute distress.    Appearance: She  is well-developed. She is obese. She is not diaphoretic.  Neck:     Thyroid : No thyromegaly.  Cardiovascular:     Rate and Rhythm: Normal rate and regular rhythm.     Heart sounds: Normal heart sounds.  Pulmonary:     Effort: Pulmonary effort is normal. No respiratory distress.     Breath sounds: Normal breath sounds.  Abdominal:     General: Bowel sounds are normal. There is no distension.     Palpations: Abdomen is soft.     Tenderness: There is no abdominal tenderness.  Musculoskeletal:     Cervical back: Neck supple.     Right lower leg: No edema.     Left lower leg: No edema.     Comments:  Has bilateral lower extremity contractures from chronic wheelchair use   Lymphadenopathy:     Cervical: No cervical adenopathy.  Skin:    General: Skin is warm and dry.  Neurological:     Mental Status: She is alert. Mental status is at baseline.  Psychiatric:        Mood and Affect: Mood normal.       ASSESSMENT/ PLAN:  TODAY  Chronic pain syndrome: will increase her tylenol  cr 650 mg every 6 hours routinely.    Barnie Seip NP [facility]  Contact [phone] Monday through Friday 8am- 5pm  After hours call [phone]

## 2024-07-08 ENCOUNTER — Encounter: Payer: Self-pay | Admitting: Adult Health

## 2024-07-08 NOTE — Progress Notes (Signed)
 Location:  [facility] Room Number: 113 Place of Service:  SNF (31)   CODE STATUS: dnr   No Known Allergies  Chief Complaint  Patient presents with  . Acute Visit    Sacral wound     HPI:    Past Medical History:  Diagnosis Date  . Diverticulosis OCT 2010 TCS   SIGMOID COLON  . Esophageal web 10/08/2008   DILATION 16 MM  . Gastritis, Helicobacter pylori 10/08/2008   Rx. ABO for 10 days   . GERD (gastroesophageal reflux disease)   . Hip fracture, left (HCC)    2016  . Hyperlipidemia   . Hypertension   . Restless legs syndrome     Past Surgical History:  Procedure Laterality Date  . ABDOMINAL HYSTERECTOMY    . COLONOSCOPY  OCT 2010   TORTUOUS, Sml IH, Tics, MULTIPLE SIMPLE ADENOMAS (<6MM)  . HIP ARTHROPLASTY Left 11/19/2014   Procedure: LEFT PARTIAL HIP REPLACEMENT;  Surgeon: Taft FORBES Minerva, MD;  Location: [facility];  Service: Orthopedics;  Laterality: Left;  . ORIF HIP FRACTURE  04/05/2012   Procedure: OPEN REDUCTION INTERNAL FIXATION HIP;  Surgeon: Taft FORBES Minerva, MD;  Location: [facility];  Service: Orthopedics;  Laterality: Right;  . UPPER GASTROINTESTINAL ENDOSCOPY  AUG 2010   SAVARY  . VESICOVAGINAL FISTULA CLOSURE W/ TAH  1966   bleeding     Social History   Socioeconomic History  . Marital status: Widowed    Spouse name: Not on file  . Number of children: Not on file  . Years of education: Not on file  . Highest education level: Not on file  Occupational History  . Occupation: custodian / Health and safety inspector -[employer]   . Occupation: retired   Tobacco Use  . Smoking status: Never  . Smokeless tobacco: Never  Vaping Use  . Vaping status: Never Used  Substance and Sexual Activity  . Alcohol use: No  . Drug use: No  . Sexual activity: Not Currently  Other Topics Concern  . Not on file  Social History Narrative   Long term resident of [facility]    Social Drivers of Health   Financial Resource Strain: Low Risk   (07/14/2018)   Overall Financial Resource Strain (CARDIA)   . Difficulty of Paying Living Expenses: Not hard at all  Food Insecurity: No Food Insecurity (07/14/2018)   Hunger Vital Sign   . Worried About Programme researcher, broadcasting/film/video in the Last Year: Never true   . Ran Out of Food in the Last Year: Never true  Transportation Needs: No Transportation Needs (07/14/2018)   PRAPARE - Transportation   . Lack of Transportation (Medical): No   . Lack of Transportation (Non-Medical): No  Physical Activity: Insufficiently Active (07/14/2018)   Exercise Vital Sign   . Days of Exercise per Week: 4 days   . Minutes of Exercise per Session: 20 min  Stress: Stress Concern Present (07/14/2018)   [employer] of Occupational Health - Occupational Stress Questionnaire   . Feeling of Stress : To some extent  Social Connections: Moderately Isolated (07/14/2018)   Social Connection and Isolation Panel   . Frequency of Communication with Friends and Family: Twice a week   . Frequency of Social Gatherings with Friends and Family: Once a week   . Attends Religious Services: Never   . Active Member of Clubs or Organizations: No   . Attends Banker Meetings: Never   . Marital Status: Widowed  Catering manager  Violence: Not At Risk (07/14/2018)   Humiliation, Afraid, Rape, and Kick questionnaire   . Fear of Current or Ex-Partner: No   . Emotionally Abused: No   . Physically Abused: No   . Sexually Abused: No   Family History  Problem Relation Age of Onset  . Cancer Mother        bladder   . Diabetes Mother   . Diabetes Father   . Emphysema Father   . GER disease Brother   . Colon cancer Neg Hx   . Colon polyps Neg Hx       VITAL SIGNS BP 128/75   Pulse 81   Temp (!) 97.3 F (36.3 C)   Resp 16   Ht 5' 8 (1.727 m)   Wt 205 lb (93 kg)   SpO2 98%   BMI 31.17 kg/m   Outpatient Encounter Medications as of 07/08/2024  Medication Sig  . Acetaminophen  (TYLENOL  ARTHRITIS PAIN PO) Take 650  mg by mouth in the morning and at bedtime. (Patient not taking: Reported on 06/23/2024)  . acetaminophen  (TYLENOL ) 325 MG tablet Take 650 mg by mouth 2 (two) times daily.  . aspirin  81 MG chewable tablet Chew 81 mg by mouth daily.  . Balsam [place]-Castor Oil (VENELEX) OINT Apply topically. Apply to sacrum, bilateral buttocks and coccyx qshift for MAD & excoriation.  . bisacodyl  (DULCOLAX) 10 MG suppository Place 10 mg rectally as needed.  . Carbidopa-Levodopa ER (SINEMET CR) 25-100 MG tablet controlled release Take 1 tablet by mouth 3 (three) times daily.  . feeding supplement (ENSURE PLUS HIGH PROTEIN) LIQD Take 237 mLs by mouth 2 (two) times daily between meals.  . FLUoxetine  (PROZAC ) 40 MG capsule Take 40 mg by mouth daily.  . levETIRAcetam  (KEPPRA ) 250 MG tablet Take 250 mg by mouth 2 (two) times daily.  . Melatonin 5 MG TABS Take 5 mg by mouth at bedtime.  . NON FORMULARY Diet type: DYSPHAGIA 3 / THIN LIQUIDS.  Add soft sandwich to [employer] and [place]  . potassium chloride  (KLOR-CON ) 10 MEQ tablet Take 10 mEq by mouth daily.  . Salicylic Acid (CORN REMOVER ONE STEP) 40 % PADS Apply 1 patch topically at bedtime. (Patient not taking: Reported on 07/02/2024)  . sennosides-docusate sodium  (SENOKOT-S) 8.6-50 MG tablet Take 1 tablet by mouth in the morning and at bedtime. For Constipation  . Wound Cleansers (VASHE WOUND) 0.033 % SOLN Apply topically daily.   No facility-administered encounter medications on file as of 07/08/2024.     SIGNIFICANT DIAGNOSTIC EXAMS       ASSESSMENT/ PLAN:     Barnie Seip NP [facility]  Contact [phone] Monday through Friday 8am- 5pm  After hours call [phone]  This encounter was created in error - please disregard.

## 2024-07-09 ENCOUNTER — Other Ambulatory Visit (HOSPITAL_COMMUNITY)
Admission: RE | Admit: 2024-07-09 | Discharge: 2024-07-09 | Disposition: A | Discharge status: Home / Self Care | Source: Skilled Nursing Facility | Attending: Adult Health | Admitting: Adult Health

## 2024-07-09 DIAGNOSIS — I10 Essential (primary) hypertension: Secondary | ICD-10-CM | POA: Diagnosis present

## 2024-07-09 LAB — COMPREHENSIVE METABOLIC PANEL WITH GFR
ALT: <5 U/L (ref 0–44)
AST: 20 U/L (ref 15–41)
Albumin: 3.4 g/dL — ABNORMAL LOW (ref 3.5–5.0)
Alkaline Phosphatase: 61 U/L (ref 38–126)
Anion gap: 9 (ref 5–15)
BUN: 27 mg/dL — ABNORMAL HIGH (ref 8–23)
CO2: 30 mmol/L (ref 22–32)
Calcium: 8.5 mg/dL — ABNORMAL LOW (ref 8.9–10.3)
Chloride: 109 mmol/L (ref 98–111)
Creatinine, Ser: 0.55 mg/dL (ref 0.44–1.00)
GFR, Estimated: >60 mL/min (ref >60)
Glucose, Bld: 82 mg/dL (ref 70–99)
Potassium: 3.9 mmol/L (ref 3.5–5.1)
Sodium: 147 mmol/L — ABNORMAL HIGH (ref 135–145)
Total Bilirubin: 0.3 mg/dL (ref 0.0–1.2)
Total Protein: 6.8 g/dL (ref 6.5–8.1)

## 2024-07-09 LAB — CBC WITH DIFFERENTIAL/PLATELET
Abs Immature Granulocytes: 0.02 K/uL (ref 0.00–0.07)
Basophils Absolute: 0.1 K/uL (ref 0.0–0.1)
Basophils Relative: 1 %
Eosinophils Absolute: 0.2 K/uL (ref 0.0–0.5)
Eosinophils Relative: 2 %
HCT: 39.3 % (ref 36.0–46.0)
Hemoglobin: 12.4 g/dL (ref 12.0–15.0)
Immature Granulocytes: 0 %
Lymphocytes Relative: 24 %
Lymphs Abs: 1.6 K/uL (ref 0.7–4.0)
MCH: 30.2 pg (ref 26.0–34.0)
MCHC: 31.6 g/dL (ref 30.0–36.0)
MCV: 95.9 fL (ref 80.0–100.0)
Monocytes Absolute: 0.9 K/uL (ref 0.1–1.0)
Monocytes Relative: 13 %
Neutro Abs: 3.9 K/uL (ref 1.7–7.7)
Neutrophils Relative %: 60 %
Platelets: 234 K/uL (ref 150–400)
RBC: 4.1 MIL/uL (ref 3.87–5.11)
RDW: 13.2 % (ref 11.5–15.5)
WBC: 6.6 K/uL (ref 4.0–10.5)
nRBC: 0 % (ref 0.0–0.2)

## 2024-07-16 ENCOUNTER — Encounter: Payer: Self-pay | Admitting: Adult Health

## 2024-07-16 NOTE — Progress Notes (Signed)
 Location:  [facility] Room Number: 45 D Place of Service:  SNF (31)   CODE STATUS: DNR  No Known Allergies  Chief Complaint  Patient presents with   Nausea and vomiting    HPI:    Past Medical History:  Diagnosis Date   Diverticulosis OCT 2010 TCS   SIGMOID COLON   Esophageal web 10/08/2008   DILATION 16 MM   Gastritis, Helicobacter pylori 10/08/2008   Rx. ABO for 10 days    GERD (gastroesophageal reflux disease)    Hip fracture, left (HCC)    2016   Hyperlipidemia    Hypertension    Restless legs syndrome     Past Surgical History:  Procedure Laterality Date   ABDOMINAL HYSTERECTOMY     COLONOSCOPY  OCT 2010   TORTUOUS, Sml IH, Tics, MULTIPLE SIMPLE ADENOMAS (<6MM)   HIP ARTHROPLASTY Left 11/19/2014   Procedure: LEFT PARTIAL HIP REPLACEMENT;  Surgeon: Taft FORBES Minerva, MD;  Location: [facility];  Service: Orthopedics;  Laterality: Left;   ORIF HIP FRACTURE  04/05/2012   Procedure: OPEN REDUCTION INTERNAL FIXATION HIP;  Surgeon: Taft FORBES Minerva, MD;  Location: [facility];  Service: Orthopedics;  Laterality: Right;   UPPER GASTROINTESTINAL ENDOSCOPY  AUG 2010   SAVARY   VESICOVAGINAL FISTULA CLOSURE W/ TAH  1966   bleeding     Social History   Socioeconomic History   Marital status: Widowed    Spouse name: Not on file   Number of children: Not on file   Years of education: Not on file   Highest education level: Not on file  Occupational History   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 agent    Occupation: retired   Tobacco Use   Smoking status: Never   Smokeless tobacco: Never  Vaping Use   Vaping status: Never Used  Substance and Sexual Activity   Alcohol use: No   Drug use: No   Sexual activity: Not Currently  Other Topics Concern   Not on file  Social History Narrative   Long term resident of [facility]    Social Drivers of Health   Financial Resource Strain: Low Risk  (07/14/2018)   Overall Financial Resource  Strain (CARDIA)    Difficulty of Paying Living Expenses: Not hard at all  Food Insecurity: No Food Insecurity (07/14/2018)   Hunger Vital Sign    Worried About Running Out of Food in the Last Year: Never true    Ran Out of Food in the Last Year: Never true  Transportation Needs: No Transportation Needs (07/14/2018)   PRAPARE - Administrator, Civil Service (Medical): No    Lack of Transportation (Non-Medical): No  Physical Activity: Insufficiently Active (07/14/2018)   Exercise Vital Sign    Days of Exercise per Week: 4 days    Minutes of Exercise per Session: 20 min  Stress: Stress Concern Present (07/14/2018)   [employer] of Occupational Health - Occupational Stress Questionnaire    Feeling of Stress : To some extent  Social Connections: Moderately Isolated (07/14/2018)   Social Connection and Isolation Panel    Frequency of Communication with Friends and Family: Twice a week    Frequency of Social Gatherings with Friends and Family: Once a week    Attends Religious Services: Never    Database administrator or Organizations: No    Attends Banker Meetings: Never    Marital Status: Widowed  Intimate Partner Violence: Not At Risk (07/14/2018)  Humiliation, Afraid, Rape, and Kick questionnaire    Fear of Current or Ex-Partner: No    Emotionally Abused: No    Physically Abused: No    Sexually Abused: No   Family History  Problem Relation Age of Onset   Cancer Mother        bladder    Diabetes Mother    Diabetes Father    Emphysema Father    GER disease Brother    Colon cancer Neg Hx    Colon polyps Neg Hx       VITAL SIGNS BP 100/60   Pulse 64   Temp 97.6 F (36.4 C)   Resp 18   Ht 5' 8 (1.727 m)   Wt 208 lb 9.6 oz (94.6 kg)   SpO2 96%   BMI 31.72 kg/m   Outpatient Encounter Medications as of 07/16/2024  Medication Sig   acetaminophen  (TYLENOL ) 650 MG CR tablet Take 650 mg by mouth every 6 (six) hours.   ascorbic acid (VITAMIN C)  500 MG tablet Take 500 mg by mouth 2 (two) times daily.   aspirin  81 MG chewable tablet Chew 81 mg by mouth daily.   Balsam [place]-Castor Oil (VENELEX) OINT Apply topically. Apply to sacrum, bilateral buttocks and coccyx qshift for MAD & excoriation.   bisacodyl  (DULCOLAX) 10 MG suppository Place 10 mg rectally as needed.   Carbidopa-Levodopa ER (SINEMET CR) 25-100 MG tablet controlled release Take 1 tablet by mouth 3 (three) times daily.   cholecalciferol  (VITAMIN D3) 25 MCG (1000 UNIT) tablet Take 1,000 Units by mouth daily. Special Instructions: nutritional support for wound management Once A Day 09:00 AM   feeding supplement (ENSURE PLUS HIGH PROTEIN) LIQD Take 237 mLs by mouth 2 (two) times daily between meals.   FLUoxetine  (PROZAC ) 40 MG capsule Take 40 mg by mouth daily.   levETIRAcetam  (KEPPRA ) 250 MG tablet Take 250 mg by mouth 2 (two) times daily.   Melatonin 5 MG TABS Take 5 mg by mouth at bedtime.   Multiple Vitamins-Minerals (CENTRUM SILVER) tablet Take 1 tablet by mouth daily.   NON FORMULARY Diet type: DYSPHAGIA 3 / THIN LIQUIDS.  Add soft sandwich to Lunch and Dinner trays   ondansetron  (ZOFRAN ) 8 MG tablet Take 8 mg by mouth every 6 (six) hours as needed for nausea or vomiting.   potassium chloride  (KLOR-CON ) 10 MEQ tablet Take 10 mEq by mouth daily.   sennosides-docusate sodium  (SENOKOT-S) 8.6-50 MG tablet Take 1 tablet by mouth in the morning and at bedtime. For Constipation   Wound Cleansers (VASHE WOUND) 0.033 % SOLN Apply topically daily.   Salicylic Acid (CORN REMOVER ONE STEP) 40 % PADS Apply 1 patch topically at bedtime. (Patient not taking: Reported on 07/16/2024)   No facility-administered encounter medications on file as of 07/16/2024.     SIGNIFICANT DIAGNOSTIC EXAMS       ASSESSMENT/ PLAN:     Barnie Seip NP [facility]  Contact [phone] Monday through Friday 8am- 5pm  After hours call [phone]

## 2024-07-17 ENCOUNTER — Encounter: Payer: Self-pay | Admitting: Adult Health

## 2024-07-17 ENCOUNTER — Encounter (HOSPITAL_COMMUNITY): Payer: Self-pay | Admitting: Emergency Medicine

## 2024-07-17 ENCOUNTER — Emergency Department (HOSPITAL_COMMUNITY)

## 2024-07-17 ENCOUNTER — Other Ambulatory Visit: Payer: Self-pay

## 2024-07-17 ENCOUNTER — Inpatient Hospital Stay (HOSPITAL_COMMUNITY)
Admission: EM | Admit: 2024-07-17 | Discharge: 2024-07-24 | DRG: 871 | Disposition: A | Discharge status: Skilled Nursing Facility | Source: Skilled Nursing Facility | Attending: Internal Medicine | Admitting: Internal Medicine

## 2024-07-17 DIAGNOSIS — E785 Hyperlipidemia, unspecified: Secondary | ICD-10-CM | POA: Diagnosis present

## 2024-07-17 DIAGNOSIS — Z79899 Other long term (current) drug therapy: Secondary | ICD-10-CM

## 2024-07-17 DIAGNOSIS — G40909 Epilepsy, unspecified, not intractable, without status epilepticus: Secondary | ICD-10-CM | POA: Diagnosis present

## 2024-07-17 DIAGNOSIS — G2581 Restless legs syndrome: Secondary | ICD-10-CM | POA: Diagnosis present

## 2024-07-17 DIAGNOSIS — E876 Hypokalemia: Secondary | ICD-10-CM | POA: Diagnosis present

## 2024-07-17 DIAGNOSIS — F0390 Unspecified dementia without behavioral disturbance: Secondary | ICD-10-CM | POA: Diagnosis not present

## 2024-07-17 DIAGNOSIS — N179 Acute kidney failure, unspecified: Secondary | ICD-10-CM | POA: Insufficient documentation

## 2024-07-17 DIAGNOSIS — G9341 Metabolic encephalopathy: Secondary | ICD-10-CM | POA: Insufficient documentation

## 2024-07-17 DIAGNOSIS — Z8052 Family history of malignant neoplasm of bladder: Secondary | ICD-10-CM

## 2024-07-17 DIAGNOSIS — Z558 Other problems related to education and literacy: Secondary | ICD-10-CM | POA: Diagnosis not present

## 2024-07-17 DIAGNOSIS — R4182 Altered mental status, unspecified: Secondary | ICD-10-CM | POA: Diagnosis present

## 2024-07-17 DIAGNOSIS — F0280 Dementia in other diseases classified elsewhere without behavioral disturbance: Secondary | ICD-10-CM | POA: Diagnosis present

## 2024-07-17 DIAGNOSIS — R1310 Dysphagia, unspecified: Secondary | ICD-10-CM | POA: Diagnosis not present

## 2024-07-17 DIAGNOSIS — R6520 Severe sepsis without septic shock: Secondary | ICD-10-CM

## 2024-07-17 DIAGNOSIS — L89100 Pressure ulcer of unspecified part of back, unstageable: Secondary | ICD-10-CM

## 2024-07-17 DIAGNOSIS — Z825 Family history of asthma and other chronic lower respiratory diseases: Secondary | ICD-10-CM

## 2024-07-17 DIAGNOSIS — Z7982 Long term (current) use of aspirin: Secondary | ICD-10-CM | POA: Diagnosis not present

## 2024-07-17 DIAGNOSIS — E669 Obesity, unspecified: Secondary | ICD-10-CM | POA: Diagnosis present

## 2024-07-17 DIAGNOSIS — L89304 Pressure ulcer of unspecified buttock, stage 4: Secondary | ICD-10-CM | POA: Diagnosis not present

## 2024-07-17 DIAGNOSIS — R6521 Severe sepsis with septic shock: Secondary | ICD-10-CM | POA: Diagnosis present

## 2024-07-17 DIAGNOSIS — A4159 Other Gram-negative sepsis: Secondary | ICD-10-CM | POA: Diagnosis present

## 2024-07-17 DIAGNOSIS — Z515 Encounter for palliative care: Secondary | ICD-10-CM | POA: Diagnosis not present

## 2024-07-17 DIAGNOSIS — J9601 Acute respiratory failure with hypoxia: Secondary | ICD-10-CM | POA: Diagnosis present

## 2024-07-17 DIAGNOSIS — G20A1 Parkinson's disease without dyskinesia, without mention of fluctuations: Secondary | ICD-10-CM | POA: Diagnosis present

## 2024-07-17 DIAGNOSIS — E8809 Other disorders of plasma-protein metabolism, not elsewhere classified: Secondary | ICD-10-CM | POA: Diagnosis present

## 2024-07-17 DIAGNOSIS — E875 Hyperkalemia: Secondary | ICD-10-CM | POA: Diagnosis present

## 2024-07-17 DIAGNOSIS — Z833 Family history of diabetes mellitus: Secondary | ICD-10-CM

## 2024-07-17 DIAGNOSIS — L8990 Pressure ulcer of unspecified site, unspecified stage: Secondary | ICD-10-CM | POA: Insufficient documentation

## 2024-07-17 DIAGNOSIS — L89154 Pressure ulcer of sacral region, stage 4: Secondary | ICD-10-CM | POA: Diagnosis present

## 2024-07-17 DIAGNOSIS — Z66 Do not resuscitate: Secondary | ICD-10-CM | POA: Diagnosis present

## 2024-07-17 DIAGNOSIS — Z789 Other specified health status: Secondary | ICD-10-CM | POA: Diagnosis not present

## 2024-07-17 DIAGNOSIS — E872 Acidosis, unspecified: Secondary | ICD-10-CM | POA: Diagnosis present

## 2024-07-17 DIAGNOSIS — R578 Other shock: Secondary | ICD-10-CM | POA: Diagnosis not present

## 2024-07-17 DIAGNOSIS — E870 Hyperosmolality and hypernatremia: Secondary | ICD-10-CM | POA: Diagnosis present

## 2024-07-17 DIAGNOSIS — G20A2 Parkinson's disease without dyskinesia, with fluctuations: Secondary | ICD-10-CM | POA: Diagnosis not present

## 2024-07-17 DIAGNOSIS — I10 Essential (primary) hypertension: Secondary | ICD-10-CM | POA: Diagnosis present

## 2024-07-17 DIAGNOSIS — A419 Sepsis, unspecified organism: Secondary | ICD-10-CM | POA: Diagnosis not present

## 2024-07-17 DIAGNOSIS — G8929 Other chronic pain: Secondary | ICD-10-CM | POA: Diagnosis present

## 2024-07-17 DIAGNOSIS — L89104 Pressure ulcer of unspecified part of back, stage 4: Secondary | ICD-10-CM | POA: Diagnosis not present

## 2024-07-17 DIAGNOSIS — Z7189 Other specified counseling: Secondary | ICD-10-CM | POA: Diagnosis not present

## 2024-07-17 DIAGNOSIS — Z96642 Presence of left artificial hip joint: Secondary | ICD-10-CM | POA: Diagnosis present

## 2024-07-17 LAB — URINALYSIS, W/ REFLEX TO CULTURE (INFECTION SUSPECTED)
Bacteria, UA: NONE SEEN
Bilirubin Urine: NEGATIVE
Glucose, UA: NEGATIVE mg/dL
Ketones, ur: 5 mg/dL — AB
Nitrite: NEGATIVE
Protein, ur: 100 mg/dL — AB
RBC / HPF: >50 RBC/hpf (ref 0–5)
Specific Gravity, Urine: 1.024 (ref 1.005–1.030)
pH: 5 (ref 5.0–8.0)

## 2024-07-17 LAB — COMPREHENSIVE METABOLIC PANEL WITH GFR
ALT: 9 U/L (ref 0–44)
ALT: 22 U/L (ref 0–44)
AST: 51 U/L — ABNORMAL HIGH (ref 15–41)
AST: 75 U/L — ABNORMAL HIGH (ref 15–41)
Albumin: 3.5 g/dL (ref 3.5–5.0)
Albumin: 3 g/dL — ABNORMAL LOW (ref 3.5–5.0)
Alkaline Phosphatase: 88 U/L (ref 38–126)
Alkaline Phosphatase: 98 U/L (ref 38–126)
Anion gap: 16 — ABNORMAL HIGH (ref 5–15)
Anion gap: 14 (ref 5–15)
BUN: 42 mg/dL — ABNORMAL HIGH (ref 8–23)
BUN: 45 mg/dL — ABNORMAL HIGH (ref 8–23)
CO2: 28 mmol/L (ref 22–32)
CO2: 23 mmol/L (ref 22–32)
Calcium: 9.1 mg/dL (ref 8.9–10.3)
Calcium: 7.9 mg/dL — ABNORMAL LOW (ref 8.9–10.3)
Chloride: 102 mmol/L (ref 98–111)
Chloride: 106 mmol/L (ref 98–111)
Creatinine, Ser: 2.22 mg/dL — ABNORMAL HIGH (ref 0.44–1.00)
Creatinine, Ser: 2.25 mg/dL — ABNORMAL HIGH (ref 0.44–1.00)
GFR, Estimated: 21 mL/min — ABNORMAL LOW (ref >60)
GFR, Estimated: 20 mL/min — ABNORMAL LOW (ref >60)
Glucose, Bld: 106 mg/dL — ABNORMAL HIGH (ref 70–99)
Glucose, Bld: 102 mg/dL — ABNORMAL HIGH (ref 70–99)
Potassium: 5.2 mmol/L — ABNORMAL HIGH (ref 3.5–5.1)
Potassium: 4.3 mmol/L (ref 3.5–5.1)
Sodium: 147 mmol/L — ABNORMAL HIGH (ref 135–145)
Sodium: 143 mmol/L (ref 135–145)
Total Bilirubin: 0.3 mg/dL (ref 0.0–1.2)
Total Bilirubin: 0.3 mg/dL (ref 0.0–1.2)
Total Protein: 7.1 g/dL (ref 6.5–8.1)
Total Protein: 6.1 g/dL — ABNORMAL LOW (ref 6.5–8.1)

## 2024-07-17 LAB — CBC WITH DIFFERENTIAL/PLATELET
Abs Immature Granulocytes: 5.2 K/uL — ABNORMAL HIGH (ref 0.00–0.07)
Band Neutrophils: 12 %
Basophils Absolute: 0.3 K/uL — ABNORMAL HIGH (ref 0.0–0.1)
Basophils Relative: 1 %
Eosinophils Absolute: 0 K/uL (ref 0.0–0.5)
Eosinophils Relative: 0 %
HCT: 37.6 % (ref 36.0–46.0)
Hemoglobin: 12.1 g/dL (ref 12.0–15.0)
Lymphocytes Relative: 5 %
Lymphs Abs: 1.3 K/uL (ref 0.7–4.0)
MCH: 30.2 pg (ref 26.0–34.0)
MCHC: 32.2 g/dL (ref 30.0–36.0)
MCV: 93.8 fL (ref 80.0–100.0)
Metamyelocytes Relative: 3 %
Monocytes Absolute: 1.3 K/uL — ABNORMAL HIGH (ref 0.1–1.0)
Monocytes Relative: 5 %
Myelocytes: 12 %
Neutro Abs: 18 K/uL — ABNORMAL HIGH (ref 1.7–7.7)
Neutrophils Relative %: 57 %
Platelets: 217 K/uL (ref 150–400)
Promyelocytes Relative: 5 %
RBC: 4.01 MIL/uL (ref 3.87–5.11)
RDW: 14.1 % (ref 11.5–15.5)
Smear Review: NORMAL
WBC: 26.1 K/uL — ABNORMAL HIGH (ref 4.0–10.5)
nRBC: 0 % (ref 0.0–0.2)

## 2024-07-17 LAB — BLOOD GAS, VENOUS
Acid-base deficit: 5.2 mmol/L — ABNORMAL HIGH (ref 0.0–2.0)
Bicarbonate: 23.8 mmol/L (ref 20.0–28.0)
Drawn by: 61327
O2 Saturation: 29.3 %
Patient temperature: 37.3
pCO2, Ven: 62 mmHg — ABNORMAL HIGH (ref 44–60)
pH, Ven: 7.2 — ABNORMAL LOW (ref 7.25–7.43)
pO2, Ven: <31 mmHg — CL (ref 32–45)

## 2024-07-17 LAB — GLUCOSE, CAPILLARY: Glucose-Capillary: 70 mg/dL (ref 70–99)

## 2024-07-17 LAB — PROTIME-INR
INR: 1.5 — ABNORMAL HIGH (ref 0.8–1.2)
Prothrombin Time: 18.7 s — ABNORMAL HIGH (ref 11.4–15.2)

## 2024-07-17 LAB — LACTIC ACID, PLASMA
Lactic Acid, Venous: 4.2 mmol/L (ref 0.5–1.9)
Lactic Acid, Venous: 4.7 mmol/L (ref 0.5–1.9)
Lactic Acid, Venous: 5.3 mmol/L (ref 0.5–1.9)

## 2024-07-17 LAB — MRSA NEXT GEN BY PCR, NASAL: MRSA by PCR Next Gen: NOT DETECTED

## 2024-07-17 MED ORDER — LACTATED RINGERS IV BOLUS
1000.0000 mL | Freq: Once | INTRAVENOUS | Status: AC
  Administered 2024-07-17: 1000 mL via INTRAVENOUS

## 2024-07-17 MED ORDER — SODIUM CHLORIDE 0.9 % IV SOLN
2.0000 g | Freq: Once | INTRAVENOUS | Status: AC
  Administered 2024-07-17: 2 g via INTRAVENOUS
  Filled 2024-07-17: qty 12.5

## 2024-07-17 MED ORDER — SODIUM CHLORIDE 0.9 % IV BOLUS
1000.0000 mL | Freq: Once | INTRAVENOUS | Status: AC
  Administered 2024-07-17: 1000 mL via INTRAVENOUS

## 2024-07-17 MED ORDER — IPRATROPIUM BROMIDE 0.02 % IN SOLN
RESPIRATORY_TRACT | Status: AC
  Administered 2024-07-17: 0.5 mg
  Filled 2024-07-17: qty 2.5

## 2024-07-17 MED ORDER — ONDANSETRON HCL 4 MG/2ML IJ SOLN
4.0000 mg | Freq: Four times a day (QID) | INTRAMUSCULAR | Status: DC | PRN
  Administered 2024-07-18: 4 mg via INTRAVENOUS
  Filled 2024-07-17: qty 2

## 2024-07-17 MED ORDER — FLUCONAZOLE IN SODIUM CHLORIDE 200-0.9 MG/100ML-% IV SOLN
200.0000 mg | INTRAVENOUS | Status: AC
Start: 2024-07-17 — End: 2024-07-31
  Administered 2024-07-18 – 2024-07-23 (×7): 200 mg via INTRAVENOUS
  Filled 2024-07-17 (×9): qty 100

## 2024-07-17 MED ORDER — VANCOMYCIN HCL 2000 MG/400ML IV SOLN
2000.0000 mg | Freq: Once | INTRAVENOUS | Status: AC
  Administered 2024-07-17: 2000 mg via INTRAVENOUS
  Filled 2024-07-17: qty 400

## 2024-07-17 MED ORDER — METHYLPREDNISOLONE SODIUM SUCC 125 MG IJ SOLR
125.0000 mg | Freq: Once | INTRAMUSCULAR | Status: AC
Start: 2024-07-18 — End: 2024-07-17
  Administered 2024-07-17: 125 mg via INTRAVENOUS
  Filled 2024-07-17: qty 2

## 2024-07-17 MED ORDER — NOREPINEPHRINE 4 MG/250ML-% IV SOLN
0.0000 ug/min | INTRAVENOUS | Status: DC
  Administered 2024-07-17: 2 ug/min via INTRAVENOUS

## 2024-07-17 MED ORDER — PANTOPRAZOLE SODIUM 40 MG IV SOLR
40.0000 mg | Freq: Two times a day (BID) | INTRAVENOUS | Status: DC
  Administered 2024-07-18 – 2024-07-24 (×14): 40 mg via INTRAVENOUS
  Filled 2024-07-17 (×14): qty 10

## 2024-07-17 MED ORDER — DEXTROSE 10 % IV SOLN
INTRAVENOUS | Status: DC
  Filled 2024-07-17: qty 1000

## 2024-07-17 MED ORDER — CHLORHEXIDINE GLUCONATE CLOTH 2 % EX PADS
6.0000 | MEDICATED_PAD | Freq: Every day | CUTANEOUS | Status: DC
  Administered 2024-07-18 – 2024-07-24 (×7): 6 via TOPICAL

## 2024-07-17 MED ORDER — ZINC OXIDE 40 % EX OINT
TOPICAL_OINTMENT | Freq: Two times a day (BID) | CUTANEOUS | Status: DC
  Administered 2024-07-19 – 2024-07-20 (×2): 1 via TOPICAL
  Filled 2024-07-17: qty 57

## 2024-07-17 MED ORDER — SODIUM CHLORIDE 0.9 % IV SOLN
2.0000 g | Freq: Once | INTRAVENOUS | Status: AC
  Administered 2024-07-17: 2 g via INTRAVENOUS
  Filled 2024-07-17: qty 20

## 2024-07-17 MED ORDER — SODIUM CHLORIDE 0.9 % IV SOLN: 2.0000 g | INTRAVENOUS | Status: DC

## 2024-07-17 MED ORDER — ALBUTEROL SULFATE (2.5 MG/3ML) 0.083% IN NEBU
INHALATION_SOLUTION | RESPIRATORY_TRACT | Status: AC
  Administered 2024-07-17: 5 mg
  Filled 2024-07-17: qty 6

## 2024-07-17 MED ORDER — LACTATED RINGERS IV SOLN
150.0000 mL/h | INTRAVENOUS | Status: DC
  Administered 2024-07-17: 150 mL/h via INTRAVENOUS

## 2024-07-17 MED ORDER — NOREPINEPHRINE 4 MG/250ML-% IV SOLN
0.0000 ug/min | INTRAVENOUS | Status: DC
  Administered 2024-07-17: 10 ug/min via INTRAVENOUS
  Administered 2024-07-18: 9 ug/min via INTRAVENOUS
  Filled 2024-07-17: qty 250

## 2024-07-17 MED ORDER — HEPARIN SODIUM (PORCINE) 5000 UNIT/ML IJ SOLN
5000.0000 [IU] | Freq: Three times a day (TID) | INTRAMUSCULAR | Status: DC
  Administered 2024-07-17 – 2024-07-24 (×21): 5000 [IU] via SUBCUTANEOUS
  Filled 2024-07-17 (×21): qty 1

## 2024-07-17 MED ORDER — ALBUMIN HUMAN 25 % IV SOLN
25.0000 g | Freq: Once | INTRAVENOUS | Status: AC
  Administered 2024-07-17: 25 g via INTRAVENOUS
  Filled 2024-07-17: qty 100

## 2024-07-17 MED ORDER — VASOPRESSIN 20 UNITS/100 ML INFUSION FOR SHOCK: 0.0000 [IU]/min | INTRAVENOUS | Status: DC

## 2024-07-17 MED ORDER — IPRATROPIUM-ALBUTEROL 0.5-2.5 (3) MG/3ML IN SOLN
3.0000 mL | RESPIRATORY_TRACT | Status: DC | PRN
  Administered 2024-07-18: 3 mL via RESPIRATORY_TRACT
  Filled 2024-07-17: qty 3

## 2024-07-17 MED ORDER — ACETAMINOPHEN 10 MG/ML IV SOLN
1000.0000 mg | Freq: Once | INTRAVENOUS | Status: AC
  Administered 2024-07-17: 1000 mg via INTRAVENOUS
  Filled 2024-07-17: qty 100

## 2024-07-17 MED ORDER — NOREPINEPHRINE 4 MG/250ML-% IV SOLN
INTRAVENOUS | Status: AC
  Administered 2024-07-17: 2 ug/min via INTRAVENOUS
  Filled 2024-07-17: qty 250

## 2024-07-17 MED ORDER — LACTATED RINGERS IV SOLN: INTRAVENOUS | Status: AC

## 2024-07-17 MED ORDER — ACETAMINOPHEN 10 MG/ML IV SOLN: 1000.0000 mg | Freq: Four times a day (QID) | INTRAVENOUS | Status: AC | PRN

## 2024-07-17 MED ORDER — SODIUM CHLORIDE 0.9 % IV SOLN
250.0000 mL | INTRAVENOUS | Status: AC
  Administered 2024-07-17: 250 mL via INTRAVENOUS

## 2024-07-17 MED ORDER — VANCOMYCIN HCL 750 MG/150ML IV SOLN: 750.0000 mg | INTRAVENOUS | Status: DC

## 2024-07-17 MED ORDER — LACTATED RINGERS IV SOLN: INTRAVENOUS | Status: DC

## 2024-07-17 MED ORDER — METHYLPREDNISOLONE SODIUM SUCC 125 MG IJ SOLR
60.0000 mg | INTRAMUSCULAR | Status: DC
Start: 2024-07-18 — End: 2024-07-18
  Administered 2024-07-18: 60 mg via INTRAVENOUS
  Filled 2024-07-17: qty 2

## 2024-07-17 MED ORDER — SODIUM CHLORIDE 0.9 % IV BOLUS
750.0000 mL | Freq: Once | INTRAVENOUS | Status: AC
  Administered 2024-07-17: 750 mL via INTRAVENOUS

## 2024-07-17 NOTE — ED Notes (Addendum)
 No changes. Alert, NAD, calm, passively interactive, attempted incomprehensible speech, resps e/u, tachypneic. Family x2 at [facility]. Family reports altered from baseline.

## 2024-07-17 NOTE — ED Notes (Signed)
 Taken to CT.

## 2024-07-17 NOTE — Progress Notes (Signed)
 Location:  [facility] Room Number: 69 D Place of Service:  SNF (31)   CODE STATUS: DNR  No Known Allergies  Chief Complaint  Patient presents with   Acute Visit    Change in status    HPI:    Past Medical History:  Diagnosis Date   Diverticulosis OCT 2010 TCS   SIGMOID COLON   Esophageal web 10/08/2008   DILATION 16 MM   Gastritis, Helicobacter pylori 10/08/2008   Rx. ABO for 10 days    GERD (gastroesophageal reflux disease)    Hip fracture, left (HCC)    2016   Hyperlipidemia    Hypertension    Restless legs syndrome     Past Surgical History:  Procedure Laterality Date   ABDOMINAL HYSTERECTOMY     COLONOSCOPY  OCT 2010   TORTUOUS, Sml IH, Tics, MULTIPLE SIMPLE ADENOMAS (<6MM)   HIP ARTHROPLASTY Left 11/19/2014   Procedure: LEFT PARTIAL HIP REPLACEMENT;  Surgeon: Taft FORBES Minerva, MD;  Location: [facility];  Service: Orthopedics;  Laterality: Left;   ORIF HIP FRACTURE  04/05/2012   Procedure: OPEN REDUCTION INTERNAL FIXATION HIP;  Surgeon: Taft FORBES Minerva, MD;  Location: [facility];  Service: Orthopedics;  Laterality: Right;   UPPER GASTROINTESTINAL ENDOSCOPY  AUG 2010   SAVARY   VESICOVAGINAL FISTULA CLOSURE W/ TAH  1966   bleeding     Social History   Socioeconomic History   Marital status: Widowed    Spouse name: Not on file   Number of children: Not on file   Years of education: Not on file   Highest education level: Not on file  Occupational History   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 agent    Occupation: retired   Tobacco Use   Smoking status: Never   Smokeless tobacco: Never  Vaping Use   Vaping status: Never Used  Substance and Sexual Activity   Alcohol use: No   Drug use: No   Sexual activity: Not Currently  Other Topics Concern   Not on file  Social History Narrative   [facility] term resident of [facility]    Social Drivers of Health   Financial Resource Strain: Low Risk  (07/14/2018)   Overall Financial  Resource Strain (CARDIA)    Difficulty of Paying Living Expenses: Not hard at all  Food Insecurity: No Food Insecurity (07/14/2018)   Hunger Vital Sign    Worried About Running Out of Food in the Last Year: Never true    Ran Out of Food in the Last Year: Never true  Transportation Needs: No Transportation Needs (07/14/2018)   PRAPARE - Administrator, Civil Service (Medical): No    Lack of Transportation (Non-Medical): No  Physical Activity: Insufficiently Active (07/14/2018)   Exercise Vital Sign    Days of Exercise per Week: 4 days    Minutes of Exercise per Session: 20 min  Stress: Stress Concern Present (07/14/2018)   [employer] of Occupational Health - Occupational Stress Questionnaire    Feeling of Stress : To some extent  Social Connections: Moderately Isolated (07/14/2018)   Social Connection and Isolation Panel    Frequency of Communication with Friends and Family: Twice a week    Frequency of Social Gatherings with Friends and Family: Once a week    Attends Religious Services: Never    Database administrator or Organizations: No    Attends Banker Meetings: Never    Marital Status: Widowed  Catering manager  Violence: Not At Risk (07/14/2018)   Humiliation, Afraid, Rape, and Kick questionnaire    Fear of Current or Ex-Partner: No    Emotionally Abused: No    Physically Abused: No    Sexually Abused: No   Family History  Problem Relation Age of Onset   Cancer Mother        bladder    Diabetes Mother    Diabetes Father    Emphysema Father    GER disease Brother    Colon cancer Neg Hx    Colon polyps Neg Hx       VITAL SIGNS BP 100/60   Pulse 64   Temp 97.6 F (36.4 C)   Resp 18   Ht 5' 8 (1.727 m)   Wt 208 lb 9.6 oz (94.6 kg)   SpO2 93%   BMI 31.72 kg/m   Outpatient Encounter Medications as of 07/17/2024  Medication Sig   acetaminophen  (TYLENOL ) 650 MG CR tablet Take 650 mg by mouth every 6 (six) hours.   ascorbic acid  (VITAMIN C) 500 MG tablet Take 500 mg by mouth 2 (two) times daily.   aspirin  81 MG chewable tablet Chew 81 mg by mouth daily.   Balsam [place]-Castor Oil (VENELEX) OINT Apply topically. Apply to sacrum, bilateral buttocks and coccyx qshift for MAD & excoriation.   bisacodyl  (DULCOLAX) 10 MG suppository Place 10 mg rectally as needed.   Carbidopa-Levodopa ER (SINEMET CR) 25-100 MG tablet controlled release Take 1 tablet by mouth 3 (three) times daily.   cholecalciferol  (VITAMIN D3) 25 MCG (1000 UNIT) tablet Take 1,000 Units by mouth daily. Special Instructions: nutritional support for wound management Once A Day 09:00 AM   cyanocobalamin  1000 MCG tablet Take 1,000 mcg by mouth daily. Special Instructions: nutritional support for wound management Once A Day 09:00 AM   feeding supplement (ENSURE PLUS HIGH PROTEIN) LIQD Take 237 mLs by mouth 2 (two) times daily between meals.   FLUoxetine  (PROZAC ) 40 MG capsule Take 40 mg by mouth daily.   levETIRAcetam  (KEPPRA ) 250 MG tablet Take 250 mg by mouth 2 (two) times daily.   Melatonin 5 MG TABS Take 5 mg by mouth at bedtime.   Multiple Vitamins-Minerals (CENTRUM SILVER) tablet Take 1 tablet by mouth daily.   NON FORMULARY Diet type: DYSPHAGIA 3 / THIN LIQUIDS.  Add soft sandwich to Lunch and Dinner trays   nystatin (MYCOSTATIN) 100000 UNIT/ML suspension Take by mouth 4 (four) times daily. 10cc; oral Special Instructions: Use oral swab to paint nystatin on tongue four times daily for oral thrush   ondansetron  (ZOFRAN ) 8 MG tablet Take 8 mg by mouth every 6 (six) hours as needed for nausea or vomiting.   potassium chloride  (KLOR-CON ) 10 MEQ tablet Take 10 mEq by mouth daily.   sennosides-docusate sodium  (SENOKOT-S) 8.6-50 MG tablet Take 2 tablets by mouth in the morning and at bedtime. For Constipation   Wound Cleansers (VASHE WOUND) 0.033 % SOLN Apply topically daily.   Salicylic Acid (CORN REMOVER ONE STEP) 40 % PADS Apply 1 patch topically at bedtime.  (Patient not taking: Reported on 07/17/2024)   No facility-administered encounter medications on file as of 07/17/2024.     SIGNIFICANT DIAGNOSTIC EXAMS       ASSESSMENT/ PLAN:     Barnie Seip NP [facility]  Contact [phone] Monday through Friday 8am- 5pm  After hours call [phone]

## 2024-07-17 NOTE — Consult Note (Signed)
 WOC Nurse Consult Note: per chart review patient is a long term resident at a facility, NP made mention of a Stage 2 to sacrum in note 06/23/2024  Reason for Consult: sacral wound  Wound type: Stage 3 vs Stage 4 Pressure Injury  Pressure Injury POA: Yes Measurement: see nursing flowsheet  Wound bed: 50% clean red 50% tan   Drainage (amount, consistency, odor) see nursing flowsheet, not purulent or foul smelling per MD note  Periwound: some denuded skin  Dressing procedure/placement/frequency: Cleanse sacral wound with Vashe wound cleanser Soila [phone]) do not rinse and allow to air dry. Using a Q tip applicator insert Vashe moistened Kerlix into wound bed 2 times daily, cover with dry gauze and ABD pad or silicone foam whichever is preferred.   Will write for a thin layer of Desitin to surrounding intact skin 2 times daily with wound care.    Patient should remain on a low air loss mattress once moved out of ICU setting.    Patient should continue wound care at facility at discharge.   POC discussed with bedside nurse. WOC team will not follow. Re-consult if further needs arise.   Thank you,    Powell Bar MSN, RN-BC, [employer]

## 2024-07-17 NOTE — ED Notes (Signed)
 No urine output yet to collect for urinalysis

## 2024-07-17 NOTE — Progress Notes (Signed)
 Patient vomited shortly after applying BiPAP> Therapist removed mask and suctioned vomitus ----- Delores stool looking from mouth. Nebulizer given after fir wheezing.

## 2024-07-17 NOTE — Plan of Care (Signed)

## 2024-07-17 NOTE — ED Provider Notes (Signed)
 [facility] AT [facility] Provider Note   CSN: [id number] Arrival date & time: 07/17/24  1128     Patient presents with: Altered Mental Status   Alejandra Cruz is a 89 y.o. female.    Altered Mental Status Associated symptoms: no abdominal pain, no fever, no palpitations, no rash, no seizures and no vomiting     Presents with altered mental status.  According to daughter at bedside, may be acting little bit more altered than baseline.  Patient has a history of dementia.  Patient was found to have a fever today.  Patient has a large sacral ulcer wound that is being followed at her facility has been treated.  Is being packed.  Daughter was called today and stated that she had a temperature of 101 degrees at the facility so was sent into the ED for further evaluation.  Episode of vomiting yesterday.  Otherwise, she is not aware of any kind of acute changes in the patient.  Patient is usually only oriented x 1 and she is around her baseline at this time.     Previous medical history reviewed : Patient followed up in primary care office in September 2025.  History of chronic pain.  Managing with agitation.   Prior to Admission medications   Medication Sig Start Date End Date Taking? Authorizing Provider  acetaminophen  (TYLENOL ) 650 MG CR tablet Take 650 mg by mouth every 6 (six) hours.    [provider]  ascorbic acid (VITAMIN C) 500 MG tablet Take 500 mg by mouth 2 (two) times daily.    [provider]  aspirin  81 MG chewable tablet Chew 81 mg by mouth daily.    [provider]  [facility]) OINT Apply topically. Apply to sacrum, bilateral buttocks and coccyx qshift for MAD & excoriation.    [provider]  bisacodyl  (DULCOLAX) 10 MG suppository Place 10 mg rectally as needed.    [provider]  Carbidopa-Levodopa ER (SINEMET CR) 25-100 MG tablet controlled release Take 1 tablet by mouth 3  (three) times daily.    [provider]  cholecalciferol  (VITAMIN D3) 25 MCG (1000 UNIT) tablet Take 1,000 Units by mouth daily. Special Instructions: nutritional support for wound management Once A Day 09:00 AM    [provider]  cyanocobalamin  1000 MCG tablet Take 1,000 mcg by mouth daily. Special Instructions: nutritional support for wound management Once A Day 09:00 AM    [provider]  feeding supplement (ENSURE PLUS HIGH PROTEIN) LIQD Take 237 mLs by mouth 2 (two) times daily between meals.    [provider]  FLUoxetine  (PROZAC ) 40 MG capsule Take 40 mg by mouth daily.    [provider]  levETIRAcetam  (KEPPRA ) 250 MG tablet Take 250 mg by mouth 2 (two) times daily.    [provider]  Melatonin 5 MG TABS Take 5 mg by mouth at bedtime.    [provider]  Multiple Vitamins-Minerals (CENTRUM SILVER) tablet Take 1 tablet by mouth daily.    [provider]  NON FORMULARY Diet type: DYSPHAGIA 3 / THIN LIQUIDS.  Add soft sandwich to Lunch and Dinner trays 09/11/18   [provider]  nystatin (MYCOSTATIN) 100000 UNIT/ML suspension Take by mouth 4 (four) times daily. 10cc; oral Special Instructions: Use oral swab to paint nystatin on tongue four times daily for oral thrush    [provider]  ondansetron  (ZOFRAN ) 8 MG tablet Take 8 mg  by mouth every 6 (six) hours as needed for nausea or vomiting.    [provider]  potassium chloride  (KLOR-CON ) 10 MEQ tablet Take 10 mEq by mouth daily.    [provider]  Salicylic Acid (CORN REMOVER ONE STEP) 40 % PADS Apply 1 patch topically at bedtime. Patient not taking: Reported on 07/17/2024    [provider]  sennosides-docusate sodium  (SENOKOT-S) 8.6-50 MG tablet Take 2 tablets by mouth in the morning and at bedtime. For Constipation 02/10/20   [provider]  Wound Cleansers (VASHE WOUND) 0.033 % SOLN Apply topically daily.     [provider]    Allergies: Patient has no known allergies.    Review of Systems  Constitutional:  Negative for chills and fever.  HENT:  Negative for ear pain and sore throat.   Eyes:  Negative for pain and visual disturbance.  Respiratory:  Negative for cough and shortness of breath.   Cardiovascular:  Negative for chest pain and palpitations.  Gastrointestinal:  Negative for abdominal pain and vomiting.  Genitourinary:  Negative for dysuria and hematuria.  Musculoskeletal:  Negative for arthralgias and back pain.  Skin:  Negative for color change and rash.  Neurological:  Negative for seizures and syncope.  All other systems reviewed and are negative.   Updated Vital Signs BP (!) 142/115   Pulse (!) 126   Temp (!) 100.4 F (38 C) (Axillary)   Resp (!) 39   Ht 5' 8 (1.727 m)   Wt 94 kg   SpO2 95%   BMI 31.51 kg/m   Physical Exam Vitals and nursing note reviewed.  Constitutional:      General: She is not in acute distress.    Appearance: She is well-developed.  HENT:     Head: Normocephalic and atraumatic.  Eyes:     Conjunctiva/sclera: Conjunctivae normal.  Cardiovascular:     Rate and Rhythm: Normal rate and regular rhythm.     Heart sounds: No murmur heard. Pulmonary:     Effort: Pulmonary effort is normal. No respiratory distress.     Breath sounds: Normal breath sounds.  Abdominal:     Palpations: Abdomen is soft.     Tenderness: There is no abdominal tenderness.  Musculoskeletal:        General: No swelling.     Cervical back: Neck supple.       Legs:  Skin:    General: Skin is warm and dry.     Capillary Refill: Capillary refill takes less than 2 seconds.  Neurological:     Mental Status: She is alert.     Comments: Alert to self ( baseline )   Psychiatric:        Mood and Affect: Mood normal.     (all labs ordered are listed, but only abnormal results are displayed) Labs Reviewed  LACTIC ACID, PLASMA - Abnormal; Notable for the  following components:      Result Value   Lactic Acid, Venous 4.2 (*)    All other components within normal limits  LACTIC ACID, PLASMA - Abnormal; Notable for the following components:   Lactic Acid, Venous 4.7 (*)    All other components within normal limits  COMPREHENSIVE METABOLIC PANEL WITH GFR - Abnormal; Notable for the following components:   Sodium 147 (*)    Potassium 5.2 (*)    Glucose, Bld 106 (*)    BUN 42 (*)    Creatinine, Ser 2.22 (*)    AST  51 (*)    GFR, Estimated 21 (*)    Anion gap 16 (*)    All other components within normal limits  CBC WITH DIFFERENTIAL/PLATELET - Abnormal; Notable for the following components:   WBC 26.1 (*)    Neutro Abs 18.0 (*)    Monocytes Absolute 1.3 (*)    Basophils Absolute 0.3 (*)    Abs Immature Granulocytes 5.20 (*)    All other components within normal limits  PROTIME-INR - Abnormal; Notable for the following components:   Prothrombin Time 18.7 (*)    INR 1.5 (*)    All other components within normal limits  CULTURE, BLOOD (ROUTINE X 2)  CULTURE, BLOOD (ROUTINE X 2)  MRSA NEXT GEN BY PCR, NASAL  URINALYSIS, W/ REFLEX TO CULTURE (INFECTION SUSPECTED)    EKG: EKG Interpretation Date/Time:  Friday July 17 2024 11:48:01 EDT Ventricular Rate:  99 PR Interval:  185 QRS Duration:  95 QT Interval:  393 QTC Calculation: 505 R Axis:   10  Text Interpretation: Sinus rhythm Prolonged QT interval Confirmed by Simon Rea [phone]) on 07/17/2024 11:53:46 AM  Radiology: CT CHEST ABDOMEN PELVIS WO CONTRAST Result Date: 07/17/2024 EXAM: CT CHEST, ABDOMEN AND PELVIS WITHOUT CONTRAST 07/17/2024 01:22:39 PM TECHNIQUE: CT of the chest, abdomen and pelvis was performed without the administration of intravenous contrast. Multiplanar reformatted images are provided for review. Automated exposure control, iterative reconstruction, and/or weight based adjustment of the mA/kV was utilized to reduce the radiation dose to as low as reasonably  achievable. COMPARISON: None available. CLINICAL HISTORY: Sepsis workup. Vomiting. Also has decubitus ulcer at the sacral area. Patient brought in by EMS from [facility] for AMS/lethargy. Per EMS, nursing staff stated patient has a change in her cognition. Wanted her seen for sepsis. Stated she had a temperature of 100.9 and sats in 70s. Patient has a stage 4 decubitus. Patient very heavy mouth breathing, hard to keep still. FINDINGS: CHEST: MEDIASTINUM AND LYMPH NODES: Heart is unremarkable. No pericardial fluid. The central airways are clear. No mediastinal, hilar or axillary lymphadenopathy. LUNGS AND PLEURA: No pneumonia. No pleural fluid. No pneumothorax. ABDOMEN AND PELVIS: LIVER: The liver is unremarkable. GALLBLADDER AND BILE DUCTS: Gallbladder is unremarkable. No biliary ductal dilatation. SPLEEN: No acute abnormality. PANCREAS: No acute abnormality. ADRENAL GLANDS: No acute abnormality. KIDNEYS, URETERS AND BLADDER: No stones in the kidneys or ureters. No hydronephrosis. No perinephric or periureteral stranding. Bladder collapse around the foley catheter. GI AND BOWEL: Stomach demonstrates no acute abnormality. There is no bowel obstruction. Appendix not identified. REPRODUCTIVE ORGANS: No acute abnormality. PERITONEUM AND RETROPERITONEUM: No ascites. No free air. VASCULATURE: Aorta is normal in caliber. ABDOMINAL AND PELVIS LYMPH NODES: No lymphadenopathy. BONES AND SOFT TISSUES: Bilateral hip prosthetics. Skin ulceration extends to the lower sacrum on image 105 of series 2. No organized fluid collection. No CT evidence of acute osteomyelitis. IMPRESSION: 1. No acute abnormality of the chest, abdomen, and pelvis. 2. Lower sacrum decubitus ulcer without evidence of abscess or acute osteomyelitis by CT imaging Electronically signed by: Norleen Boxer MD 07/17/2024 01:58 PM EDT RP Workstation: [id number]   CT Head Wo Contrast Result Date: 07/17/2024 EXAM: CT HEAD WITHOUT CONTRAST 07/17/2024  01:22:39 PM TECHNIQUE: CT of the head was performed without the administration of intravenous contrast. Automated exposure control, iterative reconstruction, and/or weight based adjustment of the mA/kV was utilized to reduce the radiation dose to as low as reasonably achievable. COMPARISON: None available. CLINICAL HISTORY: AMS. Eval for bleed or other pathology.  Likely metabolic. Patient brought in by [facility] from [facility] for AMS/ lethargy. Per EMS nursing staff, patient has a change in her cognition. Wanted her seen for sepsis. Stated she had a temp of 100.9 and sats in 70s. Patient has a stage 4 decubitus. Patient very heavy mouth breathing, hard to keep still. FINDINGS: BRAIN AND VENTRICLES: No acute hemorrhage. No evidence of acute infarct. No hydrocephalus. No extra-axial collection. No mass effect or midline shift. Dense basal ganglia calcifications are again noted. Periventricular and subcortical white matter hypoattenuation has progressed since the prior exam. This most likely reflects the sequelae of chronic microvascular ischemia. Chronic calcifications are present within the dentate nuclei of the cerebellum bilaterally. ORBITS: Right lens replacement is noted. SINUSES: No acute abnormality. SOFT TISSUES AND SKULL: No acute soft tissue abnormality. No skull fracture. IMPRESSION: 1. No acute intracranial abnormality. 2. Progression of periventricular and subcortical white matter hypoattenuation, likely reflecting sequelae of chronic microvascular ischemia. Electronically signed by: Lonni Necessary MD 07/17/2024 01:29 PM EDT RP Workstation: [id number]   DG Chest Port 1 View Result Date: 07/17/2024 EXAM: 1 VIEW(S) XRAY OF THE CHEST 07/17/2024 12:17:00 PM COMPARISON: None available. CLINICAL HISTORY: Questionable sepsis, altered mental status, lethargy, change in cognition, fever 100.9, sats in 70s, stage 4 decubitus. FINDINGS: LUNGS AND PLEURA: Mild pulmonary edema, improved. No focal  pulmonary opacity. No pleural effusion. No pneumothorax. HEART AND MEDIASTINUM: Cardiomegaly. Aortic calcification. BONES AND SOFT TISSUES: Severe degenerative changes in left shoulder. IMPRESSION: 1. Mild pulmonary edema, improved. 2. Cardiomegaly. 3. Aortic atherosclerosis. 4. Severe left shoulder osteoarthritis. Electronically signed by: Katheleen Faes MD 07/17/2024 12:39 PM EDT RP Workstation: [id number]     Procedures   Medications Ordered in the ED  vancomycin  (VANCOREADY) IVPB 2000 mg/400 mL (2,000 mg Intravenous New Bag/Given 07/17/24 1418)  vancomycin  (VANCOREADY) IVPB 750 mg/150 mL (has no administration in time range)  lactated ringers  infusion (has no administration in time range)  heparin injection 5,000 Units (has no administration in time range)  ceFEPIme (MAXIPIME) 2 g in sodium chloride  0.9 % 100 mL IVPB (has no administration in time range)  Chlorhexidine  Gluconate Cloth 2 % PADS 6 each (has no administration in time range)  sodium chloride  0.9 % bolus 1,000 mL (0 mLs Intravenous Stopped 07/17/24 1243)  cefTRIAXone (ROCEPHIN) 2 g in sodium chloride  0.9 % 100 mL IVPB (0 g Intravenous Stopped 07/17/24 1243)  acetaminophen  (OFIRMEV ) IV 1,000 mg (0 mg Intravenous Stopped 07/17/24 1244)  sodium chloride  0.9 % bolus 1,000 mL (0 mLs Intravenous Stopped 07/17/24 1355)  ceFEPIme (MAXIPIME) 2 g in sodium chloride  0.9 % 100 mL IVPB (0 g Intravenous Stopped 07/17/24 1510)                                    Medical Decision Making Amount and/or Complexity of Data Reviewed Labs: ordered. Radiology: ordered.  Risk Prescription drug management. Decision regarding hospitalization.     HPI:   Presents with altered mental status.  According to daughter at bedside, may be acting little bit more altered than baseline.  Patient has a history of dementia.  Patient was found to have a fever today.  Patient has a large sacral ulcer wound that is being followed at her facility has been  treated.  Is being packed.  Daughter was called today and stated that she had a temperature of 101 degrees at the facility so was sent into the ED  for further evaluation.  Episode of vomiting yesterday.  Otherwise, she is not aware of any kind of acute changes in the patient.  Patient is usually only oriented x 1 and she is around her baseline at this time.     Previous medical history reviewed : Patient followed up in primary care office in September 2025.  History of chronic pain.  Managing with agitation.  MDM:    Upon exam, patient febrile.  Temperature of 100.3.  Maps appropriate.  No tachypnea or tachycardia.   Initial concern for source is probable cervical wound.  Patient has stage IV sacral wound.  No significant purulent discharge but was deep.  Also obtain UA as well as chest x-ray.  Will attain CT scan of the abdomen as well given episode of vomiting yesterday to assess for any kind of evidence of ileus obstruction or other acute intra-abdominal pathology.  Will assess UA for UTI if there could be source.  Room air so feeling is unlikely to be any kind of pneumonia but will obtain chest x-ray.  Meet sepsis criteria.  Leukocytosis.  Left shift.  AKI.  Elevated lactic acid.  Patient received 2 L of normal saline with uptrending lactic acid.  Initially started on ceftriaxone but expanded to both cefepime and vancomycin .  I think most likely source is the large sacral ulcer stage IV.  Did obtain CT scan chest abdomen pelvis without contrast to assess any kind of obvious source.  Could not use contrast given patient's decreased GFR of only 20.  No ileus obstruction.  Did show the decubitus ulcer but no abscess or any kind of fluid to drain.   Patient remained tachycardic.  Some tachypnea as well.  Remained febrile.  Spoke to the family about the patient.  Explained to them that she is very sick and there is a good chance that she could get sicker and subsequently passed away.  They have a  DNR on file and would like to keep it this way at this time.  Patient admitted to stepdown unit.   Interventions: Ceftriaxone, Cefepime, vancomycin , 2 L NS  EKG Interpreted by Me: sinus tachycardia    Cardiac Tele Interpreted by Me: sinus    I have independently interpreted the CXR  and CT  images and agree with the radiologist finding   Social Determinant of Health:    Disposition and Follow Up: admit    CRITICAL CARE Performed by: Lavonia LOISE Pat   Total critical care time: 45 minutes  Critical care time was exclusive of separately billable procedures and treating other patients.  Critical care was necessary to treat or prevent imminent or life-threatening deterioration.  Critical care was time spent personally by me on the following activities: development of treatment plan with patient and/or surrogate as well as nursing, discussions with consultants, evaluation of patient's response to treatment, examination of patient, obtaining history from patient or surrogate, ordering and performing treatments and interventions, ordering and review of laboratory studies, ordering and review of radiographic studies, pulse oximetry and re-evaluation of patient's condition.     Final diagnoses:  Sepsis with acute renal failure without septic shock, due to unspecified organism, unspecified acute renal failure type (HCC)  Pressure injury of back, stage 4 [facility])    ED Discharge Orders     None          Pat Lavonia LOISE, MD 07/17/24 1545

## 2024-07-17 NOTE — H&P (Addendum)
 History and Physical    Patient: Alejandra Cruz FMW:[id number] DOB: 1933-08-02 DOA: 07/17/2024 DOS: the patient was seen and examined on 07/17/2024 PCP: Landy Barnie RAMAN, NP  Patient coming from: SNF  Chief Complaint:  Chief Complaint  Patient presents with   Altered Mental Status   HPI: Alejandra Cruz is a 89 y.o. female with medical history significant of past medical history significant for dementia, hypertension, hyperlipidemia, restless leg syndrome, diverticulosis who is resident of skilled nursing facility presented to the emergency department for evaluation of altered mental status.  Patient is a poor historian, most of the history is obtained from ED, EMS, prior records.  Patient has large sacral decubitus ulcer followed at her facility, noted febrile, had an episode of vomiting at the facility transferred to ED for further management evaluation.  Patient has a low DNR form and pink MOST form accompanied.  Upon arrival to the emergency department patient was noted to have fever of 100.7, tachycardic, tachypneic, low blood pressures noted.  Laboratory workup showed sodium 147, potassium 5.2, creatinine 2.22, lactic acid 4.2 which increased to 4.7, white count 26.1.  ED provider started sepsis protocol, sh without contrast e got 2 L of IV fluids, broad-spectrum IV antibiotics CT scan of abdomen pelvis showed no acute abnormality of chest abdomen, pelvis, lower sacral decubitus ulcer without evidence of abscess or acute osteomyelitis. ED provider requested [facility] service for inpatient admission and further management accordingly for sepsis secondary to infected decubitus ulcer. During my exam she is sleeping, rhonchorous breath sounds, tachypnea, tachycardia noted.  Patient's son at bedside with whom I discussed care plan and her critical condition.  Review of Systems: unable to review all systems due to the inability of the patient to answer questions. Past Medical History:  Diagnosis Date    Diverticulosis OCT 2010 TCS   SIGMOID COLON   Esophageal web 10/08/2008   DILATION 16 MM   Gastritis, Helicobacter pylori 10/08/2008   Rx. ABO for 10 days    GERD (gastroesophageal reflux disease)    Hip fracture, left (HCC)    2016   Hyperlipidemia    Hypertension    Restless legs syndrome    Past Surgical History:  Procedure Laterality Date   ABDOMINAL HYSTERECTOMY     COLONOSCOPY  OCT 2010   TORTUOUS, Sml IH, Tics, MULTIPLE SIMPLE ADENOMAS (<6MM)   HIP ARTHROPLASTY Left 11/19/2014   Procedure: LEFT PARTIAL HIP REPLACEMENT;  Surgeon: Taft FORBES Minerva, MD;  Location: [facility];  Service: Orthopedics;  Laterality: Left;   ORIF HIP FRACTURE  04/05/2012   Procedure: OPEN REDUCTION INTERNAL FIXATION HIP;  Surgeon: Taft FORBES Minerva, MD;  Location: [facility];  Service: Orthopedics;  Laterality: Right;   UPPER GASTROINTESTINAL ENDOSCOPY  AUG 2010   SAVARY   VESICOVAGINAL FISTULA CLOSURE W/ TAH  1966   bleeding    Social History:  reports that she has never smoked. She has never used smokeless tobacco. She reports that she does not drink alcohol and does not use drugs.  No Known Allergies  Family History  Problem Relation Age of Onset   Cancer Mother        bladder    Diabetes Mother    Diabetes Father    Emphysema Father    GER disease Brother    Colon cancer Neg Hx    Colon polyps Neg Hx     Prior to Admission medications   Medication Sig Start Date End Date Taking? Authorizing Provider  acetaminophen  (TYLENOL )  650 MG CR tablet Take 650 mg by mouth every 6 (six) hours.    [provider]  ascorbic acid (VITAMIN C) 500 MG tablet Take 500 mg by mouth 2 (two) times daily.    [provider]  aspirin  81 MG chewable tablet Chew 81 mg by mouth daily.    [provider]  Victorio Spanner Oil [facility]) OINT Apply topically. Apply to sacrum, bilateral buttocks and coccyx qshift for MAD & excoriation.    [provider]  bisacodyl  (DULCOLAX) 10  MG suppository Place 10 mg rectally as needed.    [provider]  Carbidopa-Levodopa ER (SINEMET CR) 25-100 MG tablet controlled release Take 1 tablet by mouth 3 (three) times daily.    [provider]  cholecalciferol  (VITAMIN D3) 25 MCG (1000 UNIT) tablet Take 1,000 Units by mouth daily. Special Instructions: nutritional support for wound management Once A Day 09:00 AM    [provider]  cyanocobalamin  1000 MCG tablet Take 1,000 mcg by mouth daily. Special Instructions: nutritional support for wound management Once A Day 09:00 AM    [provider]  feeding supplement (ENSURE PLUS HIGH PROTEIN) LIQD Take 237 mLs by mouth 2 (two) times daily between meals.    [provider]  FLUoxetine  (PROZAC ) 40 MG capsule Take 40 mg by mouth daily.    [provider]  levETIRAcetam  (KEPPRA ) 250 MG tablet Take 250 mg by mouth 2 (two) times daily.    [provider]  Melatonin 5 MG TABS Take 5 mg by mouth at bedtime.    [provider]  Multiple Vitamins-Minerals (CENTRUM SILVER) tablet Take 1 tablet by mouth daily.    [provider]  NON FORMULARY Diet type: DYSPHAGIA 3 / THIN LIQUIDS.  Add soft sandwich to Lunch and Dinner trays 09/11/18   [provider]  nystatin (MYCOSTATIN) 100000 UNIT/ML suspension Take by mouth 4 (four) times daily. 10cc; oral Special Instructions: Use oral swab to paint nystatin on tongue four times daily for oral thrush    [provider]  ondansetron  (ZOFRAN ) 8 MG tablet Take 8 mg by mouth every 6 (six) hours as needed for nausea or vomiting.    [provider]  potassium chloride  (KLOR-CON ) 10 MEQ tablet Take 10 mEq by mouth daily.    [provider]  Salicylic Acid (CORN REMOVER ONE STEP) 40 % PADS Apply 1 patch topically at bedtime. Patient not taking: Reported on 07/17/2024    [provider]  sennosides-docusate sodium  (SENOKOT-S) 8.6-50 MG tablet Take  2 tablets by mouth in the morning and at bedtime. For Constipation 02/10/20   [provider]  Wound Cleansers (VASHE WOUND) 0.033 % SOLN Apply topically daily.    [provider]    Physical Exam: Vitals:   07/17/24 1350 07/17/24 1400 07/17/24 1415 07/17/24 1428  BP: 132/83 113/67 123/79   Pulse: (!) 109 (!) 118 (!) 118   Resp: (!) 37 (!) 34 (!) 41   Temp:    (!) 100.4 F (38 C)  TempSrc:    Axillary  SpO2: 90% 100% 100%   Weight:      Height:       General - Elderly African American obese female, severe respiratory distress HEENT - PERRLA, EOMI, atraumatic head, non tender sinuses. Lung - Clear, basal rales, rhonchi, diffuse wheezes. Heart - S1, S2 heard, no murmurs, rubs, trace pedal edema. Abdomen - Soft, non tender, bowel sounds good Neuro -sleeping, lethargic, unable to do  full neuroexam. Skin - Warm and dry.  Data Reviewed:     Latest Ref Rng & Units 07/17/2024   11:54 AM 07/09/2024    6:25 AM 04/27/2024    6:00 AM  CBC  WBC 4.0 - 10.5 K/uL 26.1  6.6  5.2   Hemoglobin 12.0 - 15.0 g/dL 87.8  87.5  86.5   Hematocrit 36.0 - 46.0 % 37.6  39.3  41.6   Platelets 150 - 400 K/uL 217  234  180       Latest Ref Rng & Units 07/17/2024   11:54 AM 07/09/2024    6:25 AM 04/27/2024    6:00 AM  BMP  Glucose 70 - 99 mg/dL 893  82  81   BUN 8 - 23 mg/dL 42  27  32   Creatinine 0.44 - 1.00 mg/dL 7.77  9.44  9.43   Sodium 135 - 145 mmol/L 147  147  141   Potassium 3.5 - 5.1 mmol/L 5.2  3.9  3.8   Chloride 98 - 111 mmol/L 102  109  103   CO2 22 - 32 mmol/L 28  30  29    Calcium  8.9 - 10.3 mg/dL 9.1  8.5  8.6    CT CHEST ABDOMEN PELVIS WO CONTRAST Result Date: 07/17/2024 EXAM: CT CHEST, ABDOMEN AND PELVIS WITHOUT CONTRAST 07/17/2024 01:22:39 PM TECHNIQUE: CT of the chest, abdomen and pelvis was performed without the administration of intravenous contrast. Multiplanar reformatted images are provided for review. Automated exposure control, iterative reconstruction,  and/or weight based adjustment of the mA/kV was utilized to reduce the radiation dose to as low as reasonably achievable. COMPARISON: None available. CLINICAL HISTORY: Sepsis workup. Vomiting. Also has decubitus ulcer at the sacral area. Patient brought in by EMS from [facility] for AMS/lethargy. Per EMS, nursing staff stated patient has a change in her cognition. Wanted her seen for sepsis. Stated she had a temperature of 100.9 and sats in 70s. Patient has a stage 4 decubitus. Patient very heavy mouth breathing, hard to keep still. FINDINGS: CHEST: MEDIASTINUM AND LYMPH NODES: Heart is unremarkable. No pericardial fluid. The central airways are clear. No mediastinal, hilar or axillary lymphadenopathy. LUNGS AND PLEURA: No pneumonia. No pleural fluid. No pneumothorax. ABDOMEN AND PELVIS: LIVER: The liver is unremarkable. GALLBLADDER AND BILE DUCTS: Gallbladder is unremarkable. No biliary ductal dilatation. SPLEEN: No acute abnormality. PANCREAS: No acute abnormality. ADRENAL GLANDS: No acute abnormality. KIDNEYS, URETERS AND BLADDER: No stones in the kidneys or ureters. No hydronephrosis. No perinephric or periureteral stranding. Bladder collapse around the foley catheter. GI AND BOWEL: Stomach demonstrates no acute abnormality. There is no bowel obstruction. Appendix not identified. REPRODUCTIVE ORGANS: No acute abnormality. PERITONEUM AND RETROPERITONEUM: No ascites. No free air. VASCULATURE: Aorta is normal in caliber. ABDOMINAL AND PELVIS LYMPH NODES: No lymphadenopathy. BONES AND SOFT TISSUES: Bilateral hip prosthetics. Skin ulceration extends to the lower sacrum on image 105 of series 2. No organized fluid collection. No CT evidence of acute osteomyelitis. IMPRESSION: 1. No acute abnormality of the chest, abdomen, and pelvis. 2. Lower sacrum decubitus ulcer without evidence of abscess or acute osteomyelitis by CT imaging Electronically signed by: Norleen Boxer MD 07/17/2024 01:58 PM EDT RP Workstation:  [id number]   CT Head Wo Contrast Result Date: 07/17/2024 EXAM: CT HEAD WITHOUT CONTRAST 07/17/2024 01:22:39 PM TECHNIQUE: CT of the head was performed without the administration of intravenous contrast. Automated exposure control, iterative reconstruction, and/or weight based adjustment of the mA/kV was utilized to reduce the  radiation dose to as low as reasonably achievable. COMPARISON: None available. CLINICAL HISTORY: AMS. Eval for bleed or other pathology. Likely metabolic. Patient brought in by [facility] from [facility] for AMS/ lethargy. Per EMS nursing staff, patient has a change in her cognition. Wanted her seen for sepsis. Stated she had a temp of 100.9 and sats in 70s. Patient has a stage 4 decubitus. Patient very heavy mouth breathing, hard to keep still. FINDINGS: BRAIN AND VENTRICLES: No acute hemorrhage. No evidence of acute infarct. No hydrocephalus. No extra-axial collection. No mass effect or midline shift. Dense basal ganglia calcifications are again noted. Periventricular and subcortical white matter hypoattenuation has progressed since the prior exam. This most likely reflects the sequelae of chronic microvascular ischemia. Chronic calcifications are present within the dentate nuclei of the cerebellum bilaterally. ORBITS: Right lens replacement is noted. SINUSES: No acute abnormality. SOFT TISSUES AND SKULL: No acute soft tissue abnormality. No skull fracture. IMPRESSION: 1. No acute intracranial abnormality. 2. Progression of periventricular and subcortical white matter hypoattenuation, likely reflecting sequelae of chronic microvascular ischemia. Electronically signed by: Lonni Necessary MD 07/17/2024 01:29 PM EDT RP Workstation: [id number]   DG Chest Port 1 View Result Date: 07/17/2024 EXAM: 1 VIEW(S) XRAY OF THE CHEST 07/17/2024 12:17:00 PM COMPARISON: None available. CLINICAL HISTORY: Questionable sepsis, altered mental status, lethargy, change in cognition, fever 100.9,  sats in 70s, stage 4 decubitus. FINDINGS: LUNGS AND PLEURA: Mild pulmonary edema, improved. No focal pulmonary opacity. No pleural effusion. No pneumothorax. HEART AND MEDIASTINUM: Cardiomegaly. Aortic calcification. BONES AND SOFT TISSUES: Severe degenerative changes in left shoulder. IMPRESSION: 1. Mild pulmonary edema, improved. 2. Cardiomegaly. 3. Aortic atherosclerosis. 4. Severe left shoulder osteoarthritis. Electronically signed by: Katheleen Faes MD 07/17/2024 12:39 PM EDT RP Workstation: [id number]     Assessment and Plan: Alejandra Cruz is a 89 y.o. female with medical history significant of past medical history significant for dementia, hypertension, hyperlipidemia, restless leg syndrome, diverticulosis who is resident of skilled nursing facility presented to the emergency department for evaluation of altered mental status, fever, tachycardia, tachypnea.  Patient has large decubitus ulcer which seems to be the source of sepsis.  Initial blood pressure is low, patient's lactic acid is 4.7, white count 26, she has AKI.  Patient will need further management evaluation of septic shock.  Plan: Septic shock Infected large decubitus ulcer Presented with fever, tachycardia, tachypnea, low BP, lactic acid 4.7, white count 26, AKI, altered mental status.   Admit the patient to ICU under sepsis protocol. Patient will be continued on IV fluids as per sepsis protocol. Continue broad-spectrum IV antibiotics vancomycin  and cefepime therapy per pharmacy protocol. Follow-up blood cultures, urine cultures. Wound care consult for dressing changes. Her overall prognosis is poor discussed with patient's son at bedside.  She is DNR.  Advised palliative and comfort care if her condition worsens.  Acute metabolic encephalopathy In the setting of sepsis.  She does have baseline dementia. N.p.o. for now given her lethargy. Continue supportive care. Delirium precautions.  AKI Hyperkalemia- Continue IV  fluids as per sepsis protocol. Patient's baseline creatinine within normal limits. Continue to trend electrolytes, renal function. Avoid nephrotoxic drugs, renally adjust medications.  Hypertension: Hold of blood pressures in the setting of septic shock, altered mental status.  Hyperlipidemia: Statins once she is more alert, awake.  Advanced dementia: Continue delirium precautions. Supportive care. Patient is DNR.   Advance Care Planning:   Code Status: Limited: Do not attempt resuscitation (DNR) -DNR-LIMITED -Do Not Intubate/DNI reviewed paperwork  from facility  Consults: None  Family Communication: Patient's son at bedside with whom I discussed her critical condition, CODE STATUS  Severity of Illness: The appropriate patient status for this patient is INPATIENT. Inpatient status is judged to be reasonable and necessary in order to provide the required intensity of service to ensure the patient's safety. The patient's presenting symptoms, physical exam findings, and initial radiographic and laboratory data in the context of their chronic comorbidities is felt to place them at high risk for further clinical deterioration. Furthermore, it is not anticipated that the patient will be medically stable for discharge from the hospital within 2 midnights of admission.   * I certify that at the point of admission it is my clinical judgment that the patient will require inpatient hospital care spanning beyond 2 midnights from the point of admission due to high intensity of service, high risk for further deterioration and high frequency of surveillance required.*  Author: Concepcion Riser, MD 07/17/2024 2:45 PM  For on call review [email or web address].

## 2024-07-17 NOTE — ED Notes (Signed)
 EDP at [facility]. Daughter at [facility]. Pt rolled, known Stage 4 decubitus ulcer visualized. Treated PTA. Foley remains in place PTA .

## 2024-07-17 NOTE — ED Triage Notes (Signed)
 Pt bib [facility] from [facility] for AMS/ lethargy. Per EMS nursing staff stated pt has a change in her cognition. Wanted her seen for sepsis. Stated she had a temp of 100.9 & sats in 70s. Pt has a stage 4 decubitus

## 2024-07-17 NOTE — Significant Event (Signed)
 CROSS COVER NOTE  NAME: Alejandra Cruz MRN: [id number] DOB : 10/10/1932 ATTENDING PHYSICIAN: Darci Pore, MD    Date of Service   07/17/2024   HPI/Events of Note   [facility] cover at [employer]: Message received from nurse:  Pt not looking good on 9 of levo with last BP 74/24 (42)   HPI: Patient brought from skilled nursing facility today due to AMS, vomiting.  Initial evidence of sepsis with fever, tachycardia, elevated lactate and hypotension with decubitus as probable source (no osteomyelitis or abscess present by CT) . AKI also present. Sepsis protocol initiated: 3L LR given. Blood for cultures colleted. Foley placed no urine output. Antibiotiscs: cefepime and vanc Daughter at bedside during my exam reports patient has not been herself since Wednesday morning. Less interactive and talkative with poor oral intake since then. Med hx. Obtained from chart: Reviewed with daughter at bedside;Hyperlipidemia, dementia with agitation/hallucinations, , hypertension, restless leg syndrome, diverticulosis,  seizure disorder on keppra ; GERD, simple bronchitis with wheezing (advair+ duoneb for management) OCD, chronic pain syndrome, thrombocytopenia, gastritis with H pylori, esophageal web, parkinson's disease, chronic constipation, morbid obesity, recently diagnosed with oral thrush.  Initial labs/Imaging CT chest abdomen/pelviss- no acute findings, CT Head  - chronic microvascular ischemia  Latest Reference Range & Units 07/17/24 11:54  Sodium 135 - 145 mmol/L 147 (H)  Potassium 3.5 - 5.1 mmol/L 5.2 (H)  Chloride 98 - 111 mmol/L 102  CO2 22 - 32 mmol/L 28  Glucose 70 - 99 mg/dL 893 (H)  BUN 8 - 23 mg/dL 42 (H)  Creatinine 9.55 - 1.00 mg/dL 7.77 (H)  Calcium  8.9 - 10.3 mg/dL 9.1  Anion gap 5 - 15  16 (H)  Alkaline Phosphatase 38 - 126 U/L 88  Albumin 3.5 - 5.0 g/dL 3.5  AST 15 - 41 U/L 51 (H)  ALT 0 - 44 U/L 9  Total Protein 6.5 - 8.1 g/dL 7.1  Total  Bilirubin 0.0 - 1.2 mg/dL 0.3  GFR, Estimated >39 mL/min 21 (L)  (H): Data is abnormally high (L): Data is abnormally low   Latest Reference Range & Units 07/17/24 11:54  Lactic Acid, Venous 0.5 - 1.9 mmol/L 4.2 (HH)  (HH): Data is critically high  Latest Reference Range & Units 07/17/24 11:54  WBC 4.0 - 10.5 K/uL 26.1 (H)  RBC 3.87 - 5.11 MIL/uL 4.01  Hemoglobin 12.0 - 15.0 g/dL 87.8  HCT 63.9 - 53.9 % 37.6  MCV 80.0 - 100.0 fL 93.8  MCH 26.0 - 34.0 pg 30.2  MCHC 30.0 - 36.0 g/dL 67.7  RDW 88.4 - 84.4 % 14.1  Platelets 150 - 400 K/uL 217  nRBC 0.0 - 0.2 % 0.0  Neutrophils % 57  Lymphocytes % 5  Monocytes Relative % 5  Eosinophil % 0  Basophil % 1  NEUT# 1.7 - 7.7 K/uL 18.0 (H)  Lymphs Abs 0.7 - 4.0 K/uL 1.3  Monocyte # 0.1 - 1.0 K/uL 1.3 (H)  Eosinophils Absolute 0.0 - 0.5 K/uL 0.0  Basophils Absolute 0.0 - 0.1 K/uL 0.3 (H)  Abs Immature Granulocytes 0.00 - 0.07 K/uL 5.20 (H)  Band Neutrophils % 12  Metamyelocytes Relative % 3  Myelocytes % 12  Promyelocytes Relative % 5  RBC Morphology  MORPHOLOGY UNREMARKABLE  Smear Review  Normal platelet morphology  (H): Data is abnormally high  Interventions   Assessment/Plan:    07/17/2024   10:36 PM 07/17/2024    6:30 PM 07/17/2024  6:15 PM  Vitals with BMI  Systolic  73 84  Diastolic  28 64  Pulse 100 104 104   TEMP at 1930 above 102, on cooling blanket  Neuro - initial no response to verbal or tactile stimulation - improved with fluid and perfusion improvement Resp - Upper airway wheeze improved after duoneb, Oropharyngeal mucosa severely dry, friable, pale discoloration, resp rate 20-24; sat waveform not ideal - 96-100% on 5LNC Abdomen - soft, non distended, coffee ground emesis x1 Foley - minimal amount turbid yellow urine CV sinus rhythm irregular - tachy 99-120. Prolonged Qtc on EKG    Latest Reference Range & Units 07/17/24 21:04  pH, Ven 7.25 - 7.43  7.2 (L)  pCO2, Ven 44 - 60 mmHg 62 (H)  pO2, Ven 32 - 45  mmHg <31 (LL)  Acid-base deficit 0.0 - 2.0 mmol/L 5.2 (H)  Bicarbonate 20.0 - 28.0 mmol/L 23.8  O2 Saturation % 29.3  Patient temperature  37.3  Collection site  BLOOD RIGHT ARM  (LL): Data is critically low (L): Data is abnormally low (H): Data is abnormally high  Latest Reference Range & Units 07/17/24 21:04  Sodium 135 - 145 mmol/L 143  Potassium 3.5 - 5.1 mmol/L 4.3  Chloride 98 - 111 mmol/L 106  CO2 22 - 32 mmol/L 23  Glucose 70 - 99 mg/dL 897 (H)  BUN 8 - 23 mg/dL 45 (H)  Creatinine 9.55 - 1.00 mg/dL 7.74 (H)  Calcium  8.9 - 10.3 mg/dL 7.9 (L)  Anion gap 5 - 15  14  Alkaline Phosphatase 38 - 126 U/L 98  Albumin 3.5 - 5.0 g/dL 3.0 (L)  AST 15 - 41 U/L 75 (H)  ALT 0 - 44 U/L 22  Total Protein 6.5 - 8.1 g/dL 6.1 (L)  Total Bilirubin 0.0 - 1.2 mg/dL 0.3  GFR, Estimated >39 mL/min 20 (L)  (H): Data is abnormally high (L): Data is abnormally low__  Latest Reference Range & Units 07/17/24 21:04 07/17/24 23:35  Lactic Acid, Venous 0.5 - 1.9 mmol/L 5.3 (HH) 6.0 (HH)  (HH): Data is critically high   Latest Reference Range & Units 07/17/24 23:35 07/18/24 00:35  WBC 4.0 - 10.5 K/uL 30.0 (H) 36.4 (H)  RBC 3.87 - 5.11 MIL/uL 3.71 (L) 3.59 (L)  Hemoglobin 12.0 - 15.0 g/dL 88.8 (L) 89.1 (L)  HCT 36.0 - 46.0 % 36.1 35.0 (L)  MCV 80.0 - 100.0 fL 97.3 97.5  MCH 26.0 - 34.0 pg 29.9 30.1  MCHC 30.0 - 36.0 g/dL 69.2 69.0  RDW 88.4 - 84.4 % 14.6 14.6  Platelets 150 - 400 K/uL 155 150  nRBC 0.0 - 0.2 % 0.0 0.0  Neutrophils % 76 75  Lymphocytes % 4 3  Monocytes Relative % 7 10  Eosinophil % 0 0  Basophil % 0 0  Immature Granulocytes % 13 12  NEUT# 1.7 - 7.7 K/uL 22.6 (H) 27.1 (H)  Lymphs Abs 0.7 - 4.0 K/uL 1.1 1.2  Monocyte # 0.1 - 1.0 K/uL 2.2 (H) 3.8 (H)  Eosinophils Absolute 0.0 - 0.5 K/uL 0.0 0.0  Basophils Absolute 0.0 - 0.1 K/uL 0.1 0.1  Abs Immature Granulocytes 0.00 - 0.07 K/uL 3.89 (H) 4.20 (H)  (H): Data is abnormally high (L): Data is abnormally  low _______________________________________________________  Septic shock with multi organ dysfunction  additional liter of fluid + albumin, + steroids +vasopressin if needed -worsening lactic acidosis  and leukocytosis-antibiotic change to vanc, meropenem and flagyl = diflucan Continue to trend lactic acid  Continue pressors, maintain MAP >65 Follow up blood and urine culture Echo ordered - 2010 EF 55%;  Maintain euglycemia - D10 added - CBG mmonitoring 3very 4h   AKI  fluids + maintain normotension Monitor renal function  Renal dose meds Monitor urine output  Acute respiratory failure with hypercapnea Cannot r/o pneumonia revealing after hydration + high risk aspiration with vomiting episode + esophageal web hx Duonebs as needed wheezing Bipap as tolerated Resp panel by pcr ordered Repeat cxr in am Repeat blood gas ordered  Metabolic encephalopathy All the above + check Keppra  level - monitor for absance type seizure activity  Coffee ground emesis Repeat hgb stable IV protonix  BID If reoccurs place NGT LIWS    COD status DNR/DNI unchanged DVT prophylaxis - SCDs ordered -current bleeding risk   CRITICAL CARE Performed by: Erminio Cone   Total critical care time: 90 minutes  Critical care time was exclusive of separately billable procedures and treating other patients.  Critical care was necessary to treat or prevent imminent or life-threatening deterioration.  Critical care was time spent personally by me on the following activities: development of treatment plan with patient and/or surrogate as well as nursing, discussions with consultants, evaluation of patient's response to treatment, examination of patient, obtaining history from patient or surrogate, ordering and performing treatments and interventions, ordering and review of laboratory studies, ordering and review of radiographic studies, pulse oximetry and re-evaluation of patient's condition.      Erminio LITTIE Cone NP [facility] Cross Cover 7pm-7am - check amion for availability Pager [phone]

## 2024-07-17 NOTE — Progress Notes (Addendum)
 Pharmacy Antibiotic Note  Alejandra Cruz is a 89 y.o. female admitted on 07/17/2024 with wound infection.  Pharmacy has been consulted for Vancomycin  dosing.  Plan: Vancomycin  2000mg  IV loading dose, 750 mg IV Q 48 hrs. Goal AUC 400-550. Expected AUC: 425 SCr used: 2.22  Cefepime 2gm IV q24h F/u cxs and clinical progress Monitor V/S, labs and levels as indicated  Height: 5' 8 (172.7 cm) Weight: 94 kg (207 lb 3.7 oz) IBW/kg (Calculated) : 63.9  Temp (24hrs), Avg:98.5 F (36.9 C), Min:97.6 F (36.4 C), Max:100.3 F (37.9 C)  Recent Labs  Lab 07/17/24 1154  WBC 26.1*  CREATININE 2.22*  LATICACIDVEN 4.2*    Estimated Creatinine Clearance: 20.6 mL/min (A) (by C-G formula based on SCr of 2.22 mg/dL (H)).    No Known Allergies  Antimicrobials this admission: Vancomycin  10/10 >>  Cefepime 10/10>> ceftriaxone 10/10 in ED  Microbiology results: 10/10 BCx: pending  Thank you for allowing pharmacy to be a part of this patient's care.  Jenesys Casseus, BS Pharm D, BCPS Clinical Pharmacist 07/17/2024 2:08 PM

## 2024-07-17 NOTE — ED Notes (Addendum)
 EDP at [facility]. Family at [facility]. Pt remains tachypneic with upper airway noises. NAD. No urine output at this time. Foley clamped for sample.

## 2024-07-18 ENCOUNTER — Inpatient Hospital Stay (HOSPITAL_COMMUNITY)

## 2024-07-18 DIAGNOSIS — G40909 Epilepsy, unspecified, not intractable, without status epilepticus: Secondary | ICD-10-CM

## 2024-07-18 DIAGNOSIS — R6520 Severe sepsis without septic shock: Secondary | ICD-10-CM | POA: Diagnosis not present

## 2024-07-18 DIAGNOSIS — R578 Other shock: Secondary | ICD-10-CM | POA: Diagnosis not present

## 2024-07-18 DIAGNOSIS — G9341 Metabolic encephalopathy: Secondary | ICD-10-CM | POA: Insufficient documentation

## 2024-07-18 DIAGNOSIS — A419 Sepsis, unspecified organism: Secondary | ICD-10-CM | POA: Diagnosis not present

## 2024-07-18 DIAGNOSIS — J9601 Acute respiratory failure with hypoxia: Secondary | ICD-10-CM | POA: Insufficient documentation

## 2024-07-18 LAB — BLOOD CULTURE ID PANEL (REFLEXED) - BCID2

## 2024-07-18 LAB — RESPIRATORY PANEL BY PCR

## 2024-07-18 LAB — BLOOD GAS, ARTERIAL
Acid-base deficit: 6.9 mmol/L — ABNORMAL HIGH (ref 0.0–2.0)
Bicarbonate: 18.5 mmol/L — ABNORMAL LOW (ref 20.0–28.0)
Drawn by: 38235
O2 Saturation: 100 %
Patient temperature: 37
pCO2 arterial: 36 mmHg (ref 32–48)
pH, Arterial: 7.32 — ABNORMAL LOW (ref 7.35–7.45)
pO2, Arterial: 153 mmHg — ABNORMAL HIGH (ref 83–108)

## 2024-07-18 LAB — ECHOCARDIOGRAM COMPLETE
AR max vel: 2 cm2
AV Area VTI: 2.05 cm2
AV Area mean vel: 2.17 cm2
AV Mean grad: 5 mmHg
AV Peak grad: 8.1 mmHg
Ao pk vel: 1.42 m/s
Area-P 1/2: 9.85 cm2
Height: 68 in
S' Lateral: 4.5 cm
Weight: 3315.72 [oz_av]

## 2024-07-18 LAB — MAGNESIUM: Magnesium: 2.1 mg/dL (ref 1.7–2.4)

## 2024-07-18 LAB — C DIFFICILE QUICK SCREEN W PCR REFLEX
C Diff antigen: POSITIVE — AB
C Diff toxin: NEGATIVE

## 2024-07-18 LAB — BASIC METABOLIC PANEL WITH GFR
Anion gap: 16 — ABNORMAL HIGH (ref 5–15)
BUN: 47 mg/dL — ABNORMAL HIGH (ref 8–23)
CO2: 22 mmol/L (ref 22–32)
Calcium: 8 mg/dL — ABNORMAL LOW (ref 8.9–10.3)
Chloride: 106 mmol/L (ref 98–111)
Creatinine, Ser: 1.88 mg/dL — ABNORMAL HIGH (ref 0.44–1.00)
GFR, Estimated: 25 mL/min — ABNORMAL LOW (ref >60)
Glucose, Bld: 129 mg/dL — ABNORMAL HIGH (ref 70–99)
Potassium: 4.4 mmol/L (ref 3.5–5.1)
Sodium: 143 mmol/L (ref 135–145)

## 2024-07-18 LAB — CBC WITH DIFFERENTIAL/PLATELET
Abs Immature Granulocytes: 3.89 K/uL — ABNORMAL HIGH (ref 0.00–0.07)
Abs Immature Granulocytes: 4.2 K/uL — ABNORMAL HIGH (ref 0.00–0.07)
Basophils Absolute: 0.1 K/uL (ref 0.0–0.1)
Basophils Absolute: 0.1 K/uL (ref 0.0–0.1)
Basophils Relative: 0 %
Basophils Relative: 0 %
Eosinophils Absolute: 0 K/uL (ref 0.0–0.5)
Eosinophils Absolute: 0 K/uL (ref 0.0–0.5)
Eosinophils Relative: 0 %
Eosinophils Relative: 0 %
HCT: 35 % — ABNORMAL LOW (ref 36.0–46.0)
HCT: 36.1 % (ref 36.0–46.0)
Hemoglobin: 10.8 g/dL — ABNORMAL LOW (ref 12.0–15.0)
Hemoglobin: 11.1 g/dL — ABNORMAL LOW (ref 12.0–15.0)
Immature Granulocytes: 12 %
Immature Granulocytes: 13 %
Lymphocytes Relative: 3 %
Lymphocytes Relative: 4 %
Lymphs Abs: 1.1 K/uL (ref 0.7–4.0)
Lymphs Abs: 1.2 K/uL (ref 0.7–4.0)
MCH: 29.9 pg (ref 26.0–34.0)
MCH: 30.1 pg (ref 26.0–34.0)
MCHC: 30.7 g/dL (ref 30.0–36.0)
MCHC: 30.9 g/dL (ref 30.0–36.0)
MCV: 97.3 fL (ref 80.0–100.0)
MCV: 97.5 fL (ref 80.0–100.0)
Monocytes Absolute: 2.2 K/uL — ABNORMAL HIGH (ref 0.1–1.0)
Monocytes Absolute: 3.8 K/uL — ABNORMAL HIGH (ref 0.1–1.0)
Monocytes Relative: 10 %
Monocytes Relative: 7 %
Neutro Abs: 22.6 K/uL — ABNORMAL HIGH (ref 1.7–7.7)
Neutro Abs: 27.1 K/uL — ABNORMAL HIGH (ref 1.7–7.7)
Neutrophils Relative %: 75 %
Neutrophils Relative %: 76 %
Platelets: 150 K/uL (ref 150–400)
Platelets: 155 K/uL (ref 150–400)
RBC: 3.59 MIL/uL — ABNORMAL LOW (ref 3.87–5.11)
RBC: 3.71 MIL/uL — ABNORMAL LOW (ref 3.87–5.11)
RDW: 14.6 % (ref 11.5–15.5)
RDW: 14.6 % (ref 11.5–15.5)
WBC: 30 K/uL — ABNORMAL HIGH (ref 4.0–10.5)
WBC: 36.4 K/uL — ABNORMAL HIGH (ref 4.0–10.5)
nRBC: 0 % (ref 0.0–0.2)
nRBC: 0 % (ref 0.0–0.2)

## 2024-07-18 LAB — LACTIC ACID, PLASMA
Lactic Acid, Venous: 3.7 mmol/L (ref 0.5–1.9)
Lactic Acid, Venous: 5.3 mmol/L (ref 0.5–1.9)
Lactic Acid, Venous: 6 mmol/L (ref 0.5–1.9)

## 2024-07-18 LAB — GLUCOSE, CAPILLARY
Glucose-Capillary: 111 mg/dL — ABNORMAL HIGH (ref 70–99)
Glucose-Capillary: 115 mg/dL — ABNORMAL HIGH (ref 70–99)
Glucose-Capillary: 130 mg/dL — ABNORMAL HIGH (ref 70–99)
Glucose-Capillary: 127 mg/dL — ABNORMAL HIGH (ref 70–99)
Glucose-Capillary: 130 mg/dL — ABNORMAL HIGH (ref 70–99)
Glucose-Capillary: 151 mg/dL — ABNORMAL HIGH (ref 70–99)

## 2024-07-18 LAB — PHOSPHORUS: Phosphorus: 3.9 mg/dL (ref 2.5–4.6)

## 2024-07-18 LAB — PROTIME-INR
INR: 1.8 — ABNORMAL HIGH (ref 0.8–1.2)
Prothrombin Time: 21.9 s — ABNORMAL HIGH (ref 11.4–15.2)

## 2024-07-18 MED ORDER — MAGIC MOUTHWASH
10.0000 mL | Freq: Four times a day (QID) | ORAL | Status: DC
  Administered 2024-07-18 – 2024-07-21 (×12): 10 mL via ORAL
  Filled 2024-07-18 (×21): qty 10

## 2024-07-18 MED ORDER — ALBUTEROL SULFATE (2.5 MG/3ML) 0.083% IN NEBU
INHALATION_SOLUTION | RESPIRATORY_TRACT | Status: AC
  Administered 2024-07-18: 2.5 mg
  Filled 2024-07-18: qty 3

## 2024-07-18 MED ORDER — MAGIC MOUTHWASH
15.0000 mL | Freq: Four times a day (QID) | ORAL | Status: DC
  Filled 2024-07-18 (×6): qty 15

## 2024-07-18 MED ORDER — SODIUM CHLORIDE 0.9 % IV SOLN
1.0000 g | Freq: Two times a day (BID) | INTRAVENOUS | Status: DC
  Administered 2024-07-18 – 2024-07-20 (×6): 1 g via INTRAVENOUS
  Filled 2024-07-18 (×6): qty 20

## 2024-07-18 MED ORDER — METRONIDAZOLE 500 MG/100ML IV SOLN: 500.0000 mg | Freq: Two times a day (BID) | INTRAVENOUS | Status: DC

## 2024-07-18 NOTE — Plan of Care (Signed)
  Problem: Fluid Volume: Goal: Hemodynamic stability will improve Outcome: Progressing   Problem: Respiratory: Goal: Ability to maintain adequate ventilation will improve Outcome: Progressing   Problem: Coping: Goal: Level of anxiety will decrease Outcome: Progressing   Problem: Pain Managment: Goal: General experience of comfort will improve and/or be controlled Outcome: Progressing   Problem: Safety: Goal: Ability to remain free from injury will improve Outcome: Progressing

## 2024-07-18 NOTE — Progress Notes (Signed)
 Patient was admitted with chronic foley in place. There was no placement date present on the drainage bag. Foley discontinued and replaced with new foley per MD order.

## 2024-07-18 NOTE — Progress Notes (Signed)
 Progress Note   Patient: Alejandra Cruz FMW:[id number] DOB: [birth year] DOA: 07/17/2024     1 DOS: the patient was seen and examined on 07/18/2024   Brief hospital course: Alejandra Cruz is a 89 y.o. female with medical history significant of past medical history significant for dementia, hypertension, hyperlipidemia, restless leg syndrome, diverticulosis who is resident of skilled nursing facility presented to the emergency department for evaluation of altered mental status, fever, tachycardia, tachypnea.  Patient has large decubitus ulcer which seems to be the source of sepsis.  Initial blood pressure is low, patient's lactic acid is 4.7, white count 26, she has AKI.  Patient will need further management evaluation of septic shock.   Patient was started on IV vancomycin , cefepime with multiple IV fluid boluses to maintain blood pressure.  Patient had high-grade fever, last night antibiotics changed to vancomycin  and meropenem.  Started on Solu-Medrol  therapy.  Assessment and Plan: Septic shock with multiorgan dysfunction Infected large decubitus ulcer- Proteus bacteremia Presented with fever, tachycardia, tachypnea, low BP, lactic acid 4.7, white count 26, AKI, altered mental status, respiratory failure.   She got IV fluid bolus as per sepsis protocol. Continue broad-spectrum IV antibiotics vancomycin , meropenam and Flagyl therapy per pharmacy protocol. Pressors via peripheral line if her blood pressure drops. Blood cultures grew Proteus, follow sensitivities. Wound care ordered dressing changes, air loss mattress.   Acute metabolic encephalopathy In the setting of sepsis.  She does have baseline dementia. N.p.o. for now given her lethargy. Continue supportive care. Delirium precautions.  Acute hypoxic respiratory failure- Presented tachycardic, tachypneic, hypoxic. Patient is currently on 3 L supplemental oxygen to manage oxygen greater than 92%.  Continue to wean her oxygen.    AKI Hyperkalemia- Improving with IV fluids. Patient's baseline creatinine within normal limits. Continue to trend electrolytes, renal function. Avoid nephrotoxic drugs, renally adjust medications.   Hypertension: Hold of blood pressures in the setting of septic shock, altered mental status.   Hyperlipidemia: Statins once she is more alert, awake.   Advanced dementia: Continue delirium precautions. Supportive care. Patient is DNR.       Out of bed to chair. Incentive spirometry. Nursing supportive care. Fall, aspiration precautions. Diet:  Diet Orders (From admission, onward)     Start     Ordered   07/17/24 1154  Diet NPO time specified  (Undifferentiated presentation (screening labs and basic nursing orders))  Diet effective now        07/17/24 1154           DVT prophylaxis: heparin injection 5,000 Units Start: 07/17/24 2200 SCDs Start: 07/17/24 1443  Level of care: ICU   Code Status: Limited: Do not attempt resuscitation (DNR) -DNR-LIMITED -Do Not Intubate/DNI   Subjective: Patient is seen and examined today.  Last night she had very low blood pressures even on pressors.  Patient got additional liter of fluid, albumin, steroids and antibiotics changed to meropenem and vancomycin .  Today patient is able to tell her name, answer appropriately though closing her eyes.  Alejandra Cruz at bedside.  Breathing better, BP, tachycardia improved.  Physical Exam: Vitals:   07/18/24 1000 07/18/24 1100 07/18/24 1200 07/18/24 1400  BP: 128/80 111/62 115/69 117/71  Pulse:      Resp: 20 18 18 18   Temp:      TempSrc:      SpO2: 100% 100%    Weight:      Height:        General - Elderly African American ill female, mild  respiratory distress HEENT - PERRLA, EOMI, atraumatic head, non tender sinuses. Lung - Clear, basal rales, rhonchi, diffuse wheezes. Heart - S1, S2 heard, no murmurs, rubs, trace pedal edema. Abdomen - Soft, non tender, bowel sounds good Neuro -able to follow  commands, closes eyes, unable to do full neuroexam. Skin - Warm and dry.  Data Reviewed:      Latest Ref Rng & Units 07/18/2024   12:35 AM 07/17/2024   11:35 PM 07/17/2024   11:54 AM  CBC  WBC 4.0 - 10.5 K/uL 36.4  30.0  26.1   Hemoglobin 12.0 - 15.0 g/dL 89.1  88.8  87.8   Hematocrit 36.0 - 46.0 % 35.0  36.1  37.6   Platelets 150 - 400 K/uL 150  155  217       Latest Ref Rng & Units 07/18/2024    3:01 AM 07/17/2024    9:04 PM 07/17/2024   11:54 AM  BMP  Glucose 70 - 99 mg/dL 870  897  893   BUN 8 - 23 mg/dL 47  45  42   Creatinine 0.44 - 1.00 mg/dL 8.11  7.74  7.77   Sodium 135 - 145 mmol/L 143  143  147   Potassium 3.5 - 5.1 mmol/L 4.4  4.3  5.2   Chloride 98 - 111 mmol/L 106  106  102   CO2 22 - 32 mmol/L 22  23  28    Calcium  8.9 - 10.3 mg/dL 8.0  7.9  9.1    ECHOCARDIOGRAM COMPLETE Result Date: 07/18/2024    ECHOCARDIOGRAM REPORT   Patient Name:   Alejandra Cruz Date of Exam: 07/18/2024 Medical Rec #:  [id number]      Height:       68.0 in Accession #:    [id number]     Weight:       207.2 lb Date of Birth:  1933/02/08      BSA:          2.075 m Patient Age:    89 years       BP:           131/85 mmHg Patient Gender: F              HR:           94 bpm. Exam Location:  [facility] Procedure: 2D Echo, Cardiac Doppler and Color Doppler (Both Spectral and Color            Flow Doppler were utilized during procedure). Indications:    Shock R57.9  History:        Patient has no prior history of Echocardiogram examinations.                 Risk Factors:Hypertension.  Sonographer:    Alejandra Cruz Referring Phys: [id number] Alejandra Cruz IMPRESSIONS  1. Left ventricular ejection fraction, by estimation, is 20 to 25%. The left ventricle has severely decreased function. The left ventricle demonstrates global hypokinesis. Left ventricular diastolic parameters are indeterminate.  2. Right ventricular systolic function is moderately reduced. The right ventricular size is normal. There is normal  pulmonary artery systolic pressure. The estimated right ventricular systolic pressure is 30.4 mmHg.  3. The mitral valve is grossly normal. Mild mitral valve regurgitation.  4. The aortic valve is tricuspid. Aortic valve regurgitation is trivial. Aortic valve sclerosis is present, with no evidence of aortic valve stenosis. Aortic valve mean gradient measures 5.0 mmHg.  5. The inferior vena cava is  dilated in size with <50% respiratory variability, suggesting right atrial pressure of 15 mmHg. Comparison(s): No prior Echocardiogram. FINDINGS  Left Ventricle: Left ventricular ejection fraction, by estimation, is 20 to 25%. The left ventricle has severely decreased function. The left ventricle demonstrates global hypokinesis. The left ventricular internal cavity size was normal in size. There is no left ventricular hypertrophy. Left ventricular diastolic parameters are indeterminate. Right Ventricle: The right ventricular size is normal. No increase in right ventricular wall thickness. Right ventricular systolic function is moderately reduced. There is normal pulmonary artery systolic pressure. The tricuspid regurgitant velocity is 1.96 m/s, and with an assumed right atrial pressure of 15 mmHg, the estimated right ventricular systolic pressure is 30.4 mmHg. Left Atrium: Left atrial size was normal in size. Right Atrium: Right atrial size was normal in size. Pericardium: There is no evidence of pericardial effusion. Mitral Valve: The mitral valve is grossly normal. Mild mitral valve regurgitation. Tricuspid Valve: The tricuspid valve is grossly normal. Tricuspid valve regurgitation is trivial. Aortic Valve: The aortic valve is tricuspid. There is mild aortic valve annular calcification. Aortic valve regurgitation is trivial. Aortic valve sclerosis is present, with no evidence of aortic valve stenosis. Aortic valve mean gradient measures 5.0 mmHg. Aortic valve peak gradient measures 8.1 mmHg. Aortic valve area, by VTI  measures 2.05 cm. Pulmonic Valve: The pulmonic valve was not well visualized. Pulmonic valve regurgitation is mild. Aorta: The aortic root is normal in size and structure. Venous: The inferior vena cava is dilated in size with less than 50% respiratory variability, suggesting right atrial pressure of 15 mmHg. IAS/Shunts: No atrial level shunt detected by color flow Doppler. Additional Comments: 3D was performed not requiring image post processing on an independent workstation and was indeterminate.  LEFT VENTRICLE PLAX 2D LVIDd:         5.00 cm LVIDs:         4.50 cm LV PW:         0.80 cm LV IVS:        1.00 cm LVOT diam:     2.00 cm LV SV:         56 LV SV Index:   27 LVOT Area:     3.14 cm  LEFT ATRIUM             Index        RIGHT ATRIUM           Index LA Vol (A2C):   35.6 ml 17.16 ml/m  RA Area:     13.70 cm LA Vol (A4C):   31.5 ml 15.18 ml/m  RA Volume:   31.90 ml  15.37 ml/m LA Biplane Vol: 36.8 ml 17.74 ml/m  AORTIC VALVE AV Area (Vmax):    2.00 cm AV Area (Vmean):   2.17 cm AV Area (VTI):     2.05 cm AV Vmax:           142.00 cm/s AV Vmean:          108.000 cm/s AV VTI:            0.273 m AV Peak Grad:      8.1 mmHg AV Mean Grad:      5.0 mmHg LVOT Vmax:         90.40 cm/s LVOT Vmean:        74.600 cm/s LVOT VTI:          0.178 m LVOT/AV VTI ratio: 0.65  AORTA Ao Root diam: 2.90 cm MITRAL  VALVE                TRICUSPID VALVE MV Area (PHT): 9.85 cm     TR Peak grad:   15.4 mmHg MV Decel Time: 77 msec      TR Vmax:        196.00 cm/s MV E velocity: 105.00 cm/s                             SHUNTS                             Systemic VTI:  0.18 m                             Systemic Diam: 2.00 cm Alejandra Sierras MD Electronically signed by Alejandra Sierras MD Signature Date/Time: 07/18/2024/2:19:17 PM    Final    CT CHEST ABDOMEN PELVIS WO CONTRAST Result Date: 07/17/2024 EXAM: CT CHEST, ABDOMEN AND PELVIS WITHOUT CONTRAST 07/17/2024 01:22:39 PM TECHNIQUE: CT of the chest, abdomen and pelvis was  performed without the administration of intravenous contrast. Multiplanar reformatted images are provided for review. Automated exposure control, iterative reconstruction, and/or weight based adjustment of the mA/kV was utilized to reduce the radiation dose to as low as reasonably achievable. COMPARISON: None available. CLINICAL HISTORY: Sepsis workup. Vomiting. Also has decubitus ulcer at the sacral area. Patient brought in by EMS from [facility] for AMS/lethargy. Per EMS, nursing staff stated patient has a change in her cognition. Wanted her seen for sepsis. Stated she had a temperature of 100.9 and sats in 70s. Patient has a stage 4 decubitus. Patient very heavy mouth breathing, hard to keep still. FINDINGS: CHEST: MEDIASTINUM AND LYMPH NODES: Heart is unremarkable. No pericardial fluid. The central airways are clear. No mediastinal, hilar or axillary lymphadenopathy. LUNGS AND PLEURA: No pneumonia. No pleural fluid. No pneumothorax. ABDOMEN AND PELVIS: LIVER: The liver is unremarkable. GALLBLADDER AND BILE DUCTS: Gallbladder is unremarkable. No biliary ductal dilatation. SPLEEN: No acute abnormality. PANCREAS: No acute abnormality. ADRENAL GLANDS: No acute abnormality. KIDNEYS, URETERS AND BLADDER: No stones in the kidneys or ureters. No hydronephrosis. No perinephric or periureteral stranding. Bladder collapse around the foley catheter. GI AND BOWEL: Stomach demonstrates no acute abnormality. There is no bowel obstruction. Appendix not identified. REPRODUCTIVE ORGANS: No acute abnormality. PERITONEUM AND RETROPERITONEUM: No ascites. No free air. VASCULATURE: Aorta is normal in caliber. ABDOMINAL AND PELVIS LYMPH NODES: No lymphadenopathy. BONES AND SOFT TISSUES: Bilateral hip prosthetics. Skin ulceration extends to the lower sacrum on image 105 of series 2. No organized fluid collection. No CT evidence of acute osteomyelitis. IMPRESSION: 1. No acute abnormality of the chest, abdomen, and pelvis. 2.  Lower sacrum decubitus ulcer without evidence of abscess or acute osteomyelitis by CT imaging Electronically signed by: Norleen Boxer MD 07/17/2024 01:58 PM EDT RP Workstation: [id number]   CT Head Wo Contrast Result Date: 07/17/2024 EXAM: CT HEAD WITHOUT CONTRAST 07/17/2024 01:22:39 PM TECHNIQUE: CT of the head was performed without the administration of intravenous contrast. Automated exposure control, iterative reconstruction, and/or weight based adjustment of the mA/kV was utilized to reduce the radiation dose to as low as reasonably achievable. COMPARISON: None available. CLINICAL HISTORY: AMS. Eval for bleed or other pathology. Likely metabolic. Patient brought in by [facility] from [facility] for AMS/ lethargy. Per EMS nursing staff, patient  has a change in her cognition. Wanted her seen for sepsis. Stated she had a temp of 100.9 and sats in 70s. Patient has a stage 4 decubitus. Patient very heavy mouth breathing, hard to keep still. FINDINGS: BRAIN AND VENTRICLES: No acute hemorrhage. No evidence of acute infarct. No hydrocephalus. No extra-axial collection. No mass effect or midline shift. Dense basal ganglia calcifications are again noted. Periventricular and subcortical white matter hypoattenuation has progressed since the prior exam. This most likely reflects the sequelae of chronic microvascular ischemia. Chronic calcifications are present within the dentate nuclei of the cerebellum bilaterally. ORBITS: Right lens replacement is noted. SINUSES: No acute abnormality. SOFT TISSUES AND SKULL: No acute soft tissue abnormality. No skull fracture. IMPRESSION: 1. No acute intracranial abnormality. 2. Progression of periventricular and subcortical white matter hypoattenuation, likely reflecting sequelae of chronic microvascular ischemia. Electronically signed by: Lonni Necessary MD 07/17/2024 01:29 PM EDT RP Workstation: [id number]   DG Chest Port 1 View Result Date: 07/17/2024 EXAM: 1 VIEW(S)  XRAY OF THE CHEST 07/17/2024 12:17:00 PM COMPARISON: None available. CLINICAL HISTORY: Questionable sepsis, altered mental status, lethargy, change in cognition, fever 100.9, sats in 70s, stage 4 decubitus. FINDINGS: LUNGS AND PLEURA: Mild pulmonary edema, improved. No focal pulmonary opacity. No pleural effusion. No pneumothorax. HEART AND MEDIASTINUM: Cardiomegaly. Aortic calcification. BONES AND SOFT TISSUES: Severe degenerative changes in left shoulder. IMPRESSION: 1. Mild pulmonary edema, improved. 2. Cardiomegaly. 3. Aortic atherosclerosis. 4. Severe left shoulder osteoarthritis. Electronically signed by: Katheleen Faes MD 07/17/2024 12:39 PM EDT RP Workstation: [id number]    Family Communication: Discussed with patient's Alejandra Cruz at bedside. He understand and agree. All questions answered.  Disposition: Status is: Inpatient Remains inpatient appropriate because: septic shock, multiorgan involvement, bacteremia.  Planned Discharge Destination: Skilled nursing facility     Time spent: 56 minutes  Author: Concepcion Riser, MD 07/18/2024 2:51 PM Secure chat 7am to 7pm For on call review [email or web address].

## 2024-07-18 NOTE — TOC Initial Note (Signed)
 Transition of Care [facility]) - Initial/Assessment Note    Patient Details  Name: Alejandra Cruz MRN: [id number] Date of Birth: Sep 03, 1933  Transition of Care [facility]) CM/SW Contact:    Sharlyne Stabs, RN Phone Number: 07/18/2024, 1:00 PM  Clinical Narrative:          Patient is from [facility], Dementia with agitation at baseline. Come to ED with confusion, a temp of 100.9 & sats in 70s. Pt has a stage 4 decubitus. Most form states Comfort care.  Critical per MD, work up continues. IPCM following.    Expected Discharge Plan: Long Term Acute Care (LTAC) Barriers to Discharge: Continued Medical Work up   Patient Goals and CMS Choice Patient states their goals for this hospitalization and ongoing recovery are:: Return to [facility] CMS Medicare.gov Compare Post Acute Care list provided to:: Patient Choice offered to / list presented to : Patient [place] ownership interest in [facility].provided to:: Patient   Expected Discharge Plan and Services      Living arrangements for the past 2 months: Skilled Nursing Facility                   Prior Living Arrangements/Services Living arrangements for the past 2 months: Skilled Nursing Facility Lives with:: Facility Resident Patient language and need for interpreter reviewed:: Yes        Need for Family Participation in Patient Care: Yes (Comment) Care giver support system in place?: Yes (comment)   Criminal Activity/Legal Involvement Pertinent to Current Situation/Hospitalization: No - Comment as needed  Activities of Daily Living      Emotional Assessment    Orientation: : Oriented to Self      Admission diagnosis:  Severe sepsis (HCC) [A41.9, R65.20] Pressure injury of back, stage 4 (HCC) [L89.104] Sepsis with acute renal failure without septic shock, due to unspecified organism, unspecified acute renal failure type (HCC) [A41.9, R65.20, N17.9] Patient Active Problem List   Diagnosis Date Noted   Severe sepsis  (HCC) 07/17/2024   AKI (acute kidney injury) 07/17/2024   Pressure injury of skin 07/17/2024   Chronic pain syndrome 04/28/2024   Severe vascular dementia with agitation (HCC) 02/20/2024   Agitation due to dementia (HCC) 02/03/2024   Hypocalcemia 11/12/2023   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   Anemia, normocytic normochromic 02/20/2023   Delusions (HCC) 01/29/2023   Hallucinations 01/29/2023   Dementia with agitation (HCC) 12/11/2022   Parkinson disease (HCC) 03/10/2021   Morbid obesity (HCC) 02/14/2021   History of deep venous thrombosis (DVT) of distal vein of right lower extremity 11/29/2020   Unspecified protein-calorie malnutrition 11/02/2020   Thrombocytopenia 11/02/2020   Dementia with behavioral disturbance (HCC) 10/08/2019   Obesity (BMI 30-39.9) 10/05/2019   Osteoporosis 12/31/2018   Hypokalemia 11/24/2018   Chronic generalized pain 11/24/2018   Chronic bronchitis (HCC) 10/14/2017   Obsessive-compulsive disorder 08/03/2010   Chronic constipation 07/13/2009   Seizure disorder (HCC) 06/11/2009   Dyslipidemia 06/09/2009   Depression with anxiety 06/09/2009   Essential hypertension 06/09/2009   PCP:  Landy Barnie RAMAN, NP Pharmacy:   [facility] - [place], [place] - [address] [place] [address] Phone: [phone] Fax: [phone]     Social Drivers of Health (SDOH) Social History: SDOH Screenings   Food Insecurity: Patient Unable To Answer (07/18/2024)  Housing: Patient Unable To Answer (07/18/2024)  Transportation Needs: Patient Unable To Answer (07/18/2024)  Utilities: Patient Unable To Answer (07/18/2024)  Depression (  PHQ2-9): Low Risk  (01/31/2023)  Financial Resource Strain: Low Risk  (07/14/2018)  Physical Activity: Insufficiently Active (07/14/2018)  Social Connections: Patient Unable To Answer (07/18/2024)  Stress: Stress Concern Present (07/14/2018)  Tobacco Use: Low Risk   (07/17/2024)   SDOH Interventions:     Readmission Risk Interventions    07/18/2024   12:58 PM  Readmission Risk Prevention Plan  Transportation Screening Complete  PCP or Specialist Appt within 3-5 Days Complete  HRI or Home Care Consult Complete  Social Work Consult for Recovery Care Planning/Counseling Complete  Palliative Care Screening Not Applicable  Medication Review Oceanographer) Complete

## 2024-07-19 DIAGNOSIS — G40909 Epilepsy, unspecified, not intractable, without status epilepticus: Secondary | ICD-10-CM | POA: Diagnosis not present

## 2024-07-19 DIAGNOSIS — A419 Sepsis, unspecified organism: Secondary | ICD-10-CM | POA: Diagnosis not present

## 2024-07-19 DIAGNOSIS — N179 Acute kidney failure, unspecified: Secondary | ICD-10-CM | POA: Diagnosis not present

## 2024-07-19 DIAGNOSIS — G9341 Metabolic encephalopathy: Secondary | ICD-10-CM | POA: Diagnosis not present

## 2024-07-19 DIAGNOSIS — G20A2 Parkinson's disease without dyskinesia, with fluctuations: Secondary | ICD-10-CM

## 2024-07-19 DIAGNOSIS — R6521 Severe sepsis with septic shock: Secondary | ICD-10-CM

## 2024-07-19 LAB — GLUCOSE, CAPILLARY
Glucose-Capillary: 124 mg/dL — ABNORMAL HIGH (ref 70–99)
Glucose-Capillary: 136 mg/dL — ABNORMAL HIGH (ref 70–99)
Glucose-Capillary: 139 mg/dL — ABNORMAL HIGH (ref 70–99)
Glucose-Capillary: 144 mg/dL — ABNORMAL HIGH (ref 70–99)
Glucose-Capillary: 145 mg/dL — ABNORMAL HIGH (ref 70–99)
Glucose-Capillary: 154 mg/dL — ABNORMAL HIGH (ref 70–99)
Glucose-Capillary: 157 mg/dL — ABNORMAL HIGH (ref 70–99)

## 2024-07-19 LAB — CBC
HCT: 31.6 % — ABNORMAL LOW (ref 36.0–46.0)
Hemoglobin: 10.3 g/dL — ABNORMAL LOW (ref 12.0–15.0)
MCH: 30.5 pg (ref 26.0–34.0)
MCHC: 32.6 g/dL (ref 30.0–36.0)
MCV: 93.5 fL (ref 80.0–100.0)
Platelets: 118 K/uL — ABNORMAL LOW (ref 150–400)
RBC: 3.38 MIL/uL — ABNORMAL LOW (ref 3.87–5.11)
RDW: 15 % (ref 11.5–15.5)
WBC: 37.5 K/uL — ABNORMAL HIGH (ref 4.0–10.5)
nRBC: 0 % (ref 0.0–0.2)

## 2024-07-19 LAB — BASIC METABOLIC PANEL WITH GFR
Anion gap: 10 (ref 5–15)
BUN: 36 mg/dL — ABNORMAL HIGH (ref 8–23)
CO2: 25 mmol/L (ref 22–32)
Calcium: 7.5 mg/dL — ABNORMAL LOW (ref 8.9–10.3)
Chloride: 109 mmol/L (ref 98–111)
Creatinine, Ser: 0.74 mg/dL (ref 0.44–1.00)
GFR, Estimated: >60 mL/min (ref >60)
Glucose, Bld: 151 mg/dL — ABNORMAL HIGH (ref 70–99)
Potassium: 4.1 mmol/L (ref 3.5–5.1)
Sodium: 144 mmol/L (ref 135–145)

## 2024-07-19 LAB — CLOSTRIDIUM DIFFICILE BY PCR, REFLEXED
Hypervirulent Strain: NEGATIVE
Toxigenic C. Difficile by PCR: NEGATIVE

## 2024-07-19 NOTE — Hospital Course (Signed)
 Brief Narrative:   89 y.o. female with medical history significant of past medical history significant for dementia, hypertension, hyperlipidemia, restless leg syndrome, diverticulosis who is resident of skilled nursing facility presented to the emergency department for evaluation of altered mental status, fever, tachycardia, tachypnea.  Patient has large decubitus ulcer which seems to be the source of sepsis.  Initial blood pressure is low, patient's lactic acid is 4.7, white count 26, she has AKI.  Patient will need further management evaluation of septic shock.  Initially started on broad-spectrum antibiotics and also received steroids.   Patient was started on IV vancomycin , cefepime with multiple IV fluid boluses to maintain blood pressure.  Patient had high-grade fever, last night antibiotics changed to vancomycin  and meropenem.  Started on Solu-Medrol  therapy.  Despite of aggressive medical measure patient's condition has not made any meaningful recovery therefore ongoing palliative care discussions to transition patient to comfort care.  Plan would be transition back to her facility with hospice to follow in next 1-2 days.  Assessment & Plan:    Septic shock with multiorgan dysfunction Infected large decubitus ulcer  Proteus bacteremia Initially required pressors now off.  Blood cultures have grown Proteus.  Previous provider has discussed with pharmacy staff and antibiotics were adjusted.  Patient has failed to make any meaningful recovery at this time therefore palliative care team is following.  For now antibiotics are Rocephin and Flagyl.  Will plan for total of 10 days, EOT 10/19.  Transition to p.o. Bactrim upon discharge, discussed with ID pharmacy. - Prognosis remains very poor especially in the setting of advanced dementia and advanced decubitus ulcer.   Acute metabolic encephalopathy -- slowly improving In the setting of sepsis.  She does have baseline dementia. This has  improved   Hypernatremia -Resolved   Acute hypoxic respiratory failure, improved Presented tachycardic, tachypneic, hypoxic. Patient is currently on 3 L supplemental oxygen to manage oxygen greater than 92%.  Continue to wean her oxygen.  Requested wean to 2L/min oxygen today.    AKI -- RESOLVED  Hyperkalemia-RESOLVED Admission creatinine 1.8, improved after IV fluids   Hypertension: Hold of blood pressures in the setting of septic shock, altered mental status.   Hyperlipidemia: Could consider resumption of statin once she is more alert, awake.   Advanced dementia: Patient is DNR/DNI.  Recommendations are to transition patient to comfort care.   Hypophosphatemia -- As needed repletion   Dysphagia -- Seen by speech, recommending dysphagia 1 diet.  With MBS she did quite well therefore we may be able to change her diet to dysphagia to  Prognosis remains very guarded.  Have explained that patient's condition is overall uncurable therefore we do recommend hospice services.  TOC has consulted their service.   DVT prophylaxis: sq heparin Code Status: DNRDNI  Family Communication: Family at bedside Disposition: TBD   PT Follow up Recs:   Subjective:  Doing okay no complaints.  Resting comfortably. Son and daughter at bedside.  Examination:  General exam: Appears calm and comfortable, elderly frail, chronically ill-appearing Respiratory system: Clear to auscultation. Respiratory effort normal. Cardiovascular system: S1 & S2 heard, RRR. No JVD, murmurs, rubs, gallops or clicks. No pedal edema. Gastrointestinal system: Abdomen is nondistended, soft and nontender. No organomegaly or masses felt. Normal bowel sounds heard. Central nervous system: Alert and oriented to name only Extremities: Symmetric 5 x 5 power. Skin: No rashes, lesions or ulcers Psychiatry: Judgement and insight appear poor Coccyx ulcer stage IV

## 2024-07-19 NOTE — Progress Notes (Signed)
 PROGRESS NOTE   [name]  FMW:[id number] DOB: 11-Mar-1933 DOA: 07/17/2024 PCP: Landy Barnie RAMAN, NP   Chief Complaint  Patient presents with   Altered Mental Status   Level of care: ICU  Brief Admission History:  89 y.o. female with medical history significant of past medical history significant for dementia, hypertension, hyperlipidemia, restless leg syndrome, diverticulosis who is resident of skilled nursing facility presented to the emergency department for evaluation of altered mental status, fever, tachycardia, tachypnea.  Patient has large decubitus ulcer which seems to be the source of sepsis.  Initial blood pressure is low, patient's lactic acid is 4.7, white count 26, she has AKI.  Patient will need further management evaluation of septic shock.    Patient was started on IV vancomycin , cefepime with multiple IV fluid boluses to maintain blood pressure.  Patient had high-grade fever, last night antibiotics changed to vancomycin  and meropenem.  Started on Solu-Medrol  therapy.   Assessment and Plan:  Septic shock with multiorgan dysfunction Infected large decubitus ulcer  Proteus bacteremia Presented with fever, tachycardia, tachypnea, low BP, lactic acid 4.7, white count 26, AKI, altered mental status, respiratory failure.   Sepsis physiology resolving and she is now off pressors  Blood cultures grew Proteus, follow sensitivities. Wound care ordered dressing changes, air loss mattress.  Acute metabolic encephalopathy In the setting of sepsis.  She does have baseline dementia. N.p.o. for now given her lethargy. Continue supportive care. Delirium precautions.   Acute hypoxic respiratory failure Presented tachycardic, tachypneic, hypoxic. Patient is currently on 3 L supplemental oxygen to manage oxygen greater than 92%.  Continue to wean her oxygen.  Requested wean to 2L/min oxygen today.    AKI -- RESOLVED  Hyperkalemia-RESOLVED Improved after IV fluids    Hypertension: Hold of blood pressures in the setting of septic shock, altered mental status.   Hyperlipidemia: Statins once she is more alert, awake.   Advanced dementia: Continue delirium precautions. Supportive care. Patient is DNR. Palliative consult for further goals of care  Family had expressed that if no meaningful improvement by 10/13, transition to full comfort measures   DVT prophylaxis: sq heparin Code Status: DNRDNI  Family Communication: daughter at bedside updated 10/13  Disposition: TBD   Consultants:  Palliative care  Procedures:   Antimicrobials:    Subjective: Pt vocalizes at times but remains encephalopathic  Objective: Vitals:   07/19/24 1000 07/19/24 1100 07/19/24 1126 07/19/24 1200  BP: (!) 163/93 (!) 138/92  (!) 141/85  Pulse: 92 91  86  Resp: (!) 23 17  20   Temp:   99.1 F (37.3 C)   TempSrc:   Axillary   SpO2: 100% 100%  100%  Weight:      Height:        Intake/Output Summary (Last 24 hours) at 07/19/2024 1351 Last data filed at 07/19/2024 0600 Gross per 24 hour  Intake --  Output 400 ml  Net -400 ml   Filed Weights   07/17/24 1138 07/17/24 1604  Weight: 94 kg 94 kg   Examination:  General exam: appears acutely and chronically ill.  Appears distressed.  Critically ill. Occasionally vocalizes but not able to make needs known.   Respiratory system: moderate increased work of breathing and tachypnea. Cardiovascular system: normal S1 & S2 heard.  Gastrointestinal system: Abdomen is mildly distended, soft and nontender. No organomegaly or masses felt. Normal bowel sounds heard. Central nervous system: Alert and oriented. No focal neurological deficits. Extremities: Symmetric 5 x 5 power. Skin: No  rashes, lesions or ulcers. Psychiatry: Judgement and insight unable to determine due to condition.  Wound 07/17/24 1602 Pressure Injury Coccyx Stage 4 - Full thickness tissue loss with exposed bone, tendon or muscle. (Active)   Data  Reviewed: I have personally reviewed following labs and imaging studies  CBC: Recent Labs  Lab 07/17/24 1154 07/17/24 2335 07/18/24 0035 07/19/24 0317  WBC 26.1* 30.0* 36.4* 37.5*  NEUTROABS 18.0* 22.6* 27.1*  --   HGB 12.1 11.1* 10.8* 10.3*  HCT 37.6 36.1 35.0* 31.6*  MCV 93.8 97.3 97.5 93.5  PLT 217 155 150 118*    Basic Metabolic Panel: Recent Labs  Lab 07/17/24 1154 07/17/24 2104 07/18/24 0301 07/19/24 0317  NA 147* 143 143 144  K 5.2* 4.3 4.4 4.1  CL 102 106 106 109  CO2 28 23 22 25   GLUCOSE 106* 102* 129* 151*  BUN 42* 45* 47* 36*  CREATININE 2.22* 2.25* 1.88* 0.74  CALCIUM  9.1 7.9* 8.0* 7.5*  MG  --   --  2.1  --   PHOS  --   --  3.9  --     CBG: Recent Labs  Lab 07/18/24 1958 07/19/24 0000 07/19/24 0347 07/19/24 0728 07/19/24 1119  GLUCAP 151* 154* 145* 157* 139*    Recent Results (from the past 240 hours)  Blood Culture (routine x 2)     Status: Abnormal (Preliminary result)   Collection Time: 07/17/24 11:54 AM   Specimen: BLOOD  Result Value Ref Range Status   Specimen Description   Final    BLOOD RIGHT ARM Performed at [facility], [address] [place], [place] [address]    Special Requests   Final    BOTTLES DRAWN AEROBIC AND ANAEROBIC Blood Culture adequate volume Performed at [facility], [address], [place], [place] [address]    Culture  Setup Time   Final    GRAM NEGATIVE RODS IN BOTH AEROBIC AND ANAEROBIC BOTTLES CRITICAL RESULT CALLED TO, READ BACK BY AND VERIFIED WITH: KINDLEY,C ON 07/18/24 AT 0334 BY PURDIE,J CRITICAL RESULT CALLED TO, READ BACK BY AND VERIFIED WITH: RN CATLIN ROGERS [id number] AT 0850 BY EC    Culture (A)  Final    PROTEUS MIRABILIS SUSCEPTIBILITIES TO FOLLOW Performed at [facility], [address], [place], [place] [address]    Report Status PENDING  Incomplete  Blood Culture ID Panel (Reflexed)     Status: Abnormal   Collection Time: 07/17/24 11:54 AM  Result Value Ref Range Status    Enterococcus faecalis NOT DETECTED NOT DETECTED Final   Enterococcus Faecium NOT DETECTED NOT DETECTED Final   Listeria monocytogenes NOT DETECTED NOT DETECTED Final   Staphylococcus species NOT DETECTED NOT DETECTED Final   Staphylococcus aureus (BCID) NOT DETECTED NOT DETECTED Final   Staphylococcus epidermidis NOT DETECTED NOT DETECTED Final   Staphylococcus lugdunensis NOT DETECTED NOT DETECTED Final   Streptococcus species NOT DETECTED NOT DETECTED Final   Streptococcus agalactiae NOT DETECTED NOT DETECTED Final   Streptococcus pneumoniae NOT DETECTED NOT DETECTED Final   Streptococcus pyogenes NOT DETECTED NOT DETECTED Final   A.calcoaceticus-baumannii NOT DETECTED NOT DETECTED Final   Bacteroides fragilis NOT DETECTED NOT DETECTED Final   Enterobacterales DETECTED (A) NOT DETECTED Final    Comment: Enterobacterales represent a large order of gram negative bacteria, not a single organism. CRITICAL RESULT CALLED TO, READ BACK BY AND VERIFIED WITH: RN CATLIN ROGERS [id number] AT 0850 BY EC    Enterobacter cloacae complex NOT DETECTED NOT  DETECTED Final   Escherichia coli NOT DETECTED NOT DETECTED Final   Klebsiella aerogenes NOT DETECTED NOT DETECTED Final   Klebsiella oxytoca NOT DETECTED NOT DETECTED Final   Klebsiella pneumoniae NOT DETECTED NOT DETECTED Final   Proteus species DETECTED (A) NOT DETECTED Final    Comment: CRITICAL RESULT CALLED TO, READ BACK BY AND VERIFIED WITH: RN CATLIN ROGERS [id number] AT 0850 BY EC    Salmonella species NOT DETECTED NOT DETECTED Final   Serratia marcescens NOT DETECTED NOT DETECTED Final   Haemophilus influenzae NOT DETECTED NOT DETECTED Final   Neisseria meningitidis NOT DETECTED NOT DETECTED Final   Pseudomonas aeruginosa NOT DETECTED NOT DETECTED Final   Stenotrophomonas maltophilia NOT DETECTED NOT DETECTED Final   Candida albicans NOT DETECTED NOT DETECTED Final   Candida auris NOT DETECTED NOT DETECTED Final   Candida glabrata NOT  DETECTED NOT DETECTED Final   Candida krusei NOT DETECTED NOT DETECTED Final   Candida parapsilosis NOT DETECTED NOT DETECTED Final   Candida tropicalis NOT DETECTED NOT DETECTED Final   Cryptococcus neoformans/gattii NOT DETECTED NOT DETECTED Final   CTX-M ESBL NOT DETECTED NOT DETECTED Final   Carbapenem resistance IMP NOT DETECTED NOT DETECTED Final   Carbapenem resistance KPC NOT DETECTED NOT DETECTED Final   Carbapenem resistance NDM NOT DETECTED NOT DETECTED Final   Carbapenem resist OXA 48 LIKE NOT DETECTED NOT DETECTED Final   Carbapenem resistance VIM NOT DETECTED NOT DETECTED Final    Comment: Performed at [facility], [address] [place], [place] [address]  Blood Culture (routine x 2)     Status: Abnormal (Preliminary result)   Collection Time: 07/17/24 11:59 AM   Specimen: BLOOD  Result Value Ref Range Status   Specimen Description   Final    BLOOD LEFT ARM Performed at [facility], [address], [place], [place] [address]    Special Requests   Final    BOTTLES DRAWN AEROBIC AND ANAEROBIC Blood Culture results may not be optimal due to an inadequate volume of blood received in culture bottles Performed at [facility], [address] [place], [place] [address]    Culture  Setup Time   Final    GRAM NEGATIVE RODS IN BOTH AEROBIC AND ANAEROBIC BOTTLES CRITICAL RESULT CALLED TO, READ BACK BY AND VERIFIED WITH: KINDLEY,C ON 07/18/24 AT 0334 BY PURDIE,J Performed at [facility], [address], [place], [place] [address]    Culture PROTEUS MIRABILIS (A)  Final   Report Status PENDING  Incomplete  MRSA Next Gen by PCR, Nasal     Status: None   Collection Time: 07/17/24  4:06 PM   Specimen: Nasal Mucosa; Nasal Swab  Result Value Ref Range Status   MRSA by PCR Next Gen NOT DETECTED NOT DETECTED Final    Comment: (NOTE) The GeneXpert MRSA Assay (FDA approved for NASAL specimens only), is one component of a comprehensive MRSA colonization  surveillance program. It is not intended to diagnose MRSA infection nor to guide or monitor treatment for MRSA infections. Test performance is not FDA approved in patients less than 32 years old. Performed at [facility], [address], [place], [place] [address]   Respiratory (~20 pathogens) panel by PCR     Status: None   Collection Time: 07/17/24 11:32 PM   Specimen: Nasopharyngeal Swab; Respiratory  Result Value Ref Range Status   Adenovirus NOT DETECTED NOT DETECTED Final   Coronavirus 229E NOT DETECTED NOT DETECTED Final  Comment: (NOTE) The Coronavirus on the Respiratory Panel, DOES NOT test for the novel  Coronavirus (2019 nCoV)    Coronavirus HKU1 NOT DETECTED NOT DETECTED Final   Coronavirus NL63 NOT DETECTED NOT DETECTED Final   Coronavirus OC43 NOT DETECTED NOT DETECTED Final   Metapneumovirus NOT DETECTED NOT DETECTED Final   Rhinovirus / Enterovirus NOT DETECTED NOT DETECTED Final   Influenza A NOT DETECTED NOT DETECTED Final   Influenza B NOT DETECTED NOT DETECTED Final   Parainfluenza Virus 1 NOT DETECTED NOT DETECTED Final   Parainfluenza Virus 2 NOT DETECTED NOT DETECTED Final   Parainfluenza Virus 3 NOT DETECTED NOT DETECTED Final   Parainfluenza Virus 4 NOT DETECTED NOT DETECTED Final   Respiratory Syncytial Virus NOT DETECTED NOT DETECTED Final   Bordetella pertussis NOT DETECTED NOT DETECTED Final   Bordetella Parapertussis NOT DETECTED NOT DETECTED Final   Chlamydophila pneumoniae NOT DETECTED NOT DETECTED Final   Mycoplasma pneumoniae NOT DETECTED NOT DETECTED Final    Comment: Performed at [facility], [address] [place], [place] [address]  C Difficile Quick Screen w PCR reflex     Status: Abnormal   Collection Time: 07/18/24  7:27 PM   Specimen: STOOL  Result Value Ref Range Status   C Diff antigen POSITIVE (A) NEGATIVE Final   C Diff toxin NEGATIVE NEGATIVE Final   C Diff interpretation Results are indeterminate. See PCR results.  Final     Comment: Performed at [facility], [address] [place], [place] [address]     Radiology Studies: DG CHEST PORT 1 VIEW Result Date: 07/18/2024 CLINICAL DATA:  Sepsis EXAM: PORTABLE CHEST 1 VIEW COMPARISON:  07/17/2024 FINDINGS: Cardiomegaly, vascular congestion. Aortic atherosclerosis. No confluent airspace opacities, effusions or edema. No acute bony abnormality. IMPRESSION: Cardiomegaly, vascular congestion. Electronically Signed   By: Franky Crease M.D.   On: 07/18/2024 20:51   ECHOCARDIOGRAM COMPLETE Result Date: 07/18/2024    ECHOCARDIOGRAM REPORT   Patient Name:   AMAIRANI SHUEY Date of Exam: 07/18/2024 Medical Rec #:  [id number]      Height:       68.0 in Accession #:    [id number]     Weight:       207.2 lb Date of Birth:  August 13, 1933      BSA:          2.075 m Patient Age:    89 years       BP:           131/85 mmHg Patient Gender: F              HR:           94 bpm. Exam Location:  [facility] Procedure: 2D Echo, Cardiac Doppler and Color Doppler (Both Spectral and Color            Flow Doppler were utilized during procedure). Indications:    Shock R57.9  History:        Patient has no prior history of Echocardiogram examinations.                 Risk Factors:Hypertension.  Sonographer:    Jayson Gaskins Referring Phys: [id number] BRENDA MORRISON IMPRESSIONS  1. Left ventricular ejection fraction, by estimation, is 20 to 25%. The left ventricle has severely decreased function. The left ventricle demonstrates global hypokinesis. Left ventricular diastolic parameters are indeterminate.  2. Right ventricular systolic function is moderately reduced. The right ventricular size is normal. There is  normal pulmonary artery systolic pressure. The estimated right ventricular systolic pressure is 30.4 mmHg.  3. The mitral valve is grossly normal. Mild mitral valve regurgitation.  4. The aortic valve is tricuspid. Aortic valve regurgitation is trivial. Aortic valve sclerosis is present, with no evidence of  aortic valve stenosis. Aortic valve mean gradient measures 5.0 mmHg.  5. The inferior vena cava is dilated in size with <50% respiratory variability, suggesting right atrial pressure of 15 mmHg. Comparison(s): No prior Echocardiogram. FINDINGS  Left Ventricle: Left ventricular ejection fraction, by estimation, is 20 to 25%. The left ventricle has severely decreased function. The left ventricle demonstrates global hypokinesis. The left ventricular internal cavity size was normal in size. There is no left ventricular hypertrophy. Left ventricular diastolic parameters are indeterminate. Right Ventricle: The right ventricular size is normal. No increase in right ventricular wall thickness. Right ventricular systolic function is moderately reduced. There is normal pulmonary artery systolic pressure. The tricuspid regurgitant velocity is 1.96 m/s, and with an assumed right atrial pressure of 15 mmHg, the estimated right ventricular systolic pressure is 30.4 mmHg. Left Atrium: Left atrial size was normal in size. Right Atrium: Right atrial size was normal in size. Pericardium: There is no evidence of pericardial effusion. Mitral Valve: The mitral valve is grossly normal. Mild mitral valve regurgitation. Tricuspid Valve: The tricuspid valve is grossly normal. Tricuspid valve regurgitation is trivial. Aortic Valve: The aortic valve is tricuspid. There is mild aortic valve annular calcification. Aortic valve regurgitation is trivial. Aortic valve sclerosis is present, with no evidence of aortic valve stenosis. Aortic valve mean gradient measures 5.0 mmHg. Aortic valve peak gradient measures 8.1 mmHg. Aortic valve area, by VTI measures 2.05 cm. Pulmonic Valve: The pulmonic valve was not well visualized. Pulmonic valve regurgitation is mild. Aorta: The aortic root is normal in size and structure. Venous: The inferior vena cava is dilated in size with less than 50% respiratory variability, suggesting right atrial pressure of 15  mmHg. IAS/Shunts: No atrial level shunt detected by color flow Doppler. Additional Comments: 3D was performed not requiring image post processing on an independent workstation and was indeterminate.  LEFT VENTRICLE PLAX 2D LVIDd:         5.00 cm LVIDs:         4.50 cm LV PW:         0.80 cm LV IVS:        1.00 cm LVOT diam:     2.00 cm LV SV:         56 LV SV Index:   27 LVOT Area:     3.14 cm  LEFT ATRIUM             Index        RIGHT ATRIUM           Index LA Vol (A2C):   35.6 ml 17.16 ml/m  RA Area:     13.70 cm LA Vol (A4C):   31.5 ml 15.18 ml/m  RA Volume:   31.90 ml  15.37 ml/m LA Biplane Vol: 36.8 ml 17.74 ml/m  AORTIC VALVE AV Area (Vmax):    2.00 cm AV Area (Vmean):   2.17 cm AV Area (VTI):     2.05 cm AV Vmax:           142.00 cm/s AV Vmean:          108.000 cm/s AV VTI:            0.273 m AV Peak Grad:  8.1 mmHg AV Mean Grad:      5.0 mmHg LVOT Vmax:         90.40 cm/s LVOT Vmean:        74.600 cm/s LVOT VTI:          0.178 m LVOT/AV VTI ratio: 0.65  AORTA Ao Root diam: 2.90 cm MITRAL VALVE                TRICUSPID VALVE MV Area (PHT): 9.85 cm     TR Peak grad:   15.4 mmHg MV Decel Time: 77 msec      TR Vmax:        196.00 cm/s MV E velocity: 105.00 cm/s                             SHUNTS                             Systemic VTI:  0.18 m                             Systemic Diam: 2.00 cm Jayson Sierras MD Electronically signed by Jayson Sierras MD Signature Date/Time: 07/18/2024/2:19:17 PM    Final     Scheduled Meds:  Chlorhexidine  Gluconate Cloth  6 each Topical Q0600   heparin  5,000 Units Subcutaneous Q8H   liver oil-zinc oxide   Topical BID   magic mouthwash  10 mL Oral QID   pantoprazole  (PROTONIX ) IV  40 mg Intravenous Q12H   Continuous Infusions:  dextrose  50 mL/hr at 07/19/24 1113   fluconazole (DIFLUCAN) IV 200 mg (07/19/24 0013)   meropenem (MERREM) IV 1 g (07/19/24 0802)     LOS: 2 days   Critical Care Procedure Note Authorized and Performed by: KYM Louder  MD  Total Critical Care time:  55 mins Due to a high probability of clinically significant, life threatening deterioration, the patient required my highest level of preparedness to intervene emergently and I personally spent this critical care time directly and personally managing the patient.  This critical care time included obtaining a history; examining the patient, pulse oximetry; ordering and review of studies; arranging urgent treatment with development of a management plan; evaluation of patient's response of treatment; frequent reassessment; and discussions with other providers.  This critical care time was performed to assess and manage the high probability of imminent and life threatening deterioration that could result in multi-organ failure.  It was exclusive of separately billable procedures and treating other patients and teaching time.    Afton Louder, MD How to contact the [facility] Attending or Consulting provider 7A - 7P or covering provider during after hours 7P -7A, for this patient?  Check the care team in [facility] and look for a) attending/consulting [facility] provider listed and b) the [facility] team listed Log into [email or web address] to find provider on call.  Locate the [facility] provider you are looking for under [facility] and page to a number that you can be directly reached. If you still have difficulty reaching the provider, please page the [facility] (Director on Call) for the Hospitalists listed on amion for assistance.  07/19/2024, 1:51 PM

## 2024-07-19 NOTE — Plan of Care (Signed)

## 2024-07-19 NOTE — Plan of Care (Signed)
  Problem: Fluid Volume: Goal: Hemodynamic stability will improve Outcome: Progressing   Problem: Pain Managment: Goal: General experience of comfort will improve and/or be controlled Outcome: Progressing   Problem: Safety: Goal: Ability to remain free from injury will improve Outcome: Progressing

## 2024-07-20 ENCOUNTER — Inpatient Hospital Stay (HOSPITAL_COMMUNITY)

## 2024-07-20 DIAGNOSIS — Z515 Encounter for palliative care: Secondary | ICD-10-CM | POA: Diagnosis not present

## 2024-07-20 DIAGNOSIS — N179 Acute kidney failure, unspecified: Secondary | ICD-10-CM | POA: Diagnosis not present

## 2024-07-20 DIAGNOSIS — G9341 Metabolic encephalopathy: Secondary | ICD-10-CM | POA: Diagnosis not present

## 2024-07-20 DIAGNOSIS — L89304 Pressure ulcer of unspecified buttock, stage 4: Secondary | ICD-10-CM

## 2024-07-20 DIAGNOSIS — Z7189 Other specified counseling: Secondary | ICD-10-CM

## 2024-07-20 DIAGNOSIS — G20A2 Parkinson's disease without dyskinesia, with fluctuations: Secondary | ICD-10-CM | POA: Diagnosis not present

## 2024-07-20 DIAGNOSIS — Z558 Other problems related to education and literacy: Secondary | ICD-10-CM

## 2024-07-20 DIAGNOSIS — Z66 Do not resuscitate: Secondary | ICD-10-CM

## 2024-07-20 DIAGNOSIS — Z789 Other specified health status: Secondary | ICD-10-CM

## 2024-07-20 DIAGNOSIS — A419 Sepsis, unspecified organism: Secondary | ICD-10-CM | POA: Diagnosis not present

## 2024-07-20 LAB — CBC WITH DIFFERENTIAL/PLATELET
Abs Immature Granulocytes: 0.4 K/uL — ABNORMAL HIGH (ref 0.00–0.07)
Basophils Absolute: 0.2 K/uL — ABNORMAL HIGH (ref 0.0–0.1)
Basophils Relative: 1 %
Eosinophils Absolute: 0 K/uL (ref 0.0–0.5)
Eosinophils Relative: 0 %
HCT: 32.8 % — ABNORMAL LOW (ref 36.0–46.0)
Hemoglobin: 10.6 g/dL — ABNORMAL LOW (ref 12.0–15.0)
Immature Granulocytes: 1 %
Lymphocytes Relative: 5 %
Lymphs Abs: 1.5 K/uL (ref 0.7–4.0)
MCH: 30.2 pg (ref 26.0–34.0)
MCHC: 32.3 g/dL (ref 30.0–36.0)
MCV: 93.4 fL (ref 80.0–100.0)
Monocytes Absolute: 1.6 K/uL — ABNORMAL HIGH (ref 0.1–1.0)
Monocytes Relative: 5 %
Neutro Abs: 30 K/uL — ABNORMAL HIGH (ref 1.7–7.7)
Neutrophils Relative %: 88 %
Platelets: 116 K/uL — ABNORMAL LOW (ref 150–400)
RBC: 3.51 MIL/uL — ABNORMAL LOW (ref 3.87–5.11)
RDW: 14.9 % (ref 11.5–15.5)
WBC: 33.8 K/uL — ABNORMAL HIGH (ref 4.0–10.5)
nRBC: 0 % (ref 0.0–0.2)

## 2024-07-20 LAB — COMPREHENSIVE METABOLIC PANEL WITH GFR
ALT: 60 U/L — ABNORMAL HIGH (ref 0–44)
AST: 38 U/L (ref 15–41)
Albumin: 3 g/dL — ABNORMAL LOW (ref 3.5–5.0)
Alkaline Phosphatase: 190 U/L — ABNORMAL HIGH (ref 38–126)
Anion gap: 8 (ref 5–15)
BUN: 27 mg/dL — ABNORMAL HIGH (ref 8–23)
CO2: 28 mmol/L (ref 22–32)
Calcium: 7.8 mg/dL — ABNORMAL LOW (ref 8.9–10.3)
Chloride: 110 mmol/L (ref 98–111)
Creatinine, Ser: 0.81 mg/dL (ref 0.44–1.00)
GFR, Estimated: >60 mL/min (ref >60)
Glucose, Bld: 127 mg/dL — ABNORMAL HIGH (ref 70–99)
Potassium: 3.6 mmol/L (ref 3.5–5.1)
Sodium: 146 mmol/L — ABNORMAL HIGH (ref 135–145)
Total Bilirubin: 0.5 mg/dL (ref 0.0–1.2)
Total Protein: 5.9 g/dL — ABNORMAL LOW (ref 6.5–8.1)

## 2024-07-20 LAB — GLUCOSE, CAPILLARY
Glucose-Capillary: 117 mg/dL — ABNORMAL HIGH (ref 70–99)
Glucose-Capillary: 120 mg/dL — ABNORMAL HIGH (ref 70–99)
Glucose-Capillary: 122 mg/dL — ABNORMAL HIGH (ref 70–99)
Glucose-Capillary: 133 mg/dL — ABNORMAL HIGH (ref 70–99)
Glucose-Capillary: 140 mg/dL — ABNORMAL HIGH (ref 70–99)
Glucose-Capillary: 143 mg/dL — ABNORMAL HIGH (ref 70–99)

## 2024-07-20 LAB — MAGNESIUM: Magnesium: 2.3 mg/dL (ref 1.7–2.4)

## 2024-07-20 LAB — PHOSPHORUS: Phosphorus: 1.1 mg/dL — ABNORMAL LOW (ref 2.5–4.6)

## 2024-07-20 LAB — PREALBUMIN: Prealbumin: 10 mg/dL — ABNORMAL LOW (ref 18–38)

## 2024-07-20 LAB — LEVETIRACETAM LEVEL: Levetiracetam Lvl: 10.7 ug/mL (ref 10.0–40.0)

## 2024-07-20 MED ORDER — FUROSEMIDE 10 MG/ML IJ SOLN
20.0000 mg | Freq: Once | INTRAMUSCULAR | Status: AC
  Administered 2024-07-20: 20 mg via INTRAVENOUS
  Filled 2024-07-20: qty 2

## 2024-07-20 MED ORDER — SODIUM CHLORIDE 0.9 % IV SOLN
2.0000 g | INTRAVENOUS | Status: DC
  Administered 2024-07-20 – 2024-07-23 (×3): 2 g via INTRAVENOUS
  Filled 2024-07-20 (×3): qty 20

## 2024-07-20 MED ORDER — METRONIDAZOLE 500 MG/100ML IV SOLN
500.0000 mg | Freq: Two times a day (BID) | INTRAVENOUS | Status: DC
  Administered 2024-07-20 – 2024-07-24 (×8): 500 mg via INTRAVENOUS
  Filled 2024-07-20 (×8): qty 100

## 2024-07-20 MED ORDER — ACETAMINOPHEN 325 MG PO TABS: 650.0000 mg | ORAL_TABLET | Freq: Four times a day (QID) | ORAL | Status: DC | PRN

## 2024-07-20 MED ORDER — ACETAMINOPHEN 650 MG RE SUPP
650.0000 mg | RECTAL | Status: DC | PRN
  Administered 2024-07-20: 650 mg via RECTAL
  Filled 2024-07-20: qty 1

## 2024-07-20 MED ORDER — IPRATROPIUM-ALBUTEROL 0.5-2.5 (3) MG/3ML IN SOLN
3.0000 mL | Freq: Four times a day (QID) | RESPIRATORY_TRACT | Status: DC
  Administered 2024-07-20 – 2024-07-21 (×5): 3 mL via RESPIRATORY_TRACT
  Filled 2024-07-20 (×5): qty 3

## 2024-07-20 MED ORDER — POTASSIUM PHOSPHATES 15 MMOLE/5ML IV SOLN
30.0000 mmol | Freq: Once | INTRAVENOUS | Status: AC
  Administered 2024-07-20: 30 mmol via INTRAVENOUS
  Filled 2024-07-20: qty 10

## 2024-07-20 MED ORDER — DEXTROSE-SODIUM CHLORIDE 5-0.45 % IV SOLN: INTRAVENOUS | Status: DC

## 2024-07-20 NOTE — Plan of Care (Signed)
   Problem: Activity: Goal: Risk for activity intolerance will decrease Outcome: Progressing   Problem: Coping: Goal: Level of anxiety will decrease Outcome: Progressing

## 2024-07-20 NOTE — Progress Notes (Signed)
Bipap order is PRN.  Not needed at this time. 

## 2024-07-20 NOTE — Progress Notes (Signed)
 Did not needed BiPAP during night.

## 2024-07-20 NOTE — Progress Notes (Signed)
   07/20/24 1224  Spiritual Encounters  Type of Visit Initial  Care provided to: Pt and family (Son--Alfred)  Conversation partners present during encounter Nurse  Referral source IDT Rounds  Reason for visit Urgent spiritual support  OnCall Visit No  Spiritual Framework  Presenting Themes Meaning/purpose/sources of inspiration;Impactful experiences and emotions;Goals in life/care;Courage hope and growth;Significant life change;Values and beliefs  Community/Connection Family;Faith community  Strengths Patient and family have good spiritual resources for coping  Needs/Challenges/Barriers Patient son, Sim shares that he is trying to remain positive but that he is aware of how sick his mother is and he is working through his grief at this time. He simply doesn't want her to suffer needlessly.  Patient Stress Factors None identified  Family Stress Factors Loss;Loss of control;Major life changes  Interventions  Spiritual Care Interventions Made Established relationship of care and support;Compassionate presence;Reflective listening;Normalization of emotions;Narrative/life review;Meaning making;Bereavement/grief support;Prayer  Intervention Outcomes  Outcomes Connection to spiritual care;Awareness of support;Patient family open to resources;Awareness around self/spiritual resourses  Spiritual Care Plan  Spiritual Care Issues Still Outstanding Chaplain will continue to follow  Follow up plan  Will remain available in order to provide spiritual support and to assess for spiritual need    Met with patient son Sim today while patient was resting comfortably. Engaged him in reflection around his mothers life, upbringing, spiritual heritage, and illness story. Themes during visit include grief, need for life review, time for outcomes, and meaning making. Chaplain provided blessing at end of visit and will remain available in order to provide spiritual support and to assess for spiritual need.    Rev. Ether Blush, M.Div Chaplain [phone]

## 2024-07-20 NOTE — Plan of Care (Signed)

## 2024-07-20 NOTE — Consult Note (Signed)
 Consultation Note Date: 07/20/2024   Patient Name: Alejandra Cruz  DOB: [birth year]  MRN: [id number]  Age / Sex: 89 y.o., female  PCP: Alejandra Barnie RAMAN, NP Referring Physician: Vicci Afton CROME, MD  Reason for Consultation: Establishing goals of care  HPI/Patient Profile: 89 y.o. female  with past medical history of dementia, hypertension, hyperlipidemia, restless leg syndrome, diverticulosis admitted on 07/17/2024 with altered mental status.   Workup revealed findings concerning for septic shock with multiorgan dysfunction septic shock with multiorgan dysfunction secondary to infected large decubitus ulcer with Proteus bacteremia.  Initially required vasopressor support.  Also requiring supplemental O2.  Initiated on broad-spectrum antibiotics.  Outside records reviewed.  I see that patient [facility] seen for nausea vomiting as well as some pain.  Scheduled Tylenol  was increased.  I see notes from 06/23/2024 where stage II pressure injury as mentioned.  Per notes, appears to have significant dementia at baseline.  PMT has been consulted to assist with goals of care conversation.  Today, labs independently reviewed.  Sodium level slightly elevated from previous although down from admission level (current: 146, 10/12: 144, admission: 147).  Likely in the setting of n.p.o. status given lethargy.  Glucose level minimally elevated at 127.  Renal function stable/improving with creatinine 0.81 today (0.74<<1.88<<2.25<<2.22-10/10).  Calcium  level low in the setting of hypoalbuminemia.  Calcium  corrects to 8.6.  Total protein low at 5.9 and albumin level low at 3. Low albumin levels are associated with greater disease burden and poorer long-term prognosis.  ALT and ALP slightly elevated with normal bilirubin and AST.  Trending up since admission.  Would recommend continuing to monitor.  Chest x-ray obtained 07/18/2024  independently reviewed.  Heart appears enlarged.  Some evidence of increased blood volume and pulmonary vessels noted.  No readily consolidation visible.  Vital signs reviewed.  Overall, vital signs appear relatively stable.  Continues to require some supplemental O2 via nasal cannula.  Occasionally mildly hypertensive and tachypneic but otherwise vital stable.  Medication administration record reviewed.  Has been able to wean off of vasopressor support.  Has received a total of 650 mg of rectal Tylenol  on 24-hour look back otherwise, no as needed symptom meds administered.  Independent history obtained from nursing and family given patient's baseline dementia.  Per nursing patient has been largely unresponsive and remains NPO. She has appeared comfortable. Family has been at bedside visiting with patient.   Clinical Assessment and Goals of Care:  I have reviewed medical records including EPIC notes, labs and imaging (independently reviewed), outside notes, MAR, and vital signs, assessed the patient and then met with patient and her son to discuss diagnosis prognosis, GOC, EOL wishes, disposition and options. Collaborated directly with attending physician, TOC, and bedside nursing staff.   I introduced Palliative Medicine as specialized medical care for people living with serious illness. It focuses on providing relief from the symptoms and stress of a serious illness. The goal is to improve quality of life for both the patient and the family.  We discussed a brief life review of the patient and then focused on their current illness.   I attempted to elicit values and goals of care important to the patient.    Medical History Review and Family/Patient Understanding:   Patient's son at bedside. He seems to have a fair understanding of her current condition. He states he is having a hard time accepting where she is but does understand the gravity  of the situation.   Social History:  Single mom to  4 children and has lots of grandchildren and great grandchildren. 3 children live locally and one is in [place] . All are supportive and involved in her care. 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 She was a wonderful mom and is a spiritual person.   Functional and Nutritional State:  Prior to this hospitalization, functional status was quite dependent even before admission. Largely bed bound, incontinent of bowel and bladder, total care for all ADLs, occasionally able to feed herself but often required at a minimum oversight and occasionally assistance.   Palliative Symptoms:  None currently  Advance Directives/Goals of Care/Anticipatory care planning Discussion:  The family consented to a voluntary Advance Care Planning Conversation in person/over the phone. Individuals present for the conversation: son.Alejandra Cruz (in person), [name] (by phone)  A detailed discussion regarding GOC, advanced directives, and anticipatory care planning was had. With permission, engaged in a detailed conversation with the family about the seriousness of the patient's current illness, in light of her advanced age and complex medical history, and explained the potential implications this may have for her recovery and long-term well-being. Discussed dementia disease trajectory, how acute illnesses/injuries can impact disease trajectory. Also discussed that given current clinical status we are worried she is in the end stages of dementia.   Discussed goals of care and quality of life. Goals are for her to remain comfortable and have good quality of life. Family continues to hope that patient will improve. We discussed that patient was always a hard worker, strong, and never sat still. We discussed that for her she would likely not desire a life where she was confined bed/suffering. Given GOC and QOL, We discussed the limitations and potential burdens of CPR and intubation, particularly in  the context of advanced age and serious underlying health conditions. Son confirmed DNR/DNI status.  The difference between aggressive medical intervention and comfort care was considered in light of the patient's goals of care. Patient's son and daughter would like to continue current POC and would like more time for outcomes. They are aware that she has been unsafe to eat thus far. We discussed how dementia can impact ability to swallow safely at end stages. Discussed the benefits and drawbacks of artificial nutrition and hydration. Specifically the limitations of feeding tubes in those with advanced and irreversible diseases such as dementia. Son and daughter both agree that feeding tube is not appropriate. They are interested in SLP consult to evaluate ability to swallow safely.   We discussed additional time for outcomes 24-48 hrs. If no improvements with change in abx therapy and if she continues to have poor PO intake transition to comfort care and possible discharge with hospice. Detailed explanation of hospice provided including the hospice care philosophy, emphasizing its commitment to comfort, enhancing quality of life, and providing emotional and practical support to patients and their families during end-of-life care. Reviewed eligibility requirements and discussed the potential advantages of enrolling in hospice services. Discussed various locations where hospice can take place. Family is in agreement that if she does not improve they will consider hospice at discharge. Depending on response to treatment.   Discussed the importance of continued conversation with family and the medical providers regarding overall plan of care and treatment options, ensuring decisions are within the context of the patient's values and GOCs.   Questions and concerns were addressed.  The family was encouraged  to call with questions or concerns.  PMT will continue to support holistically.  I spent 35 minutes  providing separately identifiable ACP services with the patient and/or surrogate decision maker in a voluntary, in-person conversation discussing the patient's wishes and goals as detailed in the above note.  Primary Decision maker and health care surrogate:  NEXT OF KIN ([name])- not HCPOA confirmed but health surrogate  Code Status:  DNR/DNI    SUMMARY OF RECOMMENDATIONS    DNR/DNI Time for outcomes SLP consult for swallowing evaluation Depending on response to therapy consider discharge with hospice vs OP Palliative Palliative medicine team will continue to follow for ongoing goals of care discussion, symptom management, and coordination of care.   Code Status/Advance Care Planning:  DNR   Symptom Management:  Symptoms stable at present, therefore continue symptom regimen per admitting team with PMT available as needed for support   Palliative Prophylaxis:  Aspiration  Prognosis:  Guarded   Discharge Planning: To Be Determined      Primary Diagnoses: Present on Admission:  Septic shock (HCC)  Parkinson disease (HCC)  Obesity (BMI 30-39.9)    Physical Exam Constitutional:      General: She is not in acute distress.    Appearance: She is ill-appearing.  Pulmonary:     Effort: Pulmonary effort is normal. No respiratory distress.  Skin:    General: Skin is warm and dry.  Neurological:     Mental Status: She is lethargic.     Comments: Does not arouse to verbal stimuli or follow commands      Vital Signs: BP 134/89   Pulse 75   Temp 99 F (37.2 C) (Axillary)   Resp 19   Ht 5' 8 (1.727 m)   Wt 94 kg   SpO2 100%   BMI 31.51 kg/m  Pain Scale: Faces POSS *See Group Information*: 1-Acceptable,Awake and alert Pain Score: 0-No pain   SpO2: SpO2: 100 % O2 Device:SpO2: 100 % O2 Flow Rate: .O2 Flow Rate (L/min): 2 L/min   Palliative Assessment/Data: current 10-20%    Billing based on MDM: High  Problems Addressed: One acute  or chronic illness or injury that poses a threat to life or bodily function  Amount and/or Complexity of Data: Category 1:Review of prior external note(s) from each unique source and Assessment requiring an independent historian(s), Category 2:Independent interpretation of a test performed by another physician/other qualified health care professional (not separately reported), and Category 3:Discussion of management or test interpretation with external physician/other qualified health care professional/appropriate source (not separately reported)  Risks: n/a   Alejandra CHRISTELLA Pinal, NP  Palliative Medicine Team Team phone # [phone]  Thank you for allowing the Palliative Medicine Team to assist in the care of this patient. Please utilize secure chat with additional questions, if there is no response within 30 minutes please call the above phone number.  Palliative Medicine Team providers are available by phone from 7am to 7pm daily and can be reached through the team cell phone.  Should this patient require assistance outside of these hours, please call the patient's attending physician.

## 2024-07-20 NOTE — Progress Notes (Addendum)
 PROGRESS NOTE   Alejandra Cruz  FMW:[id number] DOB: 02-Jul-1933 DOA: 07/17/2024 PCP: Landy Barnie RAMAN, NP   Chief Complaint  Patient presents with   Altered Mental Status   Level of care: Telemetry  Brief Admission History:  89 y.o. female with medical history significant of past medical history significant for dementia, hypertension, hyperlipidemia, restless leg syndrome, diverticulosis who is resident of skilled nursing facility presented to the emergency department for evaluation of altered mental status, fever, tachycardia, tachypnea.  Patient has large decubitus ulcer which seems to be the source of sepsis.  Initial blood pressure is low, patient's lactic acid is 4.7, white count 26, she has AKI.  Patient will need further management evaluation of septic shock.    Patient was started on IV vancomycin , cefepime with multiple IV fluid boluses to maintain blood pressure.  Patient had high-grade fever, last night antibiotics changed to vancomycin  and meropenem.  Started on Solu-Medrol  therapy.   Assessment and Plan:  Septic shock with multiorgan dysfunction Infected large decubitus ulcer  Proteus bacteremia Presented with fever, tachycardia, tachypnea, low BP, lactic acid 4.7, white count 26, AKI, altered mental status, respiratory failure.   Sepsis physiology resolving and she is now off pressors  Blood cultures grew Proteus, follow sensitivities. Wound care ordered dressing changes, air loss mattress. 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   Acute  metabolic encephalopathy In the setting of sepsis.  She does have baseline dementia. N.p.o. for now given her lethargy. Continue supportive care. Delirium precautions.   Acute hypoxic respiratory failure Presented tachycardic, tachypneic, hypoxic. Patient is currently on 3 L supplemental oxygen to manage oxygen greater than 92%.  Continue to wean her oxygen.  Requested wean to 2L/min oxygen today.    AKI -- RESOLVED  Hyperkalemia-RESOLVED Improved after IV fluids   Hypertension: Hold of blood pressures in the setting of septic shock, altered mental status.   Hyperlipidemia: Statins once she is more alert, awake.   Advanced dementia: Continue delirium precautions. Supportive care. Patient is DNR. Palliative consult for further goals of care  Family had expressed that if no meaningful improvement by 10/13, transition to full comfort measures, goals of care discussion with family via palliative medicine consult today   Hypophosphatemia -- IV replacement ordered -- recheck in AM   DVT prophylaxis: sq heparin Code Status: DNRDNI  Family Communication: daughter at bedside updated 10/13  Disposition: TBD   Consultants:  Palliative care  Procedures:   Antimicrobials:    Subjective: Pt unresponsive today.  Open mouth breathing only.   Objective: Vitals:   07/20/24 0900 07/20/24 1000 07/20/24 1100 07/20/24 1137  BP: (!) 162/93 134/89 119/61   Pulse: 85 75 67   Resp: 19 19 17    Temp:    98.8 F (37.1 C)  TempSrc:    Axillary  SpO2: 100% 100% 100%   Weight:      Height:        Intake/Output Summary (Last 24 hours) at 07/20/2024 1242 Last data filed at 07/20/2024 0900 Gross per 24 hour  Intake 590.39 ml  Output 1300  ml  Net -709.61 ml   Filed Weights   07/17/24 1138 07/17/24 1604  Weight: 94 kg 94 kg   Examination:  General exam: appears acutely and chronically ill.  Appears distressed.  Nonverbal and somnolent.  Clearly not able to make needs known.    Respiratory system: shallow breaths but no increased WOB seen, mouth breathing only. Cardiovascular system: normal S1 & S2 heard.  Gastrointestinal system: Abdomen is soft. No organomegaly or masses felt. Normal bowel sounds heard. Central nervous system: somnolent. No focal neurological deficits. Extremities: no gross abnormalities. Skin: stage 4 coccyx wound  Psychiatry: Judgement and insight unable to determine due to condition.  Wound 07/17/24 1602 Pressure Injury Coccyx Stage 4 - Full thickness tissue loss with exposed bone, tendon or muscle. (Active)   Data Reviewed: I have personally reviewed following labs and imaging studies  CBC: Recent Labs  Lab 07/17/24 1154 07/17/24 2335 07/18/24 0035 07/19/24 0317 07/20/24 0421  WBC 26.1* 30.0* 36.4* 37.5* 33.8*  NEUTROABS 18.0* 22.6* 27.1*  --  30.0*  HGB 12.1 11.1* 10.8* 10.3* 10.6*  HCT 37.6 36.1 35.0* 31.6* 32.8*  MCV 93.8 97.3 97.5 93.5 93.4  PLT 217 155 150 118* 116*    Basic Metabolic Panel: Recent Labs  Lab 07/17/24 1154 07/17/24 2104 07/18/24 0301 07/19/24 0317 07/20/24 0421  NA 147* 143 143 144 146*  K 5.2* 4.3 4.4 4.1 3.6  CL 102 106 106 109 110  CO2 28 23 22 25 28   GLUCOSE 106* 102* 129* 151* 127*  BUN 42* 45* 47* 36* 27*  CREATININE 2.22* 2.25* 1.88* 0.74 0.81  CALCIUM  9.1 7.9* 8.0* 7.5* 7.8*  MG  --   --  2.1  --  2.3  PHOS  --   --  3.9  --  1.1*    CBG: Recent Labs  Lab 07/19/24 2002 07/19/24 2328 07/20/24 0416 07/20/24 0731 07/20/24 1101  GLUCAP 136* 124* 133* 143* 117*    Recent Results (from the past 240 hours)  Blood Culture (routine x 2)     Status: Abnormal (Preliminary result)   Collection Time: 07/17/24 11:54 AM   Specimen: BLOOD  Result Value Ref Range Status   Specimen Description   Final    BLOOD RIGHT ARM Performed at [facility], [address], [place], [place] [address]    Special Requests   Final    BOTTLES DRAWN AEROBIC AND ANAEROBIC Blood Culture adequate  volume Performed at [facility], [address] [place], [place] [address]    Culture  Setup Time   Final    GRAM NEGATIVE RODS IN BOTH AEROBIC AND ANAEROBIC BOTTLES CRITICAL RESULT CALLED TO, READ BACK BY AND VERIFIED WITH: KINDLEY,C ON 07/18/24 AT 0334 BY PURDIE,J CRITICAL RESULT CALLED TO, READ BACK BY AND VERIFIED WITH: RN CATLIN ROGERS [id number] AT 0850 BY EC    Culture (A)  Final    PROTEUS MIRABILIS CULTURE REINCUBATED FOR BETTER GROWTH Performed at [facility], [address] [place], [place] [address]    Report Status PENDING  Incomplete   Organism ID, Bacteria PROTEUS MIRABILIS  Final      Susceptibility   Proteus mirabilis - MIC*    AMPICILLIN <=2 SENSITIVE Sensitive     CEFAZOLIN  (NON-URINE) 4 INTERMEDIATE Intermediate     CEFEPIME <=0.12 SENSITIVE Sensitive     ERTAPENEM <=0.12 SENSITIVE Sensitive     CEFTRIAXONE <=0.25 SENSITIVE Sensitive     CIPROFLOXACIN 2 RESISTANT Resistant     GENTAMICIN <=1  SENSITIVE Sensitive     MEROPENEM 1 SENSITIVE Sensitive     TRIMETH/SULFA <=20 SENSITIVE Sensitive     AMPICILLIN/SULBACTAM <=2 SENSITIVE Sensitive     PIP/TAZO Value in next row Sensitive      <=4 SENSITIVEThis is a modified FDA-approved test that has been validated and its performance characteristics determined by the reporting laboratory.  This laboratory is certified under the Clinical Laboratory Improvement Amendments CLIA as qualified to perform high complexity clinical laboratory testing.    * PROTEUS MIRABILIS  Blood Culture ID Panel (Reflexed)     Status: Abnormal   Collection Time: 07/17/24 11:54 AM  Result Value Ref Range Status   Enterococcus faecalis NOT DETECTED NOT DETECTED Final   Enterococcus Faecium NOT DETECTED NOT DETECTED Final   Listeria monocytogenes NOT DETECTED NOT DETECTED Final   Staphylococcus species NOT DETECTED NOT DETECTED Final   Staphylococcus aureus (BCID) NOT DETECTED NOT DETECTED Final   Staphylococcus epidermidis NOT DETECTED  NOT DETECTED Final   Staphylococcus lugdunensis NOT DETECTED NOT DETECTED Final   Streptococcus species NOT DETECTED NOT DETECTED Final   Streptococcus agalactiae NOT DETECTED NOT DETECTED Final   Streptococcus pneumoniae NOT DETECTED NOT DETECTED Final   Streptococcus pyogenes NOT DETECTED NOT DETECTED Final   A.calcoaceticus-baumannii NOT DETECTED NOT DETECTED Final   Bacteroides fragilis NOT DETECTED NOT DETECTED Final   Enterobacterales DETECTED (A) NOT DETECTED Final    Comment: Enterobacterales represent a large order of gram negative bacteria, not a single organism. CRITICAL RESULT CALLED TO, READ BACK BY AND VERIFIED WITH: RN EDMONIA GOSLING [id number] AT 0850 BY EC    Enterobacter cloacae complex NOT DETECTED NOT DETECTED Final   Escherichia coli NOT DETECTED NOT DETECTED Final   Klebsiella aerogenes NOT DETECTED NOT DETECTED Final   Klebsiella oxytoca NOT DETECTED NOT DETECTED Final   Klebsiella pneumoniae NOT DETECTED NOT DETECTED Final   Proteus species DETECTED (A) NOT DETECTED Final    Comment: CRITICAL RESULT CALLED TO, READ BACK BY AND VERIFIED WITH: RN CATLIN ROGERS [id number] AT 0850 BY EC    Salmonella species NOT DETECTED NOT DETECTED Final   Serratia marcescens NOT DETECTED NOT DETECTED Final   Haemophilus influenzae NOT DETECTED NOT DETECTED Final   Neisseria meningitidis NOT DETECTED NOT DETECTED Final   Pseudomonas aeruginosa NOT DETECTED NOT DETECTED Final   Stenotrophomonas maltophilia NOT DETECTED NOT DETECTED Final   Candida albicans NOT DETECTED NOT DETECTED Final   Candida auris NOT DETECTED NOT DETECTED Final   Candida glabrata NOT DETECTED NOT DETECTED Final   Candida krusei NOT DETECTED NOT DETECTED Final   Candida parapsilosis NOT DETECTED NOT DETECTED Final   Candida tropicalis NOT DETECTED NOT DETECTED Final   Cryptococcus neoformans/gattii NOT DETECTED NOT DETECTED Final   CTX-M ESBL NOT DETECTED NOT DETECTED Final   Carbapenem resistance IMP NOT  DETECTED NOT DETECTED Final   Carbapenem resistance KPC NOT DETECTED NOT DETECTED Final   Carbapenem resistance NDM NOT DETECTED NOT DETECTED Final   Carbapenem resist OXA 48 LIKE NOT DETECTED NOT DETECTED Final   Carbapenem resistance VIM NOT DETECTED NOT DETECTED Final    Comment: Performed at [facility], [address] [place], [place] [address]  Blood Culture (routine x 2)     Status: Abnormal (Preliminary result)   Collection Time: 07/17/24 11:59 AM   Specimen: BLOOD  Result Value Ref Range Status   Specimen Description   Final    BLOOD LEFT ARM Performed at [facility], [address], [place], [place] [address]    Special Requests   Final    BOTTLES DRAWN AEROBIC AND ANAEROBIC Blood Culture results may not be optimal due to an inadequate volume of blood received in culture bottles Performed at [facility], [address] [place], [place] [address]    Culture  Setup Time   Final    GRAM NEGATIVE RODS IN BOTH AEROBIC AND ANAEROBIC BOTTLES CRITICAL RESULT CALLED TO, READ BACK BY AND VERIFIED WITH: KINDLEY,C ON 07/18/24 AT 0334 BY PURDIE,J    Culture (A)  Final    PROTEUS MIRABILIS SUSCEPTIBILITIES PERFORMED ON PREVIOUS CULTURE WITHIN THE LAST 5 DAYS. CULTURE REINCUBATED FOR BETTER GROWTH Performed at [facility], [address] [place], [place] [address]    Report Status PENDING  Incomplete  MRSA Next Gen by PCR, Nasal     Status: None   Collection Time: 07/17/24  4:06 PM   Specimen: Nasal Mucosa; Nasal Swab  Result Value Ref Range Status   MRSA by PCR Next Gen NOT DETECTED NOT DETECTED Final    Comment: (NOTE) The GeneXpert MRSA Assay (FDA approved for NASAL specimens only), is one component of a comprehensive MRSA colonization surveillance program. It is not intended to diagnose MRSA infection nor to guide or monitor treatment for MRSA infections. Test performance is not FDA approved in patients less than 23 years old. Performed at [facility], [address] [place], [place] [address]   Respiratory (~20 pathogens) panel by PCR     Status: None   Collection Time: 07/17/24 11:32 PM   Specimen: Nasopharyngeal Swab; Respiratory  Result Value Ref Range Status   Adenovirus NOT DETECTED NOT DETECTED Final   Coronavirus 229E NOT DETECTED NOT DETECTED Final    Comment: (NOTE) The Coronavirus on the Respiratory Panel, DOES NOT test for the novel  Coronavirus (2019 nCoV)    Coronavirus HKU1 NOT DETECTED NOT DETECTED Final   Coronavirus NL63 NOT DETECTED NOT DETECTED Final   Coronavirus OC43 NOT DETECTED NOT DETECTED Final   Metapneumovirus NOT DETECTED NOT DETECTED Final   Rhinovirus / Enterovirus NOT DETECTED NOT DETECTED Final   Influenza A NOT DETECTED NOT DETECTED Final   Influenza B NOT DETECTED NOT DETECTED Final   Parainfluenza Virus 1 NOT DETECTED NOT DETECTED Final   Parainfluenza Virus 2 NOT DETECTED NOT DETECTED Final   Parainfluenza Virus 3 NOT DETECTED NOT DETECTED Final   Parainfluenza Virus 4 NOT DETECTED NOT DETECTED Final   Respiratory Syncytial Virus NOT DETECTED NOT DETECTED Final   Bordetella pertussis NOT DETECTED NOT DETECTED Final   Bordetella Parapertussis NOT DETECTED NOT DETECTED Final   Chlamydophila pneumoniae NOT DETECTED NOT DETECTED Final   Mycoplasma pneumoniae NOT DETECTED NOT DETECTED Final    Comment: Performed at [facility], [address] [place], [place] [address]  C Difficile Quick Screen w PCR reflex     Status: Abnormal   Collection Time: 07/18/24  7:27 PM   Specimen: STOOL  Result Value Ref Range Status   C Diff antigen POSITIVE (A) NEGATIVE Final   C Diff toxin NEGATIVE NEGATIVE Final   C Diff interpretation Results are indeterminate. See PCR results.  Final    Comment: Performed at [facility], [address] [place], [place] [address]  C. Diff by PCR, Reflexed     Status: None   Collection Time: 07/18/24  7:27 PM  Result Value Ref Range Status   Toxigenic C. Difficile  by PCR NEGATIVE NEGATIVE  Final    Comment: Patient is colonized with non toxigenic C. difficile. May not need treatment unless significant symptoms are present.   Hypervirulent Strain PRESUMPTIVE NEGATIVE PRESUMPTIVE NEGATIVE Final    Comment: Performed at [facility], [address] [place], [place] [address]     Radiology Studies: DG CHEST PORT 1 VIEW Result Date: 07/18/2024 CLINICAL DATA:  Sepsis EXAM: PORTABLE CHEST 1 VIEW COMPARISON:  07/17/2024 FINDINGS: Cardiomegaly, vascular congestion. Aortic atherosclerosis. No confluent airspace opacities, effusions or edema. No acute bony abnormality. IMPRESSION: Cardiomegaly, vascular congestion. Electronically Signed   By: Franky Crease M.D.   On: 07/18/2024 20:51    Scheduled Meds:  Chlorhexidine  Gluconate Cloth  6 each Topical Q0600   heparin  5,000 Units Subcutaneous Q8H   liver oil-zinc oxide   Topical BID   magic mouthwash  10 mL Oral QID   pantoprazole  (PROTONIX ) IV  40 mg Intravenous Q12H   Continuous Infusions:  dextrose  5 % and 0.45 % NaCl 50 mL/hr at 07/20/24 0824   fluconazole (DIFLUCAN) IV 200 mg (07/19/24 2349)   meropenem (MERREM) IV 1 g (07/20/24 0941)   potassium PHOSPHATE IVPB (in mmol) 30 mmol (07/20/24 0947)     LOS: 3 days   Time spent: 58 mins  Dalisha Shively Vicci, MD How to contact the [facility] Attending or Consulting provider 7A - 7P or covering provider during after hours 7P -7A, for this patient?  Check the care team in [facility] and look for a) attending/consulting TRH provider listed and b) the TRH team listed Log into [email or web address] to find provider on call.  Locate the TRH provider you are looking for under [facility] and page to a number that you can be directly reached. If you still have difficulty reaching the provider, please page the [facility] (Director on Call) for the Hospitalists listed on amion for assistance.  07/20/2024, 12:42 PM

## 2024-07-20 NOTE — Evaluation (Addendum)
 Clinical/Bedside Swallow Evaluation Patient Details  Name: Alejandra Cruz MRN: [id number] Date of Birth: October 06, 1933  Today's Date: 07/20/2024 Time: SLP Start Time (ACUTE ONLY): 1506 SLP Stop Time (ACUTE ONLY): 1531 SLP Time Calculation (min) (ACUTE ONLY): 25 min  Past Medical History:  Past Medical History:  Diagnosis Date   Diverticulosis OCT 2010 TCS   SIGMOID COLON   Esophageal web 10/08/2008   DILATION 16 MM   Gastritis, Helicobacter pylori 10/08/2008   Rx. ABO for 10 days    GERD (gastroesophageal reflux disease)    Hip fracture, left (HCC)    2016   Hyperlipidemia    Hypertension    Restless legs syndrome    Past Surgical History:  Past Surgical History:  Procedure Laterality Date   ABDOMINAL HYSTERECTOMY     COLONOSCOPY  OCT 2010   TORTUOUS, Sml IH, Tics, MULTIPLE SIMPLE ADENOMAS (<6MM)   HIP ARTHROPLASTY Left 11/19/2014   Procedure: LEFT PARTIAL HIP REPLACEMENT;  Surgeon: Taft FORBES Minerva, MD;  Location: [facility];  Service: Orthopedics;  Laterality: Left;   ORIF HIP FRACTURE  04/05/2012   Procedure: OPEN REDUCTION INTERNAL FIXATION HIP;  Surgeon: Taft FORBES Minerva, MD;  Location: [facility];  Service: Orthopedics;  Laterality: Right;   UPPER GASTROINTESTINAL ENDOSCOPY  AUG 2010   SAVARY   VESICOVAGINAL FISTULA CLOSURE W/ TAH  1966   bleeding    HPI:  89 y.o. female with medical history significant of past medical history significant for dementia, hypertension, hyperlipidemia, restless leg syndrome, diverticulosis who is resident of skilled nursing facility presented to the emergency department for evaluation of altered mental status, fever, tachycardia, tachypnea.  Patient has large decubitus ulcer which seems to be the source of sepsis.  Initial blood pressure is low, patient's lactic acid is 4.7, white count 26, she has AKI.  Patient will need further management evaluation of septic shock.      Patient was started on IV vancomycin , cefepime with multiple IV fluid boluses  to maintain blood pressure.  Patient had high-grade fever, last night antibiotics changed to vancomycin  and meropenem.  Started on Solu-Medrol  therapy. 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 BSE requested.    Assessment / Plan / Recommendation  Clinical Impression  Clinical swallow evaluation completed at bedside and Pt assessed with ice chips, thin water via spoon/cup/straw, NTL via straw, puree teaspoons. Pt presents with primarily cognitive based dysphagia in setting of altered mental status and intermittent engagement/alertness to task. Pt was initially alert and verbally responded to SLP and stated that he was thirsty, however she very quickly became lethargic after just a few minute of PO trials. She is  edentulous and oral cavity is dry. Pt required cuing to close her lips around the cup and straw, but once she accepted the bolus, swallow trigger appeared timely. No overt coughing or throat clearing observed, however Pt did exhibit some wheezing post swallow of thins and belching after liquids. Pt accepted a few bites of puree, but then became lethargic despite verbal and tactile cues from SLP. Pt is at risk for aspiration and not meeting nutritional needs, however risks can be minimized by offering PO frequently throughout the day and when she is alert and upright. Consider comfort feeds of D1/puree and NTL  and ok for ice chips between meals for comfort and can consider liberalizing to thin liquids. SLP will continue to follow in acute setting for diet tolerance. Pt will need 1:1 feeder assist and only offer PO when she is alert and engaged with task, continue oral care. PO medication whole in puree. Above to RN. SLP Visit Diagnosis:  Dysphagia, unspecified (R13.10)    Aspiration Risk  Risk for inadequate nutrition/hydration;Mild aspiration risk    Diet Recommendation Dysphagia 1 (Puree);Nectar-thick liquid;Ice chips PRN after oral care    Liquid Administration via: Cup;Straw;Spoon Medication Administration: Whole meds with puree Supervision: Staff to assist with self feeding;Full supervision/cueing for compensatory strategies Compensations: Slow rate;Small sips/bites Postural Changes: Seated upright at 90 degrees;Remain upright for at least 30 minutes after po intake    Other  Recommendations Oral Care Recommendations: Oral care BID;Staff/trained caregiver to provide oral care     Assistance Recommended at Discharge    Functional Status Assessment Patient has had a recent decline in their functional status and demonstrates the ability to make significant improvements in function in a reasonable and predictable amount of time.  Frequency and Duration min 2x/week  1 week       Prognosis Prognosis for improved oropharyngeal function: Fair Barriers to Reach Goals: Cognitive deficits (lethargy)      Swallow Study   General Date of Onset: 07/17/24 HPI: 89 y.o. female with medical history significant of past medical history significant for dementia, hypertension, hyperlipidemia, restless leg syndrome, diverticulosis who is resident of skilled nursing facility presented to the emergency department for evaluation of altered mental status, fever, tachycardia, tachypnea.  Patient has large decubitus ulcer which seems to be the source of sepsis.  Initial blood pressure is low, patient's lactic acid is 4.7, white count 26, she has AKI.  Patient will need further management evaluation of septic shock.      Patient was started on IV vancomycin , cefepime with multiple IV fluid boluses to maintain blood pressure.  Patient had high-grade fever, last night antibiotics changed to vancomycin  and meropenem.  Started on Solu-Medrol   therapy. 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 BSE requested. Type of Study: Bedside Swallow Evaluation Previous Swallow Assessment: N/A Diet Prior to this Study: NPO Temperature Spikes Noted: No Respiratory Status: Room air History of Recent Intubation: No Behavior/Cognition: Alert;Requires cueing;Lethargic/Drowsy;Cooperative Oral Cavity Assessment: Within Functional Limits Oral Care Completed by SLP: Yes Oral Cavity - Dentition: Edentulous Vision: Impaired for self-feeding Self-Feeding Abilities: Total assist Patient Positioning: Upright in bed Baseline Vocal Quality: Normal Volitional Cough: Strong Volitional Swallow: Able to elicit    Oral/Motor/Sensory Function Overall Oral Motor/Sensory Function: Within functional limits   Ice Chips Ice chips: Within functional limits Presentation: Spoon   Thin Liquid Thin Liquid: Impaired Presentation: Cup;Spoon;Straw Pharyngeal  Phase Impairments: Other (comments) (no overt coughing or throat clearing, but some wheezing observed)    Nectar Thick Nectar Thick Liquid: Within functional limits Presentation: Straw;Spoon   Honey Thick Honey Thick Liquid: Not tested   Puree Puree: Within functional limits Presentation: Spoon   Solid     Solid: Not tested     Thank you,  Lamar Candy, CCC-SLP [phone]  Nailea Whitehorn 07/20/2024,3:57 PM

## 2024-07-20 NOTE — Plan of Care (Signed)
  Problem: Fluid Volume: Goal: Hemodynamic stability will improve Outcome: Progressing   Problem: Respiratory: Goal: Ability to maintain adequate ventilation will improve Outcome: Progressing   Problem: Pain Managment: Goal: General experience of comfort will improve and/or be controlled Outcome: Progressing

## 2024-07-21 ENCOUNTER — Inpatient Hospital Stay (HOSPITAL_COMMUNITY)

## 2024-07-21 DIAGNOSIS — Z789 Other specified health status: Secondary | ICD-10-CM | POA: Diagnosis not present

## 2024-07-21 DIAGNOSIS — A419 Sepsis, unspecified organism: Secondary | ICD-10-CM | POA: Diagnosis not present

## 2024-07-21 DIAGNOSIS — N179 Acute kidney failure, unspecified: Secondary | ICD-10-CM | POA: Diagnosis not present

## 2024-07-21 DIAGNOSIS — L89104 Pressure ulcer of unspecified part of back, stage 4: Secondary | ICD-10-CM | POA: Diagnosis not present

## 2024-07-21 DIAGNOSIS — Z558 Other problems related to education and literacy: Secondary | ICD-10-CM | POA: Diagnosis not present

## 2024-07-21 DIAGNOSIS — Z7189 Other specified counseling: Secondary | ICD-10-CM | POA: Diagnosis not present

## 2024-07-21 DIAGNOSIS — G9341 Metabolic encephalopathy: Secondary | ICD-10-CM | POA: Diagnosis not present

## 2024-07-21 DIAGNOSIS — Z515 Encounter for palliative care: Secondary | ICD-10-CM | POA: Diagnosis not present

## 2024-07-21 LAB — CBC WITH DIFFERENTIAL/PLATELET
Abs Immature Granulocytes: 0.7 K/uL — ABNORMAL HIGH (ref 0.00–0.07)
Band Neutrophils: 1 %
Basophils Absolute: 0 K/uL (ref 0.0–0.1)
Basophils Relative: 0 %
Eosinophils Absolute: 0.2 K/uL (ref 0.0–0.5)
Eosinophils Relative: 1 %
HCT: 32.9 % — ABNORMAL LOW (ref 36.0–46.0)
Hemoglobin: 10.7 g/dL — ABNORMAL LOW (ref 12.0–15.0)
Lymphocytes Relative: 8 %
Lymphs Abs: 1.9 K/uL (ref 0.7–4.0)
MCH: 30.3 pg (ref 26.0–34.0)
MCHC: 32.5 g/dL (ref 30.0–36.0)
MCV: 93.2 fL (ref 80.0–100.0)
Metamyelocytes Relative: 2 %
Monocytes Absolute: 0 K/uL — ABNORMAL LOW (ref 0.1–1.0)
Monocytes Relative: 0 %
Myelocytes: 1 %
Neutro Abs: 21.4 K/uL — ABNORMAL HIGH (ref 1.7–7.7)
Neutrophils Relative %: 87 %
Platelets: 103 K/uL — ABNORMAL LOW (ref 150–400)
RBC: 3.53 MIL/uL — ABNORMAL LOW (ref 3.87–5.11)
RDW: 14.6 % (ref 11.5–15.5)
WBC: 24.3 K/uL — ABNORMAL HIGH (ref 4.0–10.5)
nRBC: 0.1 % (ref 0.0–0.2)

## 2024-07-21 LAB — COMPREHENSIVE METABOLIC PANEL WITH GFR
ALT: 50 U/L — ABNORMAL HIGH (ref 0–44)
AST: 28 U/L (ref 15–41)
Albumin: 3 g/dL — ABNORMAL LOW (ref 3.5–5.0)
Alkaline Phosphatase: 182 U/L — ABNORMAL HIGH (ref 38–126)
Anion gap: 10 (ref 5–15)
BUN: 16 mg/dL (ref 8–23)
CO2: 32 mmol/L (ref 22–32)
Calcium: 7.8 mg/dL — ABNORMAL LOW (ref 8.9–10.3)
Chloride: 106 mmol/L (ref 98–111)
Creatinine, Ser: 0.56 mg/dL (ref 0.44–1.00)
GFR, Estimated: >60 mL/min (ref >60)
Glucose, Bld: 106 mg/dL — ABNORMAL HIGH (ref 70–99)
Potassium: 3.6 mmol/L (ref 3.5–5.1)
Sodium: 148 mmol/L — ABNORMAL HIGH (ref 135–145)
Total Bilirubin: 0.4 mg/dL (ref 0.0–1.2)
Total Protein: 6 g/dL — ABNORMAL LOW (ref 6.5–8.1)

## 2024-07-21 LAB — GLUCOSE, CAPILLARY
Glucose-Capillary: 116 mg/dL — ABNORMAL HIGH (ref 70–99)
Glucose-Capillary: 110 mg/dL — ABNORMAL HIGH (ref 70–99)
Glucose-Capillary: 127 mg/dL — ABNORMAL HIGH (ref 70–99)
Glucose-Capillary: 116 mg/dL — ABNORMAL HIGH (ref 70–99)
Glucose-Capillary: 128 mg/dL — ABNORMAL HIGH (ref 70–99)

## 2024-07-21 LAB — CULTURE, BLOOD (ROUTINE X 2)

## 2024-07-21 LAB — PHOSPHORUS: Phosphorus: 1.8 mg/dL — ABNORMAL LOW (ref 2.5–4.6)

## 2024-07-21 MED ORDER — FUROSEMIDE 10 MG/ML IJ SOLN
20.0000 mg | Freq: Once | INTRAMUSCULAR | Status: AC
  Administered 2024-07-21: 20 mg via INTRAVENOUS
  Filled 2024-07-21: qty 2

## 2024-07-21 MED ORDER — IPRATROPIUM-ALBUTEROL 0.5-2.5 (3) MG/3ML IN SOLN
3.0000 mL | Freq: Three times a day (TID) | RESPIRATORY_TRACT | Status: DC
  Administered 2024-07-22: 3 mL via RESPIRATORY_TRACT
  Filled 2024-07-21: qty 3

## 2024-07-21 MED ORDER — DEXTROSE 5 % IV SOLN: INTRAVENOUS | Status: AC

## 2024-07-21 MED ORDER — POTASSIUM PHOSPHATES 15 MMOLE/5ML IV SOLN
15.0000 mmol | Freq: Once | INTRAVENOUS | Status: AC
  Administered 2024-07-21: 15 mmol via INTRAVENOUS
  Filled 2024-07-21: qty 5

## 2024-07-21 NOTE — Progress Notes (Signed)
 PROGRESS NOTE   Alejandra Cruz  FMW:[id number] DOB: 1932/12/25 DOA: 07/17/2024 PCP: Landy Barnie RAMAN, NP   Chief Complaint  Patient presents with   Altered Mental Status   Level of care: Telemetry  Brief Admission History:  89 y.o. female with medical history significant of past medical history significant for dementia, hypertension, hyperlipidemia, restless leg syndrome, diverticulosis who is resident of skilled nursing facility presented to the emergency department for evaluation of altered mental status, fever, tachycardia, tachypnea.  Patient has large decubitus ulcer which seems to be the source of sepsis.  Initial blood pressure is low, patient's lactic acid is 4.7, white count 26, she has AKI.  Patient will need further management evaluation of septic shock.    Patient was started on IV vancomycin , cefepime with multiple IV fluid boluses to maintain blood pressure.  Patient had high-grade fever, last night antibiotics changed to vancomycin  and meropenem.  Started on Solu-Medrol  therapy.   Assessment and Plan:  Septic shock with multiorgan dysfunction Infected large decubitus ulcer  Proteus bacteremia Presented with fever, tachycardia, tachypnea, low BP, lactic acid 4.7, white count 26, AKI, altered mental status, respiratory failure.   Sepsis physiology resolving and she is now off pressors  Blood cultures grew Proteus, follow sensitivities. Wound care ordered dressing changes, air loss mattress. 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   Acute  metabolic encephalopathy -- slowly improving In the setting of sepsis.  She does have baseline dementia. Re-introducing diet in coordination with SLP therapist Continue supportive care. Delirium precautions.  Hypernatremia --ongoing poor oral intake, adding more free water to IV fluid --ongoing goals of care discussions with palliative and hospitalist --monitor sodium daily    Acute hypoxic respiratory failure Presented tachycardic, tachypneic, hypoxic. Patient is currently on 3 L supplemental oxygen to manage oxygen greater than 92%.  Continue to wean her oxygen.  Requested wean to 2L/min oxygen today.    AKI -- RESOLVED  Hyperkalemia-RESOLVED Improved after IV fluids   Hypertension: Hold of blood pressures in the setting of septic shock, altered mental status.   Hyperlipidemia: Could consider resumption of statin once she is more alert, awake.   Advanced dementia: Continue delirium precautions. Supportive care. Patient is DNR. Palliative consult for further goals of care  Family had expressed that if no meaningful improvement by 10/13, transition to full comfort measures, goals of care discussion with family via palliative medicine consult today   Hypophosphatemia -- IV replacement ordered -- recheck in AM  -- additional replacement ordered 10/14 -- watch for refeeding syndrome - daily Mg, phos   Dysphagia -- MBS planned by SLP therapist -- Dysphagia diet per SLP  DVT prophylaxis: sq heparin Code Status: DNRDNI  Family Communication: daughter at bedside updated 10/13  Disposition: TBD   Consultants:  Palliative care  Procedures:   Antimicrobials:    Subjective: Pt able to answer some questions and says she is hungry.  She did not express any particular complaint.   Objective: Vitals:   07/21/24 0429 07/21/24 0807 07/21/24 1355 07/21/24 1431  BP: (!) 145/78  122/64  Pulse: 80  73   Resp: 20  20   Temp: 99.7 F (37.6 C)  99.4 F (37.4 C)   TempSrc:  Axillary  Oral   SpO2: 100% 100% 100% 99%  Weight:      Height:        Intake/Output Summary (Last 24 hours) at 07/21/2024 1743 Last data filed at 07/21/2024 1625 Gross per 24 hour  Intake 1136.77 ml  Output 3700 ml  Net -2563.23 ml   Filed Weights   07/17/24 1138 07/17/24 1604  Weight: 94 kg 94 kg   Examination:  General exam: appears acutely and chronically ill.  Appears distressed.  Nonverbal and somnolent.  Clearly not able to make needs known.   Respiratory system: shallow breaths but no increased WOB seen, mouth breathing only. Cardiovascular system: normal S1 & S2 heard.  Gastrointestinal system: Abdomen is soft. No organomegaly or masses felt. Normal bowel sounds heard. Central nervous system: somnolent. No focal neurological deficits. Extremities: no gross abnormalities. Skin: stage 4 coccyx wound  Psychiatry: Judgement and insight unable to determine due to condition.  Wound 07/17/24 1602 Pressure Injury Coccyx Stage 4 - Full thickness tissue loss with exposed bone, tendon or muscle. (Active)   Data Reviewed: I have personally reviewed following labs and imaging studies  CBC: Recent Labs  Lab 07/17/24 1154 07/17/24 2335 07/18/24 0035 07/19/24 0317 07/20/24 0421 07/21/24 0437  WBC 26.1* 30.0* 36.4* 37.5* 33.8* 24.3*  NEUTROABS 18.0* 22.6* 27.1*  --  30.0* 21.4*  HGB 12.1 11.1* 10.8* 10.3* 10.6* 10.7*  HCT 37.6 36.1 35.0* 31.6* 32.8* 32.9*  MCV 93.8 97.3 97.5 93.5 93.4 93.2  PLT 217 155 150 118* 116* 103*    Basic Metabolic Panel: Recent Labs  Lab 07/17/24 2104 07/18/24 0301 07/19/24 0317 07/20/24 0421 07/21/24 0437  NA 143 143 144 146* 148*  K 4.3 4.4 4.1 3.6 3.6  CL 106 106 109 110 106  CO2 23 22 25 28  32  GLUCOSE 102* 129* 151* 127* 106*  BUN 45* 47* 36* 27* 16  CREATININE 2.25* 1.88* 0.74 0.81 0.56  CALCIUM  7.9* 8.0* 7.5* 7.8* 7.8*  MG  --  2.1  --  2.3  --   PHOS  --  3.9  --  1.1* 1.8*    CBG: Recent Labs  Lab 07/20/24 2354  07/21/24 0428 07/21/24 0744 07/21/24 1114 07/21/24 1623  GLUCAP 120* 116* 110* 127* 116*    Recent Results (from the past 240 hours)  Blood Culture (routine x 2)     Status: Abnormal (Preliminary result)   Collection Time: 07/17/24 11:54 AM   Specimen: BLOOD  Result Value Ref Range Status   Specimen Description   Final    BLOOD RIGHT ARM Performed at [facility], [address] [place], [place] [address]    Special Requests   Final    BOTTLES DRAWN AEROBIC AND ANAEROBIC Blood Culture adequate volume Performed at [facility], [address], [place], [place] [address]    Culture  Setup Time   Final    GRAM NEGATIVE RODS IN BOTH AEROBIC AND ANAEROBIC BOTTLES CRITICAL RESULT CALLED TO, READ BACK BY AND VERIFIED WITH: KINDLEY,C ON 07/18/24 AT 0334 BY PURDIE,J CRITICAL RESULT CALLED TO, READ BACK BY AND VERIFIED WITH: RN CATLIN ROGERS [id number] AT 0850 BY EC    Culture (A)  Final    PROTEUS MIRABILIS PROVIDENCIA STUARTII CULTURE REINCUBATED FOR BETTER GROWTH Performed at [facility], [address] [place], [place] [address]  Report Status PENDING  Incomplete   Organism ID, Bacteria PROTEUS MIRABILIS  Final      Susceptibility   Proteus mirabilis - MIC*    AMPICILLIN <=2 SENSITIVE Sensitive     CEFAZOLIN  (NON-URINE) 4 INTERMEDIATE Intermediate     CEFEPIME <=0.12 SENSITIVE Sensitive     ERTAPENEM <=0.12 SENSITIVE Sensitive     CEFTRIAXONE <=0.25 SENSITIVE Sensitive     CIPROFLOXACIN 2 RESISTANT Resistant     GENTAMICIN <=1 SENSITIVE Sensitive     MEROPENEM 1 SENSITIVE Sensitive     TRIMETH/SULFA <=20 SENSITIVE Sensitive     AMPICILLIN/SULBACTAM <=2 SENSITIVE Sensitive     PIP/TAZO Value in next row Sensitive      <=4 SENSITIVEThis is a modified FDA-approved test that has been validated and its performance characteristics determined by the reporting laboratory.  This laboratory is certified under the Clinical Laboratory Improvement Amendments CLIA as qualified to  perform high complexity clinical laboratory testing.    * PROTEUS MIRABILIS  Blood Culture ID Panel (Reflexed)     Status: Abnormal   Collection Time: 07/17/24 11:54 AM  Result Value Ref Range Status   Enterococcus faecalis NOT DETECTED NOT DETECTED Final   Enterococcus Faecium NOT DETECTED NOT DETECTED Final   Listeria monocytogenes NOT DETECTED NOT DETECTED Final   Staphylococcus species NOT DETECTED NOT DETECTED Final   Staphylococcus aureus (BCID) NOT DETECTED NOT DETECTED Final   Staphylococcus epidermidis NOT DETECTED NOT DETECTED Final   Staphylococcus lugdunensis NOT DETECTED NOT DETECTED Final   Streptococcus species NOT DETECTED NOT DETECTED Final   Streptococcus agalactiae NOT DETECTED NOT DETECTED Final   Streptococcus pneumoniae NOT DETECTED NOT DETECTED Final   Streptococcus pyogenes NOT DETECTED NOT DETECTED Final   A.calcoaceticus-baumannii NOT DETECTED NOT DETECTED Final   Bacteroides fragilis NOT DETECTED NOT DETECTED Final   Enterobacterales DETECTED (A) NOT DETECTED Final    Comment: Enterobacterales represent a large order of gram negative bacteria, not a single organism. CRITICAL RESULT CALLED TO, READ BACK BY AND VERIFIED WITH: RN EDMONIA GOSLING [id number] AT 0850 BY EC    Enterobacter cloacae complex NOT DETECTED NOT DETECTED Final   Escherichia coli NOT DETECTED NOT DETECTED Final   Klebsiella aerogenes NOT DETECTED NOT DETECTED Final   Klebsiella oxytoca NOT DETECTED NOT DETECTED Final   Klebsiella pneumoniae NOT DETECTED NOT DETECTED Final   Proteus species DETECTED (A) NOT DETECTED Final    Comment: CRITICAL RESULT CALLED TO, READ BACK BY AND VERIFIED WITH: RN CATLIN ROGERS [id number] AT 0850 BY EC    Salmonella species NOT DETECTED NOT DETECTED Final   Serratia marcescens NOT DETECTED NOT DETECTED Final   Haemophilus influenzae NOT DETECTED NOT DETECTED Final   Neisseria meningitidis NOT DETECTED NOT DETECTED Final   Pseudomonas aeruginosa NOT DETECTED NOT  DETECTED Final   Stenotrophomonas maltophilia NOT DETECTED NOT DETECTED Final   Candida albicans NOT DETECTED NOT DETECTED Final   Candida auris NOT DETECTED NOT DETECTED Final   Candida glabrata NOT DETECTED NOT DETECTED Final   Candida krusei NOT DETECTED NOT DETECTED Final   Candida parapsilosis NOT DETECTED NOT DETECTED Final   Candida tropicalis NOT DETECTED NOT DETECTED Final   Cryptococcus neoformans/gattii NOT DETECTED NOT DETECTED Final   CTX-M ESBL NOT DETECTED NOT DETECTED Final   Carbapenem resistance IMP NOT DETECTED NOT DETECTED Final   Carbapenem resistance KPC NOT DETECTED NOT DETECTED Final   Carbapenem resistance NDM NOT DETECTED NOT DETECTED Final   Carbapenem resist OXA 48 LIKE NOT DETECTED NOT  DETECTED Final   Carbapenem resistance VIM NOT DETECTED NOT DETECTED Final    Comment: Performed at [facility], [address], [place], [place] [address]  Blood Culture (routine x 2)     Status: Abnormal   Collection Time: 07/17/24 11:59 AM   Specimen: BLOOD  Result Value Ref Range Status   Specimen Description   Final    BLOOD LEFT ARM Performed at [facility], [address] [place], [place] [address]    Special Requests   Final    BOTTLES DRAWN AEROBIC AND ANAEROBIC Blood Culture results may not be optimal due to an inadequate volume of blood received in culture bottles Performed at [facility], [address], [place], [place] [address]    Culture  Setup Time   Final    GRAM NEGATIVE RODS IN BOTH AEROBIC AND ANAEROBIC BOTTLES CRITICAL RESULT CALLED TO, READ BACK BY AND VERIFIED WITH: KINDLEY,C ON 07/18/24 AT 0334 BY PURDIE,J    Culture (A)  Final    PROTEUS MIRABILIS SUSCEPTIBILITIES PERFORMED ON PREVIOUS CULTURE WITHIN THE LAST 5 DAYS. Performed at [facility], [address], [place], [place] [address]    Report Status 07/21/2024 FINAL  Final  MRSA Next Gen by PCR, Nasal     Status: None   Collection Time: 07/17/24  4:06 PM   Specimen: Nasal  Mucosa; Nasal Swab  Result Value Ref Range Status   MRSA by PCR Next Gen NOT DETECTED NOT DETECTED Final    Comment: (NOTE) The GeneXpert MRSA Assay (FDA approved for NASAL specimens only), is one component of a comprehensive MRSA colonization surveillance program. It is not intended to diagnose MRSA infection nor to guide or monitor treatment for MRSA infections. Test performance is not FDA approved in patients less than 27 years old. Performed at [facility], [address], [place], [place] [address]   Respiratory (~20 pathogens) panel by PCR     Status: None   Collection Time: 07/17/24 11:32 PM   Specimen: Nasopharyngeal Swab; Respiratory  Result Value Ref Range Status   Adenovirus NOT DETECTED NOT DETECTED Final   Coronavirus 229E NOT DETECTED NOT DETECTED Final    Comment: (NOTE) The Coronavirus on the Respiratory Panel, DOES NOT test for the novel  Coronavirus (2019 nCoV)    Coronavirus HKU1 NOT DETECTED NOT DETECTED Final   Coronavirus NL63 NOT DETECTED NOT DETECTED Final   Coronavirus OC43 NOT DETECTED NOT DETECTED Final   Metapneumovirus NOT DETECTED NOT DETECTED Final   Rhinovirus / Enterovirus NOT DETECTED NOT DETECTED Final   Influenza A NOT DETECTED NOT DETECTED Final   Influenza B NOT DETECTED NOT DETECTED Final   Parainfluenza Virus 1 NOT DETECTED NOT DETECTED Final   Parainfluenza Virus 2 NOT DETECTED NOT DETECTED Final   Parainfluenza Virus 3 NOT DETECTED NOT DETECTED Final   Parainfluenza Virus 4 NOT DETECTED NOT DETECTED Final   Respiratory Syncytial Virus NOT DETECTED NOT DETECTED Final   Bordetella pertussis NOT DETECTED NOT DETECTED Final   Bordetella Parapertussis NOT DETECTED NOT DETECTED Final   Chlamydophila pneumoniae NOT DETECTED NOT DETECTED Final   Mycoplasma pneumoniae NOT DETECTED NOT DETECTED Final    Comment: Performed at [facility], [address] [place], [place] [address]  C Difficile Quick Screen w PCR reflex     Status: Abnormal    Collection Time: 07/18/24  7:27 PM   Specimen: STOOL  Result Value Ref Range Status   C Diff antigen POSITIVE (  A) NEGATIVE Final   C Diff toxin NEGATIVE NEGATIVE Final   C Diff interpretation Results are indeterminate. See PCR results.  Final    Comment: Performed at [facility], [address] [place], [place] [address]  C. Diff by PCR, Reflexed     Status: None   Collection Time: 07/18/24  7:27 PM  Result Value Ref Range Status   Toxigenic C. Difficile by PCR NEGATIVE NEGATIVE Final    Comment: Patient is colonized with non toxigenic C. difficile. May not need treatment unless significant symptoms are present.   Hypervirulent Strain PRESUMPTIVE NEGATIVE PRESUMPTIVE NEGATIVE Final    Comment: Performed at [facility], [address] [place], [place] [address]     Radiology Studies: DG CHEST PORT 1 VIEW Result Date: 07/21/2024 CLINICAL DATA:  Abnormal lung sounds. EXAM: PORTABLE CHEST 1 VIEW COMPARISON:  07/20/2024 FINDINGS: The cardio pericardial silhouette is enlarged. There is pulmonary vascular congestion without overt pulmonary edema. Diffuse interstitial opacity suggests edema. Retrocardiac atelectasis or infiltrate with small left pleural effusion, similar to prior. Bones are diffusely demineralized. Degenerative changes noted in both shoulders. Telemetry leads overlie the chest. IMPRESSION: 1. Enlargement of the cardiopericardial silhouette with pulmonary vascular congestion and diffuse interstitial opacity suggesting edema. 2. Retrocardiac atelectasis or infiltrate with small left pleural effusion, similar to prior. Electronically Signed   By: Camellia Candle M.D.   On: 07/21/2024 05:43   DG CHEST PORT 1 VIEW Result Date: 07/20/2024 EXAM: 1 VIEW(S) XRAY OF THE CHEST 07/20/2024 03:22:00 PM COMPARISON: Chest x-ray 07/18/2024. CLINICAL HISTORY: Abnormal lung sounds. Per chart: medical history significant of past medical history significant for dementia, hypertension,  hyperlipidemia, restless leg syndrome, diverticulosis who is resident of skilled nursing facility presented to the emergency department for evaluation of altered mental status, fever, tachycardia, tachypnea. FINDINGS: LUNGS AND PLEURA: Persistent retrocardiac airspace opacity limited evaluation due to overlying lines. Chronic coarsened interstitial markings. No pulmonary edema. No pleural effusion. No pneumothorax. HEART AND MEDIASTINUM: Atherosclerotic plaque. No acute abnormality of the cardiac and mediastinal silhouettes. BONES AND SOFT TISSUES: Severe degenerative changes of the left shoulder. No acute osseous abnormality. IMPRESSION: 1. Persistent retrocardiac airspace opacity, limited evaluation due to overlying lines. Recommend further evaluation with chest x-ray PA and lateral view 2. Atherosclerotic vascular calcifications. Electronically signed by: Morgane Naveau MD 07/20/2024 08:28 PM EDT RP Workstation: [id number]    Scheduled Meds:  Chlorhexidine  Gluconate Cloth  6 each Topical Q0600   heparin  5,000 Units Subcutaneous Q8H   ipratropium-albuterol  3 mL Nebulization Q6H   liver oil-zinc oxide   Topical BID   magic mouthwash  10 mL Oral QID   pantoprazole  (PROTONIX ) IV  40 mg Intravenous Q12H   Continuous Infusions:  cefTRIAXone (ROCEPHIN)  IV Stopped (07/21/24 1510)   dextrose  40 mL/hr at 07/21/24 1624   fluconazole (DIFLUCAN) IV 200 mg (07/20/24 2319)   metronidazole 500 mg (07/21/24 1729)     LOS: 4 days   Time spent: 55 mins  Nashid Pellum Vicci, MD How to contact the [facility] Attending or Consulting provider 7A - 7P or covering provider during after hours 7P -7A, for this patient?  Check the care team in [facility] and look for a) attending/consulting [facility] provider listed and b) the TR[facility] team listed Log into [email or web address] to find provider on call.  Locate the [facility] provider you are looking for under [facility] and page to a number that you can be directly reached. If you still have  difficulty reaching the provider,  please page the [facility] (Director on Call) for the Hospitalists listed on amion for assistance.  07/21/2024, 5:43 PM

## 2024-07-21 NOTE — Progress Notes (Signed)
 Daily Progress Note   Patient Name: Alejandra Cruz       Date: 07/21/2024 DOB: 1933-05-15  Age: 89 y.o. MRN#: [id number] Attending Physician: Vicci Afton CROME, MD Primary Care Physician: Landy Barnie RAMAN, NP Admit Date: 07/17/2024  Reason for Consultation/Follow-up: Disposition and Establishing goals of care  Subjective:   89 y.o. female  with past medical history of dementia, hypertension, hyperlipidemia, restless leg syndrome, diverticulosis admitted on 07/17/2024 with altered mental status.    Workup revealed findings concerning for septic shock with multiorgan dysfunction septic shock with multiorgan dysfunction secondary to infected large decubitus ulcer with Proteus bacteremia.  Initially required vasopressor support.  Also requiring supplemental O2.  Initiated on broad-spectrum antibiotics.  Initial completed 07/20/2024: Goals of care/advance care planning discussion.  Goals are to focus on comfort and quality of life.  DNR/DNI status confirmed.  Continue to treat the treatable and allow time for outcomes.  SLP consult to evaluate swallowing ability.  Today, labs independently reviewed.  Continues to be hypernatremic with a sodium of 148 compared to 146 yesterday.  Would likely benefit from continued IV fluids.  Renal function stable.  Continues to have some hypocalcemia in the setting of Hypoalbuminemia.  Calcium  corrects to 8.6 mg/dL. Low albumin levels are associated with greater disease burden and poorer long-term prognosis.  ALP and ALT continue to be elevated but improving compared to previous.  CBC reviewed.  White blood cell count trending down (24.3 today<<33.8<<37.5<<30<<26.1 on 10/10).  Would recommend continuing current antibiotic therapy and monitoring.  Hemoglobin remains in the 10 g/dL range.  Platelet count low at 103.   No bleeding noted.  Would monitor.  Chest x-ray completed 07/21/2024 independently reviewed.  Heart appears enlarged.  Continues to have some congestion in the pulmonary vasculature.  Vital signs reviewed.  Continues to require O2 at 2 L via nasal cannula to maintain O2 sats.  Heart rate stable.  Blood pressure stable.  Slight low-grade fever noted.  Medication administration record reviewed.  Total of 650 mg of Tylenol  given on 24-hour look back.  Otherwise, no as needed symptom meds administered.  SLP notes reviewed.  Patient able to be placed on dysphagia 1 pured diet with nectar thick liquids.  Full supervision needed.  Independent history obtained from family and nursing staff as patient is a poor historian.  Per nursing, patient is much more alert today than she was yesterday.  However, she continues to have nominal p.o. intake.  Nursing staff states that they attempted to feed her but she just held the food in her mouth.  Speak with patient's son at bedside.  Discussed findings of SLP that she is able to tolerate and altered diet by mouth.  However, discussed findings of holding the food in her mouth.  We discussed goals of care for the patient.  He states that while it is hard for him he knows that she is in the final stages of her dementia.  He feels the most important thing at this point is for her to be comfortable and have good quality of life.  We discussed given goals of care comfort and quality of life that she would be appropriate for hospice. Reviewed hospice philosophy of care, including focus on comfort, quality of life, and support for patients and families facing terminal illness. Discussed eligibility criteria and potential benefits of enrollment. Discussed multiple hospice care settings, including home-based hospice, long-term care facilities with hospice services, and inpatient hospice units. Reviewed the nature of care delivery in each setting, admission criteria, and associated  financial considerations.  Patient's son states that he has talked to his sister about this and that they do not want her to undergo any more suffering and feel that hospice would help ensure her goals and wishes are honored.  They are interested in hospice referral at discharge.  Either interested in hospice in the inpatient setting if she qualifies versus back to long-term care facility with hospice support.  Desire additional time for outcomes on current antibiotic regimen and ongoing speech therapy to follow to optimize her status before discharge to ultimate care setting.  Chart review/care coordination:  Completed extensive chart review including EPIC notes, labs and imaging (independently reviewed), MAR, and vital signs. Coordinated care with attending physician and bedside nursing staff.  Also spoke with patient's son at bedside.  Attempted to update patient's daughter via phone however, no answer, left voicemail requesting return call.   Length of Stay: 4   Physical Exam Constitutional:      General: She is not in acute distress.    Comments: Chronic ill appearing  Pulmonary:     Effort: Pulmonary effort is normal. No respiratory distress.  Skin:    General: Skin is warm and dry.  Neurological:     Mental Status: She is alert.             Vital Signs: BP 122/64 (BP Location: Right Arm)   Pulse 73   Temp 99.4 F (37.4 C) (Oral)   Resp 20   Ht 5' 8 (1.727 m)   Wt 94 kg   SpO2 99%   BMI 31.51 kg/m  SpO2: SpO2: 99 % O2 Device: O2 Device: Nasal Cannula O2 Flow Rate: O2 Flow Rate (L/min): 2 L/min      Palliative Assessment/Data: 20%   Palliative Care Assessment & Plan   Patient Profile/Assessment:  89 year old female admitted for altered mental status diagnosed with septic shock secondary to large infected decubitus ulcer.  Initial completed on 07/20/2024.  DNR/DNI status confirmed.  Goals of care comfort/quality of life.  Plan is to allow time for outcomes and assess  patient response.  If no to minimal improvement with treatment then transition to comfort care.  SLP consult for swallow evaluation.  At discharge, disposition support needs have been reviewed and are being considered.  Likely will enroll in hospice.  On follow-up on 07/21/2024, patient has some improved mental alertness.  However, she continues to have poor p.o. intake.  Would like to continue current antibiotic therapy as it appears effective.  If p.o. intake continues to be poor then family is considering  inpatient hospice.  If p.o. intake improves then may be back to skilled nursing facility with hospice support.  Family is hoping to ensure optimal comfort and quality of life at discharge.   Recommendations/Plan:  DNR/DNI Time for outcomes SLP continue to treat Depending on response to therapy consider inpatient hospice versus back to SNF with hospice Palliative medicine team will continue to follow for ongoing goals of care discussion, symptom management, and coordination of care.    Symptom management:  Symptoms stable at present, therefore continue symptom regimen per admitting team with PMT available as needed for support   Prognosis: Guarded    Discharge Planning: To Be Determined    Detailed review of medical records (labs, imaging, vital signs), medically appropriate exam, discussed with treatment team, counseling and education to patient, family, & staff, documenting clinical information, medication management, coordination of care    Billing based on MDM: High  Problems Addressed: One acute or chronic illness or injury that poses a threat to life or bodily function  Amount and/or Complexity of Data: Category 1:Assessment requiring an independent historian(s) and Category 3:Discussion of management or test interpretation with external physician/other qualified health care professional/appropriate source (not separately reported)  Risks: n/a         Laymon CHRISTELLA Pinal, NP  Palliative Medicine Team Team phone # [phone]  Thank you for allowing the Palliative Medicine Team to assist in the care of this patient. Please utilize secure chat with additional questions, if there is no response within 30 minutes please call the above phone number.  Palliative Medicine Team providers are available by phone from 7am to 7pm daily and can be reached through the team cell phone.  Should this patient require assistance outside of these hours, please call the patient's attending physician.

## 2024-07-21 NOTE — Progress Notes (Signed)
 Speech Language Pathology Treatment: Dysphagia  Patient Details Name: Alejandra Cruz MRN: [id number] DOB: July 30, 1933 Today's Date: 07/21/2024 Time: 8979-8954 SLP Time Calculation (min) (ACUTE ONLY): 25 min  Assessment / Plan / Recommendation Clinical Impression  Pt seen for dysphagia f/u tx session with prescribed diet of Dysphagia 1(puree)/nectar-thickened liquids with FULL supervision and mod verbal/tactile cues to initiate swallow d/t oral retention/holding with all consistencies. Pt with cognitive-based dysphagia c/b oral holding, reported pocketing in buccal cavity intermittently by family/staff and delay in the initiation of the swallow.  Pt exhibited expiratory wheezing post swallows of nectar-thickened liquids via straw sips d/t potential laryngeal intrusion.  Pt unable to achieve a cough response volitionally, but she did clear throat when attempted, but this was noted to be weak.  Recommend continue current diet with above mentioned precautions and objective assessment to determine safest liquids d/t overt s/s of aspiration noted during intake this session as pt able.  This could be completed inpatient and/or as an outpatient prn if symptoms persist.  Discussed with family/pt and they are in agreement with plan.  ST will continue to f/u in acute setting.    HPI HPI: 89 y.o. female with medical history significant of past medical history significant for dementia, hypertension, hyperlipidemia, restless leg syndrome, diverticulosis who is resident of skilled nursing facility presented to the emergency department for evaluation of altered mental status, fever, tachycardia, tachypnea. Patient has large decubitus ulcer which seems to be the source of sepsis. Initial blood pressure is low, patient's lactic acid is 4.7, white count 26, she has AKI. Patient will need further management evaluation of septic shock. Patient was started on IV vancomycin , cefepime with multiple IV fluid boluses to maintain  blood pressure. Patient had high-grade fever, last night antibiotics changed to vancomycin  and meropenem. Started on Solu-Medrol  therapy. 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 BSE completed and pt placed on Dysphagia 1/nectar-thickened liquids.  ST f/u for dysphagia tx.      SLP Plan  MBS          Recommendations  Diet recommendations: Dysphagia 1 (puree);Nectar-thick liquid Liquids provided via: Teaspoon;No straw Medication Administration: Crushed with puree Supervision: Full supervision/cueing for compensatory strategies;Trained caregiver to feed patient Compensations: Slow rate;Small sips/bites;Minimize environmental distractions;Other (Comment) (watch for swallow prior to next bite/sip; eat/drink when alert only) Postural Changes and/or Swallow Maneuvers: Seated upright 90 degrees                  Oral care BID;Staff/trained caregiver to provide oral care   Frequent or constant Supervision/Assistance Dysphagia, oropharyngeal phase (R13.12)     MBS     Pat Alhaji Mcneal,M.S.,CCC-SLP  07/21/2024, 10:54 AM

## 2024-07-21 NOTE — Progress Notes (Signed)
 This encounter was created in error - please disregard.

## 2024-07-22 DIAGNOSIS — Z7189 Other specified counseling: Secondary | ICD-10-CM | POA: Diagnosis not present

## 2024-07-22 DIAGNOSIS — A419 Sepsis, unspecified organism: Secondary | ICD-10-CM | POA: Diagnosis not present

## 2024-07-22 DIAGNOSIS — Z558 Other problems related to education and literacy: Secondary | ICD-10-CM | POA: Diagnosis not present

## 2024-07-22 DIAGNOSIS — Z515 Encounter for palliative care: Secondary | ICD-10-CM | POA: Diagnosis not present

## 2024-07-22 DIAGNOSIS — R6521 Severe sepsis with septic shock: Secondary | ICD-10-CM | POA: Diagnosis not present

## 2024-07-22 LAB — CBC WITH DIFFERENTIAL/PLATELET
Abs Immature Granulocytes: 0.79 K/uL — ABNORMAL HIGH (ref 0.00–0.07)
Basophils Absolute: 0.1 K/uL (ref 0.0–0.1)
Basophils Relative: 1 %
Eosinophils Absolute: 0.2 K/uL (ref 0.0–0.5)
Eosinophils Relative: 1 %
HCT: 34 % — ABNORMAL LOW (ref 36.0–46.0)
Hemoglobin: 11.1 g/dL — ABNORMAL LOW (ref 12.0–15.0)
Immature Granulocytes: 4 %
Lymphocytes Relative: 10 %
Lymphs Abs: 2 K/uL (ref 0.7–4.0)
MCH: 30.1 pg (ref 26.0–34.0)
MCHC: 32.6 g/dL (ref 30.0–36.0)
MCV: 92.1 fL (ref 80.0–100.0)
Monocytes Absolute: 2 K/uL — ABNORMAL HIGH (ref 0.1–1.0)
Monocytes Relative: 10 %
Neutro Abs: 14.8 K/uL — ABNORMAL HIGH (ref 1.7–7.7)
Neutrophils Relative %: 74 %
Platelets: 123 K/uL — ABNORMAL LOW (ref 150–400)
RBC: 3.69 MIL/uL — ABNORMAL LOW (ref 3.87–5.11)
RDW: 14.3 % (ref 11.5–15.5)
WBC: 19.9 K/uL — ABNORMAL HIGH (ref 4.0–10.5)
nRBC: 0.1 % (ref 0.0–0.2)

## 2024-07-22 LAB — MAGNESIUM: Magnesium: 1.9 mg/dL (ref 1.7–2.4)

## 2024-07-22 LAB — COMPREHENSIVE METABOLIC PANEL WITH GFR
ALT: 36 U/L (ref 0–44)
AST: 19 U/L (ref 15–41)
Albumin: 2.9 g/dL — ABNORMAL LOW (ref 3.5–5.0)
Alkaline Phosphatase: 130 U/L — ABNORMAL HIGH (ref 38–126)
Anion gap: 6 (ref 5–15)
BUN: 11 mg/dL (ref 8–23)
CO2: 35 mmol/L — ABNORMAL HIGH (ref 22–32)
Calcium: 7.8 mg/dL — ABNORMAL LOW (ref 8.9–10.3)
Chloride: 100 mmol/L (ref 98–111)
Creatinine, Ser: 0.64 mg/dL (ref 0.44–1.00)
GFR, Estimated: >60 mL/min (ref >60)
Glucose, Bld: 97 mg/dL (ref 70–99)
Potassium: 3.2 mmol/L — ABNORMAL LOW (ref 3.5–5.1)
Sodium: 142 mmol/L (ref 135–145)
Total Bilirubin: 0.4 mg/dL (ref 0.0–1.2)
Total Protein: 5.8 g/dL — ABNORMAL LOW (ref 6.5–8.1)

## 2024-07-22 LAB — GLUCOSE, CAPILLARY
Glucose-Capillary: 106 mg/dL — ABNORMAL HIGH (ref 70–99)
Glucose-Capillary: 107 mg/dL — ABNORMAL HIGH (ref 70–99)
Glucose-Capillary: 108 mg/dL — ABNORMAL HIGH (ref 70–99)
Glucose-Capillary: 112 mg/dL — ABNORMAL HIGH (ref 70–99)
Glucose-Capillary: 118 mg/dL — ABNORMAL HIGH (ref 70–99)
Glucose-Capillary: 119 mg/dL — ABNORMAL HIGH (ref 70–99)

## 2024-07-22 LAB — PHOSPHORUS: Phosphorus: 2.5 mg/dL (ref 2.5–4.6)

## 2024-07-22 MED ORDER — POTASSIUM CHLORIDE 10 MEQ/100ML IV SOLN
10.0000 meq | Freq: Once | INTRAVENOUS | Status: AC
  Administered 2024-07-22: 10 meq via INTRAVENOUS

## 2024-07-22 MED ORDER — POTASSIUM CHLORIDE 10 MEQ/100ML IV SOLN
10.0000 meq | INTRAVENOUS | Status: AC
  Administered 2024-07-22 (×3): 10 meq via INTRAVENOUS
  Filled 2024-07-22 (×4): qty 100

## 2024-07-22 MED ORDER — SENNOSIDES-DOCUSATE SODIUM 8.6-50 MG PO TABS: 1.0000 | ORAL_TABLET | Freq: Every evening | ORAL | Status: DC | PRN

## 2024-07-22 MED ORDER — IPRATROPIUM-ALBUTEROL 0.5-2.5 (3) MG/3ML IN SOLN
3.0000 mL | Freq: Two times a day (BID) | RESPIRATORY_TRACT | Status: DC
  Filled 2024-07-22: qty 3

## 2024-07-22 MED ORDER — HYDRALAZINE HCL 20 MG/ML IJ SOLN: 10.0000 mg | INTRAMUSCULAR | Status: DC | PRN

## 2024-07-22 MED ORDER — METOPROLOL TARTRATE 5 MG/5ML IV SOLN: 5.0000 mg | INTRAVENOUS | Status: DC | PRN

## 2024-07-22 MED ORDER — GLUCAGON HCL RDNA (DIAGNOSTIC) 1 MG IJ SOLR: 1.0000 mg | INTRAMUSCULAR | Status: DC | PRN

## 2024-07-22 NOTE — Care Management Important Message (Signed)
 Important Message  Patient Details  Name: Alejandra Cruz MRN: [id number] Date of Birth: January 19, 1933   Important Message Given:  Yes - Medicare IM     Keshawn Sundberg L Effie Janoski 07/22/2024, 12:04 PM

## 2024-07-22 NOTE — NC FL2 (Signed)
 [place]  MEDICAID FL2 LEVEL OF CARE FORM     IDENTIFICATION  Patient Name: Alejandra Cruz Birthdate: 1933-06-22 Sex: female Admission Date (Current Location): 07/17/2024  [place] and [place] Number:  [place] [id number] L Facility and Address:  [facility],  [address] [place] [address]      Provider Number: [id number]  Attending Physician Name and Address:  Caleen Burgess BROCKS, MD  Relative Name and Phone Number:  Samuella, Rasool (Daughter)  [phone]    Current Level of Care: Hospital Recommended Level of Care: Skilled Nursing Facility, Nursing Facility Prior Approval Number:    Date Approved/Denied:   PASRR Number: [id number]  Discharge Plan: SNF    Current Diagnoses: Patient Active Problem List   Diagnosis Date Noted   Sepsis with acute renal failure without septic shock (HCC) 07/21/2024   Acute respiratory failure with hypoxia (HCC) 07/18/2024   Acute metabolic encephalopathy 07/18/2024   Septic shock (HCC) 07/17/2024   AKI (acute kidney injury) 07/17/2024   Pressure injury of skin 07/17/2024   Chronic pain syndrome 04/28/2024   Severe vascular dementia with agitation (HCC) 02/20/2024   Agitation due to dementia (HCC) 02/03/2024   Hypocalcemia 11/12/2023   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   Anemia, normocytic normochromic 02/20/2023   Delusions (HCC) 01/29/2023   Hallucinations 01/29/2023   Dementia with agitation (HCC) 12/11/2022   Parkinson disease (HCC) 03/10/2021   Morbid obesity (HCC) 02/14/2021   History of deep venous thrombosis (DVT) of distal vein of right lower extremity 11/29/2020   Unspecified protein-calorie malnutrition 11/02/2020   Thrombocytopenia 11/02/2020   Dementia with behavioral disturbance (HCC) 10/08/2019   Obesity (BMI 30-39.9) 10/05/2019   Osteoporosis 12/31/2018   Hypokalemia 11/24/2018   Chronic generalized pain 11/24/2018   Chronic bronchitis (HCC) 10/14/2017    Obsessive-compulsive disorder 08/03/2010   Chronic constipation 07/13/2009   Seizure disorder (HCC) 06/11/2009   Dyslipidemia 06/09/2009   Depression with anxiety 06/09/2009   Essential hypertension 06/09/2009    Orientation RESPIRATION BLADDER Height & Weight      (Disoriented x 4)  O2 (2-3L) Incontinent (Foley and Peri care) Weight: 207 lb 3.7 oz (94 kg) Height:  5' 8 (172.7 cm)  BEHAVIORAL SYMPTOMS/MOOD NEUROLOGICAL BOWEL NUTRITION STATUS      Incontinent Diet (See DC summary)  AMBULATORY STATUS COMMUNICATION OF NEEDS Skin   Extensive Assist Verbally PU Stage and Appropriate Care (Pressure Injury Coccyx Stage 4 - Full thickness tissue loss with exposed bone, tendon or muscle)       PU Stage 4 Dressing: BID               Personal Care Assistance Level of Assistance  Bathing, Feeding, Dressing Bathing Assistance: Maximum assistance Feeding assistance: Maximum assistance Dressing Assistance: Maximum assistance     Functional Limitations Info  Sight, Hearing, Speech Sight Info: Impaired Hearing Info: Impaired Speech Info: Adequate    SPECIAL CARE FACTORS FREQUENCY                       Contractures Contractures Info: Not present    Additional Factors Info  Code Status, Allergies Code Status Info: DNR Allergies Info: Remeron  (Mirtazapine )           Current Medications (07/22/2024):  This is the current hospital active medication list Current Facility-Administered Medications  Medication Dose Route Frequency Provider Last Rate Last Admin   acetaminophen  (TYLENOL ) tablet 650 mg  650 mg Oral Q6H PRN Vicci Afton CROME, MD  And   acetaminophen  (TYLENOL ) suppository 650 mg  650 mg Rectal Q4H PRN Johnson, Clanford L, MD   650 mg at 07/20/24 0930   cefTRIAXone (ROCEPHIN) 2 g in sodium chloride  0.9 % 100 mL IVPB  2 g Intravenous Q24H Johnson, Clanford L, MD   Stopped at 07/21/24 1510   Chlorhexidine  Gluconate Cloth 2 % PADS 6 each  6 each Topical Q0600  Darci Pore, MD   6 each at 07/22/24 0512   fluconazole (DIFLUCAN) IVPB 200 mg  200 mg Intravenous Q24H Jesus America, NP 100 mL/hr at 07/21/24 2337 200 mg at 07/21/24 2337   glucagon (human recombinant) (GLUCAGEN) injection 1 mg  1 mg Intravenous PRN Amin, Ankit C, MD       heparin injection 5,000 Units  5,000 Units Subcutaneous Q8H Sreeram, Narendranath, MD   5,000 Units at 07/22/24 9491   hydrALAZINE (APRESOLINE) injection 10 mg  10 mg Intravenous Q4H PRN Amin, Ankit C, MD       ipratropium-albuterol (DUONEB) 0.5-2.5 (3) MG/3ML nebulizer solution 3 mL  3 mL Nebulization Q4H PRN Jesus America, NP   3 mL at 07/18/24 2022   ipratropium-albuterol (DUONEB) 0.5-2.5 (3) MG/3ML nebulizer solution 3 mL  3 mL Nebulization BID Amin, Ankit C, MD       liver oil-zinc oxide (DESITIN) 40 % ointment   Topical BID Darci Pore, MD   Given at 07/22/24 0950   metoprolol tartrate (LOPRESSOR) injection 5 mg  5 mg Intravenous Q4H PRN Amin, Ankit C, MD       metroNIDAZOLE (FLAGYL) IVPB 500 mg  500 mg Intravenous Q12H Johnson, Clanford L, MD 100 mL/hr at 07/22/24 0508 500 mg at 07/22/24 0508   ondansetron  (ZOFRAN ) injection 4 mg  4 mg Intravenous Q6H PRN Jesus America, NP   4 mg at 07/18/24 0845   pantoprazole  (PROTONIX ) injection 40 mg  40 mg Intravenous Q12H Jesus America, NP   40 mg at 07/22/24 0949   senna-docusate (Senokot-S) tablet 1 tablet  1 tablet Oral QHS PRN Amin, Ankit C, MD         Discharge Medications: Please see discharge summary for a list of discharge medications.  Relevant Imaging Results:  Relevant Lab Results:   Additional Information SSN: [id number].   Hospice w/ [facility]  Hoy DELENA Bigness, LCSW

## 2024-07-22 NOTE — Progress Notes (Signed)
 Speech Language Pathology Treatment: Dysphagia  Patient Details Name: Alejandra Cruz MRN: [id number] DOB: 1933-08-29 Today's Date: 07/22/2024 Time: 8954-8889 SLP Time Calculation (min) (ACUTE ONLY): 25 min  Assessment / Plan / Recommendation Clinical Impression  Pt seen for a dysphagia f/u tx session with current diet of Dysphagia 1(puree)/nectar-thickened liquids with min verbal cues provided for initiating a swallow and minimizing oral retention.  Pt did not exhibit any overt s/s of aspiration with tsp amounts of nectar-thickened liquids and puree consistencies.  Pt agreeable to oral care prior to po attempts during dysphagia tx session with slight hyperactive gag response during lingual sweep.  Discussed education re: dysphagia symptoms with pt's son and plan for objective assessment (ie: MBS) for next date to assess current swallow function via fluoroscopy.  Continue current diet of Dysphagia 1(puree) and nectar-thickened liquids via tsp amounts with slow rate/small bites/sips and Full precautions/A during meals.  ST will f/u next date for MBS completion.     HPI HPI: 89 y.o. female with medical history significant of past medical history significant for dementia, hypertension, hyperlipidemia, restless leg syndrome, diverticulosis who is resident of skilled nursing facility presented to the emergency department for evaluation of altered mental status, fever, tachycardia, tachypnea. Patient has large decubitus ulcer which seems to be the source of sepsis. Initial blood pressure is low, patient's lactic acid is 4.7, white count 26, she has AKI. Patient will need further management evaluation of septic shock. Patient was started on IV vancomycin , cefepime with multiple IV fluid boluses to maintain blood pressure. Patient had high-grade fever, last night antibiotics changed to vancomycin  and meropenem. Started on Solu-Medrol  therapy. 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 BSE completed and pt placed on Dysphagia 1/nectar-thickened liquids. ST f/u for dysphagia tx.      SLP Plan  MBS          Recommendations  Diet recommendations: Dysphagia 1 (puree);Nectar-thick liquid Liquids provided via: Teaspoon;No straw Medication Administration: Crushed with puree Supervision: Trained caregiver to feed patient;Full supervision/cueing for compensatory strategies Compensations: Slow rate;Small sips/bites (wait for next swallow prior to next bite/sip) Postural Changes and/or Swallow Maneuvers: Seated upright 90 degrees                  Oral care BID;Staff/trained caregiver to provide oral care   Frequent or constant Supervision/Assistance Dysphagia, unspecified (R13.10)     MBS     Pat Alejandra Cruz,M.S.,CCC-SLP  07/22/2024, 12:33 PM

## 2024-07-22 NOTE — TOC Progression Note (Signed)
 Transition of Care [facility]) - Progression Note    Patient Details  Name: Alejandra Cruz MRN: [id number] Date of Birth: 1932-11-03  [facility] of [facility] CM/SW Contact  Hoy DELENA Bigness, LCSW Phone Number: 07/22/2024, 2:59 PM  Clinical Narrative:    Plan for pt to return to [facility] w. Hospice services. Family agreeable to this plan and have selected [facility] at hospice agency of choice. Referral sent to [facility] for hospice services. Spoke with Kirke at [facility] to confirm pt's discharge plans. CSW will continue to follow.   Expected Discharge Plan: Long Term Nursing Home (w/ Hospice) Barriers to Discharge: Barriers Resolved               Expected Discharge Plan and Services In-house Referral: Clinical Social Work, Hospice / Palliative Care Discharge Planning Services: NA Post Acute Care Choice: Hospice Living arrangements for the past 2 months: Skilled Nursing Facility                 DME Arranged: N/A DME Agency: NA                   Social Drivers of Health (SDOH) Interventions SDOH Screenings   Food Insecurity: Patient Unable To Answer (07/18/2024)  Housing: Patient Unable To Answer (07/18/2024)  Transportation Needs: Patient Unable To Answer (07/18/2024)  Utilities: Patient Unable To Answer (07/18/2024)  Depression (PHQ2-9): Low Risk  (01/31/2023)  Financial Resource Strain: Low Risk  (07/14/2018)  Physical Activity: Insufficiently Active (07/14/2018)  Social Connections: Patient Unable To Answer (07/18/2024)  Stress: Stress Concern Present (07/14/2018)  Tobacco Use: Low Risk  (07/17/2024)    Readmission Risk Interventions    07/22/2024    2:56 PM 07/18/2024   12:58 PM  Readmission Risk Prevention Plan  Transportation Screening Complete Complete  PCP or Specialist Appt within 5-7 Days Complete   PCP or Specialist Appt within 3-5 Days  Complete  Home Care Screening Complete   HRI or Home Care Consult  Complete  Social Work Consult for Recovery Care  Planning/Counseling  Complete  Palliative Care Screening  Not Applicable  Medication Review Oceanographer)  Complete

## 2024-07-22 NOTE — Progress Notes (Signed)
 PROGRESS NOTE    Alejandra Cruz  FMW:[id number] DOB: 03-15-1933 DOA: 07/17/2024 PCP: Landy Barnie RAMAN, NP    Brief Narrative:   89 y.o. female with medical history significant of past medical history significant for dementia, hypertension, hyperlipidemia, restless leg syndrome, diverticulosis who is resident of skilled nursing facility presented to the emergency department for evaluation of altered mental status, fever, tachycardia, tachypnea.  Patient has large decubitus ulcer which seems to be the source of sepsis.  Initial blood pressure is low, patient's lactic acid is 4.7, white count 26, she has AKI.  Patient will need further management evaluation of septic shock.  Initially started on broad-spectrum antibiotics and also received steroids.   Patient was started on IV vancomycin , cefepime with multiple IV fluid boluses to maintain blood pressure.  Patient had high-grade fever, last night antibiotics changed to vancomycin  and meropenem.  Started on Solu-Medrol  therapy.  Despite of aggressive medical measure patient's condition has not made any meaningful recovery therefore ongoing palliative care discussions to transition patient to comfort care.  Assessment & Plan:    Septic shock with multiorgan dysfunction Infected large decubitus ulcer  Proteus bacteremia Initially required pressors now off.  Blood cultures have grown Proteus.  Previous provider has discussed with pharmacy staff and antibiotics were adjusted.  Patient has failed to make any meaningful recovery at this time therefore palliative care team is following.  For now antibiotics are Rocephin and Flagyl. - Prognosis remains very poor especially in the setting of advanced dementia and advanced decubitus ulcer.   Acute metabolic encephalopathy -- slowly improving In the setting of sepsis.  She does have baseline dementia. Re-introducing diet in coordination with SLP therapist Continue supportive care. Delirium  precautions.   Hypernatremia -- Improved with D5 water   Acute hypoxic respiratory failure, improved Presented tachycardic, tachypneic, hypoxic. Patient is currently on 3 L supplemental oxygen to manage oxygen greater than 92%.  Continue to wean her oxygen.  Requested wean to 2L/min oxygen today.    AKI -- RESOLVED  Hyperkalemia-RESOLVED Admission creatinine 1.8, improved after IV fluids   Hypertension: Hold of blood pressures in the setting of septic shock, altered mental status.   Hyperlipidemia: Could consider resumption of statin once she is more alert, awake.   Advanced dementia: Patient is DNR/DNI.  Recommendations are to transition patient to comfort care.   Hypophosphatemia -- As needed repletion   Dysphagia -- Seen by speech, recommending dysphagia 1 diet.  Plans for MBS  Prognosis remains very guarded.  Have explained that patient's condition is overall uncurable therefore we do recommend hospice services.   DVT prophylaxis: sq heparin Code Status: DNRDNI  Family Communication: daughter at bedside updated 10/13  Disposition: TBD   PT Follow up Recs:   Subjective: Son at bedside Patient resting comfortably.  Answers any basic questions.   Examination:  General exam: Appears calm and comfortable, elderly frail, chronically ill-appearing Respiratory system: Clear to auscultation. Respiratory effort normal. Cardiovascular system: S1 & S2 heard, RRR. No JVD, murmurs, rubs, gallops or clicks. No pedal edema. Gastrointestinal system: Abdomen is nondistended, soft and nontender. No organomegaly or masses felt. Normal bowel sounds heard. Central nervous system: Alert and oriented to name only Extremities: Symmetric 5 x 5 power. 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 (Active)  Diet Orders (From admission, onward)     Start     Ordered   07/20/24 1601  DIET - DYS 1 Fluid consistency: Nectar Thick  Diet effective now       Comments: 1:1 feeder assist and only feed when alert and upright, po medication whole or crushed as able in puree.  Question:  Fluid consistency:  Answer:  Nectar Thick   07/20/24 1601            Objective: Vitals:   07/21/24 2016 07/22/24 0522 07/22/24 0713 07/22/24 0945  BP: 130/83 137/78  (!) 154/97  Pulse: (!) 101 87  68  Resp:    18  Temp: 99.7 F (37.6 C) 99.6 F (37.6 C)  97.8 F (36.6 C)  TempSrc: Axillary Oral  Oral  SpO2: 100% 100% 100% 99%  Weight:      Height:        Intake/Output Summary (Last 24 hours) at 07/22/2024 1150 Last data filed at 07/22/2024 0915 Gross per 24 hour  Intake 724.89 ml  Output 3800 ml  Net -3075.11 ml   Filed Weights   07/17/24 1138 07/17/24 1604  Weight: 94 kg 94 kg    Scheduled Meds:  Chlorhexidine  Gluconate Cloth  6 each Topical Q0600   heparin  5,000 Units Subcutaneous Q8H   ipratropium-albuterol  3 mL Nebulization TID   liver oil-zinc oxide   Topical BID   pantoprazole  (PROTONIX ) IV  40 mg Intravenous Q12H   Continuous Infusions:  cefTRIAXone (ROCEPHIN)  IV Stopped (07/21/24 1510)   fluconazole (DIFLUCAN) IV 200 mg (07/21/24 2337)   metronidazole 500 mg (07/22/24 0508)   potassium chloride  10 mEq (07/22/24 1008)    Nutritional status     Body mass index is 31.51 kg/m.  Data Reviewed:   CBC: Recent Labs  Lab 07/17/24 2335 07/18/24 0035 07/19/24 0317 07/20/24 0421 07/21/24 0437 07/22/24 0441  WBC 30.0* 36.4* 37.5* 33.8* 24.3* 19.9*  NEUTROABS 22.6* 27.1*  --  30.0* 21.4* 14.8*  HGB 11.1* 10.8* 10.3* 10.6* 10.7* 11.1*  HCT 36.1 35.0* 31.6* 32.8* 32.9* 34.0*  MCV 97.3 97.5 93.5 93.4 93.2 92.1  PLT 155 150 118* 116* 103* 123*   Basic Metabolic Panel: Recent Labs  Lab 07/18/24 0301 07/19/24 0317 07/20/24 0421 07/21/24 0437 07/22/24 0441  NA  143 144 146* 148* 142  K 4.4 4.1 3.6 3.6 3.2*  CL 106 109 110 106 100  CO2 22 25 28  32 35*  GLUCOSE 129* 151* 127* 106* 97  BUN 47* 36* 27* 16 11  CREATININE 1.88* 0.74 0.81 0.56 0.64  CALCIUM  8.0* 7.5* 7.8* 7.8* 7.8*  MG 2.1  --  2.3  --  1.9  PHOS 3.9  --  1.1* 1.8* 2.5   GFR: Estimated Creatinine Clearance: 57.1 mL/min (by C-G formula based on SCr of 0.64 mg/dL). Liver Function Tests: Recent Labs  Lab 07/17/24 1154 07/17/24 2104 07/20/24 0421 07/21/24 0437 07/22/24 0441  AST 51* 75* 38 28 19  ALT 9 22 60* 50* 36  ALKPHOS 88 98 190* 182* 130*  BILITOT 0.3 0.3 0.5 0.4 0.4  PROT 7.1 6.1* 5.9* 6.0* 5.8*  ALBUMIN 3.5 3.0* 3.0* 3.0* 2.9*   No results for input(s): LIPASE, AMYLASE in the last 168 hours. No results for input(s): AMMONIA in the last 168 hours. Coagulation Profile: Recent Labs  Lab 07/17/24 1154 07/18/24 0301  INR 1.5* 1.8*   Cardiac Enzymes: No results for input(s): CKTOTAL, CKMB, CKMBINDEX, TROPONINI in the last 168  hours. BNP (last 3 results) No results for input(s): PROBNP in the last 8760 hours. HbA1C: No results for input(s): HGBA1C in the last 72 hours. CBG: Recent Labs  Lab 07/21/24 2012 07/22/24 0033 07/22/24 0401 07/22/24 0716 07/22/24 1122  GLUCAP 128* 119* 108* 107* 112*   Lipid Profile: No results for input(s): CHOL, HDL, LDLCALC, TRIG, CHOLHDL, LDLDIRECT in the last 72 hours. Thyroid  Function Tests: No results for input(s): TSH, T4TOTAL, FREET4, T3FREE, THYROIDAB in the last 72 hours. Anemia Panel: No results for input(s): VITAMINB12, FOLATE, FERRITIN, TIBC, IRON, RETICCTPCT in the last 72 hours. Sepsis Labs: Recent Labs  Lab 07/17/24 2104 07/17/24 2335 07/18/24 0301 07/18/24 0548  LATICACIDVEN 5.3* 6.0* 5.3* 3.7*    Recent Results (from the past 240 hours)  Blood Culture (routine x 2)     Status: Abnormal (Preliminary result)   Collection Time: 07/17/24 11:54 AM    Specimen: BLOOD  Result Value Ref Range Status   Specimen Description   Final    BLOOD RIGHT ARM Performed at [facility], [address] [place], [place] [address]    Special Requests   Final    BOTTLES DRAWN AEROBIC AND ANAEROBIC Blood Culture adequate volume Performed at [facility], [address] [place], [place] [address]    Culture  Setup Time   Final    GRAM NEGATIVE RODS IN BOTH AEROBIC AND ANAEROBIC BOTTLES CRITICAL RESULT CALLED TO, READ BACK BY AND VERIFIED WITH: KINDLEY,C ON 07/18/24 AT 0334 BY PURDIE,J CRITICAL RESULT CALLED TO, READ BACK BY AND VERIFIED WITH: RN CATLIN ROGERS [id number] AT 0850 BY EC    Culture (A)  Final    PROTEUS MIRABILIS PROVIDENCIA STUARTII SUSCEPTIBILITIES TO FOLLOW Performed at [facility], [address], [place], [place] [address]    Report Status PENDING  Incomplete   Organism ID, Bacteria PROTEUS MIRABILIS  Final      Susceptibility   Proteus mirabilis - MIC*    AMPICILLIN <=2 SENSITIVE Sensitive     CEFAZOLIN  (NON-URINE) 4 INTERMEDIATE Intermediate     CEFEPIME <=0.12 SENSITIVE Sensitive     ERTAPENEM <=0.12 SENSITIVE Sensitive     CEFTRIAXONE <=0.25 SENSITIVE Sensitive     CIPROFLOXACIN 2 RESISTANT Resistant     GENTAMICIN <=1 SENSITIVE Sensitive     MEROPENEM 1 SENSITIVE Sensitive     TRIMETH/SULFA <=20 SENSITIVE Sensitive     AMPICILLIN/SULBACTAM <=2 SENSITIVE Sensitive     PIP/TAZO Value in next row Sensitive      <=4 SENSITIVEThis is a modified FDA-approved test that has been validated and its performance characteristics determined by the reporting laboratory.  This laboratory is certified under the Clinical Laboratory Improvement Amendments CLIA as qualified to perform high complexity clinical laboratory testing.    * PROTEUS MIRABILIS  Blood Culture ID Panel (Reflexed)     Status: Abnormal   Collection Time: 07/17/24 11:54 AM  Result Value Ref Range Status   Enterococcus faecalis NOT DETECTED NOT DETECTED Final    Enterococcus Faecium NOT DETECTED NOT DETECTED Final   Listeria monocytogenes NOT DETECTED NOT DETECTED Final   Staphylococcus species NOT DETECTED NOT DETECTED Final   Staphylococcus aureus (BCID) NOT DETECTED NOT DETECTED Final   Staphylococcus epidermidis NOT DETECTED NOT DETECTED Final   Staphylococcus lugdunensis NOT DETECTED NOT DETECTED Final   Streptococcus species NOT DETECTED NOT DETECTED Final   Streptococcus agalactiae NOT DETECTED NOT DETECTED Final   Streptococcus pneumoniae NOT DETECTED NOT DETECTED Final   Streptococcus pyogenes NOT DETECTED NOT  DETECTED Final   A.calcoaceticus-baumannii NOT DETECTED NOT DETECTED Final   Bacteroides fragilis NOT DETECTED NOT DETECTED Final   Enterobacterales DETECTED (A) NOT DETECTED Final    Comment: Enterobacterales represent a large order of gram negative bacteria, not a single organism. CRITICAL RESULT CALLED TO, READ BACK BY AND VERIFIED WITH: RN EDMONIA GOSLING [id number] AT 0850 BY EC    Enterobacter cloacae complex NOT DETECTED NOT DETECTED Final   Escherichia coli NOT DETECTED NOT DETECTED Final   Klebsiella aerogenes NOT DETECTED NOT DETECTED Final   Klebsiella oxytoca NOT DETECTED NOT DETECTED Final   Klebsiella pneumoniae NOT DETECTED NOT DETECTED Final   Proteus species DETECTED (A) NOT DETECTED Final    Comment: CRITICAL RESULT CALLED TO, READ BACK BY AND VERIFIED WITH: RN CATLIN ROGERS [id number] AT 0850 BY EC    Salmonella species NOT DETECTED NOT DETECTED Final   Serratia marcescens NOT DETECTED NOT DETECTED Final   Haemophilus influenzae NOT DETECTED NOT DETECTED Final   Neisseria meningitidis NOT DETECTED NOT DETECTED Final   Pseudomonas aeruginosa NOT DETECTED NOT DETECTED Final   Stenotrophomonas maltophilia NOT DETECTED NOT DETECTED Final   Candida albicans NOT DETECTED NOT DETECTED Final   Candida auris NOT DETECTED NOT DETECTED Final   Candida glabrata NOT DETECTED NOT DETECTED Final   Candida krusei NOT DETECTED  NOT DETECTED Final   Candida parapsilosis NOT DETECTED NOT DETECTED Final   Candida tropicalis NOT DETECTED NOT DETECTED Final   Cryptococcus neoformans/gattii NOT DETECTED NOT DETECTED Final   CTX-M ESBL NOT DETECTED NOT DETECTED Final   Carbapenem resistance IMP NOT DETECTED NOT DETECTED Final   Carbapenem resistance KPC NOT DETECTED NOT DETECTED Final   Carbapenem resistance NDM NOT DETECTED NOT DETECTED Final   Carbapenem resist OXA 48 LIKE NOT DETECTED NOT DETECTED Final   Carbapenem resistance VIM NOT DETECTED NOT DETECTED Final    Comment: Performed at [facility], [address] [place], [place] [address]  Blood Culture (routine x 2)     Status: Abnormal   Collection Time: 07/17/24 11:59 AM   Specimen: BLOOD  Result Value Ref Range Status   Specimen Description   Final    BLOOD LEFT ARM Performed at [facility], [address] [place], [place] [address]    Special Requests   Final    BOTTLES DRAWN AEROBIC AND ANAEROBIC Blood Culture results may not be optimal due to an inadequate volume of blood received in culture bottles Performed at [facility], [address], [place], [place] [address]    Culture  Setup Time   Final    GRAM NEGATIVE RODS IN BOTH AEROBIC AND ANAEROBIC BOTTLES CRITICAL RESULT CALLED TO, READ BACK BY AND VERIFIED WITH: KINDLEY,C ON 07/18/24 AT 0334 BY PURDIE,J    Culture (A)  Final    PROTEUS MIRABILIS SUSCEPTIBILITIES PERFORMED ON PREVIOUS CULTURE WITHIN THE LAST 5 DAYS. Performed at [facility], [address] [place], [place] [address]    Report Status 07/21/2024 FINAL  Final  MRSA Next Gen by PCR, Nasal     Status: None   Collection Time: 07/17/24  4:06 PM   Specimen: Nasal Mucosa; Nasal Swab  Result Value Ref Range Status   MRSA by PCR Next Gen NOT DETECTED NOT DETECTED Final    Comment: (NOTE) The GeneXpert MRSA Assay (FDA approved for NASAL specimens only), is one component of a comprehensive MRSA colonization  surveillance program. It is not intended to diagnose MRSA infection nor to  guide or monitor treatment for MRSA infections. Test performance is not FDA approved in patients less than 87 years old. Performed at [facility], [address] [place], [place] [address]   Respiratory (~20 pathogens) panel by PCR     Status: None   Collection Time: 07/17/24 11:32 PM   Specimen: Nasopharyngeal Swab; Respiratory  Result Value Ref Range Status   Adenovirus NOT DETECTED NOT DETECTED Final   Coronavirus 229E NOT DETECTED NOT DETECTED Final    Comment: (NOTE) The Coronavirus on the Respiratory Panel, DOES NOT test for the novel  Coronavirus (2019 nCoV)    Coronavirus HKU1 NOT DETECTED NOT DETECTED Final   Coronavirus NL63 NOT DETECTED NOT DETECTED Final   Coronavirus OC43 NOT DETECTED NOT DETECTED Final   Metapneumovirus NOT DETECTED NOT DETECTED Final   Rhinovirus / Enterovirus NOT DETECTED NOT DETECTED Final   Influenza A NOT DETECTED NOT DETECTED Final   Influenza B NOT DETECTED NOT DETECTED Final   Parainfluenza Virus 1 NOT DETECTED NOT DETECTED Final   Parainfluenza Virus 2 NOT DETECTED NOT DETECTED Final   Parainfluenza Virus 3 NOT DETECTED NOT DETECTED Final   Parainfluenza Virus 4 NOT DETECTED NOT DETECTED Final   Respiratory Syncytial Virus NOT DETECTED NOT DETECTED Final   Bordetella pertussis NOT DETECTED NOT DETECTED Final   Bordetella Parapertussis NOT DETECTED NOT DETECTED Final   Chlamydophila pneumoniae NOT DETECTED NOT DETECTED Final   Mycoplasma pneumoniae NOT DETECTED NOT DETECTED Final    Comment: Performed at [facility], [address], [place], [place] [address]  C Difficile Quick Screen w PCR reflex     Status: Abnormal   Collection Time: 07/18/24  7:27 PM   Specimen: STOOL  Result Value Ref Range Status   C Diff antigen POSITIVE (A) NEGATIVE Final   C Diff toxin NEGATIVE NEGATIVE Final   C Diff interpretation Results are indeterminate. See PCR results.  Final     Comment: Performed at [facility], [address], [place], [place] [address]  C. Diff by PCR, Reflexed     Status: None   Collection Time: 07/18/24  7:27 PM  Result Value Ref Range Status   Toxigenic C. Difficile by PCR NEGATIVE NEGATIVE Final    Comment: Patient is colonized with non toxigenic C. difficile. May not need treatment unless significant symptoms are present.   Hypervirulent Strain PRESUMPTIVE NEGATIVE PRESUMPTIVE NEGATIVE Final    Comment: Performed at [facility], [address] [place], [place] [address]         Radiology Studies: DG CHEST PORT 1 VIEW Result Date: 07/21/2024 CLINICAL DATA:  Abnormal lung sounds. EXAM: PORTABLE CHEST 1 VIEW COMPARISON:  07/20/2024 FINDINGS: The cardio pericardial silhouette is enlarged. There is pulmonary vascular congestion without overt pulmonary edema. Diffuse interstitial opacity suggests edema. Retrocardiac atelectasis or infiltrate with small left pleural effusion, similar to prior. Bones are diffusely demineralized. Degenerative changes noted in both shoulders. Telemetry leads overlie the chest. IMPRESSION: 1. Enlargement of the cardiopericardial silhouette with pulmonary vascular congestion and diffuse interstitial opacity suggesting edema. 2. Retrocardiac atelectasis or infiltrate with small left pleural effusion, similar to prior. Electronically Signed   By: Camellia Candle M.D.   On: 07/21/2024 05:43   DG CHEST PORT 1 VIEW Result Date: 07/20/2024 EXAM: 1 VIEW(S) XRAY OF THE CHEST 07/20/2024 03:22:00 PM COMPARISON: Chest x-ray 07/18/2024. CLINICAL HISTORY: Abnormal lung sounds. Per chart: medical history significant of past medical history significant for dementia, hypertension, hyperlipidemia, restless leg syndrome,  diverticulosis who is resident of skilled nursing facility presented to the emergency department for evaluation of altered mental status, fever, tachycardia, tachypnea. FINDINGS: LUNGS AND PLEURA: Persistent  retrocardiac airspace opacity limited evaluation due to overlying lines. Chronic coarsened interstitial markings. No pulmonary edema. No pleural effusion. No pneumothorax. HEART AND MEDIASTINUM: Atherosclerotic plaque. No acute abnormality of the cardiac and mediastinal silhouettes. BONES AND SOFT TISSUES: Severe degenerative changes of the left shoulder. No acute osseous abnormality. IMPRESSION: 1. Persistent retrocardiac airspace opacity, limited evaluation due to overlying lines. Recommend further evaluation with chest x-ray PA and lateral view 2. Atherosclerotic vascular calcifications. Electronically signed by: Morgane Naveau MD 07/20/2024 08:28 PM EDT RP Workstation: [id number]           LOS: 5 days   Time spent= 35 mins    Burgess JAYSON Dare, MD [facility]  If 7PM-7AM, please contact night-coverage  07/22/2024, 11:50 AM

## 2024-07-22 NOTE — Plan of Care (Signed)
  Problem: Clinical Measurements: Goal: Diagnostic test results will improve Outcome: Progressing   Problem: Safety: Goal: Ability to remain free from injury will improve Outcome: Progressing   Problem: Pain Managment: Goal: General experience of comfort will improve and/or be controlled Outcome: Progressing

## 2024-07-22 NOTE — Progress Notes (Signed)
 Daily Progress Note   Patient Name: Alejandra Cruz       Date: 07/22/2024 DOB: June 28, 1933  Age: 89 y.o. MRN#: [id number] Attending Physician: Caleen Burgess BROCKS, MD Primary Care Physician: Landy Barnie RAMAN, NP Admit Date: 07/17/2024  Reason for Consultation/Follow-up: Establishing goals of care  Subjective:   89 y.o. female  with past medical history of dementia, hypertension, hyperlipidemia, restless leg syndrome, diverticulosis admitted on 07/17/2024 with altered mental status.    Workup revealed findings concerning for septic shock with multiorgan dysfunction septic shock with multiorgan dysfunction secondary to infected large decubitus ulcer with Proteus bacteremia.  Initially required vasopressor support.  Also requiring supplemental O2.  Initiated on broad-spectrum antibiotics.   Initial completed 07/20/2024: Goals of care/advance care planning discussion.  Goals are to focus on comfort and quality of life.  DNR/DNI status confirmed.  Continue to treat the treatable and allow time for outcomes.  SLP consult to evaluate swallowing ability.  07/21/2024: Made some improvements. More alert but PO intake remains very limited. Followed up with family. Plan is to continue current regimen and optimize her here medically and at discharge shift focus to comfort and d/c with hospice support. Disposition location pending patient's ongoing response to treatment.   Today, labs independently reviewed.  Sodium levels have normalized. Having some mild hypokalemia noted. Recommend monitoring. Mild hypocalcemia noted in the setting of hypoalbuminemia. Albumin level 2.9. Low albumin levels are associated with greater disease burden and poorer long-term prognosis.  ALT normalized and ALP improving. Renal function improving. Would recommend continued monitoring.   CBC reviewed.  White blood cell count continues to improve with a reading of 19.9 today versus 24.3 yesterday.  Current antibiotic therapy appears effective.  Hemoglobin continues to improve.  Overall, labs stable/improving.  Vital signs appear relatively stable.  Some mild intermittent tachycardia noted.  Otherwise, no significant vital abnormalities. Medication administration record reviewed no as needed symptom meds given on 24-hour look back.  Independent history obtained from bedside nursing staff and family as patient is a poor historian at baseline due to dementia.  Per nursing, patient's p.o. intake has improved somewhat.  She ate 75% last night with dinner and ate an applesauce this morning with breakfast.  She was also able to work with speech therapy and had some juice and applesauce.  She also continues to appear little bit more alert.  She appears comfortable and no behavioral concerns.  Also spoke with speech therapy, they note that patient continues to tolerate current diet.  They continue to work with her.  However, in hopes of ensuring the best comfort and quality of  life they are recommending a modified barium swallow study to allow safest and least restrictive diet.  Spoke with patient's son in person at bedside and daughter via phone. We discussed patient's current clinical status. Discussed goals of care given current status. Her son states at this point the most important thing for him is that she have good quality of life and that she be comfortable.  Given these goals, we again discussed the utility of hospice at discharge with patient's son (in person) and daughter (via phone). Again, extensively reviewed hospice philosophy of care, including focus on comfort, quality of life, and support for patients and families facing terminal illness. Discussed eligibility criteria and potential benefits of enrollment.  Discussed what care would look like under hospice in the skilled nursing  facility and reviewed potential changes to medications and wound care.  Patient's son and daughter state they wishes to continue treating the treatable and antibiotic therapy here in the hospital but are interested in hospice referral at the time of discharge.  We discussed the various locations where hospice could occur including SNF versus inpatient.  Patient does not appear to meet inpatient criteria at this time.  Therefore, most appropriate for hospice support at her SNF at the time of discharge.  Son and daughter are in agreement with this plan.  Hospice preference is [facility].   Chart review/care coordination:  Completed extensive chart review including EPIC notes, labs, vital signs, MAR. Coordinated care with attending physician, bedside nursing staff, and TOC.    Length of Stay: 5   Physical Exam Constitutional:      General: She is not in acute distress.    Comments: Chronically ill appearing  Pulmonary:     Effort: Pulmonary effort is normal. No respiratory distress.  Skin:    General: Skin is warm and dry.  Neurological:     Mental Status: She is alert.             Vital Signs: BP (!) 154/97 (BP Location: Left Arm)   Pulse 68   Temp 97.8 F (36.6 C) (Oral)   Resp 18   Ht 5' 8 (1.727 m)   Wt 94 kg   SpO2 99%   BMI 31.51 kg/m  SpO2: SpO2: 99 % O2 Device: O2 Device: Nasal Cannula O2 Flow Rate: O2 Flow Rate (L/min): 2 L/min      Palliative Assessment/Data: Currently: 30%   Palliative Care Assessment & Plan   Patient Profile/Assessment:  89 year old female admitted for altered mental status diagnosed with septic shock secondary to large infected decubitus ulcer. Initial completed on 07/20/2024. DNR/DNI status confirmed. Goals of care comfort/quality of life. Plan is to allow time for outcomes and assess patient response. If no to minimal improvement with treatment then transition to comfort care. SLP consult for swallow evaluation. At discharge, disposition  support needs have been reviewed and are being considered. Likely will enroll in hospice.   On follow-up on 07/21/2024 and 07/22/2024, patient has some improved mental alertness.  Gradual improvements in p.o. intake.  Plan is for modified barium swallow study and continued follow-up with speech therapy.  Patient's family would like to continue to treat the treatable while here in the hospital.  Continue to discuss hospice with patient and family.  Discussed that she no longer appears to meet criteria for inpatient hospice.  Patient's family is interested in hospice support at her skilled nursing facility at discharge.  Family is hoping to ensure optimal comfort and quality of life at  discharge.    Recommendations/Plan:  DNR/DNI Time for outcomes SLP continue to treat MBS to evaluate least restrictive diet that is safe Dispo back to SNF with hospice support-hospice preference [facility] Palliative medicine team will continue to follow for ongoing goals of care discussion, symptom management, and coordination of care.    Symptom management:  Symptoms stable at present, therefore continue symptom regimen per admitting team with PMT available as needed for support   Prognosis:  < 6 months    Discharge Planning: Skilled Nursing Facility with Hospice    Detailed review of medical records (labs, imaging, vital signs), medically appropriate exam, discussed with treatment team, counseling and education to patient, family, & staff, documenting clinical information, medication management, coordination of care   Billing based on MDM: High  Problems Addressed: One acute or chronic illness or injury that poses a threat to life or bodily function  Amount and/or Complexity of Data: Category 1:Assessment requiring an independent historian(s) and Category 3:Discussion of management or test interpretation with external physician/other qualified health care professional/appropriate source (not  separately reported)  Risks: n/a         Laymon CHRISTELLA Pinal, NP  Palliative Medicine Team Team phone # [phone]  Thank you for allowing the Palliative Medicine Team to assist in the care of this patient. Please utilize secure chat with additional questions, if there is no response within 30 minutes please call the above phone number.  Palliative Medicine Team providers are available by phone from 7am to 7pm daily and can be reached through the team cell phone.  Should this patient require assistance outside of these hours, please call the patient's attending physician.

## 2024-07-23 ENCOUNTER — Inpatient Hospital Stay (HOSPITAL_COMMUNITY)

## 2024-07-23 DIAGNOSIS — R6521 Severe sepsis with septic shock: Secondary | ICD-10-CM | POA: Diagnosis not present

## 2024-07-23 DIAGNOSIS — A419 Sepsis, unspecified organism: Secondary | ICD-10-CM | POA: Diagnosis not present

## 2024-07-23 LAB — CBC WITH DIFFERENTIAL/PLATELET
Abs Immature Granulocytes: 0.49 K/uL — ABNORMAL HIGH (ref 0.00–0.07)
Basophils Absolute: 0.1 K/uL (ref 0.0–0.1)
Basophils Relative: 0 %
Eosinophils Absolute: 0.4 K/uL (ref 0.0–0.5)
Eosinophils Relative: 2 %
HCT: 33.5 % — ABNORMAL LOW (ref 36.0–46.0)
Hemoglobin: 10.7 g/dL — ABNORMAL LOW (ref 12.0–15.0)
Immature Granulocytes: 3 %
Lymphocytes Relative: 12 %
Lymphs Abs: 2.1 K/uL (ref 0.7–4.0)
MCH: 29.6 pg (ref 26.0–34.0)
MCHC: 31.9 g/dL (ref 30.0–36.0)
MCV: 92.5 fL (ref 80.0–100.0)
Monocytes Absolute: 1.6 K/uL — ABNORMAL HIGH (ref 0.1–1.0)
Monocytes Relative: 9 %
Neutro Abs: 13.4 K/uL — ABNORMAL HIGH (ref 1.7–7.7)
Neutrophils Relative %: 74 %
Platelets: 150 K/uL (ref 150–400)
RBC: 3.62 MIL/uL — ABNORMAL LOW (ref 3.87–5.11)
RDW: 14.1 % (ref 11.5–15.5)
WBC: 18 K/uL — ABNORMAL HIGH (ref 4.0–10.5)
nRBC: 0 % (ref 0.0–0.2)

## 2024-07-23 LAB — PHOSPHORUS: Phosphorus: 2.8 mg/dL (ref 2.5–4.6)

## 2024-07-23 LAB — CULTURE, BLOOD (ROUTINE X 2): Special Requests: ADEQUATE

## 2024-07-23 LAB — COMPREHENSIVE METABOLIC PANEL WITH GFR
ALT: 27 U/L (ref 0–44)
AST: 17 U/L (ref 15–41)
Albumin: 2.9 g/dL — ABNORMAL LOW (ref 3.5–5.0)
Alkaline Phosphatase: 107 U/L (ref 38–126)
Anion gap: 6 (ref 5–15)
BUN: 11 mg/dL (ref 8–23)
CO2: 36 mmol/L — ABNORMAL HIGH (ref 22–32)
Calcium: 7.8 mg/dL — ABNORMAL LOW (ref 8.9–10.3)
Chloride: 98 mmol/L (ref 98–111)
Creatinine, Ser: 0.54 mg/dL (ref 0.44–1.00)
GFR, Estimated: >60 mL/min (ref >60)
Glucose, Bld: 75 mg/dL (ref 70–99)
Potassium: 3.3 mmol/L — ABNORMAL LOW (ref 3.5–5.1)
Sodium: 140 mmol/L (ref 135–145)
Total Bilirubin: 0.3 mg/dL (ref 0.0–1.2)
Total Protein: 5.8 g/dL — ABNORMAL LOW (ref 6.5–8.1)

## 2024-07-23 LAB — GLUCOSE, CAPILLARY
Glucose-Capillary: 113 mg/dL — ABNORMAL HIGH (ref 70–99)
Glucose-Capillary: 116 mg/dL — ABNORMAL HIGH (ref 70–99)
Glucose-Capillary: 90 mg/dL (ref 70–99)
Glucose-Capillary: 98 mg/dL (ref 70–99)

## 2024-07-23 LAB — MAGNESIUM: Magnesium: 1.9 mg/dL (ref 1.7–2.4)

## 2024-07-23 MED ORDER — SODIUM CHLORIDE 0.9 % IV SOLN
2.0000 g | INTRAVENOUS | Status: DC
  Administered 2024-07-24: 2 g via INTRAVENOUS
  Filled 2024-07-23: qty 20

## 2024-07-23 NOTE — Progress Notes (Signed)
 [id number] Liaison Note  Received a referral for hospice services at home after discharge from [facility]. Spoke with Dwyane Pinal, daughter, to initiate education related to hospice philosophy, services and team approach to care. Alfreda verbalized understanding of information given.  Per discussion, the plan is for discharge back to facility when medically stable.  Dwyane Pinal [phone]) is the family contact.  No DME requested at this time.  Please send signed and completed DNR home with the patient.  Please provide prescriptions at discharge as needed to ensure ongoing symptom management.  [facility] information and contact numbers given to Alfreda.  Above information shared with Transitions of Care Manager.  Please call with any questions or concerns.  Thank you for the opportunity to participate in this patient's care.  Inocente Jacobs, BSN, RN [employer] [phone]

## 2024-07-23 NOTE — Evaluation (Addendum)
 Modified Barium Swallow Study  Patient Details  Name: Alejandra Cruz MRN: [id number] Date of Birth: 17-Oct-1932  Today's Date: 07/23/2024  HPI/PMH: HPI: 89 y.o. female with medical history significant of past medical history significant for dementia, hypertension, hyperlipidemia, restless leg syndrome, diverticulosis who is resident of skilled nursing facility presented to the emergency department for evaluation of altered mental status, fever, tachycardia, tachypnea. Patient has large decubitus ulcer which seems to be the source of sepsis. Initial blood pressure is low, patient's lactic acid is 4.7, white count 26, she has AKI. Patient will need further management evaluation of septic shock. Patient was started on IV vancomycin , cefepime with multiple IV fluid boluses to maintain blood pressure. Patient had high-grade fever, last night antibiotics changed to vancomycin  and meropenem. Started on Solu-Medrol  therapy. 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 BSE completed and pt placed on Dysphagia 1/nectar-thickened liquids. ST f/u for dysphagia tx/objective assessment.   Clinical Impression: Clinical Impression: Pt exhibited cognitive-based dysphagia c/b oral retention/holding, escape to lateral sulcus/floor of mouth with puree/solids and slow, prolonged mastication efforts d/t missing dentition/cognitive impairment.  Pt initiated swallow response across consistencies at the level of the valleculae, but this could be contributed to presbyphagia (aging swallow) which is typical for her age.  Trace residue noted on the lingual surface and vallecular space, but independent subsequent swallows cleared material fully.  Complete anterior hyoid  movement, epiglottic inversion adequate, laryngeal elevation and laryngeal vestibule closure noted.  No aspiration and/or penetration noted throughout study.  Recommend initiating a Dysphagia 2(minced)/thin liquid diet with FULL supervision for small bites/sips, initiating swallow and slow rate with Full A with meals for safety d/t cognitive impairment.  ST will f/u briefly in acute setting for dysphagia tx/education prior to d/c.  ST may f/u at next venue of care for dysphagia carry-over tx prn.  Thank you for this consult.  Factors that may increase risk of adverse event in presence of aspiration Noe & Lianne 2021): Factors that may increase risk of adverse event in presence of aspiration Noe & Lianne 2021): Poor general health and/or compromised immunity; Respiratory or GI disease; Reduced cognitive function; Frail or deconditioned; Weak cough   Recommendations/Plan: Swallowing Evaluation Recommendations Swallowing Evaluation Recommendations Recommendations: PO diet PO Diet Recommendation: Dysphagia 2 (Finely chopped); Thin liquids (Level 0) Liquid Administration via: Spoon; Straw; Other (Comment) (small sips) Medication Administration: Whole meds with puree Supervision: Full supervision/cueing for swallowing strategies; Staff to assist with self-feeding Swallowing strategies  : Slow rate; Small bites/sips (cues to initiate swallow prior to next bite/sip) Postural changes: Position pt fully upright for meals Oral care recommendations: Oral care BID (2x/day)    Treatment Plan Treatment Plan Treatment recommendations: Therapy as outlined in treatment plan below Follow-up recommendations: Follow physicians's recommendations for discharge plan and follow up therapies Functional status assessment: Patient has had a recent decline in their functional status and demonstrates the ability to make significant improvements in function in a reasonable and predictable amount of  time. Treatment frequency: Min 1x/week Treatment duration: 1 week Interventions: Aspiration precaution training; Compensatory techniques; Patient/family education; Diet toleration management by SLP     Recommendations Recommendations for follow up therapy are one component of a multi-disciplinary discharge planning process, led by the attending physician.  Recommendations may be updated based on patient status, additional functional criteria and insurance authorization.  Assessment: Orofacial Exam: Orofacial Exam Oral Cavity: Oral Hygiene: WFL Oral Cavity - Dentition: Edentulous Orofacial Anatomy: WFL Oral Motor/Sensory Function: Generalized oral weakness    Anatomy:  Anatomy: WFL   Boluses Administered: Boluses Administered Boluses Administered: Thin liquids (Level 0); Mildly thick liquids (Level 2, nectar thick); Moderately thick liquids (Level 3, honey thick); Puree; Solid     Oral Impairment Domain: Oral Impairment Domain Lip Closure: No labial escape Tongue control during bolus hold: Escape to lateral buccal cavity/floor of mouth Bolus preparation/mastication: Slow prolonged chewing/mashing with complete recollection Bolus transport/lingual motion: Delayed initiation of tongue motion (oral holding) Oral residue: Trace residue lining oral structures Location of oral residue : Tongue Initiation of pharyngeal swallow : Valleculae     Pharyngeal Impairment Domain: Pharyngeal Impairment Domain Soft palate elevation: No bolus between soft palate (SP)/pharyngeal wall (PW) Laryngeal elevation: Complete superior movement of thyroid  cartilage with complete approximation of arytenoids to epiglottic petiole Anterior hyoid excursion: Complete anterior movement Epiglottic movement: Complete inversion Laryngeal vestibule closure: Complete, no air/contrast in laryngeal vestibule Pharyngeal stripping wave : Present - complete Pharyngeal contraction (A/P view only):  N/A Pharyngoesophageal segment opening: Complete distension and complete duration, no obstruction of flow Tongue base retraction: No contrast between tongue base and posterior pharyngeal wall (PPW) Pharyngeal residue: Trace residue within or on pharyngeal structures Location of pharyngeal residue: Valleculae (cleared with subsequent independent swallows)     Esophageal Impairment Domain: Esophageal Impairment Domain Esophageal clearance upright position: Complete clearance, esophageal coating    Pill: Pill Consistency administered: Puree (whole barium tablet) Puree: WFL    Penetration/Aspiration Scale Score: Penetration/Aspiration Scale Score 1.  Material does not enter airway: Thin liquids (Level 0); Mildly thick liquids (Level 2, nectar thick); Moderately thick liquids (Level 3, honey thick); Puree; Solid; Pill    Compensatory Strategies: Compensatory Strategies Compensatory strategies: Yes Straw: Effective Effective Straw: Thin liquid (Level 0) Multiple swallows: Effective Effective Multiple Swallows: Moderately thick liquid (Level 3, honey thick); Puree Other(comment): -- (cues to initiate swallow)       General Information: Caregiver present: No   Diet Prior to this Study: Dysphagia 1 (pureed); Mildly thick liquids (Level 2, nectar thick)    Temperature : Normal    Respiratory Status: WFL    Supplemental O2: Nasal cannula (2L)    History of Recent Intubation: No   Behavior/Cognition: Alert; Requires cueing  Self-Feeding Abilities: Needs assist with self-feeding  Baseline vocal quality/speech: Hypophonia/low volume  Volitional Cough: Able to elicit  Volitional Swallow: Able to elicit  Exam Limitations: No limitations   Goal Planning: Prognosis for improved oropharyngeal function: Good  Barriers to Reach Goals: Cognitive deficits  No data recorded Patient/Family Stated Goal: n/a  Consulted and agree with results and recommendations: Patient;  Family member/caregiver; Physician; Nurse   Pain: Pain Assessment Pain Assessment: Faces Faces Pain Scale: 0 Breathing: 0 Negative Vocalization: 1 Facial Expression: 2 Body Language: 0 Consolability: 0 PAINAD Score: 1 Pain Location: back Pain Descriptors / Indicators: Discomfort; Grimacing Pain Intervention(s): Limited activity within patient's tolerance; Repositioned    End of Session: Start Time:SLP Start Time (ACUTE ONLY): 0909  Stop Time: SLP Stop Time (ACUTE ONLY): 9076  Time Calculation:SLP Time Calculation (min) (ACUTE ONLY): 14 min  Charges: SLP Evaluations $ SLP Speech Visit: 1 Visit  SLP Evaluations $MBS Swallow: 1 Procedure $Swallowing Treatment: 1 Procedure   SLP visit diagnosis: SLP Visit Diagnosis: Dysphagia, unspecified (R13.10)  Past Medical History:  Past Medical History:  Diagnosis Date   Diverticulosis OCT 2010 TCS   SIGMOID COLON   Esophageal web 10/08/2008   DILATION 16 MM   Gastritis, Helicobacter pylori 10/08/2008   Rx. ABO for 10 days    GERD (gastroesophageal reflux disease)    Hip fracture, left (HCC)    2016   Hyperlipidemia    Hypertension    Restless legs syndrome    Past Surgical History:  Past Surgical History:  Procedure Laterality Date   ABDOMINAL HYSTERECTOMY     COLONOSCOPY  OCT 2010   TORTUOUS, Sml IH, Tics, MULTIPLE SIMPLE ADENOMAS (<6MM)   HIP ARTHROPLASTY Left 11/19/2014   Procedure: LEFT PARTIAL HIP REPLACEMENT;  Surgeon: Taft FORBES Minerva, MD;  Location: [facility];  Service: Orthopedics;  Laterality: Left;   ORIF HIP FRACTURE  04/05/2012   Procedure: OPEN REDUCTION INTERNAL FIXATION HIP;  Surgeon: Taft FORBES Minerva, MD;  Location: [facility];  Service: Orthopedics;  Laterality: Right;   UPPER GASTROINTESTINAL ENDOSCOPY  AUG 2010   [facility]ARY   VESICOVAGINAL FISTULA CLOSURE W/ TAH  1966   bleeding     Pat Chawn Spraggins,M.S.,CCC-SLP 07/23/2024, 10:59 AM

## 2024-07-23 NOTE — Progress Notes (Signed)
 PROGRESS NOTE    Alejandra Cruz  FMW:[id number] DOB: 10/10/1932 DOA: 07/17/2024 PCP: Landy Barnie RAMAN, NP    Brief Narrative:   89 y.o. female with medical history significant of past medical history significant for dementia, hypertension, hyperlipidemia, restless leg syndrome, diverticulosis who is resident of skilled nursing facility presented to the emergency department for evaluation of altered mental status, fever, tachycardia, tachypnea.  Patient has large decubitus ulcer which seems to be the source of sepsis.  Initial blood pressure is low, patient's lactic acid is 4.7, white count 26, she has AKI.  Patient will need further management evaluation of septic shock.  Initially started on broad-spectrum antibiotics and also received steroids.   Patient was started on IV vancomycin , cefepime with multiple IV fluid boluses to maintain blood pressure.  Patient had high-grade fever, last night antibiotics changed to vancomycin  and meropenem.  Started on Solu-Medrol  therapy.  Despite of aggressive medical measure patient's condition has not made any meaningful recovery therefore ongoing palliative care discussions to transition patient to comfort care.  Plan would be transition back to her facility with hospice to follow in next 1-2 days.  Assessment & Plan:    Septic shock with multiorgan dysfunction Infected large decubitus ulcer  Proteus bacteremia Initially required pressors now off.  Blood cultures have grown Proteus.  Previous provider has discussed with pharmacy staff and antibiotics were adjusted.  Patient has failed to make any meaningful recovery at this time therefore palliative care team is following.  For now antibiotics are Rocephin and Flagyl.  Will plan for total of 10 days, EOT 10/19.  Transition to p.o. Bactrim upon discharge, discussed with ID pharmacy. - Prognosis remains very poor especially in the setting of advanced dementia and advanced decubitus ulcer.   Acute  metabolic encephalopathy -- slowly improving In the setting of sepsis.  She does have baseline dementia. This has improved   Hypernatremia -Resolved   Acute hypoxic respiratory failure, improved Presented tachycardic, tachypneic, hypoxic. Patient is currently on 3 L supplemental oxygen to manage oxygen greater than 92%.  Continue to wean her oxygen.  Requested wean to 2L/min oxygen today.    AKI -- RESOLVED  Hyperkalemia-RESOLVED Admission creatinine 1.8, improved after IV fluids   Hypertension: Hold of blood pressures in the setting of septic shock, altered mental status.   Hyperlipidemia: Could consider resumption of statin once she is more alert, awake.   Advanced dementia: Patient is DNR/DNI.  Recommendations are to transition patient to comfort care.   Hypophosphatemia -- As needed repletion   Dysphagia -- Seen by speech, recommending dysphagia 1 diet.  With MBS she did quite well therefore we may be able to change her diet to dysphagia to  Prognosis remains very guarded.  Have explained that patient's condition is overall uncurable therefore we do recommend hospice services.  TOC has consulted their service.   DVT prophylaxis: sq heparin Code Status: DNRDNI  Family Communication: Family at bedside Disposition: TBD   PT Follow up Recs:   Subjective:  Doing okay no complaints.  Resting comfortably. Son and daughter at bedside.  Examination:  General exam: Appears calm and comfortable, elderly frail, chronically ill-appearing Respiratory system: Clear to auscultation. Respiratory effort normal. Cardiovascular system: S1 & S2 heard, RRR. No JVD, murmurs, rubs, gallops or clicks. No pedal edema. Gastrointestinal system: Abdomen is nondistended, soft and nontender. No organomegaly or masses felt. Normal bowel sounds heard. Central nervous system: Alert and oriented to name only Extremities: Symmetric 5 x 5 power. Skin:  No rashes, lesions or ulcers Psychiatry:  Judgement and insight appear poor Coccyx ulcer stage IV           Wound 07/17/24 1602 Pressure Injury Coccyx Stage 4 - Full thickness tissue loss with exposed bone, tendon or muscle. (Active)     Diet Orders (From admission, onward)     Start     Ordered   07/23/24 1112  DIET DYS 2 Room service appropriate? Yes; Fluid consistency: Thin  Diet effective now       Question Answer Comment  Room service appropriate? Yes   Fluid consistency: Thin      07/23/24 1111            Objective: Vitals:   07/22/24 0945 07/22/24 1227 07/22/24 2029 07/23/24 0404  BP: (!) 154/97 (!) 145/85 132/79 139/79  Pulse: 68 61 84 79  Resp: 18 18 (!) 24 18  Temp: 97.8 F (36.6 C) 98.8 F (37.1 C) 98 F (36.7 C) (!) 97.4 F (36.3 C)  TempSrc: Oral Axillary Oral Oral  SpO2: 99% 99% 100% 100%  Weight:      Height:        Intake/Output Summary (Last 24 hours) at 07/23/2024 1115 Last data filed at 07/23/2024 0827 Gross per 24 hour  Intake 380 ml  Output 1900 ml  Net -1520 ml   Filed Weights   07/17/24 1138 07/17/24 1604  Weight: 94 kg 94 kg    Scheduled Meds:  Chlorhexidine  Gluconate Cloth  6 each Topical Q0600   heparin  5,000 Units Subcutaneous Q8H   liver oil-zinc oxide   Topical BID   pantoprazole  (PROTONIX ) IV  40 mg Intravenous Q12H   Continuous Infusions:  [START ON 07/24/2024] cefTRIAXone (ROCEPHIN)  IV     fluconazole (DIFLUCAN) IV 200 mg (07/23/24 0045)   metronidazole 500 mg (07/23/24 9366)    Nutritional status     Body mass index is 31.51 kg/m.  Data Reviewed:   CBC: Recent Labs  Lab 07/18/24 0035 07/19/24 0317 07/20/24 0421 07/21/24 0437 07/22/24 0441 07/23/24 0425  WBC 36.4* 37.5* 33.8* 24.3* 19.9* 18.0*  NEUTROABS 27.1*  --  30.0* 21.4* 14.8* 13.4*  HGB 10.8* 10.3* 10.6* 10.7* 11.1* 10.7*  HCT 35.0* 31.6* 32.8* 32.9* 34.0* 33.5*  MCV 97.5 93.5 93.4 93.2 92.1 92.5  PLT 150 118* 116* 103* 123* 150   Basic Metabolic Panel: Recent Labs  Lab  07/18/24 0301 07/19/24 0317 07/20/24 0421 07/21/24 0437 07/22/24 0441 07/23/24 0425  NA 143 144 146* 148* 142 140  K 4.4 4.1 3.6 3.6 3.2* 3.3*  CL 106 109 110 106 100 98  CO2 22 25 28  32 35* 36*  GLUCOSE 129* 151* 127* 106* 97 75  BUN 47* 36* 27* 16 11 11   CREATININE 1.88* 0.74 0.81 0.56 0.64 0.54  CALCIUM  8.0* 7.5* 7.8* 7.8* 7.8* 7.8*  MG 2.1  --  2.3  --  1.9 1.9  PHOS 3.9  --  1.1* 1.8* 2.5 2.8   GFR: Estimated Creatinine Clearance: 57.1 mL/min (by C-G formula based on SCr of 0.54 mg/dL). Liver Function Tests: Recent Labs  Lab 07/17/24 2104 07/20/24 0421 07/21/24 0437 07/22/24 0441 07/23/24 0425  AST 75* 38 28 19 17   ALT 22 60* 50* 36 27  ALKPHOS 98 190* 182* 130* 107  BILITOT 0.3 0.5 0.4 0.4 0.3  PROT 6.1* 5.9* 6.0* 5.8* 5.8*  ALBUMIN 3.0* 3.0* 3.0* 2.9* 2.9*   No results for input(s): LIPASE, AMYLASE in the last 168  hours. No results for input(s): AMMONIA in the last 168 hours. Coagulation Profile: Recent Labs  Lab 07/17/24 1154 07/18/24 0301  INR 1.5* 1.8*   Cardiac Enzymes: No results for input(s): CKTOTAL, CKMB, CKMBINDEX, TROPONINI in the last 168 hours. BNP (last 3 results) No results for input(s): PROBNP in the last 8760 hours. HbA1C: No results for input(s): HGBA1C in the last 72 hours. CBG: Recent Labs  Lab 07/22/24 1623 07/22/24 1955 07/23/24 0049 07/23/24 0736 07/23/24 1107  GLUCAP 106* 118* 90 98 116*   Lipid Profile: No results for input(s): CHOL, HDL, LDLCALC, TRIG, CHOLHDL, LDLDIRECT in the last 72 hours. Thyroid  Function Tests: No results for input(s): TSH, T4TOTAL, FREET4, T3FREE, THYROIDAB in the last 72 hours. Anemia Panel: No results for input(s): VITAMINB12, FOLATE, FERRITIN, TIBC, IRON, RETICCTPCT in the last 72 hours. Sepsis Labs: Recent Labs  Lab 07/17/24 2104 07/17/24 2335 07/18/24 0301 07/18/24 0548  LATICACIDVEN 5.3* 6.0* 5.3* 3.7*    Recent Results (from the  past 240 hours)  Blood Culture (routine x 2)     Status: Abnormal   Collection Time: 07/17/24 11:54 AM   Specimen: BLOOD  Result Value Ref Range Status   Specimen Description   Final    BLOOD RIGHT ARM Performed at [facility], [address] [place], [place] [address]    Special Requests   Final    BOTTLES DRAWN AEROBIC AND ANAEROBIC Blood Culture adequate volume Performed at [facility], [address], [place], [place] [address]    Culture  Setup Time   Final    GRAM NEGATIVE RODS IN BOTH AEROBIC AND ANAEROBIC BOTTLES CRITICAL RESULT CALLED TO, READ BACK BY AND VERIFIED WITH: KINDLEY,C ON 07/18/24 AT 0334 BY PURDIE,J CRITICAL RESULT CALLED TO, READ BACK BY AND VERIFIED WITH: RN CATLIN ROGERS [id number] AT 0850 BY EC Performed at [facility], [address], [place], [place] [address]    Culture PROTEUS MIRABILIS PROVIDENCIA STUARTII  (A)  Final   Report Status 07/23/2024 FINAL  Final   Organism ID, Bacteria PROTEUS MIRABILIS  Final   Organism ID, Bacteria PROVIDENCIA STUARTII  Final      Susceptibility   Proteus mirabilis - MIC*    AMPICILLIN <=2 SENSITIVE Sensitive     CEFAZOLIN  (NON-URINE) 4 INTERMEDIATE Intermediate     CEFEPIME <=0.12 SENSITIVE Sensitive     ERTAPENEM <=0.12 SENSITIVE Sensitive     CEFTRIAXONE <=0.25 SENSITIVE Sensitive     CIPROFLOXACIN 2 RESISTANT Resistant     GENTAMICIN <=1 SENSITIVE Sensitive     MEROPENEM 1 SENSITIVE Sensitive     TRIMETH/SULFA <=20 SENSITIVE Sensitive     AMPICILLIN/SULBACTAM <=2 SENSITIVE Sensitive     PIP/TAZO Value in next row Sensitive      <=4 SENSITIVEThis is a modified FDA-approved test that has been validated and its performance characteristics determined by the reporting laboratory.  This laboratory is certified under the Clinical Laboratory Improvement Amendments CLIA as qualified to perform high complexity clinical laboratory testing.    * PROTEUS MIRABILIS   Providencia stuartii - MIC*    AMPICILLIN Value in  next row Resistant      <=4 SENSITIVEThis is a modified FDA-approved test that has been validated and its performance characteristics determined by the reporting laboratory.  This laboratory is certified under the Clinical Laboratory Improvement Amendments CLIA as qualified to perform high complexity clinical laboratory testing.    CEFEPIME Value in next row Sensitive      <=4 SENSITIVEThis is a modified  FDA-approved test that has been validated and its performance characteristics determined by the reporting laboratory.  This laboratory is certified under the Clinical Laboratory Improvement Amendments CLIA as qualified to perform high complexity clinical laboratory testing.    ERTAPENEM Value in next row Sensitive      <=4 SENSITIVEThis is a modified FDA-approved test that has been validated and its performance characteristics determined by the reporting laboratory.  This laboratory is certified under the Clinical Laboratory Improvement Amendments CLIA as qualified to perform high complexity clinical laboratory testing.    CEFTRIAXONE Value in next row Sensitive      <=4 SENSITIVEThis is a modified FDA-approved test that has been validated and its performance characteristics determined by the reporting laboratory.  This laboratory is certified under the Clinical Laboratory Improvement Amendments CLIA as qualified to perform high complexity clinical laboratory testing.    CIPROFLOXACIN Value in next row Sensitive      <=4 SENSITIVEThis is a modified FDA-approved test that has been validated and its performance characteristics determined by the reporting laboratory.  This laboratory is certified under the Clinical Laboratory Improvement Amendments CLIA as qualified to perform high complexity clinical laboratory testing.    GENTAMICIN Value in next row Resistant      <=4 SENSITIVEThis is a modified FDA-approved test that has been validated and its performance characteristics determined by the reporting  laboratory.  This laboratory is certified under the Clinical Laboratory Improvement Amendments CLIA as qualified to perform high complexity clinical laboratory testing.    MEROPENEM Value in next row Sensitive      <=4 SENSITIVEThis is a modified FDA-approved test that has been validated and its performance characteristics determined by the reporting laboratory.  This laboratory is certified under the Clinical Laboratory Improvement Amendments CLIA as qualified to perform high complexity clinical laboratory testing.    TRIMETH/SULFA Value in next row Sensitive      <=4 SENSITIVEThis is a modified FDA-approved test that has been validated and its performance characteristics determined by the reporting laboratory.  This laboratory is certified under the Clinical Laboratory Improvement Amendments CLIA as qualified to perform high complexity clinical laboratory testing.    AMPICILLIN/SULBACTAM Value in next row Sensitive      <=4 SENSITIVEThis is a modified FDA-approved test that has been validated and its performance characteristics determined by the reporting laboratory.  This laboratory is certified under the Clinical Laboratory Improvement Amendments CLIA as qualified to perform high complexity clinical laboratory testing.    PIP/TAZO Value in next row Sensitive      <=4 SENSITIVEThis is a modified FDA-approved test that has been validated and its performance characteristics determined by the reporting laboratory.  This laboratory is certified under the Clinical Laboratory Improvement Amendments CLIA as qualified to perform high complexity clinical laboratory testing.    * PROVIDENCIA STUARTII  Blood Culture ID Panel (Reflexed)     Status: Abnormal   Collection Time: 07/17/24 11:54 AM  Result Value Ref Range Status   Enterococcus faecalis NOT DETECTED NOT DETECTED Final   Enterococcus Faecium NOT DETECTED NOT DETECTED Final   Listeria monocytogenes NOT DETECTED NOT DETECTED Final   Staphylococcus  species NOT DETECTED NOT DETECTED Final   Staphylococcus aureus (BCID) NOT DETECTED NOT DETECTED Final   Staphylococcus epidermidis NOT DETECTED NOT DETECTED Final   Staphylococcus lugdunensis NOT DETECTED NOT DETECTED Final   Streptococcus species NOT DETECTED NOT DETECTED Final   Streptococcus agalactiae NOT DETECTED NOT DETECTED Final   Streptococcus pneumoniae NOT DETECTED NOT DETECTED Final  Streptococcus pyogenes NOT DETECTED NOT DETECTED Final   A.calcoaceticus-baumannii NOT DETECTED NOT DETECTED Final   Bacteroides fragilis NOT DETECTED NOT DETECTED Final   Enterobacterales DETECTED (A) NOT DETECTED Final    Comment: Enterobacterales represent a large order of gram negative bacteria, not a single organism. CRITICAL RESULT CALLED TO, READ BACK BY AND VERIFIED WITH: RN EDMONIA GOSLING [id number] AT 0850 BY EC    Enterobacter cloacae complex NOT DETECTED NOT DETECTED Final   Escherichia coli NOT DETECTED NOT DETECTED Final   Klebsiella aerogenes NOT DETECTED NOT DETECTED Final   Klebsiella oxytoca NOT DETECTED NOT DETECTED Final   Klebsiella pneumoniae NOT DETECTED NOT DETECTED Final   Proteus species DETECTED (A) NOT DETECTED Final    Comment: CRITICAL RESULT CALLED TO, READ BACK BY AND VERIFIED WITH: RN CATLIN ROGERS [id number] AT 0850 BY EC    Salmonella species NOT DETECTED NOT DETECTED Final   Serratia marcescens NOT DETECTED NOT DETECTED Final   Haemophilus influenzae NOT DETECTED NOT DETECTED Final   Neisseria meningitidis NOT DETECTED NOT DETECTED Final   Pseudomonas aeruginosa NOT DETECTED NOT DETECTED Final   Stenotrophomonas maltophilia NOT DETECTED NOT DETECTED Final   Candida albicans NOT DETECTED NOT DETECTED Final   Candida auris NOT DETECTED NOT DETECTED Final   Candida glabrata NOT DETECTED NOT DETECTED Final   Candida krusei NOT DETECTED NOT DETECTED Final   Candida parapsilosis NOT DETECTED NOT DETECTED Final   Candida tropicalis NOT DETECTED NOT DETECTED Final    Cryptococcus neoformans/gattii NOT DETECTED NOT DETECTED Final   CTX-M ESBL NOT DETECTED NOT DETECTED Final   Carbapenem resistance IMP NOT DETECTED NOT DETECTED Final   Carbapenem resistance KPC NOT DETECTED NOT DETECTED Final   Carbapenem resistance NDM NOT DETECTED NOT DETECTED Final   Carbapenem resist OXA 48 LIKE NOT DETECTED NOT DETECTED Final   Carbapenem resistance VIM NOT DETECTED NOT DETECTED Final    Comment: Performed at [facility], [address] [place], [place] [address]  Blood Culture (routine x 2)     Status: Abnormal   Collection Time: 07/17/24 11:59 AM   Specimen: BLOOD  Result Value Ref Range Status   Specimen Description   Final    BLOOD LEFT ARM Performed at [facility], [address], [place], [place] [address]    Special Requests   Final    BOTTLES DRAWN AEROBIC AND ANAEROBIC Blood Culture results may not be optimal due to an inadequate volume of blood received in culture bottles Performed at [facility], [address], [place], [place] [address]    Culture  Setup Time   Final    GRAM NEGATIVE RODS IN BOTH AEROBIC AND ANAEROBIC BOTTLES CRITICAL RESULT CALLED TO, READ BACK BY AND VERIFIED WITH: KINDLEY,C ON 07/18/24 AT 0334 BY PURDIE,J    Culture (A)  Final    PROTEUS MIRABILIS SUSCEPTIBILITIES PERFORMED ON PREVIOUS CULTURE WITHIN THE LAST 5 DAYS. Performed at [facility], [address], [place], [place] [address]    Report Status 07/21/2024 FINAL  Final  MRSA Next Gen by PCR, Nasal     Status: None   Collection Time: 07/17/24  4:06 PM   Specimen: Nasal Mucosa; Nasal Swab  Result Value Ref Range Status   MRSA by PCR Next Gen NOT DETECTED NOT DETECTED Final    Comment: (NOTE) The GeneXpert MRSA Assay (FDA approved for NASAL specimens only), is one component of a comprehensive MRSA colonization surveillance program. It is not intended to diagnose  MRSA infection nor to guide or monitor treatment for MRSA infections. Test performance  is not FDA approved in patients less than 56 years old. Performed at [facility], [address], [place], [place] [address]   Respiratory (~20 pathogens) panel by PCR     Status: None   Collection Time: 07/17/24 11:32 PM   Specimen: Nasopharyngeal Swab; Respiratory  Result Value Ref Range Status   Adenovirus NOT DETECTED NOT DETECTED Final   Coronavirus 229E NOT DETECTED NOT DETECTED Final    Comment: (NOTE) The Coronavirus on the Respiratory Panel, DOES NOT test for the novel  Coronavirus (2019 nCoV)    Coronavirus HKU1 NOT DETECTED NOT DETECTED Final   Coronavirus NL63 NOT DETECTED NOT DETECTED Final   Coronavirus OC43 NOT DETECTED NOT DETECTED Final   Metapneumovirus NOT DETECTED NOT DETECTED Final   Rhinovirus / Enterovirus NOT DETECTED NOT DETECTED Final   Influenza A NOT DETECTED NOT DETECTED Final   Influenza B NOT DETECTED NOT DETECTED Final   Parainfluenza Virus 1 NOT DETECTED NOT DETECTED Final   Parainfluenza Virus 2 NOT DETECTED NOT DETECTED Final   Parainfluenza Virus 3 NOT DETECTED NOT DETECTED Final   Parainfluenza Virus 4 NOT DETECTED NOT DETECTED Final   Respiratory Syncytial Virus NOT DETECTED NOT DETECTED Final   Bordetella pertussis NOT DETECTED NOT DETECTED Final   Bordetella Parapertussis NOT DETECTED NOT DETECTED Final   Chlamydophila pneumoniae NOT DETECTED NOT DETECTED Final   Mycoplasma pneumoniae NOT DETECTED NOT DETECTED Final    Comment: Performed at [facility], [address] [place], [place] [address]  C Difficile Quick Screen w PCR reflex     Status: Abnormal   Collection Time: 07/18/24  7:27 PM   Specimen: STOOL  Result Value Ref Range Status   C Diff antigen POSITIVE (A) NEGATIVE Final   C Diff toxin NEGATIVE NEGATIVE Final   C Diff interpretation Results are indeterminate. See PCR results.  Final    Comment: Performed at [facility], [address], [place], [place] [address]  C. Diff by PCR, Reflexed     Status: None   Collection  Time: 07/18/24  7:27 PM  Result Value Ref Range Status   Toxigenic C. Difficile by PCR NEGATIVE NEGATIVE Final    Comment: Patient is colonized with non toxigenic C. difficile. May not need treatment unless significant symptoms are present.   Hypervirulent Strain PRESUMPTIVE NEGATIVE PRESUMPTIVE NEGATIVE Final    Comment: Performed at [facility], [address] [place], [place] [address]         Radiology Studies: No results found.         LOS: 6 days   Time spent= 35 mins    Burgess JAYSON Dare, MD [facility]  If 7PM-7AM, please contact night-coverage  07/23/2024, 11:15 AM

## 2024-07-23 NOTE — Plan of Care (Signed)

## 2024-07-24 DIAGNOSIS — A419 Sepsis, unspecified organism: Secondary | ICD-10-CM | POA: Diagnosis not present

## 2024-07-24 DIAGNOSIS — G20A2 Parkinson's disease without dyskinesia, with fluctuations: Secondary | ICD-10-CM | POA: Diagnosis not present

## 2024-07-24 DIAGNOSIS — N179 Acute kidney failure, unspecified: Secondary | ICD-10-CM | POA: Diagnosis not present

## 2024-07-24 DIAGNOSIS — L89304 Pressure ulcer of unspecified buttock, stage 4: Secondary | ICD-10-CM | POA: Diagnosis not present

## 2024-07-24 LAB — CBC WITH DIFFERENTIAL/PLATELET
Abs Immature Granulocytes: 0.29 K/uL — ABNORMAL HIGH (ref 0.00–0.07)
Basophils Absolute: 0.1 K/uL (ref 0.0–0.1)
Basophils Relative: 0 %
Eosinophils Absolute: 0.1 K/uL (ref 0.0–0.5)
Eosinophils Relative: 1 %
HCT: 36.1 % (ref 36.0–46.0)
Hemoglobin: 11.6 g/dL — ABNORMAL LOW (ref 12.0–15.0)
Immature Granulocytes: 2 %
Lymphocytes Relative: 8 %
Lymphs Abs: 1.3 K/uL (ref 0.7–4.0)
MCH: 29.7 pg (ref 26.0–34.0)
MCHC: 32.1 g/dL (ref 30.0–36.0)
MCV: 92.3 fL (ref 80.0–100.0)
Monocytes Absolute: 1.3 K/uL — ABNORMAL HIGH (ref 0.1–1.0)
Monocytes Relative: 8 %
Neutro Abs: 13.4 K/uL — ABNORMAL HIGH (ref 1.7–7.7)
Neutrophils Relative %: 81 %
Platelets: 197 K/uL (ref 150–400)
RBC: 3.91 MIL/uL (ref 3.87–5.11)
RDW: 14.4 % (ref 11.5–15.5)
WBC: 16.4 K/uL — ABNORMAL HIGH (ref 4.0–10.5)
nRBC: 0 % (ref 0.0–0.2)

## 2024-07-24 LAB — MAGNESIUM: Magnesium: 2 mg/dL (ref 1.7–2.4)

## 2024-07-24 LAB — COMPREHENSIVE METABOLIC PANEL WITH GFR
ALT: 27 U/L (ref 0–44)
AST: 23 U/L (ref 15–41)
Albumin: 3.2 g/dL — ABNORMAL LOW (ref 3.5–5.0)
Alkaline Phosphatase: 103 U/L (ref 38–126)
Anion gap: 7 (ref 5–15)
BUN: 13 mg/dL (ref 8–23)
CO2: 35 mmol/L — ABNORMAL HIGH (ref 22–32)
Calcium: 8.4 mg/dL — ABNORMAL LOW (ref 8.9–10.3)
Chloride: 99 mmol/L (ref 98–111)
Creatinine, Ser: 0.58 mg/dL (ref 0.44–1.00)
GFR, Estimated: >60 mL/min (ref >60)
Glucose, Bld: 92 mg/dL (ref 70–99)
Potassium: 3.5 mmol/L (ref 3.5–5.1)
Sodium: 141 mmol/L (ref 135–145)
Total Bilirubin: 0.3 mg/dL (ref 0.0–1.2)
Total Protein: 6.5 g/dL (ref 6.5–8.1)

## 2024-07-24 LAB — GLUCOSE, CAPILLARY
Glucose-Capillary: 105 mg/dL — ABNORMAL HIGH (ref 70–99)
Glucose-Capillary: 112 mg/dL — ABNORMAL HIGH (ref 70–99)
Glucose-Capillary: 93 mg/dL (ref 70–99)

## 2024-07-24 LAB — PHOSPHORUS: Phosphorus: 2.6 mg/dL (ref 2.5–4.6)

## 2024-07-24 MED ORDER — PROCHLORPERAZINE MALEATE 10 MG PO TABS: 10.0000 mg | ORAL_TABLET | Freq: Four times a day (QID) | ORAL | 0 refills | Status: DC | PRN

## 2024-07-24 MED ORDER — ZINC OXIDE 40 % EX OINT: TOPICAL_OINTMENT | Freq: Two times a day (BID) | CUTANEOUS | Status: AC

## 2024-07-24 MED ORDER — SULFAMETHOXAZOLE-TRIMETHOPRIM 800-160 MG PO TABS: 1.0000 | ORAL_TABLET | Freq: Two times a day (BID) | ORAL | 0 refills | Status: AC

## 2024-07-24 MED ORDER — PANTOPRAZOLE SODIUM 40 MG PO TBEC: 40.0000 mg | DELAYED_RELEASE_TABLET | Freq: Two times a day (BID) | ORAL | 1 refills | Status: DC

## 2024-07-24 NOTE — Discharge Summary (Signed)
 Physician Discharge Summary   Patient: Alejandra Cruz MRN: [id number] DOB: [birth year]  Admit date:     07/17/2024  Discharge date: 07/24/24  Discharge Physician: Eric Nunnery   PCP: Landy Barnie RAMAN, NP   Recommendations at discharge:  Continue follow-up and cleaning medication list as per hospice service. - Main goal is to maintain comfort and dignity while treating what is treatable. Repeat basic metabolic panel to follow electrolytes and renal function   Discharge Diagnoses: Seizure disorder (HCC) Obesity (BMI 30-39.9) Parkinson disease (HCC) AKI (acute kidney injury) Pressure injury of skin Acute respiratory failure with hypoxia (HCC) Acute metabolic encephalopathy Sepsis with acute renal failure with septic shock at time of admission [facility])  Brief Narrative:    89 y.o. female with medical history significant of past medical history significant for dementia, hypertension, hyperlipidemia, restless leg syndrome, diverticulosis who is resident of skilled nursing facility presented to the emergency department for evaluation of altered mental status, fever, tachycardia, tachypnea.  Patient has large decubitus ulcer which seems to be the source of sepsis.  Initial blood pressure is low, patient's lactic acid is 4.7, white count 26, she has AKI.  Patient will need further management evaluation of septic shock.  Initially started on broad-spectrum antibiotics and also received steroids.   Patient was started on IV vancomycin , cefepime with multiple IV fluid boluses to maintain blood pressure.  Patient had high-grade fever, last night antibiotics changed to vancomycin  and meropenem.  Started on Solu-Medrol  therapy.   Despite of aggressive medical measure patient's condition has not made any meaningful recovery therefore ongoing palliative care discussions to transition patient to comfort care.   Plan would be transition back to her facility with hospice.  Assessment and Plan: Sepsis  with septic shock with multiorgan dysfunction Infected large decubitus ulcer  Proteus bacteremia Initially required pressors now off.  Blood cultures have grown Proteus.  Previous provider has discussed with pharmacy staff and antibiotics were adjusted.  Patient has failed to make any meaningful recovery at this time therefore palliative care team is following.  For now antibiotics are Rocephin and Flagyl.  Will plan for total of 10 days, EOT 10/19.  Transition to p.o. Bactrim upon discharge, to complete antibiotic therapy. - Prognosis remains guarded especially in the setting of advanced dementia and advanced decubitus ulcer.   Acute metabolic encephalopathy -- slowly improving -In the setting of sepsis.  She does have baseline dementia. -Per family members mentation has improved and back to baseline at discharge. -Continue supportive care and constant reorientation.   Hypernatremia -Resolved -Maintain adequate hydration.   Acute hypoxic respiratory failure, improved -Present at time of admission - At discharge 2 L nasal cannula supplementation in place - Continue to wean off oxygen supplementation as tolerated.   -Good saturation on 2 L supplementation and no acute respiratory distress appreciated at discharge.   AKI -- RESOLVED  Hyperkalemia-RESOLVED Admission creatinine 1.8, improved after IV fluids -Repeat basic metabolic panel in 1 week to assess stability.   Hypertension: -Stable vital signs at discharge - Continue to follow stability - Resume home antihypertensive agents as required.   Hyperlipidemia: -Continue statin.   Advanced dementia: Patient is DNR/DNI.  Recommendations are to transition patient to hospice and comfort care; complete to treat what is treatable and follow response.   Hypophosphatemia -- Continue repletion as needed.     Dysphagia -- Seen by speech, recommending dysphagia 2 diet with thin liquids.     Consultants: Hospice Procedures performed:  See below for x-ray  reports. Disposition: Return back to skilled nursing facility with hospice follow-up. Diet recommendation: Dysphagia 2 diet with thin liquids.  DISCHARGE MEDICATION: Allergies as of 07/24/2024       Reactions   Remeron  [mirtazapine ] Other (See Comments)   Unknown reaction, allergy listed on MAR        Medication List     STOP taking these medications    Corn Remover One Step 40 % Pads Generic drug: Salicylic Acid   NON FORMULARY   nystatin 100000 UNIT/ML suspension Commonly known as: MYCOSTATIN   potassium chloride  10 MEQ tablet Commonly known as: KLOR-CON    Venelex Oint       TAKE these medications    acetaminophen  650 MG CR tablet Commonly known as: TYLENOL  Take 650 mg by mouth in the morning, at noon, in the evening, and at bedtime.   ascorbic acid 500 MG tablet Commonly known as: VITAMIN C Take 500 mg by mouth 2 (two) times daily.   aspirin  81 MG chewable tablet Chew 81 mg by mouth daily.   Carbidopa-Levodopa ER 25-100 MG tablet controlled release Commonly known as: SINEMET CR Take 1 tablet by mouth 3 (three) times daily.   Centrum Silver tablet Take 1 tablet by mouth daily.   cholecalciferol  25 MCG (1000 UNIT) tablet Commonly known as: VITAMIN D3 Take 1,000 Units by mouth daily. Special Instructions: nutritional support for wound management Once A Day 09:00 AM   cyanocobalamin  1000 MCG tablet Take 1,000 mcg by mouth daily. Special Instructions: nutritional support for wound management Once A Day 09:00 AM   FLUoxetine  40 MG capsule Commonly known as: PROZAC  Take 40 mg by mouth daily.   levETIRAcetam  250 MG tablet Commonly known as: KEPPRA  Take 250 mg by mouth 2 (two) times daily.   liver oil-zinc oxide 40 % ointment Commonly known as: DESITIN Apply topically 2 (two) times daily.   melatonin 5 MG Tabs Take 5 mg by mouth at bedtime.   pantoprazole  40 MG tablet Commonly known as: Protonix  Take 1 tablet (40 mg  total) by mouth 2 (two) times daily.   prochlorperazine 10 MG tablet Commonly known as: COMPAZINE Take 1 tablet (10 mg total) by mouth every 6 (six) hours as needed for nausea or vomiting.   sennosides-docusate sodium  8.6-50 MG tablet Commonly known as: SENOKOT-S Take 2 tablets by mouth in the morning and at bedtime. For Constipation   sulfamethoxazole -trimethoprim 800-160 MG tablet Commonly known as: Bactrim DS Take 1 tablet by mouth 2 (two) times daily for 2 days.   Vashe Wound 0.033 % Soln Apply 1 Application topically daily.               Discharge Care Instructions  (From admission, onward)           Start     Ordered   07/24/24 0000  Discharge wound care:       Comments: Wound care: 2 times daily -Cleanse sacral wound with Vashe wound cleanser Soila [phone]) do not rinse and allow to air dry. Using a Q tip applicator insert Vashe moistened Kerlix into wound bed 2 times daily, cover with dry gauze and ABD pad or silicone foam whichever is preferred. - Counseled on repositioning (at least every 2 hours) recommended.  Make sure patient maintain clean and dry skin area as much as possible.   07/24/24 1507            Follow-up Information     Landy Barnie RAMAN, NP. Schedule an appointment as soon  as possible for a visit in 10 day(s).   Specialty: Geriatric Medicine Contact information: [address] [place] [address] [phone]                Discharge Exam: Filed Weights   07/17/24 1138 07/17/24 1604  Weight: 94 kg 94 kg   General exam: Appears calm and comfortable, elderly frail, chronically ill-appearing Respiratory system: Clear to auscultation. Respiratory effort normal. Cardiovascular system: S1 & S2 heard, RRR. No JVD, murmurs, rubs, gallops or clicks. No pedal edema. Gastrointestinal system: Abdomen is nondistended, soft and nontender. No organomegaly or masses felt. Normal bowel sounds heard. Central nervous system: Alert and  oriented to name only Extremities: Symmetric 5 x 5 power. Skin: No rashes, lesions or ulcers Psychiatry: Judgement and insight appear poor Coccyx ulcer stage IV   Condition at discharge: Stable and improved.  The results of significant diagnostics from this hospitalization (including imaging, microbiology, ancillary and laboratory) are listed below for reference.   Imaging Studies: DG CHEST PORT 1 VIEW Result Date: 07/21/2024 CLINICAL DATA:  Abnormal lung sounds. EXAM: PORTABLE CHEST 1 VIEW COMPARISON:  07/20/2024 FINDINGS: The cardio pericardial silhouette is enlarged. There is pulmonary vascular congestion without overt pulmonary edema. Diffuse interstitial opacity suggests edema. Retrocardiac atelectasis or infiltrate with small left pleural effusion, similar to prior. Bones are diffusely demineralized. Degenerative changes noted in both shoulders. Telemetry leads overlie the chest. IMPRESSION: 1. Enlargement of the cardiopericardial silhouette with pulmonary vascular congestion and diffuse interstitial opacity suggesting edema. 2. Retrocardiac atelectasis or infiltrate with small left pleural effusion, similar to prior. Electronically Signed   By: Camellia Candle M.D.   On: 07/21/2024 05:43   DG CHEST PORT 1 VIEW Result Date: 07/20/2024 EXAM: 1 VIEW(S) XRAY OF THE CHEST 07/20/2024 03:22:00 PM COMPARISON: Chest x-ray 07/18/2024. CLINICAL HISTORY: Abnormal lung sounds. Per chart: medical history significant of past medical history significant for dementia, hypertension, hyperlipidemia, restless leg syndrome, diverticulosis who is resident of skilled nursing facility presented to the emergency department for evaluation of altered mental status, fever, tachycardia, tachypnea. FINDINGS: LUNGS AND PLEURA: Persistent retrocardiac airspace opacity limited evaluation due to overlying lines. Chronic coarsened interstitial markings. No pulmonary edema. No pleural effusion. No pneumothorax. HEART AND  MEDIASTINUM: Atherosclerotic plaque. No acute abnormality of the cardiac and mediastinal silhouettes. BONES AND SOFT TISSUES: Severe degenerative changes of the left shoulder. No acute osseous abnormality. IMPRESSION: 1. Persistent retrocardiac airspace opacity, limited evaluation due to overlying lines. Recommend further evaluation with chest x-ray PA and lateral view 2. Atherosclerotic vascular calcifications. Electronically signed by: Morgane Naveau MD 07/20/2024 08:28 PM EDT RP Workstation: [id number]   DG CHEST PORT 1 VIEW Result Date: 07/18/2024 CLINICAL DATA:  Sepsis EXAM: PORTABLE CHEST 1 VIEW COMPARISON:  07/17/2024 FINDINGS: Cardiomegaly, vascular congestion. Aortic atherosclerosis. No confluent airspace opacities, effusions or edema. No acute bony abnormality. IMPRESSION: Cardiomegaly, vascular congestion. Electronically Signed   By: Franky Crease M.D.   On: 07/18/2024 20:51   ECHOCARDIOGRAM COMPLETE Result Date: 07/18/2024    ECHOCARDIOGRAM REPORT   Patient Name:   ZAHARAH AMIR Date of Exam: 07/18/2024 Medical Rec #:  [id number]      Height:       68.0 in Accession #:    [id number]     Weight:       207.2 lb Date of Birth:  20-May-1933      BSA:          2.075 m Patient Age:    89 years  BP:           131/85 mmHg Patient Gender: F              HR:           94 bpm. Exam Location:  [facility] Procedure: 2D Echo, Cardiac Doppler and Color Doppler (Both Spectral and Color            Flow Doppler were utilized during procedure). Indications:    Shock R57.9  History:        Patient has no prior history of Echocardiogram examinations.                 Risk Factors:Hypertension.  Sonographer:    Jayson Gaskins Referring Phys: [id number] BRENDA MORRISON IMPRESSIONS  1. Left ventricular ejection fraction, by estimation, is 20 to 25%. The left ventricle has severely decreased function. The left ventricle demonstrates global hypokinesis. Left ventricular diastolic parameters are indeterminate.  2. Right  ventricular systolic function is moderately reduced. The right ventricular size is normal. There is normal pulmonary artery systolic pressure. The estimated right ventricular systolic pressure is 30.4 mmHg.  3. The mitral valve is grossly normal. Mild mitral valve regurgitation.  4. The aortic valve is tricuspid. Aortic valve regurgitation is trivial. Aortic valve sclerosis is present, with no evidence of aortic valve stenosis. Aortic valve mean gradient measures 5.0 mmHg.  5. The inferior vena cava is dilated in size with <50% respiratory variability, suggesting right atrial pressure of 15 mmHg. Comparison(s): No prior Echocardiogram. FINDINGS  Left Ventricle: Left ventricular ejection fraction, by estimation, is 20 to 25%. The left ventricle has severely decreased function. The left ventricle demonstrates global hypokinesis. The left ventricular internal cavity size was normal in size. There is no left ventricular hypertrophy. Left ventricular diastolic parameters are indeterminate. Right Ventricle: The right ventricular size is normal. No increase in right ventricular wall thickness. Right ventricular systolic function is moderately reduced. There is normal pulmonary artery systolic pressure. The tricuspid regurgitant velocity is 1.96 m/s, and with an assumed right atrial pressure of 15 mmHg, the estimated right ventricular systolic pressure is 30.4 mmHg. Left Atrium: Left atrial size was normal in size. Right Atrium: Right atrial size was normal in size. Pericardium: There is no evidence of pericardial effusion. Mitral Valve: The mitral valve is grossly normal. Mild mitral valve regurgitation. Tricuspid Valve: The tricuspid valve is grossly normal. Tricuspid valve regurgitation is trivial. Aortic Valve: The aortic valve is tricuspid. There is mild aortic valve annular calcification. Aortic valve regurgitation is trivial. Aortic valve sclerosis is present, with no evidence of aortic valve stenosis. Aortic valve  mean gradient measures 5.0 mmHg. Aortic valve peak gradient measures 8.1 mmHg. Aortic valve area, by VTI measures 2.05 cm. Pulmonic Valve: The pulmonic valve was not well visualized. Pulmonic valve regurgitation is mild. Aorta: The aortic root is normal in size and structure. Venous: The inferior vena cava is dilated in size with less than 50% respiratory variability, suggesting right atrial pressure of 15 mmHg. IAS/Shunts: No atrial level shunt detected by color flow Doppler. Additional Comments: 3D was performed not requiring image post processing on an independent workstation and was indeterminate.  LEFT VENTRICLE PLAX 2D LVIDd:         5.00 cm LVIDs:         4.50 cm LV PW:         0.80 cm LV IVS:        1.00 cm LVOT diam:     2.00  cm LV SV:         56 LV SV Index:   27 LVOT Area:     3.14 cm  LEFT ATRIUM             Index        RIGHT ATRIUM           Index LA Vol (A2C):   35.6 ml 17.16 ml/m  RA Area:     13.70 cm LA Vol (A4C):   31.5 ml 15.18 ml/m  RA Volume:   31.90 ml  15.37 ml/m LA Biplane Vol: 36.8 ml 17.74 ml/m  AORTIC VALVE AV Area (Vmax):    2.00 cm AV Area (Vmean):   2.17 cm AV Area (VTI):     2.05 cm AV Vmax:           142.00 cm/s AV Vmean:          108.000 cm/s AV VTI:            0.273 m AV Peak Grad:      8.1 mmHg AV Mean Grad:      5.0 mmHg LVOT Vmax:         90.40 cm/s LVOT Vmean:        74.600 cm/s LVOT VTI:          0.178 m LVOT/AV VTI ratio: 0.65  AORTA Ao Root diam: 2.90 cm MITRAL VALVE                TRICUSPID VALVE MV Area (PHT): 9.85 cm     TR Peak grad:   15.4 mmHg MV Decel Time: 77 msec      TR Vmax:        196.00 cm/s MV E velocity: 105.00 cm/s                             SHUNTS                             Systemic VTI:  0.18 m                             Systemic Diam: 2.00 cm Jayson Sierras MD Electronically signed by Jayson Sierras MD Signature Date/Time: 07/18/2024/2:19:17 PM    Final    CT CHEST ABDOMEN PELVIS WO CONTRAST Result Date: 07/17/2024 EXAM: CT CHEST,  ABDOMEN AND PELVIS WITHOUT CONTRAST 07/17/2024 01:22:39 PM TECHNIQUE: CT of the chest, abdomen and pelvis was performed without the administration of intravenous contrast. Multiplanar reformatted images are provided for review. Automated exposure control, iterative reconstruction, and/or weight based adjustment of the mA/kV was utilized to reduce the radiation dose to as low as reasonably achievable. COMPARISON: None available. CLINICAL HISTORY: Sepsis workup. Vomiting. Also has decubitus ulcer at the sacral area. Patient brought in by EMS from [facility] for AMS/lethargy. Per EMS, nursing staff stated patient has a change in her cognition. Wanted her seen for sepsis. Stated she had a temperature of 100.9 and sats in 70s. Patient has a stage 4 decubitus. Patient very heavy mouth breathing, hard to keep still. FINDINGS: CHEST: MEDIASTINUM AND LYMPH NODES: Heart is unremarkable. No pericardial fluid. The central airways are clear. No mediastinal, hilar or axillary lymphadenopathy. LUNGS AND PLEURA: No pneumonia. No pleural fluid. No pneumothorax. ABDOMEN AND PELVIS: LIVER: The liver is unremarkable. GALLBLADDER AND BILE  DUCTS: Gallbladder is unremarkable. No biliary ductal dilatation. SPLEEN: No acute abnormality. PANCREAS: No acute abnormality. ADRENAL GLANDS: No acute abnormality. KIDNEYS, URETERS AND BLADDER: No stones in the kidneys or ureters. No hydronephrosis. No perinephric or periureteral stranding. Bladder collapse around the foley catheter. GI AND BOWEL: Stomach demonstrates no acute abnormality. There is no bowel obstruction. Appendix not identified. REPRODUCTIVE ORGANS: No acute abnormality. PERITONEUM AND RETROPERITONEUM: No ascites. No free air. VASCULATURE: Aorta is normal in caliber. ABDOMINAL AND PELVIS LYMPH NODES: No lymphadenopathy. BONES AND SOFT TISSUES: Bilateral hip prosthetics. Skin ulceration extends to the lower sacrum on image 105 of series 2. No organized fluid collection. No CT  evidence of acute osteomyelitis. IMPRESSION: 1. No acute abnormality of the chest, abdomen, and pelvis. 2. Lower sacrum decubitus ulcer without evidence of abscess or acute osteomyelitis by CT imaging Electronically signed by: Norleen Boxer MD 07/17/2024 01:58 PM EDT RP Workstation: [id number]   CT Head Wo Contrast Result Date: 07/17/2024 EXAM: CT HEAD WITHOUT CONTRAST 07/17/2024 01:22:39 PM TECHNIQUE: CT of the head was performed without the administration of intravenous contrast. Automated exposure control, iterative reconstruction, and/or weight based adjustment of the mA/kV was utilized to reduce the radiation dose to as low as reasonably achievable. COMPARISON: None available. CLINICAL HISTORY: AMS. Eval for bleed or other pathology. Likely metabolic. Patient brought in by [facility] from [facility] for AMS/ lethargy. Per EMS nursing staff, patient has a change in her cognition. Wanted her seen for sepsis. Stated she had a temp of 100.9 and sats in 70s. Patient has a stage 4 decubitus. Patient very heavy mouth breathing, hard to keep still. FINDINGS: BRAIN AND VENTRICLES: No acute hemorrhage. No evidence of acute infarct. No hydrocephalus. No extra-axial collection. No mass effect or midline shift. Dense basal ganglia calcifications are again noted. Periventricular and subcortical white matter hypoattenuation has progressed since the prior exam. This most likely reflects the sequelae of chronic microvascular ischemia. Chronic calcifications are present within the dentate nuclei of the cerebellum bilaterally. ORBITS: Right lens replacement is noted. SINUSES: No acute abnormality. SOFT TISSUES AND SKULL: No acute soft tissue abnormality. No skull fracture. IMPRESSION: 1. No acute intracranial abnormality. 2. Progression of periventricular and subcortical white matter hypoattenuation, likely reflecting sequelae of chronic microvascular ischemia. Electronically signed by: Lonni Necessary MD 07/17/2024  01:29 PM EDT RP Workstation: [id number]   DG Chest Port 1 View Result Date: 07/17/2024 EXAM: 1 VIEW(S) XRAY OF THE CHEST 07/17/2024 12:17:00 PM COMPARISON: None available. CLINICAL HISTORY: Questionable sepsis, altered mental status, lethargy, change in cognition, fever 100.9, sats in 70s, stage 4 decubitus. FINDINGS: LUNGS AND PLEURA: Mild pulmonary edema, improved. No focal pulmonary opacity. No pleural effusion. No pneumothorax. HEART AND MEDIASTINUM: Cardiomegaly. Aortic calcification. BONES AND SOFT TISSUES: Severe degenerative changes in left shoulder. IMPRESSION: 1. Mild pulmonary edema, improved. 2. Cardiomegaly. 3. Aortic atherosclerosis. 4. Severe left shoulder osteoarthritis. Electronically signed by: Dayne Hassell MD 07/17/2024 12:39 PM EDT RP Workstation: [id number]    Microbiology: Results for orders placed or performed during the hospital encounter of 07/17/24  Blood Culture (routine x 2)     Status: Abnormal   Collection Time: 07/17/24 11:54 AM   Specimen: BLOOD  Result Value Ref Range Status   Specimen Description   Final    BLOOD RIGHT ARM Performed at [facility] [place], [address] [place], [place] [address]    Special Requests   Final    BOTTLES DRAWN AEROBIC AND ANAEROBIC Blood Culture adequate volume Performed at [facility],  [address], [place], [place] [address]    Culture  Setup Time   Final    GRAM NEGATIVE RODS IN BOTH AEROBIC AND ANAEROBIC BOTTLES CRITICAL RESULT CALLED TO, READ BACK BY AND VERIFIED WITH: KINDLEY,C ON 07/18/24 AT 0334 BY PURDIE,J CRITICAL RESULT CALLED TO, READ BACK BY AND VERIFIED WITH: RN CATLIN ROGERS [id number] AT 0850 BY EC Performed at [facility], [address] [place], [place] [address]    Culture PROTEUS MIRABILIS PROVIDENCIA STUARTII  (A)  Final   Report Status 07/23/2024 FINAL  Final   Organism ID, Bacteria PROTEUS MIRABILIS  Final   Organism ID, Bacteria PROVIDENCIA STUARTII  Final      Susceptibility   Proteus  mirabilis - MIC*    AMPICILLIN <=2 SENSITIVE Sensitive     CEFAZOLIN  (NON-URINE) 4 INTERMEDIATE Intermediate     CEFEPIME <=0.12 SENSITIVE Sensitive     ERTAPENEM <=0.12 SENSITIVE Sensitive     CEFTRIAXONE <=0.25 SENSITIVE Sensitive     CIPROFLOXACIN 2 RESISTANT Resistant     GENTAMICIN <=1 SENSITIVE Sensitive     MEROPENEM 1 SENSITIVE Sensitive     TRIMETH/SULFA <=20 SENSITIVE Sensitive     AMPICILLIN/SULBACTAM <=2 SENSITIVE Sensitive     PIP/TAZO Value in next row Sensitive      <=4 SENSITIVEThis is a modified FDA-approved test that has been validated and its performance characteristics determined by the reporting laboratory.  This laboratory is certified under the Clinical Laboratory Improvement Amendments CLIA as qualified to perform high complexity clinical laboratory testing.    * PROTEUS MIRABILIS   Providencia stuartii - MIC*    AMPICILLIN Value in next row Resistant      <=4 SENSITIVEThis is a modified FDA-approved test that has been validated and its performance characteristics determined by the reporting laboratory.  This laboratory is certified under the Clinical Laboratory Improvement Amendments CLIA as qualified to perform high complexity clinical laboratory testing.    CEFEPIME Value in next row Sensitive      <=4 SENSITIVEThis is a modified FDA-approved test that has been validated and its performance characteristics determined by the reporting laboratory.  This laboratory is certified under the Clinical Laboratory Improvement Amendments CLIA as qualified to perform high complexity clinical laboratory testing.    ERTAPENEM Value in next row Sensitive      <=4 SENSITIVEThis is a modified FDA-approved test that has been validated and its performance characteristics determined by the reporting laboratory.  This laboratory is certified under the Clinical Laboratory Improvement Amendments CLIA as qualified to perform high complexity clinical laboratory testing.    CEFTRIAXONE Value in  next row Sensitive      <=4 SENSITIVEThis is a modified FDA-approved test that has been validated and its performance characteristics determined by the reporting laboratory.  This laboratory is certified under the Clinical Laboratory Improvement Amendments CLIA as qualified to perform high complexity clinical laboratory testing.    CIPROFLOXACIN Value in next row Sensitive      <=4 SENSITIVEThis is a modified FDA-approved test that has been validated and its performance characteristics determined by the reporting laboratory.  This laboratory is certified under the Clinical Laboratory Improvement Amendments CLIA as qualified to perform high complexity clinical laboratory testing.    GENTAMICIN Value in next row Resistant      <=4 SENSITIVEThis is a modified FDA-approved test that has been validated and its performance characteristics determined by the reporting laboratory.  This laboratory is certified under the Clinical Laboratory Improvement Amendments CLIA as qualified to  perform high complexity clinical laboratory testing.    MEROPENEM Value in next row Sensitive      <=4 SENSITIVEThis is a modified FDA-approved test that has been validated and its performance characteristics determined by the reporting laboratory.  This laboratory is certified under the Clinical Laboratory Improvement Amendments CLIA as qualified to perform high complexity clinical laboratory testing.    TRIMETH/SULFA Value in next row Sensitive      <=4 SENSITIVEThis is a modified FDA-approved test that has been validated and its performance characteristics determined by the reporting laboratory.  This laboratory is certified under the Clinical Laboratory Improvement Amendments CLIA as qualified to perform high complexity clinical laboratory testing.    AMPICILLIN/SULBACTAM Value in next row Sensitive      <=4 SENSITIVEThis is a modified FDA-approved test that has been validated and its performance characteristics determined by the  reporting laboratory.  This laboratory is certified under the Clinical Laboratory Improvement Amendments CLIA as qualified to perform high complexity clinical laboratory testing.    PIP/TAZO Value in next row Sensitive      <=4 SENSITIVEThis is a modified FDA-approved test that has been validated and its performance characteristics determined by the reporting laboratory.  This laboratory is certified under the Clinical Laboratory Improvement Amendments CLIA as qualified to perform high complexity clinical laboratory testing.    * PROVIDENCIA STUARTII  Blood Culture ID Panel (Reflexed)     Status: Abnormal   Collection Time: 07/17/24 11:54 AM  Result Value Ref Range Status   Enterococcus faecalis NOT DETECTED NOT DETECTED Final   Enterococcus Faecium NOT DETECTED NOT DETECTED Final   Listeria monocytogenes NOT DETECTED NOT DETECTED Final   Staphylococcus species NOT DETECTED NOT DETECTED Final   Staphylococcus aureus (BCID) NOT DETECTED NOT DETECTED Final   Staphylococcus epidermidis NOT DETECTED NOT DETECTED Final   Staphylococcus lugdunensis NOT DETECTED NOT DETECTED Final   Streptococcus species NOT DETECTED NOT DETECTED Final   Streptococcus agalactiae NOT DETECTED NOT DETECTED Final   Streptococcus pneumoniae NOT DETECTED NOT DETECTED Final   Streptococcus pyogenes NOT DETECTED NOT DETECTED Final   A.calcoaceticus-baumannii NOT DETECTED NOT DETECTED Final   Bacteroides fragilis NOT DETECTED NOT DETECTED Final   Enterobacterales DETECTED (A) NOT DETECTED Final    Comment: Enterobacterales represent a large order of gram negative bacteria, not a single organism. CRITICAL RESULT CALLED TO, READ BACK BY AND VERIFIED WITH: RN EDMONIA GOSLING [id number] AT 0850 BY EC    Enterobacter cloacae complex NOT DETECTED NOT DETECTED Final   Escherichia coli NOT DETECTED NOT DETECTED Final   Klebsiella aerogenes NOT DETECTED NOT DETECTED Final   Klebsiella oxytoca NOT DETECTED NOT DETECTED Final    Klebsiella pneumoniae NOT DETECTED NOT DETECTED Final   Proteus species DETECTED (A) NOT DETECTED Final    Comment: CRITICAL RESULT CALLED TO, READ BACK BY AND VERIFIED WITH: RN CATLIN ROGERS [id number] AT 0850 BY EC    Salmonella species NOT DETECTED NOT DETECTED Final   Serratia marcescens NOT DETECTED NOT DETECTED Final   Haemophilus influenzae NOT DETECTED NOT DETECTED Final   Neisseria meningitidis NOT DETECTED NOT DETECTED Final   Pseudomonas aeruginosa NOT DETECTED NOT DETECTED Final   Stenotrophomonas maltophilia NOT DETECTED NOT DETECTED Final   Candida albicans NOT DETECTED NOT DETECTED Final   Candida auris NOT DETECTED NOT DETECTED Final   Candida glabrata NOT DETECTED NOT DETECTED Final   Candida krusei NOT DETECTED NOT DETECTED Final   Candida parapsilosis NOT DETECTED NOT DETECTED Final   Candida  tropicalis NOT DETECTED NOT DETECTED Final   Cryptococcus neoformans/gattii NOT DETECTED NOT DETECTED Final   CTX-M ESBL NOT DETECTED NOT DETECTED Final   Carbapenem resistance IMP NOT DETECTED NOT DETECTED Final   Carbapenem resistance KPC NOT DETECTED NOT DETECTED Final   Carbapenem resistance NDM NOT DETECTED NOT DETECTED Final   Carbapenem resist OXA 48 LIKE NOT DETECTED NOT DETECTED Final   Carbapenem resistance VIM NOT DETECTED NOT DETECTED Final    Comment: Performed at [facility], [address] [place], [place] [address]  Blood Culture (routine x 2)     Status: Abnormal   Collection Time: 07/17/24 11:59 AM   Specimen: BLOOD  Result Value Ref Range Status   Specimen Description   Final    BLOOD LEFT ARM Performed at [facility], [address], [place], [place] [address]    Special Requests   Final    BOTTLES DRAWN AEROBIC AND ANAEROBIC Blood Culture results may not be optimal due to an inadequate volume of blood received in culture bottles Performed at [facility], [address], [place], [place] [address]    Culture  Setup Time   Final    GRAM NEGATIVE  RODS IN BOTH AEROBIC AND ANAEROBIC BOTTLES CRITICAL RESULT CALLED TO, READ BACK BY AND VERIFIED WITH: KINDLEY,C ON 07/18/24 AT 0334 BY PURDIE,J    Culture (A)  Final    PROTEUS MIRABILIS SUSCEPTIBILITIES PERFORMED ON PREVIOUS CULTURE WITHIN THE LAST 5 DAYS. Performed at [facility], [address], [place], [place] [address]    Report Status 07/21/2024 FINAL  Final  MRSA Next Gen by PCR, Nasal     Status: None   Collection Time: 07/17/24  4:06 PM   Specimen: Nasal Mucosa; Nasal Swab  Result Value Ref Range Status   MRSA by PCR Next Gen NOT DETECTED NOT DETECTED Final    Comment: (NOTE) The GeneXpert MRSA Assay (FDA approved for NASAL specimens only), is one component of a comprehensive MRSA colonization surveillance program. It is not intended to diagnose MRSA infection nor to guide or monitor treatment for MRSA infections. Test performance is not FDA approved in patients less than 27 years old. Performed at [facility], [address] [place], [place] [address]   Respiratory (~20 pathogens) panel by PCR     Status: None   Collection Time: 07/17/24 11:32 PM   Specimen: Nasopharyngeal Swab; Respiratory  Result Value Ref Range Status   Adenovirus NOT DETECTED NOT DETECTED Final   Coronavirus 229E NOT DETECTED NOT DETECTED Final    Comment: (NOTE) The Coronavirus on the Respiratory Panel, DOES NOT test for the novel  Coronavirus (2019 nCoV)    Coronavirus HKU1 NOT DETECTED NOT DETECTED Final   Coronavirus NL63 NOT DETECTED NOT DETECTED Final   Coronavirus OC43 NOT DETECTED NOT DETECTED Final   Metapneumovirus NOT DETECTED NOT DETECTED Final   Rhinovirus / Enterovirus NOT DETECTED NOT DETECTED Final   Influenza A NOT DETECTED NOT DETECTED Final   Influenza B NOT DETECTED NOT DETECTED Final   Parainfluenza Virus 1 NOT DETECTED NOT DETECTED Final   Parainfluenza Virus 2 NOT DETECTED NOT DETECTED Final   Parainfluenza Virus 3 NOT DETECTED NOT DETECTED Final    Parainfluenza Virus 4 NOT DETECTED NOT DETECTED Final   Respiratory Syncytial Virus NOT DETECTED NOT DETECTED Final   Bordetella pertussis NOT DETECTED NOT DETECTED Final   Bordetella Parapertussis NOT DETECTED NOT DETECTED Final   Chlamydophila pneumoniae NOT DETECTED NOT DETECTED Final  Mycoplasma pneumoniae NOT DETECTED NOT DETECTED Final    Comment: Performed at [facility], [address], [place], [place] [address]  C Difficile Quick Screen w PCR reflex     Status: Abnormal   Collection Time: 07/18/24  7:27 PM   Specimen: STOOL  Result Value Ref Range Status   C Diff antigen POSITIVE (A) NEGATIVE Final   C Diff toxin NEGATIVE NEGATIVE Final   C Diff interpretation Results are indeterminate. See PCR results.  Final    Comment: Performed at [facility], [address], [place], [place] [address]  C. Diff by PCR, Reflexed     Status: None   Collection Time: 07/18/24  7:27 PM  Result Value Ref Range Status   Toxigenic C. Difficile by PCR NEGATIVE NEGATIVE Final    Comment: Patient is colonized with non toxigenic C. difficile. May not need treatment unless significant symptoms are present.   Hypervirulent Strain PRESUMPTIVE NEGATIVE PRESUMPTIVE NEGATIVE Final    Comment: Performed at [facility], [address] [place], [place] [address]    Labs: CBC: Recent Labs  Lab 07/20/24 0421 07/21/24 0437 07/22/24 0441 07/23/24 0425 07/24/24 0420  WBC 33.8* 24.3* 19.9* 18.0* 16.4*  NEUTROABS 30.0* 21.4* 14.8* 13.4* 13.4*  HGB 10.6* 10.7* 11.1* 10.7* 11.6*  HCT 32.8* 32.9* 34.0* 33.5* 36.1  MCV 93.4 93.2 92.1 92.5 92.3  PLT 116* 103* 123* 150 197   Basic Metabolic Panel: Recent Labs  Lab 07/18/24 0301 07/19/24 0317 07/20/24 0421 07/21/24 0437 07/22/24 0441 07/23/24 0425 07/24/24 0420  NA 143   < > 146* 148* 142 140 141  K 4.4   < > 3.6 3.6 3.2* 3.3* 3.5  CL 106   < > 110 106 100 98 99  CO2 22   < > 28 32 35* 36* 35*  GLUCOSE 129*   < > 127* 106* 97 75 92   BUN 47*   < > 27* 16 11 11 13   CREATININE 1.88*   < > 0.81 0.56 0.64 0.54 0.58  CALCIUM  8.0*   < > 7.8* 7.8* 7.8* 7.8* 8.4*  MG 2.1  --  2.3  --  1.9 1.9 2.0  PHOS 3.9  --  1.1* 1.8* 2.5 2.8 2.6   < > = values in this interval not displayed.   Liver Function Tests: Recent Labs  Lab 07/20/24 0421 07/21/24 0437 07/22/24 0441 07/23/24 0425 07/24/24 0420  AST 38 28 19 17 23   ALT 60* 50* 36 27 27  ALKPHOS 190* 182* 130* 107 103  BILITOT 0.5 0.4 0.4 0.3 0.3  PROT 5.9* 6.0* 5.8* 5.8* 6.5  ALBUMIN 3.0* 3.0* 2.9* 2.9* 3.2*   CBG: Recent Labs  Lab 07/23/24 1107 07/23/24 1605 07/24/24 0029 07/24/24 0731 07/24/24 1122  GLUCAP 116* 113* 112* 105* 93    Discharge time spent:  35 minutes.  Signed: Eric Nunnery, MD [facility] 07/24/2024

## 2024-07-24 NOTE — Care Management Important Message (Signed)
 Important Message  Patient Details  Name: Alejandra Cruz MRN: [id number] Date of Birth: [birth year]   Important Message Given:  Yes - Medicare IM     Jailynn Lavalais L Kurstyn Larios 07/24/2024, 3:44 PM

## 2024-07-24 NOTE — Progress Notes (Signed)
 SLP Cancellation Note  Patient Details Name: Alejandra Cruz MRN: [id number] DOB: Nov 24, 1932   Cancelled treatment:        Pt will be discharging back to her facility with Hospice. Thank you for allowing us  to participate in the care of this patient, our service will sign off at this time, thank you.  Leno Mathes H. Clois KILLIAN, CCC-SLP Speech Language Pathologist    Raguel VEAR Clois 07/24/2024, 1:47 PM

## 2024-07-24 NOTE — TOC Transition Note (Signed)
 Transition of Care [facility]) - Discharge Note   Patient Details  Name: Alejandra Cruz MRN: [id number] Date of Birth: 1933-04-09  Transition of Care [facility] CM/SW Contact:  Hoy DELENA Bigness, LCSW Phone Number: 07/24/2024, 3:11 PM   Clinical Narrative:    Pt to return to LTC at [facility] with hospice provided by [facility]. [facility] to meet with pt at 5pm today when she returns to facility. RN to call report to [phone].    Final next level of care: Long Term Nursing Home (w/ Hospice) Barriers to Discharge: Barriers Resolved   Patient Goals and CMS Choice Patient states their goals for this hospitalization and ongoing recovery are:: Return to [facility] CMS Medicare.gov Compare Post Acute Care list provided to:: Patient Represenative (must comment) Choice offered to / list presented to : Adult Children [place] ownership interest in [facility].provided to:: Adult Children    Discharge Placement                  Name of family member notified: Son and daughter Patient and family notified of of transfer: 07/24/24  Discharge Plan and Services Additional resources added to the After Visit Summary for   In-house Referral: Clinical Social Work, Hospice / Palliative Care Discharge Planning Services: NA Post Acute Care Choice: Hospice          DME Arranged: N/A DME Agency: NA                  Social Drivers of Health (SDOH) Interventions SDOH Screenings   Food Insecurity: Patient Unable To Answer (07/18/2024)  Housing: Patient Unable To Answer (07/18/2024)  Transportation Needs: Patient Unable To Answer (07/18/2024)  Utilities: Patient Unable To Answer (07/18/2024)  Depression (PHQ2-9): Low Risk  (01/31/2023)  Financial Resource Strain: Low Risk  (07/14/2018)  Physical Activity: Insufficiently Active (07/14/2018)  Social Connections: Patient Unable To Answer (07/18/2024)  Stress: Stress Concern Present (07/14/2018)  Tobacco Use: Low Risk  (07/17/2024)      Readmission Risk Interventions    07/22/2024    2:56 PM 07/18/2024   12:58 PM  Readmission Risk Prevention Plan  Transportation Screening Complete Complete  PCP or Specialist Appt within 5-7 Days Complete   PCP or Specialist Appt within 3-5 Days  Complete  Home Care Screening Complete   HRI or Home Care Consult  Complete  Social Work Consult for Recovery Care Planning/Counseling  Complete  Palliative Care Screening  Not Applicable  Medication Review Oceanographer)  Complete

## 2024-07-27 ENCOUNTER — Encounter: Payer: Self-pay | Admitting: Adult Health

## 2024-07-27 ENCOUNTER — Non-Acute Institutional Stay (SKILLED_NURSING_FACILITY): Payer: Self-pay | Admitting: Adult Health

## 2024-07-27 DIAGNOSIS — J410 Simple chronic bronchitis: Secondary | ICD-10-CM

## 2024-07-27 DIAGNOSIS — F01C11 Vascular dementia, severe, with agitation: Secondary | ICD-10-CM

## 2024-07-27 DIAGNOSIS — K5909 Other constipation: Secondary | ICD-10-CM | POA: Diagnosis not present

## 2024-07-27 DIAGNOSIS — G20A2 Parkinson's disease without dyskinesia, with fluctuations: Secondary | ICD-10-CM

## 2024-07-27 DIAGNOSIS — I10 Essential (primary) hypertension: Secondary | ICD-10-CM | POA: Diagnosis not present

## 2024-07-27 DIAGNOSIS — G20A1 Parkinson's disease without dyskinesia, without mention of fluctuations: Secondary | ICD-10-CM

## 2024-07-27 DIAGNOSIS — G40909 Epilepsy, unspecified, not intractable, without status epilepticus: Secondary | ICD-10-CM

## 2024-07-27 DIAGNOSIS — K219 Gastro-esophageal reflux disease without esophagitis: Secondary | ICD-10-CM

## 2024-07-27 DIAGNOSIS — F0280 Dementia in other diseases classified elsewhere without behavioral disturbance: Secondary | ICD-10-CM

## 2024-07-27 NOTE — Progress Notes (Unsigned)
 Location:  [facility] Room Number: 116 Place of Service:  SNF (31)   CODE STATUS: dnr  Allergies  Allergen Reactions  . Remeron  [Mirtazapine ] Other (See Comments)    Unknown reaction, allergy listed on [facility]    Chief Complaint  Patient presents with  . Hospitalization Follow-up    HPI:    Past Medical History:  Diagnosis Date  . Diverticulosis OCT 2010 TCS   SIGMOID COLON  . Esophageal web 10/08/2008   DILATION 16 MM  . Gastritis, Helicobacter pylori 10/08/2008   Rx. ABO for 10 days   . GERD (gastroesophageal reflux disease)   . Hip fracture, left (HCC)    2016  . Hyperlipidemia   . Hypertension   . Restless legs syndrome     Past Surgical History:  Procedure Laterality Date  . ABDOMINAL HYSTERECTOMY    . COLONOSCOPY  OCT 2010   TORTUOUS, Sml IH, Tics, MULTIPLE SIMPLE ADENOMAS (<6MM)  . HIP ARTHROPLASTY Left 11/19/2014   Procedure: LEFT PARTIAL HIP REPLACEMENT;  Surgeon: Taft FORBES Minerva, MD;  Location: [facility];  Service: Orthopedics;  Laterality: Left;  . ORIF HIP FRACTURE  04/05/2012   Procedure: OPEN REDUCTION INTERNAL FIXATION HIP;  Surgeon: Taft FORBES Minerva, MD;  Location: [facility];  Service: Orthopedics;  Laterality: Right;  . UPPER GASTROINTESTINAL ENDOSCOPY  AUG 2010   SAVARY  . VESICOVAGINAL FISTULA CLOSURE W/ TAH  1966   bleeding     Social History   Socioeconomic History  . Marital status: Widowed    Spouse name: Not on file  . Number of children: Not on file  . Years of education: Not on file  . Highest education level: Not on file  Occupational History  . Occupation: custodian / Health and safety inspector -[employer]   . Occupation: retired   Tobacco Use  . Smoking status: Never  . Smokeless tobacco: Never  Vaping Use  . Vaping status: Never Used  Substance and Sexual Activity  . Alcohol use: No  . Drug use: No  . Sexual activity: Not Currently  Other Topics Concern  . Not on file  Social History Narrative   [facility] resident of [facility]    Social Drivers of Health   Financial Resource Strain: Low Risk  (07/14/2018)   Overall Financial Resource Strain (CARDIA)   . Difficulty of Paying Living Expenses: Not hard at all  Food Insecurity: Patient Unable To Answer (07/18/2024)   Hunger Vital Sign   . Worried About Programme researcher, broadcasting/film/video in the Last Year: Patient unable to answer   . Ran Out of Food in the Last Year: Patient unable to answer  Transportation Needs: Patient Unable To Answer (07/18/2024)   PRAPARE - Transportation   . Lack of Transportation (Medical): Patient unable to answer   . Lack of Transportation (Non-Medical): Patient unable to answer  Physical Activity: Insufficiently Active (07/14/2018)   Exercise Vital Sign   . Days of Exercise per Week: 4 days   . Minutes of Exercise per Session: 20 min  Stress: Stress Concern Present (07/14/2018)   [employer] of Occupational Health - Occupational Stress Questionnaire   . Feeling of Stress : To some extent  Social Connections: Patient Unable To Answer (07/18/2024)   Social Connection and Isolation Panel   . Frequency of Communication with Friends and Family: Patient unable to answer   . Frequency of Social Gatherings with Friends and Family: Patient unable to answer   . Attends Religious Services: Patient  unable to answer   . Active Member of Clubs or Organizations: Patient unable to answer   . Attends Banker Meetings: Patient unable to answer   . Marital Status: Patient unable to answer  Intimate Partner Violence: Patient Unable To Answer (07/18/2024)   Humiliation, Afraid, Rape, and Kick questionnaire   . Fear of Current or Ex-Partner: Patient unable to answer   . Emotionally Abused: Patient unable to answer   . Physically Abused: Patient unable to answer   . Sexually Abused: Patient unable to answer   Family History  Problem Relation Age of Onset  . Cancer Mother        bladder   . Diabetes Mother   . Diabetes Father    . Emphysema Father   . GER disease Brother   . Colon cancer Neg Hx   . Colon polyps Neg Hx       VITAL SIGNS BP (!) 138/58   Pulse 75   Temp (!) 97.1 F (36.2 C)   Resp 18   Ht 5' 8 (1.727 m)   Wt 206 lb 6.4 oz (93.6 kg)   SpO2 93%   BMI 31.38 kg/m   Outpatient Encounter Medications as of 07/27/2024  Medication Sig  . omeprazole  (PRILOSEC) 20 MG capsule Take 20 mg by mouth 2 (two) times daily before a meal.  . acetaminophen  (TYLENOL ) 650 MG CR tablet Take 650 mg by mouth in the morning, at noon, in the evening, and at bedtime.  . aspirin  81 MG chewable tablet Chew 81 mg by mouth daily.  . Carbidopa-Levodopa ER (SINEMET CR) 25-100 MG tablet controlled release Take 1 tablet by mouth 3 (three) times daily.  . FLUoxetine  (PROZAC ) 40 MG capsule Take 40 mg by mouth daily.  . levETIRAcetam  (KEPPRA ) 250 MG tablet Take 250 mg by mouth 2 (two) times daily.  SABRA liver oil-zinc oxide (DESITIN) 40 % ointment Apply topically 2 (two) times daily.  . Melatonin 5 MG TABS Take 5 mg by mouth at bedtime.  . sennosides-docusate sodium  (SENOKOT-S) 8.6-50 MG tablet Take 2 tablets by mouth in the morning and at bedtime. For Constipation  . Wound Cleansers (VASHE WOUND) 0.033 % SOLN Apply 1 Application topically daily.   No facility-administered encounter medications on file as of 07/27/2024.     SIGNIFICANT DIAGNOSTIC EXAMS  LABS REVIEWED PREVIOUS    10-04-23: glucose 82; bun 21; creat 0.52; k+ 3.8; na++ 137; ca 8.5; gfr >60 10-21-23: wbc 4.5; hgb 12.6; hct 38.8; mcv 92.2 plt 178; glucose 79; bun 22; creat 0.55; k+ 4.1; na++ 137; ca 8.4 gfr >60 protein 6.5 albumin 3.4 tsh 1.873; keppra  8.6   04-27-24: wbc 5.2; hgb 13.4; hct 41.6; mcv 93.5 plt 180; glucose 81; bun 32; creat 0.56; k+ 3.8; na++ 141; ca 8.6; gfr >60 protein 7.2 albumin 3.6  TODAY  07-09-24: wbc 6.6; hgb 12.4; hct 39.3 mcv 95.3 plt 234; glucose 82; bun 27; creat 0.55; k+ 3.9 na++ 147; ca 8.5 gfr >60; protein 6.8 albumin  3.4 07-17-24: wbc 26.1; hgb 12.1; hct 37.6; mcv 93.8 plt 217; glucose 106; bun 42; creat 2.22; k+ 5.2; na++ 137; ca 9.1 gfr 21; protein 7.1 albumin 3.5 blood culture: proteus mirabilis 07-20-24: pre-albumin 10 07-24-24: wbc 16.4; hgb 11.6; hct 36.1 mcv 92.3 plt 197; glucose 92; bun 13; creat 0.58; k+ 3.5; na++ 141; ca 8.4; gfr >60 protein 6.5 albumin 3.2   Review of Systems  Unable to perform ROS: Dementia    Physical Exam  Constitutional:      General: She is not in acute distress.    Appearance: She is well-developed. She is obese. She is not diaphoretic.  Neck:     Thyroid : No thyromegaly.  Cardiovascular:     Rate and Rhythm: Normal rate and regular rhythm.     Heart sounds: Normal heart sounds.  Pulmonary:     Effort: Pulmonary effort is normal. No respiratory distress.     Breath sounds: Normal breath sounds.  Abdominal:     General: Bowel sounds are normal. There is no distension.     Palpations: Abdomen is soft.     Tenderness: There is no abdominal tenderness.  Musculoskeletal:     Cervical back: Neck supple.     Right lower leg: No edema.     Left lower leg: No edema.  Lymphadenopathy:     Cervical: No cervical adenopathy.  Skin:    General: Skin is warm and dry.     Comments: Dressing to wound is dry and intact   Neurological:     Mental Status: She is alert. Mental status is at baseline.  Psychiatric:        Mood and Affect: Mood normal.          ASSESSMENT/ PLAN:  TODAY:   Dementia with agitation: weight is 206 pounds; is presently off medications;   2. Simple chronic bronchitis: without flares: will continue to monitor her status; is not on advair as she is unable to use this inhaler.   3. Essential hypertension: b/p 138/58 is presently off medications.   4. Chronic constipation: will continue senna s twice daily   5. Hypokalemia: k+ 3.5; will monitor  6. Hallucination and delusion/agitation due to dementia: her mood state is stable no longer on  rexulti  7. Unspecified hyperlipidemia: ldl 127 is off statins due to advanced age.   8. Unspecified obsessive compulsive disorder: will continue prozac  40 mg daily (failed wean X 2)   9. Morbid obesity: BMI 31.38 no significant change in weight  10. Parkinson's disease: is without change will continue sinemet cr 25/100 mg three times daily   11. Chronic pain syndrome: will continue tylenol  650 mg four times daily   12. Seizure disorder: (after [facility]I) no recent activity: will continue keppra  250 mg twice daily   13. Thrombocytopenia: plt 197          Barnie Seip NP [facility]  call [phone]

## 2024-07-27 NOTE — Progress Notes (Signed)
 This encounter was created in error - please disregard.

## 2024-07-28 ENCOUNTER — Non-Acute Institutional Stay (SKILLED_NURSING_FACILITY): Payer: Self-pay | Admitting: Internal Medicine

## 2024-07-28 ENCOUNTER — Encounter: Payer: Self-pay | Admitting: Internal Medicine

## 2024-07-28 DIAGNOSIS — R627 Adult failure to thrive: Secondary | ICD-10-CM | POA: Diagnosis not present

## 2024-07-28 DIAGNOSIS — E440 Moderate protein-calorie malnutrition: Secondary | ICD-10-CM | POA: Diagnosis not present

## 2024-07-28 DIAGNOSIS — R6520 Severe sepsis without septic shock: Secondary | ICD-10-CM

## 2024-07-28 DIAGNOSIS — N179 Acute kidney failure, unspecified: Secondary | ICD-10-CM

## 2024-07-28 DIAGNOSIS — F01C11 Vascular dementia, severe, with agitation: Secondary | ICD-10-CM

## 2024-07-28 DIAGNOSIS — A419 Sepsis, unspecified organism: Secondary | ICD-10-CM | POA: Diagnosis not present

## 2024-07-28 DIAGNOSIS — G9341 Metabolic encephalopathy: Secondary | ICD-10-CM

## 2024-07-28 NOTE — Assessment & Plan Note (Signed)
 While hospitalized with presumed sepsis syndrome; albumin ranging 2.9 up to final value of 3.2.  Total protein values were 5.8 up to 30 low normal value of 6.5. Nutritional intake has been compromised by her edentulous state.  This was exacerbated by her dentures which she no longer uses.

## 2024-07-28 NOTE — Assessment & Plan Note (Signed)
 She initially was verbally interactive with monosyllabic answers.  Review of systems was essentially negative.  She had no comprehension of why she had been hospitalized.  Subsequently she became nonverbal without clinical signs of seizure.  This has been a pattern repeatedly noted in the past.

## 2024-07-28 NOTE — Assessment & Plan Note (Signed)
 Initially she was interactive with monosyllabic responses but then began to stare blankly and became nonverbal.  There was no seizure activity noted.

## 2024-07-28 NOTE — Patient Instructions (Signed)
 See assessment and plan under each diagnosis in the problem list and acutely for this visit

## 2024-07-28 NOTE — Progress Notes (Unsigned)
 NURSING HOME LOCATION:  [facility] ROOM NUMBER:  [id number]  CODE STATUS:  DNR  PCP: Landy Barnie RAMAN, NP   This is a nursing facility follow up visit for Nursing Facility readmission within 30 days.  Interim medical record and care since last SNF visit was updated with review of diagnostic studies and change in clinical status since last visit were documented.  HPI: She was hospitalized 10/10 - 07/24/2024 with altered mental status in the context of fever, tachycardia, and tachypnea.  Large decubitus ulcer was felt to be the etiology of sepsis.  Initially blood pressures were soft.  White count was 26,100 &  lactic acid 4.7.  Peak creatinine was 2.25 and nadir eGFR 20 indicating AKI.  CT of the head revealed no acute changes but did reveal chronic microvascular ischemic changes in the periventricular and subcortical white matter with progression compared to prior imaging.  Chronic calcifications in the cerebellum bilaterally were also noted.  No imaging of the pelvis or sacrum was completed to assess possible osteomyelitis. Broad-spectrum antibiotics including vancomycin  and cefepime were initiated along with parenteral steroids.  IV fluid boluses were necessary to maintain adequate blood pressure. High-grade fever persisted and antibiotics were changed to vancomycin  and meropenem. White count peaked at 37,500 ; with antibiotic therapy the final value was 16,400.  Her normal white counts are typically less than 7000.  Normochromic, normocytic anemia was relatively stable with H/H ranging from 10.3/35.0  to 11.6/36.1.  Final creatinine was 0.58 with eGFR greater than 60 indicating CKD stage II. Protein/caloric malnutrition was suggested with albumin ranging from 2.9 up to final value at 3.2.  Total protein ranged from 5.8 up to final low normal value of 6.5. Despite aggressive therapies there was no meaningful clinical recovery.  Palliative care discussions were held with the  family with transition to comfort care.  She was to be transferred back to the SNF with Hospice supervision.  Review of systems: Dementia invalidated responses.  She stated that she was doing pretty good.  She denied any pain with special reference to any symptoms in the sacral area.Wound Care Nurse states she cries out in pain in the sacral area when mobilized in bed.  Constitutional: No fever, significant weight change, fatigue  Eyes: No redness, discharge, pain, vision change ENT/mouth: No nasal congestion,  purulent discharge, earache, change in hearing, sore throat  Cardiovascular: No chest pain, palpitations, paroxysmal nocturnal dyspnea  Respiratory: No cough, sputum production, hemoptysis, DOE, significant snoring, apnea   Gastrointestinal: No heartburn, dysphagia, abdominal pain, nausea /vomiting, rectal bleeding, melena, change in bowels Genitourinary: No dysuria, hematuria, pyuria, incontinence, nocturia Musculoskeletal: No joint stiffness, joint swelling, weakness, pain Dermatologic: No rash, pruritus, change in appearance of skin Neurologic: No dizziness, headache, syncope, seizures, numbness, tingling Psychiatric: No significant anxiety, depression, insomnia, anorexia Endocrine: No change in hair/skin/nails, excessive thirst, excessive hunger, excessive urination  Hematologic/lymphatic: No significant bruising, lymphadenopathy, abnormal bleeding Allergy/immunology: No itchy/watery eyes, significant sneezing, urticaria, angioedema  Physical exam:  Pertinent or positive findings: Facies are blank.  She exhibits repetitive mastication activity of the mandible.  She is edentulous.  Speech pattern exhibits dysarthria.  Heart rate is slow and the rhythm is slightly irregular.  Heart sounds are distant.  Breath sounds are decreased.  Abdomen is protuberant.  Pedal pulses are decreased.  The lower extremities are in cushioned booties.  She has contractures of the toes.  She also has  flexion contractures of the 3rd, 4th and 5th left  fingers which I could not straighten with gentle pressure. After her initial responses; she tended to become nonverbal, staring blankly ahead.  General appearance:  no acute distress, increased work of breathing is present.   Lymphatic: No lymphadenopathy about the head, neck, axilla. Eyes: No conjunctival inflammation or lid edema is present. There is no scleral icterus. Ears:  External ear exam shows no significant lesions or deformities.   Nose:  External nasal examination shows no deformity or inflammation. Nasal mucosa are pink and moist without lesions, exudates Neck:  No thyromegaly, masses, tenderness noted.    Heart:  No gallop, murmur, click, rub .  Lungs:  without wheezes, rhonchi, rales, rubs. Abdomen: Bowel sounds are normal. Abdomen is soft and nontender with no organomegaly, hernias, masses. GU: Deferred  Extremities:  No cyanosis, clubbing, edema  Neurologic exam :Strength equal  in upper & lower extremities & balance, Rhomberg, finger to nose testing could not be completed due to clinical state Skin: Warm & dry w/o tenting. No significant visible lesions or rash.  See summary under each active problem in the Problem List with associated updated therapeutic plan :  Acute metabolic encephalopathy Initially she was interactive with monosyllabic responses but then began to stare blankly and became nonverbal.  There was no seizure activity noted.  Sepsis with acute renal failure without septic shock (HCC) AKI was in the context of soft blood pressures.  Final creatinine 0.58 with a GFR greater than 60 indicating CKD stage II.  Final white count 16,400.  At this time she remains afebrile.  Unspecified protein-calorie malnutrition While hospitalized with presumed sepsis syndrome; albumin ranged from 2.9 up to final value of 3.2.  Total protein values were 5.8 up to a low normal value of 6.5. Nutritional intake has been  compromised by her edentulous state.  This was exacerbated by her dentures which she no longer uses.  Severe vascular dementia with agitation (HCC) She initially was verbally interactive with monosyllabic answers.  Review of systems was essentially negative.  She had no comprehension of why she had been hospitalized.  Subsequently she became nonverbal without clinical signs of seizure.  This has been a pattern repeatedly noted in the past.  Adult failure to thrive syndrome Hospice involvement is appropriate.

## 2024-07-28 NOTE — Assessment & Plan Note (Signed)
 Hospice involvement is appropriate.

## 2024-07-28 NOTE — Assessment & Plan Note (Signed)
 AKI was in the context of soft blood pressures.  Final creatinine 0.58 with a GFR greater than 60 indicating CKD stage II.  Final white count 16,400.  At this time she remains afebrile.

## 2024-07-30 ENCOUNTER — Other Ambulatory Visit: Payer: Self-pay | Admitting: Adult Health

## 2024-07-30 MED ORDER — TRAMADOL HCL 50 MG PO TABS: 50.0000 mg | ORAL_TABLET | Freq: Four times a day (QID) | ORAL | 0 refills | Status: AC | PRN

## 2024-07-31 ENCOUNTER — Telehealth: Payer: Self-pay | Admitting: Adult Health

## 2024-07-31 NOTE — Telephone Encounter (Signed)
 [facility] The order was faxed to [phone], attention Barnie Seip, NP, for signature.

## 2024-08-25 ENCOUNTER — Non-Acute Institutional Stay (SKILLED_NURSING_FACILITY): Payer: Self-pay | Admitting: Adult Health

## 2024-08-25 ENCOUNTER — Encounter: Payer: Self-pay | Admitting: Adult Health

## 2024-08-25 DIAGNOSIS — J410 Simple chronic bronchitis: Secondary | ICD-10-CM | POA: Diagnosis not present

## 2024-08-25 DIAGNOSIS — I10 Essential (primary) hypertension: Secondary | ICD-10-CM | POA: Diagnosis not present

## 2024-08-25 DIAGNOSIS — F01C11 Vascular dementia, severe, with agitation: Secondary | ICD-10-CM

## 2024-08-25 NOTE — Progress Notes (Signed)
 Location:  [facility] Room Number: North/116/D Place of Service:  SNF (31)   CODE STATUS: DNR  Allergies  Allergen Reactions   Remeron  [Mirtazapine ] Other (See Comments)    Unknown reaction, allergy listed on [facility]    Chief Complaint  Patient presents with   Medical Management of Chronic Issues                Dementia with agitation:  Simple chronic bronchitis:  Essential hypertension    HPI:  She is a 89 y.o. long term resident of this facility being seen for the management of her chronic illnesses:Dementia with agitation:  Simple chronic bronchitis:  Essential hypertension. She is getting out of bed daily; her appetite is picking back up; she has lost some weight. Her coccyx wound is slowly resolving.    Past Medical History:  Diagnosis Date   Diverticulosis OCT 2010 TCS   SIGMOID COLON   Esophageal web 10/08/2008   DILATION 16 MM   Gastritis, Helicobacter pylori 10/08/2008   Rx. ABO for 10 days    GERD (gastroesophageal reflux disease)    Hip fracture, left (HCC)    2016   Hyperlipidemia    Hypertension    Restless legs syndrome     Past Surgical History:  Procedure Laterality Date   ABDOMINAL HYSTERECTOMY     COLONOSCOPY  OCT 2010   TORTUOUS, Sml IH, Tics, MULTIPLE SIMPLE ADENOMAS (<6MM)   HIP ARTHROPLASTY Left 11/19/2014   Procedure: LEFT PARTIAL HIP REPLACEMENT;  Surgeon: Taft FORBES Minerva, MD;  Location: [facility];  Service: Orthopedics;  Laterality: Left;   ORIF HIP FRACTURE  04/05/2012   Procedure: OPEN REDUCTION INTERNAL FIXATION HIP;  Surgeon: Taft FORBES Minerva, MD;  Location: [facility];  Service: Orthopedics;  Laterality: Right;   UPPER GASTROINTESTINAL ENDOSCOPY  AUG 2010   SAVARY   VESICOVAGINAL FISTULA CLOSURE W/ TAH  1966   bleeding     Social History   Socioeconomic History   Marital status: Widowed    Spouse name: Not on file   Number of children: Not on file   Years of education: Not on file   Highest education level:  Not on file  Occupational History   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 [employer]    Occupation: retired   Tobacco Use   Smoking status: Never   Smokeless tobacco: Never  Vaping Use   Vaping status: Never Used  Substance and Sexual Activity   Alcohol use: No   Drug use: No   Sexual activity: Not Currently  Other Topics Concern   Not on file  Social History Narrative   Long term resident of [facility]    Social Drivers of Health   Financial Resource Strain: Low Risk  (07/14/2018)   Overall Financial Resource Strain (CARDIA)    Difficulty of Paying Living Expenses: Not hard at all  Food Insecurity: Patient Unable To Answer (07/18/2024)   Hunger Vital Sign    Worried About Programme Researcher, Broadcasting/film/video in the Last Year: Patient unable to answer    Ran Out of Food in the Last Year: Patient unable to answer  Transportation Needs: Patient Unable To Answer (07/18/2024)   PRAPARE - Transportation    Lack of Transportation (Medical): Patient unable to answer    Lack of Transportation (Non-Medical): Patient unable to answer  Physical Activity: Insufficiently Active (07/14/2018)   Exercise Vital Sign    Days of Exercise per Week: 4 days    Minutes of Exercise  per Session: 20 min  Stress: Stress Concern Present (07/14/2018)   [employer] of Occupational Health - Occupational Stress Questionnaire    Feeling of Stress : To some extent  Social Connections: Patient Unable To Answer (07/18/2024)   Social Connection and Isolation Panel    Frequency of Communication with Friends and Family: Patient unable to answer    Frequency of Social Gatherings with Friends and Family: Patient unable to answer    Attends Religious Services: Patient unable to answer    Active Member of Clubs or Organizations: Patient unable to answer    Attends Banker Meetings: Patient unable to answer    Marital Status: Patient unable to answer  Intimate Partner Violence: Patient Unable To Answer  (07/18/2024)   Humiliation, Afraid, Rape, and Kick questionnaire    Fear of Current or Ex-Partner: Patient unable to answer    Emotionally Abused: Patient unable to answer    Physically Abused: Patient unable to answer    Sexually Abused: Patient unable to answer   Family History  Problem Relation Age of Onset   Cancer Mother        bladder    Diabetes Mother    Diabetes Father    Emphysema Father    GER disease Brother    Colon cancer Neg Hx    Colon polyps Neg Hx       VITAL SIGNS BP (!) 149/68   Pulse 76   Temp (!) 97.3 F (36.3 C)   Resp 20   Ht 5' 8.7 (1.745 m)   Wt 200 lb 3.2 oz (90.8 kg)   SpO2 97%   BMI 29.82 kg/m   Outpatient Encounter Medications as of 08/25/2024  Medication Sig   acetaminophen  (TYLENOL ) 650 MG CR tablet Take 650 mg by mouth 3 (three) times daily.   antiseptic oral rinse (BIOTENE) LIQD 15 mLs by Mouth Rinse route every 8 (eight) hours as needed for dry mouth. Swab mouth with Biotene q shift to aid in maintaining moisture in oral cavity   aspirin  81 MG chewable tablet Chew 81 mg by mouth daily.   Carbidopa-Levodopa ER (SINEMET CR) 25-100 MG tablet controlled release Take 1 tablet by mouth 3 (three) times daily.   FLUoxetine  (PROZAC ) 40 MG capsule Take 40 mg by mouth daily.   levETIRAcetam  (KEPPRA ) 250 MG tablet Take 250 mg by mouth 2 (two) times daily.   liver oil-zinc  oxide (DESITIN) 40 % ointment Apply topically 2 (two) times daily.   Melatonin 5 MG TABS Take 5 mg by mouth at bedtime.   omeprazole  (PRILOSEC) 20 MG capsule Take 20 mg by mouth 2 (two) times daily before a meal.   ondansetron  (ZOFRAN ) 8 MG tablet Take 8 mg by mouth every 6 (six) hours as needed for nausea or vomiting.   sennosides-docusate sodium  (SENOKOT-S) 8.6-50 MG tablet Take 2 tablets by mouth in the morning and at bedtime. For Constipation   traMADol  (ULTRAM ) 50 MG tablet Take 1 tablet (50 mg total) by mouth every 6 (six) hours as needed.   Wound Cleansers (VASHE WOUND)  0.033 % SOLN Apply 1 Application topically daily.   No facility-administered encounter medications on file as of 08/25/2024.     SIGNIFICANT DIAGNOSTIC EXAMS  LABS REVIEWED PREVIOUS    10-04-23: glucose 82; bun 21; creat 0.52; k+ 3.8; na++ 137; ca 8.5; gfr >60 10-21-23: wbc 4.5; hgb 12.6; hct 38.8; mcv 92.2 plt 178; glucose 79; bun 22; creat 0.55; k+ 4.1; na++ 137; ca 8.4  gfr >60 protein 6.5 albumin  3.4 tsh 1.873; keppra  8.6   04-27-24: wbc 5.2; hgb 13.4; hct 41.6; mcv 93.5 plt 180; glucose 81; bun 32; creat 0.56; k+ 3.8; na++ 141; ca 8.6; gfr >60 protein 7.2 albumin  3.6 07-09-24: wbc 6.6; hgb 12.4; hct 39.3 mcv 95.3 plt 234; glucose 82; bun 27; creat 0.55; k+ 3.9 na++ 147; ca 8.5 gfr >60; protein 6.8 albumin  3.4 07-17-24: wbc 26.1; hgb 12.1; hct 37.6; mcv 93.8 plt 217; glucose 106; bun 42; creat 2.22; k+ 5.2; na++ 137; ca 9.1 gfr 21; protein 7.1 albumin  3.5 blood culture: proteus mirabilis 07-20-24: pre-albumin  10 07-24-24: wbc 16.4; hgb 11.6; hct 36.1 mcv 92.3 plt 197; glucose 92; bun 13; creat 0.58; k+ 3.5; na++ 141; ca 8.4; gfr >60 protein 6.5 albumin  3.2   NO NEW LABS.   Review of Systems  Unable to perform ROS: Dementia    Physical Exam Constitutional:      General: She is not in acute distress.    Appearance: She is well-developed. She is obese. She is not diaphoretic.  Neck:     Thyroid : No thyromegaly.  Cardiovascular:     Rate and Rhythm: Normal rate and regular rhythm.     Heart sounds: Normal heart sounds.  Pulmonary:     Effort: Pulmonary effort is normal. No respiratory distress.     Breath sounds: Normal breath sounds.  Abdominal:     General: Bowel sounds are normal. There is no distension.     Palpations: Abdomen is soft.     Tenderness: There is no abdominal tenderness.  Musculoskeletal:     Cervical back: Neck supple.     Right lower leg: No edema.     Left lower leg: No edema.  Lymphadenopathy:     Cervical: No cervical adenopathy.  Skin:    General:  Skin is warm and dry.     Comments: Coccyx: 2.5 x 2.0 x 1.0 cm   Neurological:     Mental Status: She is alert. Mental status is at baseline.  Psychiatric:        Mood and Affect: Mood normal.          ASSESSMENT/ PLAN:  TODAY:   Dementia with agitation: weight is 200 pounds; is presently off medications;   2. Simple chronic bronchitis: without flares: will continue to monitor her status; is not on advair as she is unable to use this inhaler.   3. Essential hypertension: b/p 149/68 is presently off medications.   PREVIOUS   4. Chronic constipation: will continue senna s twice daily   5. Hypokalemia: k+ 3.5; will monitor  6. Hallucination and delusion/agitation due to dementia: her mood state is stable no longer on rexulti  7. Unspecified hyperlipidemia: ldl 127 is off statins due to advanced age.   8. Unspecified obsessive compulsive disorder: will continue prozac  40 mg daily (failed wean X 2)   9. Morbid obesity: BMI 29.28 has lost weight.   10. Parkinson's disease: is without change will continue sinemet cr 25/100 mg three times daily   11. Chronic pain syndrome: will continue tylenol  650 mg four times daily   12. Seizure disorder: (after CHI) no recent activity: will continue keppra  250 mg twice daily   13. Thrombocytopenia: plt 197          Barnie Seip NP [facility]  call [phone]

## 2024-10-03 ENCOUNTER — Other Ambulatory Visit (HOSPITAL_COMMUNITY)
Admission: RE | Admit: 2024-10-03 | Discharge: 2024-10-03 | Disposition: A | Source: Skilled Nursing Facility | Attending: Adult Health | Admitting: Adult Health

## 2024-10-03 DIAGNOSIS — I10 Essential (primary) hypertension: Secondary | ICD-10-CM | POA: Diagnosis present

## 2024-10-03 LAB — BASIC METABOLIC PANEL WITH GFR
Anion gap: 5 (ref 5–15)
BUN: 22 mg/dL (ref 8–23)
CO2: 33 mmol/L — ABNORMAL HIGH (ref 22–32)
Calcium: 8.3 mg/dL — ABNORMAL LOW (ref 8.9–10.3)
Chloride: 105 mmol/L (ref 98–111)
Creatinine, Ser: 0.57 mg/dL (ref 0.44–1.00)
GFR, Estimated: >60 mL/min
Glucose, Bld: 76 mg/dL (ref 70–99)
Potassium: 3.5 mmol/L (ref 3.5–5.1)
Sodium: 143 mmol/L (ref 135–145)

## 2024-10-05 ENCOUNTER — Encounter: Payer: Self-pay | Admitting: Adult Health

## 2024-10-05 ENCOUNTER — Non-Acute Institutional Stay (SKILLED_NURSING_FACILITY): Payer: Self-pay | Admitting: Adult Health

## 2024-10-05 DIAGNOSIS — F22 Delusional disorders: Secondary | ICD-10-CM | POA: Diagnosis not present

## 2024-10-05 DIAGNOSIS — F03911 Unspecified dementia, unspecified severity, with agitation: Secondary | ICD-10-CM

## 2024-10-05 DIAGNOSIS — K5909 Other constipation: Secondary | ICD-10-CM | POA: Diagnosis not present

## 2024-10-05 NOTE — Progress Notes (Signed)
 " Location:  [facility] Room Number: 116 Place of Service:  SNF (31)   CODE STATUS: dnr  Allergies[1]  Chief Complaint  Patient presents with   Medical Management of Chronic Issues         Chronic constipation:  Hypokalemia: Hallucination and delusion/agitation due to dementia:    HPI:  She is a 89 y.o. long term resident of this facility being seen for the management of her chronic illnesses:Chronic constipation:  Hypokalemia: Hallucination and delusion/agitation due to dementia. There are no reports of uncontrolled pain. She continues to be followed by hospice care. Her wound is slowly improving. She has lost some weight. Her current weight is 193 pounds.    Past Medical History:  Diagnosis Date   Diverticulosis OCT 2010 TCS   SIGMOID COLON   Esophageal web 10/08/2008   DILATION 16 MM   Gastritis, Helicobacter pylori 10/08/2008   Rx. ABO for 10 days    GERD (gastroesophageal reflux disease)    Hip fracture, left (HCC)    2016   Hyperlipidemia    Hypertension    Restless legs syndrome     Past Surgical History:  Procedure Laterality Date   ABDOMINAL HYSTERECTOMY     COLONOSCOPY  OCT 2010   TORTUOUS, Sml IH, Tics, MULTIPLE SIMPLE ADENOMAS (<6MM)   HIP ARTHROPLASTY Left 11/19/2014   Procedure: LEFT PARTIAL HIP REPLACEMENT;  Surgeon: Taft FORBES Minerva, MD;  Location: [facility];  Service: Orthopedics;  Laterality: Left;   ORIF HIP FRACTURE  04/05/2012   Procedure: OPEN REDUCTION INTERNAL FIXATION HIP;  Surgeon: Taft FORBES Minerva, MD;  Location: [facility];  Service: Orthopedics;  Laterality: Right;   UPPER GASTROINTESTINAL ENDOSCOPY  AUG 2010   SAVARY   VESICOVAGINAL FISTULA CLOSURE W/ TAH  1966   bleeding     Social History   Socioeconomic History   Marital status: Widowed    Spouse name: Not on file   Number of children: Not on file   Years of education: Not on file   Highest education level: Not on file  Occupational History   Occupation:  custodian / nutritionist -Estate Agent    Occupation: retired   Tobacco Use   Smoking status: Never   Smokeless tobacco: Never  Vaping Use   Vaping status: Never Used  Substance and Sexual Activity   Alcohol use: No   Drug use: No   Sexual activity: Not Currently  Other Topics Concern   Not on file  Social History Narrative   Long term resident of [facility]    Social Drivers of Health   Tobacco Use: Low Risk (10/05/2024)   Patient History    Smoking Tobacco Use: Never    Smokeless Tobacco Use: Never    Passive Exposure: Not on file  Financial Resource Strain: Not on file  Food Insecurity: Patient Unable To Answer (07/18/2024)   Epic    Worried About Programme Researcher, Broadcasting/film/video in the Last Year: Patient unable to answer    Ran Out of Food in the Last Year: Patient unable to answer  Transportation Needs: Patient Unable To Answer (07/18/2024)   Epic    Lack of Transportation (Medical): Patient unable to answer    Lack of Transportation (Non-Medical): Patient unable to answer  Physical Activity: Not on file  Stress: Not on file  Social Connections: Patient Unable To Answer (07/18/2024)   Social Connection and Isolation Panel    Frequency of Communication with Friends and Family: Patient unable to  answer    Frequency of Social Gatherings with Friends and Family: Patient unable to answer    Attends Religious Services: Patient unable to answer    Active Member of Clubs or Organizations: Patient unable to answer    Attends Club or Organization Meetings: Patient unable to answer    Marital Status: Patient unable to answer  Intimate Partner Violence: Patient Unable To Answer (07/18/2024)   Epic    Fear of Current or Ex-Partner: Patient unable to answer    Emotionally Abused: Patient unable to answer    Physically Abused: Patient unable to answer    Sexually Abused: Patient unable to answer  Depression (PHQ2-9): Low Risk (01/31/2023)   Depression (PHQ2-9)    PHQ-2 Score: 0  Alcohol  Screen: Not on file  Housing: Patient Unable To Answer (07/18/2024)   Epic    Unable to Pay for Housing in the Last Year: Patient unable to answer    Number of Times Moved in the Last Year: Not on file    Homeless in the Last Year: Patient unable to answer  Utilities: Patient Unable To Answer (07/18/2024)   Epic    Threatened with loss of utilities: Patient unable to answer  Health Literacy: Not on file   Family History  Problem Relation Age of Onset   Cancer Mother        bladder    Diabetes Mother    Diabetes Father    Emphysema Father    GER disease Brother    Colon cancer Neg Hx    Colon polyps Neg Hx       VITAL SIGNS BP (!) 125/57   Pulse 69   Temp (!) 97.4 F (36.3 C)   Resp 20   Ht 5' 8 (1.727 m)   Wt 193 lb 12.8 oz (87.9 kg)   SpO2 98%   BMI 29.47 kg/m   Outpatient Encounter Medications as of 10/05/2024  Medication Sig   acetaminophen  (TYLENOL ) 650 MG CR tablet Take 650 mg by mouth 3 (three) times daily.   antiseptic oral rinse (BIOTENE) LIQD 15 mLs by Mouth Rinse route every 8 (eight) hours as needed for dry mouth. Swab mouth with Biotene q shift to aid in maintaining moisture in oral cavity   aspirin  81 MG chewable tablet Chew 81 mg by mouth daily.   Carbidopa-Levodopa ER (SINEMET CR) 25-100 MG tablet controlled release Take 1 tablet by mouth 3 (three) times daily.   FLUoxetine  (PROZAC ) 40 MG capsule Take 40 mg by mouth daily.   levETIRAcetam  (KEPPRA ) 250 MG tablet Take 250 mg by mouth 2 (two) times daily.   liver oil-zinc  oxide (DESITIN) 40 % ointment Apply topically 2 (two) times daily.   Melatonin 5 MG TABS Take 5 mg by mouth at bedtime.   omeprazole  (PRILOSEC) 20 MG capsule Take 20 mg by mouth 2 (two) times daily before a meal.   ondansetron  (ZOFRAN ) 8 MG tablet Take 8 mg by mouth every 6 (six) hours as needed for nausea or vomiting.   sennosides-docusate sodium  (SENOKOT-S) 8.6-50 MG tablet Take 2 tablets by mouth in the morning and at bedtime. For  Constipation   traMADol  (ULTRAM ) 50 MG tablet Take 1 tablet (50 mg total) by mouth every 6 (six) hours as needed.   Wound Cleansers (VASHE WOUND) 0.033 % SOLN Apply 1 Application topically daily.   No facility-administered encounter medications on file as of 10/05/2024.     SIGNIFICANT DIAGNOSTIC EXAMS  LABS REVIEWED PREVIOUS  10-04-23: glucose 82; bun 21; creat 0.52; k+ 3.8; na++ 137; ca 8.5; gfr >60 10-21-23: wbc 4.5; hgb 12.6; hct 38.8; mcv 92.2 plt 178; glucose 79; bun 22; creat 0.55; k+ 4.1; na++ 137; ca 8.4 gfr >60 protein 6.5 albumin  3.4 tsh 1.873; keppra  8.6   04-27-24: wbc 5.2; hgb 13.4; hct 41.6; mcv 93.5 plt 180; glucose 81; bun 32; creat 0.56; k+ 3.8; na++ 141; ca 8.6; gfr >60 protein 7.2 albumin  3.6 07-09-24: wbc 6.6; hgb 12.4; hct 39.3 mcv 95.3 plt 234; glucose 82; bun 27; creat 0.55; k+ 3.9 na++ 147; ca 8.5 gfr >60; protein 6.8 albumin  3.4 07-17-24: wbc 26.1; hgb 12.1; hct 37.6; mcv 93.8 plt 217; glucose 106; bun 42; creat 2.22; k+ 5.2; na++ 137; ca 9.1 gfr 21; protein 7.1 albumin  3.5 blood culture: proteus mirabilis 07-20-24: pre-albumin  10 07-24-24: wbc 16.4; hgb 11.6; hct 36.1 mcv 92.3 plt 197; glucose 92; bun 13; creat 0.58; k+ 3.5; na++ 141; ca 8.4; gfr >60 protein 6.5 albumin  3.2   NO NEW LABS.   Review of Systems  Unable to perform ROS: Dementia    Physical Exam Constitutional:      General: She is not in acute distress.    Appearance: She is well-developed. She is obese. She is not diaphoretic.  Neck:     Thyroid : No thyromegaly.  Cardiovascular:     Rate and Rhythm: Normal rate and regular rhythm.     Heart sounds: Normal heart sounds.  Pulmonary:     Effort: Pulmonary effort is normal. No respiratory distress.     Breath sounds: Normal breath sounds.  Abdominal:     General: Bowel sounds are normal. There is no distension.     Palpations: Abdomen is soft.     Tenderness: There is no abdominal tenderness.  Musculoskeletal:     Cervical back: Neck  supple.     Right lower leg: No edema.     Left lower leg: No edema.  Lymphadenopathy:     Cervical: No cervical adenopathy.  Skin:    General: Skin is warm and dry.     Comments: Coccyx: 2.4 x 2 x 0.6 cm   Neurological:     Mental Status: She is alert. Mental status is at baseline.  Psychiatric:        Mood and Affect: Mood normal.         ASSESSMENT/ PLAN:  TODAY:   Chronic constipation: will continue senna s twice daily   2. Hypokalemia: k+ 3.5 will monitor  3. Hallucination and delusion/agitation due to dementia: her mood state is stable is off rexulti    PREVIOUS   4. Unspecified hyperlipidemia: ldl 127 is off statins due to advanced age.   5. Unspecified obsessive compulsive disorder: will continue prozac  40 mg daily (failed wean X 2)   6. Morbid obesity: BMI 29.47 has lost weight.   7. Parkinson's disease: is without change will continue sinemet cr 25/100 mg three times daily   8. Chronic pain syndrome: will continue tylenol  650 mg four times daily   9. Seizure disorder: (after CHI) no recent activity: will continue keppra  250 mg twice daily   10. Thrombocytopenia: plt 197   11. Dementia with agitation: weight is 193 pounds; is presently off medications;   12. Simple chronic bronchitis: without flares: will continue to monitor her status; is not on advair as she is unable to use this inhaler.   13. Essential hypertension: b/p 125/57 is presently off medications.  Barnie Seip NP [facility]   call [phone]      [1]  Allergies Allergen Reactions   Remeron  [Mirtazapine ] Other (See Comments)    Unknown reaction, allergy listed on MAR   "

## 2024-11-05 ENCOUNTER — Non-Acute Institutional Stay (SKILLED_NURSING_FACILITY): Payer: Self-pay | Admitting: Adult Health

## 2024-11-05 DIAGNOSIS — E669 Obesity, unspecified: Secondary | ICD-10-CM

## 2024-11-05 DIAGNOSIS — F422 Mixed obsessional thoughts and acts: Secondary | ICD-10-CM

## 2024-11-05 DIAGNOSIS — E785 Hyperlipidemia, unspecified: Secondary | ICD-10-CM | POA: Diagnosis not present

## 2024-11-05 NOTE — Progress Notes (Signed)
 " Location:  [facility] Room Number: 116 Place of Service:  SNF (31)   CODE STATUS: dnr  Allergies[1]  Chief Complaint  Patient presents with   Medical Management of Chronic Issues       Unspecified hyperlipidemia: Unspecified obsessive compulsive disorder: Morbid obesity:    HPI:  She is a 89 y.o. long term resident of this facility being seen for the management of her chronic illnesses: Unspecified hyperlipidemia: Unspecified obsessive compulsive disorder: Morbid obesity. There are no reports of uncontrolled pain. She continues to slowly lose weight.    Past Medical History:  Diagnosis Date   Diverticulosis OCT 2010 TCS   SIGMOID COLON   Esophageal web 10/08/2008   DILATION 16 MM   Gastritis, Helicobacter pylori 10/08/2008   Rx. ABO for 10 days    GERD (gastroesophageal reflux disease)    Hip fracture, left (HCC)    2016   Hyperlipidemia    Hypertension    Restless legs syndrome     Past Surgical History:  Procedure Laterality Date   ABDOMINAL HYSTERECTOMY     COLONOSCOPY  OCT 2010   TORTUOUS, Sml IH, Tics, MULTIPLE SIMPLE ADENOMAS (<6MM)   HIP ARTHROPLASTY Left 11/19/2014   Procedure: LEFT PARTIAL HIP REPLACEMENT;  Surgeon: Taft FORBES Minerva, MD;  Location: [facility];  Service: Orthopedics;  Laterality: Left;   ORIF HIP FRACTURE  04/05/2012   Procedure: OPEN REDUCTION INTERNAL FIXATION HIP;  Surgeon: Taft FORBES Minerva, MD;  Location: [facility];  Service: Orthopedics;  Laterality: Right;   UPPER GASTROINTESTINAL ENDOSCOPY  AUG 2010   SAVARY   VESICOVAGINAL FISTULA CLOSURE W/ TAH  1966   bleeding     Social History   Socioeconomic History   Marital status: Widowed    Spouse name: Not on file   Number of children: Not on file   Years of education: Not on file   Highest education level: Not on file  Occupational History   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 Agent    Occupation: retired   Tobacco Use   Smoking status: Never    Smokeless tobacco: Never  Vaping Use   Vaping status: Never Used  Substance and Sexual Activity   Alcohol use: No   Drug use: No   Sexual activity: Not Currently  Other Topics Concern   Not on file  Social History Narrative   Long term resident of [facility]    Social Drivers of Health   Tobacco Use: Low Risk (10/05/2024)   Patient History    Smoking Tobacco Use: Never    Smokeless Tobacco Use: Never    Passive Exposure: Not on file  Financial Resource Strain: Not on file  Food Insecurity: Patient Unable To Answer (07/18/2024)   Epic    Worried About Programme Researcher, Broadcasting/film/video in the Last Year: Patient unable to answer    Ran Out of Food in the Last Year: Patient unable to answer  Transportation Needs: Patient Unable To Answer (07/18/2024)   Epic    Lack of Transportation (Medical): Patient unable to answer    Lack of Transportation (Non-Medical): Patient unable to answer  Physical Activity: Not on file  Stress: Not on file  Social Connections: Patient Unable To Answer (07/18/2024)   Social Connection and Isolation Panel    Frequency of Communication with Friends and Family: Patient unable to answer    Frequency of Social Gatherings with Friends and Family: Patient unable to answer    Attends Religious Services: Patient  unable to answer    Active Member of Clubs or Organizations: Patient unable to answer    Attends Club or Organization Meetings: Patient unable to answer    Marital Status: Patient unable to answer  Intimate Partner Violence: Patient Unable To Answer (07/18/2024)   Epic    Fear of Current or Ex-Partner: Patient unable to answer    Emotionally Abused: Patient unable to answer    Physically Abused: Patient unable to answer    Sexually Abused: Patient unable to answer  Depression (PHQ2-9): Low Risk (01/31/2023)   Depression (PHQ2-9)    PHQ-2 Score: 0  Alcohol Screen: Not on file  Housing: Patient Unable To Answer (07/18/2024)   Epic    Unable to Pay for Housing in  the Last Year: Patient unable to answer    Number of Times Moved in the Last Year: Not on file    Homeless in the Last Year: Patient unable to answer  Utilities: Patient Unable To Answer (07/18/2024)   Epic    Threatened with loss of utilities: Patient unable to answer  Health Literacy: Not on file   Family History  Problem Relation Age of Onset   Cancer Mother        bladder    Diabetes Mother    Diabetes Father    Emphysema Father    GER disease Brother    Colon cancer Neg Hx    Colon polyps Neg Hx       VITAL SIGNS BP 117/61   Pulse (!) 50   Temp 99.1 F (37.3 C)   Resp 18   Ht 5' 8 (1.727 m)   Wt 191 lb 8 oz (86.9 kg)   SpO2 95%   BMI 29.12 kg/m   Outpatient Encounter Medications as of 11/05/2024  Medication Sig   acetaminophen  (TYLENOL ) 650 MG CR tablet Take 650 mg by mouth 3 (three) times daily.   antiseptic oral rinse (BIOTENE) LIQD 15 mLs by Mouth Rinse route every 8 (eight) hours as needed for dry mouth. Swab mouth with Biotene q shift to aid in maintaining moisture in oral cavity   aspirin  81 MG chewable tablet Chew 81 mg by mouth daily.   Carbidopa-Levodopa ER (SINEMET CR) 25-100 MG tablet controlled release Take 1 tablet by mouth 3 (three) times daily.   FLUoxetine  (PROZAC ) 40 MG capsule Take 40 mg by mouth daily.   levETIRAcetam  (KEPPRA ) 250 MG tablet Take 250 mg by mouth 2 (two) times daily.   liver oil-zinc  oxide (DESITIN) 40 % ointment Apply topically 2 (two) times daily.   Melatonin 5 MG TABS Take 5 mg by mouth at bedtime.   omeprazole  (PRILOSEC) 20 MG capsule Take 20 mg by mouth 2 (two) times daily before a meal.   ondansetron  (ZOFRAN ) 8 MG tablet Take 8 mg by mouth every 6 (six) hours as needed for nausea or vomiting.   sennosides-docusate sodium  (SENOKOT-S) 8.6-50 MG tablet Take 2 tablets by mouth in the morning and at bedtime. For Constipation   traMADol  (ULTRAM ) 50 MG tablet Take 1 tablet (50 mg total) by mouth every 6 (six) hours as needed.    Wound Cleansers (VASHE WOUND) 0.033 % SOLN Apply 1 Application topically daily.   No facility-administered encounter medications on file as of 11/05/2024.     SIGNIFICANT DIAGNOSTIC EXAMS  LABS REVIEWED PREVIOUS    10-04-23: glucose 82; bun 21; creat 0.52; k+ 3.8; na++ 137; ca 8.5; gfr >60 10-21-23: wbc 4.5; hgb 12.6; hct 38.8; mcv 92.2  plt 178; glucose 79; bun 22; creat 0.55; k+ 4.1; na++ 137; ca 8.4 gfr >60 protein 6.5 albumin  3.4 tsh 1.873; keppra  8.6   04-27-24: wbc 5.2; hgb 13.4; hct 41.6; mcv 93.5 plt 180; glucose 81; bun 32; creat 0.56; k+ 3.8; na++ 141; ca 8.6; gfr >60 protein 7.2 albumin  3.6 07-09-24: wbc 6.6; hgb 12.4; hct 39.3 mcv 95.3 plt 234; glucose 82; bun 27; creat 0.55; k+ 3.9 na++ 147; ca 8.5 gfr >60; protein 6.8 albumin  3.4 07-17-24: wbc 26.1; hgb 12.1; hct 37.6; mcv 93.8 plt 217; glucose 106; bun 42; creat 2.22; k+ 5.2; na++ 137; ca 9.1 gfr 21; protein 7.1 albumin  3.5 blood culture: proteus mirabilis 07-20-24: pre-albumin  10 07-24-24: wbc 16.4; hgb 11.6; hct 36.1 mcv 92.3 plt 197; glucose 92; bun 13; creat 0.58; k+ 3.5; na++ 141; ca 8.4; gfr >60 protein 6.5 albumin  3.2   NO NEW LABS.   Review of Systems  Unable to perform ROS: Dementia     Physical Exam Constitutional:      General: She is not in acute distress.    Appearance: She is well-developed. She is obese. She is not diaphoretic.  Neck:     Thyroid : No thyromegaly.  Cardiovascular:     Rate and Rhythm: Normal rate and regular rhythm.     Heart sounds: Normal heart sounds.  Pulmonary:     Effort: Pulmonary effort is normal. No respiratory distress.     Breath sounds: Normal breath sounds.  Abdominal:     General: Bowel sounds are normal. There is no distension.     Palpations: Abdomen is soft.     Tenderness: There is no abdominal tenderness.  Musculoskeletal:     Cervical back: Neck supple.     Right lower leg: No edema.     Left lower leg: No edema.  Lymphadenopathy:     Cervical: No cervical  adenopathy.  Skin:    General: Skin is warm and dry.     Comments: Coccyx: 1.4 x 1 x 0.2 cm stage 4   Neurological:     Mental Status: She is alert. Mental status is at baseline.  Psychiatric:        Mood and Affect: Mood normal.         ASSESSMENT/ PLAN:  TODAY:   Unspecified hyperlipidemia: ldl 127 is off statin is followed by hospice care  2. Unspecified obsessive compulsive disorder: will continue prozac  40 mg daily (failed 2 weans)  3. Morbid obesity: BMI 29.12 weight is 191 pounds; has lost weight.   PREVIOUS    4. Parkinson's disease: is without change will continue sinemet cr 25/100 mg three times daily   5. Chronic pain syndrome: will continue tylenol  650 mg four times daily   6. Seizure disorder: (after CHI) no recent activity: will continue keppra  250 mg twice daily   7. Thrombocytopenia: plt 197   8. Dementia with agitation: weight is 193 pounds; is presently off medications;   9. Simple chronic bronchitis: without flares: will continue to monitor her status; is not on advair as she is unable to use this inhaler.   10. Essential hypertension: b/p 117/61 is presently off medications.   11. Chronic constipation: will continue senna s twice daily   12. Hypokalemia: k+ 3.5 will monitor  13. Hallucination and delusion/agitation due to dementia: her mood state is stable is off rexulti      Barnie Seip NP [facility]   call [phone]      [1]  Allergies Allergen Reactions   Remeron  [Mirtazapine ] Other (See Comments)    Unknown reaction, allergy listed on MAR   "

## 2024-11-06 ENCOUNTER — Non-Acute Institutional Stay (SKILLED_NURSING_FACILITY): Payer: Self-pay | Admitting: Adult Health

## 2024-11-06 DIAGNOSIS — I10 Essential (primary) hypertension: Secondary | ICD-10-CM | POA: Diagnosis not present

## 2024-11-06 DIAGNOSIS — F01C11 Vascular dementia, severe, with agitation: Secondary | ICD-10-CM

## 2024-11-06 DIAGNOSIS — G40909 Epilepsy, unspecified, not intractable, without status epilepticus: Secondary | ICD-10-CM

## 2024-11-06 NOTE — Progress Notes (Unsigned)
 " Location:  [facility] Room Number: 111 Place of Service:  SNF (31)   CODE STATUS: dnr   Allergies[1]  Chief Complaint  Patient presents with   Acute Visit    Care plan meeting.     HPI:  We have come together for her care plan meeting. BIMS 5/15 mood 9/30: not sleeping well; poor energy; nervous. She uses wheelchair with multiple falls without injury. 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 She requires dependent assist with her adl care. She is incontinent of bowel; has foley. Dietary: setup for meals; D1 with double portions; appetite 26-100%. Weight is 192 and is stable. Therapy: st: to upgrade diet. Activities: does participate. She will continue to be followed for her chronic illnesses including:  Essential hypertension   Severe vascular dementia with agitation   Seizure disorder  Past Medical History:  Diagnosis Date   Diverticulosis OCT 2010 TCS   SIGMOID COLON   Esophageal web 10/08/2008   DILATION 16 MM   Gastritis, Helicobacter pylori 10/08/2008   Rx. ABO for 10 days    GERD (gastroesophageal reflux disease)    Hip fracture, left (HCC)    2016   Hyperlipidemia    Hypertension    Restless legs syndrome     Past Surgical History:  Procedure Laterality Date   ABDOMINAL HYSTERECTOMY     COLONOSCOPY  OCT 2010   TORTUOUS, Sml IH, Tics, MULTIPLE SIMPLE ADENOMAS (<6MM)   HIP ARTHROPLASTY Left 11/19/2014   Procedure: LEFT PARTIAL HIP REPLACEMENT;  Surgeon: Taft FORBES Minerva, MD;  Location: [facility];  Service: Orthopedics;  Laterality: Left;   ORIF HIP FRACTURE  04/05/2012   Procedure: OPEN REDUCTION INTERNAL FIXATION HIP;  Surgeon: Taft FORBES Minerva, MD;  Location: [facility];  Service: Orthopedics;  Laterality: Right;   UPPER GASTROINTESTINAL ENDOSCOPY  AUG 2010   SAVARY   VESICOVAGINAL FISTULA CLOSURE W/ TAH  1966   bleeding     Social History   Socioeconomic History   Marital status: Widowed    Spouse name: Not on file   Number of  children: Not on file   Years of education: Not on file   Highest education level: Not on file  Occupational History   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 [employer]    Occupation: retired   Tobacco Use   Smoking status: Never   Smokeless tobacco: Never  Vaping Use   Vaping status: Never Used  Substance and Sexual Activity   Alcohol use: No   Drug use: No   Sexual activity: Not Currently  Other Topics Concern   Not on file  Social History Narrative   Long term resident of [facility]    Social Drivers of Health   Tobacco Use: Low Risk (10/05/2024)   Patient History    Smoking Tobacco Use: Never    Smokeless Tobacco Use: Never    Passive Exposure: Not on file  Financial Resource Strain: Not on file  Food Insecurity: Patient Unable To Answer (07/18/2024)   Epic    Worried About Programme Researcher, Broadcasting/film/video in the Last Year: Patient unable to answer    Ran Out of Food in the Last Year: Patient unable to answer  Transportation Needs: Patient Unable To Answer (07/18/2024)   Epic    Lack of Transportation (Medical): Patient unable to answer    Lack of Transportation (Non-Medical): Patient unable to answer  Physical Activity: Not on file  Stress: Not on file  Social  Connections: Patient Unable To Answer (07/18/2024)   Social Connection and Isolation Panel    Frequency of Communication with Friends and Family: Patient unable to answer    Frequency of Social Gatherings with Friends and Family: Patient unable to answer    Attends Religious Services: Patient unable to answer    Active Member of Clubs or Organizations: Patient unable to answer    Attends Banker Meetings: Patient unable to answer    Marital Status: Patient unable to answer  Intimate Partner Violence: Patient Unable To Answer (07/18/2024)   Epic    Fear of Current or Ex-Partner: Patient unable to answer    Emotionally Abused: Patient unable to answer    Physically Abused: Patient unable to answer     Sexually Abused: Patient unable to answer  Depression (PHQ2-9): Low Risk (01/31/2023)   Depression (PHQ2-9)    PHQ-2 Score: 0  Alcohol Screen: Not on file  Housing: Patient Unable To Answer (07/18/2024)   Epic    Unable to Pay for Housing in the Last Year: Patient unable to answer    Number of Times Moved in the Last Year: Not on file    Homeless in the Last Year: Patient unable to answer  Utilities: Patient Unable To Answer (07/18/2024)   Epic    Threatened with loss of utilities: Patient unable to answer  Health Literacy: Not on file   Family History  Problem Relation Age of Onset   Cancer Mother        bladder    Diabetes Mother    Diabetes Father    Emphysema Father    GER disease Brother    Colon cancer Neg Hx    Colon polyps Neg Hx       VITAL SIGNS BP 122/74   Pulse 66   Temp 99.1 F (37.3 C)   Resp 18   Ht 5' 8 (1.727 m)   Wt 191 lb 8 oz (86.9 kg)   SpO2 95%   BMI 29.12 kg/m   Outpatient Encounter Medications as of 11/06/2024  Medication Sig   acetaminophen  (TYLENOL ) 650 MG CR tablet Take 650 mg by mouth 3 (three) times daily.   antiseptic oral rinse (BIOTENE) LIQD 15 mLs by Mouth Rinse route every 8 (eight) hours as needed for dry mouth. Swab mouth with Biotene q shift to aid in maintaining moisture in oral cavity   aspirin  81 MG chewable tablet Chew 81 mg by mouth daily.   Carbidopa-Levodopa ER (SINEMET CR) 25-100 MG tablet controlled release Take 1 tablet by mouth 3 (three) times daily.   FLUoxetine  (PROZAC ) 40 MG capsule Take 40 mg by mouth daily.   levETIRAcetam  (KEPPRA ) 250 MG tablet Take 250 mg by mouth 2 (two) times daily.   liver oil-zinc  oxide (DESITIN) 40 % ointment Apply topically 2 (two) times daily.   Melatonin 5 MG TABS Take 5 mg by mouth at bedtime.   omeprazole  (PRILOSEC) 20 MG capsule Take 20 mg by mouth 2 (two) times daily before a meal.   ondansetron  (ZOFRAN ) 8 MG tablet Take 8 mg by mouth every 6 (six) hours as needed for nausea or  vomiting.   sennosides-docusate sodium  (SENOKOT-S) 8.6-50 MG tablet Take 2 tablets by mouth in the morning and at bedtime. For Constipation   traMADol  (ULTRAM ) 50 MG tablet Take 1 tablet (50 mg total) by mouth every 6 (six) hours as needed.   Wound Cleansers (VASHE WOUND) 0.033 % SOLN Apply 1 Application topically daily.  No facility-administered encounter medications on file as of 11/06/2024.     SIGNIFICANT DIAGNOSTIC EXAMS  LABS REVIEWED PREVIOUS    10-04-23: glucose 82; bun 21; creat 0.52; k+ 3.8; na++ 137; ca 8.5; gfr >60 10-21-23: wbc 4.5; hgb 12.6; hct 38.8; mcv 92.2 plt 178; glucose 79; bun 22; creat 0.55; k+ 4.1; na++ 137; ca 8.4 gfr >60 protein 6.5 albumin  3.4 tsh 1.873; keppra  8.6   04-27-24: wbc 5.2; hgb 13.4; hct 41.6; mcv 93.5 plt 180; glucose 81; bun 32; creat 0.56; k+ 3.8; na++ 141; ca 8.6; gfr >60 protein 7.2 albumin  3.6 07-09-24: wbc 6.6; hgb 12.4; hct 39.3 mcv 95.3 plt 234; glucose 82; bun 27; creat 0.55; k+ 3.9 na++ 147; ca 8.5 gfr >60; protein 6.8 albumin  3.4 07-17-24: wbc 26.1; hgb 12.1; hct 37.6; mcv 93.8 plt 217; glucose 106; bun 42; creat 2.22; k+ 5.2; na++ 137; ca 9.1 gfr 21; protein 7.1 albumin  3.5 blood culture: proteus mirabilis 07-20-24: pre-albumin  10 07-24-24: wbc 16.4; hgb 11.6; hct 36.1 mcv 92.3 plt 197; glucose 92; bun 13; creat 0.58; k+ 3.5; na++ 141; ca 8.4; gfr >60 protein 6.5 albumin  3.2   NO NEW LABS.   Review of Systems  Unable to perform ROS: Dementia    Physical Exam Constitutional:      General: She is not in acute distress.    Appearance: She is well-developed. She is obese. She is not diaphoretic.  Neck:     Thyroid : No thyromegaly.  Cardiovascular:     Rate and Rhythm: Normal rate and regular rhythm.     Heart sounds: Normal heart sounds.  Pulmonary:     Effort: Pulmonary effort is normal. No respiratory distress.     Breath sounds: Normal breath sounds.  Abdominal:     General: Bowel sounds are normal. There is no distension.      Palpations: Abdomen is soft.     Tenderness: There is no abdominal tenderness.  Musculoskeletal:        General: Normal range of motion.     Cervical back: Neck supple.     Right lower leg: No edema.     Left lower leg: No edema.  Lymphadenopathy:     Cervical: No cervical adenopathy.  Skin:    General: Skin is warm and dry.     Comments: Coccyx: 1.4 x 1 x 0.2 cm stage 4    Neurological:     Mental Status: She is alert. Mental status is at baseline.  Psychiatric:        Mood and Affect: Mood normal.       ASSESSMENT/ PLAN:  TODAY  1. Essential hypertension 2. Severe vascular dementia with agitation 3. Seizure disorder  Will continue current medications Will continue current plan of care Will continue to be followed by hospice care Will continue to monitor her status   Time spent with patient: 40 minutes: medications; dietary; plan of care.    Barnie Seip NP [facility]  call [phone]      [1]  Allergies Allergen Reactions   Remeron  [Mirtazapine ] Other (See Comments)    Unknown reaction, allergy listed on MAR   "
